# Patient Record
Sex: Male | Born: 1970
Health system: Southern US, Community
[De-identification: ages and names within clinical notes are randomized; demographics above are authoritative.]

## PROBLEM LIST (undated history)

## (undated) DIAGNOSIS — L409 Psoriasis, unspecified: Secondary | ICD-10-CM

## (undated) DIAGNOSIS — R7401 Elevation of levels of liver transaminase levels: Secondary | ICD-10-CM

## (undated) DIAGNOSIS — R17 Unspecified jaundice: Secondary | ICD-10-CM

## (undated) DIAGNOSIS — K7010 Alcoholic hepatitis without ascites: Secondary | ICD-10-CM

## (undated) DIAGNOSIS — K7460 Unspecified cirrhosis of liver: Secondary | ICD-10-CM

## (undated) DIAGNOSIS — I10 Essential (primary) hypertension: Secondary | ICD-10-CM

## (undated) DIAGNOSIS — M1990 Unspecified osteoarthritis, unspecified site: Secondary | ICD-10-CM

## (undated) DIAGNOSIS — D649 Anemia, unspecified: Secondary | ICD-10-CM

## (undated) DIAGNOSIS — R109 Unspecified abdominal pain: Secondary | ICD-10-CM

## (undated) DIAGNOSIS — F419 Anxiety disorder, unspecified: Secondary | ICD-10-CM

## (undated) HISTORY — DX: Psoriasis, unspecified: L40.9

## (undated) HISTORY — DX: Anxiety disorder, unspecified: F41.9

## (undated) HISTORY — DX: Elevation of levels of liver transaminase levels: R74.01

## (undated) HISTORY — PX: COLONOSCOPY: SHX174

## (undated) HISTORY — PX: NO PAST SURGERIES: SHX2092

## (undated) HISTORY — DX: Unspecified osteoarthritis, unspecified site: M19.90

## (undated) HISTORY — DX: Unspecified cirrhosis of liver: K74.60

---

## 2002-04-24 ENCOUNTER — Ambulatory Visit (HOSPITAL_COMMUNITY): Admission: RE | Admit: 2002-04-24 | Discharge: 2002-04-24 | Payer: Self-pay | Admitting: Family Medicine

## 2002-04-24 ENCOUNTER — Encounter: Payer: Self-pay | Admitting: Family Medicine

## 2005-05-22 ENCOUNTER — Emergency Department (HOSPITAL_COMMUNITY): Admission: EM | Admit: 2005-05-22 | Discharge: 2005-05-22 | Payer: Self-pay | Admitting: Emergency Medicine

## 2007-08-12 ENCOUNTER — Ambulatory Visit (HOSPITAL_COMMUNITY): Admission: RE | Admit: 2007-08-12 | Discharge: 2007-08-12 | Payer: Self-pay | Admitting: Family Medicine

## 2012-05-02 ENCOUNTER — Ambulatory Visit (INDEPENDENT_AMBULATORY_CARE_PROVIDER_SITE_OTHER): Payer: BC Managed Care – PPO | Admitting: Family Medicine

## 2012-05-02 ENCOUNTER — Encounter: Payer: Self-pay | Admitting: Family Medicine

## 2012-05-02 VITALS — BP 140/94 | HR 88 | Resp 16 | Ht 69.0 in | Wt 213.0 lb

## 2012-05-02 DIAGNOSIS — L409 Psoriasis, unspecified: Secondary | ICD-10-CM

## 2012-05-02 DIAGNOSIS — L408 Other psoriasis: Secondary | ICD-10-CM

## 2012-05-02 DIAGNOSIS — E669 Obesity, unspecified: Secondary | ICD-10-CM

## 2012-05-02 DIAGNOSIS — F17200 Nicotine dependence, unspecified, uncomplicated: Secondary | ICD-10-CM

## 2012-05-02 DIAGNOSIS — Z720 Tobacco use: Secondary | ICD-10-CM

## 2012-05-02 MED ORDER — CLOBETASOL PROPIONATE 0.05 % EX CREA: TOPICAL_CREAM | Freq: Two times a day (BID) | CUTANEOUS | Status: DC

## 2012-05-02 NOTE — Patient Instructions (Signed)
Referral to Dermatology  Start cream twice a day Schedule a physical for morning appointment- come fasting- 3 months

## 2012-05-04 DIAGNOSIS — Z720 Tobacco use: Secondary | ICD-10-CM | POA: Insufficient documentation

## 2012-05-04 DIAGNOSIS — L409 Psoriasis, unspecified: Secondary | ICD-10-CM | POA: Insufficient documentation

## 2012-05-04 DIAGNOSIS — E669 Obesity, unspecified: Secondary | ICD-10-CM | POA: Insufficient documentation

## 2012-05-04 DIAGNOSIS — L404 Guttate psoriasis: Secondary | ICD-10-CM | POA: Insufficient documentation

## 2012-05-04 DIAGNOSIS — L4050 Arthropathic psoriasis, unspecified: Secondary | ICD-10-CM | POA: Insufficient documentation

## 2012-05-04 NOTE — Assessment & Plan Note (Signed)
Discussed need for cessation, he does not want to try meds due to SE, he has tried patches in past, he is thinking of electronic cig

## 2012-05-04 NOTE — Progress Notes (Signed)
  Subjective:    Patient ID: Christopher Carrillo, male    DOB: 1971-03-02, 42 y.o.   MRN: [id number]  HPI Pt here to establish care, previous PCP [facility] No current medications He owns [employer]  Rash- for the past year he has had numerous red and silver patches come up on his body, initially started as a few spots on arm and knees, he then noticed swelling of his hands and feet and soon after he had the rash all over his thighs and buttucks, +itching, and painful at times He was given a fungal pill in the past that did not help, he has used some topical steroids from his mother and another medicine who also has   Not helped very much   Review of Systems  - per above   GEN- denies fatigue, fever, weight loss,weakness, recent illness HEENT- denies eye drainage, change in vision, nasal discharge, CVS- denies chest pain, palpitations RESP- denies SOB, cough, wheeze ABD- denies N/V, change in stools, abd pain GU- denies dysuria, hematuria, dribbling, incontinence MSK- denies joint pain, muscle aches, injury Neuro- denies headache, dizziness, syncope, seizure activity      Objective:   Physical Exam  GEN- NAD, alert and oriented x3 HEENT- PERRL, EOMI, non injected sclera, pink conjunctiva, MMM, oropharynx clear Neck- Supple, no LAD CVS- RRR, no murmur RESP-CTAB ABD-NABS,soft,NT,ND EXT- No edema Skin- Mutiple large erythematous rasied scaly plaques on buttocks, thighs, white pearly appearing plaque bilateral knees, small dime size raised plaques on bilat arms, scalp spared, face spared Pulses- Radial, DP- 2+       Assessment & Plan:

## 2012-05-04 NOTE — Assessment & Plan Note (Signed)
This appears like a a severe psoriasis

## 2012-05-04 NOTE — Assessment & Plan Note (Signed)
The appearance looks like a guttate psoroasis on his buttocks and legs, the knees look more classis with the white plaques, I will give him clobetasol today, refer to dermatology for biopsy and definite diagnosis. He will need CPE with FLP

## 2012-07-31 ENCOUNTER — Ambulatory Visit: Payer: BC Managed Care – PPO | Admitting: Family Medicine

## 2012-08-07 ENCOUNTER — Ambulatory Visit (INDEPENDENT_AMBULATORY_CARE_PROVIDER_SITE_OTHER): Payer: BC Managed Care – PPO | Admitting: Family Medicine

## 2012-08-07 ENCOUNTER — Encounter: Payer: Self-pay | Admitting: Family Medicine

## 2012-08-07 VITALS — BP 140/86 | HR 88 | Resp 16 | Wt 212.1 lb

## 2012-08-07 DIAGNOSIS — Z1211 Encounter for screening for malignant neoplasm of colon: Secondary | ICD-10-CM

## 2012-08-07 DIAGNOSIS — Z125 Encounter for screening for malignant neoplasm of prostate: Secondary | ICD-10-CM

## 2012-08-07 DIAGNOSIS — Z720 Tobacco use: Secondary | ICD-10-CM

## 2012-08-07 DIAGNOSIS — R030 Elevated blood-pressure reading, without diagnosis of hypertension: Secondary | ICD-10-CM

## 2012-08-07 DIAGNOSIS — L409 Psoriasis, unspecified: Secondary | ICD-10-CM

## 2012-08-07 DIAGNOSIS — F17200 Nicotine dependence, unspecified, uncomplicated: Secondary | ICD-10-CM

## 2012-08-07 DIAGNOSIS — Z0000 Encounter for general adult medical examination without abnormal findings: Secondary | ICD-10-CM

## 2012-08-07 DIAGNOSIS — L408 Other psoriasis: Secondary | ICD-10-CM

## 2012-08-07 DIAGNOSIS — E669 Obesity, unspecified: Secondary | ICD-10-CM

## 2012-08-07 LAB — CBC
HCT: 47.3 % (ref 39.0–52.0)
Hemoglobin: 16.7 g/dL (ref 13.0–17.0)
MCH: 32.6 pg (ref 26.0–34.0)
MCV: 92.4 fL (ref 78.0–100.0)
RBC: 5.12 MIL/uL (ref 4.22–5.81)

## 2012-08-07 NOTE — Patient Instructions (Addendum)
I recommend eye visit once a year I recommend dental visit every 6 months Goal is to  Exercise 30 minutes 5 days a week We will send a letter with lab results if normal I will call about the psoriasis F/U 3 months [name] for blood pressure recheck

## 2012-08-07 NOTE — Assessment & Plan Note (Signed)
Improves some with topical but difficult to place everywhere will obtain note, discuss with derm next step

## 2012-08-07 NOTE — Assessment & Plan Note (Signed)
Pt not ready to quit, counseled on cessation

## 2012-08-07 NOTE — Assessment & Plan Note (Signed)
Weight unchanged, increase activity, short term goals set

## 2012-08-07 NOTE — Progress Notes (Signed)
  Subjective:    Patient ID: Christopher Carrillo, male    DOB: March 22, 1971, 42 y.o.   MRN: [id number]  HPI Pt here for CPE, fasting labs to be done, immunizations UTD Seen by derm diagnosis psoriasis, prescribed same clobetasol cream but difficult to put everywhere as it is spreading.  Seen by [place] due to increased pressures in eyes He has been checking BP randomly, last nigh 122/90  Review of Systems  GEN- denies fatigue, fever, weight loss,weakness, recent illness HEENT- denies eye drainage, change in vision, nasal discharge, CVS- denies chest pain, palpitations RESP- denies SOB, cough, wheeze ABD- denies N/V, change in stools, abd pain GU- denies dysuria, hematuria, dribbling, incontinence MSK- denies joint pain, muscle aches, injury Neuro- denies headache, dizziness, syncope, seizure activity      Objective:   Physical Exam GEN- NAD, alert and oriented x3 HEENT- PERRL, EOMI, non injected sclera, pink conjunctiva, MMM, oropharynx clear Neck- Supple,  CVS- RRR, no murmur RESP-CTAB ABD-NABS,soft,NT,ND GU- Rectum- normal external appearance, no skin tags, FOBT neg, prostate smooth, no nodules EXT- No edema Pulses- Radial, DP- 2+ Skin- multiple psoriatric lesions bilat buttocks, legs, elbows       Assessment & Plan:

## 2012-08-07 NOTE — Assessment & Plan Note (Signed)
Monitor at home, discussed weight loss,dietary changes, increase activity

## 2012-08-07 NOTE — Assessment & Plan Note (Signed)
CPE Done TDAP UTD Discussed PSA testing pt agreed to testing Fasting labs See instructions

## 2012-08-08 LAB — LIPID PANEL
Cholesterol: 247 mg/dL — ABNORMAL HIGH (ref 0–200)
HDL: 47 mg/dL (ref >39)
LDL Cholesterol: 155 mg/dL — ABNORMAL HIGH (ref 0–99)
Total CHOL/HDL Ratio: 5.3 Ratio
Triglycerides: 226 mg/dL — ABNORMAL HIGH (ref <150)
VLDL: 45 mg/dL — ABNORMAL HIGH (ref 0–40)

## 2012-08-08 LAB — COMPREHENSIVE METABOLIC PANEL
ALT: 24 U/L (ref 0–53)
AST: 15 U/L (ref 0–37)
Albumin: 4.6 g/dL (ref 3.5–5.2)
Alkaline Phosphatase: 89 U/L (ref 39–117)
BUN: 13 mg/dL (ref 6–23)
CO2: 26 mEq/L (ref 19–32)
Calcium: 9.3 mg/dL (ref 8.4–10.5)
Chloride: 100 mEq/L (ref 96–112)
Creat: 0.76 mg/dL (ref 0.50–1.35)
Glucose, Bld: 81 mg/dL (ref 70–99)
Potassium: 5 mEq/L (ref 3.5–5.3)
Sodium: 140 mEq/L (ref 135–145)
Total Bilirubin: 0.5 mg/dL (ref 0.3–1.2)
Total Protein: 7.3 g/dL (ref 6.0–8.3)

## 2012-08-08 LAB — PSA: PSA: 0.86 ng/mL (ref <=4.00)

## 2012-08-08 MED ORDER — SIMVASTATIN 10 MG PO TABS: 10.0000 mg | ORAL_TABLET | Freq: Every evening | ORAL | Status: DC

## 2012-08-08 NOTE — Addendum Note (Signed)
Addended by: Vic Blackbird F on: 08/08/2012 09:14 AM   Modules accepted: Orders

## 2012-08-14 ENCOUNTER — Telehealth: Payer: Self-pay | Admitting: Family Medicine

## 2012-08-14 NOTE — Telephone Encounter (Signed)
I spoke with patient regarding his psoriasis. I also spoken with the dermatologist Dr. Rozann Lesches today recommend that he return to the office to starting oral medications or injections for his psoriasis he would like to start the injections he will call and schedule an appointment

## 2012-11-18 ENCOUNTER — Ambulatory Visit: Payer: Self-pay | Admitting: Family Medicine

## 2012-12-19 ENCOUNTER — Encounter: Payer: Self-pay | Admitting: Family Medicine

## 2012-12-19 ENCOUNTER — Ambulatory Visit (INDEPENDENT_AMBULATORY_CARE_PROVIDER_SITE_OTHER): Payer: No Typology Code available for payment source | Admitting: Family Medicine

## 2012-12-19 VITALS — BP 154/90 | HR 84 | Temp 97.7°F | Resp 20 | Wt 208.0 lb

## 2012-12-19 DIAGNOSIS — I10 Essential (primary) hypertension: Secondary | ICD-10-CM

## 2012-12-19 DIAGNOSIS — F17200 Nicotine dependence, unspecified, uncomplicated: Secondary | ICD-10-CM

## 2012-12-19 DIAGNOSIS — E669 Obesity, unspecified: Secondary | ICD-10-CM

## 2012-12-19 DIAGNOSIS — E785 Hyperlipidemia, unspecified: Secondary | ICD-10-CM

## 2012-12-19 DIAGNOSIS — Z720 Tobacco use: Secondary | ICD-10-CM

## 2012-12-19 LAB — CBC
HCT: 48 % (ref 39.0–52.0)
Hemoglobin: 17 g/dL (ref 13.0–17.0)
MCHC: 35.4 g/dL (ref 30.0–36.0)
WBC: 5.8 10*3/uL (ref 4.0–10.5)

## 2012-12-19 LAB — COMPREHENSIVE METABOLIC PANEL
AST: 27 U/L (ref 0–37)
BUN: 13 mg/dL (ref 6–23)
Calcium: 9.1 mg/dL (ref 8.4–10.5)
Chloride: 102 mEq/L (ref 96–112)
Creat: 0.96 mg/dL (ref 0.50–1.35)
Total Bilirubin: 0.5 mg/dL (ref 0.3–1.2)

## 2012-12-19 LAB — LIPID PANEL
Cholesterol: 257 mg/dL — ABNORMAL HIGH (ref 0–200)
HDL: 35 mg/dL — ABNORMAL LOW (ref >39)
Triglycerides: 689 mg/dL — ABNORMAL HIGH (ref <150)

## 2012-12-19 MED ORDER — HYDROCHLOROTHIAZIDE 12.5 MG PO TABS: 12.5000 mg | ORAL_TABLET | Freq: Every day | ORAL | Status: DC

## 2012-12-19 NOTE — Progress Notes (Signed)
  Subjective:    Patient ID: Christopher Carrillo, male    DOB: 04-Oct-1970, 42 y.o.   MRN: [id number]  HPI  Pt here to f/u chronic medical problems.  Elevated BP- denies Headache SOB. Has not checked BP at pharmacy recently Hyperlipidemia- elevated cholesterol 4 months, pt declined meds and wanted to work on diet, here for recheck today Declines flu shot Psoriasis- following with dermatology, has not started any other treatments, told he may have eczema vs psoriasis via a biopsy Runny nose past few days, no cough, no fever.  Review of Systems  GEN- denies fatigue, fever, weight loss,weakness, recent illness HEENT- denies eye drainage, change in vision, +nasal discharge, CVS- denies chest pain, palpitations RESP- denies SOB, cough, wheeze ABD- denies N/V, change in stools, abd pain GU- denies dysuria, hematuria, dribbling, incontinence MSK- denies joint pain, muscle aches, injury Neuro- denies headache, dizziness, syncope, seizure activity      Objective:   Physical Exam GEN- NAD, alert and oriented x3, initial BP 160/88 HEENT- PERRL, EOMI, non injected sclera, pink conjunctiva, MMM, oropharynx clear, TM clear bilat, no effusion, nares clear rhinorrhea, no maxillary sinus tenderness Neck- Supple, no LAD CVS- RRR, no murmur RESP-CTAB EXT- No edema Pulses- Radial 2+ Skin- multiple psoriatic lesions, legs, arms,        Assessment & Plan:

## 2012-12-19 NOTE — Assessment & Plan Note (Signed)
Check FLP, then decide on meds

## 2012-12-19 NOTE — Assessment & Plan Note (Signed)
Start HCTZ once a day, f/u 2 months Decrease Gatorade and salt consumption Work on weight loss

## 2012-12-19 NOTE — Assessment & Plan Note (Signed)
counseled on cessation he is not ready to quit

## 2012-12-19 NOTE — Patient Instructions (Signed)
Try 2 meals a day Increase activity to 30 minutes 5 ays a week Start blood pressure medication F/U 2 months

## 2012-12-19 NOTE — Assessment & Plan Note (Signed)
Discussed weight, next goal is < 200lbs, increase activity, and work on diet, he often eats 1 large meal a day

## 2012-12-22 MED ORDER — GEMFIBROZIL 600 MG PO TABS: 600.0000 mg | ORAL_TABLET | Freq: Two times a day (BID) | ORAL | Status: DC

## 2012-12-22 NOTE — Addendum Note (Signed)
Addended by: Vic Blackbird F on: 12/22/2012 05:22 PM   Modules accepted: Orders

## 2013-03-17 ENCOUNTER — Telehealth: Payer: Self-pay | Admitting: Family Medicine

## 2013-03-17 NOTE — Telephone Encounter (Signed)
C/O chest pain wants appt here today.  Seen [facility] yesterday and they said was GERD. Given OTC Omeprazole.  Still has today, feels somewhat worse and different.  Told patient really should go to ED not urgent care for Cardiac work up to rule out cardiac problem.  If truly GERD can take several day for PPI to be effective.  Strongly encouraged to go to ED today, NOW.  Wife agrees and will take patient there.

## 2013-03-17 NOTE — Telephone Encounter (Signed)
Agree with above 

## 2013-04-07 ENCOUNTER — Ambulatory Visit (INDEPENDENT_AMBULATORY_CARE_PROVIDER_SITE_OTHER): Payer: No Typology Code available for payment source | Admitting: Family Medicine

## 2013-04-07 ENCOUNTER — Encounter: Payer: Self-pay | Admitting: Family Medicine

## 2013-04-07 VITALS — BP 118/80 | HR 88 | Temp 98.2°F | Resp 18 | Ht 69.0 in | Wt 210.0 lb

## 2013-04-07 DIAGNOSIS — R159 Full incontinence of feces: Secondary | ICD-10-CM

## 2013-04-07 DIAGNOSIS — L409 Psoriasis, unspecified: Secondary | ICD-10-CM

## 2013-04-07 DIAGNOSIS — E785 Hyperlipidemia, unspecified: Secondary | ICD-10-CM

## 2013-04-07 DIAGNOSIS — K219 Gastro-esophageal reflux disease without esophagitis: Secondary | ICD-10-CM

## 2013-04-07 DIAGNOSIS — L408 Other psoriasis: Secondary | ICD-10-CM

## 2013-04-07 DIAGNOSIS — R0789 Other chest pain: Secondary | ICD-10-CM

## 2013-04-07 DIAGNOSIS — I10 Essential (primary) hypertension: Secondary | ICD-10-CM

## 2013-04-07 DIAGNOSIS — R109 Unspecified abdominal pain: Secondary | ICD-10-CM | POA: Insufficient documentation

## 2013-04-07 MED ORDER — PANTOPRAZOLE SODIUM 40 MG PO TBEC: 40.0000 mg | DELAYED_RELEASE_TABLET | Freq: Every day | ORAL | Status: DC

## 2013-04-07 NOTE — Patient Instructions (Signed)
I will call with results of blood work Start the protonix once a day  Eat healthy- Increase water,fresh fruits and veggies Ultrasound may be needed F/U pending results

## 2013-04-08 DIAGNOSIS — K219 Gastro-esophageal reflux disease without esophagitis: Secondary | ICD-10-CM | POA: Insufficient documentation

## 2013-04-08 DIAGNOSIS — R0789 Other chest pain: Secondary | ICD-10-CM | POA: Insufficient documentation

## 2013-04-08 DIAGNOSIS — R159 Full incontinence of feces: Secondary | ICD-10-CM | POA: Insufficient documentation

## 2013-04-08 LAB — COMPREHENSIVE METABOLIC PANEL
ALT: 46 U/L (ref 0–53)
AST: 32 U/L (ref 0–37)
Alkaline Phosphatase: 95 U/L (ref 39–117)
Creat: 0.7 mg/dL (ref 0.50–1.35)
Total Bilirubin: 0.7 mg/dL (ref 0.3–1.2)

## 2013-04-08 LAB — CBC WITH DIFFERENTIAL/PLATELET
Basophils Absolute: 0.1 10*3/uL (ref 0.0–0.1)
Basophils Relative: 1 % (ref 0–1)
Hemoglobin: 16.9 g/dL (ref 13.0–17.0)
Lymphocytes Relative: 29 % (ref 12–46)
Lymphs Abs: 2.6 10*3/uL (ref 0.7–4.0)
MCHC: 35.5 g/dL (ref 30.0–36.0)
Monocytes Absolute: 1.1 10*3/uL — ABNORMAL HIGH (ref 0.1–1.0)
Monocytes Relative: 13 % — ABNORMAL HIGH (ref 3–12)
Neutro Abs: 5.1 10*3/uL (ref 1.7–7.7)
Neutrophils Relative %: 55 % (ref 43–77)
RDW: 14 % (ref 11.5–15.5)
WBC: 9 10*3/uL (ref 4.0–10.5)

## 2013-04-08 LAB — LIPID PANEL
HDL: 49 mg/dL (ref >39)
LDL Cholesterol: 171 mg/dL — ABNORMAL HIGH (ref 0–99)
Triglycerides: 181 mg/dL — ABNORMAL HIGH (ref <150)
VLDL: 36 mg/dL (ref 0–40)

## 2013-04-08 LAB — LIPASE: Lipase: 24 U/L (ref 0–75)

## 2013-04-08 LAB — H. PYLORI ANTIBODY, IGG: H Pylori IgG: 0.44 {ISR}

## 2013-04-08 MED ORDER — FENOFIBRATE 145 MG PO TABS: 145.0000 mg | ORAL_TABLET | Freq: Every day | ORAL | Status: DC

## 2013-04-08 NOTE — Assessment & Plan Note (Signed)
EKG is reassuring. I think that his chest discomfort is due to gas and GI dysfunction

## 2013-04-08 NOTE — Assessment & Plan Note (Signed)
His psoriatic lesions improved at times. He does get some swelling in his joints which makes me concerned that he has  psoriatic arthritis in his hands but he does not want to pursue rheumatology at this time

## 2013-04-08 NOTE — Assessment & Plan Note (Signed)
Will start protonic 40 mg once a day and see how he does with this

## 2013-04-08 NOTE — Assessment & Plan Note (Signed)
Recheck fasting lipid panel in his triglyceride

## 2013-04-08 NOTE — Assessment & Plan Note (Signed)
He has some epigastric pain. I will check H. pylori as well as lipase. His triglycerides have been very elevated in the past He may need right upper quadrant ultrasound

## 2013-04-08 NOTE — Assessment & Plan Note (Signed)
Blood pressure is well-controlled off medication

## 2013-04-08 NOTE — Assessment & Plan Note (Signed)
She's had some mild rectal leakage. He has normal rectal tone and exam I see no blood. He's not had any back or spinal cord injury. We may need gastroenterology involvement To work on dietary changes as well  We'll followup labs first

## 2013-04-08 NOTE — Progress Notes (Signed)
   Subjective:    Patient ID: Christopher Carrillo, male    DOB: [birth year], 42 y.o.   MRN: [id number]  HPI Patient here with multiple concerns. He's had chest pain on and off for the past couple weeks. He was evaluated at urgent care and was told that he had acid reflux. His pain is described as a pressure-like feeling he states he gets this after drinking alcohol. He has a pressure feeling that goes up to his neck and between his shoulder blades. He also has epigastric pain on and off, sometimes associated with meals. Belching does help. He does get pain after some meals but other times it occurs at rest. He's not had any diaphoresis or shortness of breath associated. He tried taking some times but this did not help.  He did not take his pressure medication is been trying to work on his diet. He also did not start the Lopid for his elevated triglycerides which were in the 600s.  Regarding his psoriasis he continues to use his topical cream as needed he does get some joint swelling in his hands and the dermatologist told him that he may have some psoriatic arthritis he also gets some pain in his elbows at times.  He's had some loose stools on and off for the past 6 months. He states his stools are yellow in color. At times he has had leakage of stool. He denies any constipation. He denies any blood in the stools. He is also noted on 2 occasions that he has had yellow like sperm.   Review of Systems  GEN- denies fatigue, fever, weight loss,weakness, recent illness HEENT- denies eye drainage, change in vision, nasal discharge, CVS- denies chest pain, palpitations RESP- denies SOB, cough, wheeze ABD- denies N/V, change in stools, abd pain GU- denies dysuria, hematuria, dribbling, incontinence MSK- denies joint pain, muscle aches, injury Neuro- denies headache, dizziness, syncope, seizure activity      Objective:   Physical Exam GEN- NAD, alert and oriented x3 HEENT- PERRL, EOMI, non injected  sclera, pink conjunctiva, MMM, oropharynx clear CVS- RRR, no murmur RESP-CTAB ABD-NABS,soft,NT,ND EXT- No edema Rectum- normal tone, soft brown stool Pulses- Radial, DP- 2+ Skin- psoriatic lesions, elbows, legs, hands,  MSK- mild swelling of MIP bilat hands  EKG-NSR, no ST changes, ? Left fasicular block -- No comparison     Assessment & Plan:

## 2013-04-30 ENCOUNTER — Encounter: Payer: Self-pay | Admitting: Family Medicine

## 2013-06-17 ENCOUNTER — Ambulatory Visit (INDEPENDENT_AMBULATORY_CARE_PROVIDER_SITE_OTHER): Payer: No Typology Code available for payment source | Admitting: Family Medicine

## 2013-06-17 ENCOUNTER — Encounter: Payer: Self-pay | Admitting: Family Medicine

## 2013-06-17 VITALS — BP 138/76 | HR 80 | Temp 98.1°F | Resp 14 | Ht 68.0 in | Wt 206.0 lb

## 2013-06-17 DIAGNOSIS — I10 Essential (primary) hypertension: Secondary | ICD-10-CM

## 2013-06-17 DIAGNOSIS — E669 Obesity, unspecified: Secondary | ICD-10-CM

## 2013-06-17 DIAGNOSIS — E785 Hyperlipidemia, unspecified: Secondary | ICD-10-CM

## 2013-06-17 DIAGNOSIS — K219 Gastro-esophageal reflux disease without esophagitis: Secondary | ICD-10-CM

## 2013-06-17 MED ORDER — SIMVASTATIN 20 MG PO TABS: 20.0000 mg | ORAL_TABLET | Freq: Every day | ORAL | Status: DC

## 2013-06-17 MED ORDER — PANTOPRAZOLE SODIUM 40 MG PO TBEC: 40.0000 mg | DELAYED_RELEASE_TABLET | Freq: Every day | ORAL | Status: DC

## 2013-06-17 MED ORDER — CLOBETASOL PROPIONATE 0.05 % EX CREA: TOPICAL_CREAM | Freq: Two times a day (BID) | CUTANEOUS | Status: DC

## 2013-06-17 NOTE — Assessment & Plan Note (Signed)
Discussed dietary changes and exercise,short term goals set

## 2013-06-17 NOTE — Progress Notes (Signed)
Patient ID: Christopher Carrillo, male   DOB: [birth year], 43 y.o.   MRN: [id number]     Subjective:    Patient ID: Christopher Carrillo, male    DOB: 1970/11/10, 43 y.o.   MRN: [id number]  Patient presents for F/U from Dec 2014  patient here to follow chronic medical problems. He did take the TriCor for one month because of the cost states it was $134. His triglycerides had improved some. He is doing well off of his blood pressure medications. His abdominal pain and change in his stools has improved with the use of proton X. and he would like this refilled. He has no new concerns today    Review Of Systems:  GEN- denies fatigue, fever, weight loss,weakness, recent illness HEENT- denies eye drainage, change in vision, nasal discharge, CVS- denies chest pain, palpitations RESP- denies SOB, cough, wheeze ABD- denies N/V, change in stools, abd pain GU- denies dysuria, hematuria, dribbling, incontinence MSK- denies joint pain, muscle aches, injury Neuro- denies headache, dizziness, syncope, seizure activity       Objective:    BP 138/76  Pulse 80  Temp(Src) 98.1 F (36.7 C)  Resp 14  Ht 5' 8"  (1.727 m)  Wt 206 lb (93.441 kg)  BMI 31.33 kg/m2 GEN- NAD, alert and oriented x3 HEENT- PERRL, EOMI, non injected sclera, pink conjunctiva, MMM, oropharynx clear CVS- RRR, no murmur RESP-CTAB ABD-NABS,soft,NT,ND EXT- No edema Pulses- Radial 2+        Assessment & Plan:      Problem List Items Addressed This Visit   None      Note: This dictation was prepared with Dragon dictation along with smaller phrase technology. Any transcriptional errors that result from this process are unintentional.

## 2013-06-17 NOTE — Assessment & Plan Note (Signed)
Hypertriglyceridemia noted as well as increased LDL, unable to afford the tricor, as TG now below 300 With his LDL up, will

## 2013-06-17 NOTE — Assessment & Plan Note (Addendum)
Continue protonix- symptoms improved

## 2013-06-17 NOTE — Assessment & Plan Note (Signed)
Blood pressure still looks good off medications we'll continue to monitor

## 2013-06-17 NOTE — Patient Instructions (Signed)
Continue current medications We will call with new medication Come at end of May for labs  F/U 6 months

## 2013-08-25 ENCOUNTER — Encounter: Payer: Self-pay | Admitting: *Deleted

## 2013-09-07 ENCOUNTER — Other Ambulatory Visit: Payer: No Typology Code available for payment source

## 2013-09-07 DIAGNOSIS — E785 Hyperlipidemia, unspecified: Secondary | ICD-10-CM

## 2013-09-07 LAB — LIPID PANEL
CHOL/HDL RATIO: 4.7 ratio
Cholesterol: 242 mg/dL — ABNORMAL HIGH (ref 0–200)
HDL: 51 mg/dL (ref >39)
LDL CALC: 161 mg/dL — AB (ref 0–99)
Triglycerides: 149 mg/dL (ref <150)
VLDL: 30 mg/dL (ref 0–40)

## 2013-09-07 LAB — COMPREHENSIVE METABOLIC PANEL
ALK PHOS: 79 U/L (ref 39–117)
ALT: 30 U/L (ref 0–53)
AST: 22 U/L (ref 0–37)
Albumin: 4.3 g/dL (ref 3.5–5.2)
BILIRUBIN TOTAL: 0.5 mg/dL (ref 0.2–1.2)
BUN: 10 mg/dL (ref 6–23)
CO2: 25 mEq/L (ref 19–32)
CREATININE: 0.77 mg/dL (ref 0.50–1.35)
Calcium: 9.7 mg/dL (ref 8.4–10.5)
Chloride: 103 mEq/L (ref 96–112)
Glucose, Bld: 105 mg/dL — ABNORMAL HIGH (ref 70–99)
Potassium: 4.4 mEq/L (ref 3.5–5.3)
Sodium: 141 mEq/L (ref 135–145)
TOTAL PROTEIN: 7.1 g/dL (ref 6.0–8.3)

## 2013-09-10 ENCOUNTER — Encounter: Payer: Self-pay | Admitting: *Deleted

## 2014-03-29 ENCOUNTER — Telehealth: Payer: Self-pay | Admitting: Family Medicine

## 2014-03-29 NOTE — Telephone Encounter (Signed)
Returned call to patient wife.   Reports that patient had labs obtained on 03/19/2014. Advised that labs were sent to scanning center and will mail copy to patient when labs are in chart.

## 2014-03-29 NOTE — Telephone Encounter (Signed)
[phone] Patients wife Marita [place] calling to see if dr Buelah Manis received the test results from the fertility doctor they have been going to, if so can we mail them a copy of this

## 2014-03-30 ENCOUNTER — Encounter: Payer: Self-pay | Admitting: Family Medicine

## 2014-04-05 ENCOUNTER — Telehealth: Payer: Self-pay | Admitting: Family Medicine

## 2014-04-05 NOTE — Telephone Encounter (Signed)
Patient went on mychart for lab results but would like someone to call him and go over these results with him if possible  [phone]

## 2014-04-05 NOTE — Telephone Encounter (Signed)
Call placed to patient wife.   States that she can see semen analysis on MyChart. Reports that she can't read result d/t blurriness.   Advised that results states decreased motility and increased abnormal morphology. Advised to F/U with urology for more detailed explanation.

## 2014-07-21 ENCOUNTER — Other Ambulatory Visit: Payer: Self-pay | Admitting: Family Medicine

## 2014-07-21 NOTE — Telephone Encounter (Signed)
Medication filled x1 with no refills.   Requires office visit before any further refills can be given.   Letter sent.  

## 2015-04-13 ENCOUNTER — Ambulatory Visit: Payer: No Typology Code available for payment source | Admitting: Family Medicine

## 2016-06-18 ENCOUNTER — Ambulatory Visit (INDEPENDENT_AMBULATORY_CARE_PROVIDER_SITE_OTHER): Payer: 59 | Admitting: Physician Assistant

## 2016-06-18 ENCOUNTER — Encounter: Payer: Self-pay | Admitting: Physician Assistant

## 2016-06-18 VITALS — BP 138/88 | HR 103 | Temp 100.3°F | Resp 16 | Wt 202.8 lb

## 2016-06-18 DIAGNOSIS — J111 Influenza due to unidentified influenza virus with other respiratory manifestations: Secondary | ICD-10-CM | POA: Diagnosis not present

## 2016-06-18 DIAGNOSIS — J988 Other specified respiratory disorders: Secondary | ICD-10-CM

## 2016-06-18 DIAGNOSIS — B9789 Other viral agents as the cause of diseases classified elsewhere: Secondary | ICD-10-CM

## 2016-06-18 MED ORDER — OSELTAMIVIR PHOSPHATE 75 MG PO CAPS: 75.0000 mg | ORAL_CAPSULE | Freq: Two times a day (BID) | ORAL | 0 refills | Status: DC

## 2016-06-18 NOTE — Progress Notes (Signed)
Patient ID: Christopher Carrillo MRN: [id number], DOB: 04-29-1970, 46 y.o. Date of Encounter: 06/18/2016, 8:48 AM    Chief Complaint:  Chief Complaint  Patient presents with  . Cough    x1day  . Generalized Body Aches  . Headache     HPI: 46 y.o. year old male presents with above.   Symptoms started yesterday.Had raspy cough when woke up yesterday morning. Had increasing cough as the day progressed.  In the afternoon started feeling hot and achy all over. Did not get much sleep last night because feeling diffuse body aches. No sore throat. Does feel congestion in his head and nose and does have headache.     Home Meds:   Outpatient Medications Prior to Visit  Medication Sig Dispense Refill  . Ascorbic Acid (VITAMIN C) 1000 MG tablet Take 1,000 mg by mouth daily.    . Multiple Vitamin (MULTIVITAMIN) tablet Take 1 tablet by mouth daily.    . simvastatin (ZOCOR) 20 MG tablet Take 1 tablet (20 mg total) by mouth at bedtime. (Patient not taking: Reported on 06/18/2016) 30 tablet 3  . clobetasol cream (TEMOVATE) 0.05 % APPLY TO AFFECTED AREA TWICE A DAY 45 g 0  . pantoprazole (PROTONIX) 40 MG tablet Take 1 tablet (40 mg total) by mouth daily. 30 tablet 6   No facility-administered medications prior to visit.     Allergies: No Known Allergies    Review of Systems: See HPI for pertinent ROS. All other ROS negative.    Physical Exam: Blood pressure 138/88, pulse (!) 103, temperature 100.3 F (37.9 C), temperature source Oral, resp. rate 16, weight 202 lb 12.8 oz (92 kg), SpO2 93 %., Body mass index is 30.84 kg/m. General:  WNWD WM. Appears in no acute distress. HEENT: Normocephalic, atraumatic, eyes without discharge, sclera non-icteric, nares are without discharge. Bilateral auditory canals clear, TM's are without perforation, pearly grey and translucent with reflective cone of light bilaterally. Oral cavity moist, posterior pharynx without exudate, erythema, peritonsillar abscess.  No tenderness with percussion to frontal or maxillary sinuses bilaterally.  Neck: Supple. No thyromegaly. No lymphadenopathy. Lungs: Clear bilaterally to auscultation without wheezes, rales, or rhonchi. Breathing is unlabored. Heart: Regular rhythm. No murmurs, rubs, or gallops. Msk:  Strength and tone normal for age. Extremities/Skin: Warm and dry.  No rashes. Neuro: Alert and oriented X 3. Moves all extremities spontaneously. Gait is normal. CNII-XII grossly in tact. Psych:  Responds to questions appropriately with a normal affect.     ASSESSMENT AND PLAN:  46 y.o. year old male with  1. Viral respiratory infection  2. Influenza Definitely consistent with a virus of some sort. May be influenza so will go ahead and cover with Tamiflu. To take the Tamiflu as directed. Also can use over-the-counter Tylenol and NSAIDs to control aches and pains and fever. Use over-the-counter decongestants and cough medicines as needed for symptom relief. Follow up if symptoms worsen significantly or do not resolve in one week. Note given for out of work today and tomorrow. States that his wife has not been sick with any symptoms so far. She works as a respiratory therapist--do not want her spreading infection. She is a patient of ours. I have also sent an prescription for preventive dose of Tamiflu for her. This Christopher Carrillo date of birth [birth year]. - oseltamivir (TAMIFLU) 75 MG capsule; Take 1 capsule (75 mg total) by mouth 2 (two) times daily.  Dispense: 10 capsule; Refill: 0   Signed, [address] [place], [place],  [facility] 06/18/2016 8:48 AM

## 2016-06-20 ENCOUNTER — Telehealth: Payer: Self-pay | Admitting: Physician Assistant

## 2016-06-20 ENCOUNTER — Telehealth: Payer: Self-pay

## 2016-06-20 MED ORDER — HYDROCODONE-HOMATROPINE 5-1.5 MG/5ML PO SYRP: 5.0000 mL | ORAL_SOLUTION | Freq: Four times a day (QID) | ORAL | 0 refills | Status: DC | PRN

## 2016-06-20 NOTE — Telephone Encounter (Signed)
See note below pls advise

## 2016-06-20 NOTE — Telephone Encounter (Signed)
Pt was seen on Monday, and his fever has finally broke. But now is cough is worse and he wanted to know if we can call in something for the cough, preferably cough syrup and not pills.

## 2016-06-20 NOTE — Telephone Encounter (Signed)
Patient is asking for work note to cover him 3/15-3/16 and return 3/19 if possible?

## 2016-06-20 NOTE — Telephone Encounter (Signed)
The RX cough syrups have codeine in them so they have to be printed and picked up.  If he can drive here to pick up Rx, can Rx Hycodan---5 ml Q 6 hour prn cough # 120 ml + 0

## 2016-06-20 NOTE — Telephone Encounter (Signed)
Previous message was sent to [facility] regarding this patient request

## 2016-06-21 NOTE — Telephone Encounter (Signed)
Patient is aware that he can pick both the note and RX up after 2pm today

## 2016-06-21 NOTE — Telephone Encounter (Signed)
Approved.  

## 2017-01-07 ENCOUNTER — Ambulatory Visit (INDEPENDENT_AMBULATORY_CARE_PROVIDER_SITE_OTHER): Payer: 59 | Admitting: Family Medicine

## 2017-01-07 ENCOUNTER — Encounter: Payer: Self-pay | Admitting: Family Medicine

## 2017-01-07 VITALS — BP 134/86 | HR 104 | Temp 98.0°F | Resp 16 | Ht 69.0 in | Wt 202.0 lb

## 2017-01-07 DIAGNOSIS — R040 Epistaxis: Secondary | ICD-10-CM | POA: Diagnosis not present

## 2017-01-07 DIAGNOSIS — L409 Psoriasis, unspecified: Secondary | ICD-10-CM | POA: Diagnosis not present

## 2017-01-07 DIAGNOSIS — N529 Male erectile dysfunction, unspecified: Secondary | ICD-10-CM | POA: Diagnosis not present

## 2017-01-07 MED ORDER — SILDENAFIL CITRATE 100 MG PO TABS: 100.0000 mg | ORAL_TABLET | Freq: Every day | ORAL | 5 refills | Status: DC | PRN

## 2017-01-07 MED ORDER — MOMETASONE FUROATE 0.1 % EX CREA: 1.0000 "application " | TOPICAL_CREAM | Freq: Every day | CUTANEOUS | 0 refills | Status: DC

## 2017-01-07 NOTE — Progress Notes (Signed)
   Subjective:    Patient ID: Christopher Carrillo, male    DOB: [birth year], 46 y.o.   MRN: [id number]  HPI Patient states that earlier this week, he had an episode of epistaxis became to both nostrils after he sneezed hard. This is gradually been improving throughout the week and now he just has occasional blood-tinged rhinorrhea. He wears CPAP without a humidifier he believes this caused it. He also reports psoriasis. He has a coin-sized patch on his medial left thigh and would like a topical steroid that he could use sparingly to help treat this he also reports erectile dysfunction however he suffered severe headaches from Cialis and he would like to try different one Past Medical History:  Diagnosis Date  . Psoriasis    No past surgical history on file. Current Outpatient Prescriptions on File Prior to Visit  Medication Sig Dispense Refill  . Ascorbic Acid (VITAMIN C) 1000 MG tablet Take 1,000 mg by mouth daily.    . Multiple Vitamin (MULTIVITAMIN) tablet Take 1 tablet by mouth daily.     No current facility-administered medications on file prior to visit.    No Known Allergies Social History   Social History  . Marital status: Single    Spouse name: N/A  . Number of children: N/A  . Years of education: N/A   Occupational History  . Not on file.   Social History Main Topics  . Smoking status: Former Smoker    Packs/day: 0.50    Types: Cigarettes    Quit date: 03/20/2016  . Smokeless tobacco: Never Used  . Alcohol use Yes  . Drug use: No  . Sexual activity: Not on file   Other Topics Concern  . Not on file   Social History Narrative  . No narrative on file      Review of Systems  All other systems reviewed and are negative.      Objective:   Physical Exam  Constitutional: He appears well-developed and well-nourished.  HENT:  Nose: Mucosal edema and rhinorrhea present. No nose lacerations, nasal deformity or nasal septal hematoma. No epistaxis.  Cardiovascular:  Normal rate, regular rhythm and normal heart sounds.   Pulmonary/Chest: Effort normal and breath sounds normal.  Vitals reviewed.         Assessment & Plan:  Epistaxis  Erectile dysfunction, unspecified erectile dysfunction type  Psoriasis  Both nasal passages were examined with an otoscope. There was no obvious source of bleeding identified that would be amenable to cauterization. There is no active epistaxis and therefore packing is not required. I recommended using a humidifier with his CPAP to avoid mucosal irritation. Also recommended using Afrin twice a day for the next 3-4 days to prevent recurrent epistaxis and allow sufficient time for the irritation to clot and heel. I will treat the patient psoriasis with Elocon cream applied once daily for up to one week. I cautioned the patient about recurrent repeated use of steroids. I will treat his erectile dysfunction with Viagra 50-100 mg daily as needed.

## 2017-01-28 ENCOUNTER — Ambulatory Visit (INDEPENDENT_AMBULATORY_CARE_PROVIDER_SITE_OTHER): Payer: 59 | Admitting: Physician Assistant

## 2017-01-28 ENCOUNTER — Encounter: Payer: Self-pay | Admitting: Physician Assistant

## 2017-01-28 VITALS — BP 142/88 | HR 85 | Temp 97.4°F | Resp 16 | Ht 69.0 in | Wt 204.0 lb

## 2017-01-28 DIAGNOSIS — J988 Other specified respiratory disorders: Secondary | ICD-10-CM

## 2017-01-28 DIAGNOSIS — B9689 Other specified bacterial agents as the cause of diseases classified elsewhere: Principal | ICD-10-CM

## 2017-01-28 MED ORDER — AMOXICILLIN-POT CLAVULANATE 875-125 MG PO TABS: 1.0000 | ORAL_TABLET | Freq: Two times a day (BID) | ORAL | 0 refills | Status: AC

## 2017-01-28 NOTE — Progress Notes (Signed)
Patient ID: Christopher Carrillo MRN: [id number], DOB: Dec 22, 1970, 46 y.o. Date of Encounter: 01/28/2017, 12:13 PM    Chief Complaint:  Chief Complaint  Patient presents with  . Epistaxis     HPI: 46 y.o. year old male presents with above.   Today I reviewed his office note with Dr. Dennard Schaumann from 01/07/17 because one of the issues that he addressed at that visit as was epistaxis. That note reported that  earlier that week he had an episode of epistaxis affecting both nostrils after he sneezed hard. That had gradually improved throughout the week and at that time was just having occasional blood tinged rhinorrhea. Noted that he was wearing CPAP without a humidifier and felt that was contributing factor. Both nasal passages were examined. There was no obvious source of bleeding identified that would be amenable to cauterization. There was no active epistaxis so packing was not required. He recommended humidifier with CPAP to avoid mucosal irritation. Recommended using Afrin twice a day for 3 or 4 days to prevent recurrent epistaxis and allow sufficient time for irritation to clot and heal.  Today patient states that he has been having yellow-green mucus coming out of his nose and then after he gets this out then he sees a little bit of blood. States that the yellow-green has gotten thicker and thicker. It has been going on for 1-1/2 weeks. Says that today he was really concerned about this yellow-green mucus not really the blood at this point. He has been using the humidifier with the CPAP. Has had very minimal blood in the drainage. Has had no chest congestion. No cough-- if any, very minimal- just secondary to drainage down his throat. No sore throat. No fevers or chills.     Home Meds:   Outpatient Medications Prior to Visit  Medication Sig Dispense Refill  . Ascorbic Acid (VITAMIN C) 1000 MG tablet Take 1,000 mg by mouth daily.    . mometasone (ELOCON) 0.1 % cream Apply 1 application  topically daily. 45 g 0  . Multiple Vitamin (MULTIVITAMIN) tablet Take 1 tablet by mouth daily.    . sildenafil (VIAGRA) 100 MG tablet Take 1 tablet (100 mg total) by mouth daily as needed for erectile dysfunction. 10 tablet 5   No facility-administered medications prior to visit.     Allergies: No Known Allergies    Review of Systems: See HPI for pertinent ROS. All other ROS negative.    Physical Exam: Blood pressure (!) 142/88, pulse 85, temperature (!) 97.4 F (36.3 C), temperature source Oral, resp. rate 16, height 5' 9"  (1.753 m), weight 92.5 kg (204 lb), SpO2 98 %., Body mass index is 30.13 kg/m. General: WNWD WM.  Appears in no acute distress. HEENT: Normocephalic, atraumatic, eyes without discharge, sclera non-icteric, nares are without discharge. Bilateral auditory canals clear, TM's are without perforation, pearly grey and translucent with reflective cone of light bilaterally. Oral cavity moist, posterior pharynx without exudate, erythema, peritonsillar abscess. No tenderness with percussion to frontal or maxillary sinuses bilaterally.  Neck: Supple. No thyromegaly. No lymphadenopathy. Lungs: Clear bilaterally to auscultation without wheezes, rales, or rhonchi. Breathing is unlabored. Heart: Regular rhythm. No murmurs, rubs, or gallops. Msk:  Strength and tone normal for age. Extremities/Skin: Warm and dry. Neuro: Alert and oriented X 3. Moves all extremities spontaneously. Gait is normal. CNII-XII grossly in tact. Psych:  Responds to questions appropriately with a normal affect.     ASSESSMENT AND PLAN:  46 y.o. year old male with  1. Bacterial respiratory infection He is to take the Augmentin as directed. Follow-up if symptoms do not resolve in completion of this antibiotic. - amoxicillin-clavulanate (AUGMENTIN) 875-125 MG tablet; Take 1 tablet by mouth 2 (two) times daily.  Dispense: 14 tablet; Refill: 0   Signed, [address] [place], [place], [facility] 01/28/2017 12:13 PM

## 2017-02-11 DIAGNOSIS — J320 Chronic maxillary sinusitis: Secondary | ICD-10-CM | POA: Diagnosis not present

## 2017-02-11 DIAGNOSIS — J322 Chronic ethmoidal sinusitis: Secondary | ICD-10-CM | POA: Diagnosis not present

## 2017-02-11 DIAGNOSIS — R040 Epistaxis: Secondary | ICD-10-CM | POA: Diagnosis not present

## 2017-02-18 DIAGNOSIS — J322 Chronic ethmoidal sinusitis: Secondary | ICD-10-CM | POA: Diagnosis not present

## 2017-02-18 DIAGNOSIS — J320 Chronic maxillary sinusitis: Secondary | ICD-10-CM | POA: Diagnosis not present

## 2017-02-18 DIAGNOSIS — J370 Chronic laryngitis: Secondary | ICD-10-CM | POA: Diagnosis not present

## 2017-03-26 ENCOUNTER — Encounter: Payer: Self-pay | Admitting: Family Medicine

## 2017-03-26 ENCOUNTER — Ambulatory Visit: Payer: 59 | Admitting: Family Medicine

## 2017-03-26 VITALS — BP 150/100 | HR 95 | Temp 97.5°F | Resp 16 | Ht 69.0 in | Wt 208.0 lb

## 2017-03-26 DIAGNOSIS — J324 Chronic pansinusitis: Secondary | ICD-10-CM | POA: Diagnosis not present

## 2017-03-26 MED ORDER — FLUTICASONE PROPIONATE 50 MCG/ACT NA SUSP: 2.0000 | Freq: Every day | NASAL | 6 refills | Status: DC

## 2017-03-26 MED ORDER — PREDNISONE 20 MG PO TABS: ORAL_TABLET | ORAL | 0 refills | Status: DC

## 2017-03-26 NOTE — Progress Notes (Signed)
Subjective:    Patient ID: Christopher Carrillo, male    DOB: [birth year], 46 y.o.   MRN: [id number]  HPI I originally saw the patient in the beginning of October for epistaxis.  Patient subsequently saw my partner in late October for sinusitis and was treated with an antibiotic.  Symptoms did not improve, he continued to have constant rhinorrhea and head congestion.  Subsequently saw an ear nose and throat physician who saw him on 2 separate occasions and according to the patient's report has been treated with 2 separate antibiotics.  He presents today stating that he has had nonstop rhinorrhea for the last 2 months.  He has audible nasal congestion today.  He reports yellow and clear rhinorrhea.  He denies any pain in his maxillary sinuses.  He denies any pain in his frontal sinuses.  He denies any headache.  He denies any fevers or chills.  But he has constant rhinorrhea and nasal congestion.  He has to breathe through his mouth at night to be able to sleep.  He is tried Afrin off and on over the last month.  He would take it 1 or 2 days a month.  He is quit for the last 2 weeks.  Symptoms have not improved. Past Medical History:  Diagnosis Date  . Psoriasis    No past surgical history on file. Current Outpatient Medications on File Prior to Visit  Medication Sig Dispense Refill  . Ascorbic Acid (VITAMIN C) 1000 MG tablet Take 1,000 mg by mouth daily.    . mometasone (ELOCON) 0.1 % cream Apply 1 application topically daily. 45 g 0  . Multiple Vitamin (MULTIVITAMIN) tablet Take 1 tablet by mouth daily.    . sildenafil (VIAGRA) 100 MG tablet Take 1 tablet (100 mg total) by mouth daily as needed for erectile dysfunction. 10 tablet 5   No current facility-administered medications on file prior to visit.    No Known Allergies Social History   Socioeconomic History  . Marital status: Single    Spouse name: Not on file  . Number of children: Not on file  . Years of education: Not on file  .  Highest education level: Not on file  Social Needs  . Financial resource strain: Not on file  . Food insecurity - worry: Not on file  . Food insecurity - inability: Not on file  . Transportation needs - medical: Not on file  . Transportation needs - non-medical: Not on file  Occupational History  . Not on file  Tobacco Use  . Smoking status: Former Smoker    Packs/day: 0.50    Types: Cigarettes    Last attempt to quit: 03/20/2016    Years since quitting: 1.0  . Smokeless tobacco: Never Used  Substance and Sexual Activity  . Alcohol use: Yes  . Drug use: No  . Sexual activity: Not on file  Other Topics Concern  . Not on file  Social History Narrative  . Not on file      Review of Systems  All other systems reviewed and are negative.      Objective:   Physical Exam  Constitutional: He appears well-developed and well-nourished.  HENT:  Right Ear: Tympanic membrane and ear canal normal.  Left Ear: Tympanic membrane and ear canal normal.  Nose: Mucosal edema and rhinorrhea present. No nose lacerations, nasal deformity or nasal septal hematoma. No epistaxis. Right sinus exhibits no maxillary sinus tenderness and no frontal sinus tenderness. Left sinus exhibits no  maxillary sinus tenderness and no frontal sinus tenderness.  Cardiovascular: Normal rate, regular rhythm and normal heart sounds.  Pulmonary/Chest: Effort normal and breath sounds normal.  Vitals reviewed.         Assessment & Plan:  Chronic pansinusitis  I believe this is chronic sinusitis.  However he has been treated with 3 separate antibiotics.  I wonder if this could be allergic rhinosinusitis rather than due to infection.  I will try the patient on a prednisone taper pack to suppress allergies and follow that up with Flonase 2 sprays each nostril daily.  If symptoms improve, and he remains asymptomatic for 1 month, I would recommend a trial of discontinuation of Flonase.  If symptoms persist, I would  recommend a CT scan of the sinuses to evaluate for fluid collection that would be amenable to drainage by an ENT surgeon.  Also, consider allergy testing.  He states that he is cleaning his CPAP supplies regularly and therefore he does not believe that there is mold in his CPAP.  He also denies any mold in his home.  He denies any new allergic triggers such as a pet.

## 2017-04-12 DIAGNOSIS — J069 Acute upper respiratory infection, unspecified: Secondary | ICD-10-CM | POA: Diagnosis not present

## 2017-04-12 DIAGNOSIS — J029 Acute pharyngitis, unspecified: Secondary | ICD-10-CM | POA: Diagnosis not present

## 2017-06-03 ENCOUNTER — Encounter: Payer: Self-pay | Admitting: Family Medicine

## 2017-06-03 ENCOUNTER — Ambulatory Visit: Payer: 59 | Admitting: Family Medicine

## 2017-06-03 VITALS — BP 160/94 | HR 110 | Temp 98.1°F | Resp 16 | Ht 69.0 in | Wt 203.0 lb

## 2017-06-03 DIAGNOSIS — R5382 Chronic fatigue, unspecified: Secondary | ICD-10-CM

## 2017-06-03 DIAGNOSIS — R945 Abnormal results of liver function studies: Secondary | ICD-10-CM | POA: Diagnosis not present

## 2017-06-03 NOTE — Progress Notes (Signed)
Subjective:    Patient ID: Christopher Carrillo, male    DOB: [birth year], 47 y.o.   MRN: [id number]  HPI  Patient states that his wife thinks he is depressed.  When I asked why, the patient states because he has no energy.  He never wants to do anything.  He has no desire to get off the couch and even leave the house other than to go to work.  When asked the patient if he feels depressed, he adamantly says no.  He states that his biggest issue is lack of energy and fatigue.  He has poor libido.  He states that he is extremely attracted to his wife but he has no sexual desire and he also reports erectile dysfunction.  He is concerned that he may have testosterone deficiency.  He denies any suicidal ideation.  He denies any anxiety.  He denies any anhedonia.  He still gets pleasure out of life and still has optimism for the future.  He denies any excessive feelings of guilt.  He reports a normal appetite.  He denies any trouble sleeping.  He does have a history of sleep apnea but he is compliant with his CPAP.  He basically states that he just has no energy. Past Medical History:  Diagnosis Date  . Psoriasis    No past surgical history on file. Current Outpatient Medications on File Prior to Visit  Medication Sig Dispense Refill  . Ascorbic Acid (VITAMIN C) 1000 MG tablet Take 1,000 mg by mouth daily.    . mometasone (ELOCON) 0.1 % cream Apply 1 application topically daily. 45 g 0  . Multiple Vitamin (MULTIVITAMIN) tablet Take 1 tablet by mouth daily.     No current facility-administered medications on file prior to visit.    No Known Allergies Social History   Socioeconomic History  . Marital status: Single    Spouse name: Not on file  . Number of children: Not on file  . Years of education: Not on file  . Highest education level: Not on file  Social Needs  . Financial resource strain: Not on file  . Food insecurity - worry: Not on file  . Food insecurity - inability: Not on file  .  Transportation needs - medical: Not on file  . Transportation needs - non-medical: Not on file  Occupational History  . Not on file  Tobacco Use  . Smoking status: Former Smoker    Packs/day: 0.50    Types: Cigarettes    Last attempt to quit: 03/20/2016    Years since quitting: 1.2  . Smokeless tobacco: Never Used  Substance and Sexual Activity  . Alcohol use: Yes  . Drug use: No  . Sexual activity: Not on file  Other Topics Concern  . Not on file  Social History Narrative  . Not on file     Review of Systems  All other systems reviewed and are negative.      Objective:   Physical Exam  Constitutional: He is oriented to person, place, and time. He appears well-developed and well-nourished.  Neck: Neck supple. No JVD present.  Cardiovascular: Normal rate, regular rhythm and normal heart sounds. Exam reveals no gallop and no friction rub.  No murmur heard. Pulmonary/Chest: Effort normal and breath sounds normal. No respiratory distress. He has no wheezes. He has no rales.  Abdominal: Soft. Bowel sounds are normal. He exhibits no distension and no mass. There is no tenderness. There is no rebound and no guarding.  Lymphadenopathy:    He has no cervical adenopathy.  Neurological: He is alert and oriented to person, place, and time. He has normal reflexes. He displays normal reflexes. No cranial nerve deficit. He exhibits normal muscle tone. Coordination normal.  Vitals reviewed.         Assessment & Plan:  Chronic fatigue - Plan: CBC with Differential/Platelet, COMPLETE METABOLIC PANEL WITH GFR, TSH, Sedimentation rate, Vitamin B12, Iron, Testosterone Total,Free,Bio, Males  Begin workup for chronic fatigue with a CBC to evaluate for anemia another bone marrow abnormalities, CMP to evaluate for any electrolyte disturbances, a TSH to evaluate for hypothyroidism, sedimentation rate to evaluate for autoimmune diseases.  I will check a vitamin B12 and iron level to evaluate for  any vitamin deficiencies.  I will check a testosterone level to check for hypogonadism.  If lab work is normal, we may need to consider an empiric trial of an antidepressant.  His blood pressure today is extremely high however he states that he is extremely nervous discussing this with me.  I have recommended checking blood pressure more frequently at home over the next week and confirming these values or refuting them based on his home measurements

## 2017-06-04 ENCOUNTER — Other Ambulatory Visit: Payer: Self-pay | Admitting: Family Medicine

## 2017-06-04 ENCOUNTER — Encounter: Payer: Self-pay | Admitting: Family Medicine

## 2017-06-04 DIAGNOSIS — R7989 Other specified abnormal findings of blood chemistry: Secondary | ICD-10-CM

## 2017-06-04 DIAGNOSIS — R945 Abnormal results of liver function studies: Principal | ICD-10-CM

## 2017-06-05 LAB — COMPLETE METABOLIC PANEL WITH GFR
AG RATIO: 1.6 (calc) (ref 1.0–2.5)
ALKALINE PHOSPHATASE (APISO): 75 U/L (ref 40–115)
ALT: 56 U/L — ABNORMAL HIGH (ref 9–46)
AST: 49 U/L — AB (ref 10–40)
Albumin: 4.8 g/dL (ref 3.6–5.1)
BILIRUBIN TOTAL: 0.5 mg/dL (ref 0.2–1.2)
BUN: 10 mg/dL (ref 7–25)
CHLORIDE: 102 mmol/L (ref 98–110)
CO2: 28 mmol/L (ref 20–32)
Calcium: 9.8 mg/dL (ref 8.6–10.3)
Creat: 0.79 mg/dL (ref 0.60–1.35)
GFR, Est African American: 126 mL/min/{1.73_m2} (ref >=60)
GFR, Est Non African American: 108 mL/min/{1.73_m2} (ref >=60)
Globulin: 3 g/dL (calc) (ref 1.9–3.7)
Glucose, Bld: 99 mg/dL (ref 65–99)
POTASSIUM: 4.5 mmol/L (ref 3.5–5.3)
SODIUM: 143 mmol/L (ref 135–146)
Total Protein: 7.8 g/dL (ref 6.1–8.1)

## 2017-06-05 LAB — CBC WITH DIFFERENTIAL/PLATELET
BASOS ABS: 72 {cells}/uL (ref 0–200)
Basophils Relative: 1.2 %
Eosinophils Absolute: 138 cells/uL (ref 15–500)
Eosinophils Relative: 2.3 %
HCT: 50.9 % — ABNORMAL HIGH (ref 38.5–50.0)
Hemoglobin: 18.1 g/dL — ABNORMAL HIGH (ref 13.2–17.1)
Lymphs Abs: 1962 cells/uL (ref 850–3900)
MCH: 33.5 pg — AB (ref 27.0–33.0)
MCHC: 35.6 g/dL (ref 32.0–36.0)
MCV: 94.1 fL (ref 80.0–100.0)
MPV: 11.8 fL (ref 7.5–12.5)
Monocytes Relative: 12.4 %
NEUTROS PCT: 51.4 %
Neutro Abs: 3084 cells/uL (ref 1500–7800)
Platelets: 281 10*3/uL (ref 140–400)
RBC: 5.41 10*6/uL (ref 4.20–5.80)
RDW: 13.3 % (ref 11.0–15.0)
TOTAL LYMPHOCYTE: 32.7 %
WBC: 6 10*3/uL (ref 3.8–10.8)
WBCMIX: 744 {cells}/uL (ref 200–950)

## 2017-06-05 LAB — TESTOSTERONE TOTAL,FREE,BIO, MALES
Albumin: 4.8 g/dL (ref 3.6–5.1)
Sex Hormone Binding: 86 nmol/L — ABNORMAL HIGH (ref 10–50)
TESTOSTERONE BIOAVAILABLE: 82.2 ng/dL — AB (ref >=110.0)
Testosterone, Free: 37.6 pg/mL — ABNORMAL LOW (ref 46.0–224.0)
Testosterone: 654 ng/dL (ref 250–827)

## 2017-06-05 LAB — TEST AUTHORIZATION

## 2017-06-05 LAB — HEPATITIS PANEL, ACUTE
HEP A IGM: NONREACTIVE
HEP B C IGM: NONREACTIVE
HEP B S AG: NONREACTIVE
HEP C AB: NONREACTIVE
SIGNAL TO CUT-OFF: 0.01 (ref <1.00)

## 2017-06-05 LAB — SEDIMENTATION RATE: SED RATE: 2 mm/h (ref 0–15)

## 2017-06-05 LAB — TSH: TSH: 1.37 mIU/L (ref 0.40–4.50)

## 2017-06-05 LAB — VITAMIN B12: VITAMIN B 12: 290 pg/mL (ref 200–1100)

## 2017-06-05 LAB — IRON: Iron: 176 ug/dL (ref 50–180)

## 2017-06-06 ENCOUNTER — Encounter: Payer: Self-pay | Admitting: Family Medicine

## 2017-06-13 ENCOUNTER — Ambulatory Visit: Payer: 59 | Admitting: Family Medicine

## 2017-06-13 ENCOUNTER — Encounter: Payer: Self-pay | Admitting: Family Medicine

## 2017-06-13 VITALS — BP 140/84 | HR 98 | Temp 97.9°F | Resp 14 | Ht 69.0 in | Wt 206.0 lb

## 2017-06-13 DIAGNOSIS — R030 Elevated blood-pressure reading, without diagnosis of hypertension: Secondary | ICD-10-CM | POA: Diagnosis not present

## 2017-06-13 DIAGNOSIS — D751 Secondary polycythemia: Secondary | ICD-10-CM | POA: Diagnosis not present

## 2017-06-13 DIAGNOSIS — R7989 Other specified abnormal findings of blood chemistry: Secondary | ICD-10-CM | POA: Diagnosis not present

## 2017-06-13 DIAGNOSIS — G4733 Obstructive sleep apnea (adult) (pediatric): Secondary | ICD-10-CM | POA: Diagnosis not present

## 2017-06-13 DIAGNOSIS — F17200 Nicotine dependence, unspecified, uncomplicated: Secondary | ICD-10-CM | POA: Diagnosis not present

## 2017-06-13 NOTE — Progress Notes (Signed)
Subjective:    Patient ID: Christopher Carrillo, male    DOB: Apr 26, 1970, 47 y.o.   MRN: [id number]  HPI 06/03/17 Patient states that his wife thinks he is depressed.  When I asked why, the patient states because he has no energy.  He never wants to do anything.  He has no desire to get off the couch and even leave the house other than to go to work.  When asked the patient if he feels depressed, he adamantly says no.  He states that his biggest issue is lack of energy and fatigue.  He has poor libido.  He states that he is extremely attracted to his wife but he has no sexual desire and he also reports erectile dysfunction.  He is concerned that he may have testosterone deficiency.  He denies any suicidal ideation.  He denies any anxiety.  He denies any anhedonia.  He still gets pleasure out of life and still has optimism for the future.  He denies any excessive feelings of guilt.  He reports a normal appetite.  He denies any trouble sleeping.  He does have a history of sleep apnea but he is compliant with his CPAP.  He basically states that he just has no energy.  At that time, my plan was:  Begin workup for chronic fatigue with a CBC to evaluate for anemia another bone marrow abnormalities, CMP to evaluate for any electrolyte disturbances, a TSH to evaluate for hypothyroidism, sedimentation rate to evaluate for autoimmune diseases.  I will check a vitamin B12 and iron level to evaluate for any vitamin deficiencies.  I will check a testosterone level to check for hypogonadism.  If lab work is normal, we may need to consider an empiric trial of an antidepressant.  His blood pressure today is extremely high however he states that he is extremely nervous discussing this with me.  I have recommended checking blood pressure more frequently at home over the next week and confirming these values or refuting them based on his home measurements  06/13/17 Office Visit on 06/03/2017  Component Date Value Ref Range  Status  . WBC 06/03/2017 6.0  3.8 - 10.8 Thousand/uL Final  . RBC 06/03/2017 5.41  4.20 - 5.80 Million/uL Final  . Hemoglobin 06/03/2017 18.1* 13.2 - 17.1 g/dL Final  . HCT 06/03/2017 50.9* 38.5 - 50.0 % Final  . MCV 06/03/2017 94.1  80.0 - 100.0 fL Final  . MCH 06/03/2017 33.5* 27.0 - 33.0 pg Final  . MCHC 06/03/2017 35.6  32.0 - 36.0 g/dL Final  . RDW 06/03/2017 13.3  11.0 - 15.0 % Final  . Platelets 06/03/2017 281  140 - 400 Thousand/uL Final  . MPV 06/03/2017 11.8  7.5 - 12.5 fL Final  . Neutro Abs 06/03/2017 3,084  1,500 - 7,800 cells/uL Final  . Lymphs Abs 06/03/2017 1,962  850 - 3,900 cells/uL Final  . WBC mixed population 06/03/2017 744  200 - 950 cells/uL Final  . Eosinophils Absolute 06/03/2017 138  15 - 500 cells/uL Final  . Basophils Absolute 06/03/2017 72  0 - 200 cells/uL Final  . Neutrophils Relative % 06/03/2017 51.4  % Final  . Total Lymphocyte 06/03/2017 32.7  % Final  . Monocytes Relative 06/03/2017 12.4  % Final  . Eosinophils Relative 06/03/2017 2.3  % Final  . Basophils Relative 06/03/2017 1.2  % Final  . Glucose, Bld 06/03/2017 99  65 - 99 mg/dL Final   Comment: .  Fasting reference interval .   . BUN 06/03/2017 10  7 - 25 mg/dL Final  . Creat 06/03/2017 0.79  0.60 - 1.35 mg/dL Final  . GFR, Est Non African American 06/03/2017 108  > OR = 60 mL/min/1.70m Final  . GFR, Est African American 06/03/2017 126  > OR = 60 mL/min/1.761mFinal  . BUN/Creatinine Ratio 0257/01/7793OT APPLICABLE  6 - 22 (calc) Final  . Sodium 06/03/2017 143  135 - 146 mmol/L Final  . Potassium 06/03/2017 4.5  3.5 - 5.3 mmol/L Final  . Chloride 06/03/2017 102  98 - 110 mmol/L Final  . CO2 06/03/2017 28  20 - 32 mmol/L Final  . Calcium 06/03/2017 9.8  8.6 - 10.3 mg/dL Final  . Total Protein 06/03/2017 7.8  6.1 - 8.1 g/dL Final  . Albumin 06/03/2017 4.8  3.6 - 5.1 g/dL Final  . Globulin 06/03/2017 3.0  1.9 - 3.7 g/dL (calc) Final  . AG Ratio 06/03/2017 1.6  1.0 - 2.5 (calc)  Final  . Total Bilirubin 06/03/2017 0.5  0.2 - 1.2 mg/dL Final  . Alkaline phosphatase (APISO) 06/03/2017 75  40 - 115 U/L Final  . AST 06/03/2017 49* 10 - 40 U/L Final  . ALT 06/03/2017 56* 9 - 46 U/L Final  . TSH 06/03/2017 1.37  0.40 - 4.50 mIU/L Final  . Sed Rate 06/03/2017 2  0 - 15 mm/h Final  . Vitamin B-12 06/03/2017 290  200 - 1,100 pg/mL Final   Comment: . Please Note: Although the reference range for vitamin B12 is [id number] pg/mL, it has been reported that between 5 and 10% of patients with values between 200 and 400 pg/mL may experience neuropsychiatric and hematologic abnormalities due to occult B12 deficiency; less than 1% of patients with values above 400 pg/mL will have symptoms. .   . Iron 06/03/2017 176  50 - 180 mcg/dL Final  . Testosterone 06/03/2017 654  250 - 827 ng/dL Final  . Albumin 06/03/2017 4.8  3.6 - 5.1 g/dL Final  . Sex Hormone Binding 06/03/2017 86* 10 - 50 nmol/L Final  . Testosterone, Free 06/03/2017 37.6* 46.0 - 224.0 pg/mL Final  . Testosterone, Bioavailable 06/03/2017 82.2* 110.0 - 575 ng/dL Final  . Hep A IgM 06/03/2017 NON-REACTIVE  NON-REACTI Final  . Hepatitis B Surface Ag 06/03/2017 NON-REACTIVE  NON-REACTI Final  . Hep B C IgM 06/03/2017 NON-REACTIVE  NON-REACTI Final  . Hepatitis C Ab 06/03/2017 NON-REACTIVE  NON-REACTI Final  . SIGNAL TO CUT-OFF 06/03/2017 0.01  <1.00 Final   Comment: . HCV antibody was non-reactive. There is no laboratory  evidence of HCV infection. . In most cases, no further action is required. However, if recent HCV exposure is suspected, a test for HCV RNA (test code [id number] suggested. . For additional information please refer to http://education.[facility].com/faq/FAQ22v1 (This link is being provided for informational/ educational purposes only.) .   . Marland KitchenEST NAME: 06/03/2017 HEPATITIS PANEL, ACUTE W/REFL   Final  . TEST CODE: 06/03/2017 [id number]   Final  . CLIENT CONTACT: 06/03/2017 KILearta Codding  Final  . REPORT ALWAYS MESSAGE SIGNATURE 06/03/2017    Final   Comment: . The laboratory testing on this patient was verbally requested or confirmed by the ordering physician or his or her authorized representative after contact with an employee of [employer] regulations require that we maintain on file written authorization for all laboratory testing.  Accordingly we are asking that the ordering physician or his or her authorized  representative sign a copy of this report and promptly return it to the client service representative. . . Signature:____________________________________________________ . Please fax this signed page to [phone] or return it via your [employer] courier.    Patient is here today for follow-up. I reviewed the labs with the patient today in detail for more than 25 minutes. Patient admits that he has not been wearing his CPAP. He is also been smoking about one pack of cigarettes every 2 days. He also drinks half a gallon of vodka per week he questions if this may be causing his elevated blood pressure affecting his testosterone level causing his elevated liver function test and causing his elevated hgb.   Past Medical History:  Diagnosis Date  . Psoriasis    No past surgical history on file. Current Outpatient Medications on File Prior to Visit  Medication Sig Dispense Refill  . Ascorbic Acid (VITAMIN C) 1000 MG tablet Take 1,000 mg by mouth daily.    . mometasone (ELOCON) 0.1 % cream Apply 1 application topically daily. 45 g 0  . Multiple Vitamin (MULTIVITAMIN) tablet Take 1 tablet by mouth daily.     No current facility-administered medications on file prior to visit.    No Known Allergies Social History   Socioeconomic History  . Marital status: Married    Spouse name: Not on file  . Number of children: Not on file  . Years of education: Not on file  . Highest education level: Not on file  Social Needs  . Financial  resource strain: Not on file  . Food insecurity - worry: Not on file  . Food insecurity - inability: Not on file  . Transportation needs - medical: Not on file  . Transportation needs - non-medical: Not on file  Occupational History  . Not on file  Tobacco Use  . Smoking status: Former Smoker    Packs/day: 0.50    Types: Cigarettes    Last attempt to quit: 03/20/2016    Years since quitting: 1.2  . Smokeless tobacco: Never Used  Substance and Sexual Activity  . Alcohol use: Yes  . Drug use: No  . Sexual activity: Not on file  Other Topics Concern  . Not on file  Social History Narrative  . Not on file     Review of Systems  All other systems reviewed and are negative.      Objective:   Physical Exam  Constitutional: He is oriented to person, place, and time. He appears well-developed and well-nourished.  Neck: Neck supple. No JVD present.  Cardiovascular: Normal rate, regular rhythm and normal heart sounds. Exam reveals no gallop and no friction rub.  No murmur heard. Pulmonary/Chest: Effort normal and breath sounds normal. No respiratory distress. He has no wheezes. He has no rales.  Abdominal: Soft. Bowel sounds are normal. He exhibits no distension and no mass. There is no tenderness. There is no rebound and no guarding.  Lymphadenopathy:    He has no cervical adenopathy.  Neurological: He is alert and oriented to person, place, and time. He has normal reflexes. No cranial nerve deficit. He exhibits normal muscle tone. Coordination normal.  Vitals reviewed.         Assessment & Plan:  Polycythemia  Elevated blood pressure reading  Low testosterone  OSA (obstructive sleep apnea)  Smoking I believe the patient's lifestyle is causing his lab abnormalities. I believe the elevated hemoglobin is likely due to hypoxia due to untreated obstructive sleep apnea along  with the smoking. I recommended smoking cessation and compliance with CPAP and then recheck a  hemoglobin in 2 months prior to checking for Barnabas Lister 2 mutation. I believe his elevated liver function tests are likely due to his alcohol consumption. I believe alcohol consumption could be suppressing his testosterone level contributing to his fatigue as well. I also believe that all these factors could be contributing to his borderline hypertension. Patient elects to quit smoking, quit drinking, wear his CPAP, and recheck lab work in 2 months.

## 2017-06-19 ENCOUNTER — Ambulatory Visit
Admission: RE | Admit: 2017-06-19 | Discharge: 2017-06-19 | Disposition: A | Discharge status: Home / Self Care | Payer: 59 | Source: Ambulatory Visit | Attending: Family Medicine | Admitting: Family Medicine

## 2017-06-19 DIAGNOSIS — R7989 Other specified abnormal findings of blood chemistry: Secondary | ICD-10-CM

## 2017-06-19 DIAGNOSIS — R945 Abnormal results of liver function studies: Principal | ICD-10-CM

## 2017-09-26 DIAGNOSIS — M5442 Lumbago with sciatica, left side: Secondary | ICD-10-CM | POA: Diagnosis not present

## 2017-09-26 DIAGNOSIS — M9905 Segmental and somatic dysfunction of pelvic region: Secondary | ICD-10-CM | POA: Diagnosis not present

## 2017-09-26 DIAGNOSIS — M9903 Segmental and somatic dysfunction of lumbar region: Secondary | ICD-10-CM | POA: Diagnosis not present

## 2017-09-30 DIAGNOSIS — M9905 Segmental and somatic dysfunction of pelvic region: Secondary | ICD-10-CM | POA: Diagnosis not present

## 2017-09-30 DIAGNOSIS — M9903 Segmental and somatic dysfunction of lumbar region: Secondary | ICD-10-CM | POA: Diagnosis not present

## 2017-09-30 DIAGNOSIS — M5442 Lumbago with sciatica, left side: Secondary | ICD-10-CM | POA: Diagnosis not present

## 2017-10-03 DIAGNOSIS — M5442 Lumbago with sciatica, left side: Secondary | ICD-10-CM | POA: Diagnosis not present

## 2017-10-03 DIAGNOSIS — M9903 Segmental and somatic dysfunction of lumbar region: Secondary | ICD-10-CM | POA: Diagnosis not present

## 2017-10-03 DIAGNOSIS — M9905 Segmental and somatic dysfunction of pelvic region: Secondary | ICD-10-CM | POA: Diagnosis not present

## 2017-10-09 DIAGNOSIS — M9903 Segmental and somatic dysfunction of lumbar region: Secondary | ICD-10-CM | POA: Diagnosis not present

## 2017-10-09 DIAGNOSIS — M5442 Lumbago with sciatica, left side: Secondary | ICD-10-CM | POA: Diagnosis not present

## 2017-10-09 DIAGNOSIS — M9905 Segmental and somatic dysfunction of pelvic region: Secondary | ICD-10-CM | POA: Diagnosis not present

## 2017-10-30 DIAGNOSIS — M9903 Segmental and somatic dysfunction of lumbar region: Secondary | ICD-10-CM | POA: Diagnosis not present

## 2017-10-30 DIAGNOSIS — M5442 Lumbago with sciatica, left side: Secondary | ICD-10-CM | POA: Diagnosis not present

## 2017-10-30 DIAGNOSIS — M9905 Segmental and somatic dysfunction of pelvic region: Secondary | ICD-10-CM | POA: Diagnosis not present

## 2018-03-18 ENCOUNTER — Encounter: Payer: Self-pay | Admitting: Family Medicine

## 2018-03-18 ENCOUNTER — Ambulatory Visit: Payer: 59 | Admitting: Family Medicine

## 2018-03-18 VITALS — BP 140/90 | HR 90 | Temp 98.0°F | Resp 16 | Ht 69.0 in | Wt 203.0 lb

## 2018-03-18 DIAGNOSIS — R945 Abnormal results of liver function studies: Secondary | ICD-10-CM | POA: Diagnosis not present

## 2018-03-18 DIAGNOSIS — R7989 Other specified abnormal findings of blood chemistry: Secondary | ICD-10-CM

## 2018-03-18 DIAGNOSIS — F17200 Nicotine dependence, unspecified, uncomplicated: Secondary | ICD-10-CM | POA: Diagnosis not present

## 2018-03-18 DIAGNOSIS — D751 Secondary polycythemia: Secondary | ICD-10-CM | POA: Diagnosis not present

## 2018-03-18 NOTE — Progress Notes (Signed)
Subjective:    Patient ID: Christopher Carrillo, male    DOB: Apr 23, 1970, 47 y.o.   MRN: [id number]  HPI 06/03/17 Patient states that his wife thinks he is depressed.  When I asked why, the patient states because he has no energy.  He never wants to do anything.  He has no desire to get off the couch and even leave the house other than to go to work.  When asked the patient if he feels depressed, he adamantly says no.  He states that his biggest issue is lack of energy and fatigue.  He has poor libido.  He states that he is extremely attracted to his wife but he has no sexual desire and he also reports erectile dysfunction.  He is concerned that he may have testosterone deficiency.  He denies any suicidal ideation.  He denies any anxiety.  He denies any anhedonia.  He still gets pleasure out of life and still has optimism for the future.  He denies any excessive feelings of guilt.  He reports a normal appetite.  He denies any trouble sleeping.  He does have a history of sleep apnea but he is compliant with his CPAP.  He basically states that he just has no energy.  At that time, my plan was:  Begin workup for chronic fatigue with a CBC to evaluate for anemia another bone marrow abnormalities, CMP to evaluate for any electrolyte disturbances, a TSH to evaluate for hypothyroidism, sedimentation rate to evaluate for autoimmune diseases.  I will check a vitamin B12 and iron level to evaluate for any vitamin deficiencies.  I will check a testosterone level to check for hypogonadism.  If lab work is normal, we may need to consider an empiric trial of an antidepressant.  His blood pressure today is extremely high however he states that he is extremely nervous discussing this with me.  I have recommended checking blood pressure more frequently at home over the next week and confirming these values or refuting them based on his home measurements  06/13/17 No visits with results within 1 Month(s) from this visit.   Latest known visit with results is:  Office Visit on 06/03/2017  Component Date Value Ref Range Status  . WBC 06/03/2017 6.0  3.8 - 10.8 Thousand/uL Final  . RBC 06/03/2017 5.41  4.20 - 5.80 Million/uL Final  . Hemoglobin 06/03/2017 18.1* 13.2 - 17.1 g/dL Final  . HCT 06/03/2017 50.9* 38.5 - 50.0 % Final  . MCV 06/03/2017 94.1  80.0 - 100.0 fL Final  . MCH 06/03/2017 33.5* 27.0 - 33.0 pg Final  . MCHC 06/03/2017 35.6  32.0 - 36.0 g/dL Final  . RDW 06/03/2017 13.3  11.0 - 15.0 % Final  . Platelets 06/03/2017 281  140 - 400 Thousand/uL Final  . MPV 06/03/2017 11.8  7.5 - 12.5 fL Final  . Neutro Abs 06/03/2017 3,084  1,500 - 7,800 cells/uL Final  . Lymphs Abs 06/03/2017 1,962  850 - 3,900 cells/uL Final  . WBC mixed population 06/03/2017 744  200 - 950 cells/uL Final  . Eosinophils Absolute 06/03/2017 138  15 - 500 cells/uL Final  . Basophils Absolute 06/03/2017 72  0 - 200 cells/uL Final  . Neutrophils Relative % 06/03/2017 51.4  % Final  . Total Lymphocyte 06/03/2017 32.7  % Final  . Monocytes Relative 06/03/2017 12.4  % Final  . Eosinophils Relative 06/03/2017 2.3  % Final  . Basophils Relative 06/03/2017 1.2  % Final  . Glucose, Bld  06/03/2017 99  65 - 99 mg/dL Final   Comment: .            Fasting reference interval .   . BUN 06/03/2017 10  7 - 25 mg/dL Final  . Creat 06/03/2017 0.79  0.60 - 1.35 mg/dL Final  . GFR, Est Non African American 06/03/2017 108  > OR = 60 mL/min/1.85m Final  . GFR, Est African American 06/03/2017 126  > OR = 60 mL/min/1.733mFinal  . BUN/Creatinine Ratio 0259/16/3846OT APPLICABLE  6 - 22 (calc) Final  . Sodium 06/03/2017 143  135 - 146 mmol/L Final  . Potassium 06/03/2017 4.5  3.5 - 5.3 mmol/L Final  . Chloride 06/03/2017 102  98 - 110 mmol/L Final  . CO2 06/03/2017 28  20 - 32 mmol/L Final  . Calcium 06/03/2017 9.8  8.6 - 10.3 mg/dL Final  . Total Protein 06/03/2017 7.8  6.1 - 8.1 g/dL Final  . Albumin 06/03/2017 4.8  3.6 - 5.1 g/dL Final  .  Globulin 06/03/2017 3.0  1.9 - 3.7 g/dL (calc) Final  . AG Ratio 06/03/2017 1.6  1.0 - 2.5 (calc) Final  . Total Bilirubin 06/03/2017 0.5  0.2 - 1.2 mg/dL Final  . Alkaline phosphatase (APISO) 06/03/2017 75  40 - 115 U/L Final  . AST 06/03/2017 49* 10 - 40 U/L Final  . ALT 06/03/2017 56* 9 - 46 U/L Final  . TSH 06/03/2017 1.37  0.40 - 4.50 mIU/L Final  . Sed Rate 06/03/2017 2  0 - 15 mm/h Final  . Vitamin B-12 06/03/2017 290  200 - 1,100 pg/mL Final   Comment: . Please Note: Although the reference range for vitamin B12 is [phone] pg/mL, it has been reported that between 5 and 10% of patients with values between 200 and 400 pg/mL may experience neuropsychiatric and hematologic abnormalities due to occult B12 deficiency; less than 1% of patients with values above 400 pg/mL will have symptoms. .   . Iron 06/03/2017 176  50 - 180 mcg/dL Final  . Testosterone 06/03/2017 654  250 - 827 ng/dL Final  . Albumin 06/03/2017 4.8  3.6 - 5.1 g/dL Final  . Sex Hormone Binding 06/03/2017 86* 10 - 50 nmol/L Final  . Testosterone, Free 06/03/2017 37.6* 46.0 - 224.0 pg/mL Final  . Testosterone, Bioavailable 06/03/2017 82.2* 110.0 - 575 ng/dL Final  . Hep A IgM 06/03/2017 NON-REACTIVE  NON-REACTI Final  . Hepatitis B Surface Ag 06/03/2017 NON-REACTIVE  NON-REACTI Final  . Hep B C IgM 06/03/2017 NON-REACTIVE  NON-REACTI Final  . Hepatitis C Ab 06/03/2017 NON-REACTIVE  NON-REACTI Final  . SIGNAL TO CUT-OFF 06/03/2017 0.01  <1.00 Final   Comment: . HCV antibody was non-reactive. There is no laboratory  evidence of HCV infection. . In most cases, no further action is required. However, if recent HCV exposure is suspected, a test for HCV RNA (test code [phone] suggested. . For additional information please refer to http://education.[facility].com/faq/FAQ22v1 (This link is being provided for informational/ educational purposes only.) .   . Marland KitchenEST NAME: 06/03/2017 HEPATITIS PANEL, ACUTE W/REFL    Final  . TEST CODE: 06/03/2017 [id number]   Final  . CLIENT CONTACT: 06/03/2017 KILearta Codding Final  . REPORT ALWAYS MESSAGE SIGNATURE 06/03/2017    Final   Comment: . The laboratory testing on this patient was verbally requested or confirmed by the ordering physician or his or her authorized representative after contact with an employee of [employer] regulations require that we  maintain on file written authorization for all laboratory testing.  Accordingly we are asking that the ordering physician or his or her authorized representative sign a copy of this report and promptly return it to the client service representative. . . Signature:____________________________________________________ . Please fax this signed page to [phone] or return it via your [employer] courier.    Patient is here today for follow-up. I reviewed the labs with the patient today in detail for more than 25 minutes. Patient admits that he has not been wearing his CPAP. He is also been smoking about one pack of cigarettes every 2 days. He also drinks half a gallon of vodka per week he questions if this may be causing his elevated blood pressure affecting his testosterone level causing his elevated liver function test and causing his elevated hgb.  At that time, my plan was: I believe the patient's lifestyle is causing his lab abnormalities. I believe the elevated hemoglobin is likely due to hypoxia due to untreated obstructive sleep apnea along with the smoking. I recommended smoking cessation and compliance with CPAP and then recheck a hemoglobin in 2 months prior to checking for Barnabas Lister 2 mutation. I believe his elevated liver function tests are likely due to his alcohol consumption. I believe alcohol consumption could be suppressing his testosterone level contributing to his fatigue as well. I also believe that all these factors could be contributing to his borderline hypertension. Patient elects  to quit smoking, quit drinking, wear his CPAP, and recheck lab work in 2 months.  03/18/18 Patient is here today to follow-up with lab abnormalities.  Since I last saw the patient, he is wearing his CPAP machine.  He is also greatly diminished the amount he is smoking.  He states that he is now smoking approximately 1 cigarette a day and less than 10 cigarettes a month.  He also has drastically reduced the amount of alcohol that he is drinking.  He states that in the past he was eating healthy however he was drinking excessively.  Therefore he is here today to follow-up on his polycythemia hoping that his hemoglobin has improved and also to recheck his liver function test. Past Medical History:  Diagnosis Date  . Psoriasis    No past surgical history on file. No current outpatient medications on file prior to visit.   No current facility-administered medications on file prior to visit.    No Known Allergies Social History   Socioeconomic History  . Marital status: Married    Spouse name: Not on file  . Number of children: Not on file  . Years of education: Not on file  . Highest education level: Not on file  Occupational History  . Not on file  Social Needs  . Financial resource strain: Not on file  . Food insecurity:    Worry: Not on file    Inability: Not on file  . Transportation needs:    Medical: Not on file    Non-medical: Not on file  Tobacco Use  . Smoking status: Former Smoker    Packs/day: 0.50    Types: Cigarettes    Last attempt to quit: 03/20/2016    Years since quitting: 1.9  . Smokeless tobacco: Never Used  Substance and Sexual Activity  . Alcohol use: Yes  . Drug use: No  . Sexual activity: Not on file  Lifestyle  . Physical activity:    Days per week: Not on file    Minutes per session: Not on  file  . Stress: Not on file  Relationships  . Social connections:    Talks on phone: Not on file    Gets together: Not on file    Attends religious service:  Not on file    Active member of club or organization: Not on file    Attends meetings of clubs or organizations: Not on file    Relationship status: Not on file  . Intimate partner violence:    Fear of current or ex partner: Not on file    Emotionally abused: Not on file    Physically abused: Not on file    Forced sexual activity: Not on file  Other Topics Concern  . Not on file  Social History Narrative  . Not on file     Review of Systems  All other systems reviewed and are negative.      Objective:   Physical Exam  Constitutional: He is oriented to person, place, and time. He appears well-developed and well-nourished.  Neck: Neck supple. No JVD present.  Cardiovascular: Normal rate, regular rhythm and normal heart sounds. Exam reveals no gallop and no friction rub.  No murmur heard. Pulmonary/Chest: Effort normal and breath sounds normal. No respiratory distress. He has no wheezes. He has no rales.  Abdominal: Soft. Bowel sounds are normal. He exhibits no distension and no mass. There is no tenderness. There is no rebound and no guarding.  Lymphadenopathy:    He has no cervical adenopathy.  Neurological: He is alert and oriented to person, place, and time. He has normal reflexes. No cranial nerve deficit. He exhibits normal muscle tone. Coordination normal.  Vitals reviewed.         Assessment & Plan:  Polycythemia - Plan: CBC with Differential/Platelet, COMPLETE METABOLIC PANEL WITH GFR  Elevated LFTs  Smoking Patient is trying to make therapeutic lifestyle changes.  I have recommended complete and total abstinence from alcohol and smoking cessation.  I will recheck a CBC and a CMP today as we discussed.  His blood pressure today is borderline.  Await the results of his lab test.

## 2018-03-19 LAB — CBC WITH DIFFERENTIAL/PLATELET
BASOS PCT: 1 %
Basophils Absolute: 78 cells/uL (ref 0–200)
Eosinophils Absolute: 117 cells/uL (ref 15–500)
Eosinophils Relative: 1.5 %
HEMATOCRIT: 46.5 % (ref 38.5–50.0)
Hemoglobin: 16.4 g/dL (ref 13.2–17.1)
LYMPHS ABS: 1732 {cells}/uL (ref 850–3900)
MCH: 33.4 pg — ABNORMAL HIGH (ref 27.0–33.0)
MCHC: 35.3 g/dL (ref 32.0–36.0)
MCV: 94.7 fL (ref 80.0–100.0)
MPV: 10.8 fL (ref 7.5–12.5)
Monocytes Relative: 12.4 %
NEUTROS PCT: 62.9 %
Neutro Abs: 4906 cells/uL (ref 1500–7800)
Platelets: 284 10*3/uL (ref 140–400)
RBC: 4.91 10*6/uL (ref 4.20–5.80)
RDW: 13.6 % (ref 11.0–15.0)
Total Lymphocyte: 22.2 %
WBC: 7.8 10*3/uL (ref 3.8–10.8)
WBCMIX: 967 {cells}/uL — AB (ref 200–950)

## 2018-03-19 LAB — COMPLETE METABOLIC PANEL WITH GFR
AG Ratio: 1.6 (calc) (ref 1.0–2.5)
ALBUMIN MSPROF: 4.6 g/dL (ref 3.6–5.1)
ALT: 36 U/L (ref 9–46)
AST: 37 U/L (ref 10–40)
Alkaline phosphatase (APISO): 67 U/L (ref 40–115)
BUN: 8 mg/dL (ref 7–25)
CALCIUM: 9.4 mg/dL (ref 8.6–10.3)
CO2: 27 mmol/L (ref 20–32)
Chloride: 99 mmol/L (ref 98–110)
Creat: 0.76 mg/dL (ref 0.60–1.35)
GFR, Est African American: 127 mL/min/{1.73_m2} (ref >=60)
GFR, Est Non African American: 109 mL/min/{1.73_m2} (ref >=60)
GLUCOSE: 86 mg/dL (ref 65–99)
Globulin: 2.8 g/dL (calc) (ref 1.9–3.7)
Potassium: 4 mmol/L (ref 3.5–5.3)
Sodium: 137 mmol/L (ref 135–146)
TOTAL PROTEIN: 7.4 g/dL (ref 6.1–8.1)
Total Bilirubin: 0.8 mg/dL (ref 0.2–1.2)

## 2018-05-15 ENCOUNTER — Other Ambulatory Visit: Payer: Self-pay | Admitting: Family Medicine

## 2018-07-31 ENCOUNTER — Ambulatory Visit (INDEPENDENT_AMBULATORY_CARE_PROVIDER_SITE_OTHER): Payer: 59 | Admitting: Family Medicine

## 2018-07-31 ENCOUNTER — Encounter: Payer: Self-pay | Admitting: Family Medicine

## 2018-07-31 ENCOUNTER — Telehealth: Payer: 59 | Admitting: Nurse Practitioner

## 2018-07-31 ENCOUNTER — Other Ambulatory Visit: Payer: Self-pay

## 2018-07-31 DIAGNOSIS — R251 Tremor, unspecified: Secondary | ICD-10-CM

## 2018-07-31 DIAGNOSIS — J309 Allergic rhinitis, unspecified: Secondary | ICD-10-CM

## 2018-07-31 DIAGNOSIS — E162 Hypoglycemia, unspecified: Secondary | ICD-10-CM

## 2018-07-31 MED ORDER — FLUTICASONE PROPIONATE 50 MCG/ACT NA SUSP: 2.0000 | Freq: Every day | NASAL | 0 refills | Status: DC

## 2018-07-31 NOTE — Progress Notes (Signed)
E visit for Allergic Rhinitis We are sorry that you are not feeling well.  Here is how we plan to help!  Based on what you have shared with me it looks like you have Allergic Rhinitis.  Rhinitis is when a reaction occurs that causes nasal congestion, runny nose, sneezing, and itching.  Most types of rhinitis are caused by an inflammation and are associated with symptoms in the eyes ears or throat. There are several types of rhinitis.  The most common are acute rhinitis, which is usually caused by a viral illness, allergic or seasonal rhinitis, and nonallergic or year-round rhinitis.  Nasal allergies occur certain times of the year.  Allergic rhinitis is caused when allergens in the air trigger the release of histamine in the body.  Histamine causes itching, swelling, and fluid to build up in the fragile linings of the nasal passages, sinuses and eyelids.  An itchy nose and clear discharge are common.  I recommend the following over the counter treatments: Xyzal 5 mg take 1 tablet daily  I also would recommend a nasal spray: Flonase 2 sprays into each nostril once daily   HOME CARE:   You can use an over-the-counter saline nasal spray as needed  Avoid areas where there is heavy dust, mites, or molds  Stay indoors on windy days during the pollen season  Keep windows closed in home, at least in bedroom; use air conditioner.  Use high-efficiency house air filter  Keep windows closed in car, turn AC on re-circulate  Avoid playing out with dog during pollen season  GET HELP RIGHT AWAY IF:   If your symptoms do not improve within 10 days  You become short of breath  You develop yellow or green discharge from your nose for over 3 days  You have coughing fits  MAKE SURE YOU:   Understand these instructions  Will watch your condition  Will get help right away if you are not doing well or get worse  Thank you for choosing an e-visit. Your e-visit answers were reviewed by a  board certified advanced clinical practitioner to complete your personal care plan. Depending upon the condition, your plan could have included both over the counter or prescription medications. Please review your pharmacy choice. Be sure that the pharmacy you have chosen is open so that you can pick up your prescription now.  If there is a problem you may message your provider in [place] to have the prescription routed to another pharmacy. Your safety is important to [place]. If you have drug allergies check your prescription carefully.  For the next 24 hours, you can use MyChart to ask questions about today's visit, request a non-urgent call back, or ask for a work or school excuse from your e-visit provider. You will get an email in the next two days asking about your experience. I hope that your e-visit has been valuable and will speed your recovery.

## 2018-07-31 NOTE — Progress Notes (Signed)
Subjective:     Patient ID: Christopher Carrillo, male   DOB: [birth year], 48 y.o.   MRN: [id number]  HPI  Patient is being seen today as a telephone visit.  Phone call began at 1210.  Phone call ended at 1225.  Patient is currently at home.  I am currently my office.  He consents to be seen today over the telephone.  Patient states for the last 2 weeks or so, he is felt very "shaky "in the afternoons.  It usually starts around 2 PM in the afternoon every day.  Both hands will literally shake.  By the time he gets home at 5:00, it is difficult for him to hold a glass of water.  He will feel extremely weak and jittery.  Patient does not eat breakfast.  He does not eat lunch.  He will eat a large dinner.  Shortly after he eats dinner, the symptoms will improve and the symptoms will not return again until the following afternoon around 2 PM.  He bought a blood sugar meter thinking that he may be a diabetic.  He is checked his blood sugars.  Typically they are 88-90 when he is symptomatic.  The highest he is seen it is 151.  He also feels very is he.  He will feel very hot and flushed.  His heart will race.  Symptoms are consistent with hypoglycemia versus thyroid storm.  Over the last 2 days, he is tried eating a tomato sandwich at work however he has to force himself to swallow.  He states he has very little appetite.  Symptoms have not improved.  He denies feeling anxious or panicked.  He denies any chest pain shortness of breath or dyspnea on exertion.  He told his office at work about feeling hot and they took his temperature and it was 99 yesterday.  They wanted him seen by Dr. and have a note before he could return to work.  He denies any fever.  He is 97.9 today at home.  He denies any cough or shortness of breath or symptoms of contagious illness. Past Medical History:  Diagnosis Date  . Psoriasis    No past surgical history on file. Current Outpatient Medications on File Prior to Visit  Medication Sig  Dispense Refill  . fluticasone (FLONASE) 50 MCG/ACT nasal spray Place 2 sprays into both nostrils daily for 10 days. 16 g 0  . mometasone (ELOCON) 0.1 % cream APPLY TO AFFECTED AREA EVERY DAY 45 g 0   No current facility-administered medications on file prior to visit.    No Known Allergies Social History   Socioeconomic History  . Marital status: Married    Spouse name: Not on file  . Number of children: Not on file  . Years of education: Not on file  . Highest education level: Not on file  Occupational History  . Not on file  Social Needs  . Financial resource strain: Not on file  . Food insecurity:    Worry: Not on file    Inability: Not on file  . Transportation needs:    Medical: Not on file    Non-medical: Not on file  Tobacco Use  . Smoking status: Former Smoker    Packs/day: 0.50    Types: Cigarettes    Last attempt to quit: 03/20/2016    Years since quitting: 2.3  . Smokeless tobacco: Never Used  Substance and Sexual Activity  . Alcohol use: Yes  . Drug use: No  .  Sexual activity: Not on file  Lifestyle  . Physical activity:    Days per week: Not on file    Minutes per session: Not on file  . Stress: Not on file  Relationships  . Social connections:    Talks on phone: Not on file    Gets together: Not on file    Attends religious service: Not on file    Active member of club or organization: Not on file    Attends meetings of clubs or organizations: Not on file    Relationship status: Not on file  . Intimate partner violence:    Fear of current or ex partner: Not on file    Emotionally abused: Not on file    Physically abused: Not on file    Forced sexual activity: Not on file  Other Topics Concern  . Not on file  Social History Narrative  . Not on file    Review of Systems  All other systems reviewed and are negative.      Objective:   Physical Exam    No physical exam was performed today as the patient was seen as a telephone visit.   However the patient is breathing comfortably while talking on the telephone in full and complete sentences.  He denies any fever he is 97.9.  His heart rate at the present moment is in the 80s.  He is unable to check his blood pressure. Assessment:     Tremor - Plan: CBC with Differential/Platelet, COMPLETE METABOLIC PANEL WITH GFR, TSH, Hemoglobin A1c  Hypoglycemia - Plan: CBC with Differential/Platelet, COMPLETE METABOLIC PANEL WITH GFR, TSH, Hemoglobin A1c   Plan:     I am uncertain exactly what is causing his symptoms.  First, I do believe it safe for the patient to return to work.  His symptoms do not sound like a contagious illness such as COVID-19.  Differential diagnosis includes hypoglycemic episodes with resulting glucagon release causing tachycardia, dizziness, feeling hot and flushed, and shaking tremor.  I believe this is the most likely explanation based on his meal pattern and the pattern of the recurrent tremors.  Also hyperthyroidism is on the differential diagnosis as well as panic attacks.  Patient denies panic attacks.  I recommended that he come by for blood work including CBC, CMP, TSH, and hemoglobin A1c.  I also recommended that he eat a well-balanced meal 3 times a day including a meal rich in protein to avoid postprandial hypoglycemia and see if his symptoms improve.  If the lab work is normal and his symptoms do not improve after eating a well-balanced meal 3 times a day for the next few days, I would recommend evaluating the patient again in clinic face-to-face that we can perform a thorough exam and considering possible anxiety given the diurnal pattern which seems to consistent to be a chance

## 2018-08-05 ENCOUNTER — Other Ambulatory Visit: Payer: Self-pay

## 2018-08-05 ENCOUNTER — Other Ambulatory Visit: Payer: 59

## 2018-08-05 DIAGNOSIS — R945 Abnormal results of liver function studies: Secondary | ICD-10-CM

## 2018-08-05 DIAGNOSIS — R7989 Other specified abnormal findings of blood chemistry: Secondary | ICD-10-CM

## 2018-08-05 DIAGNOSIS — D751 Secondary polycythemia: Secondary | ICD-10-CM | POA: Diagnosis not present

## 2018-08-05 DIAGNOSIS — E162 Hypoglycemia, unspecified: Secondary | ICD-10-CM

## 2018-08-05 DIAGNOSIS — R251 Tremor, unspecified: Secondary | ICD-10-CM | POA: Diagnosis not present

## 2018-08-05 DIAGNOSIS — R030 Elevated blood-pressure reading, without diagnosis of hypertension: Secondary | ICD-10-CM | POA: Diagnosis not present

## 2018-08-06 LAB — COMPREHENSIVE METABOLIC PANEL
AG Ratio: 1.9 (calc) (ref 1.0–2.5)
ALT: 57 U/L — ABNORMAL HIGH (ref 9–46)
AST: 58 U/L — ABNORMAL HIGH (ref 10–40)
Albumin: 4.6 g/dL (ref 3.6–5.1)
Alkaline phosphatase (APISO): 73 U/L (ref 36–130)
BUN: 9 mg/dL (ref 7–25)
CO2: 28 mmol/L (ref 20–32)
Calcium: 9.6 mg/dL (ref 8.6–10.3)
Chloride: 104 mmol/L (ref 98–110)
Creat: 0.79 mg/dL (ref 0.60–1.35)
Globulin: 2.4 g/dL (calc) (ref 1.9–3.7)
Glucose, Bld: 101 mg/dL — ABNORMAL HIGH (ref 65–99)
Potassium: 4.5 mmol/L (ref 3.5–5.3)
Sodium: 144 mmol/L (ref 135–146)
Total Bilirubin: 0.5 mg/dL (ref 0.2–1.2)
Total Protein: 7 g/dL (ref 6.1–8.1)

## 2018-08-06 LAB — CBC WITH DIFFERENTIAL/PLATELET
Absolute Monocytes: 688 cells/uL (ref 200–950)
Basophils Absolute: 52 cells/uL (ref 0–200)
Basophils Relative: 1.2 %
Eosinophils Absolute: 120 cells/uL (ref 15–500)
Eosinophils Relative: 2.8 %
HCT: 46.7 % (ref 38.5–50.0)
Hemoglobin: 16.6 g/dL (ref 13.2–17.1)
Lymphs Abs: 1587 cells/uL (ref 850–3900)
MCH: 35.1 pg — ABNORMAL HIGH (ref 27.0–33.0)
MCHC: 35.5 g/dL (ref 32.0–36.0)
MCV: 98.7 fL (ref 80.0–100.0)
MPV: 10.3 fL (ref 7.5–12.5)
Monocytes Relative: 16 %
Neutro Abs: 1853 cells/uL (ref 1500–7800)
Neutrophils Relative %: 43.1 %
Platelets: 190 10*3/uL (ref 140–400)
RBC: 4.73 10*6/uL (ref 4.20–5.80)
RDW: 13.8 % (ref 11.0–15.0)
Total Lymphocyte: 36.9 %
WBC: 4.3 10*3/uL (ref 3.8–10.8)

## 2018-08-06 LAB — TSH: TSH: 1.45 mIU/L (ref 0.40–4.50)

## 2018-08-06 LAB — HEMOGLOBIN A1C
Hgb A1c MFr Bld: 5.6 % of total Hgb (ref <5.7)
Mean Plasma Glucose: 114 (calc)
eAG (mmol/L): 6.3 (calc)

## 2019-01-07 ENCOUNTER — Other Ambulatory Visit: Payer: Self-pay

## 2019-01-07 ENCOUNTER — Ambulatory Visit (INDEPENDENT_AMBULATORY_CARE_PROVIDER_SITE_OTHER): Payer: 59 | Admitting: Family Medicine

## 2019-01-07 ENCOUNTER — Encounter: Payer: Self-pay | Admitting: Family Medicine

## 2019-01-07 VITALS — BP 134/80 | HR 102 | Temp 98.5°F | Resp 14 | Ht 69.0 in | Wt 191.0 lb

## 2019-01-07 DIAGNOSIS — R634 Abnormal weight loss: Secondary | ICD-10-CM

## 2019-01-07 DIAGNOSIS — Z125 Encounter for screening for malignant neoplasm of prostate: Secondary | ICD-10-CM

## 2019-01-07 DIAGNOSIS — F419 Anxiety disorder, unspecified: Secondary | ICD-10-CM

## 2019-01-07 DIAGNOSIS — R69 Illness, unspecified: Secondary | ICD-10-CM | POA: Diagnosis not present

## 2019-01-07 DIAGNOSIS — R251 Tremor, unspecified: Secondary | ICD-10-CM

## 2019-01-07 DIAGNOSIS — R140 Abdominal distension (gaseous): Secondary | ICD-10-CM | POA: Diagnosis not present

## 2019-01-07 MED ORDER — MOMETASONE FUROATE 0.1 % EX CREA
TOPICAL_CREAM | CUTANEOUS | 2 refills | Status: DC
Start: 2019-01-07 — End: 2019-02-18

## 2019-01-07 MED ORDER — LORAZEPAM 0.5 MG PO TABS: 0.5000 mg | ORAL_TABLET | Freq: Two times a day (BID) | ORAL | 1 refills | Status: DC | PRN

## 2019-01-07 NOTE — Progress Notes (Signed)
Subjective:    Patient ID: Christopher Carrillo, male    DOB: 04-Oct-1970, 48 y.o.   MRN: [id number]  Patient presents for Anxiety Attacks (x2 months- episodes of bloating, insomnia, hands shaking- states that wife gave wellbutrin on Saturday and Sx worsened)  Patient here for ongoing episodes which he is not sure if it is anxiety related or something else.  He was seen back in April after he began having episodes where he would break out in a sweat on his face mostly in the nasal bridge area again he would begin having a tremor in his hands and felt abdominal bloating and then general fatigue.  Those nights he also would not be able to sleep.  He denies any chest pain or palpitations.  Initially thought it may have been his blood sugar dropping is something else metabolic going on his labs were done and were unremarkable at that time.  He has random episodes does not feel like he is overly stressed he is actually cut back some of his hours from work as that was becoming a little problematic but most episodes he does not even feel anxious or anything before it occurs.  His last 1 was a week ago when he was actually on vacation at a brewery and he started shaking so bad he could not even hold his drink.  He does admit on the night that he does have the episodes he does not sleep well is often fatigued into the next day.  He has had at times where he gets abdominal discomfort mostly epigastric region he starts to dry heaves.  He then explained what episode or afterwards he turn on the TV was watching a sad new story and just began crying out of nowhere.  He also states that anytime he has an episode his anxiety start to skyrocket if he is concerned there is something wrong with him.  Also in the midst of all this since last December he has lost 12 pounds unintentionally.  I do not see a weight from April still unsure how much has changed in the past few months.  He states he is actually been eating better and  eating at least 2 meals a day before he would only eat 1 meal a day and he does have a fairly good appetite.  Family history is notable for sister who had throat cancer at age 52  Smokes 6-8 cig/day   Review Of Systems: per above   GEN- +fatigue, fever, weight loss,weakness, recent illness HEENT- denies eye drainage, change in vision, nasal discharge, CVS- denies chest pain, palpitations RESP- denies SOB, cough, wheeze ABD- denies N/V, change in stools, abd pain GU- denies dysuria, hematuria, dribbling, incontinence MSK- denies joint pain, muscle aches, injury Neuro- denies headache, dizziness, syncope, seizure activity       Objective:    BP 134/80   Pulse (!) 102   Temp 98.5 F (36.9 C) (Oral)   Resp 14   Ht 5' 9"  (1.753 m)   Wt 191 lb (86.6 kg)   SpO2 98%   BMI 28.21 kg/m  GEN- NAD, alert and oriented x3 HEENT- PERRL, EOMI, non injected sclera, pink conjunctiva, MMM, oropharynx clear Neck- Supple, no thyromegaly CVS- RRR, no murmur RESP-CTAB ABD-NABS,soft,NT,ND Psych- normal affect and mood  Neuro-CNII-XII in tact no focal deficits  EXT- No edema Pulses- Radial, DP- 2+   GAD 7 score 14  / PHQ 9 score 8      Assessment & Plan:  Problem List Items Addressed This Visit    None    Visit Diagnoses    Abdominal bloating    -  Primary   Relevant Orders   CBC with Differential/Platelet   Comprehensive metabolic panel   Lipase   H. pylori breath test   Tremor       Unintentional weight loss       Relevant Orders   PSA   Prostate cancer screening       Relevant Orders   PSA   Anxiety       His symptoms are difficult to put together, does not have the classic symptoms with anxiety/panic disorder one would expect, but yet thus far nothing particularly metabolic has been found he did have mildly elevated liver function tests but he is also cut back on some his alcohol. Regarding his weight loss that is unexplained as he has not been trying to lose weight  and unless he is having an episode he feels well during his normal activities his appetite is good. So to help tease things out and going to work-up the abdominal episodes that he is having along with the weight loss.  I did an H. pylori breath test along with repeating his metabolic panel which will also allow me to look at his liver function test.  We will also check a lipase level.  For his unintentional weight loss I will add in a PSA.  He is a smoker so if that comes back negative I will get a chest x-ray and he agreed to this.  For the anxiety and then give him Lorazepam 0.5 mg he can take this up to twice a day we will see how much he actually requires medication.  We did discuss going on a long-term SSRI but I wanted to see how often his episodes are really occurring and how much he actually requires the medication.  We will follow-up in 1 month's time to reassess.   Relevant Medications   LORazepam (ATIVAN) 0.5 MG tablet      Note: This dictation was prepared with Dragon dictation along with smaller phrase technology. Any transcriptional errors that result from this process are unintentional.

## 2019-01-07 NOTE — Patient Instructions (Addendum)
Try the ativan as needed We will call with lab results  F/U  1 month

## 2019-01-08 LAB — LIPASE: Lipase: 58 U/L (ref 7–60)

## 2019-01-08 LAB — CBC WITH DIFFERENTIAL/PLATELET
Absolute Monocytes: 754 cells/uL (ref 200–950)
Basophils Absolute: 88 cells/uL (ref 0–200)
Basophils Relative: 1.7 %
Eosinophils Absolute: 166 cells/uL (ref 15–500)
Eosinophils Relative: 3.2 %
HCT: 51.8 % — ABNORMAL HIGH (ref 38.5–50.0)
Hemoglobin: 18 g/dL — ABNORMAL HIGH (ref 13.2–17.1)
Lymphs Abs: 1638 cells/uL (ref 850–3900)
MCH: 34.8 pg — ABNORMAL HIGH (ref 27.0–33.0)
MCHC: 34.7 g/dL (ref 32.0–36.0)
MCV: 100.2 fL — ABNORMAL HIGH (ref 80.0–100.0)
MPV: 9.9 fL (ref 7.5–12.5)
Monocytes Relative: 14.5 %
Neutro Abs: 2553 cells/uL (ref 1500–7800)
Neutrophils Relative %: 49.1 %
Platelets: 220 10*3/uL (ref 140–400)
RBC: 5.17 10*6/uL (ref 4.20–5.80)
RDW: 13.7 % (ref 11.0–15.0)
Total Lymphocyte: 31.5 %
WBC: 5.2 10*3/uL (ref 3.8–10.8)

## 2019-01-08 LAB — COMPREHENSIVE METABOLIC PANEL
AG Ratio: 1.7 (calc) (ref 1.0–2.5)
ALT: 123 U/L — ABNORMAL HIGH (ref 9–46)
AST: 113 U/L — ABNORMAL HIGH (ref 10–40)
Albumin: 4.7 g/dL (ref 3.6–5.1)
Alkaline phosphatase (APISO): 87 U/L (ref 36–130)
BUN: 13 mg/dL (ref 7–25)
CO2: 24 mmol/L (ref 20–32)
Calcium: 8.9 mg/dL (ref 8.6–10.3)
Chloride: 103 mmol/L (ref 98–110)
Creat: 0.71 mg/dL (ref 0.60–1.35)
Globulin: 2.8 g/dL (calc) (ref 1.9–3.7)
Glucose, Bld: 81 mg/dL (ref 65–99)
Potassium: 4.3 mmol/L (ref 3.5–5.3)
Sodium: 142 mmol/L (ref 135–146)
Total Bilirubin: 0.6 mg/dL (ref 0.2–1.2)
Total Protein: 7.5 g/dL (ref 6.1–8.1)

## 2019-01-08 LAB — PSA: PSA: 0.9 ng/mL (ref <=4.0)

## 2019-01-08 LAB — H. PYLORI BREATH TEST: H. pylori Breath Test: NOT DETECTED

## 2019-01-09 ENCOUNTER — Other Ambulatory Visit: Payer: Self-pay | Admitting: *Deleted

## 2019-01-09 DIAGNOSIS — R7989 Other specified abnormal findings of blood chemistry: Secondary | ICD-10-CM

## 2019-01-09 DIAGNOSIS — R945 Abnormal results of liver function studies: Secondary | ICD-10-CM

## 2019-01-09 DIAGNOSIS — R634 Abnormal weight loss: Secondary | ICD-10-CM

## 2019-01-09 DIAGNOSIS — R109 Unspecified abdominal pain: Secondary | ICD-10-CM

## 2019-01-21 ENCOUNTER — Other Ambulatory Visit: Payer: Self-pay | Admitting: Family Medicine

## 2019-01-21 DIAGNOSIS — R7989 Other specified abnormal findings of blood chemistry: Secondary | ICD-10-CM

## 2019-01-21 DIAGNOSIS — R634 Abnormal weight loss: Secondary | ICD-10-CM

## 2019-01-21 DIAGNOSIS — R109 Unspecified abdominal pain: Secondary | ICD-10-CM

## 2019-01-21 DIAGNOSIS — R945 Abnormal results of liver function studies: Secondary | ICD-10-CM

## 2019-01-26 ENCOUNTER — Other Ambulatory Visit: Payer: 59

## 2019-01-26 ENCOUNTER — Other Ambulatory Visit: Payer: Self-pay

## 2019-01-26 DIAGNOSIS — R109 Unspecified abdominal pain: Secondary | ICD-10-CM | POA: Diagnosis not present

## 2019-01-26 DIAGNOSIS — R945 Abnormal results of liver function studies: Secondary | ICD-10-CM | POA: Diagnosis not present

## 2019-01-26 DIAGNOSIS — R7989 Other specified abnormal findings of blood chemistry: Secondary | ICD-10-CM

## 2019-01-26 DIAGNOSIS — R634 Abnormal weight loss: Secondary | ICD-10-CM

## 2019-01-27 LAB — HEPATITIS PANEL, ACUTE
Hep A IgM: NONREACTIVE
Hep B C IgM: NONREACTIVE
Hepatitis B Surface Ag: NONREACTIVE
Hepatitis C Ab: NONREACTIVE
SIGNAL TO CUT-OFF: 0.01 (ref <1.00)

## 2019-01-27 LAB — COMPLETE METABOLIC PANEL WITH GFR
AG Ratio: 1.7 (calc) (ref 1.0–2.5)
ALT: 55 U/L — ABNORMAL HIGH (ref 9–46)
AST: 63 U/L — ABNORMAL HIGH (ref 10–40)
Albumin: 4.2 g/dL (ref 3.6–5.1)
Alkaline phosphatase (APISO): 88 U/L (ref 36–130)
BUN: 10 mg/dL (ref 7–25)
CO2: 21 mmol/L (ref 20–32)
Calcium: 9 mg/dL (ref 8.6–10.3)
Chloride: 103 mmol/L (ref 98–110)
Creat: 0.75 mg/dL (ref 0.60–1.35)
GFR, Est African American: 128 mL/min/{1.73_m2} (ref >=60)
GFR, Est Non African American: 110 mL/min/{1.73_m2} (ref >=60)
Globulin: 2.5 g/dL (calc) (ref 1.9–3.7)
Glucose, Bld: 131 mg/dL — ABNORMAL HIGH (ref 65–99)
Potassium: 4.1 mmol/L (ref 3.5–5.3)
Sodium: 139 mmol/L (ref 135–146)
Total Bilirubin: 0.5 mg/dL (ref 0.2–1.2)
Total Protein: 6.7 g/dL (ref 6.1–8.1)

## 2019-01-28 ENCOUNTER — Other Ambulatory Visit: Payer: Self-pay

## 2019-02-02 ENCOUNTER — Ambulatory Visit
Admission: RE | Admit: 2019-02-02 | Discharge: 2019-02-02 | Disposition: A | Discharge status: Home / Self Care | Payer: 59 | Source: Ambulatory Visit | Attending: Family Medicine | Admitting: Family Medicine

## 2019-02-02 DIAGNOSIS — R634 Abnormal weight loss: Secondary | ICD-10-CM

## 2019-02-02 DIAGNOSIS — R945 Abnormal results of liver function studies: Secondary | ICD-10-CM

## 2019-02-02 DIAGNOSIS — R109 Unspecified abdominal pain: Secondary | ICD-10-CM

## 2019-02-02 DIAGNOSIS — K760 Fatty (change of) liver, not elsewhere classified: Secondary | ICD-10-CM | POA: Diagnosis not present

## 2019-02-02 DIAGNOSIS — R7989 Other specified abnormal findings of blood chemistry: Secondary | ICD-10-CM

## 2019-02-18 ENCOUNTER — Encounter: Payer: Self-pay | Admitting: Family Medicine

## 2019-02-18 ENCOUNTER — Ambulatory Visit: Payer: 59 | Admitting: Family Medicine

## 2019-02-18 ENCOUNTER — Other Ambulatory Visit: Payer: Self-pay

## 2019-02-18 DIAGNOSIS — F411 Generalized anxiety disorder: Secondary | ICD-10-CM | POA: Insufficient documentation

## 2019-02-18 DIAGNOSIS — R69 Illness, unspecified: Secondary | ICD-10-CM | POA: Diagnosis not present

## 2019-02-18 DIAGNOSIS — K760 Fatty (change of) liver, not elsewhere classified: Secondary | ICD-10-CM | POA: Insufficient documentation

## 2019-02-18 MED ORDER — LORAZEPAM 0.5 MG PO TABS: 0.5000 mg | ORAL_TABLET | Freq: Two times a day (BID) | ORAL | 3 refills | Status: DC | PRN

## 2019-02-18 NOTE — Patient Instructions (Signed)
F/U  3 months 

## 2019-02-18 NOTE — Progress Notes (Signed)
   Subjective:    Patient ID: Christopher Carrillo, male    DOB: Aug 12, 1970, 48 y.o.   MRN: [id number]  Patient presents for Follow-up, Medication Management, and Medication Refill  Patient here to follow-up medications and his labs.  He had elevated liver function test along with weight loss bloating concern for possible GI etiologyversus underlying stress. He had abdominal ultrasound done which showed hepatic steatosis.  He also cut back on alcohol and his liver function tests did improve from AST 113 down to 63 and ALT went from 123 down to 55.  He is also watching his diet in general.  He has actually gained weight since our last visit states that his appetite has improved he has had less of the bloating nausea episodes after he gets the anxiety.  He has been using lorazepam typically twice a day during the workweek and on rare occasion on the weekend.  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  He sleeps better does not feel is on edge.  He would like to continue with the medication.  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      Review Of Systems:  GEN- denies fatigue, fever, weight loss,weakness, recent illness HEENT- denies eye drainage, change in vision, nasal discharge, CVS- denies chest pain, palpitations RESP- denies SOB, cough, wheeze ABD- denies N/V, change in stools, abd pain GU- denies dysuria, hematuria, dribbling, incontinence MSK- denies joint pain, muscle aches, injury Neuro- denies headache, dizziness, syncope, seizure activity       Objective:    BP (!) 142/88   Pulse 97   Temp 97.6 F (36.4 C) (Other (Comment))   Resp 18   Ht 5' 9"  (1.753 m)   Wt 200 lb (90.7 kg)   SpO2 99%   BMI 29.53 kg/m  GEN- NAD, alert and oriented x3 HEENT- PERRL, EOMI, non injected sclera, pink conjunctiva, MMM, oropharynx clear Neck- Supple, no thyromegaly CVS- RRR, no  murmur RESP-CTAB ABD-NABS,soft,NT,ND Psych normal affect and mood very pleasant EXT- No edema Pulses- Radial 2+        Assessment & Plan:    He will need lipid panel at his next visit. Problem List Items Addressed This Visit      Unprioritized   GAD (generalized anxiety disorder)    He will continue with the lorazepam twice a day as needed.  We will reassess at her next visit how he is doing.  If he finds himself adding a half a tablet or taking consistently every single day then we did discuss starting an SSRI such as Zoloft or Prozac.      Relevant Medications   LORazepam (ATIVAN) 0.5 MG tablet   Hepatic steatosis    Discussed diet avoiding fried foods saturated fats also baked goods and alcohol and how this affects his liver.  We will recheck his liver function test today.      Relevant Orders   Comprehensive metabolic panel      Note: This dictation was prepared with Dragon dictation along with smaller phrase technology. Any transcriptional errors that result from this process are unintentional.

## 2019-02-18 NOTE — Assessment & Plan Note (Signed)
Discussed diet avoiding fried foods saturated fats also baked goods and alcohol and how this affects his liver.  We will recheck his liver function test today.

## 2019-02-18 NOTE — Assessment & Plan Note (Signed)
He will continue with the lorazepam twice a day as needed.  We will reassess at her next visit how he is doing.  If he finds himself adding a half a tablet or taking consistently every single day then we did discuss starting an SSRI such as Zoloft or Prozac.

## 2019-02-20 LAB — COMPREHENSIVE METABOLIC PANEL
AG Ratio: 1.7 (calc) (ref 1.0–2.5)
ALT: 44 U/L (ref 9–46)
AST: 52 U/L — ABNORMAL HIGH (ref 10–40)
Albumin: 4.2 g/dL (ref 3.6–5.1)
Alkaline phosphatase (APISO): 87 U/L (ref 36–130)
BUN: 8 mg/dL (ref 7–25)
CO2: 24 mmol/L (ref 20–32)
Calcium: 8.7 mg/dL (ref 8.6–10.3)
Chloride: 102 mmol/L (ref 98–110)
Creat: 0.77 mg/dL (ref 0.60–1.35)
Globulin: 2.5 g/dL (calc) (ref 1.9–3.7)
Glucose, Bld: 132 mg/dL — ABNORMAL HIGH (ref 65–99)
Potassium: 3.7 mmol/L (ref 3.5–5.3)
Sodium: 139 mmol/L (ref 135–146)
Total Bilirubin: 0.4 mg/dL (ref 0.2–1.2)
Total Protein: 6.7 g/dL (ref 6.1–8.1)

## 2019-05-25 ENCOUNTER — Ambulatory Visit: Payer: 59 | Admitting: Family Medicine

## 2019-05-25 ENCOUNTER — Other Ambulatory Visit: Payer: Self-pay

## 2019-05-25 ENCOUNTER — Encounter: Payer: Self-pay | Admitting: Family Medicine

## 2019-05-25 VITALS — BP 138/86 | HR 90 | Temp 98.5°F | Resp 14 | Ht 69.0 in | Wt 192.0 lb

## 2019-05-25 DIAGNOSIS — E785 Hyperlipidemia, unspecified: Secondary | ICD-10-CM | POA: Diagnosis not present

## 2019-05-25 DIAGNOSIS — F411 Generalized anxiety disorder: Secondary | ICD-10-CM | POA: Diagnosis not present

## 2019-05-25 DIAGNOSIS — R69 Illness, unspecified: Secondary | ICD-10-CM | POA: Diagnosis not present

## 2019-05-25 DIAGNOSIS — K760 Fatty (change of) liver, not elsewhere classified: Secondary | ICD-10-CM

## 2019-05-25 DIAGNOSIS — F321 Major depressive disorder, single episode, moderate: Secondary | ICD-10-CM

## 2019-05-25 DIAGNOSIS — F329 Major depressive disorder, single episode, unspecified: Secondary | ICD-10-CM | POA: Insufficient documentation

## 2019-05-25 MED ORDER — SERTRALINE HCL 25 MG PO TABS: ORAL_TABLET | ORAL | 1 refills | Status: DC

## 2019-05-25 NOTE — Assessment & Plan Note (Addendum)
Fatty liver along with hyperlipidemia.  We will recheck his lipid panel today along with repeat liver function test.  Also discussed recommend that he not drink alcohol with his medications in the setting of his liver disease.  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

## 2019-05-25 NOTE — Progress Notes (Signed)
Subjective:    Patient ID: Christopher Carrillo, male    DOB: 06-Jul-1970, 49 y.o.   MRN: [id number]  Patient presents for Follow-up (is fasting) and Anxiety  Patient here to follow-up medications.  He is also due for repeat lipid panel and liver function test in the setting of his hepatic steatosis.  He has been working on dietary changes states that he had gained weight but is now lost it because of decreased appetite in the setting of his stress.  I actually received a note from his wife via [facility].  He was aware that she has sent his states that they had discussed his depression and anxiety before the appointment.  We repeat this in the office setting.  She took his lorazepam away because he was just falling asleep when he would take it.  Also did not see an improvement in his anxiety or his mood.  He agrees that he would take the medication and would sit down and just fall asleep out of nowhere.  He is also fallen out of bed and hit his head after taking the medication because of drowsiness.  He feels very anxious at times he continues to have a panic attack for first he gets the dry heaves and he starts shaking.  States that he had another one yesterday.  But he is also been very depressed.  He admits that his mood has been low he does not feel like doing anything or interacting with people.  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  He had 4 different trips planned for him and his wife that had to be canceled over the past year secondary to the pandemic.   He does not enjoy wearing his mask or having to work behind plexiglass at work he feels like he is in jail.  Everything just seems overwhelming right now.  He is willing to try different medication to help with his symptoms he is also willing to go to therapy.   Review Of Systems:  GEN- denies fatigue, fever, weight loss,weakness, recent illness HEENT- denies eye drainage, change in  vision, nasal discharge, CVS- denies chest pain, palpitations RESP- denies SOB, cough, wheeze ABD- denies N/V, change in stools, abd pain GU- denies dysuria, hematuria, dribbling, incontinence MSK- denies joint pain, muscle aches, injury Neuro- denies headache, dizziness, syncope, seizure activity       Objective:    BP 138/86   Pulse 90   Temp 98.5 F (36.9 C) (Temporal)   Resp 14   Ht 5' 9"  (1.753 m)   Wt 192 lb (87.1 kg)   SpO2 96%   BMI 28.35 kg/m  GEN- NAD, alert and oriented x3 HEENT- PERRL, EOMI, non injected sclera, pink conjunctiva, CVS- RRR, no murmur RESP-CTAB ABD-NABS,soft,NT,ND Psych depressed affect not anxious appearing fair eye contact no suicidal ideations no apparent hallucinations EXT- No edema Pulses- Radial 2+   CAGE score 7 GAD 7 score 8 PHQ 9 score  5      Assessment & Plan:      Problem List Items Addressed This Visit      Unprioritized   GAD (generalized anxiety disorder)    Generalized anxiety along with major depression symptoms.  We will start him on Zoloft 25 mg will increase to 50 mg in 2 weeks.  He will refer to psychotherapy.  We discussed side effects of the medication.  I agree with discontinuing the lorazepam  due to over somnolence.      Relevant Medications   sertraline (ZOLOFT) 25 MG tablet   Christopher Relevant Orders   Ambulatory referral to Psychology   Hepatic steatosis - Primary   Relevant Orders   Comprehensive metabolic panel   Lipid panel   Hyperlipidemia    Fatty liver along with hyperlipidemia.  We will recheck his lipid panel today along with repeat liver function test.  Also discussed recommend that he not drink alcohol with his medications in the setting of his liver disease.  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 Any transcriptional errors that result from this process are unintentional.

## 2019-05-25 NOTE — Assessment & Plan Note (Signed)
Generalized anxiety along with major depression symptoms.  We will start him on Zoloft 25 mg will increase to 50 mg in 2 weeks.  He will refer to psychotherapy.  We discussed side effects of the medication.  I agree with discontinuing the lorazepam due to over somnolence.

## 2019-05-25 NOTE — Patient Instructions (Signed)
Start zoloft 50m at bedtime, after 2 weeks increase to 2 tablets ( 554m Referral to therapist  We will call with lab results  F/U 3 weeks

## 2019-05-26 LAB — COMPREHENSIVE METABOLIC PANEL
AG Ratio: 1.5 (calc) (ref 1.0–2.5)
ALT: 71 U/L — ABNORMAL HIGH (ref 9–46)
AST: 113 U/L — ABNORMAL HIGH (ref 10–40)
Albumin: 4.4 g/dL (ref 3.6–5.1)
Alkaline phosphatase (APISO): 125 U/L (ref 36–130)
BUN/Creatinine Ratio: 9 (calc) (ref 6–22)
BUN: 6 mg/dL — ABNORMAL LOW (ref 7–25)
CO2: 26 mmol/L (ref 20–32)
Calcium: 8.9 mg/dL (ref 8.6–10.3)
Chloride: 99 mmol/L (ref 98–110)
Creat: 0.64 mg/dL (ref 0.60–1.35)
Globulin: 2.9 g/dL (calc) (ref 1.9–3.7)
Glucose, Bld: 113 mg/dL — ABNORMAL HIGH (ref 65–99)
Potassium: 3.5 mmol/L (ref 3.5–5.3)
Sodium: 141 mmol/L (ref 135–146)
Total Bilirubin: 1.1 mg/dL (ref 0.2–1.2)
Total Protein: 7.3 g/dL (ref 6.1–8.1)

## 2019-05-26 LAB — CBC WITH DIFFERENTIAL/PLATELET
Absolute Monocytes: 783 cells/uL (ref 200–950)
Basophils Absolute: 95 cells/uL (ref 0–200)
Basophils Relative: 2.1 %
Eosinophils Absolute: 86 cells/uL (ref 15–500)
Eosinophils Relative: 1.9 %
HCT: 47.7 % (ref 38.5–50.0)
Hemoglobin: 17.2 g/dL — ABNORMAL HIGH (ref 13.2–17.1)
Lymphs Abs: 1679 cells/uL (ref 850–3900)
MCH: 37.1 pg — ABNORMAL HIGH (ref 27.0–33.0)
MCHC: 36.1 g/dL — ABNORMAL HIGH (ref 32.0–36.0)
MCV: 102.8 fL — ABNORMAL HIGH (ref 80.0–100.0)
MPV: 11.5 fL (ref 7.5–12.5)
Monocytes Relative: 17.4 %
Neutro Abs: 1859 cells/uL (ref 1500–7800)
Neutrophils Relative %: 41.3 %
Platelets: 123 10*3/uL — ABNORMAL LOW (ref 140–400)
RBC: 4.64 10*6/uL (ref 4.20–5.80)
RDW: 14.9 % (ref 11.0–15.0)
Total Lymphocyte: 37.3 %
WBC: 4.5 10*3/uL (ref 3.8–10.8)

## 2019-05-26 LAB — LIPID PANEL
Cholesterol: 303 mg/dL — ABNORMAL HIGH (ref <200)
HDL: 59 mg/dL (ref >=40)
LDL Cholesterol (Calc): 219 mg/dL (calc) — ABNORMAL HIGH
Non-HDL Cholesterol (Calc): 244 mg/dL (calc) — ABNORMAL HIGH (ref <130)
Total CHOL/HDL Ratio: 5.1 (calc) — ABNORMAL HIGH (ref <5.0)
Triglycerides: 111 mg/dL (ref <150)

## 2019-05-29 ENCOUNTER — Other Ambulatory Visit: Payer: Self-pay | Admitting: *Deleted

## 2019-05-29 MED ORDER — ATORVASTATIN CALCIUM 20 MG PO TABS: 20.0000 mg | ORAL_TABLET | Freq: Every day | ORAL | 3 refills | Status: DC

## 2019-06-17 ENCOUNTER — Other Ambulatory Visit: Payer: Self-pay

## 2019-06-17 ENCOUNTER — Ambulatory Visit: Payer: 59 | Admitting: Family Medicine

## 2019-06-17 ENCOUNTER — Encounter: Payer: Self-pay | Admitting: Family Medicine

## 2019-06-17 VITALS — BP 134/78 | HR 94 | Temp 98.3°F | Resp 12 | Ht 69.0 in | Wt 186.0 lb

## 2019-06-17 DIAGNOSIS — K760 Fatty (change of) liver, not elsewhere classified: Secondary | ICD-10-CM | POA: Diagnosis not present

## 2019-06-17 DIAGNOSIS — E785 Hyperlipidemia, unspecified: Secondary | ICD-10-CM

## 2019-06-17 DIAGNOSIS — F321 Major depressive disorder, single episode, moderate: Secondary | ICD-10-CM

## 2019-06-17 DIAGNOSIS — R69 Illness, unspecified: Secondary | ICD-10-CM | POA: Diagnosis not present

## 2019-06-17 DIAGNOSIS — F411 Generalized anxiety disorder: Secondary | ICD-10-CM

## 2019-06-17 NOTE — Progress Notes (Signed)
Subjective:    Patient ID: Christopher Carrillo, male    DOB: [birth year], 49 y.o.   MRN: [id number]  Patient presents for Follow-up (anxiety)  Patient here for interim follow-up on medications.  He was seen about 4 weeks ago secondary to anxiety and major depression.  He was started on Zoloft 25 mg was advised that taper up to 50 mg after 2 weeks.  He was also referred for psychotherapy.  His lorazepam was stopped per previous note as it was causing increased somnolence.  Since then he has stayed on 25 mg of Zoloft has been on this for over the past 3 weeks.  States he did not take it the first week.  He has noticed some improvement in his anxiety but is still having few different anxiety attacks he had one last Wednesday where he was sent home from work.  The only side effect he had noted that his appetite has decreased states that he just feels full faster.  He however also states that he has not had alcohol on board he was drinking 3-4 shots of beers he has cut that down significantly and has had minimal alcohol the past week or 2 so that may also be contributing to his weight change. He is fatigued when he comes home from work and states that he does not notice any fatigue during the daytime at work and he was not sure if this was a side effect of the medication.  Hyperlipidemia his cholesterol returned significantly elevated he had known fatty liver found on ultrasound.  He was started on Lipitor 20 mg at bedtime.  His LDL was 219 total cholesterol 303 and his liver function tested also went back up AST was 113 ALT 71.  We discussed avoiding alcohol at the last visit.    Review Of Systems:  GEN- denies fatigue, fever, weight loss,weakness, recent illness HEENT- denies eye drainage, change in vision, nasal discharge, CVS- denies chest pain, palpitations RESP- denies SOB, cough, wheeze ABD- denies N/V, change in stools, abd pain GU- denies dysuria, hematuria, dribbling, incontinence MSK- denies  joint pain, muscle aches, injury Neuro- denies headache, dizziness, syncope, seizure activity       Objective:    BP 134/78   Pulse 94   Temp 98.3 F (36.8 C) (Temporal)   Resp 12   Ht 5' 9"  (1.753 m)   Wt 186 lb (84.4 kg)   SpO2 98%   BMI 27.47 kg/m  GEN- NAD, alert and oriented x3 HEENT nonicteric extraocular movements intact CVS- RRR, no murmur RESP-CTAB ABD-NABS,soft,NT,ND, no hepatosplenomegaly EXT- No edema Psych normal affect PHQ-9 score 4 previously 5 GAD-7 score 6 previously 8        Assessment & Plan:      Problem List Items Addressed This Visit      Unprioritized   GAD (generalized anxiety disorder)    He has had some improvement in his anxiety episodes with the Zoloft.  Is difficult to tell with regards to the appetite per above and he does not seem to be bothered by the fatigue in the late evening.  I gave him options of trying a different medication versus increasing the Zoloft to the 50 mg and seeing how he adjusts.  We will try 50 mg of Zoloft for 2 weeks if his appetite issues and fatigue worsens that he will need to stop the medication now we will try Prozac.      Hepatic steatosis - Primary    His  weight is down 6 pounds unintentionally however he has cut out alcohol significantly this may be the cause instead of the actual Zoloft.  He is on a statin drug as well.  We will recheck his fasting labs in 1 month since he does recently cut down the alcohol in the past week and will hold off and check his liver function tests in a few weeks.      Hyperlipidemia   MDD (major depressive disorder)      Note: This dictation was prepared with Dragon dictation along with smaller phrase technology. Any transcriptional errors that result from this process are unintentional.

## 2019-06-17 NOTE — Patient Instructions (Signed)
Increase zoloft to  18m at bedtime Call or send mychart message in 2 weeks to see how medication is working F/U 1 month- repeat bloodwork will be done

## 2019-06-17 NOTE — Assessment & Plan Note (Signed)
He has had some improvement in his anxiety episodes with the Zoloft.  Is difficult to tell with regards to the appetite per above and he does not seem to be bothered by the fatigue in the late evening.  I gave him options of trying a different medication versus increasing the Zoloft to the 50 mg and seeing how he adjusts.  We will try 50 mg of Zoloft for 2 weeks if his appetite issues and fatigue worsens that he will need to stop the medication now we will try Prozac.

## 2019-06-17 NOTE — Assessment & Plan Note (Signed)
His weight is down 6 pounds unintentionally however he has cut out alcohol significantly this may be the cause instead of the actual Zoloft.  He is on a statin drug as well.  We will recheck his fasting labs in 1 month since he does recently cut down the alcohol in the past week and will hold off and check his liver function tests in a few weeks.

## 2019-06-26 ENCOUNTER — Ambulatory Visit: Payer: 59 | Attending: Internal Medicine

## 2019-06-26 DIAGNOSIS — Z23 Encounter for immunization: Secondary | ICD-10-CM

## 2019-06-26 NOTE — Progress Notes (Signed)
   [facility]  Name:  Christopher Carrillo    MRN: [id number] DOB: 1970-05-19  06/26/2019  [name] was observed post Covid-19 immunization for 15 minutes without incident. He was provided with Vaccine Information Sheet and instruction to access the V-Safe system.   [name] was instructed to call 911 with any severe reactions post vaccine: Marland Kitchen Difficulty breathing  . Swelling of face and throat  . A fast heartbeat  . A bad rash all over body  . Dizziness and weakness   Immunizations Administered    Name Date Dose VIS Date Route   Moderna COVID-19 Vaccine 06/26/2019 11:31 AM 0.5 mL 03/10/2019 Intramuscular   Manufacturer: Moderna   Lot: [id number]   Ward: [id number]

## 2019-07-08 ENCOUNTER — Encounter (HOSPITAL_COMMUNITY): Payer: Self-pay | Admitting: *Deleted

## 2019-07-08 ENCOUNTER — Ambulatory Visit
Admission: EM | Admit: 2019-07-08 | Discharge: 2019-07-08 | Disposition: A | Discharge status: Home / Self Care | Payer: 59 | Source: Home / Self Care

## 2019-07-08 ENCOUNTER — Other Ambulatory Visit: Payer: Self-pay

## 2019-07-08 ENCOUNTER — Emergency Department (HOSPITAL_COMMUNITY): Payer: 59

## 2019-07-08 ENCOUNTER — Emergency Department (HOSPITAL_COMMUNITY)
Admission: EM | Admit: 2019-07-08 | Discharge: 2019-07-08 | Disposition: A | Discharge status: Home / Self Care | Payer: 59 | Attending: Emergency Medicine | Admitting: Emergency Medicine

## 2019-07-08 DIAGNOSIS — R69 Illness, unspecified: Secondary | ICD-10-CM | POA: Diagnosis not present

## 2019-07-08 DIAGNOSIS — E876 Hypokalemia: Secondary | ICD-10-CM | POA: Diagnosis not present

## 2019-07-08 DIAGNOSIS — I10 Essential (primary) hypertension: Secondary | ICD-10-CM | POA: Diagnosis not present

## 2019-07-08 DIAGNOSIS — R112 Nausea with vomiting, unspecified: Secondary | ICD-10-CM | POA: Diagnosis not present

## 2019-07-08 DIAGNOSIS — R945 Abnormal results of liver function studies: Secondary | ICD-10-CM | POA: Diagnosis not present

## 2019-07-08 DIAGNOSIS — R1031 Right lower quadrant pain: Secondary | ICD-10-CM | POA: Diagnosis not present

## 2019-07-08 DIAGNOSIS — Z79899 Other long term (current) drug therapy: Secondary | ICD-10-CM | POA: Insufficient documentation

## 2019-07-08 DIAGNOSIS — F17210 Nicotine dependence, cigarettes, uncomplicated: Secondary | ICD-10-CM | POA: Insufficient documentation

## 2019-07-08 DIAGNOSIS — R748 Abnormal levels of other serum enzymes: Secondary | ICD-10-CM | POA: Insufficient documentation

## 2019-07-08 DIAGNOSIS — R1110 Vomiting, unspecified: Secondary | ICD-10-CM | POA: Diagnosis not present

## 2019-07-08 LAB — ETHANOL: Alcohol, Ethyl (B): <10 mg/dL (ref <10)

## 2019-07-08 LAB — URINALYSIS, ROUTINE W REFLEX MICROSCOPIC
Bacteria, UA: NONE SEEN
Glucose, UA: NEGATIVE mg/dL
Ketones, ur: 20 mg/dL — AB
Leukocytes,Ua: NEGATIVE
Nitrite: NEGATIVE
Protein, ur: 100 mg/dL — AB
Specific Gravity, Urine: 1.017 (ref 1.005–1.030)
pH: 6 (ref 5.0–8.0)

## 2019-07-08 LAB — COMPREHENSIVE METABOLIC PANEL
ALT: 87 U/L — ABNORMAL HIGH (ref 0–44)
AST: 241 U/L — ABNORMAL HIGH (ref 15–41)
Albumin: 3.5 g/dL (ref 3.5–5.0)
Alkaline Phosphatase: 330 U/L — ABNORMAL HIGH (ref 38–126)
Anion gap: 18 — ABNORMAL HIGH (ref 5–15)
BUN: 6 mg/dL (ref 6–20)
CO2: 27 mmol/L (ref 22–32)
Calcium: 8.7 mg/dL — ABNORMAL LOW (ref 8.9–10.3)
Chloride: 86 mmol/L — ABNORMAL LOW (ref 98–111)
Creatinine, Ser: 0.59 mg/dL — ABNORMAL LOW (ref 0.61–1.24)
GFR calc Af Amer: >60 mL/min (ref >60)
GFR calc non Af Amer: >60 mL/min (ref >60)
Glucose, Bld: 118 mg/dL — ABNORMAL HIGH (ref 70–99)
Potassium: 2.9 mmol/L — ABNORMAL LOW (ref 3.5–5.1)
Sodium: 131 mmol/L — ABNORMAL LOW (ref 135–145)
Total Bilirubin: 3.6 mg/dL — ABNORMAL HIGH (ref 0.3–1.2)
Total Protein: 7.2 g/dL (ref 6.5–8.1)

## 2019-07-08 LAB — RAPID URINE DRUG SCREEN, HOSP PERFORMED
Amphetamines: NOT DETECTED
Barbiturates: NOT DETECTED
Benzodiazepines: NOT DETECTED
Cocaine: NOT DETECTED
Opiates: NOT DETECTED
Tetrahydrocannabinol: NOT DETECTED

## 2019-07-08 LAB — CBC
HCT: 43.6 % (ref 39.0–52.0)
Hemoglobin: 16 g/dL (ref 13.0–17.0)
MCH: 37 pg — ABNORMAL HIGH (ref 26.0–34.0)
MCHC: 36.7 g/dL — ABNORMAL HIGH (ref 30.0–36.0)
MCV: 100.7 fL — ABNORMAL HIGH (ref 80.0–100.0)
Platelets: 154 10*3/uL (ref 150–400)
RBC: 4.33 MIL/uL (ref 4.22–5.81)
RDW: 14.8 % (ref 11.5–15.5)
WBC: 7.1 10*3/uL (ref 4.0–10.5)
nRBC: 0 % (ref 0.0–0.2)

## 2019-07-08 LAB — LIPASE, BLOOD: Lipase: 85 U/L — ABNORMAL HIGH (ref 11–51)

## 2019-07-08 LAB — POC OCCULT BLOOD, ED: Fecal Occult Bld: POSITIVE — AB

## 2019-07-08 MED ORDER — SODIUM CHLORIDE 0.9% FLUSH
3.0000 mL | Freq: Once | INTRAVENOUS | Status: AC
  Administered 2019-07-08: 3 mL via INTRAVENOUS

## 2019-07-08 MED ORDER — IOHEXOL 300 MG/ML  SOLN
100.0000 mL | Freq: Once | INTRAMUSCULAR | Status: AC | PRN
  Administered 2019-07-08: 100 mL via INTRAVENOUS

## 2019-07-08 MED ORDER — OXYCODONE-ACETAMINOPHEN 5-325 MG PO TABS: 2.0000 | ORAL_TABLET | ORAL | 0 refills | Status: DC | PRN

## 2019-07-08 MED ORDER — POTASSIUM CHLORIDE CRYS ER 20 MEQ PO TBCR
40.0000 meq | EXTENDED_RELEASE_TABLET | Freq: Once | ORAL | Status: AC
  Administered 2019-07-08: 40 meq via ORAL
  Filled 2019-07-08: qty 2

## 2019-07-08 MED ORDER — MORPHINE SULFATE (PF) 4 MG/ML IV SOLN
4.0000 mg | Freq: Once | INTRAVENOUS | Status: AC
  Administered 2019-07-08: 4 mg via INTRAVENOUS
  Filled 2019-07-08: qty 1

## 2019-07-08 MED ORDER — POTASSIUM CHLORIDE 10 MEQ/100ML IV SOLN
10.0000 meq | Freq: Once | INTRAVENOUS | Status: AC
  Administered 2019-07-08: 10 meq via INTRAVENOUS
  Filled 2019-07-08: qty 100

## 2019-07-08 MED ORDER — OXYCODONE-ACETAMINOPHEN 5-325 MG PO TABS: 1.0000 | ORAL_TABLET | ORAL | 0 refills | Status: DC | PRN

## 2019-07-08 MED ORDER — POTASSIUM CHLORIDE CRYS ER 20 MEQ PO TBCR: 20.0000 meq | EXTENDED_RELEASE_TABLET | Freq: Two times a day (BID) | ORAL | 0 refills | Status: DC

## 2019-07-08 MED ORDER — ONDANSETRON HCL 4 MG PO TABS: 4.0000 mg | ORAL_TABLET | Freq: Four times a day (QID) | ORAL | 0 refills | Status: DC

## 2019-07-08 NOTE — ED Provider Notes (Signed)
[facility] EMERGENCY DEPARTMENT Provider Note   CSN: [id number] Arrival date & time: 07/08/19  1028     History Chief Complaint  Patient presents with  . Abdominal Pain    Christopher Carrillo is a 49 y.o. male.  HPI      Christopher Carrillo is a 49 y.o. male who drinks alcohol daily, he presents to the Emergency Department complaining of right-sided abdominal pain for several days.  He complains of intermittent episodes of diaphoresis, nausea, and vomiting persisting for 6 months.  He describes having "waves" of feeling sweaty and then becoming nauseous and vomiting.  He was seen by his PCP for this at onset, but a diagnosis has not been established.  He was seen at a local urgent care earlier today and advised to come to the emergency department for further evaluation.  He does admit to daily alcohol use stating that he drinks 3-4 bottles of beer daily.  He denies drug use.  He also notes having dark black to tarry stools for several days and denies taking Pepto-Bismol.  He denies fever, chills, chest pain, shortness of breath, and diarrhea.  No aggravating or alleviating factors.  No abdominal surgeries. He admits to nearly 30 pound weight loss in last 3-4 months.      Past Medical History:  Diagnosis Date  . Psoriasis     Patient Active Problem List   Diagnosis Date Noted  . MDD (major depressive disorder) 05/25/2019  . Hepatic steatosis 02/18/2019  . GAD (generalized anxiety disorder) 02/18/2019  . Atypical chest pain 04/08/2013  . GERD (gastroesophageal reflux disease) 04/08/2013  . Rectal leakage 04/08/2013  . Hyperlipidemia 12/19/2012  . Essential hypertension, benign 12/19/2012  . Routine general medical examination at a health care facility 08/07/2012  . Psoriasis 05/04/2012  . Obese 05/04/2012  . Tobacco use 05/04/2012    History reviewed. No pertinent surgical history.     Family History  Problem Relation Age of Onset  . Asthma Father   . Cancer Sister      Pharyngeal    Social History   Tobacco Use  . Smoking status: Current Every Day Smoker    Packs/day: 0.33    Types: Cigarettes    Last attempt to quit: 03/20/2016    Years since quitting: 3.3  . Smokeless tobacco: Never Used  Substance Use Topics  . Alcohol use: Yes  . Drug use: No    Home Medications Prior to Admission medications   Medication Sig Start Date End Date Taking? Authorizing Provider  atorvastatin (LIPITOR) 20 MG tablet Take 1 tablet (20 mg total) by mouth daily. 05/29/19   Chester, Modena Nunnery, MD  NON FORMULARY CBD oil    [provider]  sertraline (ZOLOFT) 25 MG tablet 1-2 tablets at bedtime Patient taking differently: 25 mg. 1-2 tablets at bedtime 05/25/19   Alycia Rossetti, MD    Allergies    Patient has no known allergies.  Review of Systems   Review of Systems  Constitutional: Positive for diaphoresis, fatigue and unexpected weight change. Negative for appetite change, chills and fever.  Respiratory: Negative for shortness of breath.   Cardiovascular: Negative for chest pain.  Gastrointestinal: Positive for abdominal pain, nausea and vomiting. Negative for blood in stool and diarrhea.  Genitourinary: Negative for decreased urine volume, difficulty urinating, dysuria and flank pain.  Musculoskeletal: Negative for back pain.  Skin: Negative for color change and rash.  Neurological: Negative for dizziness, seizures, syncope, weakness and numbness.  Hematological: Negative  for adenopathy.    Physical Exam Updated Vital Signs BP (!) 144/95 (BP Location: Right Arm)   Pulse 95   Temp 99.4 F (37.4 C) (Oral)   Resp 18   Ht 5' 9"  (1.753 m)   Wt 81.6 kg   SpO2 99%   BMI 26.58 kg/m   Physical Exam Vitals and nursing note reviewed.  Constitutional:      Appearance: He is well-developed. He is not toxic-appearing.  HENT:     Mouth/Throat:     Mouth: Mucous membranes are moist.  Eyes:     General: No scleral icterus.    Extraocular  Movements: Extraocular movements intact.  Cardiovascular:     Rate and Rhythm: Normal rate and regular rhythm.  Pulmonary:     Effort: Pulmonary effort is normal.     Breath sounds: Normal breath sounds.  Chest:     Chest wall: No tenderness.  Abdominal:     Palpations: Abdomen is soft.     Tenderness: There is abdominal tenderness in the right upper quadrant and right lower quadrant. There is no right CVA tenderness or left CVA tenderness. Negative signs include McBurney's sign.     Comments: Pt with ttp of the right upper and lower abdomen.  No palpable masses, no guarding or rebound tenderness.    Genitourinary:    Rectum: No tenderness, anal fissure or external hemorrhoid. Normal anal tone.     Comments: DRE performed.  No significant stool in the rectal vault for testing.  No palpable rectal masses.   Musculoskeletal:        General: Normal range of motion.     Right lower leg: No edema.     Left lower leg: No edema.  Skin:    Capillary Refill: Capillary refill takes less than 2 seconds.     Findings: No rash.  Neurological:     General: No focal deficit present.     Mental Status: He is alert.     Sensory: No sensory deficit.     ED Results / Procedures / Treatments   Labs (all labs ordered are listed, but only abnormal results are displayed) Labs Reviewed  LIPASE, BLOOD - Abnormal; Notable for the following components:      Result Value   Lipase 85 (*)    All other components within normal limits  COMPREHENSIVE METABOLIC PANEL - Abnormal; Notable for the following components:   Sodium 131 (*)    Potassium 2.9 (*)    Chloride 86 (*)    Glucose, Bld 118 (*)    Creatinine, Ser 0.59 (*)    Calcium 8.7 (*)    AST 241 (*)    ALT 87 (*)    Alkaline Phosphatase 330 (*)    Total Bilirubin 3.6 (*)    Anion gap 18 (*)    All other components within normal limits  CBC - Abnormal; Notable for the following components:   MCV 100.7 (*)    MCH 37.0 (*)    MCHC 36.7 (*)     All other components within normal limits  URINALYSIS, ROUTINE W REFLEX MICROSCOPIC  ETHANOL  RAPID URINE DRUG SCREEN, HOSP PERFORMED  HEPATITIS PANEL, ACUTE  POC OCCULT BLOOD, ED    EKG None  Radiology CT ABDOMEN PELVIS W CONTRAST  Result Date: 07/08/2019 CLINICAL DATA:  Right-sided abdominal pain, nausea and vomiting, elevated liver enzymes EXAM: CT ABDOMEN AND PELVIS WITH CONTRAST TECHNIQUE: Multidetector CT imaging of the abdomen and pelvis was performed using  the standard protocol following bolus administration of intravenous contrast. CONTRAST:  139m OMNIPAQUE IOHEXOL 300 MG/ML  SOLN COMPARISON:  02/02/2019 FINDINGS: Lower chest: No acute pleural or parenchymal lung disease. Hepatobiliary: Diffuse hepatic steatosis without focal abnormality. The gallbladder is unremarkable. Pancreas: Unremarkable. No pancreatic ductal dilatation or surrounding inflammatory changes. Spleen: Normal in size without focal abnormality. Adrenals/Urinary Tract: Adrenal glands are unremarkable. Kidneys are normal, without renal calculi, focal lesion, or hydronephrosis. Bladder is unremarkable. Stomach/Bowel: A normal retrocecal appendix is identified. There is masslike thickening within the cecum at the ileocecal valve, measuring 4.4 x 3.1 x 4.8 cm. Neoplasm cannot be excluded, and colonoscopy is recommended for further evaluation. There is no bowel obstruction or ileus. Minimal distal colonic diverticulosis without diverticulitis. Vascular/Lymphatic: Aortic atherosclerosis. No enlarged abdominal or pelvic lymph nodes. Reproductive: Prostate is unremarkable. Other: There several subcentimeter lymph nodes in the right lower quadrant mesentery, nonspecific. I do not see any pathologic adenopathy within the abdomen or pelvis. No free intra-abdominal gas. Musculoskeletal: No acute or destructive bony lesions. Reconstructed images demonstrate no additional findings. IMPRESSION: 1. Cecal mass at the level of the ileocecal  valve, concerning for neoplasm. Colonoscopy is recommended for further evaluation. 2. Diffuse hepatic steatosis without focal abnormality. Electronically Signed   By: MRanda NgoM.D.   On: 07/08/2019 20:18  CT ABDOMEN PELVIS W CONTRAST  Result Date: 07/08/2019 CLINICAL DATA:  Right-sided abdominal pain, nausea and vomiting, elevated liver enzymes EXAM: CT ABDOMEN AND PELVIS WITH CONTRAST TECHNIQUE: Multidetector CT imaging of the abdomen and pelvis was performed using the standard protocol following bolus administration of intravenous contrast. CONTRAST:  1083mOMNIPAQUE IOHEXOL 300 MG/ML  SOLN COMPARISON:  02/02/2019 FINDINGS: Lower chest: No acute pleural or parenchymal lung disease. Hepatobiliary: Diffuse hepatic steatosis without focal abnormality. The gallbladder is unremarkable. Pancreas: Unremarkable. No pancreatic ductal dilatation or surrounding inflammatory changes. Spleen: Normal in size without focal abnormality. Adrenals/Urinary Tract: Adrenal glands are unremarkable. Kidneys are normal, without renal calculi, focal lesion, or hydronephrosis. Bladder is unremarkable. Stomach/Bowel: A normal retrocecal appendix is identified. There is masslike thickening within the cecum at the ileocecal valve, measuring 4.4 x 3.1 x 4.8 cm. Neoplasm cannot be excluded, and colonoscopy is recommended for further evaluation. There is no bowel obstruction or ileus. Minimal distal colonic diverticulosis without diverticulitis. Vascular/Lymphatic: Aortic atherosclerosis. No enlarged abdominal or pelvic lymph nodes. Reproductive: Prostate is unremarkable. Other: There several subcentimeter lymph nodes in the right lower quadrant mesentery, nonspecific. I do not see any pathologic adenopathy within the abdomen or pelvis. No free intra-abdominal gas. Musculoskeletal: No acute or destructive bony lesions. Reconstructed images demonstrate no additional findings. IMPRESSION: 1. Cecal mass at the level of the ileocecal valve,  concerning for neoplasm. Colonoscopy is recommended for further evaluation. 2. Diffuse hepatic steatosis without focal abnormality. Electronically Signed   By: MiRanda Ngo.D.   On: 07/08/2019 20:18     Procedures Procedures (including critical care time)  Medications Ordered in ED Medications  potassium chloride 10 mEq in 100 mL IVPB (has no administration in time range)  potassium chloride SA (KLOR-CON) CR tablet 40 mEq (has no administration in time range)  sodium chloride flush (NS) 0.9 % injection 3 mL (3 mLs Intravenous Given 07/08/19 1613)    ED Course  I have reviewed the triage vital signs and the nursing notes.  Pertinent labs & imaging results that were available during my care of the patient were reviewed by me and considered in my medical decision making (see chart for details).  MDM Rules/Calculators/A&P                      Patient with right upper and lower abdominal pain waxing and waning for several days also endorses history of intermittent episodes of nausea with vomiting and diaphoresis for 6 months.  On review of medical records, patient was seen by PCP in October 2020 and had abdominal ultrasound that showed fatty liver without gallbladder disease. Today, patient has elevated liver enzymes and total bilirubin is elevated at 3.6, lipase also mildly elevated.  Patient does admit to daily alcohol use.  He is also noted to be hypokalemic with potassium of 2.9.  Plan today will include will replenish potassium, acute hepatitis panel ordered and will obtain CT of the abdomen pelvis.   CT findings show cecal mass concerning for neoplasm.  Elevated liver enzymes possibly related to chronic alcohol use, hepatitis panel pending.  Consulted GI, Dr. Gala Romney and discussed findings.  He agrees to see pt in office and schedule for colonoscopy.    Final Clinical Impression(s) / ED Diagnoses Final diagnoses:  Right lower quadrant abdominal pain  Hypokalemia  Elevated liver  enzymes    Rx / DC Orders ED Discharge Orders    None       Kem Parkinson, PA-C 07/10/19 1333    Milton Ferguson, MD 07/10/19 1540

## 2019-07-08 NOTE — ED Triage Notes (Signed)
Pt presents with c/o right lower quadrant pain and report of dark tarry stools for past couple of months, pt also reports dry heaves since September

## 2019-07-08 NOTE — ED Triage Notes (Signed)
Pt states he is having right lower abdominal pain with dry heaves and black tarry stools; pt states he has episodes of dry heaves, breaking out in a sweat and "shaking" about 4 times a week

## 2019-07-08 NOTE — ED Notes (Signed)
Due to symptoms pt advised to go to ED for higher level of care

## 2019-07-08 NOTE — Discharge Instructions (Signed)
As discussed, your CT this evening shows a mass in your colon.  You will need a colonoscopy for further evaluation.  Someone from Dr. Guerry Minors office will contact you tomorrow to schedule follow-up.  Also, take the potassium as directed.

## 2019-07-09 ENCOUNTER — Telehealth: Payer: Self-pay

## 2019-07-09 LAB — HEPATITIS PANEL, ACUTE
HCV Ab: NONREACTIVE
Hep A IgM: NONREACTIVE
Hep B C IgM: NONREACTIVE
Hepatitis B Surface Ag: NONREACTIVE

## 2019-07-09 MED FILL — Oxycodone w/ Acetaminophen Tab 5-325 MG: ORAL | Qty: 6 | Status: AC

## 2019-07-09 NOTE — Telephone Encounter (Signed)
Pt was referred by [facility]. Can we accept him as a new patient.

## 2019-07-13 NOTE — Telephone Encounter (Signed)
Ok to schedule new pt ov.

## 2019-07-13 NOTE — Telephone Encounter (Signed)
OV made °

## 2019-07-14 ENCOUNTER — Other Ambulatory Visit: Payer: Self-pay

## 2019-07-14 ENCOUNTER — Encounter: Payer: Self-pay | Admitting: Nurse Practitioner

## 2019-07-14 ENCOUNTER — Ambulatory Visit: Payer: 59 | Admitting: Nurse Practitioner

## 2019-07-14 ENCOUNTER — Encounter: Payer: Self-pay | Admitting: *Deleted

## 2019-07-14 ENCOUNTER — Telehealth: Payer: Self-pay | Admitting: *Deleted

## 2019-07-14 DIAGNOSIS — K6389 Other specified diseases of intestine: Secondary | ICD-10-CM | POA: Diagnosis not present

## 2019-07-14 DIAGNOSIS — R7989 Other specified abnormal findings of blood chemistry: Secondary | ICD-10-CM | POA: Diagnosis not present

## 2019-07-14 NOTE — Progress Notes (Signed)
CC'ED TO PCP 

## 2019-07-14 NOTE — Assessment & Plan Note (Addendum)
The patient notes elevated LFTs for the past 6 months.  On exam today he does have pretty significant hepatomegaly consistent with CT imaging of overt hepatic steatosis.  In emergency room they were significantly elevated as per HPI.  There are multiple possible differentials.  He does drink moderately (3-4 beers a day) and earlier in his 20s and 30s he drank more heavily.  However, I do not think moderate alcohol intake would likely result in LFTs as elevated as they were.  He does have a colon mass on CT which we are promptly evaluating with a colonoscopy and we must consider the possibility of liver metastasis in the differentials, despite no obvious mass on CT imaging.  He also has psoriasis and there is a chance of possible couple autoimmune diseases, especially given the degree of elevated alkaline phosphatase.  His acute hepatitis panel was negative.  At this point I will further evaluate including BMP, HFP, INR, ferritin, ceruloplasmin, ANA, SMA, AMA.  I recommended he stop drinking alcohol for now while we are evaluating his liver and he agrees.  We will follow-up in 4 months.  Further recommendations to follow.

## 2019-07-14 NOTE — H&P (View-Only) (Signed)
Primary Care Physician:  Alycia Rossetti, MD Primary Gastroenterologist:  Dr. Gala Romney  Chief Complaint  Patient presents with  . Abdominal Pain    rlq    HPI:   Christopher Carrillo is a 49 y.o. male who presents on referral from the emergency department for abdominal pain.  The patient initially presented to urgent care but was advised to proceed to the ED for higher level of care.  The patient presented to the emergency department on 07/08/2019.  At that time he notes he drinks alcohol daily and was complaining of right-sided abdominal pain for several days with intermittent diaphoresis, nausea, vomiting for the previous 6 months.  Has "waves of feeling sweaty" and becomes nauseous and vomits.  Seen by primary care but diagnosis not established.  He drinks 3-4 bottles of beer daily.  No drug use.  Noted dark black to tarry stools for several days and denies Pepto-Bismol or iron intake.  Recent 30 pound weight loss in the last 3 to 4 months.  No other overt GI complaints.  On exam vitals are stable, abdominal tenderness in the right upper quadrant and right lower quadrant.  DRE performed without significant stool in the rectal vault for testing, no palpable rectal masses.  Labs completed in the emergency department included urinalysis which noted small amount of bilirubin but no sign of infection.  Lipase mildly elevated at 85.  CMP with hyponatremia at 131, hypokalemia at 2.9, transaminitis with AST/ALT at 241/87, alkaline phosphatase 330, total bilirubin at 3.6.  CBC without leukocytosis and normal hemoglobin at 16.0.  Platelets borderline normal at 154.  Ethanol negative.  UDS negative.  Occult blood fecal exam was positive.  Acute hepatitis panel negative.  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  Diffuse hepatic steatosis without focal abnormality.  GI was consulted and agreed to see  patient in the office to schedule colonoscopy.  Today he states he's doing ok overall. Has had unintentional weight loss of about 28 lbs in 7 months. Went days where he couldn't eat, but appetite is coming back. Eating was associated with nausea and vomiting. Abdominal pain is improved on pain medication. Normally a dull pain, onstant, worse/sharop with pressing. Has been having bloating that occurred during the day. Having episodes of sweating, N/V. Has been better since KCl replacement. Currently drinks about 3-4 beers a day, depending on activly. When he was younger (20s-30's) he drink heavier, more than 6 drinks at a time (mostly weekends). His wife has had him using a "liver cleanse" which is a liquid medication dropper into water. No history of IV drug use. Liver enzymes have been elevated over the past 6 months. Denies hematochezia, fever, chills. Over the past few months stools have been "black as tar." Denies GERD symptoms, denies NSAIDs/ASA powders. Denies URI or flu-like symptoms. Denies loss of sense of taste or smell. Had the first dose of COVID-19 vaccine, has appointment 07/29/19. Denies chest pain, dyspnea, dizziness, lightheadedness, syncope, near syncope. Denies any other upper or lower GI symptoms.  Past Medical History:  Diagnosis Date  . Psoriasis     No past surgical history on file.  Current Outpatient Medications  Medication Sig Dispense Refill  . oxyCODONE-acetaminophen (PERCOCET/ROXICET) 5-325 MG tablet Take 1 tablet by mouth every 4 (four) hours as needed. 8 tablet 0  . potassium chloride SA (KLOR-CON) 20 MEQ tablet Take 1 tablet (  20 mEq total) by mouth 2 (two) times daily. 10 tablet 0  . atorvastatin (LIPITOR) 20 MG tablet Take 1 tablet (20 mg total) by mouth daily. (Patient not taking: Reported on 07/08/2019) 90 tablet 3  . ondansetron (ZOFRAN) 4 MG tablet Take 1 tablet (4 mg total) by mouth every 6 (six) hours. (Patient not taking: Reported on 07/14/2019) 12 tablet 0  .  oxyCODONE-acetaminophen (PERCOCET/ROXICET) 5-325 MG tablet Take 2 tablets by mouth every 4 (four) hours as needed for severe pain. (Patient not taking: Reported on 07/14/2019) 6 tablet 0  . sertraline (ZOLOFT) 25 MG tablet 1-2 tablets at bedtime (Patient not taking: Reported on 07/14/2019) 60 tablet 1   No current facility-administered medications for this visit.    Allergies as of 07/14/2019  . (No Known Allergies)    Family History  Problem Relation Age of Onset  . Asthma Father   . Cancer Sister        Pharyngeal    Social History   Socioeconomic History  . Marital status: Married    Spouse name: Not on file  . Number of children: Not on file  . Years of education: Not on file  . Highest education level: Not on file  Occupational History  . Not on file  Tobacco Use  . Smoking status: Current Every Day Smoker    Packs/day: 0.33    Types: Cigarettes    Last attempt to quit: 03/20/2016    Years since quitting: 3.3  . Smokeless tobacco: Never Used  Substance and Sexual Activity  . Alcohol use: Yes    Comment: 3-4 beers/night  . Drug use: No  . Sexual activity: Yes  Other Topics Concern  . Not on file  Social History Narrative  . Not on file   Social Determinants of Health   Financial Resource Strain:   . Difficulty of Paying Living Expenses:   Food Insecurity:   . Worried About Charity fundraiser in the Last Year:   . Arboriculturist in the Last Year:   Transportation Needs:   . Film/video editor (Medical):   Marland Kitchen Lack of Transportation (Non-Medical):   Physical Activity:   . Days of Exercise per Week:   . Minutes of Exercise per Session:   Stress:   . Feeling of Stress :   Social Connections:   . Frequency of Communication with Friends and Family:   . Frequency of Social Gatherings with Friends and Family:   . Attends Religious Services:   . Active Member of Clubs or Organizations:   . Attends Archivist Meetings:   Marland Kitchen Marital Status:     Intimate Partner Violence:   . Fear of Current or Ex-Partner:   . Emotionally Abused:   Marland Kitchen Physically Abused:   . Sexually Abused:     Subjective: Review of Systems  Constitutional: Positive for diaphoresis (improved), malaise/fatigue (improved) and weight loss. Negative for chills and fever.  HENT: Negative for congestion and sore throat.   Respiratory: Negative for cough and shortness of breath.   Cardiovascular: Negative for chest pain and palpitations.  Gastrointestinal: Positive for abdominal pain (improved), melena, nausea (miproved) and vomiting (improved). Negative for blood in stool and diarrhea.  Musculoskeletal: Negative for joint pain and myalgias.  Skin: Negative for rash.  Neurological: Negative for dizziness and weakness.  Endo/Heme/Allergies: Does not bruise/bleed easily.  Psychiatric/Behavioral: Negative for depression. The patient is not nervous/anxious.   All other systems reviewed and are negative.  Objective: BP (!) 129/91   Pulse 100   Temp (!) 97.1 F (36.2 C) (Temporal)   Ht 5' 9"  (1.753 m)   Wt 183 lb (83 kg)   BMI 27.02 kg/m  Physical Exam Vitals and nursing note reviewed.  Constitutional:      General: He is not in acute distress.    Appearance: Normal appearance. He is well-developed and normal weight. He is not ill-appearing, toxic-appearing or diaphoretic.  HENT:     Head: Normocephalic and atraumatic.     Nose: No congestion or rhinorrhea.  Eyes:     General: No scleral icterus. Cardiovascular:     Rate and Rhythm: Normal rate and regular rhythm.     Heart sounds: Normal heart sounds.  Pulmonary:     Effort: Pulmonary effort is normal. No respiratory distress.     Breath sounds: Normal breath sounds.  Abdominal:     General: Abdomen is protuberant. Bowel sounds are normal. There is no distension.     Palpations: Abdomen is soft. There is hepatomegaly. There is no splenomegaly or mass.     Tenderness: There is abdominal tenderness  in the right lower quadrant. There is no guarding or rebound.     Hernia: No hernia is present.  Musculoskeletal:     Cervical back: Neck supple.  Skin:    General: Skin is warm and dry.     Coloration: Skin is not jaundiced.     Findings: No bruising or rash.  Neurological:     General: No focal deficit present.     Mental Status: He is alert and oriented to person, place, and time. Mental status is at baseline.  Psychiatric:        Mood and Affect: Mood normal.        Behavior: Behavior normal.        Thought Content: Thought content normal.       07/14/2019 10:07 AM   Disclaimer: This note was dictated with voice recognition software. Similar sounding words can inadvertently be transcribed and may not be corrected upon review.

## 2019-07-14 NOTE — Assessment & Plan Note (Signed)
CT imaging in the emergency department found a cecal mass at the level of the ileocecal valve concerning for neoplasm and recommended colonoscopy.  His abdominal pain in the right lower quadrant is significantly improved.  He does have tenderness on palpation today.  No overt mass appreciated.  We discussed the possibilities that there could be cancer or CT artifact and a colonoscopy is necessary to further evaluate.  I also recommended that he have this done as soon as possible.  We are holding slots to try to get him scoped within the next week.  Further recommendations to follow.  Proceed with TCS on propofol/MAC with Dr. Gala Romney in near future: the risks, benefits, and alternatives have been discussed with the patient in detail. The patient states understanding and desires to proceed.  The patient is on pain medications right now for his abdominal pain.  Heavy alcohol intake in his 20s and 30s, regular alcohol intake currently 3-4 beers a day.  Denies recreational drug use.The patient is not on any other anticoagulants, anxiolytics, chronic pain medications, antidepressants, antidiabetics, or iron supplements.  We will plan for the procedure on propofol/MAC to promote adequate sedation.

## 2019-07-14 NOTE — Telephone Encounter (Signed)
Called patient and made aware of pre-op/covid testing appt details/location. Also aware this is on his [facility]

## 2019-07-14 NOTE — Progress Notes (Signed)
Primary Care Physician:  Alycia Rossetti, MD Primary Gastroenterologist:  Dr. Gala Romney  Chief Complaint  Patient presents with  . Abdominal Pain    rlq    HPI:   Christopher Carrillo is a 49 y.o. male who presents on referral from the emergency department for abdominal pain.  The patient initially presented to urgent care but was advised to proceed to the ED for higher level of care.  The patient presented to the emergency department on 07/08/2019.  At that time he notes he drinks alcohol daily and was complaining of right-sided abdominal pain for several days with intermittent diaphoresis, nausea, vomiting for the previous 6 months.  Has "waves of feeling sweaty" and becomes nauseous and vomits.  Seen by primary care but diagnosis not established.  He drinks 3-4 bottles of beer daily.  No drug use.  Noted dark black to tarry stools for several days and denies Pepto-Bismol or iron intake.  Recent 30 pound weight loss in the last 3 to 4 months.  No other overt GI complaints.  On exam vitals are stable, abdominal tenderness in the right upper quadrant and right lower quadrant.  DRE performed without significant stool in the rectal vault for testing, no palpable rectal masses.  Labs completed in the emergency department included urinalysis which noted small amount of bilirubin but no sign of infection.  Lipase mildly elevated at 85.  CMP with hyponatremia at 131, hypokalemia at 2.9, transaminitis with AST/ALT at 241/87, alkaline phosphatase 330, total bilirubin at 3.6.  CBC without leukocytosis and normal hemoglobin at 16.0.  Platelets borderline normal at 154.  Ethanol negative.  UDS negative.  Occult blood fecal exam was positive.  Acute hepatitis panel negative.  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  Diffuse hepatic steatosis without focal abnormality.  GI was consulted and agreed to see  patient in the office to schedule colonoscopy.  Today he states he's doing ok overall. Has had unintentional weight loss of about 28 lbs in 7 months. Went days where he couldn't eat, but appetite is coming back. Eating was associated with nausea and vomiting. Abdominal pain is improved on pain medication. Normally a dull pain, onstant, worse/sharop with pressing. Has been having bloating that occurred during the day. Having episodes of sweating, N/V. Has been better since KCl replacement. Currently drinks about 3-4 beers a day, depending on activly. When he was younger (20s-30's) he drink heavier, more than 6 drinks at a time (mostly weekends). His wife has had him using a "liver cleanse" which is a liquid medication dropper into water. No history of IV drug use. Liver enzymes have been elevated over the past 6 months. Denies hematochezia, fever, chills. Over the past few months stools have been "black as tar." Denies GERD symptoms, denies NSAIDs/ASA powders. Denies URI or flu-like symptoms. Denies loss of sense of taste or smell. Had the first dose of COVID-19 vaccine, has appointment 07/29/19. Denies chest pain, dyspnea, dizziness, lightheadedness, syncope, near syncope. Denies any other upper or lower GI symptoms.  Past Medical History:  Diagnosis Date  . Psoriasis     No past surgical history on file.  Current Outpatient Medications  Medication Sig Dispense Refill  . oxyCODONE-acetaminophen (PERCOCET/ROXICET) 5-325 MG tablet Take 1 tablet by mouth every 4 (four) hours as needed. 8 tablet 0  . potassium chloride SA (KLOR-CON) 20 MEQ tablet Take 1 tablet (  20 mEq total) by mouth 2 (two) times daily. 10 tablet 0  . atorvastatin (LIPITOR) 20 MG tablet Take 1 tablet (20 mg total) by mouth daily. (Patient not taking: Reported on 07/08/2019) 90 tablet 3  . ondansetron (ZOFRAN) 4 MG tablet Take 1 tablet (4 mg total) by mouth every 6 (six) hours. (Patient not taking: Reported on 07/14/2019) 12 tablet 0  .  oxyCODONE-acetaminophen (PERCOCET/ROXICET) 5-325 MG tablet Take 2 tablets by mouth every 4 (four) hours as needed for severe pain. (Patient not taking: Reported on 07/14/2019) 6 tablet 0  . sertraline (ZOLOFT) 25 MG tablet 1-2 tablets at bedtime (Patient not taking: Reported on 07/14/2019) 60 tablet 1   No current facility-administered medications for this visit.    Allergies as of 07/14/2019  . (No Known Allergies)    Family History  Problem Relation Age of Onset  . Asthma Father   . Cancer Sister        Pharyngeal    Social History   Socioeconomic History  . Marital status: Married    Spouse name: Not on file  . Number of children: Not on file  . Years of education: Not on file  . Highest education level: Not on file  Occupational History  . Not on file  Tobacco Use  . Smoking status: Current Every Day Smoker    Packs/day: 0.33    Types: Cigarettes    Last attempt to quit: 03/20/2016    Years since quitting: 3.3  . Smokeless tobacco: Never Used  Substance and Sexual Activity  . Alcohol use: Yes    Comment: 3-4 beers/night  . Drug use: No  . Sexual activity: Yes  Other Topics Concern  . Not on file  Social History Narrative  . Not on file   Social Determinants of Health   Financial Resource Strain:   . Difficulty of Paying Living Expenses:   Food Insecurity:   . Worried About Charity fundraiser in the Last Year:   . Arboriculturist in the Last Year:   Transportation Needs:   . Film/video editor (Medical):   Marland Kitchen Lack of Transportation (Non-Medical):   Physical Activity:   . Days of Exercise per Week:   . Minutes of Exercise per Session:   Stress:   . Feeling of Stress :   Social Connections:   . Frequency of Communication with Friends and Family:   . Frequency of Social Gatherings with Friends and Family:   . Attends Religious Services:   . Active Member of Clubs or Organizations:   . Attends Archivist Meetings:   Marland Kitchen Marital Status:     Intimate Partner Violence:   . Fear of Current or Ex-Partner:   . Emotionally Abused:   Marland Kitchen Physically Abused:   . Sexually Abused:     Subjective: Review of Systems  Constitutional: Positive for diaphoresis (improved), malaise/fatigue (improved) and weight loss. Negative for chills and fever.  HENT: Negative for congestion and sore throat.   Respiratory: Negative for cough and shortness of breath.   Cardiovascular: Negative for chest pain and palpitations.  Gastrointestinal: Positive for abdominal pain (improved), melena, nausea (miproved) and vomiting (improved). Negative for blood in stool and diarrhea.  Musculoskeletal: Negative for joint pain and myalgias.  Skin: Negative for rash.  Neurological: Negative for dizziness and weakness.  Endo/Heme/Allergies: Does not bruise/bleed easily.  Psychiatric/Behavioral: Negative for depression. The patient is not nervous/anxious.   All other systems reviewed and are negative.  Objective: BP (!) 129/91   Pulse 100   Temp (!) 97.1 F (36.2 C) (Temporal)   Ht 5' 9"  (1.753 m)   Wt 183 lb (83 kg)   BMI 27.02 kg/m  Physical Exam Vitals and nursing note reviewed.  Constitutional:      General: He is not in acute distress.    Appearance: Normal appearance. He is well-developed and normal weight. He is not ill-appearing, toxic-appearing or diaphoretic.  HENT:     Head: Normocephalic and atraumatic.     Nose: No congestion or rhinorrhea.  Eyes:     General: No scleral icterus. Cardiovascular:     Rate and Rhythm: Normal rate and regular rhythm.     Heart sounds: Normal heart sounds.  Pulmonary:     Effort: Pulmonary effort is normal. No respiratory distress.     Breath sounds: Normal breath sounds.  Abdominal:     General: Abdomen is protuberant. Bowel sounds are normal. There is no distension.     Palpations: Abdomen is soft. There is hepatomegaly. There is no splenomegaly or mass.     Tenderness: There is abdominal tenderness  in the right lower quadrant. There is no guarding or rebound.     Hernia: No hernia is present.  Musculoskeletal:     Cervical back: Neck supple.  Skin:    General: Skin is warm and dry.     Coloration: Skin is not jaundiced.     Findings: No bruising or rash.  Neurological:     General: No focal deficit present.     Mental Status: He is alert and oriented to person, place, and time. Mental status is at baseline.  Psychiatric:        Mood and Affect: Mood normal.        Behavior: Behavior normal.        Thought Content: Thought content normal.       07/14/2019 10:07 AM   Disclaimer: This note was dictated with voice recognition software. Similar sounding words can inadvertently be transcribed and may not be corrected upon review.

## 2019-07-14 NOTE — Patient Instructions (Addendum)
Your health issues we discussed today were:   Abnormal CT of your colon: 1. As we discussed, there is a possibility of a colon mass versus "artifact" on the CT 2. We will complete a colonoscopy to further evaluate the inside of your colon for any abnormalities 3. Further recommendations will follow 4. Call us if you have any worsening or severe symptoms  Elevated liver enzymes: 1. Have your labs completed as soon as you can to help evaluate for possible causes of your liver enzymes being elevated 2. Further recommendations will follow your results 3. As we discussed, stop drinking alcohol for now until we can complete the evaluation of your liver 4. Stop taking "liver cleanse" and other supplements, herbs, etc. until we can evaluate your liver  Overall I recommend:  1. Continue your other current medications 2. Return for follow-up in 4 weeks 3. Call us if you have any questions or concerns   ---------------------------------------------------------------  I am glad you have gotten your COVID-19 vaccine.  Keep your appointment for your second dose.  Even when you are fully vaccinated you should wear a mask, socially distance from people you do not live with, and wash your hands frequently  ---------------------------------------------------------------   At [facility] we value your feedback. You may receive a survey about your visit today. Please share your experience as we strive to create trusting relationships with our patients to provide genuine, compassionate, quality care.  We appreciate your understanding and patience as we review any laboratory studies, imaging, and other diagnostic tests that are ordered as we care for you. Our office policy is 5 business days for review of these results, and any emergent or urgent results are addressed in a timely manner for your best interest. If you do not hear from our office in 1 week, please contact us.   We also  encourage the use of MyChart, which contains your medical information for your review as well. If you are not enrolled in this feature, an access code is on this after visit summary for your convenience. Thank you for allowing [place] to be involved in your care.  It was great to see you today!  I hope you have a great summer!!

## 2019-07-17 ENCOUNTER — Other Ambulatory Visit: Payer: Self-pay

## 2019-07-17 ENCOUNTER — Other Ambulatory Visit (HOSPITAL_COMMUNITY)
Admission: RE | Admit: 2019-07-17 | Discharge: 2019-07-17 | Disposition: A | Discharge status: Home / Self Care | Payer: 59 | Source: Ambulatory Visit | Attending: Internal Medicine | Admitting: Internal Medicine

## 2019-07-17 ENCOUNTER — Encounter (HOSPITAL_COMMUNITY)
Admission: RE | Admit: 2019-07-17 | Discharge: 2019-07-17 | Disposition: A | Discharge status: Home / Self Care | Payer: 59 | Source: Ambulatory Visit | Attending: Internal Medicine | Admitting: Internal Medicine

## 2019-07-17 DIAGNOSIS — Z01812 Encounter for preprocedural laboratory examination: Secondary | ICD-10-CM | POA: Diagnosis not present

## 2019-07-17 DIAGNOSIS — Z20822 Contact with and (suspected) exposure to covid-19: Secondary | ICD-10-CM | POA: Diagnosis not present

## 2019-07-18 LAB — SARS CORONAVIRUS 2 (TAT 6-24 HRS): SARS Coronavirus 2: NEGATIVE

## 2019-07-19 LAB — BASIC METABOLIC PANEL
BUN/Creatinine Ratio: 11 (calc) (ref 6–22)
BUN: 5 mg/dL — ABNORMAL LOW (ref 7–25)
CO2: 29 mmol/L (ref 20–32)
Calcium: 8.1 mg/dL — ABNORMAL LOW (ref 8.6–10.3)
Chloride: 94 mmol/L — ABNORMAL LOW (ref 98–110)
Creat: 0.46 mg/dL — ABNORMAL LOW (ref 0.60–1.35)
Glucose, Bld: 99 mg/dL (ref 65–139)
Potassium: 3.7 mmol/L (ref 3.5–5.3)
Sodium: 135 mmol/L (ref 135–146)

## 2019-07-19 LAB — HEPATIC FUNCTION PANEL
AG Ratio: 1.1 (calc) (ref 1.0–2.5)
ALT: 70 U/L — ABNORMAL HIGH (ref 9–46)
AST: 200 U/L — ABNORMAL HIGH (ref 10–40)
Albumin: 3.2 g/dL — ABNORMAL LOW (ref 3.6–5.1)
Alkaline phosphatase (APISO): 454 U/L — ABNORMAL HIGH (ref 36–130)
Bilirubin, Direct: 1.8 mg/dL — ABNORMAL HIGH (ref 0.0–0.2)
Globulin: 2.9 g/dL (calc) (ref 1.9–3.7)
Indirect Bilirubin: 1.3 mg/dL (calc) — ABNORMAL HIGH (ref 0.2–1.2)
Total Bilirubin: 3.1 mg/dL — ABNORMAL HIGH (ref 0.2–1.2)
Total Protein: 6.1 g/dL (ref 6.1–8.1)

## 2019-07-19 LAB — PROTIME-INR
INR: 1.2 — ABNORMAL HIGH
Prothrombin Time: 12 s — ABNORMAL HIGH (ref 9.0–11.5)

## 2019-07-19 LAB — CERULOPLASMIN: Ceruloplasmin: 36 mg/dL (ref 18–36)

## 2019-07-19 LAB — MITOCHONDRIAL ANTIBODIES: Mitochondrial M2 Ab, IgG: <=20 U

## 2019-07-19 LAB — ANA: Anti Nuclear Antibody (ANA): NEGATIVE

## 2019-07-19 LAB — FERRITIN: Ferritin: 2953 ng/mL — ABNORMAL HIGH (ref 38–380)

## 2019-07-19 LAB — ANTI-SMOOTH MUSCLE ANTIBODY, IGG: Actin (Smooth Muscle) Antibody (IGG): <20 U (ref <20)

## 2019-07-20 ENCOUNTER — Other Ambulatory Visit: Payer: Self-pay

## 2019-07-20 ENCOUNTER — Ambulatory Visit (HOSPITAL_BASED_OUTPATIENT_CLINIC_OR_DEPARTMENT_OTHER)
Admission: RE | Admit: 2019-07-20 | Discharge: 2019-07-20 | Disposition: A | Discharge status: Home / Self Care | Payer: 59 | Source: Home / Self Care | Attending: Internal Medicine | Admitting: Internal Medicine

## 2019-07-20 ENCOUNTER — Encounter (HOSPITAL_COMMUNITY): Payer: Self-pay | Admitting: Internal Medicine

## 2019-07-20 ENCOUNTER — Ambulatory Visit (HOSPITAL_COMMUNITY): Payer: 59 | Admitting: Anesthesiology

## 2019-07-20 ENCOUNTER — Encounter (HOSPITAL_COMMUNITY)
Admission: RE | Disposition: A | Discharge status: Home / Self Care | Payer: Self-pay | Source: Home / Self Care | Attending: Internal Medicine

## 2019-07-20 ENCOUNTER — Ambulatory Visit: Payer: 59 | Admitting: Family Medicine

## 2019-07-20 DIAGNOSIS — K760 Fatty (change of) liver, not elsewhere classified: Secondary | ICD-10-CM | POA: Insufficient documentation

## 2019-07-20 DIAGNOSIS — K712 Toxic liver disease with acute hepatitis: Secondary | ICD-10-CM | POA: Diagnosis not present

## 2019-07-20 DIAGNOSIS — K5730 Diverticulosis of large intestine without perforation or abscess without bleeding: Secondary | ICD-10-CM | POA: Insufficient documentation

## 2019-07-20 DIAGNOSIS — K759 Inflammatory liver disease, unspecified: Secondary | ICD-10-CM | POA: Diagnosis not present

## 2019-07-20 DIAGNOSIS — K6389 Other specified diseases of intestine: Secondary | ICD-10-CM | POA: Diagnosis not present

## 2019-07-20 DIAGNOSIS — D490 Neoplasm of unspecified behavior of digestive system: Secondary | ICD-10-CM | POA: Diagnosis not present

## 2019-07-20 DIAGNOSIS — D120 Benign neoplasm of cecum: Secondary | ICD-10-CM | POA: Diagnosis not present

## 2019-07-20 DIAGNOSIS — K635 Polyp of colon: Secondary | ICD-10-CM | POA: Diagnosis not present

## 2019-07-20 DIAGNOSIS — F17210 Nicotine dependence, cigarettes, uncomplicated: Secondary | ICD-10-CM | POA: Insufficient documentation

## 2019-07-20 DIAGNOSIS — D124 Benign neoplasm of descending colon: Secondary | ICD-10-CM | POA: Insufficient documentation

## 2019-07-20 DIAGNOSIS — R933 Abnormal findings on diagnostic imaging of other parts of digestive tract: Secondary | ICD-10-CM | POA: Insufficient documentation

## 2019-07-20 DIAGNOSIS — G4730 Sleep apnea, unspecified: Secondary | ICD-10-CM | POA: Insufficient documentation

## 2019-07-20 DIAGNOSIS — I10 Essential (primary) hypertension: Secondary | ICD-10-CM | POA: Insufficient documentation

## 2019-07-20 HISTORY — PX: COLONOSCOPY WITH PROPOFOL: SHX5780

## 2019-07-20 HISTORY — PX: POLYPECTOMY: SHX5525

## 2019-07-20 HISTORY — PX: BIOPSY: SHX5522

## 2019-07-20 LAB — IRON AND TIBC
Iron: 50 ug/dL (ref 45–182)
Saturation Ratios: 38 % (ref 17.9–39.5)
TIBC: 131 ug/dL — ABNORMAL LOW (ref 250–450)
UIBC: 81 ug/dL

## 2019-07-20 SURGERY — COLONOSCOPY WITH PROPOFOL
Anesthesia: General

## 2019-07-20 MED ORDER — PROPOFOL 10 MG/ML IV BOLUS
INTRAVENOUS | Status: DC | PRN
  Administered 2019-07-20: 20 mg via INTRAVENOUS

## 2019-07-20 MED ORDER — KETAMINE HCL 50 MG/5ML IJ SOSY
PREFILLED_SYRINGE | INTRAMUSCULAR | Status: AC
  Filled 2019-07-20: qty 5

## 2019-07-20 MED ORDER — CHLORHEXIDINE GLUCONATE CLOTH 2 % EX PADS: 6.0000 | MEDICATED_PAD | Freq: Once | CUTANEOUS | Status: DC

## 2019-07-20 MED ORDER — PROPOFOL 500 MG/50ML IV EMUL
INTRAVENOUS | Status: DC | PRN
  Administered 2019-07-20: 150 ug/kg/min via INTRAVENOUS

## 2019-07-20 MED ORDER — KETAMINE HCL 10 MG/ML IJ SOLN
INTRAMUSCULAR | Status: DC | PRN
  Administered 2019-07-20: 10 mg via INTRAVENOUS
  Administered 2019-07-20: 20 mg via INTRAVENOUS

## 2019-07-20 MED ORDER — LACTATED RINGERS IV SOLN: INTRAVENOUS | Status: DC | PRN

## 2019-07-20 MED ORDER — LACTATED RINGERS IV SOLN: Freq: Once | INTRAVENOUS | Status: AC

## 2019-07-20 MED ORDER — GLYCOPYRROLATE 0.2 MG/ML IJ SOLN
INTRAMUSCULAR | Status: DC | PRN
  Administered 2019-07-20: .1 mg via INTRAVENOUS

## 2019-07-20 MED ORDER — LIDOCAINE HCL (CARDIAC) PF 100 MG/5ML IV SOSY
PREFILLED_SYRINGE | INTRAVENOUS | Status: DC | PRN
  Administered 2019-07-20: 50 mg via INTRATRACHEAL

## 2019-07-20 NOTE — Discharge Instructions (Signed)
Colon Polyps  Polyps are tissue growths inside the body. Polyps can grow in many places, including the large intestine (colon). A polyp may be a round bump or a mushroom-shaped growth. You could have one polyp or several. Most colon polyps are noncancerous (benign). However, some colon polyps can become cancerous over time. Finding and removing the polyps early can help prevent this. What are the causes? The exact cause of colon polyps is not known. What increases the risk? You are more likely to develop this condition if you:  Have a family history of colon cancer or colon polyps.  Are older than 55 or older than 45 if you are African American.  Have inflammatory bowel disease, such as ulcerative colitis or Crohn's disease.  Have certain hereditary conditions, such as: ? Familial adenomatous polyposis. ? Lynch syndrome. ? Turcot syndrome. ? Peutz-Jeghers syndrome.  Are overweight.  Smoke cigarettes.  Do not get enough exercise.  Drink too much alcohol.  Eat a diet that is high in fat and red meat and low in fiber.  Had childhood cancer that was treated with abdominal radiation. What are the signs or symptoms? Most polyps do not cause symptoms. If you have symptoms, they may include:  Blood coming from your rectum when having a bowel movement.  Blood in your stool. The stool may look dark red or black.  Abdominal pain.  A change in bowel habits, such as constipation or diarrhea. How is this diagnosed? This condition is diagnosed with a colonoscopy. This is a procedure in which a lighted, flexible scope is inserted into the anus and then passed into the colon to examine the area. Polyps are sometimes found when a colonoscopy is done as part of routine cancer screening tests. How is this treated? Treatment for this condition involves removing any polyps that are found. Most polyps can be removed during a colonoscopy. Those polyps will then be tested for cancer. Additional  treatment may be needed depending on the results of testing. Follow these instructions at home: Lifestyle  Maintain a healthy weight, or lose weight if recommended by your health care provider.  Exercise every day or as told by your health care provider.  Do not use any products that contain nicotine or tobacco, such as cigarettes and e-cigarettes. If you need help quitting, ask your health care provider.  If you drink alcohol, limit how much you have: ? 0-1 drink a day for women. ? 0-2 drinks a day for men.  Be aware of how much alcohol is in your drink. In the [place] one drink equals one 12 oz bottle of beer (355 mL), one 5 oz glass of wine (148 mL), or one 1 oz shot of hard liquor (44 mL). Eating and drinking   Eat foods that are high in fiber, such as fruits, vegetables, and whole grains.  Eat foods that are high in calcium and vitamin D, such as milk, cheese, yogurt, eggs, liver, fish, and broccoli.  Limit foods that are high in fat, such as fried foods and desserts.  Limit the amount of red meat and processed meat you eat, such as hot dogs, sausage, bacon, and lunch meats. General instructions  Keep all follow-up visits as told by your health care provider. This is important. ? This includes having regularly scheduled colonoscopies. ? Talk to your health care provider about when you need a colonoscopy. Contact a health care provider if:  You have new or worsening bleeding during a bowel movement.  You  have new or increased blood in your stool.  You have a change in bowel habits.  You lose weight for no known reason. Summary  Polyps are tissue growths inside the body. Polyps can grow in many places, including the colon.  Most colon polyps are noncancerous (benign), but some can become cancerous over time.  This condition is diagnosed with a colonoscopy.  Treatment for this condition involves removing any polyps that are found. Most polyps can be removed during a  colonoscopy. This information is not intended to replace advice given to you by your health care provider. Make sure you discuss any questions you have with your health care provider. Document Revised: 07/11/2017 Document Reviewed: 07/11/2017 Elsevier Patient Education  [place] These instructions provide you with information about caring for yourself after your procedure. Your health care provider may also give you more specific instructions. Your treatment has been planned according to current medical practices, but problems sometimes occur. Call your health care provider if you have any problems or questions after your procedure. What can I expect after the procedure? After your procedure, you may:  Feel sleepy for several hours.  Feel clumsy and have poor balance for several hours.  Feel forgetful about what happened after the procedure.  Have poor judgment for several hours.  Feel nauseous or vomit.  Have a sore throat if you had a breathing tube during the procedure. Follow these instructions at home: For at least 24 hours after the procedure:      Have a responsible adult stay with you. It is important to have someone help care for you until you are awake and alert.  Rest as needed.  Do not: ? Participate in activities in which you could fall or become injured. ? Drive. ? Use heavy machinery. ? Drink alcohol. ? Take sleeping pills or medicines that cause drowsiness. ? Make important decisions or sign legal documents. ? Take care of children on your own. Eating and drinking  Follow the diet that is recommended by your health care provider.  If you vomit, drink water, juice, or soup when you can drink without vomiting.  Make sure you have little or no nausea before eating solid foods. General instructions  Take over-the-counter and prescription medicines only as told by your health care provider.  If you have  sleep apnea, surgery and certain medicines can increase your risk for breathing problems. Follow instructions from your health care provider about wearing your sleep device: ? Anytime you are sleeping, including during daytime naps. ? While taking prescription pain medicines, sleeping medicines, or medicines that make you drowsy.  If you smoke, do not smoke without supervision.  Keep all follow-up visits as told by your health care provider. This is important. Contact a health care provider if:  You keep feeling nauseous or you keep vomiting.  You feel light-headed.  You develop a rash.  You have a fever. Get help right away if:  You have trouble breathing. Summary  For several hours after your procedure, you may feel sleepy and have poor judgment.  Have a responsible adult stay with you for at least 24 hours or until you are awake and alert. This information is not intended to replace advice given to you by your health care provider. Make sure you discuss any questions you have with your health care provider. Document Revised: 06/24/2017 Document Reviewed: 07/17/2015 Elsevier Patient Education  [place].  Colonoscopy Discharge Instructions  Read the instructions outlined below and refer to this sheet in the next few weeks. These discharge instructions provide you with general information on caring for yourself after you leave the hospital. Your doctor may also give you specific instructions. While your treatment has been planned according to the most current medical practices available, unavoidable complications occasionally occur. If you have any problems or questions after discharge, call Dr. Gala Romney at [phone]. ACTIVITY  You may resume your regular activity, but move at a slower pace for the next 24 hours.   Take frequent rest periods for the next 24 hours.   Walking will help get rid of the air and reduce the bloated feeling in your belly (abdomen).   No  driving for 24 hours (because of the medicine (anesthesia) used during the test).    Do not sign any important legal documents or operate any machinery for 24 hours (because of the anesthesia used during the test).  NUTRITION  Drink plenty of fluids.   You may resume your normal diet as instructed by your doctor.   Begin with a light meal and progress to your normal diet. Heavy or fried foods are harder to digest and may make you feel sick to your stomach (nauseated).   Avoid alcoholic beverages for 24 hours or as instructed.  MEDICATIONS  You may resume your normal medications unless your doctor tells you otherwise.  WHAT YOU CAN EXPECT TODAY  Some feelings of bloating in the abdomen.   Passage of more gas than usual.   Spotting of blood in your stool or on the toilet paper.  IF YOU HAD POLYPS REMOVED DURING THE COLONOSCOPY:  No aspirin products for 7 days or as instructed.   No alcohol for 7 days or as instructed.   Eat a soft diet for the next 24 hours.  FINDING OUT THE RESULTS OF YOUR TEST Not all test results are available during your visit. If your test results are not back during the visit, make an appointment with your caregiver to find out the results. Do not assume everything is normal if you have not heard from your caregiver or the medical facility. It is important for you to follow up on all of your test results.  SEEK IMMEDIATE MEDICAL ATTENTION IF:  You have more than a spotting of blood in your stool.   Your belly is swollen (abdominal distention).   You are nauseated or vomiting.   You have a temperature over 101.   You have abdominal pain or discomfort that is severe or gets worse throughout the day.   Colon diverticulosis and polyp information provided  You have an abnormal ileocecal valve-this corresponds to the area of abnormality on the CAT scan.  It was biopsied.  Cause not clearly known at this time.  Biopsy will help [place] figure it out.  You  also had a small polyp which was removed and diverticulosis  Alcohol consumption has harmed your liver.  I am glad you have stopped drinking.  Your iron level appears to be very high in your bloodstream based on recent labs.  I am doing additional iron studies today.  We will be back in touch with you in the near future.  At patient request I called Jacey Pelc at [phone] rolled to voicemail.

## 2019-07-20 NOTE — Op Note (Signed)
[facility] Patient Name: Christopher Carrillo Procedure Date: 07/20/2019 10:01 AM MRN: [id number] Date of Birth: 1970-12-23 Attending MD: Norvel Richards , MD CSN: [id number] Age: 49 Admit Type: Outpatient Procedure:                Colonoscopy Indications:              Abnormal CT of the GI tract Providers:                Norvel Richards, MD, Janeece Riggers, RN, Nelma Rothman, Technician, Aram Candela Referring MD:              Medicines:                Propofol per Anesthesia Complications:            No immediate complications. Estimated Blood Loss:     Estimated blood loss: Minimal. Procedure:                Pre-Anesthesia Assessment:                           - Prior to the procedure, a History and Physical                            was performed, and patient medications and                            allergies were reviewed. The patient's tolerance of                            previous anesthesia was also reviewed. The risks                            and benefits of the procedure and the sedation                            options and risks were discussed with the patient.                            All questions were answered, and informed consent                            was obtained. Prior Anticoagulants: The patient has                            taken no previous anticoagulant or antiplatelet                            agents. ASA Grade Assessment: II - A patient with                            mild systemic disease. After reviewing the risks  and benefits, the patient was deemed in                            satisfactory condition to undergo the procedure.                           After obtaining informed consent, the colonoscope                            was passed under direct vision. Throughout the                            procedure, the patient's blood pressure, pulse, and   oxygen saturations were monitored continuously. The                            CF-[id number] ([id number]) scope was introduced through                            the anus and advanced to the 5 cm into the ileum.                            The colonoscopy was performed without difficulty.                            The patient tolerated the procedure well. The                            quality of the bowel preparation was adequate. Scope In: 10:29:04 AM Scope Out: 10:48:49 AM Scope Withdrawal Time: 0 hours 17 minutes 6 seconds  Total Procedure Duration: 0 hours 19 minutes 45 seconds  Findings:      The perianal and digital rectal examinations were normal.      Scattered small-mouthed diverticula were found in the entire colon.      A 5 mm polyp was found in the descending colon. The polyp was sessile.       The polyp was removed with a cold snare. Resection and retrieval were       complete.      Polypoid mass arising out of the distal side of the ileocecal valve.       Please see multiple photographs. It was soft to biopsy forcep palpation.       With peristalsis, the lesion attached to the ileocecal valve ball-valved       into the distal ileum and became nearly completely obscured as       peristalsis ensued.      With intubating the distal 5 cm of terminal ileum I was able to bring it       back out. The distal ileum appeared grossly normal. Surface of the       involved ileocecal valve appeared somewhat knobby and cobblestoned.       Multiple biopsies taken with a jumbo biopsy forceps. This was done       without apparent complication. Minimal bleeding. Impression:               - Diverticulosis in the entire examined colon.                           -  One 5 mm polyp in the descending colon, removed                            with a cold snare. Resected and retrieved. Abnormal                            ileocecal valve with apparent mass as described.                            Status  post biopsy..                           Patient also noted to be mildly jaundiced. Recent                            hepatic profile consistent with alcoholic                            hepatitis. Patient states he stopped drinking 2                            weeks ago. Of note, his ferritin is nearly 3000.                            Further iron studies not done. Moderate Sedation:      Moderate (conscious) sedation was personally administered by an       anesthesia professional. The following parameters were monitored: oxygen       saturation, heart rate, blood pressure, respiratory rate, EKG, adequacy       of pulmonary ventilation, and response to care. Recommendation:           - Patient has a contact number available for                            emergencies. The signs and symptoms of potential                            delayed complications were discussed with the                            patient. Return to normal activities tomorrow.                            Written discharge instructions were provided to the                            patient.                           - Advance diet as tolerated. Follow-up on                            pathology. Obtain a fasting IBC and iron today.  Further recommendations to follow in the near                            future. Procedure Code(s):        --- Professional ---                           [id number], Colonoscopy, flexible; with removal of                            tumor(s), polyp(s), or other lesion(s) by snare                            technique Diagnosis Code(s):        --- Professional ---                           K63.5, Polyp of colon                           K57.30, Diverticulosis of large intestine without                            perforation or abscess without bleeding                           R93.3, Abnormal findings on diagnostic imaging of                            other parts of  digestive tract CPT copyright 2019 [employer]. All rights reserved. The codes documented in this report are preliminary and upon coder review may  be revised to meet current compliance requirements. Cristopher Estimable. Cailynn Bodnar, MD Norvel Richards, MD 07/20/2019 11:04:48 AM This report has been signed electronically. Number of Addenda: 0

## 2019-07-20 NOTE — Anesthesia Postprocedure Evaluation (Signed)
Anesthesia Post Note  Patient: Christopher Carrillo  Procedure(s) Performed: COLONOSCOPY WITH PROPOFOL (N/A ) BIOPSY POLYPECTOMY  Patient location during evaluation: PACU Anesthesia Type: General Level of consciousness: awake and alert Pain management: pain level controlled Vital Signs Assessment: post-procedure vital signs reviewed and stable Respiratory status: spontaneous breathing Cardiovascular status: stable Postop Assessment: no apparent nausea or vomiting Anesthetic complications: no     Last Vitals:  Vitals:   07/20/19 0925 07/20/19 0930  BP: 120/82 113/76  Pulse:    Resp: 20 11  Temp:    SpO2: 99% 99%    Last Pain:  Vitals:   07/20/19 0912  TempSrc: Oral  PainSc: 0-No pain                 Kili Gracy Hristova

## 2019-07-20 NOTE — Anesthesia Preprocedure Evaluation (Signed)
Anesthesia Evaluation  Patient identified by MRN, date of birth, ID band Patient awake    Reviewed: NPO status , Patient's Chart, lab work & pertinent test results  History of Anesthesia Complications Negative for: history of anesthetic complications  Airway Mallampati: III  TM Distance: >3 FB Neck ROM: Full    Dental  (+) Teeth Intact, Dental Advisory Given   Pulmonary sleep apnea and Continuous Positive Airway Pressure Ventilation , Current Smoker and Patient abstained from smoking.,    Pulmonary exam normal breath sounds clear to auscultation       Cardiovascular Exercise Tolerance: Good hypertension, Pt. on medications  Rhythm:Regular Rate:Normal     Neuro/Psych negative neurological ROS     GI/Hepatic GERD  Medicated and Controlled,(+)     substance abuse  alcohol use,   Endo/Other  negative endocrine ROS  Renal/GU negative Renal ROS     Musculoskeletal   Abdominal   Peds  Hematology negative hematology ROS (+)   Anesthesia Other Findings   Reproductive/Obstetrics                             Anesthesia Physical Anesthesia Plan  ASA: II  Anesthesia Plan: General   Post-op Pain Management:    Induction: Intravenous  PONV Risk Score and Plan: TIVA and Treatment may vary due to age or medical condition  Airway Management Planned: Nasal Cannula and Natural Airway  Additional Equipment:   Intra-op Plan:   Post-operative Plan:   Informed Consent: I have reviewed the patients History and Physical, chart, labs and discussed the procedure including the risks, benefits and alternatives for the proposed anesthesia with the patient or authorized representative who has indicated his/her understanding and acceptance.     Dental advisory given  Plan Discussed with:   Anesthesia Plan Comments:         Anesthesia Quick Evaluation

## 2019-07-20 NOTE — Transfer of Care (Signed)
Immediate Anesthesia Transfer of Care Note  Patient: Christopher Carrillo  Procedure(s) Performed: COLONOSCOPY WITH PROPOFOL (N/A ) BIOPSY POLYPECTOMY  Patient Location: PACU  Anesthesia Type:General  Level of Consciousness: awake  Airway & Oxygen Therapy: Patient Spontanous Breathing  Post-op Assessment: Report given to RN and Post -op Vital signs reviewed and stable  Post vital signs: Reviewed and stable  Last Vitals:  Vitals Value Taken Time  BP    Temp    Pulse 93 07/20/19 1054  Resp 15 07/20/19 1054  SpO2 99 % 07/20/19 1054  Vitals shown include unvalidated device data.  Last Pain:  Vitals:   07/20/19 0912  TempSrc: Oral  PainSc: 0-No pain      Patients Stated Pain Goal: 7 (05/09/41 8887)  Complications: No apparent anesthesia complications

## 2019-07-20 NOTE — Interval H&P Note (Signed)
History and Physical Interval Note:  07/20/2019 10:13 AM  Christopher Carrillo  has presented today for surgery, with the diagnosis of colon mass.  The various methods of treatment have been discussed with the patient and family. After consideration of risks, benefits and other options for treatment, the patient has consented to  Procedure(s) with comments: COLONOSCOPY WITH PROPOFOL (N/A) - 10:15am as a surgical intervention.  The patient's history has been reviewed, patient examined, no change in status, stable for surgery.  I have reviewed the patient's chart and labs.  Questions were answered to the patient's satisfaction.     Toula Miyasaki  No change.  Patient does have a sallow appearance.  Says no alcohol in 2 weeks. Colonoscopy today per plan.  Also ,further recommendations to follow.

## 2019-07-20 NOTE — Progress Notes (Signed)
Christopher Carrillo cannot drive, operate machinery or sign legal documents until after April 13th, 2021 at 11:30am.  He can return to work after 11:30 on April 13th, 2021.

## 2019-07-21 ENCOUNTER — Telehealth: Payer: Self-pay | Admitting: Family Medicine

## 2019-07-21 ENCOUNTER — Inpatient Hospital Stay (HOSPITAL_COMMUNITY)
Admission: EM | Admit: 2019-07-21 | Discharge: 2019-07-26 | DRG: 442 | Disposition: A | Discharge status: Home / Self Care | Payer: 59 | Source: Ambulatory Visit | Attending: Family Medicine | Admitting: Family Medicine

## 2019-07-21 ENCOUNTER — Encounter: Payer: Self-pay | Admitting: Family Medicine

## 2019-07-21 ENCOUNTER — Emergency Department (HOSPITAL_COMMUNITY): Payer: 59

## 2019-07-21 ENCOUNTER — Encounter (HOSPITAL_COMMUNITY): Payer: Self-pay

## 2019-07-21 ENCOUNTER — Ambulatory Visit: Payer: 59 | Admitting: Family Medicine

## 2019-07-21 ENCOUNTER — Other Ambulatory Visit: Payer: Self-pay

## 2019-07-21 VITALS — BP 124/62 | HR 88 | Temp 98.1°F | Resp 16 | Ht 69.0 in | Wt 179.0 lb

## 2019-07-21 DIAGNOSIS — Z825 Family history of asthma and other chronic lower respiratory diseases: Secondary | ICD-10-CM

## 2019-07-21 DIAGNOSIS — R634 Abnormal weight loss: Secondary | ICD-10-CM | POA: Diagnosis present

## 2019-07-21 DIAGNOSIS — T50905A Adverse effect of unspecified drugs, medicaments and biological substances, initial encounter: Secondary | ICD-10-CM | POA: Diagnosis not present

## 2019-07-21 DIAGNOSIS — Z6826 Body mass index (BMI) 26.0-26.9, adult: Secondary | ICD-10-CM

## 2019-07-21 DIAGNOSIS — K529 Noninfective gastroenteritis and colitis, unspecified: Secondary | ICD-10-CM | POA: Diagnosis present

## 2019-07-21 DIAGNOSIS — D539 Nutritional anemia, unspecified: Secondary | ICD-10-CM | POA: Diagnosis not present

## 2019-07-21 DIAGNOSIS — R69 Illness, unspecified: Secondary | ICD-10-CM | POA: Diagnosis not present

## 2019-07-21 DIAGNOSIS — R188 Other ascites: Secondary | ICD-10-CM | POA: Diagnosis not present

## 2019-07-21 DIAGNOSIS — I10 Essential (primary) hypertension: Secondary | ICD-10-CM | POA: Diagnosis not present

## 2019-07-21 DIAGNOSIS — Z808 Family history of malignant neoplasm of other organs or systems: Secondary | ICD-10-CM

## 2019-07-21 DIAGNOSIS — E871 Hypo-osmolality and hyponatremia: Secondary | ICD-10-CM | POA: Diagnosis present

## 2019-07-21 DIAGNOSIS — B179 Acute viral hepatitis, unspecified: Secondary | ICD-10-CM | POA: Diagnosis not present

## 2019-07-21 DIAGNOSIS — R748 Abnormal levels of other serum enzymes: Secondary | ICD-10-CM | POA: Diagnosis present

## 2019-07-21 DIAGNOSIS — D638 Anemia in other chronic diseases classified elsewhere: Secondary | ICD-10-CM | POA: Diagnosis present

## 2019-07-21 DIAGNOSIS — K712 Toxic liver disease with acute hepatitis: Principal | ICD-10-CM | POA: Diagnosis present

## 2019-07-21 DIAGNOSIS — K760 Fatty (change of) liver, not elsewhere classified: Secondary | ICD-10-CM | POA: Diagnosis not present

## 2019-07-21 DIAGNOSIS — K759 Inflammatory liver disease, unspecified: Secondary | ICD-10-CM | POA: Diagnosis present

## 2019-07-21 DIAGNOSIS — F1021 Alcohol dependence, in remission: Secondary | ICD-10-CM | POA: Diagnosis present

## 2019-07-21 DIAGNOSIS — K5730 Diverticulosis of large intestine without perforation or abscess without bleeding: Secondary | ICD-10-CM | POA: Diagnosis present

## 2019-07-21 DIAGNOSIS — R339 Retention of urine, unspecified: Secondary | ICD-10-CM | POA: Diagnosis not present

## 2019-07-21 DIAGNOSIS — N139 Obstructive and reflux uropathy, unspecified: Secondary | ICD-10-CM | POA: Diagnosis present

## 2019-07-21 DIAGNOSIS — E876 Hypokalemia: Secondary | ICD-10-CM | POA: Diagnosis present

## 2019-07-21 DIAGNOSIS — K716 Toxic liver disease with hepatitis, not elsewhere classified: Secondary | ICD-10-CM | POA: Diagnosis present

## 2019-07-21 DIAGNOSIS — G4730 Sleep apnea, unspecified: Secondary | ICD-10-CM | POA: Diagnosis present

## 2019-07-21 DIAGNOSIS — R17 Unspecified jaundice: Secondary | ICD-10-CM | POA: Diagnosis not present

## 2019-07-21 DIAGNOSIS — F17210 Nicotine dependence, cigarettes, uncomplicated: Secondary | ICD-10-CM | POA: Diagnosis present

## 2019-07-21 DIAGNOSIS — R1011 Right upper quadrant pain: Secondary | ICD-10-CM

## 2019-07-21 DIAGNOSIS — N401 Enlarged prostate with lower urinary tract symptoms: Secondary | ICD-10-CM

## 2019-07-21 DIAGNOSIS — T41295A Adverse effect of other general anesthetics, initial encounter: Secondary | ICD-10-CM | POA: Diagnosis present

## 2019-07-21 DIAGNOSIS — D120 Benign neoplasm of cecum: Secondary | ICD-10-CM | POA: Diagnosis present

## 2019-07-21 DIAGNOSIS — K6389 Other specified diseases of intestine: Secondary | ICD-10-CM | POA: Diagnosis not present

## 2019-07-21 DIAGNOSIS — Z79899 Other long term (current) drug therapy: Secondary | ICD-10-CM

## 2019-07-21 DIAGNOSIS — R6510 Systemic inflammatory response syndrome (SIRS) of non-infectious origin without acute organ dysfunction: Secondary | ICD-10-CM | POA: Diagnosis present

## 2019-07-21 DIAGNOSIS — K7010 Alcoholic hepatitis without ascites: Secondary | ICD-10-CM | POA: Diagnosis not present

## 2019-07-21 DIAGNOSIS — N138 Other obstructive and reflux uropathy: Secondary | ICD-10-CM

## 2019-07-21 DIAGNOSIS — K219 Gastro-esophageal reflux disease without esophagitis: Secondary | ICD-10-CM | POA: Diagnosis present

## 2019-07-21 DIAGNOSIS — H04129 Dry eye syndrome of unspecified lacrimal gland: Secondary | ICD-10-CM | POA: Diagnosis present

## 2019-07-21 LAB — PROTIME-INR
INR: 1.1 (ref 0.8–1.2)
Prothrombin Time: 14.5 seconds (ref 11.4–15.2)

## 2019-07-21 LAB — COMPREHENSIVE METABOLIC PANEL
ALT: 43 U/L (ref 0–44)
AST: 109 U/L — ABNORMAL HIGH (ref 15–41)
Albumin: 2.4 g/dL — ABNORMAL LOW (ref 3.5–5.0)
Alkaline Phosphatase: 322 U/L — ABNORMAL HIGH (ref 38–126)
Anion gap: 13 (ref 5–15)
BUN: 5 mg/dL — ABNORMAL LOW (ref 6–20)
CO2: 23 mmol/L (ref 22–32)
Calcium: 8.3 mg/dL — ABNORMAL LOW (ref 8.9–10.3)
Chloride: 93 mmol/L — ABNORMAL LOW (ref 98–111)
Creatinine, Ser: 0.37 mg/dL — ABNORMAL LOW (ref 0.61–1.24)
GFR calc Af Amer: >60 mL/min (ref >60)
GFR calc non Af Amer: >60 mL/min (ref >60)
Glucose, Bld: 148 mg/dL — ABNORMAL HIGH (ref 70–99)
Potassium: 2.9 mmol/L — ABNORMAL LOW (ref 3.5–5.1)
Sodium: 129 mmol/L — ABNORMAL LOW (ref 135–145)
Total Bilirubin: 9.2 mg/dL — ABNORMAL HIGH (ref 0.3–1.2)
Total Protein: 5.7 g/dL — ABNORMAL LOW (ref 6.5–8.1)

## 2019-07-21 LAB — CBC WITH DIFFERENTIAL/PLATELET
Abs Immature Granulocytes: 0.13 10*3/uL — ABNORMAL HIGH (ref 0.00–0.07)
Basophils Absolute: 0.1 10*3/uL (ref 0.0–0.1)
Basophils Relative: 1 %
Eosinophils Absolute: 0 10*3/uL (ref 0.0–0.5)
Eosinophils Relative: 0 %
HCT: 35.1 % — ABNORMAL LOW (ref 39.0–52.0)
Hemoglobin: 12.4 g/dL — ABNORMAL LOW (ref 13.0–17.0)
Immature Granulocytes: 2 %
Lymphocytes Relative: 14 %
Lymphs Abs: 1.2 10*3/uL (ref 0.7–4.0)
MCH: 37.2 pg — ABNORMAL HIGH (ref 26.0–34.0)
MCHC: 35.3 g/dL (ref 30.0–36.0)
MCV: 105.4 fL — ABNORMAL HIGH (ref 80.0–100.0)
Monocytes Absolute: 2 10*3/uL — ABNORMAL HIGH (ref 0.1–1.0)
Monocytes Relative: 23 %
Neutro Abs: 5.4 10*3/uL (ref 1.7–7.7)
Neutrophils Relative %: 60 %
Platelets: 329 10*3/uL (ref 150–400)
RBC: 3.33 MIL/uL — ABNORMAL LOW (ref 4.22–5.81)
RDW: 16.9 % — ABNORMAL HIGH (ref 11.5–15.5)
WBC: 8.8 10*3/uL (ref 4.0–10.5)
nRBC: 0 % (ref 0.0–0.2)

## 2019-07-21 LAB — URINALYSIS, ROUTINE W REFLEX MICROSCOPIC
Glucose, UA: NEGATIVE mg/dL
Hgb urine dipstick: NEGATIVE
Ketones, ur: 15 mg/dL — AB
Nitrite: POSITIVE — AB
Protein, ur: 30 mg/dL — AB
Specific Gravity, Urine: 1.025 (ref 1.005–1.030)
pH: 6.5 (ref 5.0–8.0)

## 2019-07-21 LAB — URINALYSIS, MICROSCOPIC (REFLEX): RBC / HPF: NONE SEEN RBC/hpf (ref 0–5)

## 2019-07-21 LAB — LACTIC ACID, PLASMA: Lactic Acid, Venous: 2.6 mmol/L (ref 0.5–1.9)

## 2019-07-21 LAB — APTT: aPTT: 41 seconds — ABNORMAL HIGH (ref 24–36)

## 2019-07-21 LAB — MAGNESIUM: Magnesium: 1.7 mg/dL (ref 1.7–2.4)

## 2019-07-21 LAB — LIPASE, BLOOD: Lipase: 61 U/L — ABNORMAL HIGH (ref 11–51)

## 2019-07-21 MED ORDER — ACETAMINOPHEN 650 MG RE SUPP: 650.0000 mg | Freq: Four times a day (QID) | RECTAL | Status: DC | PRN

## 2019-07-21 MED ORDER — ACETAMINOPHEN 325 MG PO TABS: 650.0000 mg | ORAL_TABLET | Freq: Four times a day (QID) | ORAL | Status: DC | PRN

## 2019-07-21 MED ORDER — POLYETHYLENE GLYCOL 3350 17 G PO PACK: 17.0000 g | PACK | Freq: Every day | ORAL | Status: DC | PRN

## 2019-07-21 MED ORDER — POTASSIUM CHLORIDE IN NACL 40-0.9 MEQ/L-% IV SOLN
INTRAVENOUS | Status: DC
  Filled 2019-07-21 (×2): qty 1000

## 2019-07-21 MED ORDER — ADULT MULTIVITAMIN W/MINERALS CH
1.0000 | ORAL_TABLET | Freq: Every day | ORAL | Status: DC
  Administered 2019-07-22 – 2019-07-26 (×5): 1 via ORAL
  Filled 2019-07-21 (×5): qty 1

## 2019-07-21 MED ORDER — ONDANSETRON HCL 4 MG/2ML IJ SOLN
4.0000 mg | Freq: Once | INTRAMUSCULAR | Status: AC
  Administered 2019-07-21: 4 mg via INTRAVENOUS
  Filled 2019-07-21: qty 2

## 2019-07-21 MED ORDER — POTASSIUM CHLORIDE CRYS ER 20 MEQ PO TBCR
40.0000 meq | EXTENDED_RELEASE_TABLET | Freq: Once | ORAL | Status: AC
  Administered 2019-07-21: 40 meq via ORAL
  Filled 2019-07-21: qty 2

## 2019-07-21 MED ORDER — THIAMINE HCL 100 MG PO TABS
100.0000 mg | ORAL_TABLET | Freq: Every day | ORAL | Status: DC
  Administered 2019-07-22 – 2019-07-26 (×5): 100 mg via ORAL
  Filled 2019-07-21 (×5): qty 1

## 2019-07-21 MED ORDER — ONDANSETRON HCL 4 MG PO TABS: 4.0000 mg | ORAL_TABLET | Freq: Four times a day (QID) | ORAL | Status: DC | PRN

## 2019-07-21 MED ORDER — IOHEXOL 300 MG/ML  SOLN
100.0000 mL | Freq: Once | INTRAMUSCULAR | Status: AC | PRN
  Administered 2019-07-21: 100 mL via INTRAVENOUS

## 2019-07-21 MED ORDER — TRAMADOL HCL 50 MG PO TABS
50.0000 mg | ORAL_TABLET | Freq: Two times a day (BID) | ORAL | Status: AC | PRN
  Administered 2019-07-21: 50 mg via ORAL
  Filled 2019-07-21: qty 1

## 2019-07-21 MED ORDER — PIPERACILLIN-TAZOBACTAM 3.375 G IVPB 30 MIN
3.3750 g | Freq: Once | INTRAVENOUS | Status: AC
  Administered 2019-07-21: 3.375 g via INTRAVENOUS
  Filled 2019-07-21: qty 50

## 2019-07-21 MED ORDER — FOLIC ACID 1 MG PO TABS
1.0000 mg | ORAL_TABLET | Freq: Every day | ORAL | Status: DC
  Administered 2019-07-22 – 2019-07-26 (×5): 1 mg via ORAL
  Filled 2019-07-21 (×5): qty 1

## 2019-07-21 MED ORDER — POTASSIUM CHLORIDE IN NACL 40-0.9 MEQ/L-% IV SOLN
INTRAVENOUS | Status: DC
  Administered 2019-07-21: 100 mL/h via INTRAVENOUS

## 2019-07-21 MED ORDER — MORPHINE SULFATE (PF) 2 MG/ML IV SOLN
2.0000 mg | Freq: Once | INTRAVENOUS | Status: AC
  Administered 2019-07-21: 2 mg via INTRAVENOUS
  Filled 2019-07-21: qty 1

## 2019-07-21 MED ORDER — POTASSIUM CHLORIDE CRYS ER 20 MEQ PO TBCR
20.0000 meq | EXTENDED_RELEASE_TABLET | Freq: Once | ORAL | Status: AC
  Administered 2019-07-21: 20 meq via ORAL
  Filled 2019-07-21: qty 1

## 2019-07-21 MED ORDER — SODIUM CHLORIDE 0.9 % IV BOLUS
500.0000 mL | Freq: Once | INTRAVENOUS | Status: AC
  Administered 2019-07-22: 500 mL via INTRAVENOUS

## 2019-07-21 MED ORDER — SODIUM CHLORIDE 0.9 % IV BOLUS
1000.0000 mL | Freq: Once | INTRAVENOUS | Status: AC
  Administered 2019-07-21: 1000 mL via INTRAVENOUS

## 2019-07-21 MED ORDER — ONDANSETRON HCL 4 MG/2ML IJ SOLN: 4.0000 mg | Freq: Four times a day (QID) | INTRAMUSCULAR | Status: DC | PRN

## 2019-07-21 NOTE — ED Notes (Signed)
Bladder scan showed 38m urine.

## 2019-07-21 NOTE — Patient Instructions (Signed)
GO to ER this evening You need repeat scans done, labs, may need fluid removed from belly

## 2019-07-21 NOTE — ED Triage Notes (Signed)
Pt presents to ED, sent by Dr Buelah Manis for elevated liver enzymes and jaundice. Pt c/o abdomen being swollen and eyes yellow. Pt states urine has been dark regardless of how much water he drinks.

## 2019-07-21 NOTE — H&P (Addendum)
History and Physical    Christopher Carrillo DUK:[id number] DOB: [birth year] DOA: 07/21/2019  PCP: Alycia Rossetti, MD   Patient coming from: home  I have personally briefly reviewed patient's old medical records in [place]  Chief Complaint: Jaundice, abdominal distention  HPI: Christopher Carrillo is a 49 y.o. male with medical history significant for colon mass, depression, hypertension, psoriasis, alcohol use. Presented to the ED with complaints of yellowing of skin that was noticed a few days ago, this morning it became quite obvious and so patient presented to the ED.  He also reports abdominal swelling.  Reports some mild abdominal pain no right side of his abdomen present when he sits up and takes a deep breath, otherwise he is without pain.  Also reports dark urine despite drinking lots of water. He had colon prep and hence multiple loose stools and then colonoscopy yesterday for CT findings of colon mass 3/31.  Otherwise he denies vomiting or loose stools.   ED Course: Temp  100.8, heart rate 113, WBC 8.8.  Sodium 129.  Potassium 2.9.  ALP 322, total bilirubin 9.2.  UA positive nitrite trace leukocytes few bacteria.  Right upper quadrant ultrasound-fatty liver with hepatomegaly. EDP, talked to GI, Dr. Oneida Alar, recommended CT abdomen and pelvis which showed severe fatty liver infiltration.  Edema in the right colon, possibly infectious or ischemic colitis. Blood and urine cultures obtained, patient was started on IV Zosyn.  Review of Systems: As per HPI all other systems reviewed and negative.  Past Medical History:  Diagnosis Date  . Psoriasis   . Transaminitis     Past Surgical History:  Procedure Laterality Date  . NO PAST SURGERIES       reports that he has been smoking cigarettes. He has been smoking about 0.33 packs per day. He has never used smokeless tobacco. He reports previous alcohol use. He reports that he does not use drugs.  No Known Allergies  Family  History  Problem Relation Age of Onset  . Asthma Father   . Cancer Sister 18       Pharyngeal  . Colon cancer Neg Hx   . Liver disease Neg Hx     Prior to Admission medications   Not on File    Physical Exam: Vitals:   07/21/19 1421 07/21/19 1422 07/21/19 1821  BP: 123/83  123/71  Pulse: (!) 113  (!) 101  Resp: 18  20  Temp: 99.1 F (37.3 C)  (!) 100.8 F (38.2 C)  TempSrc: Oral    SpO2: 97%  97%  Weight:  81.2 kg   Height:  5' 9"  (1.753 m)     Constitutional: NAD, calm, comfortable Vitals:   07/21/19 1421 07/21/19 1422 07/21/19 1821  BP: 123/83  123/71  Pulse: (!) 113  (!) 101  Resp: 18  20  Temp: 99.1 F (37.3 C)  (!) 100.8 F (38.2 C)  TempSrc: Oral    SpO2: 97%  97%  Weight:  81.2 kg   Height:  5' 9"  (1.753 m)    Eyes: PERRL, mildly icteric conjunctiva ENMT: Mucous membranes are moist.   Neck: normal, supple, no masses, no thyromegaly Respiratory: Normal respiratory effort. No accessory muscle use.  Cardiovascular: Regular rate and rhythm, . No extremity edema. 2+ pedal pulses. Abdomen: no tenderness, soft, full, no masses palpated. No hepatosplenomegaly.   Musculoskeletal: no clubbing / cyanosis. No joint deformity upper and lower extremities. Good ROM, no contractures. Normal muscle tone.  Skin: Mildly icteric,  no rashes, lesions, ulcers. No induration Neurologic: No apparent cranial nerve abnormality, moving all extremities spontaneously. Psychiatric: Normal judgment and insight. Alert and oriented x 3. Normal mood.   Labs on Admission: I have personally reviewed following labs and imaging studies  CBC: Recent Labs  Lab 07/21/19 1457  WBC 8.8  NEUTROABS 5.4  HGB 12.4*  HCT 35.1*  MCV 105.4*  PLT 474   Basic Metabolic Panel: Recent Labs  Lab 07/21/19 1457  NA 129*  K 2.9*  CL 93*  CO2 23  GLUCOSE 148*  BUN 5*  CREATININE 0.37*  CALCIUM 8.3*   Liver Function Tests: Recent Labs  Lab 07/21/19 1457  AST 109*  ALT 43  ALKPHOS 322*    BILITOT 9.2*  PROT 5.7*  ALBUMIN 2.4*   Recent Labs  Lab 07/21/19 1457  LIPASE 61*   Coagulation Profile: Recent Labs  Lab 07/21/19 1457  INR 1.1   Anemia Panel: Recent Labs    07/20/19 1100  TIBC 131*  IRON 50   Urine analysis:    Component Value Date/Time   COLORURINE AMBER (A) 07/21/2019 1525   APPEARANCEUR HAZY (A) 07/21/2019 1525   LABSPEC 1.025 07/21/2019 1525   PHURINE 6.5 07/21/2019 1525   GLUCOSEU NEGATIVE 07/21/2019 1525   HGBUR NEGATIVE 07/21/2019 1525   BILIRUBINUR LARGE (A) 07/21/2019 1525   KETONESUR 15 (A) 07/21/2019 1525   PROTEINUR 30 (A) 07/21/2019 1525   NITRITE POSITIVE (A) 07/21/2019 1525   LEUKOCYTESUR TRACE (A) 07/21/2019 1525    Radiological Exams on Admission: US Abdomen Complete  Result Date: 07/21/2019 CLINICAL DATA:  Abdominal distension in jaundice EXAM: ABDOMEN ULTRASOUND COMPLETE COMPARISON:  07/08/2019 FINDINGS: Gallbladder: No gallstones or wall thickening visualized. No sonographic Murphy sign noted by sonographer. Common bile duct: Diameter: 2.2 mm. Liver: Hepatomegaly is noted. Increased echogenicity with heterogeneity is seen consistent with fatty infiltration noted on prior CT examination. Portal vein is patent on color Doppler imaging with normal direction of blood flow towards the liver. IVC: No abnormality visualized. Pancreas: Visualized portion unremarkable. Spleen: Size and appearance within normal limits. Right Kidney: Length: 12.4 cm. Echogenicity within normal limits. No mass or hydronephrosis visualized. Left Kidney: Length: 11.1 cm. 1 cm small cyst is noted in the lower pole of the left kidney. No mass lesion or hydronephrosis is noted. Abdominal aorta: No aneurysm visualized. Other findings: None. IMPRESSION: Fatty liver with hepatomegaly.  No mass lesion is seen. Small left renal cyst Electronically Signed   By: Inez Catalina M.D.   On: 07/21/2019 17:58   CT ABDOMEN PELVIS W CONTRAST  Result Date: 07/21/2019 CLINICAL  DATA:  Elevated LFT and jaundice. Rule out common duct stone EXAM: CT ABDOMEN AND PELVIS WITH CONTRAST TECHNIQUE: Multidetector CT imaging of the abdomen and pelvis was performed using the standard protocol following bolus administration of intravenous contrast. CONTRAST:  13m OMNIPAQUE IOHEXOL 300 MG/ML  SOLN COMPARISON:  Ultrasound abdomen 07/21/2019. CT abdomen pelvis 07/08/2019 FINDINGS: Lower chest: Lung bases clear bilaterally. Hepatobiliary: Marked fatty infiltration liver which is diffusely low density and enlarged. There are scattered patchy areas of fatty sparing in the liver. Gallbladder and bile ducts normal. No biliary dilatation. Pancreas: Negative for mass or edema. Spleen: Negative Adrenals/Urinary Tract: Adrenal glands are unremarkable. Kidneys are normal, without renal calculi, focal lesion, or hydronephrosis. Bladder is unremarkable. Stomach/Bowel: Negative for bowel obstruction. Diverticulosis in the cecum. There is mucosal edema throughout most of the right colon. This appears slightly improved from the prior study and may be  due to colitis. Although there are diverticula in the cecum, the edema extends throughout the right colon therefore infectious colitis seems more likely than diverticulitis. Bowel ischemia also a consideration. Normal appendix. Vascular/Lymphatic: Mild atherosclerotic disease. No aneurysm. Negative for lymphadenopathy. Reproductive: Prostate not enlarged. Other: Small amount of free fluid in the pelvis. Musculoskeletal: No acute skeletal abnormality. IMPRESSION: 1. Severe fatty infiltration liver.  No biliary dilatation. 2. Edema in the right colon. Possible infectious or ischemic colitis. Review of the prior CT reveals edema in the cecum with possible intussusception of the ileum into the colon. This appears to have resolved in the interval. Electronically Signed   By: Franchot Gallo M.D.   On: 07/21/2019 20:24    EKG: None.   Assessment/Plan Active Problems:    Elevated liver enzymes  Elevated liver enzymes- obstructive pattern, with ALP 322, total bilirubin 9.2, now febrile.  Right upper quadrant ultrasound and abdominal CT with contrast negative for biliary duct obstruction. -Continue IV antibiotics Zosyn -GI consulted in ED recommended abdominal CT, Dr. Laural Golden available tomorrow. -CMP a.m. -Clear liquid diet, n.p.o. midnight  SIRs/Sepsis-likely abdominal source, CT showing edema in the right colon, possible infectious or ischemic colitis.  Patient just had colonoscopy yesterday and bowel prep, ?  If this is related to his CT findings.  Abdominal distention, and then had diarrhea for colon prep but otherwise no GI symptoms. -Continue empiric Zosyn coverage -Follow-up blood and urine cultures. - Obtain Lactic acid >> 2.6 , Give 1.5 L bolus, trend lactic acid. - IVF N/s + 40 KCL 100cc/hr x 20 hrs  Hypokalemia-2.9, likely related to recent colon prep.  Reports good p.o. intake, not on meds. -Replete potassium -Check magnesium -BMP a.m.  Colon mass-per CT 3/31, cecal mass at the level of ileocecal valve concerning for neoplasm.  Subsequent colonoscopy yesterday, by dr. Gala Romney, - impression- Diverticulosis in the entire colon. One 5 mm polyp in the descending colon, removed with a cold snare. Resected and retrieved. Abnormal ileocecal valve with apparent mass. -Pathology results pending.  Psoriasis-not on medication.  Hypertension-stable.  Depression-stable.  Not on medication.  Alcohol use-reports he has not drunk any alcohol in at least 2 weeks. -Thiamine, folate multivitamins.   DVT prophylaxis: SCDs. Code Status: Full code Family Communication: Spouse at bedside Disposition Plan: ~ 2 days Consults called: Gastroenterology Admission status: Inpatient, telemetry I certify that at the point of admission it is my clinical judgment that the patient will require inpatient hospital care spanning beyond 2 midnights from the point of admission  due to high intensity of service, high risk for further deterioration and high frequency of surveillance required. The following factors support the patient status of inpatient: Elevated liver enzymes and possible sepsis, needs IV antibiotics and possibly MRCP/ERCP.   Bethena Roys MD [facility]  07/21/2019, 9:31 PM

## 2019-07-21 NOTE — Progress Notes (Signed)
Subjective:    Patient ID: Christopher Carrillo, male    DOB: 1970/08/12, 49 y.o.   MRN: [id number]  Patient presents for Hospital F/U (GI Issues)   Pt here  Is also worried about abnormal labs when he was routinely at emergency room he is noted that his abdomen is more distended and his hives are yellow.  This is worsening.  He was in the emergency room a few weeks ago secondary to right upper quadrant pain as well as not urinating very much.  He been drinking lots of fluids but still only having 1-2 urine today.  He was also having loose bowel movements as well as some nausea and vomiting intermittently.  He does have known underlying hepatic steatosis with elevated liver function test.  He was also previously drinking alcohol which he did he stopped after a last visit which was on March 10.  In the emergency room he was evaluated below are the pertinen  CT abdomen pelvis IMPRESSION: 1. 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 Colonoscopy is recommended for further evaluation. 2. Diffuse hepatic steatosis without focal abnormality.  UA showed bilirubin along with some blood  Potassium was  2.9  Na 131  Cr 0.59  AST 241/ ALT 87  - viral hepatitis panel negative   CBC 7.04/24/41/164  Neurology was consulted for the cecal mass and he was recommended to have colonoscopy performed.  He was given potassium via IV in the ER.  He was evaluated by gastroenterology on 4 6 and had colonoscopy performed yesterday.  He had a polyp removed as well as biopsy of the cecal mass but states that no one told him anything about his liver and what he was retaining fluid around his abdomen  Today he states that he has only urinated 3 times in the past couple of days despite drinking fluids.  His abdomen is becoming more distended and his eyes are yellow.  His stools are orange-like color but he is not sure that is due to the colonoscopy prep is hard to say were black and he was  Hemoccult positive He feels weak and not himself .  He still has nausea but is not had any recent emesis   Review Of Systems:  GEN- denies fatigue, fever, weight loss,weakness, recent illness HEENT- denies eye drainage, change in vision, nasal discharge, CVS- denies chest pain, palpitations RESP- denies SOB, cough, wheeze ABD- denies N/V, change in stools, abd pain GU- denies dysuria, hematuria, dribbling, incontinence MSK- denies joint pain, muscle aches, injury Neuro- denies headache, dizziness, syncope, seizure activity       Objective:    BP 124/62   Pulse 88   Temp 98.1 F (36.7 C) (Temporal)   Resp 16   Ht 5' 9"  (1.753 m)   Wt 179 lb (81.2 kg)   SpO2 98%   BMI 26.43 kg/m  GEN- NAD, alert and oriented x3 HEENT- PERRL, EOMI, sclera icteric , pink conjunctiva, MMM, oropharynx clear Neck- Supple, no thyromegaly CVS- RRR, no murmur RESP-CTAB ABD-NABS,soft,distended fluid wave palpated  Neuro-CNII-XII in tact no focal deficits , no asterisix EXT- No edema Pulses- Radial 2+        Assessment & Plan:      Problem List Items Addressed This Visit    None    Visit Diagnoses    Acute hepatitis    -  Primary   Acute hepaitis with scleral icterus and what appears to be progressive ascities and  urinary retention. Recommend he go back to ER to be evaluated, needs repeat  Labs and imaging  He may need TAP on abdominal fluid Pt voiced understanding States his wife gets off at 4pm , he is going to wait for her to take him Pathology of course pending from colonoscopy and cecal mass    Other ascites       Scleral icterus       Urinary retention    - needs bladder evaluated- likely cath      Note: This dictation was prepared with Dragon dictation along with smaller phrase technology. Any transcriptional errors that result from this process are unintentional.

## 2019-07-21 NOTE — ED Provider Notes (Signed)
Care assumed from Bernice, please see her note for full details, but in brief Christopher Carrillo is a 49 y.o. male with a history of GERD, elevated liver enzymes, heavy alcohol use until recently, who was recently diagnosed with a cecal mass, presents today due to jaundice, abdominal distention and increased thirst.  He has some mild right upper quadrant discomfort, has reported despite frequent fluid intake he has had decreased urinary output.  Reports fullness and bloating in his abdomen.  He was recently seen in the ED on March 31 and had an acute hepatitis panel completed which was negative.  Sent from PCP today.  Evaluation today significant for significant rise in bilirubin, today it is 9.2 alk phos is also elevated but patient has minimal elevation in AST, and ALT is normal.  Normal renal function.  Fortunately he has normal INR and platelets, so I have lower suspicion for fulminant liver failure.  Patient will require admission, but right upper quadrant ultrasound is still pending.  Plan: f/u RUQ [place], plan for admission for Jaundice with bili 9.2  BP 123/83 (BP Location: Right Arm)   Pulse (!) 113   Temp 99.1 F (37.3 C) (Oral)   Resp 18   Ht _0  (1.753 m)   Wt 81.2 kg   SpO2 97%   BMI 26.43 kg/m   ED Course/Procedures   Labs Reviewed  CBC WITH DIFFERENTIAL/PLATELET - Abnormal; Notable for the following components:      Result Value   RBC 3.33 (*)    Hemoglobin 12.4 (*)    HCT 35.1 (*)    MCV 105.4 (*)    MCH 37.2 (*)    RDW 16.9 (*)    Monocytes Absolute 2.0 (*)    Abs Immature Granulocytes 0.13 (*)    All other components within normal limits  COMPREHENSIVE METABOLIC PANEL - Abnormal; Notable for the following components:   Sodium 129 (*)    Potassium 2.9 (*)    Chloride 93 (*)    Glucose, Bld 148 (*)    BUN 5 (*)    Creatinine, Ser 0.37 (*)    Calcium 8.3 (*)    Total Protein 5.7 (*)    Albumin 2.4 (*)    AST 109 (*)    Alkaline Phosphatase 322 (*)    Total  Bilirubin 9.2 (*)    All other components within normal limits  LIPASE, BLOOD - Abnormal; Notable for the following components:   Lipase 61 (*)    All other components within normal limits  URINALYSIS, ROUTINE W REFLEX MICROSCOPIC - Abnormal; Notable for the following components:   Color, Urine AMBER (*)    APPearance HAZY (*)    Bilirubin Urine LARGE (*)    Ketones, ur 15 (*)    Protein, ur 30 (*)    Nitrite POSITIVE (*)    Leukocytes,Ua TRACE (*)    All other components within normal limits  APTT - Abnormal; Notable for the following components:   aPTT 41 (*)    All other components within normal limits  URINALYSIS, MICROSCOPIC (REFLEX) - Abnormal; Notable for the following components:   Bacteria, UA FEW (*)    All other components within normal limits  URINE CULTURE  PROTIME-INR    US Abdomen Complete  Result Date: 07/21/2019 CLINICAL DATA:  Abdominal distension in jaundice EXAM: ABDOMEN ULTRASOUND COMPLETE COMPARISON:  07/08/2019 FINDINGS: Gallbladder: No gallstones or wall thickening visualized. No sonographic Murphy sign noted by sonographer. Common bile duct: Diameter:  2.2 mm. Liver: Hepatomegaly is noted. Increased echogenicity with heterogeneity is seen consistent with fatty infiltration noted on prior CT examination. Portal vein is patent on color Doppler imaging with normal direction of blood flow towards the liver. IVC: No abnormality visualized. Pancreas: Visualized portion unremarkable. Spleen: Size and appearance within normal limits. Right Kidney: Length: 12.4 cm. Echogenicity within normal limits. No mass or hydronephrosis visualized. Left Kidney: Length: 11.1 cm. 1 cm small cyst is noted in the lower pole of the left kidney. No mass lesion or hydronephrosis is noted. Abdominal aorta: No aneurysm visualized. Other findings: None. IMPRESSION: Fatty liver with hepatomegaly.  No mass lesion is seen. Small left renal cyst Electronically Signed   By: Inez Catalina M.D.   On:  07/21/2019 17:58    Procedures  MDM   Patient with acute jaundice.  Total bili of 9.2.  Right upper quadrant ultrasound pending shows fatty liver with hepatomegaly but no mass lesion is seen, common bile duct is 2.2 and there are no gallstones or wall thickening noted.  Will consult for admission for jaundice.  Case discussed with Dr. Denton Brick with [facility] hospitalist, who requests GI consultation prior to admission.  Patient has also developed a fever during his ED stay.  Urinalysis with some signs of infection, culture pending, but also with acute jaundice concern for potential biliary obstruction or cholangitis.  Will order dose of IV Zosyn and discussed with GI.  Case discussed with Dr. Oneida Alar with GI who recommends getting CT scan to better see if there is a stone within the common bile duct.  Feels that patient can remain here in the emergency department as Dr. Laural Golden will be on-call tomorrow and can intervene on CBD stone if necessary.  Discussed these recommendations with Dr. Feliciana Rossetti with [facility] hospitalist who will see and admit the patient.       Jacqlyn Larsen, PA-C 07/22/19 [id number]    Milton Ferguson, MD 07/22/19 1031

## 2019-07-21 NOTE — ED Provider Notes (Signed)
[facility] EMERGENCY DEPARTMENT Provider Note   CSN: [id number] Arrival date & time: 07/21/19  1409     History Chief Complaint  Patient presents with  . Abnormal Lab    Christopher Carrillo is a 49 y.o. male with a history as outlined below including GERD, history of elevated liver enzymes, heavy EtOH use (states 4-5 shots daily) until recently, stating he stopped drinking about 2 weeks ago and who is diagnosed with a cecal mass per CT imaging here on March 31 for which he underwent colonoscopy and biopsy just yesterday (result pending).  He was seen by his PCP today who sent him here due to obvious jaundice, abdominal distention and increased thirst although reports has had little urine output despite drinking lots of fluids.  He does endorse some mild right upper quadrant abdominal pain with palpation and has difficulty taking a deep breath secondary to this pain.  He mostly describes feeling very full in his abdomen like he "ate too much".  He denies use of Tylenol.  He had an acute hepatitis panel completed at his visit here on March 31 which was negative for acute infection.  He endorses chronic dark urine production, has not noticed any changes in his bowels, also denies bloody stools, no unexplained bleeding or bruising.  Her reports a 30+ pound weight loss in recent months.  HPI     Past Medical History:  Diagnosis Date  . Psoriasis   . Transaminitis     Patient Active Problem List   Diagnosis Date Noted  . Elevated LFTs 07/14/2019  . Colonic mass 07/14/2019  . MDD (major depressive disorder) 05/25/2019  . Hepatic steatosis 02/18/2019  . GAD (generalized anxiety disorder) 02/18/2019  . Atypical chest pain 04/08/2013  . GERD (gastroesophageal reflux disease) 04/08/2013  . Rectal leakage 04/08/2013  . Hyperlipidemia 12/19/2012  . Essential hypertension, benign 12/19/2012  . Routine general medical examination at a health care facility 08/07/2012  . Psoriasis 05/04/2012  .  Obese 05/04/2012  . Tobacco use 05/04/2012    Past Surgical History:  Procedure Laterality Date  . NO PAST SURGERIES         Family History  Problem Relation Age of Onset  . Asthma Father   . Cancer Sister 18       Pharyngeal  . Colon cancer Neg Hx   . Liver disease Neg Hx     Social History   Tobacco Use  . Smoking status: Current Every Day Smoker    Packs/day: 0.33    Types: Cigarettes    Last attempt to quit: 03/20/2016    Years since quitting: 3.3  . Smokeless tobacco: Never Used  Substance Use Topics  . Alcohol use: Not Currently    Comment: 3-4 beers/night  . Drug use: No    Home Medications Prior to Admission medications   Not on File    Allergies    Patient has no known allergies.  Review of Systems   Review of Systems  Constitutional: Positive for appetite change, fatigue and unexpected weight change. Negative for fever.  HENT: Negative for congestion and sore throat.   Eyes: Negative.   Respiratory: Negative for chest tightness and shortness of breath.   Cardiovascular: Negative for chest pain.  Gastrointestinal: Positive for abdominal distention. Negative for abdominal pain, blood in stool, constipation, diarrhea, nausea and vomiting.  Endocrine: Positive for polydipsia. Negative for polyuria.  Genitourinary: Positive for decreased urine volume. Negative for dysuria.  Musculoskeletal: Negative for arthralgias, joint swelling  and neck pain.  Skin: Positive for color change. Negative for rash and wound.  Neurological: Negative for dizziness, weakness, light-headedness, numbness and headaches.  Hematological: Does not bruise/bleed easily.  Psychiatric/Behavioral: Negative.     Physical Exam Updated Vital Signs BP 123/83 (BP Location: Right Arm)   Pulse (!) 113   Temp 99.1 F (37.3 C) (Oral)   Resp 18   Ht 5' 9"  (1.753 m)   Wt 81.2 kg   SpO2 97%   BMI 26.43 kg/m   Physical Exam Vitals and nursing note reviewed.  Constitutional:       General: He is not in acute distress.    Appearance: Normal appearance. He is well-developed.  HENT:     Head: Normocephalic and atraumatic.     Mouth/Throat:     Mouth: Mucous membranes are moist.  Eyes:     General: Scleral icterus present.     Conjunctiva/sclera: Conjunctivae normal.  Cardiovascular:     Rate and Rhythm: Regular rhythm. Tachycardia present.     Heart sounds: Normal heart sounds.  Pulmonary:     Effort: Pulmonary effort is normal.     Breath sounds: Normal breath sounds. No wheezing.  Abdominal:     General: Bowel sounds are normal. There is distension.     Palpations: Abdomen is soft. There is hepatomegaly and mass.     Tenderness: There is abdominal tenderness in the right upper quadrant and epigastric area. There is no guarding or rebound.     Comments: Palpable liver edge 4 cm below ribs, extending past epigastric midline. Tender.  Generalized abd distention without obvious fluid wave.   Musculoskeletal:        General: Normal range of motion.     Cervical back: Normal range of motion.  Skin:    General: Skin is warm and dry.     Coloration: Skin is jaundiced.     Findings: No bruising.  Neurological:     Mental Status: He is alert.     ED Results / Procedures / Treatments   Labs (all labs ordered are listed, but only abnormal results are displayed) Labs Reviewed  CBC WITH DIFFERENTIAL/PLATELET - Abnormal; Notable for the following components:      Result Value   RBC 3.33 (*)    Hemoglobin 12.4 (*)    HCT 35.1 (*)    MCV 105.4 (*)    MCH 37.2 (*)    RDW 16.9 (*)    Monocytes Absolute 2.0 (*)    Abs Immature Granulocytes 0.13 (*)    All other components within normal limits  COMPREHENSIVE METABOLIC PANEL - Abnormal; Notable for the following components:   Sodium 129 (*)    Potassium 2.9 (*)    Chloride 93 (*)    Glucose, Bld 148 (*)    BUN 5 (*)    Creatinine, Ser 0.37 (*)    Calcium 8.3 (*)    Total Protein 5.7 (*)    Albumin 2.4 (*)     AST 109 (*)    Alkaline Phosphatase 322 (*)    Total Bilirubin 9.2 (*)    All other components within normal limits  LIPASE, BLOOD - Abnormal; Notable for the following components:   Lipase 61 (*)    All other components within normal limits  URINALYSIS, ROUTINE W REFLEX MICROSCOPIC - Abnormal; Notable for the following components:   Color, Urine AMBER (*)    APPearance HAZY (*)    Bilirubin Urine LARGE (*)  Ketones, ur 15 (*)    Protein, ur 30 (*)    Nitrite POSITIVE (*)    Leukocytes,Ua TRACE (*)    All other components within normal limits  APTT - Abnormal; Notable for the following components:   aPTT 41 (*)    All other components within normal limits  URINALYSIS, MICROSCOPIC (REFLEX) - Abnormal; Notable for the following components:   Bacteria, UA FEW (*)    All other components within normal limits  URINE CULTURE  PROTIME-INR    EKG None  Radiology No results found.  Procedures Procedures (including critical care time)  Medications Ordered in ED Medications - No data to display  ED Course  I have reviewed the triage vital signs and the nursing notes.  Pertinent labs & imaging results that were available during my care of the patient were reviewed by me and considered in my medical decision making (see chart for details).    MDM Rules/Calculators/A&P                      Pt with exam and history suggesting acute hepatitis with increased abdominal distention, hepatomegaly and profoundly worsened bilirubin and minimally elevated  LFT's.  New cecal mass pending biopsy results.  Pt fairly comfortable here, at recheck, had complaint of new headache.  Given zofran, morphine per IV to avoid nsaids and apap. Pending abd [place].  Discussed with Benedetto Goad, PA-C who assumes care. Will plan admission once [place] completed. Final Clinical Impression(s) / ED Diagnoses Final diagnoses:  None    Rx / DC Orders ED Discharge Orders    None       Landis Martins 07/21/19  1656    Milton Ferguson, MD 07/22/19 1031

## 2019-07-22 DIAGNOSIS — R1011 Right upper quadrant pain: Secondary | ICD-10-CM

## 2019-07-22 DIAGNOSIS — R17 Unspecified jaundice: Secondary | ICD-10-CM

## 2019-07-22 DIAGNOSIS — R748 Abnormal levels of other serum enzymes: Secondary | ICD-10-CM

## 2019-07-22 DIAGNOSIS — K7010 Alcoholic hepatitis without ascites: Secondary | ICD-10-CM

## 2019-07-22 LAB — COMPREHENSIVE METABOLIC PANEL
ALT: 36 U/L (ref 0–44)
AST: 90 U/L — ABNORMAL HIGH (ref 15–41)
Albumin: 2.1 g/dL — ABNORMAL LOW (ref 3.5–5.0)
Alkaline Phosphatase: 256 U/L — ABNORMAL HIGH (ref 38–126)
Anion gap: 8 (ref 5–15)
BUN: <5 mg/dL — ABNORMAL LOW (ref 6–20)
CO2: 27 mmol/L (ref 22–32)
Calcium: 7.8 mg/dL — ABNORMAL LOW (ref 8.9–10.3)
Chloride: 98 mmol/L (ref 98–111)
Creatinine, Ser: 0.43 mg/dL — ABNORMAL LOW (ref 0.61–1.24)
GFR calc Af Amer: >60 mL/min (ref >60)
GFR calc non Af Amer: >60 mL/min (ref >60)
Glucose, Bld: 101 mg/dL — ABNORMAL HIGH (ref 70–99)
Potassium: 3.7 mmol/L (ref 3.5–5.1)
Sodium: 133 mmol/L — ABNORMAL LOW (ref 135–145)
Total Bilirubin: 10.3 mg/dL — ABNORMAL HIGH (ref 0.3–1.2)
Total Protein: 5.1 g/dL — ABNORMAL LOW (ref 6.5–8.1)

## 2019-07-22 LAB — BASIC METABOLIC PANEL
Anion gap: 10 (ref 5–15)
BUN: <5 mg/dL — ABNORMAL LOW (ref 6–20)
CO2: 24 mmol/L (ref 22–32)
Calcium: 7.7 mg/dL — ABNORMAL LOW (ref 8.9–10.3)
Chloride: 96 mmol/L — ABNORMAL LOW (ref 98–111)
Creatinine, Ser: 0.44 mg/dL — ABNORMAL LOW (ref 0.61–1.24)
GFR calc Af Amer: >60 mL/min (ref >60)
GFR calc non Af Amer: >60 mL/min (ref >60)
Glucose, Bld: 171 mg/dL — ABNORMAL HIGH (ref 70–99)
Potassium: 3.6 mmol/L (ref 3.5–5.1)
Sodium: 130 mmol/L — ABNORMAL LOW (ref 135–145)

## 2019-07-22 LAB — CBC
HCT: 30.3 % — ABNORMAL LOW (ref 39.0–52.0)
Hemoglobin: 10.9 g/dL — ABNORMAL LOW (ref 13.0–17.0)
MCH: 37.7 pg — ABNORMAL HIGH (ref 26.0–34.0)
MCHC: 36 g/dL (ref 30.0–36.0)
MCV: 104.8 fL — ABNORMAL HIGH (ref 80.0–100.0)
Platelets: 314 10*3/uL (ref 150–400)
RBC: 2.89 MIL/uL — ABNORMAL LOW (ref 4.22–5.81)
RDW: 16.9 % — ABNORMAL HIGH (ref 11.5–15.5)
WBC: 9.1 10*3/uL (ref 4.0–10.5)
nRBC: 0 % (ref 0.0–0.2)

## 2019-07-22 LAB — HEPATIC FUNCTION PANEL
ALT: 38 U/L (ref 0–44)
AST: 101 U/L — ABNORMAL HIGH (ref 15–41)
Albumin: 2.2 g/dL — ABNORMAL LOW (ref 3.5–5.0)
Alkaline Phosphatase: 267 U/L — ABNORMAL HIGH (ref 38–126)
Bilirubin, Direct: 7.3 mg/dL — ABNORMAL HIGH (ref 0.0–0.2)
Indirect Bilirubin: 4.1 mg/dL — ABNORMAL HIGH (ref 0.3–0.9)
Total Bilirubin: 11.4 mg/dL — ABNORMAL HIGH (ref 0.3–1.2)
Total Protein: 5.4 g/dL — ABNORMAL LOW (ref 6.5–8.1)

## 2019-07-22 LAB — BILIRUBIN, DIRECT: Bilirubin, Direct: 5.9 mg/dL — ABNORMAL HIGH (ref 0.0–0.2)

## 2019-07-22 LAB — COMPREHENSIVE METABOLIC PANEL WITH GFR
ALT: 36 U/L (ref 0–44)
AST: 89 U/L — ABNORMAL HIGH (ref 15–41)
Albumin: 2.1 g/dL — ABNORMAL LOW (ref 3.5–5.0)
Alkaline Phosphatase: 259 U/L — ABNORMAL HIGH (ref 38–126)
Anion gap: 8 (ref 5–15)
BUN: 5 mg/dL — ABNORMAL LOW (ref 6–20)
CO2: 26 mmol/L (ref 22–32)
Calcium: 7.5 mg/dL — ABNORMAL LOW (ref 8.9–10.3)
Chloride: 97 mmol/L — ABNORMAL LOW (ref 98–111)
Creatinine, Ser: 0.42 mg/dL — ABNORMAL LOW (ref 0.61–1.24)
GFR calc Af Amer: >60 mL/min
GFR calc non Af Amer: >60 mL/min
Glucose, Bld: 110 mg/dL — ABNORMAL HIGH (ref 70–99)
Potassium: 3.1 mmol/L — ABNORMAL LOW (ref 3.5–5.1)
Sodium: 131 mmol/L — ABNORMAL LOW (ref 135–145)
Total Bilirubin: 9.4 mg/dL — ABNORMAL HIGH (ref 0.3–1.2)
Total Protein: 5.1 g/dL — ABNORMAL LOW (ref 6.5–8.1)

## 2019-07-22 LAB — MAGNESIUM: Magnesium: 1.6 mg/dL — ABNORMAL LOW (ref 1.7–2.4)

## 2019-07-22 LAB — LACTIC ACID, PLASMA: Lactic Acid, Venous: 1.4 mmol/L (ref 0.5–1.9)

## 2019-07-22 LAB — SURGICAL PATHOLOGY

## 2019-07-22 LAB — HIV ANTIBODY (ROUTINE TESTING W REFLEX): HIV Screen 4th Generation wRfx: NONREACTIVE

## 2019-07-22 MED ORDER — HYOSCYAMINE SULFATE 0.125 MG SL SUBL
0.1250 mg | SUBLINGUAL_TABLET | Freq: Three times a day (TID) | SUBLINGUAL | Status: DC
  Administered 2019-07-22 – 2019-07-26 (×12): 0.125 mg via SUBLINGUAL
  Filled 2019-07-22 (×12): qty 1

## 2019-07-22 MED ORDER — NAPHAZOLINE-GLYCERIN 0.012-0.2 % OP SOLN
2.0000 [drp] | Freq: Four times a day (QID) | OPHTHALMIC | Status: DC | PRN
  Filled 2019-07-22: qty 15

## 2019-07-22 MED ORDER — MAGNESIUM SULFATE 4 GM/100ML IV SOLN
4.0000 g | Freq: Once | INTRAVENOUS | Status: AC
  Administered 2019-07-22: 4 g via INTRAVENOUS
  Filled 2019-07-22: qty 100

## 2019-07-22 MED ORDER — PIPERACILLIN-TAZOBACTAM 3.375 G IVPB
3.3750 g | Freq: Three times a day (TID) | INTRAVENOUS | Status: DC
  Administered 2019-07-22 – 2019-07-23 (×4): 3.375 g via INTRAVENOUS
  Filled 2019-07-22 (×4): qty 50

## 2019-07-22 MED ORDER — MORPHINE SULFATE (PF) 2 MG/ML IV SOLN
1.0000 mg | INTRAVENOUS | Status: DC | PRN
  Administered 2019-07-22 – 2019-07-23 (×5): 1 mg via INTRAVENOUS
  Filled 2019-07-22 (×5): qty 1

## 2019-07-22 MED ORDER — TRAMADOL HCL 50 MG PO TABS
50.0000 mg | ORAL_TABLET | Freq: Two times a day (BID) | ORAL | Status: AC | PRN
  Administered 2019-07-22: 50 mg via ORAL
  Filled 2019-07-22: qty 1

## 2019-07-22 MED ORDER — POTASSIUM CHLORIDE IN NACL 40-0.9 MEQ/L-% IV SOLN
INTRAVENOUS | Status: DC
  Administered 2019-07-22: 75 mL/h via INTRAVENOUS
  Administered 2019-07-23: 40 mL/h via INTRAVENOUS

## 2019-07-22 MED ORDER — ENSURE ENLIVE PO LIQD
237.0000 mL | Freq: Two times a day (BID) | ORAL | Status: DC
  Administered 2019-07-25: 09:00:00 237 mL via ORAL

## 2019-07-22 NOTE — Progress Notes (Signed)
CRITICAL VALUE ALERT  Critical Value:  Lactic Acid 2.6  Date & Time Notied:  07/21/19; 2300  Provider Notified: MD aware  Orders Received/Actions taken: New orders for Sodium Chloride 0.9% bolus 1,000 ml and Sodium Chloride 0.9% bolus 500 ml.

## 2019-07-22 NOTE — Telephone Encounter (Signed)
error 

## 2019-07-22 NOTE — Progress Notes (Signed)
Initial Nutrition Assessment  DOCUMENTATION CODES:   Not applicable  INTERVENTION:  Ensure Enlive po BID, each supplement provides 350 kcal and 20 grams of protein (strawberry)  Continue MVI with minerals daily  Recommend monitoring magnesium, potassium, and phosphorus daily for at least 3 days, MD to replete as needed, as pt is at risk for refeeding syndrome given hypokalemia, hypomagnesemia on admission.   NUTRITION DIAGNOSIS:   Moderate Malnutrition related to chronic illness(colon mass pending biopsy results from 3/31 colonoscopy) as evidenced by percent weight loss, mild fat depletion, mild muscle depletion, moderate muscle depletion.    GOAL:   Patient will meet greater than or equal to 90% of their needs    MONITOR:   Labs, I & O's, Supplement acceptance, PO intake, Weight trends  REASON FOR ASSESSMENT:   Malnutrition Screening Tool    ASSESSMENT:   49 year old male with past medical history significant of colon mass, depression, HTN, psoriasis and alcohol use presented with complaints of yellowing of skin, abdominal swelling with mild abdominal pain, and dark urine, admitted on 4/13 for elevated liver enzymes and jaundice.  Patient is s/p colonoscopy on 07/08/19 for ileocecal valve mass biopsy and resection of 5 mm polyp in descending colon.  Very pleasant patient awake and alert this afternoon. He endorses good appetite and intake at home over the past few weeks recalls 2 meals/day (bananas and grain bars for breakfast, and chicken, green beans, lima beans, kidney beans, garlic butter pasta, and salads for dinner) Patient reports that he does not have time for lunch, endorses drinking high protein Ensure supplement 1-2/day, usual intake of 1/2 gallon of H20/day and drinks Gaterade zero.   Current wt 178.64 lbs Per history, on 3/31 pt wt 179.52 lbs, on 06/17/19 pt wt 185.68 lbs, on 05/25/19 pt wt 191.62 lbs, on 02/18/19 pt wt 199.54 lbs. This indicates ~21 lb (10.5%)  wt loss over the past 5 months and ~13 lb (6.8%) wt loss in 2 months which is significant for time frame. Patient recalls UBW 200 lbs and endorses unintentional weight loss secondary to  ~ 4 month period of poor appetite/intake due to sudden onset of cold sweats, feeling nauseas, occasional episodes of vomiting and shaking. After multiple visits to PCP, he was treated for panic attacks. Patient symptoms persisted, recalls presenting to ED where he found to have low potassium and reports feeling better after replacement.   Patient stated that he has not consumed any alcohol in the past 3 weeks. He reports increased alcohol intake while living in an apartment secondary to being bored without having the regular chores of taking care of a home. He recalls having a beer and 4-5 shots nightly during that time.   Per notes: -US abdomen survey for ascites -GI consulted for concern biliary obstruction -IV antibiotics and supportive therapy for colitis -hypokalemia, hypomagnesemia being repleted  Medications reviewed and include: folic acid, MVI, thiamine IVF: NaCl with KCl 40 mEq/ L IVPB: Magnesium sulfate, Zosyn Labs: Na 133 (L), Mg 1.6 (L)  NUTRITION - FOCUSED PHYSICAL EXAM:    Most Recent Value  Orbital Region  Moderate depletion  Upper Arm Region  No depletion  Thoracic and Lumbar Region  No depletion  Buccal Region  Mild depletion  Temple Region  Mild depletion  Clavicle Bone Region  No depletion  Clavicle and Acromion Bone Region  No depletion  Scapular Bone Region  Unable to assess  Dorsal Hand  No depletion  Patellar Region  Moderate depletion  Anterior Thigh Region  No depletion  Posterior Calf Region  Moderate depletion  Edema (RD Assessment)  None  Hair  Reviewed  Eyes  Reviewed  Mouth  Reviewed  Skin  Reviewed [jaundice]  Nails  Reviewed       Diet Order:   Diet Order            Diet Heart Room service appropriate? Yes; Fluid consistency: Thin  Diet effective now               EDUCATION NEEDS:   Education needs have been addressed  Skin:  Skin Assessment: Reviewed RN Assessment  Last BM:  4/13  Height:   Ht Readings from Last 1 Encounters:  07/21/19 5' 9"  (1.753 m)    Weight:   Wt Readings from Last 1 Encounters:  07/21/19 81.2 kg    Ideal Body Weight:  72.7 kg  BMI:  Body mass index is 26.43 kg/m.  Estimated Nutritional Needs:   Kcal:  2200-2400  Protein:  110-120  Fluid:  >/= 2.2 L/day   Lajuan Lines, RD, LDN Clinical Nutrition After Hours/Weekend Pager # in [place]
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
## 2019-07-22 NOTE — Progress Notes (Signed)
PROGRESS NOTE [facility]   Christopher Carrillo  ZMO:[id number]  DOB: 1970/10/11  DOA: 07/21/2019 PCP: Alycia Rossetti, MD   Brief Admission Hx: 49 year old gentleman with history of a colon mass, hypertension, depression, psoriasis, alcohol use presented with jaundice and abdominal pain and distention.  He has status post colonoscopy 07/08/2019 Dr. Gala Romney where the mass at the ileocecal valve was biopsied.  He also had a 5 mm polyp in the descending colon that was resected.  MDM/Assessment & Plan:   1. Abdominal distention-patient is concerned about ascites, will ask for ultrasound abdomen survey ascites. 2. Bilirubinemia-concern that patient having biliary obstruction.  GI consult requested.  He may require MRCP versus ERCP.  Further recommendations to come. 3. Colitis-patient is being empirically treated with IV antibiotics and supportive therapy.  Lactate has resolved. 4. Hypokalemia-this is being repleted.  Check magnesium. 5. Hypomagnesemia-IV replacement ordered.  Follow levels. 6. Essential hypertension-blood pressure stable we will continue to follow. 7. Chronic alcoholism-patient reports no alcohol in the last 2 weeks.  He has been started on vitamin, folic acid and thiamine. 8. Anemia chronic disease-hemoglobin down from hemodilution with IV fluids, continue to follow.  DVT prophylaxis: SCDs Code Status: Full Family Communication: Updated at bedside during rounds Disposition Plan: GI consultation, continue supportive therapy continue IV antibiotics IV fluids and follow-up labs   Consultants:  GI  Procedures:  N/A  Antimicrobials:  Zosyn  Subjective: Patient reports he continues to have abdominal pain and requesting more pain medications  Objective: Vitals:   07/21/19 2222 07/22/19 0222 07/22/19 0553 07/22/19 0553  BP: 122/74 110/72 104/74   Pulse: (!) 106 98 85   Resp: 20 20 18    Temp: 100.2 F (37.9 C) 99.8 F (37.7 C)  99.2 F (37.3 C)  TempSrc:  Oral Oral  Oral  SpO2: 98% 99% 96%   Weight:      Height:        Intake/Output Summary (Last 24 hours) at 07/22/2019 1106 Last data filed at 07/22/2019 0400 Gross per 24 hour  Intake 604.8 ml  Output -  Net 604.8 ml   Filed Weights   07/21/19 1422  Weight: 81.2 kg     REVIEW OF SYSTEMS  As per history otherwise all reviewed and reported negative  Exam:  General exam: Awake, alert, no apparent distress cooperative. Respiratory system: Clear. No increased work of breathing. Cardiovascular system: S1 & S2 heard. No JVD, murmurs, gallops, clicks or pedal edema. Gastrointestinal system: Abdomen is mildly distended, soft and tender in the right lower quadrant and right upper quadrant, hepatomegaly normal bowel sounds heard. Central nervous system: Alert and oriented. No focal neurological deficits. Extremities: 1+ pretibial edema bilateral lower extremities.  Data Reviewed: Basic Metabolic Panel: Recent Labs  Lab 07/21/19 1457 07/22/19 0240 07/22/19 0853  NA 129* 131* 133*  K 2.9* 3.1* 3.7  CL 93* 97* 98  CO2 23 26 27   GLUCOSE 148* 110* 101*  BUN 5* 5* <5*  CREATININE 0.37* 0.42* 0.43*  CALCIUM 8.3* 7.5* 7.8*  MG 1.7  --  1.6*   Liver Function Tests: Recent Labs  Lab 07/21/19 1457 07/22/19 0240 07/22/19 0853  AST 109* 89* 90*  ALT 43 36 36  ALKPHOS 322* 259* 256*  BILITOT 9.2* 9.4* 10.3*  PROT 5.7* 5.1* 5.1*  ALBUMIN 2.4* 2.1* 2.1*   Recent Labs  Lab 07/21/19 1457  LIPASE 61*   No results for input(s): AMMONIA in the last 168 hours. CBC: Recent Labs  Lab 07/21/19  1457 07/22/19 0240  WBC 8.8 9.1  NEUTROABS 5.4  --   HGB 12.4* 10.9*  HCT 35.1* 30.3*  MCV 105.4* 104.8*  PLT 329 314   Cardiac Enzymes: No results for input(s): CKTOTAL, CKMB, CKMBINDEX, TROPONINI in the last 168 hours. CBG (last 3)  No results for input(s): GLUCAP in the last 72 hours. Recent Results (from the past 240 hour(s))  SARS CORONAVIRUS 2 (TAT 6-24 HRS) Nasopharyngeal  Nasopharyngeal Swab     Status: None   Collection Time: 07/17/19  7:41 AM   Specimen: Nasopharyngeal Swab  Result Value Ref Range Status   SARS Coronavirus 2 NEGATIVE NEGATIVE Final    Comment: (NOTE) SARS-CoV-2 target nucleic acids are NOT DETECTED. The SARS-CoV-2 RNA is generally detectable in upper and lower respiratory specimens during the acute phase of infection. Negative results do not preclude SARS-CoV-2 infection, do not rule out co-infections with other pathogens, and should not be used as the sole basis for treatment or other patient management decisions. Negative results must be combined with clinical observations, patient history, and epidemiological information. The expected result is Negative. Fact Sheet for Patients: SugarRoll.be Fact Sheet for Healthcare Providers: https://www.woods-mathews.com/ This test is not yet approved or cleared by the [place] FDA and  has been authorized for detection and/or diagnosis of SARS-CoV-2 by FDA under an Emergency Use Authorization (EUA). This EUA will remain  in effect (meaning this test can be used) for the duration of the COVID-19 declaration under Section 56 4(b)(1) of the Act, 21 U.S.C. section 360bbb-3(b)(1), unless the authorization is terminated or revoked sooner. Performed at [facility], [place] [address] [place], [place] [address]   Blood culture (routine x 2)     Status: None (Preliminary result)   Collection Time: 07/21/19  7:00 PM   Specimen: Right Antecubital; Blood  Result Value Ref Range Status   Specimen Description RIGHT ANTECUBITAL  Final   Special Requests   Final    BOTTLES DRAWN AEROBIC AND ANAEROBIC Blood Culture adequate volume   Culture   Final    NO GROWTH < 24 HOURS Performed at [facility], [address], [place], [place] [address]    Report Status PENDING  Incomplete  Blood culture (routine x 2)     Status: None (Preliminary result)    Collection Time: 07/21/19  7:00 PM   Specimen: Left Antecubital; Blood  Result Value Ref Range Status   Specimen Description LEFT ANTECUBITAL  Final   Special Requests   Final    BOTTLES DRAWN AEROBIC AND ANAEROBIC Blood Culture adequate volume   Culture   Final    NO GROWTH < 24 HOURS Performed at [facility], [address] [place],  [address]    Report Status PENDING  Incomplete     Studies: US Abdomen Complete  Result Date: 07/21/2019 CLINICAL DATA:  Abdominal distension in jaundice EXAM: ABDOMEN ULTRASOUND COMPLETE COMPARISON:  07/08/2019 FINDINGS: Gallbladder: No gallstones or wall thickening visualized. No sonographic Murphy sign noted by sonographer. Common bile duct: Diameter: 2.2 mm. Liver: Hepatomegaly is noted. Increased echogenicity with heterogeneity is seen consistent with fatty infiltration noted on prior CT examination. Portal vein is patent on color Doppler imaging with normal direction of blood flow towards the liver. IVC: No abnormality visualized. Pancreas: Visualized portion unremarkable. Spleen: Size and appearance within normal limits. Right Kidney: Length: 12.4 cm. Echogenicity within normal limits. No mass or hydronephrosis visualized. Left Kidney: Length: 11.1 cm. 1 cm small cyst is noted in the  lower pole of the left kidney. No mass lesion or hydronephrosis is noted. Abdominal aorta: No aneurysm visualized. Other findings: None. IMPRESSION: Fatty liver with hepatomegaly.  No mass lesion is seen. Small left renal cyst Electronically Signed   By: Inez Catalina M.D.   On: 07/21/2019 17:58   CT ABDOMEN PELVIS W CONTRAST  Result Date: 07/21/2019 CLINICAL DATA:  Elevated LFT and jaundice. Rule out common duct stone EXAM: CT ABDOMEN AND PELVIS WITH CONTRAST TECHNIQUE: Multidetector CT imaging of the abdomen and pelvis was performed using the standard protocol following bolus administration of intravenous contrast. CONTRAST:  181m OMNIPAQUE IOHEXOL 300 MG/ML  SOLN  COMPARISON:  Ultrasound abdomen 07/21/2019. CT abdomen pelvis 07/08/2019 FINDINGS: Lower chest: Lung bases clear bilaterally. Hepatobiliary: Marked fatty infiltration liver which is diffusely low density and enlarged. There are scattered patchy areas of fatty sparing in the liver. Gallbladder and bile ducts normal. No biliary dilatation. Pancreas: Negative for mass or edema. Spleen: Negative Adrenals/Urinary Tract: Adrenal glands are unremarkable. Kidneys are normal, without renal calculi, focal lesion, or hydronephrosis. Bladder is unremarkable. Stomach/Bowel: Negative for bowel obstruction. Diverticulosis in the cecum. There is mucosal edema throughout most of the right colon. This appears slightly improved from the prior study and may be due to colitis. Although there are diverticula in the cecum, the edema extends throughout the right colon therefore infectious colitis seems more likely than diverticulitis. Bowel ischemia also a consideration. Normal appendix. Vascular/Lymphatic: Mild atherosclerotic disease. No aneurysm. Negative for lymphadenopathy. Reproductive: Prostate not enlarged. Other: Small amount of free fluid in the pelvis. Musculoskeletal: No acute skeletal abnormality. IMPRESSION: 1. Severe fatty infiltration liver.  No biliary dilatation. 2. Edema in the right colon. Possible infectious or ischemic colitis. Review of the prior CT reveals edema in the cecum with possible intussusception of the ileum into the colon. This appears to have resolved in the interval. Electronically Signed   By: CFranchot GalloM.D.   On: 07/21/2019 20:24     Scheduled Meds: . folic acid  1 mg Oral Daily  . multivitamin with minerals  1 tablet Oral Daily  . thiamine  100 mg Oral Daily   Continuous Infusions: . 0.9 % NaCl with KCl 40 mEq / L 75 mL/hr (07/22/19 0925)  . piperacillin-tazobactam (ZOSYN)  IV 3.375 g (07/22/19 0308)    Active Problems:   Elevated liver enzymes   Time spent:   CIrwin Brakeman MD [facility] 07/22/2019, 11:06 AM    LOS: 1 day  How to contact the T[facility]ttending or Consulting provider [place] covering provider during after hours [place] for this patient?  1. Check the care team in [facility] look for a) attending/consulting TRH provider listed and b) the [facility] Surgery[facility] listed 2. Log into [email or web address] and use [place]'s universal password to access. If you do not have the password, please contact the hospital operator. 3. Locate the TR[facility] Hinsdal[facility]ider you are looking for under [facility] and page to a number that you can be directly reached. 4. If you still have difficulty reaching the provider, please page the [facility](Director on Call) for the Hospitalists listed on amion for assistance.

## 2019-07-23 DIAGNOSIS — R17 Unspecified jaundice: Secondary | ICD-10-CM | POA: Diagnosis present

## 2019-07-23 DIAGNOSIS — R748 Abnormal levels of other serum enzymes: Secondary | ICD-10-CM

## 2019-07-23 DIAGNOSIS — R1011 Right upper quadrant pain: Secondary | ICD-10-CM

## 2019-07-23 DIAGNOSIS — K7010 Alcoholic hepatitis without ascites: Secondary | ICD-10-CM

## 2019-07-23 LAB — CBC WITH DIFFERENTIAL/PLATELET
Abs Immature Granulocytes: 0.07 10*3/uL (ref 0.00–0.07)
Basophils Absolute: 0.1 10*3/uL (ref 0.0–0.1)
Basophils Relative: 1 %
Eosinophils Absolute: 0.1 10*3/uL (ref 0.0–0.5)
Eosinophils Relative: 1 %
HCT: 32.6 % — ABNORMAL LOW (ref 39.0–52.0)
Hemoglobin: 11.5 g/dL — ABNORMAL LOW (ref 13.0–17.0)
Immature Granulocytes: 1 %
Lymphocytes Relative: 21 %
Lymphs Abs: 1.7 10*3/uL (ref 0.7–4.0)
MCH: 37.6 pg — ABNORMAL HIGH (ref 26.0–34.0)
MCHC: 35.3 g/dL (ref 30.0–36.0)
MCV: 106.5 fL — ABNORMAL HIGH (ref 80.0–100.0)
Monocytes Absolute: 1.5 10*3/uL — ABNORMAL HIGH (ref 0.1–1.0)
Monocytes Relative: 19 %
Neutro Abs: 4.7 10*3/uL (ref 1.7–7.7)
Neutrophils Relative %: 57 %
Platelets: 351 10*3/uL (ref 150–400)
RBC: 3.06 MIL/uL — ABNORMAL LOW (ref 4.22–5.81)
RDW: 17.2 % — ABNORMAL HIGH (ref 11.5–15.5)
WBC: 8.1 10*3/uL (ref 4.0–10.5)
nRBC: 0 % (ref 0.0–0.2)

## 2019-07-23 LAB — COMPREHENSIVE METABOLIC PANEL
ALT: 34 U/L (ref 0–44)
AST: 90 U/L — ABNORMAL HIGH (ref 15–41)
Albumin: 2 g/dL — ABNORMAL LOW (ref 3.5–5.0)
Alkaline Phosphatase: 224 U/L — ABNORMAL HIGH (ref 38–126)
Anion gap: 8 (ref 5–15)
BUN: <5 mg/dL — ABNORMAL LOW (ref 6–20)
CO2: 25 mmol/L (ref 22–32)
Calcium: 7.7 mg/dL — ABNORMAL LOW (ref 8.9–10.3)
Chloride: 98 mmol/L (ref 98–111)
Creatinine, Ser: 0.37 mg/dL — ABNORMAL LOW (ref 0.61–1.24)
GFR calc Af Amer: >60 mL/min (ref >60)
GFR calc non Af Amer: >60 mL/min (ref >60)
Glucose, Bld: 92 mg/dL (ref 70–99)
Potassium: 3.4 mmol/L — ABNORMAL LOW (ref 3.5–5.1)
Sodium: 131 mmol/L — ABNORMAL LOW (ref 135–145)
Total Bilirubin: 10.3 mg/dL — ABNORMAL HIGH (ref 0.3–1.2)
Total Protein: 5 g/dL — ABNORMAL LOW (ref 6.5–8.1)

## 2019-07-23 LAB — URINE CULTURE: Culture: NO GROWTH

## 2019-07-23 LAB — PHOSPHORUS: Phosphorus: 2.4 mg/dL — ABNORMAL LOW (ref 2.5–4.6)

## 2019-07-23 LAB — PROTIME-INR
INR: 1.2 (ref 0.8–1.2)
Prothrombin Time: 14.9 seconds (ref 11.4–15.2)

## 2019-07-23 LAB — VITAMIN B12: Vitamin B-12: 477 pg/mL (ref 180–914)

## 2019-07-23 LAB — MAGNESIUM: Magnesium: 2 mg/dL (ref 1.7–2.4)

## 2019-07-23 MED ORDER — TRAMADOL HCL 50 MG PO TABS
50.0000 mg | ORAL_TABLET | Freq: Two times a day (BID) | ORAL | Status: AC | PRN
  Administered 2019-07-24: 50 mg via ORAL
  Filled 2019-07-23: qty 1

## 2019-07-23 MED ORDER — POTASSIUM CHLORIDE CRYS ER 20 MEQ PO TBCR
40.0000 meq | EXTENDED_RELEASE_TABLET | Freq: Once | ORAL | Status: AC
  Administered 2019-07-23: 40 meq via ORAL
  Filled 2019-07-23: qty 2

## 2019-07-23 MED ORDER — MORPHINE SULFATE (PF) 2 MG/ML IV SOLN
2.0000 mg | INTRAVENOUS | Status: DC | PRN
  Administered 2019-07-23 – 2019-07-26 (×10): 2 mg via INTRAVENOUS
  Filled 2019-07-23 (×10): qty 1

## 2019-07-23 MED ORDER — TAMSULOSIN HCL 0.4 MG PO CAPS
0.4000 mg | ORAL_CAPSULE | Freq: Every day | ORAL | Status: DC
  Administered 2019-07-23 – 2019-07-25 (×3): 0.4 mg via ORAL
  Filled 2019-07-23 (×3): qty 1

## 2019-07-23 NOTE — Progress Notes (Addendum)
PROGRESS NOTE [facility]   Oaklyn Mans  EPP:[id number]  DOB: 1971-02-24  DOA: 07/21/2019 PCP: Alycia Rossetti, MD   Brief Admission Hx: 49 year old gentleman with history of a colon mass, hypertension, depression, psoriasis, alcohol use presented with jaundice and abdominal pain and distention.  He has status post colonoscopy 07/08/2019 Dr. Gala Romney where the mass at the ileocecal valve was biopsied.  He also had a 5 mm polyp in the descending colon that was resected.  MDM/Assessment & Plan:   1. Abdominal pain - patient continues to require IV morphine for symptoms.  2. Alcohol induced hepatitis - bilirubin 10.3 today.  Continue supportive measures.  3. Colitis-DC zosyn.  Lactate has resolved. 4. Hypokalemia-this is being repleted.  Magnesium repleted. 5. Hypomagnesemia-repleted. 6. Essential hypertension-blood pressure stable we will continue to follow. 7. Chronic alcoholism-patient reports no alcohol in the last 2 weeks.  He has been started on vitamin, folic acid and thiamine. 8. Anemia chronic disease-hemoglobin stable 9. Urinary outlet obstruction - add flomax daily.   DVT prophylaxis: SCDs Code Status: Full Family Communication: wife updated at bedside during rounds Disposition Plan: GI following, still requiring IV pain meds for pain control, continue supportive therapy   Consultants:  GI  Procedures:  N/A   Subjective: Patient requesting IV pain meds for abdominal pain  Objective: Vitals:   07/22/19 1444 07/22/19 2005 07/22/19 2226 07/23/19 0550  BP: 126/87  113/76 106/70  Pulse: 93  95 88  Resp: 17  18 18   Temp: 98.7 F (37.1 C)  99.6 F (37.6 C) 98.1 F (36.7 C)  TempSrc: Oral  Oral Oral  SpO2: 99% 96% 97% 96%  Weight:      Height:        Intake/Output Summary (Last 24 hours) at 07/23/2019 1306 Last data filed at 07/23/2019 0600 Gross per 24 hour  Intake 1335.65 ml  Output 810 ml  Net 525.65 ml   Filed Weights   07/21/19 1422   Weight: 81.2 kg     REVIEW OF SYSTEMS  As per history otherwise all reviewed and reported negative  Exam:  General exam: Awake, alert, no apparent distress cooperative.  Jaundiced.  Respiratory system: Clear. No increased work of breathing. Cardiovascular system: S1 & S2 heard. No JVD, murmurs, gallops, clicks or pedal edema. Gastrointestinal system: Abdomen is mildly distended, soft and tender in the right lower quadrant and right upper quadrant, hepatomegaly normal bowel sounds heard. Central nervous system: Alert and oriented. No focal neurological deficits. Extremities: 1+ pretibial edema bilateral lower extremities.  Data Reviewed: Basic Metabolic Panel: Recent Labs  Lab 07/21/19 1457 07/22/19 0240 07/22/19 0853 07/22/19 1621 07/23/19 0526  NA 129* 131* 133* 130* 131*  K 2.9* 3.1* 3.7 3.6 3.4*  CL 93* 97* 98 96* 98  CO2 23 26 27 24 25   GLUCOSE 148* 110* 101* 171* 92  BUN 5* 5* <5* <5* <5*  CREATININE 0.37* 0.42* 0.43* 0.44* 0.37*  CALCIUM 8.3* 7.5* 7.8* 7.7* 7.7*  MG 1.7  --  1.6*  --  2.0  PHOS  --   --   --   --  2.4*   Liver Function Tests: Recent Labs  Lab 07/21/19 1457 07/22/19 0240 07/22/19 0853 07/22/19 1621 07/23/19 0526  AST 109* 89* 90* 101* 90*  ALT 43 36 36 38 34  ALKPHOS 322* 259* 256* 267* 224*  BILITOT 9.2* 9.4* 10.3* 11.4* 10.3*  PROT 5.7* 5.1* 5.1* 5.4* 5.0*  ALBUMIN 2.4* 2.1* 2.1* 2.2* 2.0*  Recent Labs  Lab 07/21/19 1457  LIPASE 61*   No results for input(s): AMMONIA in the last 168 hours. CBC: Recent Labs  Lab 07/21/19 1457 07/22/19 0240 07/23/19 0526  WBC 8.8 9.1 8.1  NEUTROABS 5.4  --  4.7  HGB 12.4* 10.9* 11.5*  HCT 35.1* 30.3* 32.6*  MCV 105.4* 104.8* 106.5*  PLT 329 314 351   Cardiac Enzymes: No results for input(s): CKTOTAL, CKMB, CKMBINDEX, TROPONINI in the last 168 hours. CBG (last 3)  No results for input(s): GLUCAP in the last 72 hours. Recent Results (from the past 240 hour(s))  SARS CORONAVIRUS 2 (TAT  6-24 HRS) Nasopharyngeal Nasopharyngeal Swab     Status: None   Collection Time: 07/17/19  7:41 AM   Specimen: Nasopharyngeal Swab  Result Value Ref Range Status   SARS Coronavirus 2 NEGATIVE NEGATIVE Final    Comment: (NOTE) SARS-CoV-2 target nucleic acids are NOT DETECTED. The SARS-CoV-2 RNA is generally detectable in upper and lower respiratory specimens during the acute phase of infection. Negative results do not preclude SARS-CoV-2 infection, do not rule out co-infections with other pathogens, and should not be used as the sole basis for treatment or other patient management decisions. Negative results must be combined with clinical observations, patient history, and epidemiological information. The expected result is Negative. Fact Sheet for Patients: SugarRoll.be Fact Sheet for Healthcare Providers: https://www.woods-mathews.com/ This test is not yet approved or cleared by the [place] FDA and  has been authorized for detection and/or diagnosis of SARS-CoV-2 by FDA under an Emergency Use Authorization (EUA). This EUA will remain  in effect (meaning this test can be used) for the duration of the COVID-19 declaration under Section 56 4(b)(1) of the Act, 21 U.S.C. section 360bbb-3(b)(1), unless the authorization is terminated or revoked sooner. Performed at [facility], [place] [address] [place], [place] [address]   Urine culture     Status: None   Collection Time: 07/21/19  3:25 PM   Specimen: Urine, Random  Result Value Ref Range Status   Specimen Description   Final    URINE, RANDOM Performed at [facility], [address], [place], [place] [address]    Special Requests   Final    NONE Performed at [facility], [address] [place], [place] [address]    Culture   Final    NO GROWTH Performed at [facility], [place] [address], [place], [place] [address]    Report Status 07/23/2019 FINAL  Final  Blood culture  (routine x 2)     Status: None (Preliminary result)   Collection Time: 07/21/19  7:00 PM   Specimen: Right Antecubital; Blood  Result Value Ref Range Status   Specimen Description RIGHT ANTECUBITAL  Final   Special Requests   Final    BOTTLES DRAWN AEROBIC AND ANAEROBIC Blood Culture adequate volume   Culture   Final    NO GROWTH < 24 HOURS Performed at [facility], [address], [place], [place] [address]    Report Status PENDING  Incomplete  Blood culture (routine x 2)     Status: None (Preliminary result)   Collection Time: 07/21/19  7:00 PM   Specimen: Left Antecubital; Blood  Result Value Ref Range Status   Specimen Description LEFT ANTECUBITAL  Final   Special Requests   Final    BOTTLES DRAWN AEROBIC AND ANAEROBIC Blood Culture adequate volume   Culture   Final    NO GROWTH < 24 HOURS Performed  at [facility], [address], [place], [place] [address]    Report Status PENDING  Incomplete     Studies: US Abdomen Complete  Result Date: 07/21/2019 CLINICAL DATA:  Abdominal distension in jaundice EXAM: ABDOMEN ULTRASOUND COMPLETE COMPARISON:  07/08/2019 FINDINGS: Gallbladder: No gallstones or wall thickening visualized. No sonographic Murphy sign noted by sonographer. Common bile duct: Diameter: 2.2 mm. Liver: Hepatomegaly is noted. Increased echogenicity with heterogeneity is seen consistent with fatty infiltration noted on prior CT examination. Portal vein is patent on color Doppler imaging with normal direction of blood flow towards the liver. IVC: No abnormality visualized. Pancreas: Visualized portion unremarkable. Spleen: Size and appearance within normal limits. Right Kidney: Length: 12.4 cm. Echogenicity within normal limits. No mass or hydronephrosis visualized. Left Kidney: Length: 11.1 cm. 1 cm small cyst is noted in the lower pole of the left kidney. No mass lesion or hydronephrosis is noted. Abdominal aorta: No aneurysm visualized. Other findings: None. IMPRESSION:  Fatty liver with hepatomegaly.  No mass lesion is seen. Small left renal cyst Electronically Signed   By: Inez Catalina M.D.   On: 07/21/2019 17:58   CT ABDOMEN PELVIS W CONTRAST  Result Date: 07/21/2019 CLINICAL DATA:  Elevated LFT and jaundice. Rule out common duct stone EXAM: CT ABDOMEN AND PELVIS WITH CONTRAST TECHNIQUE: Multidetector CT imaging of the abdomen and pelvis was performed using the standard protocol following bolus administration of intravenous contrast. CONTRAST:  134m OMNIPAQUE IOHEXOL 300 MG/ML  SOLN COMPARISON:  Ultrasound abdomen 07/21/2019. CT abdomen pelvis 07/08/2019 FINDINGS: Lower chest: Lung bases clear bilaterally. Hepatobiliary: Marked fatty infiltration liver which is diffusely low density and enlarged. There are scattered patchy areas of fatty sparing in the liver. Gallbladder and bile ducts normal. No biliary dilatation. Pancreas: Negative for mass or edema. Spleen: Negative Adrenals/Urinary Tract: Adrenal glands are unremarkable. Kidneys are normal, without renal calculi, focal lesion, or hydronephrosis. Bladder is unremarkable. Stomach/Bowel: Negative for bowel obstruction. Diverticulosis in the cecum. There is mucosal edema throughout most of the right colon. This appears slightly improved from the prior study and may be due to colitis. Although there are diverticula in the cecum, the edema extends throughout the right colon therefore infectious colitis seems more likely than diverticulitis. Bowel ischemia also a consideration. Normal appendix. Vascular/Lymphatic: Mild atherosclerotic disease. No aneurysm. Negative for lymphadenopathy. Reproductive: Prostate not enlarged. Other: Small amount of free fluid in the pelvis. Musculoskeletal: No acute skeletal abnormality. IMPRESSION: 1. Severe fatty infiltration liver.  No biliary dilatation. 2. Edema in the right colon. Possible infectious or ischemic colitis. Review of the prior CT reveals edema in the cecum with possible  intussusception of the ileum into the colon. This appears to have resolved in the interval. Electronically Signed   By: CFranchot GalloM.D.   On: 07/21/2019 20:24   Scheduled Meds: . feeding supplement (ENSURE ENLIVE)  237 mL Oral BID BM  . folic acid  1 mg Oral Daily  . hyoscyamine  0.125 mg Sublingual TID AC  . multivitamin with minerals  1 tablet Oral Daily  . thiamine  100 mg Oral Daily   Continuous Infusions: . 0.9 % NaCl with KCl 40 mEq / L 10 mL/hr (07/23/19 1001)    Active Problems:   Elevated liver enzymes   RUQ pain   Jaundice   Alcoholic hepatitis without ascites   Time spent:   CIrwin Brakeman MD [facility] 07/23/2019, 1:06 PM    LOS: 2 days  How to contact the [facility] HospitalAttending or  Consulting provider [place] or covering provider during after hours [place], for this patient?  1. Check the care team in [facility] and look for a) attending/consulting TRH provider listed and b) the [place] team listed 2. Log into [email or web address] and use [place]'s universal password to access. If you do not have the password, please contact the hospital operator. 3. Locate the [facility] provider you are looking for under [facility] and page to a number that you can be directly reached. 4. If you still have difficulty reaching the provider, please page the [facility] (Director on Call) for the Hospitalists listed on amion for assistance.

## 2019-07-23 NOTE — Progress Notes (Signed)
Subjective:  Feeling ok. Tolerating diet. Continues to have right sided abdominal pain, both ruq and rlq. No N/v. Last BM two days ago. No melena in 3 weeks.  Complains of ongoing abdominal distention despite 40 pound weight loss over the past year. Feels like he is not passing enough urine. Does have issues starting stream.   Objective: Vital signs in last 24 hours: Temp:  [98.1 F (36.7 C)-99.6 F (37.6 C)] 98.1 F (36.7 C) (04/15 0550) Pulse Rate:  [88-95] 88 (04/15 0550) Resp:  [17-18] 18 (04/15 0550) BP: (106-126)/(70-87) 106/70 (04/15 0550) SpO2:  [96 %-99 %] 96 % (04/15 0550) Last BM Date: 07/21/19 General:   Alert,  Well-developed, well-nourished, pleasant and cooperative in NAD Head:  Normocephalic and atraumatic. Eyes:  Sclera clear, no icterus.  Abdomen:  Soft,  nondistended.  Mild right sided abdominal tenderness. Liver easily palpable in ruq. Normal bowel sounds, without guarding, and without rebound.   Extremities:  Without clubbing, deformity or edema. Neurologic:  Alert and  oriented x4;  grossly normal neurologically. Skin:  Intact without significant lesions or rashes. Psych:  Alert and cooperative. Normal mood and affect.  Intake/Output from previous day: 04/14 0701 - 04/15 0700 In: 1815.7 [P.O.:720; I.V.:953.7; IV Piggyback:142] Out: 810 [Urine:810] Intake/Output this shift: No intake/output data recorded.  Lab Results: CBC Recent Labs    07/21/19 1457 07/22/19 0240 07/23/19 0526  WBC 8.8 9.1 8.1  HGB 12.4* 10.9* 11.5*  HCT 35.1* 30.3* 32.6*  MCV 105.4* 104.8* 106.5*  PLT 329 314 351   BMET Recent Labs    07/22/19 0853 07/22/19 1621 07/23/19 0526  NA 133* 130* 131*  K 3.7 3.6 3.4*  CL 98 96* 98  CO2 27 24 25   GLUCOSE 101* 171* 92  BUN <5* <5* <5*  CREATININE 0.43* 0.44* 0.37*  CALCIUM 7.8* 7.7* 7.7*   LFTs Recent Labs    07/22/19 0853 07/22/19 1621 07/23/19 0526  BILITOT 10.3* 11.4* 10.3*  BILIDIR 5.9* 7.3*  --   IBILI  --  4.1*   --   ALKPHOS 256* 267* 224*  AST 90* 101* 90*  ALT 36 38 34  PROT 5.1* 5.4* 5.0*  ALBUMIN 2.1* 2.2* 2.0*   Recent Labs    07/21/19 1457  LIPASE 61*   PT/INR Recent Labs    07/21/19 1457 07/23/19 0526  LABPROT 14.5 14.9  INR 1.1 1.2      Lab Results  Component Value Date   IRON 50 07/20/2019   TIBC 131 (L) 07/20/2019   FERRITIN 2,953 (H) 07/14/2019   No results found for: FOLATE   Imaging Studies: US Abdomen Complete  Result Date: 07/21/2019 CLINICAL DATA:  Abdominal distension in jaundice EXAM: ABDOMEN ULTRASOUND COMPLETE COMPARISON:  07/08/2019 FINDINGS: Gallbladder: No gallstones or wall thickening visualized. No sonographic Murphy sign noted by sonographer. Common bile duct: Diameter: 2.2 mm. Liver: Hepatomegaly is noted. Increased echogenicity with heterogeneity is seen consistent with fatty infiltration noted on prior CT examination. Portal vein is patent on color Doppler imaging with normal direction of blood flow towards the liver. IVC: No abnormality visualized. Pancreas: Visualized portion unremarkable. Spleen: Size and appearance within normal limits. Right Kidney: Length: 12.4 cm. Echogenicity within normal limits. No mass or hydronephrosis visualized. Left Kidney: Length: 11.1 cm. 1 cm small cyst is noted in the lower pole of the left kidney. No mass lesion or hydronephrosis is noted. Abdominal aorta: No aneurysm visualized. Other findings: None. IMPRESSION: Fatty liver with hepatomegaly.  No mass lesion is  seen. Small left renal cyst Electronically Signed   By: Inez Catalina M.D.   On: 07/21/2019 17:58   CT ABDOMEN PELVIS W CONTRAST  Result Date: 07/21/2019 CLINICAL DATA:  Elevated LFT and jaundice. Rule out common duct stone EXAM: CT ABDOMEN AND PELVIS WITH CONTRAST TECHNIQUE: Multidetector CT imaging of the abdomen and pelvis was performed using the standard protocol following bolus administration of intravenous contrast. CONTRAST:  154m OMNIPAQUE IOHEXOL 300 MG/ML   SOLN COMPARISON:  Ultrasound abdomen 07/21/2019. CT abdomen pelvis 07/08/2019 FINDINGS: Lower chest: Lung bases clear bilaterally. Hepatobiliary: Marked fatty infiltration liver which is diffusely low density and enlarged. There are scattered patchy areas of fatty sparing in the liver. Gallbladder and bile ducts normal. No biliary dilatation. Pancreas: Negative for mass or edema. Spleen: Negative Adrenals/Urinary Tract: Adrenal glands are unremarkable. Kidneys are normal, without renal calculi, focal lesion, or hydronephrosis. Bladder is unremarkable. Stomach/Bowel: Negative for bowel obstruction. Diverticulosis in the cecum. There is mucosal edema throughout most of the right colon. This appears slightly improved from the prior study and may be due to colitis. Although there are diverticula in the cecum, the edema extends throughout the right colon therefore infectious colitis seems more likely than diverticulitis. Bowel ischemia also a consideration. Normal appendix. Vascular/Lymphatic: Mild atherosclerotic disease. No aneurysm. Negative for lymphadenopathy. Reproductive: Prostate not enlarged. Other: Small amount of free fluid in the pelvis. Musculoskeletal: No acute skeletal abnormality. IMPRESSION: 1. Severe fatty infiltration liver.  No biliary dilatation. 2. Edema in the right colon. Possible infectious or ischemic colitis. Review of the prior CT reveals edema in the cecum with possible intussusception of the ileum into the colon. This appears to have resolved in the interval. Electronically Signed   By: CFranchot GalloM.D.   On: 07/21/2019 20:24   CT ABDOMEN PELVIS W CONTRAST  Result Date: 07/08/2019 CLINICAL DATA:  Right-sided abdominal pain, nausea and vomiting, elevated liver enzymes EXAM: CT ABDOMEN AND PELVIS WITH CONTRAST TECHNIQUE: Multidetector CT imaging of the abdomen and pelvis was performed using the standard protocol following bolus administration of intravenous contrast. CONTRAST:  1079m OMNIPAQUE IOHEXOL 300 MG/ML  SOLN COMPARISON:  02/02/2019 FINDINGS: Lower chest: No acute pleural or parenchymal lung disease. Hepatobiliary: Diffuse hepatic steatosis without focal abnormality. The gallbladder is unremarkable. Pancreas: Unremarkable. No pancreatic ductal dilatation or surrounding inflammatory changes. Spleen: Normal in size without focal abnormality. Adrenals/Urinary Tract: Adrenal glands are unremarkable. Kidneys are normal, without renal calculi, focal lesion, or hydronephrosis. Bladder is unremarkable. Stomach/Bowel: A normal retrocecal appendix is identified. There is masslike thickening within the cecum at the ileocecal valve, measuring 4.4 x 3.1 x 4.8 cm. Neoplasm cannot be excluded, and colonoscopy is recommended for further evaluation. There is no bowel obstruction or ileus. Minimal distal colonic diverticulosis without diverticulitis. Vascular/Lymphatic: Aortic atherosclerosis. No enlarged abdominal or pelvic lymph nodes. Reproductive: Prostate is unremarkable. Other: There several subcentimeter lymph nodes in the right lower quadrant mesentery, nonspecific. I do not see any pathologic adenopathy within the abdomen or pelvis. No free intra-abdominal gas. Musculoskeletal: No acute or destructive bony lesions. Reconstructed images demonstrate no additional findings. IMPRESSION: 1. 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 Colonoscopy is recommended for further evaluation. 2. Diffuse hepatic steatosis without focal abnormality. Electronically Signed   By: MiRanda Ngo.D.   On: 07/08/2019 20:18  [2 weeks]   Assessment: [age]ear old male presenting to the ED with jaundice in the setting of etoh abuse. Patient recently found to have a colon mass  on CT in 06/2019 and completed colonoscopy two days prior to this admission, biopsy was benign.   Jaundice/cholestasis likely due to alcoholic hepatitis rather than obstructive process. He had fever in the ED two  days ago and was placed on Zosyn. His Discriminant function score remains below the thresh hold for prednisolone use (DF 24). Mild fever 4/13.   Right sided abdominal pain: likely multifactorial due to massive hepatomegaly and rlq pain due to intermittent ileocecal intussusception well documented on prior CT in 06/2019. Colonoscopy with benign biopsy (cecal biopsy with benign mucosa with lamina propria edema and desc colon was tubular adenoma). "mass" ball-valved in/out of ICV. Cannot exclude possibility of require right hemicolectomy if persisting symptoms.   Urinary issues with reported decreased urination/hesitancy. Renal function is normal. Postvoid bladder scan with 160cc.   Macrocytic Anemia: no iron deficiency. Check b12/folate.  Plan: 1. Check B12/folate. 2. Strict I/Os.  3. Continue Levsin.  4. Consider d/c Zosyn if being given for cholestasis. Doubt cholangitis.  5. Will continue to follow.   Laureen Ochs. Bernarda Caffey [facility] [phone] 4/15/202111:14 AM     LOS: 2 days

## 2019-07-24 DIAGNOSIS — K7010 Alcoholic hepatitis without ascites: Secondary | ICD-10-CM | POA: Diagnosis not present

## 2019-07-24 DIAGNOSIS — K6389 Other specified diseases of intestine: Secondary | ICD-10-CM

## 2019-07-24 DIAGNOSIS — R748 Abnormal levels of other serum enzymes: Secondary | ICD-10-CM | POA: Diagnosis not present

## 2019-07-24 DIAGNOSIS — R1011 Right upper quadrant pain: Secondary | ICD-10-CM | POA: Diagnosis not present

## 2019-07-24 DIAGNOSIS — R17 Unspecified jaundice: Secondary | ICD-10-CM | POA: Diagnosis not present

## 2019-07-24 LAB — FOLATE RBC
Folate, Hemolysate: 224 ng/mL
Folate, RBC: 640 ng/mL (ref >498)
Hematocrit: 35 % — ABNORMAL LOW (ref 37.5–51.0)

## 2019-07-24 LAB — COMPREHENSIVE METABOLIC PANEL
ALT: 38 U/L (ref 0–44)
AST: 107 U/L — ABNORMAL HIGH (ref 15–41)
Albumin: 2.3 g/dL — ABNORMAL LOW (ref 3.5–5.0)
Alkaline Phosphatase: 261 U/L — ABNORMAL HIGH (ref 38–126)
Anion gap: 10 (ref 5–15)
BUN: 5 mg/dL — ABNORMAL LOW (ref 6–20)
CO2: 26 mmol/L (ref 22–32)
Calcium: 8.2 mg/dL — ABNORMAL LOW (ref 8.9–10.3)
Chloride: 96 mmol/L — ABNORMAL LOW (ref 98–111)
Creatinine, Ser: 0.39 mg/dL — ABNORMAL LOW (ref 0.61–1.24)
GFR calc Af Amer: >60 mL/min (ref >60)
GFR calc non Af Amer: >60 mL/min (ref >60)
Glucose, Bld: 95 mg/dL (ref 70–99)
Potassium: 3.7 mmol/L (ref 3.5–5.1)
Sodium: 132 mmol/L — ABNORMAL LOW (ref 135–145)
Total Bilirubin: 12.5 mg/dL — ABNORMAL HIGH (ref 0.3–1.2)
Total Protein: 6 g/dL — ABNORMAL LOW (ref 6.5–8.1)

## 2019-07-24 LAB — HEMATOLOGY COMMENTS:

## 2019-07-24 LAB — MAGNESIUM: Magnesium: 1.9 mg/dL (ref 1.7–2.4)

## 2019-07-24 NOTE — Progress Notes (Signed)
Subjective: Doing ok overall. Was hoping his labs would trend down quicker. Still with some urinary issues, though improved. Still with right-sided abdominal pain.  Objective: Vital signs in last 24 hours: Temp:  [97.5 F (36.4 C)-99.3 F (37.4 C)] 97.5 F (36.4 C) (04/16 0547) Pulse Rate:  [88-108] 88 (04/16 0547) Resp:  [16-20] 16 (04/16 0547) BP: (106-111)/(65-74) 106/65 (04/16 0547) SpO2:  [95 %-99 %] 95 % (04/16 0547) Last BM Date: 07/21/19 General:   Alert and oriented, pleasant Head:  Normocephalic and atraumatic. Eyes:  Scleral icterus.  Heart:  S1, S2 present, no murmurs noted.  Lungs: Clear to auscultation bilaterally, without wheezing, rales, or rhonchi.  Abdomen:  Bowel sounds present, soft, non-distended. Right-sided TTP. No splenomegaly or hernias noted. Hepatomegaly noted 4-5 fingerbreadths below the right costal margin. No rebound or guarding. No masses appreciated  Msk:  Symmetrical without gross deformities. Pulses:  Normal bilateral DP pulses noted. Extremities:  Without clubbing or edema. Neurologic:  Alert and  oriented x4;  grossly normal neurologically. Skin:  Warm and dry, intact without significant lesions.  Psych:  Alert and cooperative. Normal mood and affect.  Intake/Output from previous day: 04/15 0701 - 04/16 0700 In: 150 [P.O.:150] Out: -  Intake/Output this shift: No intake/output data recorded.  Lab Results: Recent Labs    07/21/19 1457 07/22/19 0240 07/23/19 0526  WBC 8.8 9.1 8.1  HGB 12.4* 10.9* 11.5*  HCT 35.1* 30.3* 32.6*  PLT 329 314 351   BMET Recent Labs    07/22/19 1621 07/23/19 0526 07/24/19 0537  NA 130* 131* 132*  K 3.6 3.4* 3.7  CL 96* 98 96*  CO2 24 25 26   GLUCOSE 171* 92 95  BUN <5* <5* 5*  CREATININE 0.44* 0.37* 0.39*  CALCIUM 7.7* 7.7* 8.2*   LFT Recent Labs    07/22/19 0853 07/22/19 0853 07/22/19 1621 07/23/19 0526 07/24/19 0537  PROT 5.1*   < > 5.4* 5.0* 6.0*  ALBUMIN 2.1*   < > 2.2* 2.0*  2.3*  AST 90*   < > 101* 90* 107*  ALT 36   < > 38 34 38  ALKPHOS 256*   < > 267* 224* 261*  BILITOT 10.3*   < > 11.4* 10.3* 12.5*  BILIDIR 5.9*  --  7.3*  --   --   IBILI  --   --  4.1*  --   --    < > = values in this interval not displayed.   PT/INR Recent Labs    07/21/19 1457 07/23/19 0526  LABPROT 14.5 14.9  INR 1.1 1.2   Hepatitis Panel No results for input(s): HEPBSAG, HCVAB, HEPAIGM, HEPBIGM in the last 72 hours.   Studies/Results: No results found.  Assessment: 49 year old male presenting to the ED with jaundice in the setting of etoh abuse. Patient recently found to have a colon mass on CT in 06/2019 and completed colonoscopy two days prior to this admission, biopsy was benign. Further recommendations pending.  Jaundice/cholestasis- likely due to alcoholic hepatitis rather than obstructive process. He had fever in the ED two days ago and was placed on Zosyn. His Discriminant function score remains below the thresh hold for prednisolone use (DF 24). Mild fever 4/13 but none since. Also question of drug-induced injury from recent propofol colonoscopy. LFTs somewhat worsened today (Alk Phos 224 -> 261, Bili 10.3 -> 12.5).  Right sided abdominal pain- likely multifactorial due to massive hepatomegaly and rlq pain due to intermittent ileocecal intussusception well documented  on prior CT in 06/2019. Colonoscopy with benign biopsy (cecal biopsy with benign mucosa with lamina propria edema and desc colon was tubular adenoma). "mass" ball-valved in/out of ICV. Cannot exclude possibility of require right hemicolectomy if persisting symptoms. Discussed path results and possibility of surgery down the road, official  recommendations to follow.  Urinary issues with reported decreased urination/hesitancy- Renal function is normal. Postvoid bladder scan with 160cc. Just urinated and still with some issues, but improved.  Macrocytic Anemia- no iron deficiency. Vit B12 normal, Folate  pending.  Plan: 1. HFP and INR tomorrow 2. Follow for Folate results 3. Consider urology consult if persistent urinary issues 4. Continue Levsin 5. Supportive measures   Thank you for allowing [place] to participate in the care of Imagene Gurney, DNP, AGNP-C Adult & Gerontological Nurse Practitioner [facility]     LOS: 3 days    07/24/2019, 8:09 AM

## 2019-07-24 NOTE — Progress Notes (Signed)
PROGRESS NOTE [facility]   Christopher Carrillo  GEZ:[id number]  DOB: 07/08/1970  DOA: 07/21/2019 PCP: Alycia Rossetti, MD   Brief Admission Hx: 49 year old gentleman with history of a colon mass, hypertension, depression, psoriasis, alcohol use presented with jaundice and abdominal pain and distention.  He has status post colonoscopy 07/08/2019 Dr. Gala Romney where the mass at the ileocecal valve was biopsied.  He also had a 5 mm polyp in the descending colon that was resected.  MDM/Assessment & Plan:   1. Abdominal pain - patient continues to require IV morphine for symptoms but overall some improvement.  2. Drug induced hepatitis - bilirubin bumped to >12 today.  Continue supportive measures.  3. Colitis-DC zosyn.  Lactate has resolved. 4. Hypokalemia-this is being repleted.  Magnesium repleted. 5. Hypomagnesemia-repleted. 6. Essential hypertension-blood pressure stable we will continue to follow. 7. Chronic alcoholism-patient reports no alcohol in the last 2 weeks.  He has been started on vitamin, folic acid and thiamine. 8. Anemia chronic disease-hemoglobin stable 9. Urinary outlet obstruction - added flomax daily.   DVT prophylaxis: SCDs Code Status: Full Family Communication: wife updated at bedside Disposition Plan: LFTs remain high, still requiring IV pain meds for pain control, continue supportive therapy   Consultants:  GI  Procedures:  N/A  Subjective: Patient reports not feeling well  Objective: Vitals:   07/23/19 2037 07/24/19 0547 07/24/19 0854 07/24/19 1600  BP: 107/70 106/65  107/67  Pulse: (!) 108 88  99  Resp: 16 16    Temp: 99.3 F (37.4 C) (!) 97.5 F (36.4 C)  98.7 F (37.1 C)  TempSrc: Oral Oral  Oral  SpO2: 98% 95% 96% 99%  Weight:      Height:        Intake/Output Summary (Last 24 hours) at 07/24/2019 1710 Last data filed at 07/24/2019 0900 Gross per 24 hour  Intake 390 ml  Output --  Net 390 ml   Filed Weights   07/21/19 1422   Weight: 81.2 kg   REVIEW OF SYSTEMS  As per history otherwise all reviewed and reported negative  Exam:  General exam: Awake, alert, no apparent distress cooperative.  Jaundiced.  Respiratory system: Clear. No increased work of breathing. Cardiovascular system: S1 & S2 heard. No JVD, murmurs, gallops, clicks or pedal edema. Gastrointestinal system: Abdomen is mildly distended, soft and tender in the right lower quadrant and right upper quadrant, hepatomegaly normal bowel sounds heard. Central nervous system: Alert and oriented. No focal neurological deficits. Extremities: 1+ pretibial edema bilateral lower extremities.  Data Reviewed: Basic Metabolic Panel: Recent Labs  Lab 07/21/19 1457 07/21/19 1457 07/22/19 0240 07/22/19 0853 07/22/19 1621 07/23/19 0526 07/24/19 0537  NA 129*   < > 131* 133* 130* 131* 132*  K 2.9*   < > 3.1* 3.7 3.6 3.4* 3.7  CL 93*   < > 97* 98 96* 98 96*  CO2 23   < > 26 27 24 25 26   GLUCOSE 148*   < > 110* 101* 171* 92 95  BUN 5*   < > 5* <5* <5* <5* 5*  CREATININE 0.37*   < > 0.42* 0.43* 0.44* 0.37* 0.39*  CALCIUM 8.3*   < > 7.5* 7.8* 7.7* 7.7* 8.2*  MG 1.7  --   --  1.6*  --  2.0 1.9  PHOS  --   --   --   --   --  2.4*  --    < > = values in this interval  not displayed.   Liver Function Tests: Recent Labs  Lab 07/22/19 0240 07/22/19 0853 07/22/19 1621 07/23/19 0526 07/24/19 0537  AST 89* 90* 101* 90* 107*  ALT 36 36 38 34 38  ALKPHOS 259* 256* 267* 224* 261*  BILITOT 9.4* 10.3* 11.4* 10.3* 12.5*  PROT 5.1* 5.1* 5.4* 5.0* 6.0*  ALBUMIN 2.1* 2.1* 2.2* 2.0* 2.3*   Recent Labs  Lab 07/21/19 1457  LIPASE 61*   No results for input(s): AMMONIA in the last 168 hours. CBC: Recent Labs  Lab 07/21/19 1457 07/22/19 0240 07/23/19 0526 07/23/19 1243  WBC 8.8 9.1 8.1  --   NEUTROABS 5.4  --  4.7  --   HGB 12.4* 10.9* 11.5*  --   HCT 35.1* 30.3* 32.6* 35.0*  MCV 105.4* 104.8* 106.5*  --   PLT 329 314 351  --    Cardiac Enzymes: No  results for input(s): CKTOTAL, CKMB, CKMBINDEX, TROPONINI in the last 168 hours. CBG (last 3)  No results for input(s): GLUCAP in the last 72 hours. Recent Results (from the past 240 hour(s))  SARS CORONAVIRUS 2 (TAT 6-24 HRS) Nasopharyngeal Nasopharyngeal Swab     Status: None   Collection Time: 07/17/19  7:41 AM   Specimen: Nasopharyngeal Swab  Result Value Ref Range Status   SARS Coronavirus 2 NEGATIVE NEGATIVE Final    Comment: (NOTE) SARS-CoV-2 target nucleic acids are NOT DETECTED. The SARS-CoV-2 RNA is generally detectable in upper and lower respiratory specimens during the acute phase of infection. Negative results do not preclude SARS-CoV-2 infection, do not rule out co-infections with other pathogens, and should not be used as the sole basis for treatment or other patient management decisions. Negative results must be combined with clinical observations, patient history, and epidemiological information. The expected result is Negative. Fact Sheet for Patients: SugarRoll.be Fact Sheet for Healthcare Providers: https://www.woods-mathews.com/ This test is not yet approved or cleared by the [place] FDA and  has been authorized for detection and/or diagnosis of SARS-CoV-2 by FDA under an Emergency Use Authorization (EUA). This EUA will remain  in effect (meaning this test can be used) for the duration of the COVID-19 declaration under Section 56 4(b)(1) of the Act, 21 U.S.C. section 360bbb-3(b)(1), unless the authorization is terminated or revoked sooner. Performed at [facility], [place] [address] [place], [place] [address]   Urine culture     Status: None   Collection Time: 07/21/19  3:25 PM   Specimen: Urine, Random  Result Value Ref Range Status   Specimen Description   Final    URINE, RANDOM Performed at [facility], [address] [place], [place] [address]    Special Requests   Final    NONE Performed at [facility], [address] [place], [place] [address]    Culture   Final    NO GROWTH Performed at [facility], [place] [address] [place], [place] [address]    Report Status 07/23/2019 FINAL  Final  Blood culture (routine x 2)     Status: None (Preliminary result)   Collection Time: 07/21/19  7:00 PM   Specimen: Right Antecubital; Blood  Result Value Ref Range Status   Specimen Description RIGHT ANTECUBITAL  Final   Special Requests   Final    BOTTLES DRAWN AEROBIC AND ANAEROBIC Blood Culture adequate volume   Culture   Final    NO GROWTH 3 DAYS Performed at [facility], [address] [place], [place] [address]  Report Status PENDING  Incomplete  Blood culture (routine x 2)     Status: None (Preliminary result)   Collection Time: 07/21/19  7:00 PM   Specimen: Left Antecubital; Blood  Result Value Ref Range Status   Specimen Description LEFT ANTECUBITAL  Final   Special Requests   Final    BOTTLES DRAWN AEROBIC AND ANAEROBIC Blood Culture adequate volume   Culture   Final    NO GROWTH 3 DAYS Performed at [facility], [address] [place],  [address]    Report Status PENDING  Incomplete     Studies: No results found. Scheduled Meds: . feeding supplement (ENSURE ENLIVE)  237 mL Oral BID BM  . folic acid  1 mg Oral Daily  . hyoscyamine  0.125 mg Sublingual TID AC  . multivitamin with minerals  1 tablet Oral Daily  . tamsulosin  0.4 mg Oral QPC supper  . thiamine  100 mg Oral Daily   Continuous Infusions: . 0.9 % NaCl with KCl 40 mEq / L 10 mL/hr (07/23/19 1001)    Active Problems:   Elevated liver enzymes   RUQ pain   Elevated bilirubin   Alcoholic hepatitis without ascites   Time spent:   Irwin Brakeman, MD [facility] 07/24/2019, 5:10 PM    LOS: 3 days  How to contact the [facility] Attending or Consulting provider [place] or covering provider during after hours [place], for this patient?  1. Check the care team in [facility] and look for a)  attending/consulting [facility] provider listed and b) the [facility] team listed 2. Log into [email or web address] and use [facility]'s universal password to access. If you do not have the password, please contact the hospital operator. 3. Locate the [facility] provider you are looking for under [facility] and page to a number that you can be directly reached. 4. If you still have difficulty reaching the provider, please page the [facility] (Director on Call) for the Hospitalists listed on amion for assistance.

## 2019-07-24 NOTE — Evaluation (Signed)
Physical Therapy Evaluation Patient Details Name: Christopher Carrillo MRN: [id number] DOB: 09-Sep-1970 Today's Date: 07/24/2019   History of Present Illness  49 year old gentleman with history of a colon mass, hypertension, depression, psoriasis, alcohol use presented with jaundice and abdominal pain and distention.  He has status post colonoscopy 07/08/2019 Dr. Gala Romney where the mass at the ileocecal valve was biopsied.  He also had a 5 mm polyp in the descending colon that was resected.    Clinical Impression  Patient evaluated by Physical Therapy with no further acute PT needs identified. All education has been completed and the patient has no further questions. Patient independent with all mobility with no concerns for balance or safety. PT did recommend patient ambulated in hallways with or without IV pole when possible to maintain endurance while in acute care setting. Prior to admission, patient active daily and I suspect he will continue when DC from current venue. See below for any follow-up Physical Therapy or equipment needs. PT is signing off. Thank you for this referral.    Follow Up Recommendations No PT follow up    Equipment Recommendations  None recommended by PT    Recommendations for Other Services       Precautions / Restrictions Precautions Precautions: None Restrictions Weight Bearing Restrictions: No      Mobility  Bed Mobility Overal bed mobility: Independent                Transfers Overall transfer level: Independent Equipment used: None                Ambulation/Gait Ambulation/Gait assistance: Independent   Assistive device: None;IV Radiation protection practitioner Pattern/deviations: Step-through pattern;Decreased stride length;Decreased step length - right;Decreased step length - left Gait velocity: slightly decreased   General Gait Details: good safe cadence and endurance for ambulation; able to converse during ambulation without loss of balance  Stairs             Wheelchair Mobility    Modified Rankin (Stroke Patients Only)       Balance   Sitting-balance support: No upper extremity supported;Feet supported Sitting balance-Leahy Scale: Good     Standing balance support: No upper extremity supported;During functional activity;Single extremity supported Standing balance-Leahy Scale: Good Standing balance comment: good without/with IV pole                             Pertinent Vitals/Pain Pain Assessment: 0-10 Pain Score: 6  Pain Location: RUQ and bilateral arms (from IV sticks) Pain Descriptors / Indicators: Aching Pain Intervention(s): Limited activity within patient's tolerance;Monitored during session;Premedicated before session    Home Living Family/patient expects to be discharged to:: Private residence Living Arrangements: Spouse/significant other Available Help at Discharge: Available PRN/intermittently Type of Home: House Home Access: Stairs to enter Entrance Stairs-Rails: None Entrance Stairs-Number of Steps: 1 Home Layout: One level Home Equipment: None      Prior Function Level of Independence: Independent               Hand Dominance   Dominant Hand: Right    Extremity/Trunk Assessment   Upper Extremity Assessment Upper Extremity Assessment: Overall WFL for tasks assessed    Lower Extremity Assessment Lower Extremity Assessment: Overall WFL for tasks assessed    Cervical / Trunk Assessment Cervical / Trunk Assessment: Normal  Communication   Communication: No difficulties  Cognition Arousal/Alertness: Awake/alert Behavior During Therapy: WFL for tasks assessed/performed Overall Cognitive Status: Within Functional Limits for  tasks assessed                                        General Comments      Exercises     Assessment/Plan    PT Assessment Patent does not need any further PT services  PT Problem List         PT Treatment Interventions       PT Goals (Current goals can be found in the Care Plan section)  Acute Rehab PT Goals Patient Stated Goal: Return home PT Goal Formulation: With patient Time For Goal Achievement: 08/07/19 Potential to Achieve Goals: Good    Frequency     Barriers to discharge        Co-evaluation               AM-PAC PT "6 Clicks" Mobility  Outcome Measure Help needed turning from your back to your side while in a flat bed without using bedrails?: None Help needed moving from lying on your back to sitting on the side of a flat bed without using bedrails?: None Help needed moving to and from a bed to a chair (including a wheelchair)?: None Help needed standing up from a chair using your arms (e.g., wheelchair or bedside chair)?: None Help needed to walk in hospital room?: A Little Help needed climbing 3-5 steps with a railing? : A Little 6 Click Score: 22    End of Session   Activity Tolerance: Patient tolerated treatment well Patient left: in chair;with call bell/phone within reach Nurse Communication: Mobility status PT Visit Diagnosis: Unsteadiness on feet (R26.81)    Time: 6270-3500 PT Time Calculation (min) (ACUTE ONLY): 30 min   Charges:   PT Evaluation $PT Eval Low Complexity: 1 Low PT Treatments $Gait Training: 8-22 mins        Floria Raveling. Hartnett-Rands, MS, PT [facility] #[id number] 07/24/2019, 12:58 PM

## 2019-07-25 ENCOUNTER — Encounter: Payer: Self-pay | Admitting: Internal Medicine

## 2019-07-25 DIAGNOSIS — K716 Toxic liver disease with hepatitis, not elsewhere classified: Secondary | ICD-10-CM

## 2019-07-25 DIAGNOSIS — K7010 Alcoholic hepatitis without ascites: Secondary | ICD-10-CM | POA: Diagnosis present

## 2019-07-25 DIAGNOSIS — T50905A Adverse effect of unspecified drugs, medicaments and biological substances, initial encounter: Secondary | ICD-10-CM | POA: Diagnosis not present

## 2019-07-25 LAB — CBC
HCT: 33.9 % — ABNORMAL LOW (ref 39.0–52.0)
Hemoglobin: 11.6 g/dL — ABNORMAL LOW (ref 13.0–17.0)
MCH: 36.5 pg — ABNORMAL HIGH (ref 26.0–34.0)
MCHC: 34.2 g/dL (ref 30.0–36.0)
MCV: 106.6 fL — ABNORMAL HIGH (ref 80.0–100.0)
Platelets: 448 10*3/uL — ABNORMAL HIGH (ref 150–400)
RBC: 3.18 MIL/uL — ABNORMAL LOW (ref 4.22–5.81)
RDW: 16.5 % — ABNORMAL HIGH (ref 11.5–15.5)
WBC: 11.4 10*3/uL — ABNORMAL HIGH (ref 4.0–10.5)
nRBC: 0 % (ref 0.0–0.2)

## 2019-07-25 LAB — PROTIME-INR
INR: 1.2 (ref 0.8–1.2)
Prothrombin Time: 15.3 seconds — ABNORMAL HIGH (ref 11.4–15.2)

## 2019-07-25 LAB — COMPREHENSIVE METABOLIC PANEL
ALT: 35 U/L (ref 0–44)
AST: 106 U/L — ABNORMAL HIGH (ref 15–41)
Albumin: 1.9 g/dL — ABNORMAL LOW (ref 3.5–5.0)
Alkaline Phosphatase: 199 U/L — ABNORMAL HIGH (ref 38–126)
Anion gap: 9 (ref 5–15)
BUN: 5 mg/dL — ABNORMAL LOW (ref 6–20)
CO2: 25 mmol/L (ref 22–32)
Calcium: 7.9 mg/dL — ABNORMAL LOW (ref 8.9–10.3)
Chloride: 97 mmol/L — ABNORMAL LOW (ref 98–111)
Creatinine, Ser: <0.3 mg/dL — ABNORMAL LOW (ref 0.61–1.24)
Glucose, Bld: 89 mg/dL (ref 70–99)
Potassium: 3.5 mmol/L (ref 3.5–5.1)
Sodium: 131 mmol/L — ABNORMAL LOW (ref 135–145)
Total Bilirubin: 12.3 mg/dL — ABNORMAL HIGH (ref 0.3–1.2)
Total Protein: 5 g/dL — ABNORMAL LOW (ref 6.5–8.1)

## 2019-07-25 LAB — MAGNESIUM: Magnesium: 1.8 mg/dL (ref 1.7–2.4)

## 2019-07-25 NOTE — Progress Notes (Signed)
PROGRESS NOTE [facility]   Christopher Carrillo  WIO:[id number]  DOB: 04/06/1971  DOA: 07/21/2019 PCP: Alycia Rossetti, MD   Brief Admission Hx: 49 year old gentleman with history of a colon mass, hypertension, depression, psoriasis, alcohol use presented with jaundice and abdominal pain and distention.  He has status post colonoscopy 07/08/2019 Dr. Gala Romney where the mass at the ileocecal valve was biopsied.  He also had a 5 mm polyp in the descending colon that was resected.  MDM/Assessment & Plan:   1. Abdominal pain - patient continues to require IV morphine for symptoms but overall stable and pain controlled.  2. Drug induced hepatitis - bilirubin remains >12.  GI reaching out to conference with hepatologist.   Continue supportive measures.  3. Colitis-DC zosyn.  Lactate has resolved. 4. Hypokalemia-this is being repleted.  Magnesium repleted. 5. Hypomagnesemia-repleted. 6. Essential hypertension-blood pressure stable we will continue to follow. 7. Chronic alcoholism-patient reports no alcohol in the last 2 weeks prior to admission.   He remains on vitamin, folic acid and thiamine. 8. Anemia chronic disease-hemoglobin stable 9. Urinary outlet obstruction - added flomax daily.  He seems to be urinating better.   DVT prophylaxis: SCDs Code Status: Full Family Communication: wife updated at bedside Disposition Plan: bilirubin remains high, GI reaching out to hepatologists at [facility], still requiring IV pain meds for pain control, continue supportive therapy   Consultants:  GI  Procedures:  N/A  Subjective: Patient reports he feels stable but not baseline well. He is having bowel movements.    Objective: Vitals:   07/24/19 1600 07/24/19 2108 07/25/19 0512 07/25/19 0842  BP: 107/67 122/78 98/61   Pulse: 99 (!) 101 89   Resp:  19 18   Temp: 98.7 F (37.1 C) 99 F (37.2 C) 98.2 F (36.8 C)   TempSrc: Oral Oral Oral   SpO2: 99% 97% 97% 96%   Weight:      Height:       No intake or output data in the 24 hours ending 07/25/19 1553 Filed Weights   07/21/19 1422  Weight: 81.2 kg   REVIEW OF SYSTEMS  As per history otherwise all reviewed and reported negative  Exam:  General exam: Awake, alert, no apparent distress cooperative.  Jaundiced appearance.  No scratching behavior seen.   Respiratory system: Clear. No increased work of breathing. Cardiovascular system: S1 & S2 heard. No JVD, murmurs, gallops, clicks or pedal edema. Gastrointestinal system: Abdomen is mildly distended, soft and tender in the right lower quadrant and right upper quadrant, hepatomegaly normal bowel sounds heard. Central nervous system: Alert and oriented. No focal neurological deficits. Extremities: 1+ pretibial edema bilateral lower extremities.  Data Reviewed: Basic Metabolic Panel: Recent Labs  Lab 07/21/19 1457 07/22/19 0240 07/22/19 0853 07/22/19 1621 07/23/19 0526 07/24/19 0537 07/25/19 0745  NA 129*   < > 133* 130* 131* 132* 131*  K 2.9*   < > 3.7 3.6 3.4* 3.7 3.5  CL 93*   < > 98 96* 98 96* 97*  CO2 23   < > 27 24 25 26 25   GLUCOSE 148*   < > 101* 171* 92 95 89  BUN 5*   < > <5* <5* <5* 5* 5*  CREATININE 0.37*   < > 0.43* 0.44* 0.37* 0.39* <0.30*  CALCIUM 8.3*   < > 7.8* 7.7* 7.7* 8.2* 7.9*  MG 1.7  --  1.6*  --  2.0 1.9 1.8  PHOS  --   --   --   --  2.4*  --   --    < > = values in this interval not displayed.   Liver Function Tests: Recent Labs  Lab 07/22/19 0853 07/22/19 1621 07/23/19 0526 07/24/19 0537 07/25/19 0745  AST 90* 101* 90* 107* 106*  ALT 36 38 34 38 35  ALKPHOS 256* 267* 224* 261* 199*  BILITOT 10.3* 11.4* 10.3* 12.5* 12.3*  PROT 5.1* 5.4* 5.0* 6.0* 5.0*  ALBUMIN 2.1* 2.2* 2.0* 2.3* 1.9*   Recent Labs  Lab 07/21/19 1457  LIPASE 61*   No results for input(s): AMMONIA in the last 168 hours. CBC: Recent Labs  Lab 07/21/19 1457 07/22/19 0240 07/23/19 0526 07/23/19 1243 07/25/19 0747  WBC 8.8  9.1 8.1  --  11.4*  NEUTROABS 5.4  --  4.7  --   --   HGB 12.4* 10.9* 11.5*  --  11.6*  HCT 35.1* 30.3* 32.6* 35.0* 33.9*  MCV 105.4* 104.8* 106.5*  --  106.6*  PLT 329 314 351  --  448*   Cardiac Enzymes: No results for input(s): CKTOTAL, CKMB, CKMBINDEX, TROPONINI in the last 168 hours. CBG (last 3)  No results for input(s): GLUCAP in the last 72 hours. Recent Results (from the past 240 hour(s))  SARS CORONAVIRUS 2 (TAT 6-24 HRS) Nasopharyngeal Nasopharyngeal Swab     Status: None   Collection Time: 07/17/19  7:41 AM   Specimen: Nasopharyngeal Swab  Result Value Ref Range Status   SARS Coronavirus 2 NEGATIVE NEGATIVE Final    Comment: (NOTE) SARS-CoV-2 target nucleic acids are NOT DETECTED. The SARS-CoV-2 RNA is generally detectable in upper and lower respiratory specimens during the acute phase of infection. Negative results do not preclude SARS-CoV-2 infection, do not rule out co-infections with other pathogens, and should not be used as the sole basis for treatment or other patient management decisions. Negative results must be combined with clinical observations, patient history, and epidemiological information. The expected result is Negative. Fact Sheet for Patients: SugarRoll.be Fact Sheet for Healthcare Providers: https://www.woods-mathews.com/ This test is not yet approved or cleared by the [place] FDA and  has been authorized for detection and/or diagnosis of SARS-CoV-2 by FDA under an Emergency Use Authorization (EUA). This EUA will remain  in effect (meaning this test can be used) for the duration of the COVID-19 declaration under Section 56 4(b)(1) of the Act, 21 U.S.C. section 360bbb-3(b)(1), unless the authorization is terminated or revoked sooner. Performed at [facility], [place] [address], [place], [place] [address]   Urine culture     Status: None   Collection Time: 07/21/19  3:25 PM   Specimen: Urine,  Random  Result Value Ref Range Status   Specimen Description   Final    URINE, RANDOM Performed at [facility], [address], [place], [place] [address]    Special Requests   Final    NONE Performed at [facility], [address] [place], [place] [address]    Culture   Final    NO GROWTH Performed at [facility], [place] [address] [place], [place] [address]    Report Status 07/23/2019 FINAL  Final  Blood culture (routine x 2)     Status: None (Preliminary result)   Collection Time: 07/21/19  7:00 PM   Specimen: Right Antecubital; Blood  Result Value Ref Range Status   Specimen Description RIGHT ANTECUBITAL  Final   Special Requests   Final    BOTTLES DRAWN AEROBIC AND ANAEROBIC Blood Culture adequate volume  Culture   Final    NO GROWTH 4 DAYS Performed at [facility], [address] [place], [place] [address]    Report Status PENDING  Incomplete  Blood culture (routine x 2)     Status: None (Preliminary result)   Collection Time: 07/21/19  7:00 PM   Specimen: Left Antecubital; Blood  Result Value Ref Range Status   Specimen Description LEFT ANTECUBITAL  Final   Special Requests   Final    BOTTLES DRAWN AEROBIC AND ANAEROBIC Blood Culture adequate volume   Culture   Final    NO GROWTH 4 DAYS Performed at [facility], [address], [place], [place] [address]    Report Status PENDING  Incomplete     Studies: No results found. Scheduled Meds: . feeding supplement (ENSURE ENLIVE)  237 mL Oral BID BM  . folic acid  1 mg Oral Daily  . hyoscyamine  0.125 mg Sublingual TID AC  . multivitamin with minerals  1 tablet Oral Daily  . tamsulosin  0.4 mg Oral QPC supper  . thiamine  100 mg Oral Daily   Continuous Infusions: . 0.9 % NaCl with KCl 40 mEq / L 10 mL/hr (07/23/19 1001)    Active Problems:   Elevated liver enzymes   RUQ pain   Elevated bilirubin   Alcoholic hepatitis without ascites   Time spent:   Irwin Brakeman, MD [facility] 07/25/2019, 3:53 PM    LOS: 4 days  How to contact the [facility] Attending or Consulting provider [place] or covering provider during after hours [place], for this patient?  1. Check the care team in [facility] and look for a) attending/consulting [facility] provider listed and b) the [facility] team listed 2. Log into [email or web address] and use [place]'s universal password to access. If you do not have the password, please contact the hospital operator. 3. Locate the [facility] provider you are looking for under [facility] and page to a number that you can be directly reached. 4. If you still have difficulty reaching the provider, please page the [facility] (Director on Call) for the Hospitalists listed on amion for assistance.

## 2019-07-25 NOTE — Progress Notes (Signed)
   Assessment/Plan: Admitted with acute on chronic liver failure. CLINICALLY STABLE. T BILI STABLE AT 12.  PLAN: 1. SUPPORTIVE CARE. 2. DISCUSS CASE WITH [facility]. 3. BMP IN AM.    Subjective: Since I last evaluated the patient HIS RUQ IS BETTER BIT NOT RESOLVED. MILD SWELLING IN LEGS. NO ABDOMINAL OR CHEST PAIN.  Objective: Vital signs in last 24 hours: Vitals:   07/25/19 0512 07/25/19 0842  BP: 98/61   Pulse: 89   Resp: 18   Temp: 98.2 F (36.8 C)   SpO2: 97% 96%       General appearance: alert, cooperative and appears older than stated age Resp: clear to auscultation bilaterally Cardio: regular rate and rhythm GI: soft, non-tender; bowel sounds normal; no masses,  no organomegaly Extremities: edema TRACE-1+ BIL LEs  Lab Results:  T BILI 12.1    Studies/Results: No results found.  Medications; I PERSONALLY REVIEWED THE MED LIST.

## 2019-07-26 LAB — CULTURE, BLOOD (ROUTINE X 2)
Culture: NO GROWTH
Culture: NO GROWTH
Special Requests: ADEQUATE
Special Requests: ADEQUATE

## 2019-07-26 LAB — COMPREHENSIVE METABOLIC PANEL
ALT: 35 U/L (ref 0–44)
AST: 113 U/L — ABNORMAL HIGH (ref 15–41)
Albumin: 1.8 g/dL — ABNORMAL LOW (ref 3.5–5.0)
Alkaline Phosphatase: 182 U/L — ABNORMAL HIGH (ref 38–126)
Anion gap: 11 (ref 5–15)
BUN: 7 mg/dL (ref 6–20)
CO2: 23 mmol/L (ref 22–32)
Calcium: 7.7 mg/dL — ABNORMAL LOW (ref 8.9–10.3)
Chloride: 96 mmol/L — ABNORMAL LOW (ref 98–111)
Creatinine, Ser: <0.3 mg/dL — ABNORMAL LOW (ref 0.61–1.24)
Glucose, Bld: 81 mg/dL (ref 70–99)
Potassium: 3.7 mmol/L (ref 3.5–5.1)
Sodium: 130 mmol/L — ABNORMAL LOW (ref 135–145)
Total Bilirubin: 13.4 mg/dL — ABNORMAL HIGH (ref 0.3–1.2)
Total Protein: 5.1 g/dL — ABNORMAL LOW (ref 6.5–8.1)

## 2019-07-26 LAB — PHOSPHORUS: Phosphorus: 3.5 mg/dL (ref 2.5–4.6)

## 2019-07-26 LAB — MAGNESIUM: Magnesium: 1.9 mg/dL (ref 1.7–2.4)

## 2019-07-26 MED ORDER — ADULT MULTIVITAMIN W/MINERALS CH: 1.0000 | ORAL_TABLET | Freq: Every day | ORAL | Status: DC

## 2019-07-26 MED ORDER — OXYCODONE HCL 5 MG PO TABS: 5.0000 mg | ORAL_TABLET | Freq: Four times a day (QID) | ORAL | 0 refills | Status: DC | PRN

## 2019-07-26 MED ORDER — TAMSULOSIN HCL 0.4 MG PO CAPS: 0.4000 mg | ORAL_CAPSULE | Freq: Every day | ORAL | 0 refills | Status: DC

## 2019-07-26 MED ORDER — FOLIC ACID 1 MG PO TABS: 1.0000 mg | ORAL_TABLET | Freq: Every day | ORAL | Status: DC

## 2019-07-26 MED ORDER — THIAMINE HCL 100 MG PO TABS: 100.0000 mg | ORAL_TABLET | Freq: Every day | ORAL | Status: DC

## 2019-07-26 MED ORDER — POLYETHYLENE GLYCOL 3350 17 G PO PACK: 17.0000 g | PACK | Freq: Every day | ORAL | 2 refills | Status: DC | PRN

## 2019-07-26 NOTE — Progress Notes (Signed)
   Assessment/Plan: Admitted with acute onset of worsening jaundice. T BILI PLATEAUED AT 12. LAST ETOH 3 WEEKS AGO.  HE IS CHILD PUGH B(8) AND MELD SCORE 24. ALK PHOS AND AST TRENDING DOWN. ALT /PT/INR: NL.  PLAN: 1. I PERSONALLY DISCUSSED WITH [facility](DR. Lennette Bihari [place]). NO NEW RECOMMENDATIONS. 2. SUPPORTIVE CARE. 3. OK TO D/C HOME, 4. CHECK CMP, DIRECT BILI ORN THUR APR 22. 5. REFER TO [facility]. 6. STOP LEVSIN  Subjective: Since I last evaluated the patient HE HAS MILD RUQ PAIN. TOLERATING POs. REMAINS JAUNDICE. HAD 3 BMs YESTERDAY. FEELS BLOATED.  Objective: Vital signs in last 24 hours: Vitals:   07/25/19 2115 07/26/19 0500  BP: 113/76 106/67  Pulse: 98 96  Resp: 19 16  Temp: 98.6 F (37 C) 98.2 F (36.8 C)  SpO2: 98% 98%   General appearance: alert, cooperative and no distress Resp: clear to auscultation bilaterally Cardio: regular rate and rhythm GI: soft, mildly tender in RUQ; bowel sounds normal; MILD DISTENTION Extremities: edema 1+  Lab Results:  ALK PHOS 199 AST 106 ALT 35 T BILI 12.3   Studies/Results: No results found.  Medications:

## 2019-07-26 NOTE — Discharge Instructions (Signed)
Jaundice, Adult  Jaundice is when the skin, the whites of the eyes, and the lining of the mouth and nose (mucous membranes) turn a yellowish color. It is caused by having too much bilirubin in the blood. Bilirubin is made by the normal breakdown of red blood cells. Having jaundice means that your body's bile system may not be working as it should. The bile system is made up of the liver, the gallbladder, and the bile ducts. They work together to make, store, and move bile. Jaundice may be caused by drinking too much alcohol. It may also be caused by liver disease, infections, cancers, or some medicines. Your doctor may treat you with medicine, fluids, or surgery. Follow these instructions at home:   Take over-the-counter and prescription medicines only as told by your doctor.  You can use skin lotion to help with itching.  Drink plenty of fluids.  Do not drink alcohol.  Keep all follow-up visits as told by your doctor. This is important. Contact a doctor if:  You have a fever.  You have swelling or pain in your belly (abdomen). Get help right away if:  Your symptoms get worse all of a sudden.  Your pain gets worse.  You keep throwing up (vomiting).  You throw up blood.  You become weak or confused.  You get a very bad headache.  You have blood in your poop.  You lose too much body fluid (dehydration). Signs that have you lost too much body fluid include: ? A very dry mouth. ? A fast, weak pulse. ? Fast breathing. ? Blue lips. Summary  Jaundice is when the skin, the whites of the eyes, and the lining of the mouth and nose turn a yellowish color.  Jaundice may be caused by a problem in the liver, the gallbladder, and the bile ducts.  Follow your doctor's instructions for home care. These include drinking plenty of fluid, not drinking alcohol, and taking medicines as told.  Get help right away if your symptoms get worse, you feel weak or confused, you throw up, you  have blood in your poop, you have a very bad headache, or you lose too much body fluid. This information is not intended to replace advice given to you by your health care provider. Make sure you discuss any questions you have with your health care provider. Document Revised: 03/29/2017 Document Reviewed: 03/29/2017 Elsevier Patient Education  2020 [place].    IMPORTANT INFORMATION: PAY CLOSE ATTENTION   PHYSICIAN DISCHARGE INSTRUCTIONS  Follow with Primary care provider  Alycia Rossetti, MD  and other consultants as instructed by your Hospitalist Physician  [place] IF SYMPTOMS COME BACK, WORSEN OR NEW PROBLEM DEVELOPS   Please note: You were cared for by a hospitalist during your hospital stay. Every effort will be made to forward records to your primary care provider.  You can request that your primary care provider send for your hospital records if they have not received them.  Once you are discharged, your primary care physician will handle any further medical issues. Please note that NO REFILLS for any discharge medications will be authorized once you are discharged, as it is imperative that you return to your primary care physician (or establish a relationship with a primary care physician if you do not have one) for your post hospital discharge needs so that they can reassess your need for medications and monitor your lab values.  Please get a complete  blood count and chemistry panel checked by your Primary MD at your next visit, and again as instructed by your Primary MD.  Get Medicines reviewed and adjusted: Please take all your medications with you for your next visit with your Primary MD  Laboratory/radiological data: Please request your Primary MD to go over all hospital tests and procedure/radiological results at the follow up, please ask your primary care provider to get all Hospital records sent to his/her office.  In some cases,  they will be blood work, cultures and biopsy results pending at the time of your discharge. Please request that your primary care provider follow up on these results.  If you are diabetic, please bring your blood sugar readings with you to your follow up appointment with primary care.    Please call and make your follow up appointments as soon as possible.    Also Note the following: If you experience worsening of your admission symptoms, develop shortness of breath, life threatening emergency, suicidal or homicidal thoughts you must seek medical attention immediately by calling 911 or calling your MD immediately  if symptoms less severe.  You must read complete instructions/literature along with all the possible adverse reactions/side effects for all the Medicines you take and that have been prescribed to you. Take any new Medicines after you have completely understood and accpet all the possible adverse reactions/side effects.   Do not drive when taking Pain medications or sleeping medications (Benzodiazepines)  Do not take more than prescribed Pain, Sleep and Anxiety Medications. It is not advisable to combine anxiety,sleep and pain medications without talking with your primary care practitioner  Special Instructions: If you have smoked or chewed Tobacco  in the last 2 yrs please stop smoking, stop any regular Alcohol  and or any Recreational drug use.  Wear Seat belts while driving.  Do not drive if taking any narcotic, mind altering or controlled substances or recreational drugs or alcohol.

## 2019-07-26 NOTE — Discharge Summary (Addendum)
Physician Discharge Summary  Christopher Carrillo FTD:[id number] DOB: [birth year] DOA: 07/21/2019  PCP: Alycia Rossetti, MD GI: [facility]  Admit date: 07/21/2019 Discharge date: 07/26/2019  Admitted From:  Home  Disposition: Home   Recommendations for Outpatient Follow-up:  Follow up with PCP in 1 weeks Follow up with [facility] in 2 weeks Please obtain CMP in one week Establish care with [facility] in 2 weeks.   Discharge Condition: STABLE   CODE STATUS: FULL    Brief Hospitalization Summary: Please see all hospital notes, images, labs for full details of the hospitalization. ADMISSION HPI: Christopher Carrillo is a 49 y.o. male with medical history significant for colon mass, depression, hypertension, psoriasis, alcohol use. Presented to the ED with complaints of yellowing of skin that was noticed a few days ago, this morning it became quite obvious and so patient presented to the ED.  He also reports abdominal swelling.  Reports some mild abdominal pain no right side of his abdomen present when he sits up and takes a deep breath, otherwise he is without pain.  Also reports dark urine despite drinking lots of water. He had colon prep and hence multiple loose stools and then colonoscopy yesterday for CT findings of colon mass 3/31.  Otherwise he denies vomiting or loose stools.    ED Course: Temp  100.8, heart rate 113, WBC 8.8.  Sodium 129.  Potassium 2.9.  ALP 322, total bilirubin 9.2.  UA positive nitrite trace leukocytes few bacteria.  Right upper quadrant ultrasound-fatty liver with hepatomegaly. EDP, talked to GI, Dr. Oneida Alar, recommended CT abdomen and pelvis which showed severe fatty liver infiltration.  Edema in the right colon, possibly infectious or ischemic colitis. Blood and urine cultures obtained, patient was started on IV Zosyn.  Brief Admission Hx: 49 year old gentleman with history of a colon mass, hypertension, depression, psoriasis, alcohol use presented with  jaundice and abdominal pain and distention.  He has status post colonoscopy 07/08/2019 Dr. Gala Romney where the mass at the ileocecal valve was biopsied.  He also had a 5 mm polyp in the descending colon that was resected.   MDM/Assessment & Plan:   Abdominal pain - Pain much better controlled now.  Discussed with GI Dr. Oneida Alar, can DC home toay with outpatient follow up.  Oral pain meds per GI with no tylenol recommended.  Drug induced hepatitis from anesthesia drugs (ketamine/propofol)- bilirubin remains >12 but overall LFTs are stabilizing.  GI spoke with hepatologist in [facility]).   Outpatient GI follow up has been arranged.  Colitis-DC zosyn.  Lactate has resolved. Hypokalemia-this was repleted.  Magnesium repleted. Hypomagnesemia-repleted. Essential hypertension-blood pressure stable we will continue to follow. Chronic alcoholism-patient reports no alcohol in the last 2 weeks prior to admission.   He remains on vitamin, folic acid and thiamine. Anemia chronic disease-hemoglobin stable Urinary outlet obstruction - added flomax daily.  He seems to be urinating better.   Referral made for urology office outpatient follow-up.   DVT prophylaxis: SCDs Code Status: Full Family Communication: wife updated at bedside Disposition Plan: Home with close outpatient follow up.      Consultants: GI  Discharge Diagnoses:  Principal Problem:   Drug-induced hepatic toxicity Active Problems:   Elevated liver enzymes   RUQ pain   Elevated bilirubin   Alcoholic hepatitis without ascites   Discharge Instructions: Discharge Instructions     Ambulatory referral to Urology   Complete by: As directed       Allergies as of 07/26/2019  No Known Allergies      Medication List     TAKE these medications    folic acid 1 MG tablet Commonly known as: FOLVITE Take 1 tablet (1 mg total) by mouth daily. Start taking on: July 27, 2019   multivitamin with minerals Tabs tablet Take  1 tablet by mouth daily. Start taking on: July 27, 2019   oxyCODONE 5 MG immediate release tablet Commonly known as: Oxy IR/ROXICODONE Take 1 tablet (5 mg total) by mouth every 6 (six) hours as needed for up to 7 days for severe pain.   polyethylene glycol 17 g packet Commonly known as: MIRALAX / GLYCOLAX Take 17 g by mouth daily as needed for mild constipation.   tamsulosin 0.4 MG Caps capsule Commonly known as: FLOMAX Take 1 capsule (0.4 mg total) by mouth daily after supper.   thiamine 100 MG tablet Take 1 tablet (100 mg total) by mouth daily. Start taking on: July 27, 2019       Follow-up Information     [place], Modena Nunnery, MD. Schedule an appointment as soon as possible for a visit in 1 week(s).   Specialty: Family Medicine Why: Hospital Follow Up  Contact information: [place] [address] [phone]         [facility]. Schedule an appointment as soon as possible for a visit in 2 week(s).   Contact information: [address] [place] [phone]          No Known Allergies Allergies as of 07/26/2019   No Known Allergies      Medication List     TAKE these medications    folic acid 1 MG tablet Commonly known as: FOLVITE Take 1 tablet (1 mg total) by mouth daily. Start taking on: July 27, 2019   multivitamin with minerals Tabs tablet Take 1 tablet by mouth daily. Start taking on: July 27, 2019   oxyCODONE 5 MG immediate release tablet Commonly known as: Oxy IR/ROXICODONE Take 1 tablet (5 mg total) by mouth every 6 (six) hours as needed for up to 7 days for severe pain.   polyethylene glycol 17 g packet Commonly known as: MIRALAX / GLYCOLAX Take 17 g by mouth daily as needed for mild constipation.   tamsulosin 0.4 MG Caps capsule Commonly known as: FLOMAX Take 1 capsule (0.4 mg total) by mouth daily after supper.   thiamine 100 MG tablet Take 1 tablet (100 mg  total) by mouth daily. Start taking on: July 27, 2019        Procedures/Studies: US Abdomen Complete  Result Date: 07/21/2019 CLINICAL DATA:  Abdominal distension in jaundice EXAM: ABDOMEN ULTRASOUND COMPLETE COMPARISON:  07/08/2019 FINDINGS: Gallbladder: No gallstones or wall thickening visualized. No sonographic Murphy sign noted by sonographer. Common bile duct: Diameter: 2.2 mm. Liver: Hepatomegaly is noted. Increased echogenicity with heterogeneity is seen consistent with fatty infiltration noted on prior CT examination. Portal vein is patent on color Doppler imaging with normal direction of blood flow towards the liver. IVC: No abnormality visualized. Pancreas: Visualized portion unremarkable. Spleen: Size and appearance within normal limits. Right Kidney: Length: 12.4 cm. Echogenicity within normal limits. No mass or hydronephrosis visualized. Left Kidney: Length: 11.1 cm. 1 cm small cyst is noted in the lower pole of the left kidney. No mass lesion or hydronephrosis is noted. Abdominal aorta: No aneurysm visualized. Other findings: None. IMPRESSION: Fatty liver with hepatomegaly.  No mass lesion is seen. Small left renal  cyst Electronically Signed   By: Inez Catalina M.D.   On: 07/21/2019 17:58   CT ABDOMEN PELVIS W CONTRAST  Result Date: 07/21/2019 CLINICAL DATA:  Elevated LFT and jaundice. Rule out common duct stone EXAM: CT ABDOMEN AND PELVIS WITH CONTRAST TECHNIQUE: Multidetector CT imaging of the abdomen and pelvis was performed using the standard protocol following bolus administration of intravenous contrast. CONTRAST:  15m OMNIPAQUE IOHEXOL 300 MG/ML  SOLN COMPARISON:  Ultrasound abdomen 07/21/2019. CT abdomen pelvis 07/08/2019 FINDINGS: Lower chest: Lung bases clear bilaterally. Hepatobiliary: Marked fatty infiltration liver which is diffusely low density and enlarged. There are scattered patchy areas of fatty sparing in the liver. Gallbladder and bile ducts normal. No biliary  dilatation. Pancreas: Negative for mass or edema. Spleen: Negative Adrenals/Urinary Tract: Adrenal glands are unremarkable. Kidneys are normal, without renal calculi, focal lesion, or hydronephrosis. Bladder is unremarkable. Stomach/Bowel: Negative for bowel obstruction. Diverticulosis in the cecum. There is mucosal edema throughout most of the right colon. This appears slightly improved from the prior study and may be due to colitis. Although there are diverticula in the cecum, the edema extends throughout the right colon therefore infectious colitis seems more likely than diverticulitis. Bowel ischemia also a consideration. Normal appendix. Vascular/Lymphatic: Mild atherosclerotic disease. No aneurysm. Negative for lymphadenopathy. Reproductive: Prostate not enlarged. Other: Small amount of free fluid in the pelvis. Musculoskeletal: No acute skeletal abnormality. IMPRESSION: 1. Severe fatty infiltration liver.  No biliary dilatation. 2. Edema in the right colon. Possible infectious or ischemic colitis. Review of the prior CT reveals edema in the cecum with possible intussusception of the ileum into the colon. This appears to have resolved in the interval. Electronically Signed   By: CFranchot GalloM.D.   On: 07/21/2019 20:24   CT ABDOMEN PELVIS W CONTRAST  Result Date: 07/08/2019 CLINICAL DATA:  Right-sided abdominal pain, nausea and vomiting, elevated liver enzymes EXAM: CT ABDOMEN AND PELVIS WITH CONTRAST TECHNIQUE: Multidetector CT imaging of the abdomen and pelvis was performed using the standard protocol following bolus administration of intravenous contrast. CONTRAST:  1025mOMNIPAQUE IOHEXOL 300 MG/ML  SOLN COMPARISON:  02/02/2019 FINDINGS: Lower chest: No acute pleural or parenchymal lung disease. Hepatobiliary: Diffuse hepatic steatosis without focal abnormality. The gallbladder is unremarkable. Pancreas: Unremarkable. No pancreatic ductal dilatation or surrounding inflammatory changes. Spleen: Normal  in size without focal abnormality. Adrenals/Urinary Tract: Adrenal glands are unremarkable. Kidneys are normal, without renal calculi, focal lesion, or hydronephrosis. Bladder is unremarkable. Stomach/Bowel: A normal retrocecal appendix is identified. There is masslike thickening within the cecum at the ileocecal valve, measuring 4.4 x 3.1 x 4.8 cm. Neoplasm cannot be excluded, and colonoscopy is recommended for further evaluation. There is no bowel obstruction or ileus. Minimal distal colonic diverticulosis without diverticulitis. Vascular/Lymphatic: Aortic atherosclerosis. No enlarged abdominal or pelvic lymph nodes. Reproductive: Prostate is unremarkable. Other: There several subcentimeter lymph nodes in the right lower quadrant mesentery, nonspecific. I do not see any pathologic adenopathy within the abdomen or pelvis. No free intra-abdominal gas. Musculoskeletal: No acute or destructive bony lesions. Reconstructed images demonstrate no additional findings. IMPRESSION: 1. 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 Colonoscopy is recommended for further evaluation. 2. Diffuse hepatic steatosis without focal abnormality. Electronically Signed   By: MiRanda Ngo.D.   On: 07/08/2019 20:18     Subjective: Pt having bowel movements, tolerating diet, pain controlled,  No pruritus symptoms.    Discharge Exam: Vitals:   07/25/19 2115 07/26/19 0500  BP: 113/76  106/67  Pulse: 98 96  Resp: 19 16  Temp: 98.6 F (37 C) 98.2 F (36.8 C)  SpO2: 98% 98%   Vitals:   07/25/19 1800 07/25/19 2023 07/25/19 2115 07/26/19 0500  BP: 120/78  113/76 106/67  Pulse: 79  98 96  Resp: 18  19 16   Temp: 98.7 F (37.1 C)  98.6 F (37 C) 98.2 F (36.8 C)  TempSrc: Oral     SpO2: 99% 99% 98% 98%  Weight:      Height:       General exam: Awake, alert, no apparent distress cooperative.  Jaundiced appearance.  No scratching behavior seen.   Respiratory system: Clear. No increased work  of breathing. Cardiovascular system: S1 & S2 heard. No JVD, murmurs, gallops, clicks or pedal edema. Gastrointestinal system: Abdomen is  soft and nontender normal bowel sounds heard. Central nervous system: Alert and oriented. No focal neurological deficits. Extremities: trace pretibial edema bilateral lower extremities.    The results of significant diagnostics from this hospitalization (including imaging, microbiology, ancillary and laboratory) are listed below for reference.     Microbiology: Recent Results (from the past 240 hour(s))  SARS CORONAVIRUS 2 (TAT 6-24 HRS) Nasopharyngeal Nasopharyngeal Swab     Status: None   Collection Time: 07/17/19  7:41 AM   Specimen: Nasopharyngeal Swab  Result Value Ref Range Status   SARS Coronavirus 2 NEGATIVE NEGATIVE Final    Comment: (NOTE) SARS-CoV-2 target nucleic acids are NOT DETECTED. The SARS-CoV-2 RNA is generally detectable in upper and lower respiratory specimens during the acute phase of infection. Negative results do not preclude SARS-CoV-2 infection, do not rule out co-infections with other pathogens, and should not be used as the sole basis for treatment or other patient management decisions. Negative results must be combined with clinical observations, patient history, and epidemiological information. The expected result is Negative. Fact Sheet for Patients: SugarRoll.be Fact Sheet for Healthcare Providers: https://www.woods-mathews.com/ This test is not yet approved or cleared by the [place] FDA and  has been authorized for detection and/or diagnosis of SARS-CoV-2 by FDA under an Emergency Use Authorization (EUA). This EUA will remain  in effect (meaning this test can be used) for the duration of the COVID-19 declaration under Section 56 4(b)(1) of the Act, 21 U.S.C. section 360bbb-3(b)(1), unless the authorization is terminated or revoked sooner. Performed at [facility], [place] [address] [place], [place] [address]   Urine culture     Status: None   Collection Time: 07/21/19  3:25 PM   Specimen: Urine, Random  Result Value Ref Range Status   Specimen Description   Final    URINE, RANDOM Performed at [facility], [address], [place], [place] [address]    Special Requests   Final    NONE Performed at [facility], [address], [place], [place] [address]    Culture   Final    NO GROWTH Performed at [facility], [place] [address], [place], [place] [address]    Report Status 07/23/2019 FINAL  Final  Blood culture (routine x 2)     Status: None (Preliminary result)   Collection Time: 07/21/19  7:00 PM   Specimen: Right Antecubital; Blood  Result Value Ref Range Status   Specimen Description RIGHT ANTECUBITAL  Final   Special Requests   Final    BOTTLES DRAWN AEROBIC AND ANAEROBIC Blood Culture adequate volume   Culture   Final    NO GROWTH 4 DAYS  Performed at [facility], [address] [place], [place] [address]    Report Status PENDING  Incomplete  Blood culture (routine x 2)     Status: None (Preliminary result)   Collection Time: 07/21/19  7:00 PM   Specimen: Left Antecubital; Blood  Result Value Ref Range Status   Specimen Description LEFT ANTECUBITAL  Final   Special Requests   Final    BOTTLES DRAWN AEROBIC AND ANAEROBIC Blood Culture adequate volume   Culture   Final    NO GROWTH 4 DAYS Performed at [facility], [address] [place], [place] [address]    Report Status PENDING  Incomplete     Labs: BNP (last 3 results) No results for input(s): BNP in the last 8760 hours. Basic Metabolic Panel: Recent Labs  Lab 07/22/19 0853 07/22/19 0853 07/22/19 1621 07/23/19 0526 07/24/19 0537 07/25/19 0745 07/26/19 0752  NA 133*   < > 130* 131* 132* 131* 130*  K 3.7   < > 3.6 3.4* 3.7 3.5 3.7  CL 98   < > 96* 98 96* 97* 96*  CO2 27   < > 24 25 26 25 23   GLUCOSE 101*   < > 171* 92 95 89 81  BUN <5*   < > <5* <5*  5* 5* 7  CREATININE 0.43*   < > 0.44* 0.37* 0.39* <0.30* <0.30*  CALCIUM 7.8*   < > 7.7* 7.7* 8.2* 7.9* 7.7*  MG 1.6*  --   --  2.0 1.9 1.8 1.9  PHOS  --   --   --  2.4*  --   --  3.5   < > = values in this interval not displayed.   Liver Function Tests: Recent Labs  Lab 07/22/19 1621 07/23/19 0526 07/24/19 0537 07/25/19 0745 07/26/19 0752  AST 101* 90* 107* 106* 113*  ALT 38 34 38 35 35  ALKPHOS 267* 224* 261* 199* 182*  BILITOT 11.4* 10.3* 12.5* 12.3* 13.4*  PROT 5.4* 5.0* 6.0* 5.0* 5.1*  ALBUMIN 2.2* 2.0* 2.3* 1.9* 1.8*   Recent Labs  Lab 07/21/19 1457  LIPASE 61*   No results for input(s): AMMONIA in the last 168 hours. CBC: Recent Labs  Lab 07/21/19 1457 07/22/19 0240 07/23/19 0526 07/23/19 1243 07/25/19 0747  WBC 8.8 9.1 8.1  --  11.4*  NEUTROABS 5.4  --  4.7  --   --   HGB 12.4* 10.9* 11.5*  --  11.6*  HCT 35.1* 30.3* 32.6* 35.0* 33.9*  MCV 105.4* 104.8* 106.5*  --  106.6*  PLT 329 314 351  --  448*   Cardiac Enzymes: No results for input(s): CKTOTAL, CKMB, CKMBINDEX, TROPONINI in the last 168 hours. BNP: Invalid input(s): POCBNP CBG: No results for input(s): GLUCAP in the last 168 hours. D-Dimer No results for input(s): DDIMER in the last 72 hours. Hgb A1c No results for input(s): HGBA1C in the last 72 hours. Lipid Profile No results for input(s): CHOL, HDL, LDLCALC, TRIG, CHOLHDL, LDLDIRECT in the last 72 hours. Thyroid function studies No results for input(s): TSH, T4TOTAL, T3FREE, THYROIDAB in the last 72 hours.  Invalid input(s): FREET3 Anemia work up No results for input(s): VITAMINB12, FOLATE, FERRITIN, TIBC, IRON, RETICCTPCT in the last 72 hours. Urinalysis    Component Value Date/Time   COLORURINE AMBER (A) 07/21/2019 1525   APPEARANCEUR HAZY (A) 07/21/2019 1525   LABSPEC 1.025 07/21/2019 1525   PHURINE 6.5 07/21/2019 1525   GLUCOSEU NEGATIVE 07/21/2019 1525   HGBUR NEGATIVE  07/21/2019 1525   BILIRUBINUR LARGE (A) 07/21/2019 1525    KETONESUR 15 (A) 07/21/2019 1525   PROTEINUR 30 (A) 07/21/2019 1525   NITRITE POSITIVE (A) 07/21/2019 1525   LEUKOCYTESUR TRACE (A) 07/21/2019 1525   Sepsis Labs Invalid input(s): PROCALCITONIN,  WBC,  LACTICIDVEN Microbiology Recent Results (from the past 240 hour(s))  SARS CORONAVIRUS 2 (TAT 6-24 HRS) Nasopharyngeal Nasopharyngeal Swab     Status: None   Collection Time: 07/17/19  7:41 AM   Specimen: Nasopharyngeal Swab  Result Value Ref Range Status   SARS Coronavirus 2 NEGATIVE NEGATIVE Final    Comment: (NOTE) SARS-CoV-2 target nucleic acids are NOT DETECTED. The SARS-CoV-2 RNA is generally detectable in upper and lower respiratory specimens during the acute phase of infection. Negative results do not preclude SARS-CoV-2 infection, do not rule out co-infections with other pathogens, and should not be used as the sole basis for treatment or other patient management decisions. Negative results must be combined with clinical observations, patient history, and epidemiological information. The expected result is Negative. Fact Sheet for Patients: SugarRoll.be Fact Sheet for Healthcare Providers: https://www.woods-mathews.com/ This test is not yet approved or cleared by the [place] FDA and  has been authorized for detection and/or diagnosis of SARS-CoV-2 by FDA under an Emergency Use Authorization (EUA). This EUA will remain  in effect (meaning this test can be used) for the duration of the COVID-19 declaration under Section 56 4(b)(1) of the Act, 21 U.S.C. section 360bbb-3(b)(1), unless the authorization is terminated or revoked sooner. Performed at [facility], [place] [address], [place], [place] [address]   Urine culture     Status: None   Collection Time: 07/21/19  3:25 PM   Specimen: Urine, Random  Result Value Ref Range Status   Specimen Description   Final    URINE, RANDOM Performed at [facility], [address], [place], [place] [address]    Special Requests   Final    NONE Performed at [facility], [address] [place], [place] [address]    Culture   Final    NO GROWTH Performed at [facility], [place] [address] [place], [place] [address]    Report Status 07/23/2019 FINAL  Final  Blood culture (routine x 2)     Status: None (Preliminary result)   Collection Time: 07/21/19  7:00 PM   Specimen: Right Antecubital; Blood  Result Value Ref Range Status   Specimen Description RIGHT ANTECUBITAL  Final   Special Requests   Final    BOTTLES DRAWN AEROBIC AND ANAEROBIC Blood Culture adequate volume   Culture   Final    NO GROWTH 4 DAYS Performed at [facility], [address], [place], [place] [address]    Report Status PENDING  Incomplete  Blood culture (routine x 2)     Status: None (Preliminary result)   Collection Time: 07/21/19  7:00 PM   Specimen: Left Antecubital; Blood  Result Value Ref Range Status   Specimen Description LEFT ANTECUBITAL  Final   Special Requests   Final    BOTTLES DRAWN AEROBIC AND ANAEROBIC Blood Culture adequate volume   Culture   Final    NO GROWTH 4 DAYS Performed at [facility], [address] [place], [place] [address]    Report Status PENDING  Incomplete   Time coordinating discharge: 32 minutes   SIGNED:  Irwin Brakeman, MD  [facility] 07/26/2019, 1:04 PM How to contact the [facility] Attending or Consulting provider 7A -  7P or covering provider during after hours [place], for this patient?  Check the care team in [facility] and look for a) attending/consulting [facility] provider listed and b) the [facility] team listed Log into [email or web address] and use [place]'s universal password to access. If you do not have the password, please contact the hospital operator. Locate the [facility] provider you are looking for under [facility] and page to a number that you can be directly reached. If you still have difficulty reaching the provider, please page the [facility]  (Director on Call) for the Hospitalists listed on amion for assistance.

## 2019-07-26 NOTE — Progress Notes (Signed)
Nsg Discharge Note  Admit Date:  07/21/2019 Discharge date: 07/26/2019   [name] to be D/C'd Home per MD order.  AVS completed.  Patient able to verbalize understanding.  Discharge Medication: Allergies as of 07/26/2019   No Known Allergies     Medication List    TAKE these medications   folic acid 1 MG tablet Commonly known as: FOLVITE Take 1 tablet (1 mg total) by mouth daily. Start taking on: July 27, 2019   multivitamin with minerals Tabs tablet Take 1 tablet by mouth daily. Start taking on: July 27, 2019   oxyCODONE 5 MG immediate release tablet Commonly known as: Oxy IR/ROXICODONE Take 1 tablet (5 mg total) by mouth every 6 (six) hours as needed for up to 7 days for severe pain.   polyethylene glycol 17 g packet Commonly known as: MIRALAX / GLYCOLAX Take 17 g by mouth daily as needed for mild constipation.   tamsulosin 0.4 MG Caps capsule Commonly known as: FLOMAX Take 1 capsule (0.4 mg total) by mouth daily after supper.   thiamine 100 MG tablet Take 1 tablet (100 mg total) by mouth daily. Start taking on: July 27, 2019       Discharge Assessment: Vitals:   07/25/19 2115 07/26/19 0500  BP: 113/76 106/67  Pulse: 98 96  Resp: 19 16  Temp: 98.6 F (37 C) 98.2 F (36.8 C)  SpO2: 98% 98%   Skin clean, dry and intact without evidence of skin break down, no evidence of skin tears noted. IV catheter discontinued intact. Site without signs and symptoms of complications - no redness or edema noted at insertion site, patient denies c/o pain - only slight tenderness at site.  Dressing with slight pressure applied.  D/c Instructions-Education: Discharge instructions given to patient with verbalized understanding. D/c education completed with patient including follow up instructions, medication list, d/c activities limitations if indicated, with other d/c instructions as indicated by MD - patient able to verbalize understanding, all questions fully  answered. Patient instructed to return to ED, call 911, or call MD for any changes in condition.  Patient escorted via [place], and D/C home via private auto.  Lillie Bollig Loletha Grayer, RN 07/26/2019 1:18 PM

## 2019-07-27 ENCOUNTER — Telehealth: Payer: Self-pay | Admitting: Gastroenterology

## 2019-07-27 ENCOUNTER — Ambulatory Visit: Payer: 59 | Admitting: Family Medicine

## 2019-07-27 DIAGNOSIS — K769 Liver disease, unspecified: Secondary | ICD-10-CM

## 2019-07-27 NOTE — Telephone Encounter (Signed)
PT NEEDS:    1. CHECK CMP, DIRECT BILI ON THUR APR 22.    2. REFER TO [facility] Dx: ETOH LIVER DISEASE, MELD SCORE 24.

## 2019-07-28 ENCOUNTER — Other Ambulatory Visit: Payer: Self-pay

## 2019-07-28 ENCOUNTER — Telehealth: Payer: Self-pay

## 2019-07-28 DIAGNOSIS — R17 Unspecified jaundice: Secondary | ICD-10-CM

## 2019-07-28 DIAGNOSIS — Z79899 Other long term (current) drug therapy: Secondary | ICD-10-CM

## 2019-07-28 NOTE — Telephone Encounter (Signed)
Per RMR- Send letter to patient.  Send copy of letter with path to referring provider and PCP.  I see pt is in the hospital. Will need close interval follow-up in the office - to be arranged

## 2019-07-28 NOTE — Telephone Encounter (Signed)
Referral faxed to [facility] in [place].

## 2019-07-28 NOTE — Telephone Encounter (Signed)
Noted. Lab orders placed and released. Pt is aware that he needs labs placed.

## 2019-07-28 NOTE — Addendum Note (Signed)
Addended by: Cheron Every on: 07/28/2019 08:09 AM   Modules accepted: Orders

## 2019-07-29 ENCOUNTER — Encounter (HOSPITAL_COMMUNITY): Payer: Self-pay

## 2019-07-29 ENCOUNTER — Other Ambulatory Visit: Payer: Self-pay

## 2019-07-29 ENCOUNTER — Encounter: Payer: Self-pay | Admitting: Family Medicine

## 2019-07-29 ENCOUNTER — Ambulatory Visit: Payer: 59 | Admitting: Family Medicine

## 2019-07-29 ENCOUNTER — Ambulatory Visit: Payer: 59

## 2019-07-29 ENCOUNTER — Inpatient Hospital Stay (HOSPITAL_COMMUNITY)
Admission: EM | Admit: 2019-07-29 | Discharge: 2019-08-06 | DRG: 432 | Disposition: A | Discharge status: Home / Self Care | Payer: 59 | Attending: Family Medicine | Admitting: Family Medicine

## 2019-07-29 VITALS — BP 128/64 | HR 82 | Temp 98.2°F | Resp 16 | Ht 69.0 in | Wt 195.0 lb

## 2019-07-29 DIAGNOSIS — K7010 Alcoholic hepatitis without ascites: Secondary | ICD-10-CM | POA: Diagnosis present

## 2019-07-29 DIAGNOSIS — Z86010 Personal history of colonic polyps: Secondary | ICD-10-CM

## 2019-07-29 DIAGNOSIS — K7581 Nonalcoholic steatohepatitis (NASH): Secondary | ICD-10-CM | POA: Diagnosis not present

## 2019-07-29 DIAGNOSIS — E871 Hypo-osmolality and hyponatremia: Secondary | ICD-10-CM | POA: Diagnosis not present

## 2019-07-29 DIAGNOSIS — D638 Anemia in other chronic diseases classified elsewhere: Secondary | ICD-10-CM | POA: Diagnosis not present

## 2019-07-29 DIAGNOSIS — R600 Localized edema: Secondary | ICD-10-CM

## 2019-07-29 DIAGNOSIS — K7400 Hepatic fibrosis, unspecified: Secondary | ICD-10-CM | POA: Diagnosis present

## 2019-07-29 DIAGNOSIS — B179 Acute viral hepatitis, unspecified: Secondary | ICD-10-CM

## 2019-07-29 DIAGNOSIS — R3911 Hesitancy of micturition: Secondary | ICD-10-CM

## 2019-07-29 DIAGNOSIS — R1011 Right upper quadrant pain: Secondary | ICD-10-CM | POA: Diagnosis not present

## 2019-07-29 DIAGNOSIS — Z825 Family history of asthma and other chronic lower respiratory diseases: Secondary | ICD-10-CM

## 2019-07-29 DIAGNOSIS — D696 Thrombocytopenia, unspecified: Secondary | ICD-10-CM | POA: Diagnosis not present

## 2019-07-29 DIAGNOSIS — D689 Coagulation defect, unspecified: Secondary | ICD-10-CM | POA: Diagnosis not present

## 2019-07-29 DIAGNOSIS — E876 Hypokalemia: Secondary | ICD-10-CM | POA: Diagnosis present

## 2019-07-29 DIAGNOSIS — R188 Other ascites: Secondary | ICD-10-CM | POA: Diagnosis not present

## 2019-07-29 DIAGNOSIS — Z79899 Other long term (current) drug therapy: Secondary | ICD-10-CM | POA: Diagnosis not present

## 2019-07-29 DIAGNOSIS — Z8719 Personal history of other diseases of the digestive system: Secondary | ICD-10-CM

## 2019-07-29 DIAGNOSIS — F17210 Nicotine dependence, cigarettes, uncomplicated: Secondary | ICD-10-CM | POA: Diagnosis present

## 2019-07-29 DIAGNOSIS — N320 Bladder-neck obstruction: Secondary | ICD-10-CM

## 2019-07-29 DIAGNOSIS — L409 Psoriasis, unspecified: Secondary | ICD-10-CM | POA: Diagnosis present

## 2019-07-29 DIAGNOSIS — K7011 Alcoholic hepatitis with ascites: Secondary | ICD-10-CM | POA: Diagnosis not present

## 2019-07-29 DIAGNOSIS — R609 Edema, unspecified: Secondary | ICD-10-CM | POA: Diagnosis not present

## 2019-07-29 DIAGNOSIS — R531 Weakness: Secondary | ICD-10-CM | POA: Diagnosis not present

## 2019-07-29 DIAGNOSIS — Z20822 Contact with and (suspected) exposure to covid-19: Secondary | ICD-10-CM | POA: Diagnosis present

## 2019-07-29 DIAGNOSIS — K7200 Acute and subacute hepatic failure without coma: Secondary | ICD-10-CM | POA: Diagnosis not present

## 2019-07-29 DIAGNOSIS — F1020 Alcohol dependence, uncomplicated: Secondary | ICD-10-CM | POA: Diagnosis present

## 2019-07-29 DIAGNOSIS — Z808 Family history of malignant neoplasm of other organs or systems: Secondary | ICD-10-CM

## 2019-07-29 DIAGNOSIS — K7402 Hepatic fibrosis, advanced fibrosis: Secondary | ICD-10-CM | POA: Diagnosis not present

## 2019-07-29 DIAGNOSIS — R195 Other fecal abnormalities: Secondary | ICD-10-CM | POA: Diagnosis not present

## 2019-07-29 DIAGNOSIS — R17 Unspecified jaundice: Secondary | ICD-10-CM | POA: Diagnosis present

## 2019-07-29 DIAGNOSIS — E8809 Other disorders of plasma-protein metabolism, not elsewhere classified: Secondary | ICD-10-CM | POA: Diagnosis present

## 2019-07-29 DIAGNOSIS — R109 Unspecified abdominal pain: Secondary | ICD-10-CM

## 2019-07-29 DIAGNOSIS — N139 Obstructive and reflux uropathy, unspecified: Secondary | ICD-10-CM | POA: Diagnosis present

## 2019-07-29 DIAGNOSIS — K7030 Alcoholic cirrhosis of liver without ascites: Secondary | ICD-10-CM

## 2019-07-29 DIAGNOSIS — R5383 Other fatigue: Secondary | ICD-10-CM | POA: Diagnosis not present

## 2019-07-29 DIAGNOSIS — R69 Illness, unspecified: Secondary | ICD-10-CM | POA: Diagnosis not present

## 2019-07-29 DIAGNOSIS — I10 Essential (primary) hypertension: Secondary | ICD-10-CM | POA: Diagnosis not present

## 2019-07-29 HISTORY — DX: Unspecified jaundice: R17

## 2019-07-29 HISTORY — DX: Anemia, unspecified: D64.9

## 2019-07-29 HISTORY — DX: Unspecified abdominal pain: R10.9

## 2019-07-29 HISTORY — DX: Essential (primary) hypertension: I10

## 2019-07-29 HISTORY — DX: Alcoholic hepatitis without ascites: K70.10

## 2019-07-29 LAB — COMPREHENSIVE METABOLIC PANEL
ALT: 40 U/L (ref 0–44)
AST: 143 U/L — ABNORMAL HIGH (ref 15–41)
Albumin: 1.9 g/dL — ABNORMAL LOW (ref 3.5–5.0)
Alkaline Phosphatase: 228 U/L — ABNORMAL HIGH (ref 38–126)
Anion gap: 11 (ref 5–15)
BUN: 5 mg/dL — ABNORMAL LOW (ref 6–20)
CO2: 26 mmol/L (ref 22–32)
Calcium: 8.2 mg/dL — ABNORMAL LOW (ref 8.9–10.3)
Chloride: 93 mmol/L — ABNORMAL LOW (ref 98–111)
Creatinine, Ser: 0.74 mg/dL (ref 0.61–1.24)
GFR calc Af Amer: >60 mL/min (ref >60)
GFR calc non Af Amer: >60 mL/min (ref >60)
Glucose, Bld: 104 mg/dL — ABNORMAL HIGH (ref 70–99)
Potassium: 3.5 mmol/L (ref 3.5–5.1)
Sodium: 130 mmol/L — ABNORMAL LOW (ref 135–145)
Total Bilirubin: 19 mg/dL (ref 0.3–1.2)
Total Protein: 6 g/dL — ABNORMAL LOW (ref 6.5–8.1)

## 2019-07-29 LAB — CBC
HCT: 35.9 % — ABNORMAL LOW (ref 39.0–52.0)
Hemoglobin: 12.4 g/dL — ABNORMAL LOW (ref 13.0–17.0)
MCH: 36.8 pg — ABNORMAL HIGH (ref 26.0–34.0)
MCHC: 34.5 g/dL (ref 30.0–36.0)
MCV: 106.5 fL — ABNORMAL HIGH (ref 80.0–100.0)
Platelets: 590 10*3/uL — ABNORMAL HIGH (ref 150–400)
RBC: 3.37 MIL/uL — ABNORMAL LOW (ref 4.22–5.81)
RDW: 16.6 % — ABNORMAL HIGH (ref 11.5–15.5)
WBC: 14.9 10*3/uL — ABNORMAL HIGH (ref 4.0–10.5)
nRBC: 0 % (ref 0.0–0.2)

## 2019-07-29 LAB — PROTIME-INR
INR: 1.3 — ABNORMAL HIGH (ref 0.8–1.2)
Prothrombin Time: 16.4 seconds — ABNORMAL HIGH (ref 11.4–15.2)

## 2019-07-29 LAB — LACTATE DEHYDROGENASE: LDH: 180 U/L (ref 98–192)

## 2019-07-29 LAB — LIPASE, BLOOD: Lipase: 72 U/L — ABNORMAL HIGH (ref 11–51)

## 2019-07-29 MED ORDER — FOLIC ACID 1 MG PO TABS
1.0000 mg | ORAL_TABLET | Freq: Every day | ORAL | Status: DC
  Administered 2019-07-29 – 2019-08-06 (×9): 1 mg via ORAL
  Filled 2019-07-29 (×9): qty 1

## 2019-07-29 MED ORDER — ADULT MULTIVITAMIN W/MINERALS CH
1.0000 | ORAL_TABLET | Freq: Every day | ORAL | Status: DC
  Administered 2019-07-29 – 2019-08-06 (×9): 1 via ORAL
  Filled 2019-07-29 (×9): qty 1

## 2019-07-29 MED ORDER — OXYCODONE HCL 5 MG PO TABS
5.0000 mg | ORAL_TABLET | Freq: Four times a day (QID) | ORAL | Status: DC | PRN
  Administered 2019-07-30 – 2019-08-06 (×22): 5 mg via ORAL
  Filled 2019-07-29 (×22): qty 1

## 2019-07-29 MED ORDER — THIAMINE HCL 100 MG PO TABS
100.0000 mg | ORAL_TABLET | Freq: Every day | ORAL | Status: DC
  Administered 2019-07-29 – 2019-08-06 (×9): 100 mg via ORAL
  Filled 2019-07-29 (×9): qty 1

## 2019-07-29 MED ORDER — ENOXAPARIN SODIUM 40 MG/0.4ML ~~LOC~~ SOLN
40.0000 mg | SUBCUTANEOUS | Status: AC
  Administered 2019-07-29 – 2019-08-01 (×4): 40 mg via SUBCUTANEOUS
  Filled 2019-07-29 (×4): qty 0.4

## 2019-07-29 MED ORDER — TAMSULOSIN HCL 0.4 MG PO CAPS
0.4000 mg | ORAL_CAPSULE | Freq: Every day | ORAL | Status: DC
  Administered 2019-07-30 – 2019-08-05 (×7): 0.4 mg via ORAL
  Filled 2019-07-29 (×7): qty 1

## 2019-07-29 MED ORDER — POLYETHYLENE GLYCOL 3350 17 G PO PACK: 17.0000 g | PACK | Freq: Every day | ORAL | Status: DC | PRN

## 2019-07-29 MED ORDER — MORPHINE SULFATE (PF) 2 MG/ML IV SOLN
1.0000 mg | INTRAVENOUS | Status: DC | PRN
  Administered 2019-07-29 – 2019-07-30 (×2): 1 mg via INTRAVENOUS
  Filled 2019-07-29 (×2): qty 1

## 2019-07-29 NOTE — Patient Instructions (Signed)
Go directly to ER

## 2019-07-29 NOTE — ED Triage Notes (Signed)
Pt c.o abd pain for 2 weeks, sent here by doctor due to bilateral leg swelling, hx of liver failure. Pt denies n/v

## 2019-07-29 NOTE — Assessment & Plan Note (Signed)
Patient with elevated bilirubin prominent jaundice which is progressive.  He now has peripheral edema in the setting of bladder outlet obstruction and acute hepatitis.  I called his gastroenterologist at [facility] and were discussed his case with them including his current examination and the worsening symptoms.  There is concern for hepatorenal syndrome in the setting of the outlet obstruction and now with peripheral edema.  It is best to treat him in the hospital.  I discussed this with patient as well as his wife Javari Bufkin who he gave me permission to do so.  They would like to have him seen at [facility] which I think is reasonable in the setting of the need for urology as well he has been referred to [facility] urology.  They are going to take him to [facility].  Gastroenterology can consult with [facility] as needed.

## 2019-07-29 NOTE — Progress Notes (Signed)
Subjective:    Patient ID: Christopher Carrillo, male    DOB: October 01, 1970, 49 y.o.   MRN: [id number]  Patient presents for Hospital F/U  Patient here for hospital follow-up.At her last visit I sent him back to the hospital secondary to scleral icterus and abdominal distention concerning for worsening hepatitis.  He was admitted to the hospital for further evaluation and treatment. Hospital admission and discharge reviewed in detail along with labs/medications  He had repeat CT of abdomen which showed severe fatty liver infiltration and ultrasound showed hepatomegaly.  He had edema in the right colon as well concerning for ischemic colitis. He was treated with IV Zosyn until his symptoms resolved.  Regarding his hepatitis this was thought to be alcohol induced versu propofol induced hepatitis he was evaluated by gastroenterology and outpatient GI follow-up has been scheduled with a liver specialist with [facility] in [place] indicates that his liver function is not improving he may need liver transplant.Marland Kitchen  He is remains off of EtOH.  He is continued on folic acid and thiamine supplements.   Anemia of chronic disease hemoglobin was stable  Regarding  urinary symptoms he had difficulty urinating despite how much she was drinking.  He is found to have urinary outlet obstruction.  He was started on Flomax daily and output improved in the hospital.  Referral was made to urology as an outpatient for further work-up.  Hypomagnesium he was given IV magnesium this returned to normal.  of the colon mass noted he had colonoscopy and biopsy done there was no cancer found     Renal function normal Cr 0.30 BUN 7    Today he states that he has been feeling much more fatigued and weak since he left the hospital on Sunday.  He is now swollen in LE which came up over the past 24 to 36 hours.  He was unable to even bend his legs yesterday because of the tightness and the swelling which was up to his knees.   He did try to elevate his feet but only helped a little bit.  He is taking the Flomax but he still not urinating very much.  He is also more jaundiced down to his abdomen now.  He does not feel like eating just feels sluggish he is stumbling around his home he states feels like his legs are just very weak.  Only taking the medications prescribed to him from the hospital. His weight is up 16 pounds since his visit with me on April 13.  I did review the notes from the hospital but I cannot find any other weights noted Review Of Systems:  GEN- denies fatigue, fever, weight loss,+weakness, recent illness HEENT- denies eye drainage, change in vision, nasal discharge, CVS- denies chest pain, palpitations RESP- denies SOB, cough, wheeze ABD- denies N/V, change in stools, abd pain GU- denies dysuria, hematuria, dribbling, incontinence MSK- + joint pain, muscle aches, injury Neuro- denies headache, dizziness, syncope, seizure activity       Objective:    BP 128/64   Pulse 82   Temp 98.2 F (36.8 C) (Temporal)   Resp 16   Ht 5' 9"  (1.753 m)   Wt 195 lb (88.5 kg)   SpO2 98%   BMI 28.80 kg/m  GEN- NAD, alert and oriented x3, fatigued appearing HEENT- PERRL, EOMI, scleral icetrus, pink conjunctiva, MMM, oropharynx clear Neck- Supple, no thyromegaly CVS- RRR, no murmur RESP-CTAB ABD-NABS,soft,NT, distended Skin- jaundice from face to abdomen Psych depressed affect EXT-2+  pitting  edema Pulses- Radial  2+        Assessment & Plan:      Problem List Items Addressed This Visit      Unprioritized   Elevated bilirubin    Patient with elevated bilirubin prominent jaundice which is progressive.  He now has peripheral edema in the setting of bladder outlet obstruction and acute hepatitis.  I called his gastroenterologist at [place] and were discussed his case with them including his current examination and the worsening symptoms.  There is concern for hepatorenal syndrome in the  setting of the outlet obstruction and now with peripheral edema.  It is best to treat him in the hospital.  I discussed this with patient as well as his wife Christopher Carrillo who he gave me permission to do so.  They would like to have him seen at [facility] which I think is reasonable in the setting of the need for urology as well he has been referred to [facility] urology.  They are going to take him to [facility].  Gastroenterology can consult with [facility] as needed.        Other Visit Diagnoses    Acute hepatitis    -  Primary  Alcohol induced versus peripheral followed drug-induced or possible combination.  He has been off of EtOH for almost a month now.  His bilirubin did not worsen until after his colonoscopy for probable cecal mass which came to be this was just edema he also had polyps removed but they were not cancerous.     Jaundice       Peripheral edema       Bladder outlet obstruction          Note: This dictation was prepared with Dragon dictation along with smaller phrase technology. Any transcriptional errors that result from this process are unintentional.

## 2019-07-29 NOTE — ED Provider Notes (Signed)
[place] EMERGENCY DEPARTMENT Provider Note   CSN: [id number] Arrival date & time: 07/29/19  1414     History Chief Complaint  Patient presents with  . Abdominal Pain  . Leg Swelling    Christopher Carrillo is a 49 y.o. male.  HPI   Patient presents with concern of swelling, jaundice, fatigue. Patient was hospitalized by 1 week ago after he had worsening of this constellation of symptoms, and now presents due to persistency, as well as new bilateral lower extremity swelling. Patient has a history of liver dysfunction, diagnosed recently, as well as abnormal colonoscopy, also performed recently. After discharge he notes that he was never back to normal, and now over the last 2 or 3 days has developed swelling in both lower extremities, symmetrically, primarily in the dorsum of his feet. There is associated fatigue, dyspnea, generalized weakness, but no focal weakness, no confusion, disorientation, vomiting, fever, chills. Patient is a former drinker. He notes that after discharge he was referred to GI, and a transplant center, but has not had a chance to have those appointments. History is obtained by the patient and chart review, including discharge summary, and primary care notes from the past 2 weeks.   Past Medical History:  Diagnosis Date  . Psoriasis   . Transaminitis     Patient Active Problem List   Diagnosis Date Noted  . Drug-induced hepatic toxicity 07/25/2019  . RUQ pain   . Elevated bilirubin   . Alcoholic hepatitis without ascites   . Elevated liver enzymes 07/21/2019  . Elevated LFTs 07/14/2019  . Colonic mass 07/14/2019  . MDD (major depressive disorder) 05/25/2019  . Hepatic steatosis 02/18/2019  . GAD (generalized anxiety disorder) 02/18/2019  . Atypical chest pain 04/08/2013  . GERD (gastroesophageal reflux disease) 04/08/2013  . Rectal leakage 04/08/2013  . Hyperlipidemia 12/19/2012  . Essential hypertension, benign 12/19/2012  .  Routine general medical examination at a health care facility 08/07/2012  . Psoriasis 05/04/2012  . Obese 05/04/2012  . Tobacco use 05/04/2012    Past Surgical History:  Procedure Laterality Date  . BIOPSY  07/20/2019   Procedure: BIOPSY;  Surgeon: Daneil Dolin, MD;  Location: [facility];  Service: Endoscopy;;  . COLONOSCOPY WITH PROPOFOL N/A 07/20/2019   Procedure: COLONOSCOPY WITH PROPOFOL;  Surgeon: Daneil Dolin, MD;  Location: [facility];  Service: Endoscopy;  Laterality: N/A;  10:15am  . NO PAST SURGERIES    . POLYPECTOMY  07/20/2019   Procedure: POLYPECTOMY;  Surgeon: Daneil Dolin, MD;  Location: [facility];  Service: Endoscopy;;       Family History  Problem Relation Age of Onset  . Asthma Father   . Cancer Sister 18       Pharyngeal  . Colon cancer Neg Hx   . Liver disease Neg Hx     Social History   Tobacco Use  . Smoking status: Current Every Day Smoker    Packs/day: 0.33    Types: Cigarettes    Last attempt to quit: 03/20/2016    Years since quitting: 3.3  . Smokeless tobacco: Never Used  Substance Use Topics  . Alcohol use: Not Currently    Comment: 3-4 beers/night  . Drug use: No    Home Medications Prior to Admission medications   Medication Sig Start Date End Date Taking? Authorizing Provider  folic acid (FOLVITE) 1 MG tablet Take 1 tablet (1 mg total) by mouth daily. 07/27/19  Yes Johnson, Clanford L,  MD  Hyprom-Naphaz-Polysorb-Zn Sulf (CLEAR EYES COMPLETE OP) Place 1 drop into both eyes daily as needed (for dry eyes).   Yes [provider]  Multiple Vitamin (MULTIVITAMIN WITH MINERALS) TABS tablet Take 1 tablet by mouth daily. 07/27/19  Yes Johnson, Clanford L, MD  oxyCODONE (OXY IR/ROXICODONE) 5 MG immediate release tablet Take 1 tablet (5 mg total) by mouth every 6 (six) hours as needed for up to 7 days for severe pain. 07/26/19 08/02/19 Yes Johnson, Clanford L, MD  polyethylene glycol (MIRALAX / GLYCOLAX) 17 g packet Take 17 g  by mouth daily as needed for mild constipation. 07/26/19  Yes Johnson, Clanford L, MD  tamsulosin (FLOMAX) 0.4 MG CAPS capsule Take 1 capsule (0.4 mg total) by mouth daily after supper. 07/26/19  Yes Johnson, Clanford L, MD  thiamine 100 MG tablet Take 1 tablet (100 mg total) by mouth daily. 07/27/19  Yes Murlean Iba, MD    Allergies    Patient has no known allergies.  Review of Systems   Review of Systems  Constitutional:       Per HPI, otherwise negative  HENT:       Per HPI, otherwise negative  Respiratory:       Per HPI, otherwise negative  Cardiovascular:       Per HPI, otherwise negative  Gastrointestinal: Positive for abdominal pain and nausea. Negative for vomiting.  Endocrine:       Negative aside from HPI  Genitourinary:       Neg aside from HPI   Musculoskeletal:       Per HPI, otherwise negative  Skin: Positive for color change.  Neurological: Positive for weakness. Negative for syncope.    Physical Exam Updated Vital Signs BP 112/71 (BP Location: Right Arm)   Pulse 95   Temp 99.1 F (37.3 C) (Oral)   Resp 18   Ht 5' 9"  (1.753 m)   Wt 88.5 kg   SpO2 100%   BMI 28.80 kg/m   Physical Exam Vitals and nursing note reviewed.  Constitutional:      Appearance: He is ill-appearing.     Comments: Unwell appearing adult male awake and alert, grossly jaundiced.  HENT:     Head: Normocephalic and atraumatic.  Eyes:     General: Scleral icterus present.     Conjunctiva/sclera: Conjunctivae normal.  Cardiovascular:     Rate and Rhythm: Normal rate and regular rhythm.  Pulmonary:     Effort: Pulmonary effort is normal. No respiratory distress.     Breath sounds: No stridor.  Abdominal:     General: There is no distension.     Tenderness: There is abdominal tenderness.     Comments: Liver margin appreciable almost 4 inches below the costal margin.  Skin:    General: Skin is warm and dry.     Coloration: Skin is jaundiced.  Neurological:     Mental  Status: He is alert and oriented to person, place, and time.      ED Results / Procedures / Treatments   Labs (all labs ordered are listed, but only abnormal results are displayed) Labs Reviewed  LIPASE, BLOOD - Abnormal; Notable for the following components:      Result Value   Lipase 72 (*)    All other components within normal limits  COMPREHENSIVE METABOLIC PANEL - Abnormal; Notable for the following components:   Sodium 130 (*)    Chloride 93 (*)    Glucose, Bld 104 (*)  BUN 5 (*)    Calcium 8.2 (*)    Total Protein 6.0 (*)    Albumin 1.9 (*)    AST 143 (*)    Alkaline Phosphatase 228 (*)    Total Bilirubin 19.0 (*)    All other components within normal limits  CBC - Abnormal; Notable for the following components:   WBC 14.9 (*)    RBC 3.37 (*)    Hemoglobin 12.4 (*)    HCT 35.9 (*)    MCV 106.5 (*)    MCH 36.8 (*)    RDW 16.6 (*)    Platelets 590 (*)    All other components within normal limits  SARS CORONAVIRUS 2 (TAT 6-24 HRS)  URINALYSIS, ROUTINE W REFLEX MICROSCOPIC  PROTIME-INR  LACTATE DEHYDROGENASE     Procedures Procedures (including critical care time)  Medications Ordered in ED Medications - No data to display  ED Course  I have reviewed the triage vital signs and the nursing notes.  Pertinent labs & imaging results that were available during my care of the patient were reviewed by me and considered in my medical decision making (see chart for details). Patient's chart reviewed after initial evaluation notable for conversations today with his primary care physician due to worsening jaundice, and recent GI evaluation including cecal mass identified on colonoscopy. Patient also noted to have a meld score of 26, and has had referral made to local transplant center.   7:31 PM Abs consistent with worsening hepatic function with a bilirubin of 19, worsening enzymes, and with leukocytosis, concern for progression of disease, I discussed his case  with our gastroenterology colleagues. Given these concerns, and the patient's known meld score of 26, patient will require admission. GI will follow as a consulting team for consideration of additional interventions, such as steroids, in the morning.  This adult male with prior alcohol abuse presents with worsening hepatomegaly, fatigue, jaundice, is found to have worsening liver failure. Patient is awake and alert, but given his constellation of abnormalities, after discussion with our gastroenterology colleagues, internal medicine colleagues, he was admitted for further interventions, monitoring, management.  On admission the Covid test is pending.  MDM Number of Diagnoses or Management Options   Amount and/or Complexity of Data Reviewed Clinical lab tests: reviewed Tests in the medicine section of CPT: reviewed Decide to obtain previous medical records or to obtain history from someone other than the patient: yes Review and summarize past medical records: yes Discuss the patient with other providers: yes  Risk of Complications, Morbidity, and/or Mortality Presenting problems: high Diagnostic procedures: high Management options: high   Final Clinical Impression(s) / ED Diagnoses Final diagnoses:  Acute liver failure without hepatic coma      Carmin Muskrat, MD 07/29/19 1933

## 2019-07-29 NOTE — H&P (Signed)
History and Physical    Christopher Carrillo WIO:[id number] DOB: 1971/03/06 DOA: 07/29/2019  PCP: Alycia Rossetti, MD  Patient coming from: Home, wife at bedside  I have personally briefly reviewed patient's old medical records in [place]  Chief Complaint: Worsening jaundice and lower extremity edema  HPI: Christopher Carrillo is a 49 y.o. male with medical history significant for Hx of alcoholic hepatitis, psoriasis, hypertension, hyperlipidemia, anxiety and depression who presents due to worsening jaundice and new lower extremity edema.   Patient was recently admitted from 4/13-4/18 and was treated for colitis and was found to have drug induced hepatitis and urinary obstruction. He was discharged with bilirubin of 12 with outpatient follow up with GI. He was then referral to [facility] in [place] due to MELD score of 24.  He was following [facility] previously for colonic mass and recently underwent biopsy during colonoscopy on 3/31 that shows benign mucosa and thought to be due to intermittent ileocecal intussusception. There was questions as to whether liver injury was caused by propofol from recent colonoscopy.  He was referred to urology outpatient for urinary obstruction.  Patient does not have those outpatient appointments until later.   However since his discharge he has becoming more jaundice and the last two days he has had increase LE edema. Increase confusion in being more forgetful and losing his keys. Per wife his speech sounds more slurred. Continues to be fatigued and has been sleeping all day. Has persistent right sided abdominal pain. Also notes burning sensation with urinating and with bowel movements. Has been dribbling and spaying when urinating. Denies any fever. Has decrease appetite. Saw PCP today and was told to go to ED for further evaluation.  Has previous heavy alcohol use of "a few beers and shots per day" for least 10 years.  Has not had an  alcoholic drink for the past month.  Endorse smoking 1 pack every 3 days.  Denies illicit drug use.  Temperature of 99.1, HR of 101, BP 112/71. WBC of 14.9, hemoglobin of 12.4, platelet of 590. Na of 130. AST of 143, ALT of 40. Total bilirubin of 19. Lipase of 72. Creatinine of 0.74.    Review of Systems:  Constitutional: No Weight Change, No Fever ENT/Mouth: No sore throat, No Rhinorrhea Eyes: No Eye Pain, No Vision Changes Cardiovascular: No Chest Pain, no SOB, +Edema Respiratory: No Cough, No Sputum, No Wheezing, no Dyspnea  Gastrointestinal: No Nausea, No Vomiting, No Diarrhea, No Constipation, + Pain Genitourinary: no Urinary Incontinence, No Urgency, No Flank Pain Musculoskeletal: No Arthralgias, No Myalgias Skin: No Skin Lesions, No Pruritus, Neuro: no Weakness, No Numbness,   Psych: No Anxiety/Panic, No Depression,+ decrease appetite Heme/Lymph: No Bruising, No Bleeding  Past Medical History:  Diagnosis Date  . Psoriasis   . Transaminitis     Past Surgical History:  Procedure Laterality Date  . BIOPSY  07/20/2019   Procedure: BIOPSY;  Surgeon: Daneil Dolin, MD;  [facility];  Service: Endoscopy;;  . COLONOSCOPY WITH PROPOFOL N/A 07/20/2019   Procedure: COLONOSCOPY WITH PROPOFOL;  Surgeon: Daneil Dolin, MD;  [facility];  Service: Endoscopy;  Laterality: N/A;  10:15am  . NO PAST SURGERIES    . POLYPECTOMY  07/20/2019   Procedure: POLYPECTOMY;  Surgeon: Daneil Dolin, MD;  [facility];  Service: Endoscopy;;     reports that he has been smoking cigarettes. He has been smoking about 0.33 packs per day. He has  never used smokeless tobacco. He reports previous alcohol use. He reports that he does not use drugs.  No Known Allergies  Family History  Problem Relation Age of Onset  . Asthma Father   . Cancer Sister 18       Pharyngeal  . Colon cancer Neg Hx   . Liver disease Neg Hx      Prior to Admission medications     Medication Sig Start Date End Date Taking? Authorizing Provider  folic acid (FOLVITE) 1 MG tablet Take 1 tablet (1 mg total) by mouth daily. 07/27/19  Yes Johnson, Clanford L, MD  Hyprom-Naphaz-Polysorb-Zn Sulf (CLEAR EYES COMPLETE OP) Place 1 drop into both eyes daily as needed (for dry eyes).   Yes [provider]  Multiple Vitamin (MULTIVITAMIN WITH MINERALS) TABS tablet Take 1 tablet by mouth daily. 07/27/19  Yes Johnson, Clanford L, MD  oxyCODONE (OXY IR/ROXICODONE) 5 MG immediate release tablet Take 1 tablet (5 mg total) by mouth every 6 (six) hours as needed for up to 7 days for severe pain. 07/26/19 08/02/19 Yes Johnson, Clanford L, MD  polyethylene glycol (MIRALAX / GLYCOLAX) 17 g packet Take 17 g by mouth daily as needed for mild constipation. 07/26/19  Yes Johnson, Clanford L, MD  tamsulosin (FLOMAX) 0.4 MG CAPS capsule Take 1 capsule (0.4 mg total) by mouth daily after supper. 07/26/19  Yes Johnson, Clanford L, MD  thiamine 100 MG tablet Take 1 tablet (100 mg total) by mouth daily. 07/27/19  Yes Murlean Iba, MD    Physical Exam: Vitals:   07/29/19 1417 07/29/19 1749  BP: 101/67 112/71  Pulse: (!) 101 95  Resp: 16 18  Temp: 99.1 F (37.3 C)   TempSrc: Oral   SpO2: 98% 100%  Weight: 88.5 kg   Height: 5' 9"  (1.753 m)     Constitutional: NAD, calm, comfortable, mildly ill appearing male with generalized jaundice and sclera icterus Vitals:   07/29/19 1417 07/29/19 1749  BP: 101/67 112/71  Pulse: (!) 101 95  Resp: 16 18  Temp: 99.1 F (37.3 C)   TempSrc: Oral   SpO2: 98% 100%  Weight: 88.5 kg   Height: 5' 9"  (1.753 m)    Eyes: PERRL, sclera icterus  ENMT: Mucous membranes are moist. Posterior pharynx clear of any exudate or lesions.Normal dentition.  Neck: normal, supple,  Respiratory: clear to auscultation bilaterally, no wheezing, no crackles. Normal respiratory effort. No accessory muscle use.  Cardiovascular: Regular rate and rhythm, no murmurs / rubs /  gallops. +3 pitting extremity edema up to bilateral pre-tibial region. 2+ pedal pulses. No carotid bruits.  Abdomen: Significant abdominal distention, no rebound tenderness, no guarding or rigidity, no masses palpated. Hepatosplenomegaly. No fluid wave. Bowel sounds positive.  Musculoskeletal: no clubbing / cyanosis. No joint deformity upper and lower extremities. Good ROM, no contractures. Normal muscle tone.  Skin:  Telangiectasia of the anterior chest Neurologic: CN 2-12 grossly intact. Sensation intact. Strength 5/5 in all 4. No asterixis. Psychiatric: Normal judgment and insight. Alert and oriented x 3. Normal mood.     Labs on Admission: I have personally reviewed following labs and imaging studies  CBC: Recent Labs  Lab 07/23/19 0526 07/23/19 1243 07/25/19 0747 07/29/19 1409  WBC 8.1  --  11.4* 14.9*  NEUTROABS 4.7  --   --   --   HGB 11.5*  --  11.6* 12.4*  HCT 32.6* 35.0* 33.9* 35.9*  MCV 106.5*  --  106.6* 106.5*  PLT 351  --  448* 237*   Basic Metabolic Panel: Recent Labs  Lab 07/23/19 0526 07/24/19 0537 07/25/19 0745 07/26/19 0752 07/29/19 1409  NA 131* 132* 131* 130* 130*  K 3.4* 3.7 3.5 3.7 3.5  CL 98 96* 97* 96* 93*  CO2 25 26 25 23 26   GLUCOSE 92 95 89 81 104*  BUN <5* 5* 5* 7 5*  CREATININE 0.37* 0.39* <0.30* <0.30* 0.74  CALCIUM 7.7* 8.2* 7.9* 7.7* 8.2*  MG 2.0 1.9 1.8 1.9  --   PHOS 2.4*  --   --  3.5  --    GFR: Estimated Creatinine Clearance: 125.6 mL/min (by C-G formula based on SCr of 0.74 mg/dL). Liver Function Tests: Recent Labs  Lab 07/23/19 0526 07/24/19 0537 07/25/19 0745 07/26/19 0752 07/29/19 1409  AST 90* 107* 106* 113* 143*  ALT 34 38 35 35 40  ALKPHOS 224* 261* 199* 182* 228*  BILITOT 10.3* 12.5* 12.3* 13.4* 19.0*  PROT 5.0* 6.0* 5.0* 5.1* 6.0*  ALBUMIN 2.0* 2.3* 1.9* 1.8* 1.9*   Recent Labs  Lab 07/29/19 1409  LIPASE 72*   No results for input(s): AMMONIA in the last 168 hours. Coagulation Profile: Recent Labs  Lab  07/23/19 0526 07/25/19 0745 07/29/19 1840  INR 1.2 1.2 1.3*   Cardiac Enzymes: No results for input(s): CKTOTAL, CKMB, CKMBINDEX, TROPONINI in the last 168 hours. BNP (last 3 results) No results for input(s): PROBNP in the last 8760 hours. HbA1C: No results for input(s): HGBA1C in the last 72 hours. CBG: No results for input(s): GLUCAP in the last 168 hours. Lipid Profile: No results for input(s): CHOL, HDL, LDLCALC, TRIG, CHOLHDL, LDLDIRECT in the last 72 hours. Thyroid Function Tests: No results for input(s): TSH, T4TOTAL, FREET4, T3FREE, THYROIDAB in the last 72 hours. Anemia Panel: No results for input(s): VITAMINB12, FOLATE, FERRITIN, TIBC, IRON, RETICCTPCT in the last 72 hours. Urine analysis:    Component Value Date/Time   COLORURINE AMBER (A) 07/21/2019 1525   APPEARANCEUR HAZY (A) 07/21/2019 1525   LABSPEC 1.025 07/21/2019 1525   PHURINE 6.5 07/21/2019 1525   GLUCOSEU NEGATIVE 07/21/2019 1525   HGBUR NEGATIVE 07/21/2019 1525   BILIRUBINUR LARGE (A) 07/21/2019 1525   KETONESUR 15 (A) 07/21/2019 1525   PROTEINUR 30 (A) 07/21/2019 1525   NITRITE POSITIVE (A) 07/21/2019 1525   LEUKOCYTESUR TRACE (A) 07/21/2019 1525    Radiological Exams on Admission: No results found.    Assessment/Plan  Severe alcoholic hepatitis/worsening bilirubin GI thinks he has hallmarks of alcoholic hepatitis in the past but has had extensive negative studies. He was referred to transplant center in [place] and awaiting appointment. GI to assess in the morning for possible prednisolone   LE edema Likely due to worsening liver disease and hypoalbuminemia  Appreciate GI recommends for potential steroids  Right sided abdominal pain Overall benign exam  Could be due to hepatomegaly  Has hx of ileoceal intussusception   Continue PRN pain management   Urinary hesitancy Could be due to increase abdominal distention Bladder scan showed around 400cc. Normal creatinine Continue  Flomax Continue with outpatient urology follow up   Thrombocytopenia  Could be reactive to liver disease  Monitor   Anemia of chronic disease Stable   Hypertension Stable   Chronic alcoholism Reports no alcohol for the past month Continue vitamin, folic and thiamine   Status is: Inpatient  DVT prophylaxis:.Lovenox Code Status: Full Family Communication: Plan discussed with patient and wife at bedside   Dispo: The patient is from: Home  Anticipated d/c is to: Home              Anticipated d/c date is: 3 days              Patient currently medically stable but requires GI consultation and close monitoring of continuous elevating bilirubin with likely severe alcohol hepatitis        Christopher Carrillo Jovaughn Wojtaszek DO [facility]   If 7PM-7AM, please contact night-coverage [email or web address]   07/29/2019, 8:37 PM

## 2019-07-30 ENCOUNTER — Ambulatory Visit: Payer: 59 | Admitting: Nurse Practitioner

## 2019-07-30 ENCOUNTER — Encounter (HOSPITAL_COMMUNITY): Payer: Self-pay | Admitting: Family Medicine

## 2019-07-30 DIAGNOSIS — K7010 Alcoholic hepatitis without ascites: Secondary | ICD-10-CM | POA: Diagnosis not present

## 2019-07-30 LAB — COMPREHENSIVE METABOLIC PANEL
ALT: 30 U/L (ref 0–44)
AST: 115 U/L — ABNORMAL HIGH (ref 15–41)
Albumin: 1.5 g/dL — ABNORMAL LOW (ref 3.5–5.0)
Alkaline Phosphatase: 173 U/L — ABNORMAL HIGH (ref 38–126)
Anion gap: 10 (ref 5–15)
BUN: 5 mg/dL — ABNORMAL LOW (ref 6–20)
CO2: 26 mmol/L (ref 22–32)
Calcium: 7.8 mg/dL — ABNORMAL LOW (ref 8.9–10.3)
Chloride: 93 mmol/L — ABNORMAL LOW (ref 98–111)
Creatinine, Ser: 0.76 mg/dL (ref 0.61–1.24)
GFR calc Af Amer: >60 mL/min (ref >60)
GFR calc non Af Amer: >60 mL/min (ref >60)
Glucose, Bld: 99 mg/dL (ref 70–99)
Potassium: 3.4 mmol/L — ABNORMAL LOW (ref 3.5–5.1)
Sodium: 129 mmol/L — ABNORMAL LOW (ref 135–145)
Total Bilirubin: 16 mg/dL — ABNORMAL HIGH (ref 0.3–1.2)
Total Protein: 4.9 g/dL — ABNORMAL LOW (ref 6.5–8.1)

## 2019-07-30 LAB — CBC
HCT: 32 % — ABNORMAL LOW (ref 39.0–52.0)
Hemoglobin: 11.3 g/dL — ABNORMAL LOW (ref 13.0–17.0)
MCH: 36.6 pg — ABNORMAL HIGH (ref 26.0–34.0)
MCHC: 35.3 g/dL (ref 30.0–36.0)
MCV: 103.6 fL — ABNORMAL HIGH (ref 80.0–100.0)
Platelets: 488 10*3/uL — ABNORMAL HIGH (ref 150–400)
RBC: 3.09 MIL/uL — ABNORMAL LOW (ref 4.22–5.81)
RDW: 16.3 % — ABNORMAL HIGH (ref 11.5–15.5)
WBC: 13.9 10*3/uL — ABNORMAL HIGH (ref 4.0–10.5)
nRBC: 0 % (ref 0.0–0.2)

## 2019-07-30 LAB — URINALYSIS, ROUTINE W REFLEX MICROSCOPIC
Glucose, UA: NEGATIVE mg/dL
Hgb urine dipstick: NEGATIVE
Ketones, ur: NEGATIVE mg/dL
Leukocytes,Ua: NEGATIVE
Nitrite: NEGATIVE
Protein, ur: NEGATIVE mg/dL
Specific Gravity, Urine: 1.01 (ref 1.005–1.030)
pH: 6 (ref 5.0–8.0)

## 2019-07-30 LAB — AMMONIA: Ammonia: 39 umol/L — ABNORMAL HIGH (ref 9–35)

## 2019-07-30 LAB — SARS CORONAVIRUS 2 (TAT 6-24 HRS): SARS Coronavirus 2: NEGATIVE

## 2019-07-30 MED ORDER — ALBUMIN HUMAN 25 % IV SOLN
25.0000 g | Freq: Three times a day (TID) | INTRAVENOUS | Status: DC
  Administered 2019-07-31 – 2019-08-06 (×20): 25 g via INTRAVENOUS
  Filled 2019-07-30 (×22): qty 100

## 2019-07-30 MED ORDER — ZINC SULFATE 220 (50 ZN) MG PO CAPS
220.0000 mg | ORAL_CAPSULE | Freq: Every day | ORAL | Status: DC
  Administered 2019-07-31 – 2019-08-06 (×7): 220 mg via ORAL
  Filled 2019-07-30 (×7): qty 1

## 2019-07-30 MED ORDER — LACTULOSE 10 GM/15ML PO SOLN
10.0000 g | Freq: Two times a day (BID) | ORAL | Status: DC
  Administered 2019-07-31 – 2019-08-06 (×11): 10 g via ORAL
  Filled 2019-07-30 (×12): qty 15

## 2019-07-30 MED ORDER — VITAMIN K1 10 MG/ML IJ SOLN
10.0000 mg | Freq: Every day | INTRAMUSCULAR | Status: AC
  Administered 2019-07-31 – 2019-08-02 (×3): 10 mg via SUBCUTANEOUS
  Filled 2019-07-30 (×4): qty 1

## 2019-07-30 MED ORDER — OXYCODONE HCL 5 MG PO TABS
5.0000 mg | ORAL_TABLET | Freq: Once | ORAL | Status: AC
  Administered 2019-07-30: 5 mg via ORAL
  Filled 2019-07-30: qty 1

## 2019-07-30 NOTE — Consult Note (Addendum)
[facility] Consult: 8:22 AM 07/30/2019  LOS: 1 day    Referring Provider: Dr Eliseo Squires  Primary Care Physician:  Alycia Rossetti, MD Primary Gastroenterologist:  Dr Garfield Cornea.      Reason for Consultation:  Alcoholic hepatitis.     HPI: Kamren Heintzelman is a 49 y.o. male.  PMH hypertension.  Hyperlipidemia.  Psoriasis.  Alcoholism.  Anxiety, depression.  Polycythemia.  Thrombocytopenia.  Platelets 123 in mid 05/2019. Abnormal LFTs and fatty liver date back to 06/2017  Patient developed right upper quadrant pain.  07/08/2019 CT abdomen pelvis showed cecal mass at IC valve and hepatic steatosis. Referred to GI and underwent 07/19/1929 colonoscopy for eval of abnormal CT and FOBT positive stool.  Revealed mass at IC valve and descending colon polyp.  The mass biopsy showed benign mucosa, polyp was a tubular adenoma without HGD. Within 24 hours of colonoscopy his urine was dark, right upper quadrant pain persistent, malaise/fatigue. 4/13 -07/26/2019 admission to [facility] with abdominal pain, alcoholic/? Drug induced hepatitis, jaundice.  Meld 24.  Fatty liver,  right colon edema on CT  T bili 12.5.  Alk phos 261.  AST/ALT 107/38.  INR 1.2.  Ferritin 2953.  ANA negative.  Mitochondrial antibody IgG negative.  Hepatitis serologies nonreactive.  Normal platelets.  No APAP level obtained but patient had not been using Tylenol. 07/21/2019 abdominal ultrasound showed fatty liver, hepatomegaly. 07/21/2019 CT abdomen pelvis showed severe fatty liver, normal bile ducts.  Right colon edema.  Previous cecal changes possibly representing intussusception had resolved. Dr. Oneida Alar made [facility].  Patient has not had a chance to arrange follow-up date  Patient's has a history of heavy alcohol use of  "a few beers" and shots of liquor on a daily basis.  He had been drinking up until the time of the colonoscopy.  Smokes a pack of cigarettes every 3 days.  Since discharge ongoing abdominal pain, fatigue and onset lower extremity edema and there was concern for AKI.  Since 12/2018 patient dropped 48 pounds.  He weighed 179 pounds at discharge last weekend and yesterday weight 195 pounds.  Urine is dark.  Patient will feel the urge to urinate but only passes a small amount of urine, no dysuria.  Stools are brown but lately have been more greenish colored because he has been eating a lot of greens.  Family wanted patient [facility] hospital where he could have urology services.  T bili 19.  Alk phos 228.  AST/ALT 143/40.  These have improved today.  Lipase 72. Sodium 129.  BUN, creatinine normal.  Potassium 3.4. 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  Smokes a pack of cigarettes over 3 days..  Family history negative for liver disease, alcoholism, colorectal disease.  Past Medical History:  Diagnosis Date  . Psoriasis   . Transaminitis     Past Surgical History:  Procedure Laterality Date  .  BIOPSY  07/20/2019   Procedure: BIOPSY;  Surgeon: Daneil Dolin, MD;  [facility];  Service: Endoscopy;;  . COLONOSCOPY WITH PROPOFOL N/A 07/20/2019   Procedure: COLONOSCOPY WITH PROPOFOL;  Surgeon: Daneil Dolin, MD;  [facility];  Service: Endoscopy;  Laterality: N/A;  10:15am  . NO PAST SURGERIES    . POLYPECTOMY  07/20/2019   Procedure: POLYPECTOMY;  Surgeon: Daneil Dolin, MD;  [facility];  Service: Endoscopy;;    Prior to Admission medications   Medication Sig Start Date End Date Taking? Authorizing Provider  folic acid (FOLVITE) 1 MG tablet Take 1 tablet (1 mg total) by mouth daily. 07/27/19  Yes Johnson, Clanford L, MD  Hyprom-Naphaz-Polysorb-Zn Sulf  (CLEAR EYES COMPLETE OP) Place 1 drop into both eyes daily as needed (for dry eyes).   Yes [provider]  Multiple Vitamin (MULTIVITAMIN WITH MINERALS) TABS tablet Take 1 tablet by mouth daily. 07/27/19  Yes Johnson, Clanford L, MD  oxyCODONE (OXY IR/ROXICODONE) 5 MG immediate release tablet Take 1 tablet (5 mg total) by mouth every 6 (six) hours as needed for up to 7 days for severe pain. 07/26/19 08/02/19 Yes Johnson, Clanford L, MD  polyethylene glycol (MIRALAX / GLYCOLAX) 17 g packet Take 17 g by mouth daily as needed for mild constipation. 07/26/19  Yes Johnson, Clanford L, MD  tamsulosin (FLOMAX) 0.4 MG CAPS capsule Take 1 capsule (0.4 mg total) by mouth daily after supper. 07/26/19  Yes Johnson, Clanford L, MD  thiamine 100 MG tablet Take 1 tablet (100 mg total) by mouth daily. 07/27/19  Yes Johnson, Clanford L, MD    Scheduled Meds: . enoxaparin (LOVENOX) injection  40 mg Subcutaneous Q24H  . folic acid  1 mg Oral Daily  . multivitamin with minerals  1 tablet Oral Daily  . tamsulosin  0.4 mg Oral QPC supper  . thiamine  100 mg Oral Daily   Infusions:  PRN Meds: morphine injection, oxyCODONE, polyethylene glycol   Allergies as of 07/29/2019  . (No Known Allergies)    Family History  Problem Relation Age of Onset  . Asthma Father   . Cancer Sister 18       Pharyngeal  . Colon cancer Neg Hx   . Liver disease Neg Hx     Social History   Socioeconomic History  . Marital status: Married    Spouse name: Not on file  . Number of children: Not on file  . Years of education: Not on file  . Highest education level: Not on file  Occupational History  . Not on file  Tobacco Use  . Smoking status: Current Every Day Smoker    Packs/day: 0.33    Types: Cigarettes    Last attempt to quit: 03/20/2016    Years since quitting: 3.3  . Smokeless tobacco: Never Used  Substance and Sexual Activity  . Alcohol use: Not Currently    Comment: 3-4 beers/night  . Drug use: No  .  Sexual activity: Yes  Other Topics Concern  . Not on file  Social History Narrative  . Not on file   Social Determinants of Health   Financial Resource Strain:   . Difficulty of Paying Living Expenses:   Food Insecurity:   . Worried About Charity fundraiser in the Last Year:   . Arboriculturist in the Last Year:   Transportation Needs:   . Film/video editor (Medical):   Marland Kitchen Lack  of Transportation (Non-Medical):   Physical Activity:   . Days of Exercise per Week:   . Minutes of Exercise per Session:   Stress:   . Feeling of Stress :   Social Connections:   . Frequency of Communication with Friends and Family:   . Frequency of Social Gatherings with Friends and Family:   . Attends Religious Services:   . Active Member of Clubs or Organizations:   . Attends Archivist Meetings:   Marland Kitchen Marital Status:   Intimate Partner Violence:   . Fear of Current or Ex-Partner:   . Emotionally Abused:   Marland Kitchen Physically Abused:   . Sexually Abused:     REVIEW OF SYSTEMS: Constitutional: Fatigue, malaise, weakness. ENT:  No nose bleeds Pulm: No cough, no shortness of breath CV:  No palpitations, no LE edema.  No chest pain. GU: See HPI. GI: See HPI. Heme: No unusual bleeding or bruising. Transfusions: No previous transfusions. Neuro:  No headaches, no peripheral tingling or numbness.  No syncope, no seizures. Derm: Jaundice.  No itching, no rash or sores.  Endocrine:  No sweats or chills.  No polyuria or dysuria Immunization: Vaccinated for COVID-19 with Materna vaccine on 06/26/2019.   PHYSICAL EXAM: Vital signs in last 24 hours: Vitals:   07/30/19 0402 07/30/19 0811  BP: 112/71 (!) 91/53  Pulse: 88 91  Resp: 16 17  Temp: 97.9 F (36.6 C) 98.6 F (37 C)  SpO2: 98% 98%   Wt Readings from Last 3 Encounters:  07/29/19 88.5 kg  07/29/19 88.5 kg  07/21/19 81.2 kg    General: Jaundiced, pleasant, alert.  Comfortable. Head: No facial asymmetry or swelling.  No signs  of head trauma. Eyes: No scleral icterus, no conjunctival pallor. Ears: Not hard of hearing Nose: No discharge or congestion Mouth: Oropharynx moist, pink, clear. Neck: No JVD, no masses, no thyromegaly. Lungs: No labored breathing or cough.  Lungs clear with good breath sounds bilaterally Heart: RRR. Abdomen: Slightly protuberant but soft.  Minimal tenderness in right upper quadrant.  No guarding or rebound.  Bowel sounds active.  No bruits, hernias..   Rectal: Deferred. Musc/Skeltl: No joint deformity or swelling. Extremities: 1-2+ edema up the top of his feet into the ankles.  Feet are warm. Neurologic: Oriented x3.  No tremors or asterixis.  Moves all 4 with grossly normal strength.  Good historian. Skin: Jaundiced.  No telangiectasia, rashes or suspicious lesions. Nodes: No cervical adenopathy Psych: Cooperative, calm, pleasant.  Intake/Output from previous day: 04/21 0701 - 04/22 0700 In: -  Out: 600 [Urine:600] Intake/Output this shift: Total I/O In: -  Out: 380 [Urine:380]  LAB RESULTS: Recent Labs    07/29/19 1409 07/30/19 0415  WBC 14.9* 13.9*  HGB 12.4* 11.3*  HCT 35.9* 32.0*  PLT 590* 488*   BMET Lab Results  Component Value Date   NA 129 (L) 07/30/2019   NA 130 (L) 07/29/2019   NA 130 (L) 07/26/2019   K 3.4 (L) 07/30/2019   K 3.5 07/29/2019   K 3.7 07/26/2019   CL 93 (L) 07/30/2019   CL 93 (L) 07/29/2019   CL 96 (L) 07/26/2019   CO2 26 07/30/2019   CO2 26 07/29/2019   CO2 23 07/26/2019   GLUCOSE 99 07/30/2019   GLUCOSE 104 (H) 07/29/2019   GLUCOSE 81 07/26/2019   BUN 5 (L) 07/30/2019   BUN 5 (L) 07/29/2019   BUN 7 07/26/2019   CREATININE 0.76 07/30/2019   CREATININE 0.74 07/29/2019  CREATININE <0.30 (L) 07/26/2019   CALCIUM 7.8 (L) 07/30/2019   CALCIUM 8.2 (L) 07/29/2019   CALCIUM 7.7 (L) 07/26/2019   LFT Recent Labs    07/29/19 1409 07/30/19 0415  PROT 6.0* 4.9*  ALBUMIN 1.9* 1.5*  AST 143* 115*  ALT 40 30  ALKPHOS 228* 173*   BILITOT 19.0* 16.0*   PT/INR Lab Results  Component Value Date   INR 1.3 (H) 07/29/2019   INR 1.2 07/25/2019   INR 1.2 07/23/2019   Hepatitis Panel No results for input(s): HEPBSAG, HCVAB, HEPAIGM, HEPBIGM in the last 72 hours. C-Diff No components found for: CDIFF Lipase     Component Value Date/Time   LIPASE 72 (H) 07/29/2019 1409    Drugs of Abuse     Component Value Date/Time   LABOPIA NONE DETECTED 07/08/2019 1805   COCAINSCRNUR NONE DETECTED 07/08/2019 1805   LABBENZ NONE DETECTED 07/08/2019 1805   AMPHETMU NONE DETECTED 07/08/2019 1805   THCU NONE DETECTED 07/08/2019 1805   LABBARB NONE DETECTED 07/08/2019 1805     RADIOLOGY STUDIES: No results found.    IMPRESSION:   *   Acute hepatitis from ETOH and/or Dili.  What had been a background of mildly elevated LFTs and fatty liver became acutely worse within 24 hours of colonoscopy and propofol sedation exposure. Suspect the patient's hepatitis/hepatomegaly is the source of his right upper quadrant pain. MELD 20.  Disc fx score 34.6  *   Abnormal CT abdomen pelvis.  FOBT positive stool. 07/08/19 colonoscopy: bx of IC mass (path benign), 5 mm descending polyp (TA) removed.    *   Anemia chronic dz.    *   Urinary retention, Bladder outlet obstruction?Marland Kitchen  No renal insufficiency. Bladder scan w 800 cc, then spontaneously urinated 600 cc dark stool, another 300 of spontaneous output this morning. No foley in place.     PLAN:     *   CTAP ordered  *    Asked patient's nurse to repeat bladder scan to assess level of urinary retention.  *    Supportive care.  Watch Daily c-Met and PT/INR  *    ? Prednisolone? ? Acetylcysteine?  Addendum at 933 this morning Bladder just scanned and has 123 mL retained urine  Azucena Freed  07/30/2019, 8:22 AM Phone [phone]   Attending physician's note   I have taken an interval history, reviewed the chart and examined the patient. I agree with the Advanced  Practitioner's note, impression and recommendations.   Acute (likely ETOH) hepatitis on chronic liver disease. AST:ALT ratio c/w ETOH. Neg acute hep panel, ANA, ASMA, Nl cerruloplasmin, elevated ferritin but Nl iron studies. [place] 4/13- fatty liver with Nl doppler, no ascites, CT- severe fatty liver.  MELD Na: 25  DF: 21.5 (steroids not indicated)  Hyponatremia, hypoalbuminemia with marked cholestasis (TB 16)  Subclinical encephalopathy  Covid-19 neg  Recent colon 07/20/2019- ileocolonic intussusception, small colonic polyp s/p polypectomy (TA), pancolonic diverticulosis.   Plan: -2g Na regular protein diet. -Strictly no more alcohol. -IV albumin 25g IV Q8 hrs -Lactulose 30 cc po bid. Titrate to 2-3 BMs/day -US guided paracentesis 4/23 (r/o SBP) -Check HBsAb titer and HAV total Ab.  If neg, would recommend vaccination for A and B. -Trend CBC, CMP, PT INR. -Would add diuretics if US shows ascites or LL edema worens. -Thiamine/folic acid/zinc. -Avoid hepatotoxic meds. -Will follow along. -D/W pt and pt's wife.   Carmell Austria, MD Velora Heckler Fabienne Bruns [phone].

## 2019-07-30 NOTE — Progress Notes (Signed)
Progress Note    Christopher Carrillo  BMW:[id number] DOB: November 22, 1970  DOA: 07/29/2019 PCP: Alycia Rossetti, MD    Brief Narrative:     Medical records reviewed and are as summarized below:  Christopher Carrillo is an 49 y.o. male with medical history significant for Hx of alcoholic hepatitis, psoriasis, hypertension, hyperlipidemia, anxiety and depression who presents due to worsening jaundice, fatigue, confusion and new lower extremity edema.   Patient was recently admitted from 4/13-4/18 and was treated for colitis and was found to have drug induced hepatitis and urinary obstruction. He was discharged with bilirubin of 12 with outpatient follow up with GI. He was then referral to [facility] in [place] due to MELD score of 24.  He was following [facility] previously for colonic mass and recently underwent biopsy during colonoscopy on 3/31 that shows benign mucosa and thought to be due to intermittent ileocecal intussusception.   Assessment/Plan:   Active Problems:   Essential hypertension   Abdominal pain   Elevated bilirubin   Alcoholic hepatitis without ascites   Lower extremity edema   Urinary hesitancy   Thrombocytopenia (HCC)   Anemia of chronic disease   Severe alcoholic hepatitis/worsening bilirubin/fatigue/confusion GI thinks he has hallmarks of alcoholic hepatitis in the past but has had extensive negative studies. He was referred to transplant center in [place] and awaiting appointment. GI consult appreciated Ammonia only mildly elevated Blood pressure lower end of normal   LE edema Likely due to worsening liver disease and hypoalbuminemia  Encourage patient to elevate legs  Right sided abdominal pain Overall benign exam  Could be due to hepatomegaly  Has hx of ileoceal intussusception   Continue PRN pain management   Urinary hesitancy Could be due to increase abdominal distention Bladder scan showed around 400cc. Normal  creatinine Continue Flomax Continue with outpatient urology follow up   Patient does not have thrombocytopenia  Hyponatremia -Monitor -May need fluid restriction  Anemia of chronic disease Stable   Hypertension Stable   Chronic alcoholism Reports no alcohol for the past month Continue vitamin, folic and thiamine     Family Communication/Anticipated D/C date and plan/Code Status   DVT prophylaxis: Lovenox ordered. Code Status: Full Code.  Family Communication: Wife at bedside Disposition Plan: Status is: Inpatient  Remains inpatient appropriate because:Inpatient level of care appropriate due to severity of illness   Dispo: The patient is from: Home              Anticipated d/c is to: Home              Anticipated d/c date is: 3 days              Patient currently is not medically stable to d/c.          Medical Consultants:    None.   Anti-Infectives:    None  Subjective:   Patient states lower extremity edema is slightly better since yesterday  Objective:    Vitals:   07/29/19 2238 07/29/19 2340 07/30/19 0402 07/30/19 0811  BP: (!) 100/57 101/60 112/71 (!) 91/53  Pulse: 88 87 88 91  Resp: 18 16 16 17   Temp:  97.8 F (36.6 C) 97.9 F (36.6 C) 98.6 F (37 C)  TempSrc:  Oral Oral Oral  SpO2: 97% 98% 98% 98%  Weight:      Height:        Intake/Output Summary (Last 24 hours) at 07/30/2019 2725 Last data filed at 07/30/2019 [phone]  Gross per 24 hour  Intake --  Output 980 ml  Net -980 ml   Filed Weights   07/29/19 1417  Weight: 88.5 kg    Exam: Patient sitting in bed, jaundiced Regular rate and rhythm Positive pitting lower extremity edema bilateral Pleasant and cooperative No increased work of breathing Mildly distended abdomen  Data Reviewed:   I have personally reviewed following labs and imaging studies:  Labs: Labs show the following:   Basic Metabolic Panel: Recent Labs  Lab 07/24/19 0537 07/24/19 0537  07/25/19 0745 07/25/19 0745 07/26/19 0752 07/26/19 0752 07/29/19 1409 07/30/19 0415  NA 132*  --  131*  --  130*  --  130* 129*  K 3.7   < > 3.5   < > 3.7   < > 3.5 3.4*  CL 96*  --  97*  --  96*  --  93* 93*  CO2 26  --  25  --  23  --  26 26  GLUCOSE 95  --  89  --  81  --  104* 99  BUN 5*  --  5*  --  7  --  5* 5*  CREATININE 0.39*  --  <0.30*  --  <0.30*  --  0.74 0.76  CALCIUM 8.2*  --  7.9*  --  7.7*  --  8.2* 7.8*  MG 1.9  --  1.8  --  1.9  --   --   --   PHOS  --   --   --   --  3.5  --   --   --    < > = values in this interval not displayed.   GFR Estimated Creatinine Clearance: 125.6 mL/min (by C-G formula based on SCr of 0.76 mg/dL). Liver Function Tests: Recent Labs  Lab 07/24/19 0537 07/25/19 0745 07/26/19 0752 07/29/19 1409 07/30/19 0415  AST 107* 106* 113* 143* 115*  ALT 38 35 35 40 30  ALKPHOS 261* 199* 182* 228* 173*  BILITOT 12.5* 12.3* 13.4* 19.0* 16.0*  PROT 6.0* 5.0* 5.1* 6.0* 4.9*  ALBUMIN 2.3* 1.9* 1.8* 1.9* 1.5*   Recent Labs  Lab 07/29/19 1409  LIPASE 72*   No results for input(s): AMMONIA in the last 168 hours. Coagulation profile Recent Labs  Lab 07/25/19 0745 07/29/19 1840  INR 1.2 1.3*    CBC: Recent Labs  Lab 07/23/19 1243 07/25/19 0747 07/29/19 1409 07/30/19 0415  WBC  --  11.4* 14.9* 13.9*  HGB  --  11.6* 12.4* 11.3*  HCT 35.0* 33.9* 35.9* 32.0*  MCV  --  106.6* 106.5* 103.6*  PLT  --  448* 590* 488*   Cardiac Enzymes: No results for input(s): CKTOTAL, CKMB, CKMBINDEX, TROPONINI in the last 168 hours. BNP (last 3 results) No results for input(s): PROBNP in the last 8760 hours. CBG: No results for input(s): GLUCAP in the last 168 hours. D-Dimer: No results for input(s): DDIMER in the last 72 hours. Hgb A1c: No results for input(s): HGBA1C in the last 72 hours. Lipid Profile: No results for input(s): CHOL, HDL, LDLCALC, TRIG, CHOLHDL, LDLDIRECT in the last 72 hours. Thyroid function studies: No results for  input(s): TSH, T4TOTAL, T3FREE, THYROIDAB in the last 72 hours.  Invalid input(s): FREET3 Anemia work up: No results for input(s): VITAMINB12, FOLATE, FERRITIN, TIBC, IRON, RETICCTPCT in the last 72 hours. Sepsis Labs: Recent Labs  Lab 07/25/19 0747 07/29/19 1409 07/30/19 0415  WBC 11.4* 14.9* 13.9*    Microbiology Recent Results (  from the past 240 hour(s))  Urine culture     Status: None   Collection Time: 07/21/19  3:25 PM   Specimen: Urine, Random  Result Value Ref Range Status   Specimen Description   Final    URINE, RANDOM Performed at [facility], [address], [place], [place] [address]    Special Requests   Final    NONE Performed at [facility], [address], [place], [place] [address]    Culture   Final    NO GROWTH Performed at [facility], [place] [address], [place], [place] [address]    Report Status 07/23/2019 FINAL  Final  Blood culture (routine x 2)     Status: None   Collection Time: 07/21/19  7:00 PM   Specimen: Right Antecubital; Blood  Result Value Ref Range Status   Specimen Description RIGHT ANTECUBITAL  Final   Special Requests   Final    BOTTLES DRAWN AEROBIC AND ANAEROBIC Blood Culture adequate volume   Culture   Final    NO GROWTH 5 DAYS Performed at [facility], [address] [place], [place] [address]    Report Status 07/26/2019 FINAL  Final  Blood culture (routine x 2)     Status: None   Collection Time: 07/21/19  7:00 PM   Specimen: Left Antecubital; Blood  Result Value Ref Range Status   Specimen Description LEFT ANTECUBITAL  Final   Special Requests   Final    BOTTLES DRAWN AEROBIC AND ANAEROBIC Blood Culture adequate volume   Culture   Final    NO GROWTH 5 DAYS Performed at [facility], [address] [place], [place] [address]    Report Status 07/26/2019 FINAL  Final  SARS CORONAVIRUS 2 (TAT 6-24 HRS) Nasopharyngeal Nasopharyngeal Swab     Status: None   Collection Time: 07/29/19  7:33 PM   Specimen:  Nasopharyngeal Swab  Result Value Ref Range Status   SARS Coronavirus 2 NEGATIVE NEGATIVE Final    Comment: (NOTE) SARS-CoV-2 target nucleic acids are NOT DETECTED. The SARS-CoV-2 RNA is generally detectable in upper and lower respiratory specimens during the acute phase of infection. Negative results do not preclude SARS-CoV-2 infection, do not rule out co-infections with other pathogens, and should not be used as the sole basis for treatment or other patient management decisions. Negative results must be combined with clinical observations, patient history, and epidemiological information. The expected result is Negative. Fact Sheet for Patients: SugarRoll.be Fact Sheet for Healthcare Providers: https://www.woods-mathews.com/ This test is not yet approved or cleared by the [place] FDA and  has been authorized for detection and/or diagnosis of SARS-CoV-2 by FDA under an Emergency Use Authorization (EUA). This EUA will remain  in effect (meaning this test can be used) for the duration of the COVID-19 declaration under Section 56 4(b)(1) of the Act, 21 U.S.C. section 360bbb-3(b)(1), unless the authorization is terminated or revoked sooner. Performed at [facility], [place] [address] [place], [place] [address]     Procedures and diagnostic studies:  No results found.  Medications:   . enoxaparin (LOVENOX) injection  40 mg Subcutaneous Q24H  . folic acid  1 mg Oral Daily  . multivitamin with minerals  1 tablet Oral Daily  . tamsulosin  0.4 mg Oral QPC supper  . thiamine  100 mg Oral Daily   Continuous Infusions:   LOS: 1 day   Geradine Girt  [facility]   How to contact the [facility]  Attending or Consulting provider [place] or covering provider during after hours [place], for this patient?  1. Check the care team in [facility] and look for a) attending/consulting [facility] provider listed and b) the [facility] team listed 2. Log into  [email or web address] and use [place] universal password to access. If you do not have the password, please contact the hospital operator. 3. Locate the [facility] provider you are looking for under [facility] and page to a number that you can be directly reached. 4. If you still have difficulty reaching the provider, please page the [facility] (Director on Call) for the Hospitalists listed on amion for assistance.  07/30/2019, 8:52 AM

## 2019-07-30 NOTE — Plan of Care (Signed)
  Problem: Health Behavior/Discharge Planning: Goal: Ability to manage health-related needs will improve Outcome: Progressing   Problem: Clinical Measurements: Goal: Ability to maintain clinical measurements within normal limits will improve Outcome: Progressing   Problem: Nutrition: Goal: Adequate nutrition will be maintained Outcome: Progressing   Problem: Coping: Goal: Level of anxiety will decrease Outcome: Progressing   Problem: Elimination: Goal: Will not experience complications related to bowel motility Outcome: Progressing   Problem: Pain Managment: Goal: General experience of comfort will improve Outcome: Progressing

## 2019-07-30 NOTE — Telephone Encounter (Signed)
OV made and letter mailed °

## 2019-07-31 ENCOUNTER — Inpatient Hospital Stay (HOSPITAL_COMMUNITY): Payer: 59

## 2019-07-31 DIAGNOSIS — K7200 Acute and subacute hepatic failure without coma: Secondary | ICD-10-CM

## 2019-07-31 LAB — CBC
HCT: 29.3 % — ABNORMAL LOW (ref 39.0–52.0)
Hemoglobin: 10.2 g/dL — ABNORMAL LOW (ref 13.0–17.0)
MCH: 36.4 pg — ABNORMAL HIGH (ref 26.0–34.0)
MCHC: 34.8 g/dL (ref 30.0–36.0)
MCV: 104.6 fL — ABNORMAL HIGH (ref 80.0–100.0)
Platelets: 474 10*3/uL — ABNORMAL HIGH (ref 150–400)
RBC: 2.8 MIL/uL — ABNORMAL LOW (ref 4.22–5.81)
RDW: 16 % — ABNORMAL HIGH (ref 11.5–15.5)
WBC: 16.9 10*3/uL — ABNORMAL HIGH (ref 4.0–10.5)
nRBC: 0.1 % (ref 0.0–0.2)

## 2019-07-31 LAB — COMPREHENSIVE METABOLIC PANEL
ALT: 35 U/L (ref 0–44)
AST: 105 U/L — ABNORMAL HIGH (ref 15–41)
Albumin: 1.8 g/dL — ABNORMAL LOW (ref 3.5–5.0)
Alkaline Phosphatase: 141 U/L — ABNORMAL HIGH (ref 38–126)
Anion gap: 12 (ref 5–15)
BUN: 7 mg/dL (ref 6–20)
CO2: 22 mmol/L (ref 22–32)
Calcium: 7.6 mg/dL — ABNORMAL LOW (ref 8.9–10.3)
Chloride: 92 mmol/L — ABNORMAL LOW (ref 98–111)
Creatinine, Ser: 0.89 mg/dL (ref 0.61–1.24)
GFR calc Af Amer: >60 mL/min (ref >60)
GFR calc non Af Amer: >60 mL/min (ref >60)
Glucose, Bld: 101 mg/dL — ABNORMAL HIGH (ref 70–99)
Potassium: 3.4 mmol/L — ABNORMAL LOW (ref 3.5–5.1)
Sodium: 126 mmol/L — ABNORMAL LOW (ref 135–145)
Total Bilirubin: 18 mg/dL — ABNORMAL HIGH (ref 0.3–1.2)
Total Protein: 4.9 g/dL — ABNORMAL LOW (ref 6.5–8.1)

## 2019-07-31 LAB — PROTIME-INR
INR: 1.5 — ABNORMAL HIGH (ref 0.8–1.2)
Prothrombin Time: 17.6 seconds — ABNORMAL HIGH (ref 11.4–15.2)

## 2019-07-31 LAB — GAMMA GT: GGT: 236 U/L — ABNORMAL HIGH (ref 7–50)

## 2019-07-31 NOTE — Progress Notes (Addendum)
Daily Rounding Note  07/31/2019, 8:46 AM  LOS: 2 days   SUBJECTIVE:   Chief complaint: Acute hepatitis   RUQ pain continues along with malaise, fatigue. Appetite is good but he cannot eat all that much. No dyspnea.  No nausea vomiting.  Had a couple of bowel movements yesterday.  OBJECTIVE:         Vital signs in last 24 hours:    Temp:  [97.5 F (36.4 C)-98.4 F (36.9 C)] 97.5 F (36.4 C) (04/23 0742) Pulse Rate:  [96-110] 96 (04/23 0742) Resp:  [14-18] 17 (04/23 0742) BP: (101-116)/(59-72) 101/62 (04/23 0742) SpO2:  [93 %-98 %] 97 % (04/23 0742) Last BM Date: 07/30/19 Filed Weights   07/29/19 1417  Weight: 88.5 kg   General: Jaundiced. Heart: RRR. Chest: Clear bilaterally without labored breathing.  No cough. Abdomen: Protuberant, soft.  Tender right upper quadrant and somewhat in the left upper quadrant without guarding or rebound.  Bowel sounds active. Extremities: Edema in the lower legs. Neuro/Psych: Slight tremor/asterixis.  Slow speech, psychomotor retardation but not confused.  Moves all 4 limbs, able to walk unassisted.  Intake/Output from previous day: 04/22 0701 - 04/23 0700 In: 825.6 [P.O.:720; IV Piggyback:105.6] Out: 1948 [Urine:1948]  Intake/Output this shift: No intake/output data recorded.  Lab Results: Recent Labs    07/29/19 1409 07/30/19 0415 07/31/19 0508  WBC 14.9* 13.9* 16.9*  HGB 12.4* 11.3* 10.2*  HCT 35.9* 32.0* 29.3*  PLT 590* 488* 474*   BMET Recent Labs    07/29/19 1409 07/30/19 0415 07/31/19 0508  NA 130* 129* 126*  K 3.5 3.4* 3.4*  CL 93* 93* 92*  CO2 26 26 22   GLUCOSE 104* 99 101*  BUN 5* 5* 7  CREATININE 0.74 0.76 0.89  CALCIUM 8.2* 7.8* 7.6*   LFT Recent Labs    07/29/19 1409 07/30/19 0415 07/31/19 0508  PROT 6.0* 4.9* 4.9*  ALBUMIN 1.9* 1.5* 1.8*  AST 143* 115* 105*  ALT 40 30 35  ALKPHOS 228* 173* 141*  BILITOT 19.0* 16.0* 18.0*    PT/INR Recent Labs    07/29/19 1840 07/31/19 0508  LABPROT 16.4* 17.6*  INR 1.3* 1.5*   Hepatitis Panel No results for input(s): HEPBSAG, HCVAB, HEPAIGM, HEPBIGM in the last 72 hours.  Studies/Results: No results found.   Scheduled Meds: . enoxaparin (LOVENOX) injection  40 mg Subcutaneous Q24H  . folic acid  1 mg Oral Daily  . lactulose  10 g Oral BID  . multivitamin with minerals  1 tablet Oral Daily  . phytonadione  10 mg Subcutaneous Daily  . tamsulosin  0.4 mg Oral QPC supper  . thiamine  100 mg Oral Daily  . zinc sulfate  220 mg Oral Daily   Continuous Infusions: . albumin human 25 g (07/31/19 0508)   PRN Meds:.oxyCODONE, polyethylene glycol   ASSESMENT:   *   Acute hepatitis.  Likely from ETOH, ? DILI?Marland Kitchen  What had been a background of mildly elevated LFTs and fatty liver became acutely worse within 24 hours of colonoscopy and propofol sedation. Suspect the patient's hepatitis/hepatomegaly is the source of his right upper quadrant pain. MELD 20.  Disc fx score 34.6 Acute hepatitis serologies and autoimmune markers unrevealing GGT,  pending IV Albumin 25g q 6 hours WBCs, T bili rising but alk phos, transaminases all improved.  *   Coagulopathy.  INR 1.5.  Day 1/3 IV Vit K.     *   Abnormal  CT abdomen pelvis.  FOBT positive stool. 07/08/19 colonoscopy: bx of IC mass (path benign), 5 mm descending polyp (TA) removed.    *    Hyponatremia.  *    Hypokalemia.  *   Anemia. macrocytosis.   *   HE, subclinical.  Ammonia 39.  Lactulose in place.  ~ 2 BM yesterday.    PLAN   *   Ordered HBs Ab titer and HAV total Ab.  If neg: vaccination for A and B  *    SQ vitamin K x3 days.  CMET, Coags daily.   *   ? Repeat ultrasound to assess for ascites?, none seen on CT or ultrasound 4/13.        Christopher Carrillo  07/31/2019, 8:46 AM Phone [phone]

## 2019-07-31 NOTE — Plan of Care (Signed)
  Problem: Pain Managment: Goal: General experience of comfort will improve Outcome: Progressing   

## 2019-07-31 NOTE — Progress Notes (Addendum)
Progress Note    Christopher Carrillo  GYJ:[id number] DOB: 1971-01-20  DOA: 07/29/2019 PCP: Alycia Rossetti, MD    Brief Narrative:     Medical records reviewed and are as summarized below:  Christopher Carrillo is an 49 y.o. male with medical history significant for Hx of alcoholic hepatitis, psoriasis, hypertension, hyperlipidemia, anxiety and depression who presents due to worsening jaundice, fatigue, confusion and new lower extremity edema.   Patient was recently admitted from 4/13-4/18 and was treated for colitis and was found to have drug induced hepatitis and urinary obstruction. He was discharged with bilirubin of 12 with outpatient follow up with GI. He was then referral to [facility] in [place] due to MELD score of 24.  He was following [facility] previously for colonic mass and recently underwent biopsy during colonoscopy on 3/31 that shows benign mucosa and thought to be due to intermittent ileocecal intussusception.   Assessment/Plan:   Active Problems:   Essential hypertension   Abdominal pain   Elevated bilirubin   Alcoholic hepatitis without ascites   Lower extremity edema   Urinary hesitancy   Anemia of chronic disease   Severe alcoholic hepatitis/worsening bilirubin/fatigue/confusion GI thinks he has hallmarks of alcoholic hepatitis in the past but has had extensive negative studies. He was referred to transplant center in [place] and awaiting appointment. GI consult appreciated:  -2g Na regular protein diet. -Strictly no more alcohol. -IV albumin 25g IV Q8 hrs -Lactulose 30 cc po bid. Titrate to 2-3 BMs/day -US guided paracentesis 4/23 (r/o SBP) -Check HBsAb titer and HAV total Ab.  If neg, would recommend vaccination for A and B. -Trend CBC, CMP, PT INR. -Would add diuretics if US shows ascites or LL edema worens. -Thiamine/folic acid/zinc. -Avoid hepatotoxic meds Ammonia only mildly elevated- lactulose started Blood pressure lower end  of normal  -have ordered a limited U/S for ascites- if found will order paracentesis with labs  LE edema Likely due to worsening liver disease and hypoalbuminemia  Encourage patient to elevate legs  Right sided abdominal pain Overall benign exam  Could be due to hepatomegaly  Has hx of ileoceal intussusception   Continue PRN pain management w/o tylenol  Urinary hesitancy Could be due to increase abdominal distention Bladder scan showed around 400cc. Normal creatinine Continue Flomax Continue with outpatient urology follow up   Patient does not have thrombocytopenia  Hyponatremia -Monitor -May need fluid restriction  Anemia of chronic disease Stable   Hypertension Stable   Chronic alcoholism Reports no alcohol for the past month Continue folic and thiamine     Family Communication/Anticipated D/C date and plan/Code Status   DVT prophylaxis: Lovenox ordered. Code Status: Full Code.  Family Communication: Wife at bedside 4/22 Disposition Plan: Status is: Inpatient  Remains inpatient appropriate because:Inpatient level of care appropriate due to severity of illness   Dispo: The patient is from: Home              Anticipated d/c is to: Home              Anticipated d/c date is: 3 days              Patient currently is not medically stable to d/c.  Needs further GI work up         Medical Consultants:    GI   Subjective:   Says he did not sleep well  Objective:    Vitals:   07/30/19 1940 07/31/19 0104 07/31/19 0408 07/31/19  0742  BP: 110/68 (!) 109/59 108/65 101/62  Pulse: (!) 110 (!) 106 97 96  Resp: 15 14 14 17   Temp: 98.2 F (36.8 C) 98.4 F (36.9 C) 97.9 F (36.6 C) (!) 97.5 F (36.4 C)  TempSrc: Oral Oral Oral Oral  SpO2: 98% 96% 93% 97%  Weight:      Height:        Intake/Output Summary (Last 24 hours) at 07/31/2019 0959 Last data filed at 07/31/2019 0900 Gross per 24 hour  Intake 825.62 ml  Output 1840 ml  Net -1014.38  ml   Filed Weights   07/29/19 1417  Weight: 88.5 kg    Exam: In bed, jaundiced Large abdomen, soft +LE edema rrr No increased work of breathing   Data Reviewed:   I have personally reviewed following labs and imaging studies:  Labs: Labs show the following:   Basic Metabolic Panel: Recent Labs  Lab 07/25/19 0745 07/25/19 0745 07/26/19 0752 07/26/19 0752 07/29/19 1409 07/29/19 1409 07/30/19 0415 07/31/19 0508  NA 131*  --  130*  --  130*  --  129* 126*  K 3.5   < > 3.7   < > 3.5   < > 3.4* 3.4*  CL 97*  --  96*  --  93*  --  93* 92*  CO2 25  --  23  --  26  --  26 22  GLUCOSE 89  --  81  --  104*  --  99 101*  BUN 5*  --  7  --  5*  --  5* 7  CREATININE <0.30*  --  <0.30*  --  0.74  --  0.76 0.89  CALCIUM 7.9*  --  7.7*  --  8.2*  --  7.8* 7.6*  MG 1.8  --  1.9  --   --   --   --   --   PHOS  --   --  3.5  --   --   --   --   --    < > = values in this interval not displayed.   GFR Estimated Creatinine Clearance: 112.9 mL/min (by C-G formula based on SCr of 0.89 mg/dL). Liver Function Tests: Recent Labs  Lab 07/25/19 0745 07/26/19 0752 07/29/19 1409 07/30/19 0415 07/31/19 0508  AST 106* 113* 143* 115* 105*  ALT 35 35 40 30 35  ALKPHOS 199* 182* 228* 173* 141*  BILITOT 12.3* 13.4* 19.0* 16.0* 18.0*  PROT 5.0* 5.1* 6.0* 4.9* 4.9*  ALBUMIN 1.9* 1.8* 1.9* 1.5* 1.8*   Recent Labs  Lab 07/29/19 1409  LIPASE 72*   Recent Labs  Lab 07/30/19 0832  AMMONIA 39*   Coagulation profile Recent Labs  Lab 07/25/19 0745 07/29/19 1840 07/31/19 0508  INR 1.2 1.3* 1.5*    CBC: Recent Labs  Lab 07/25/19 0747 07/29/19 1409 07/30/19 0415 07/31/19 0508  WBC 11.4* 14.9* 13.9* 16.9*  HGB 11.6* 12.4* 11.3* 10.2*  HCT 33.9* 35.9* 32.0* 29.3*  MCV 106.6* 106.5* 103.6* 104.6*  PLT 448* 590* 488* 474*   Cardiac Enzymes: No results for input(s): CKTOTAL, CKMB, CKMBINDEX, TROPONINI in the last 168 hours. BNP (last 3 results) No results for input(s):  PROBNP in the last 8760 hours. CBG: No results for input(s): GLUCAP in the last 168 hours. D-Dimer: No results for input(s): DDIMER in the last 72 hours. Hgb A1c: No results for input(s): HGBA1C in the last 72 hours. Lipid Profile: No results for input(s): CHOL, HDL,  LDLCALC, TRIG, CHOLHDL, LDLDIRECT in the last 72 hours. Thyroid function studies: No results for input(s): TSH, T4TOTAL, T3FREE, THYROIDAB in the last 72 hours.  Invalid input(s): FREET3 Anemia work up: No results for input(s): VITAMINB12, FOLATE, FERRITIN, TIBC, IRON, RETICCTPCT in the last 72 hours. Sepsis Labs: Recent Labs  Lab 07/25/19 0747 07/29/19 1409 07/30/19 0415 07/31/19 0508  WBC 11.4* 14.9* 13.9* 16.9*    Microbiology Recent Results (from the past 240 hour(s))  Urine culture     Status: None   Collection Time: 07/21/19  3:25 PM   Specimen: Urine, Random  Result Value Ref Range Status   Specimen Description   Final    URINE, RANDOM Performed at [facility], [address] [place], [place] [address]    Special Requests   Final    NONE Performed at [facility], [address], [place], [place] [address]    Culture   Final    NO GROWTH Performed at [facility], [place] [address], [place], [place] [address]    Report Status 07/23/2019 FINAL  Final  Blood culture (routine x 2)     Status: None   Collection Time: 07/21/19  7:00 PM   Specimen: Right Antecubital; Blood  Result Value Ref Range Status   Specimen Description RIGHT ANTECUBITAL  Final   Special Requests   Final    BOTTLES DRAWN AEROBIC AND ANAEROBIC Blood Culture adequate volume   Culture   Final    NO GROWTH 5 DAYS Performed at [facility], [address], [place], [place] [address]    Report Status 07/26/2019 FINAL  Final  Blood culture (routine x 2)     Status: None   Collection Time: 07/21/19  7:00 PM   Specimen: Left Antecubital; Blood  Result Value Ref Range Status   Specimen Description LEFT ANTECUBITAL  Final    Special Requests   Final    BOTTLES DRAWN AEROBIC AND ANAEROBIC Blood Culture adequate volume   Culture   Final    NO GROWTH 5 DAYS Performed at [facility], [address] [place], [place] [address]    Report Status 07/26/2019 FINAL  Final  SARS CORONAVIRUS 2 (TAT 6-24 HRS) Nasopharyngeal Nasopharyngeal Swab     Status: None   Collection Time: 07/29/19  7:33 PM   Specimen: Nasopharyngeal Swab  Result Value Ref Range Status   SARS Coronavirus 2 NEGATIVE NEGATIVE Final    Comment: (NOTE) SARS-CoV-2 target nucleic acids are NOT DETECTED. The SARS-CoV-2 RNA is generally detectable in upper and lower respiratory specimens during the acute phase of infection. Negative results do not preclude SARS-CoV-2 infection, do not rule out co-infections with other pathogens, and should not be used as the sole basis for treatment or other patient management decisions. Negative results must be combined with clinical observations, patient history, and epidemiological information. The expected result is Negative. Fact Sheet for Patients: SugarRoll.be Fact Sheet for Healthcare Providers: https://www.woods-mathews.com/ This test is not yet approved or cleared by the [place] FDA and  has been authorized for detection and/or diagnosis of SARS-CoV-2 by FDA under an Emergency Use Authorization (EUA). This EUA will remain  in effect (meaning this test can be used) for the duration of the COVID-19 declaration under Section 56 4(b)(1) of the Act, 21 U.S.C. section 360bbb-3(b)(1), unless the authorization is terminated or revoked sooner. Performed at [facility], [place] [address], [place],  [address]     Procedures and diagnostic studies:  No results found.  Medications:   . enoxaparin (LOVENOX) injection  40 mg Subcutaneous Q24H  . folic acid  1 mg Oral Daily  . lactulose  10 g Oral BID  . multivitamin with minerals  1 tablet Oral Daily  .  phytonadione  10 mg Subcutaneous Daily  . tamsulosin  0.4 mg Oral QPC supper  . thiamine  100 mg Oral Daily  . zinc sulfate  220 mg Oral Daily   Continuous Infusions: . albumin human 25 g (07/31/19 0508)     LOS: 2 days   Geradine Girt  [facility]   How to contact the [facility] Attending or Consulting provider [place] or covering provider during after hours [place], for this patient?  1. Check the care team in [facility] and look for a) attending/consulting [facility] provider listed and b) the [facility] team listed 2. Log into [email or web address] and use [facility] [place]'s universal password to access. If you do not have the password, please contact the hospital operator. 3. Locate the [facility] provider you are looking for under [facility] and page to a number that you can be directly reached. 4. If you still have difficulty reaching the provider, please page the [facility] (Director on Call) for the Hospitalists listed on amion for assistance.  07/31/2019, 9:59 AM

## 2019-08-01 DIAGNOSIS — K7010 Alcoholic hepatitis without ascites: Secondary | ICD-10-CM | POA: Diagnosis not present

## 2019-08-01 LAB — PROTIME-INR
INR: 1.5 — ABNORMAL HIGH (ref 0.8–1.2)
Prothrombin Time: 18 seconds — ABNORMAL HIGH (ref 11.4–15.2)

## 2019-08-01 LAB — COMPREHENSIVE METABOLIC PANEL
ALT: 30 U/L (ref 0–44)
AST: 111 U/L — ABNORMAL HIGH (ref 15–41)
Albumin: 2.5 g/dL — ABNORMAL LOW (ref 3.5–5.0)
Alkaline Phosphatase: 134 U/L — ABNORMAL HIGH (ref 38–126)
Anion gap: 11 (ref 5–15)
BUN: 5 mg/dL — ABNORMAL LOW (ref 6–20)
CO2: 23 mmol/L (ref 22–32)
Calcium: 8.1 mg/dL — ABNORMAL LOW (ref 8.9–10.3)
Chloride: 93 mmol/L — ABNORMAL LOW (ref 98–111)
Creatinine, Ser: 0.7 mg/dL (ref 0.61–1.24)
GFR calc Af Amer: >60 mL/min (ref >60)
GFR calc non Af Amer: >60 mL/min (ref >60)
Glucose, Bld: 91 mg/dL (ref 70–99)
Potassium: 3.2 mmol/L — ABNORMAL LOW (ref 3.5–5.1)
Sodium: 127 mmol/L — ABNORMAL LOW (ref 135–145)
Total Bilirubin: 21.4 mg/dL (ref 0.3–1.2)
Total Protein: 5.3 g/dL — ABNORMAL LOW (ref 6.5–8.1)

## 2019-08-01 LAB — CBC
HCT: 29.8 % — ABNORMAL LOW (ref 39.0–52.0)
Hemoglobin: 10.6 g/dL — ABNORMAL LOW (ref 13.0–17.0)
MCH: 36.7 pg — ABNORMAL HIGH (ref 26.0–34.0)
MCHC: 35.6 g/dL (ref 30.0–36.0)
MCV: 103.1 fL — ABNORMAL HIGH (ref 80.0–100.0)
Platelets: 477 10*3/uL — ABNORMAL HIGH (ref 150–400)
RBC: 2.89 MIL/uL — ABNORMAL LOW (ref 4.22–5.81)
RDW: 15.9 % — ABNORMAL HIGH (ref 11.5–15.5)
WBC: 17.6 10*3/uL — ABNORMAL HIGH (ref 4.0–10.5)
nRBC: 0 % (ref 0.0–0.2)

## 2019-08-01 LAB — HEPATITIS A ANTIBODY, TOTAL: hep A Total Ab: NONREACTIVE

## 2019-08-01 MED ORDER — POTASSIUM CHLORIDE CRYS ER 20 MEQ PO TBCR
40.0000 meq | EXTENDED_RELEASE_TABLET | Freq: Once | ORAL | Status: AC
  Administered 2019-08-01: 40 meq via ORAL
  Filled 2019-08-01: qty 2

## 2019-08-01 NOTE — Plan of Care (Signed)
  Problem: Education: Goal: Knowledge of General Education information will improve Description: Including pain rating scale, medication(s)/side effects and non-pharmacologic comfort measures Outcome: Progressing   Problem: Activity: Goal: Risk for activity intolerance will decrease Outcome: Progressing   Problem: Nutrition: Goal: Adequate nutrition will be maintained Outcome: Progressing   

## 2019-08-01 NOTE — Consult Note (Addendum)
Chief Complaint: Patient was seen in consultation today for image guided random core liver biopsy Chief Complaint  Patient presents with  . Abdominal Pain  . Leg Swelling     Referring Physician(s): Sandi Carne, PA-C  Supervising Physician: Sandi Mariscal  Patient Status: [facility] - In-pt  History of Present Illness: Christopher Carrillo is a 49 y.o. male with history of hypertension, psoriasis, hyperlipidemia, anxiety/depression and alcoholic hepatitis who was admitted to [facility] on 4/21 with fatigue, worsening jaundice, abdominal discomfort and new lower extremity edema.  He was recently admitted from 4/13-4/18 for colitis and was found to have drug-induced hepatitis and urinary obstruction.  He was discharged with bilirubin of 12 with outpatient follow-up with GI.  He is also a smoker.  Latest CT abdomen/pelvis performed on 4/13 revealed severe fatty infiltration of the liver, no biliary dilatation, edema in the right colon possible infectious or ischemic colitis.  Limited abdominal ultrasound yesterday showed small ascites.  Current labs include WBC 17.6, hemoglobin 10.6, platelets 477K, K 3.2, creatinine normal, total bilirubin 21.4, PT 18, INR 1.5.  Request now received from GI service for image guided random core liver biopsy for further evaluation.  Past Medical History:  Diagnosis Date  . Abdominal pain   . Alcoholic hepatitis without ascites   . Anemia   . Elevated bilirubin   . Essential hypertension   . Psoriasis   . Transaminitis     Past Surgical History:  Procedure Laterality Date  . BIOPSY  07/20/2019   Procedure: BIOPSY;  Surgeon: Daneil Dolin, MD;  Location: [facility];  Service: Endoscopy;;  . COLONOSCOPY WITH PROPOFOL N/A 07/20/2019   Procedure: COLONOSCOPY WITH PROPOFOL;  Surgeon: Daneil Dolin, MD;  Location: [facility];  Service: Endoscopy;  Laterality: N/A;  10:15am  . NO PAST SURGERIES    . POLYPECTOMY  07/20/2019   Procedure: POLYPECTOMY;   Surgeon: Daneil Dolin, MD;  Location: [facility];  Service: Endoscopy;;    Allergies: Patient has no known allergies.  Medications: Prior to Admission medications   Medication Sig Start Date End Date Taking? Authorizing Provider  folic acid (FOLVITE) 1 MG tablet Take 1 tablet (1 mg total) by mouth daily. 07/27/19  Yes Johnson, Clanford L, MD  Hyprom-Naphaz-Polysorb-Zn Sulf (CLEAR EYES COMPLETE OP) Place 1 drop into both eyes daily as needed (for dry eyes).   Yes [provider]  Multiple Vitamin (MULTIVITAMIN WITH MINERALS) TABS tablet Take 1 tablet by mouth daily. 07/27/19  Yes Johnson, Clanford L, MD  oxyCODONE (OXY IR/ROXICODONE) 5 MG immediate release tablet Take 1 tablet (5 mg total) by mouth every 6 (six) hours as needed for up to 7 days for severe pain. 07/26/19 08/02/19 Yes Johnson, Clanford L, MD  polyethylene glycol (MIRALAX / GLYCOLAX) 17 g packet Take 17 g by mouth daily as needed for mild constipation. 07/26/19  Yes Johnson, Clanford L, MD  tamsulosin (FLOMAX) 0.4 MG CAPS capsule Take 1 capsule (0.4 mg total) by mouth daily after supper. 07/26/19  Yes Johnson, Clanford L, MD  thiamine 100 MG tablet Take 1 tablet (100 mg total) by mouth daily. 07/27/19  Yes Murlean Iba, MD     Family History  Problem Relation Age of Onset  . Asthma Father   . Cancer Sister 18       Pharyngeal  . Colon cancer Neg Hx   . Liver disease Neg Hx     Social History   Socioeconomic History  . Marital status:  Married    Spouse name: Not on file  . Number of children: Not on file  . Years of education: Not on file  . Highest education level: Not on file  Occupational History  . Not on file  Tobacco Use  . Smoking status: Current Every Day Smoker    Packs/day: 0.33    Types: Cigarettes  . Smokeless tobacco: Never Used  Substance and Sexual Activity  . Alcohol use: Not Currently    Comment: 3-4 beers/night  . Drug use: No  . Sexual activity: Yes  Other Topics Concern  .  Not on file  Social History Narrative  . Not on file   Social Determinants of Health   Financial Resource Strain:   . Difficulty of Paying Living Expenses:   Food Insecurity:   . Worried About Charity fundraiser in the Last Year:   . Arboriculturist in the Last Year:   Transportation Needs:   . Film/video editor (Medical):   Marland Kitchen Lack of Transportation (Non-Medical):   Physical Activity:   . Days of Exercise per Week:   . Minutes of Exercise per Session:   Stress:   . Feeling of Stress :   Social Connections:   . Frequency of Communication with Friends and Family:   . Frequency of Social Gatherings with Friends and Family:   . Attends Religious Services:   . Active Member of Clubs or Organizations:   . Attends Archivist Meetings:   Marland Kitchen Marital Status:       Review of Systems currently denies fever, headache, chest pain, cough, back pain, nausea, vomiting or bleeding.  He does have some dyspnea with exertion/abdominal discomfort/distention and lower extremity edema.  He is jaundiced.  Vital Signs: BP 118/69 (BP Location: Left Arm)   Pulse 98   Temp 98 F (36.7 C) (Oral)   Resp 18   Ht 5' 9"  (1.753 m)   Wt 195 lb (88.5 kg)   SpO2 100%   BMI 28.80 kg/m   Physical Exam awake, alert.  Jaundiced, chest-clear to auscultation bilaterally.  Heart with regular rate and rhythm.  Abdomen distended, positive bowel sounds, tender right upper quadrant/epigastric region; 2-3+ bilateral lower extremity edema noted.  Imaging: US Abdomen Complete  Result Date: 07/21/2019 CLINICAL DATA:  Abdominal distension in jaundice EXAM: ABDOMEN ULTRASOUND COMPLETE COMPARISON:  07/08/2019 FINDINGS: Gallbladder: No gallstones or wall thickening visualized. No sonographic Murphy sign noted by sonographer. Common bile duct: Diameter: 2.2 mm. Liver: Hepatomegaly is noted. Increased echogenicity with heterogeneity is seen consistent with fatty infiltration noted on prior CT examination.  Portal vein is patent on color Doppler imaging with normal direction of blood flow towards the liver. IVC: No abnormality visualized. Pancreas: Visualized portion unremarkable. Spleen: Size and appearance within normal limits. Right Kidney: Length: 12.4 cm. Echogenicity within normal limits. No mass or hydronephrosis visualized. Left Kidney: Length: 11.1 cm. 1 cm small cyst is noted in the lower pole of the left kidney. No mass lesion or hydronephrosis is noted. Abdominal aorta: No aneurysm visualized. Other findings: None. IMPRESSION: Fatty liver with hepatomegaly.  No mass lesion is seen. Small left renal cyst Electronically Signed   By: Inez Catalina M.D.   On: 07/21/2019 17:58   CT ABDOMEN PELVIS W CONTRAST  Result Date: 07/21/2019 CLINICAL DATA:  Elevated LFT and jaundice. Rule out common duct stone EXAM: CT ABDOMEN AND PELVIS WITH CONTRAST TECHNIQUE: Multidetector CT imaging of the abdomen and pelvis was performed using  the standard protocol following bolus administration of intravenous contrast. CONTRAST:  167m OMNIPAQUE IOHEXOL 300 MG/ML  SOLN COMPARISON:  Ultrasound abdomen 07/21/2019. CT abdomen pelvis 07/08/2019 FINDINGS: Lower chest: Lung bases clear bilaterally. Hepatobiliary: Marked fatty infiltration liver which is diffusely low density and enlarged. There are scattered patchy areas of fatty sparing in the liver. Gallbladder and bile ducts normal. No biliary dilatation. Pancreas: Negative for mass or edema. Spleen: Negative Adrenals/Urinary Tract: Adrenal glands are unremarkable. Kidneys are normal, without renal calculi, focal lesion, or hydronephrosis. Bladder is unremarkable. Stomach/Bowel: Negative for bowel obstruction. Diverticulosis in the cecum. There is mucosal edema throughout most of the right colon. This appears slightly improved from the prior study and may be due to colitis. Although there are diverticula in the cecum, the edema extends throughout the right colon therefore infectious  colitis seems more likely than diverticulitis. Bowel ischemia also a consideration. Normal appendix. Vascular/Lymphatic: Mild atherosclerotic disease. No aneurysm. Negative for lymphadenopathy. Reproductive: Prostate not enlarged. Other: Small amount of free fluid in the pelvis. Musculoskeletal: No acute skeletal abnormality. IMPRESSION: 1. Severe fatty infiltration liver.  No biliary dilatation. 2. Edema in the right colon. Possible infectious or ischemic colitis. Review of the prior CT reveals edema in the cecum with possible intussusception of the ileum into the colon. This appears to have resolved in the interval. Electronically Signed   By: CFranchot GalloM.D.   On: 07/21/2019 20:24   CT ABDOMEN PELVIS W CONTRAST  Result Date: 07/08/2019 CLINICAL DATA:  Right-sided abdominal pain, nausea and vomiting, elevated liver enzymes EXAM: CT ABDOMEN AND PELVIS WITH CONTRAST TECHNIQUE: Multidetector CT imaging of the abdomen and pelvis was performed using the standard protocol following bolus administration of intravenous contrast. CONTRAST:  1061mOMNIPAQUE IOHEXOL 300 MG/ML  SOLN COMPARISON:  02/02/2019 FINDINGS: Lower chest: No acute pleural or parenchymal lung disease. Hepatobiliary: Diffuse hepatic steatosis without focal abnormality. The gallbladder is unremarkable. Pancreas: Unremarkable. No pancreatic ductal dilatation or surrounding inflammatory changes. Spleen: Normal in size without focal abnormality. Adrenals/Urinary Tract: Adrenal glands are unremarkable. Kidneys are normal, without renal calculi, focal lesion, or hydronephrosis. Bladder is unremarkable. Stomach/Bowel: A normal retrocecal appendix is identified. There is masslike thickening within the cecum at the ileocecal valve, measuring 4.4 x 3.1 x 4.8 cm. Neoplasm cannot be excluded, and colonoscopy is recommended for further evaluation. There is no bowel obstruction or ileus. Minimal distal colonic diverticulosis without diverticulitis.  Vascular/Lymphatic: Aortic atherosclerosis. No enlarged abdominal or pelvic lymph nodes. Reproductive: Prostate is unremarkable. Other: There several subcentimeter lymph nodes in the right lower quadrant mesentery, nonspecific. I do not see any pathologic adenopathy within the abdomen or pelvis. No free intra-abdominal gas. Musculoskeletal: No acute or destructive bony lesions. Reconstructed images demonstrate no additional findings. IMPRESSION: 1. 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 Colonoscopy is recommended for further evaluation. 2. Diffuse hepatic steatosis without focal abnormality. Electronically Signed   By: MiRanda Ngo.D.   On: 07/08/2019 20:18   USKoreabdomen Limited  Result Date: 07/31/2019 CLINICAL DATA:  [age]ear old male with abdominal distension. Alcoholic hepatitis and jaundice. Evaluate for ascites. EXAM: LIMITED ABDOMEN ULTRASOUND FOR ASCITES TECHNIQUE: Limited ultrasound survey for ascites was performed in all four abdominal quadrants. COMPARISON:  Right upper quadrant ultrasound dated 07/21/2019. FINDINGS: There is a very small ascites in the lower abdomen. There is diffuse increased liver echogenicity most commonly seen in the setting of fatty infiltration. Superimposed inflammation or fibrosis is not excluded. Clinical correlation is recommended. IMPRESSION:  Small ascites. Electronically Signed   By: Anner Crete M.D.   On: 07/31/2019 17:48    Labs:  CBC: Recent Labs    07/29/19 1409 07/30/19 0415 07/31/19 0508 08/01/19 0346  WBC 14.9* 13.9* 16.9* 17.6*  HGB 12.4* 11.3* 10.2* 10.6*  HCT 35.9* 32.0* 29.3* 29.8*  PLT 590* 488* 474* 477*    COAGS: Recent Labs    07/21/19 1457 07/23/19 0526 07/25/19 0745 07/29/19 1840 07/31/19 0508 08/01/19 0346  INR 1.1   < > 1.2 1.3* 1.5* 1.5*  APTT 41*  --   --   --   --   --    < > = values in this interval not displayed.    BMP: Recent Labs    07/29/19 1409 07/30/19 0415  07/31/19 0508 08/01/19 0346  NA 130* 129* 126* 127*  K 3.5 3.4* 3.4* 3.2*  CL 93* 93* 92* 93*  CO2 26 26 22 23   GLUCOSE 104* 99 101* 91  BUN 5* 5* 7 5*  CALCIUM 8.2* 7.8* 7.6* 8.1*  CREATININE 0.74 0.76 0.89 0.70  GFRNONAA >60 >60 >60 >60  GFRAA >60 >60 >60 >60    LIVER FUNCTION TESTS: Recent Labs    07/29/19 1409 07/30/19 0415 07/31/19 0508 08/01/19 0346  BILITOT 19.0* 16.0* 18.0* 21.4*  AST 143* 115* 105* 111*  ALT 40 30 35 30  ALKPHOS 228* 173* 141* 134*  PROT 6.0* 4.9* 4.9* 5.3*  ALBUMIN 1.9* 1.5* 1.8* 2.5*    TUMOR MARKERS: No results for input(s): AFPTM, CEA, CA199, CHROMGRNA in the last 8760 hours.  Assessment and Plan: 49 y.o. male with history of hypertension, psoriasis, hyperlipidemia, anxiety/depression and alcoholic hepatitis who was admitted to [facility] on 4/21 with fatigue, worsening jaundice, abdominal discomfort and new lower extremity edema.  He was recently admitted from 4/13-4/18 for colitis and was found to have drug-induced hepatitis/elevated LFT's and urinary obstruction.  He was discharged with bilirubin of 12 with outpatient follow-up with GI.  He is also a smoker.  Latest CT abdomen and pelvis performed on 4/13 revealed severe fatty infiltration of the liver, no biliary dilatation, edema in the right colon possible infectious or ischemic colitis.  Limited abdominal ultrasound yesterday showed small ascites.  Current labs include WBC 17.6, hemoglobin 10.6, platelets 477K, K 3.2, creatinine normal, total bilirubin 21.4, PT 18, INR 1.5.  Request now received from GI service for image guided random core liver biopsy for further evaluation.Risks and benefits of procedure was discussed with the patient/spouse including, but not limited to bleeding, infection, damage to adjacent structures or low yield requiring additional tests.  All of the questions were answered and there is agreement to proceed.  Consent signed and in chart.  Procedure tentatively  scheduled for 4/26; patient also consented for possible paracentesis prior to biopsy if needed   Thank you for this interesting consult.  I greatly enjoyed meeting Christopher Carrillo and look forward to participating in their care.  A copy of this report was sent to the requesting provider on this date.  Electronically Signed: D. Rowe Robert, PA-C 08/01/2019, 2:59 PM   I spent a total of  25 minutes   in face to face in clinical consultation, greater than 50% of which was counseling/coordinating care for image guided random core liver biopsy

## 2019-08-01 NOTE — Consult Note (Addendum)
Daily Rounding Note  08/01/2019, 9:13 AM  LOS: 3 days   SUBJECTIVE:   Chief complaint: Hepatitis, hepatomegaly.  Jaundice.  Still having the discomfort/pain in right upper quadrant, not as bad as it was.  Had 2 or 3 bowel movements yesterday, 2 so far this morning.  Eating well.  No nausea.  Lingering malaise though somewhat improved  Not able to sleep more than 3 hours at night as the IV beeps frequently.  OBJECTIVE:         Vital signs in last 24 hours:    Temp:  [97.8 F (36.6 C)-98.5 F (36.9 C)] 98.2 F (36.8 C) (04/24 0818) Pulse Rate:  [95-106] 106 (04/24 0818) Resp:  [15-18] 17 (04/24 0818) BP: (103-119)/(64-70) 119/70 (04/24 0818) SpO2:  [97 %-100 %] 99 % (04/24 0818) Last BM Date: 07/31/19 Filed Weights   07/29/19 1417  Weight: 88.5 kg   General: Jaundiced.  Comfortable. Heart: RRR. Chest: Clear bilaterally.  No labored breathing.  No cough. Abdomen: Soft, mildly protuberant.  Mild tenderness right upper quadrant without guarding or rebound.  Active bowel sounds. Extremities: Mild to moderate, nonpitting, swelling in feet Neuro/Psych: Oriented x3.  No asterixis or upper extremity tremor.  Moves all 4 limbs.  Appropriate.  Psycho motor slowing less pronounced than past few days.  Intake/Output from previous day: 04/23 0701 - 04/24 0700 In: 1420 [P.O.:1320; IV Piggyback:100] Out: 2450 [Urine:2450]  Intake/Output this shift: No intake/output data recorded.  Lab Results: Recent Labs    07/30/19 0415 07/31/19 0508 08/01/19 0346  WBC 13.9* 16.9* 17.6*  HGB 11.3* 10.2* 10.6*  HCT 32.0* 29.3* 29.8*  PLT 488* 474* 477*   BMET Recent Labs    07/30/19 0415 07/31/19 0508 08/01/19 0346  NA 129* 126* 127*  K 3.4* 3.4* 3.2*  CL 93* 92* 93*  CO2 26 22 23   GLUCOSE 99 101* 91  BUN 5* 7 5*  CREATININE 0.76 0.89 0.70  CALCIUM 7.8* 7.6* 8.1*   LFT Recent Labs    07/30/19 0415 07/31/19 0508  08/01/19 0346  PROT 4.9* 4.9* 5.3*  ALBUMIN 1.5* 1.8* 2.5*  AST 115* 105* 111*  ALT 30 35 30  ALKPHOS 173* 141* 134*  BILITOT 16.0* 18.0* 21.4*   PT/INR Recent Labs    07/31/19 0508 08/01/19 0346  LABPROT 17.6* 18.0*  INR 1.5* 1.5*   Hepatitis Panel No results for input(s): HEPBSAG, HCVAB, HEPAIGM, HEPBIGM in the last 72 hours.  Studies/Results: US Abdomen Limited  Result Date: 07/31/2019 CLINICAL DATA:  49 year old male with abdominal distension. Alcoholic hepatitis and jaundice. Evaluate for ascites. EXAM: LIMITED ABDOMEN ULTRASOUND FOR ASCITES TECHNIQUE: Limited ultrasound survey for ascites was performed in all four abdominal quadrants. COMPARISON:  Right upper quadrant ultrasound dated 07/21/2019. FINDINGS: There is a very small ascites in the lower abdomen. There is diffuse increased liver echogenicity most commonly seen in the setting of fatty infiltration. Superimposed inflammation or fibrosis is not excluded. Clinical correlation is recommended. IMPRESSION: Small ascites. Electronically Signed   By: Anner Crete M.D.   On: 07/31/2019 17:48   Scheduled Meds: . enoxaparin (LOVENOX) injection  40 mg Subcutaneous Q24H  . folic acid  1 mg Oral Daily  . lactulose  10 g Oral BID  . multivitamin with minerals  1 tablet Oral Daily  . phytonadione  10 mg Subcutaneous Daily  . tamsulosin  0.4 mg Oral QPC supper  . thiamine  100 mg Oral Daily  .  zinc sulfate  220 mg Oral Daily   Continuous Infusions: . albumin human 25 g (08/01/19 0646)   PRN Meds:.oxyCODONE, polyethylene glycol   ASSESMENT:   * Acute hepatitis, hepatomegaly.  Likely from ETOH, ? DILI?Marland Kitchen What had been a background of mildly elevated LFTs and fatty liver became acutely worse within 24 hours of colonoscopy and propofol sedation. Suspect the patient's hepatitis/hepatomegaly is the source of his right upper quadrant pain. MELD 20. Disc fx score 34.6 Acute hepatitis serologies and autoimmune markers  unrevealing GGT,  pending IV Albumin 25g q 6 hours WBCs, AST slightly increased, T bili rising but alk phos, ALT improved  *   Coagulopathy.  INR 1.5, stable.  Day 2/3 IV Vit K.     * Abnormal CT abdomen pelvis. FOBT positive stool. 07/08/19 colonoscopy: bx of IC mass (path benign), 5 mm descending polyp (TA) removed.   *   Small volume ascites per 4/23 ultrasound.    *    Hyponatremia. 126 >> 127 in last 24 hours.    *    Hypokalemia. Persists.    * Anemia. macrocytosis. Hgb stable.    *   HE, mild.  Ammonia 39.  Lactulose in place. Less psycho-motor slowing and less tremor/asterixis in hands.       PLAN   *   Liver biopsy on Monday.  These are not done on weekends.  Stop Lovenox after this PMs dose to allow for biopsy.    *   Fractionated bili in AM and daily x 3 d  *   Await results of Hep A total Ab an Hep B quant.     *   Do we need to pursue paracentesis?  suspect ascites is too small to tap.  *  Pt needs to mobilize and walk.     Azucena Freed  08/01/2019, 9:13 AM Phone [phone]   Attending physician's note   I have taken an interval history, reviewed the chart and examined the patient. I agree with the Advanced Practitioner's note, impression and recommendations.   Acute on chronic hepatitis (likely alcoholic, R/O DILI or infiltrative) with worsening jaundice. PT INR stable Subclinical hepatic encephalopathy  Plan: -Liver bx Monday.  Explained procedure, risks and benefits. -Trend CBC, LFTs, PT/INR -Continue lactulose.   Carmell Austria, MD Velora Heckler Fabienne Bruns [phone].

## 2019-08-01 NOTE — Plan of Care (Signed)
  Problem: Pain Managment: Goal: General experience of comfort will improve Outcome: Progressing   

## 2019-08-01 NOTE — Progress Notes (Signed)
Pt's total bilirubin this am is 21.4. X.Blount is notified.

## 2019-08-01 NOTE — Progress Notes (Signed)
Progress Note    Christopher Carrillo  [id number]:[id number] DOB: 1970-12-25  DOA: 07/29/2019 PCP: Alycia Rossetti, MD    Brief Narrative:     Medical records reviewed and are as summarized below:  Christopher Carrillo is an 49 y.o. male with medical history significant for Hx of alcoholic hepatitis, psoriasis, hypertension, hyperlipidemia, anxiety and depression who presents due to worsening jaundice, fatigue, confusion and new lower extremity edema.   Patient was recently admitted from 4/13-4/18 and was treated for colitis and was found to have drug induced hepatitis and urinary obstruction. He was discharged with bilirubin of 12 with outpatient follow up with GI. He was then referral to [facility] in [place] due to MELD score of 24.  He was following [facility] previously for colonic mass and recently underwent biopsy during colonoscopy on 3/31 that shows benign mucosa and thought to be due to intermittent ileocecal intussusception.   Assessment/Plan:   Active Problems:   Essential hypertension   Abdominal pain   Elevated bilirubin   Alcoholic hepatitis without ascites   Lower extremity edema   Urinary hesitancy   Anemia of chronic disease   Acute liver failure without hepatic coma   Severe alcoholic hepatitis/worsening bilirubin/fatigue/confusion GI thinks he has hallmarks of alcoholic hepatitis in the past but has had extensive negative studies. He was referred to transplant center in [place] and awaiting appointment. GI consult appreciated: Plan for liver biopsy on Monday  Ammonia only mildly elevated- lactulose started Blood pressure lower end of normal  -have ordered a limited U/S for ascites-very minimal found  LE edema Likely due to worsening liver disease and hypoalbuminemia  Encourage patient to elevate legs  Right sided abdominal pain Overall benign exam  Could be due to hepatomegaly  Has hx of ileoceal intussusception   Continue PRN pain  management w/o tylenol  Urinary hesitancy Could be due to increase abdominal distention Bladder scan showed around 400cc. Normal creatinine Continue Flomax Continue with outpatient urology follow up   Patient does not have thrombocytopenia  Hyponatremia -Monitor -May need fluid restriction  Anemia of chronic disease Stable   Hypertension Stable   Chronic alcoholism Reports no alcohol for the past month Continue folic and thiamine     Family Communication/Anticipated D/C date and plan/Code Status   DVT prophylaxis: Lovenox ordered. Code Status: Full Code.  Family Communication: Wife at bedside 4/24 Disposition Plan: Status is: Inpatient  Remains inpatient appropriate because:Inpatient level of care appropriate due to severity of illness   Dispo: The patient is from: Home              Anticipated d/c is to: Home              Anticipated d/c date is: 3 days              Patient currently is not medically stable to d/c.  Needs further GI work up, including liver biopsy on Monday         Medical Consultants:    GI   Subjective:   In shower  Objective:    Vitals:   07/31/19 1500 07/31/19 1945 08/01/19 0507 08/01/19 0818  BP: 103/64 114/67 118/65 119/70  Pulse: 95 97 97 (!) 106  Resp: 18 15 16 17   Temp: 97.8 F (36.6 C) 98.5 F (36.9 C) 97.8 F (36.6 C) 98.2 F (36.8 C)  TempSrc: Oral Oral Oral Oral  SpO2: 98% 100% 97% 99%  Weight:      Height:  Intake/Output Summary (Last 24 hours) at 08/01/2019 1239 Last data filed at 08/01/2019 0900 Gross per 24 hour  Intake 1540 ml  Output 1950 ml  Net -410 ml   Filed Weights   07/29/19 1417  Weight: 88.5 kg    Exam: Patient in shower, no acute distress   Data Reviewed:   I have personally reviewed following labs and imaging studies:  Labs: Labs show the following:   Basic Metabolic Panel: Recent Labs  Lab 07/26/19 0752 07/26/19 0752 07/29/19 1409 07/29/19 1409 07/30/19  0415 07/30/19 0415 07/31/19 0508 08/01/19 0346  NA 130*  --  130*  --  129*  --  126* 127*  K 3.7   < > 3.5   < > 3.4*   < > 3.4* 3.2*  CL 96*  --  93*  --  93*  --  92* 93*  CO2 23  --  26  --  26  --  22 23  GLUCOSE 81  --  104*  --  99  --  101* 91  BUN 7  --  5*  --  5*  --  7 5*  CREATININE <0.30*  --  0.74  --  0.76  --  0.89 0.70  CALCIUM 7.7*  --  8.2*  --  7.8*  --  7.6* 8.1*  MG 1.9  --   --   --   --   --   --   --   PHOS 3.5  --   --   --   --   --   --   --    < > = values in this interval not displayed.   GFR Estimated Creatinine Clearance: 125.6 mL/min (by C-G formula based on SCr of 0.7 mg/dL). Liver Function Tests: Recent Labs  Lab 07/26/19 0752 07/29/19 1409 07/30/19 0415 07/31/19 0508 08/01/19 0346  AST 113* 143* 115* 105* 111*  ALT 35 40 30 35 30  ALKPHOS 182* 228* 173* 141* 134*  BILITOT 13.4* 19.0* 16.0* 18.0* 21.4*  PROT 5.1* 6.0* 4.9* 4.9* 5.3*  ALBUMIN 1.8* 1.9* 1.5* 1.8* 2.5*   Recent Labs  Lab 07/29/19 1409  LIPASE 72*   Recent Labs  Lab 07/30/19 0832  AMMONIA 39*   Coagulation profile Recent Labs  Lab 07/29/19 1840 07/31/19 0508 08/01/19 0346  INR 1.3* 1.5* 1.5*    CBC: Recent Labs  Lab 07/29/19 1409 07/30/19 0415 07/31/19 0508 08/01/19 0346  WBC 14.9* 13.9* 16.9* 17.6*  HGB 12.4* 11.3* 10.2* 10.6*  HCT 35.9* 32.0* 29.3* 29.8*  MCV 106.5* 103.6* 104.6* 103.1*  PLT 590* 488* 474* 477*   Cardiac Enzymes: No results for input(s): CKTOTAL, CKMB, CKMBINDEX, TROPONINI in the last 168 hours. BNP (last 3 results) No results for input(s): PROBNP in the last 8760 hours. CBG: No results for input(s): GLUCAP in the last 168 hours. D-Dimer: No results for input(s): DDIMER in the last 72 hours. Hgb A1c: No results for input(s): HGBA1C in the last 72 hours. Lipid Profile: No results for input(s): CHOL, HDL, LDLCALC, TRIG, CHOLHDL, LDLDIRECT in the last 72 hours. Thyroid function studies: No results for input(s): TSH, T4TOTAL,  T3FREE, THYROIDAB in the last 72 hours.  Invalid input(s): FREET3 Anemia work up: No results for input(s): VITAMINB12, FOLATE, FERRITIN, TIBC, IRON, RETICCTPCT in the last 72 hours. Sepsis Labs: Recent Labs  Lab 07/29/19 1409 07/30/19 0415 07/31/19 0508 08/01/19 0346  WBC 14.9* 13.9* 16.9* 17.6*    Microbiology Recent Results (from  the past 240 hour(s))  SARS CORONAVIRUS 2 (TAT 6-24 HRS) Nasopharyngeal Nasopharyngeal Swab     Status: None   Collection Time: 07/29/19  7:33 PM   Specimen: Nasopharyngeal Swab  Result Value Ref Range Status   SARS Coronavirus 2 NEGATIVE NEGATIVE Final    Comment: (NOTE) SARS-CoV-2 target nucleic acids are NOT DETECTED. The SARS-CoV-2 RNA is generally detectable in upper and lower respiratory specimens during the acute phase of infection. Negative results do not preclude SARS-CoV-2 infection, do not rule out co-infections with other pathogens, and should not be used as the sole basis for treatment or other patient management decisions. Negative results must be combined with clinical observations, patient history, and epidemiological information. The expected result is Negative. Fact Sheet for Patients: SugarRoll.be Fact Sheet for Healthcare Providers: https://www.woods-mathews.com/ This test is not yet approved or cleared by the [place] FDA and  has been authorized for detection and/or diagnosis of SARS-CoV-2 by FDA under an Emergency Use Authorization (EUA). This EUA will remain  in effect (meaning this test can be used) for the duration of the COVID-19 declaration under Section 56 4(b)(1) of the Act, 21 U.S.C. section 360bbb-3(b)(1), unless the authorization is terminated or revoked sooner. Performed at [facility], [place] [address] [place], [place] [address]     Procedures and diagnostic studies:  US Abdomen Limited  Result Date: 07/31/2019 CLINICAL DATA:  49 year old male with  abdominal distension. Alcoholic hepatitis and jaundice. Evaluate for ascites. EXAM: LIMITED ABDOMEN ULTRASOUND FOR ASCITES TECHNIQUE: Limited ultrasound survey for ascites was performed in all four abdominal quadrants. COMPARISON:  Right upper quadrant ultrasound dated 07/21/2019. FINDINGS: There is a very small ascites in the lower abdomen. There is diffuse increased liver echogenicity most commonly seen in the setting of fatty infiltration. Superimposed inflammation or fibrosis is not excluded. Clinical correlation is recommended. IMPRESSION: Small ascites. Electronically Signed   By: Anner Crete M.D.   On: 07/31/2019 17:48    Medications:   . enoxaparin (LOVENOX) injection  40 mg Subcutaneous Q24H  . folic acid  1 mg Oral Daily  . lactulose  10 g Oral BID  . multivitamin with minerals  1 tablet Oral Daily  . phytonadione  10 mg Subcutaneous Daily  . tamsulosin  0.4 mg Oral QPC supper  . thiamine  100 mg Oral Daily  . zinc sulfate  220 mg Oral Daily   Continuous Infusions: . albumin human 25 g (08/01/19 0646)     LOS: 3 days   [facility]   How to contact the [facility] Attending or Consulting provider [place] or covering provider during after hours [place], for this patient?  1. Check the care team in [facility] and look for a) attending/consulting TRH provider listed and b) the [facility] team listed 2. Log into [email or web address] and use [place]'s universal password to access. If you do not have the password, please contact the hospital operator. 3. Locate the [facility] provider you are looking for under [facility] and page to a number that you can be directly reached. 4. If you still have difficulty reaching the provider, please page the [facility] (Director on Call) for the Hospitalists listed on amion for assistance.  08/01/2019, 12:39 PM

## 2019-08-02 DIAGNOSIS — K7200 Acute and subacute hepatic failure without coma: Secondary | ICD-10-CM | POA: Diagnosis not present

## 2019-08-02 DIAGNOSIS — K7010 Alcoholic hepatitis without ascites: Secondary | ICD-10-CM | POA: Diagnosis not present

## 2019-08-02 LAB — HEPATIC FUNCTION PANEL
ALT: 25 U/L (ref 0–44)
AST: 93 U/L — ABNORMAL HIGH (ref 15–41)
Albumin: 2.3 g/dL — ABNORMAL LOW (ref 3.5–5.0)
Alkaline Phosphatase: 115 U/L (ref 38–126)
Bilirubin, Direct: 3.5 mg/dL — ABNORMAL HIGH (ref 0.0–0.2)
Indirect Bilirubin: 18.4 mg/dL — ABNORMAL HIGH (ref 0.3–0.9)
Total Bilirubin: 21.9 mg/dL (ref 0.3–1.2)
Total Protein: 5.1 g/dL — ABNORMAL LOW (ref 6.5–8.1)

## 2019-08-02 LAB — BASIC METABOLIC PANEL
Anion gap: 12 (ref 5–15)
BUN: 5 mg/dL — ABNORMAL LOW (ref 6–20)
CO2: 24 mmol/L (ref 22–32)
Calcium: 8.1 mg/dL — ABNORMAL LOW (ref 8.9–10.3)
Chloride: 95 mmol/L — ABNORMAL LOW (ref 98–111)
Creatinine, Ser: 0.75 mg/dL (ref 0.61–1.24)
GFR calc Af Amer: >60 mL/min (ref >60)
GFR calc non Af Amer: >60 mL/min (ref >60)
Glucose, Bld: 77 mg/dL (ref 70–99)
Potassium: 3.2 mmol/L — ABNORMAL LOW (ref 3.5–5.1)
Sodium: 131 mmol/L — ABNORMAL LOW (ref 135–145)

## 2019-08-02 LAB — CBC
HCT: 28.6 % — ABNORMAL LOW (ref 39.0–52.0)
Hemoglobin: 10.2 g/dL — ABNORMAL LOW (ref 13.0–17.0)
MCH: 36.7 pg — ABNORMAL HIGH (ref 26.0–34.0)
MCHC: 35.7 g/dL (ref 30.0–36.0)
MCV: 102.9 fL — ABNORMAL HIGH (ref 80.0–100.0)
Platelets: 463 10*3/uL — ABNORMAL HIGH (ref 150–400)
RBC: 2.78 MIL/uL — ABNORMAL LOW (ref 4.22–5.81)
RDW: 15.6 % — ABNORMAL HIGH (ref 11.5–15.5)
WBC: 17 10*3/uL — ABNORMAL HIGH (ref 4.0–10.5)
nRBC: 0 % (ref 0.0–0.2)

## 2019-08-02 LAB — HEPATITIS B DNA, ULTRAQUANTITATIVE, PCR
HBV DNA SERPL PCR-ACNC: NOT DETECTED IU/mL
HBV DNA SERPL PCR-LOG IU: UNDETERMINED log10 IU/mL

## 2019-08-02 LAB — PROTIME-INR
INR: 1.5 — ABNORMAL HIGH (ref 0.8–1.2)
Prothrombin Time: 17.8 seconds — ABNORMAL HIGH (ref 11.4–15.2)

## 2019-08-02 MED ORDER — POTASSIUM CHLORIDE CRYS ER 20 MEQ PO TBCR
40.0000 meq | EXTENDED_RELEASE_TABLET | Freq: Once | ORAL | Status: AC
  Administered 2019-08-02: 40 meq via ORAL
  Filled 2019-08-02: qty 2

## 2019-08-02 NOTE — Progress Notes (Signed)
Progress Note    Christopher Carrillo  GMW:[id number] DOB: [birth year]  DOA: 07/29/2019 PCP: Alycia Rossetti, MD    Brief Narrative:     Medical records reviewed and are as summarized below:  Christopher Carrillo is an 49 y.o. male with medical history significant for Hx of alcoholic hepatitis, psoriasis, hypertension, hyperlipidemia, anxiety and depression who presents due to worsening jaundice, fatigue, confusion and new lower extremity edema.   Patient was recently admitted from 4/13-4/18 and was treated for colitis and was found to have drug induced hepatitis and urinary obstruction. He was discharged with bilirubin of 12 with outpatient follow up with GI. He was then referral to [facility] center in [place] due to MELD score of 24.  He was following [facility] previously for colonic mass and recently underwent biopsy during colonoscopy on 3/31 that shows benign mucosa and thought to be due to intermittent ileocecal intussusception.   Plan during this hospitalization is continued work-up of liver issues including a liver biopsy scheduled for 4/26.    Assessment/Plan:   Active Problems:   Essential hypertension   Abdominal pain   Elevated bilirubin   Alcoholic hepatitis without ascites   Lower extremity edema   Urinary hesitancy   Anemia of chronic disease   Acute liver failure without hepatic coma   Severe alcoholic hepatitis/worsening bilirubin/fatigue/confusion GI thinks he has hallmarks of alcoholic hepatitis in the past but has had extensive negative studies. He was referred to transplant center in [place] and awaiting appointment. GI consult appreciated: Plan for liver biopsy on Monday  Ammonia only mildly elevated- lactulose started Blood pressure lower end of normal  -have ordered a limited U/S for ascites-very minimal found  LE edema Likely due to worsening liver disease and hypoalbuminemia  Encourage patient to elevate legs  Right sided abdominal  pain Overall benign exam  Could be due to hepatomegaly  Has hx of ileoceal intussusception   Continue PRN pain management w/o tylenol  Urinary hesitancy Could be due to increase abdominal distention Bladder scan showed around 400cc. Normal creatinine Continue Flomax Continue with outpatient urology follow up   Patient does not have thrombocytopenia  Hyponatremia -Monitor -May need fluid restriction  Anemia of chronic disease Stable   Hypertension Stable   Chronic alcoholism Reports no alcohol for the past month Continue folic and thiamine     Family Communication/Anticipated D/C date and plan/Code Status   DVT prophylaxis: Lovenox ordered. Code Status: Full Code.  Family Communication: Wife at bedside 4/25 Disposition Plan: Status is: Inpatient  Remains inpatient appropriate because:Inpatient level of care appropriate due to severity of illness   Dispo: The patient is from: Home              Anticipated d/c is to: Home              Anticipated d/c date is: 3 days              Patient currently is not medically stable to d/c.  Needs further GI work up, including liver biopsy on Monday         Medical Consultants:    GI  IR   Subjective:   Up playing cards with family  Objective:    Vitals:   08/01/19 1344 08/01/19 1926 08/02/19 0340 08/02/19 0824  BP: 118/69 116/69 119/75 120/65  Pulse: 98 (!) 109 98 (!) 106  Resp: 18 15 14 18   Temp: 98 F (36.7 C) 98.4 F (36.9 C) 98.3 F (  36.8 C) 98.2 F (36.8 C)  TempSrc: Oral Oral Oral Oral  SpO2: 100% 97% 93% 98%  Weight:      Height:        Intake/Output Summary (Last 24 hours) at 08/02/2019 1246 Last data filed at 08/02/2019 0900 Gross per 24 hour  Intake 360 ml  Output 700 ml  Net -340 ml   Filed Weights   07/29/19 1417  Weight: 88.5 kg    Exam: Sitting in a chair playing cards Markedly jaundiced Diminished bowel sounds, distended abdomen, nontender Positive lower extremity  edema Alert and oriented x3 No tremor noted while holding cards   Data Reviewed:   I have personally reviewed following labs and imaging studies:  Labs: Labs show the following:   Basic Metabolic Panel: Recent Labs  Lab 07/29/19 1409 07/29/19 1409 07/30/19 0415 07/30/19 0415 07/31/19 0508 07/31/19 0508 08/01/19 0346 08/02/19 0558  NA 130*  --  129*  --  126*  --  127* 131*  K 3.5   < > 3.4*   < > 3.4*   < > 3.2* 3.2*  CL 93*  --  93*  --  92*  --  93* 95*  CO2 26  --  26  --  22  --  23 24  GLUCOSE 104*  --  99  --  101*  --  91 77  BUN 5*  --  5*  --  7  --  5* 5*  CREATININE 0.74  --  0.76  --  0.89  --  0.70 0.75  CALCIUM 8.2*  --  7.8*  --  7.6*  --  8.1* 8.1*   < > = values in this interval not displayed.   GFR Estimated Creatinine Clearance: 125.6 mL/min (by C-G formula based on SCr of 0.75 mg/dL). Liver Function Tests: Recent Labs  Lab 07/29/19 1409 07/30/19 0415 07/31/19 0508 08/01/19 0346 08/02/19 0558  AST 143* 115* 105* 111* 93*  ALT 40 30 35 30 25  ALKPHOS 228* 173* 141* 134* 115  BILITOT 19.0* 16.0* 18.0* 21.4* 21.9*  PROT 6.0* 4.9* 4.9* 5.3* 5.1*  ALBUMIN 1.9* 1.5* 1.8* 2.5* 2.3*   Recent Labs  Lab 07/29/19 1409  LIPASE 72*   Recent Labs  Lab 07/30/19 0832  AMMONIA 39*   Coagulation profile Recent Labs  Lab 07/29/19 1840 07/31/19 0508 08/01/19 0346 08/02/19 0558  INR 1.3* 1.5* 1.5* 1.5*    CBC: Recent Labs  Lab 07/29/19 1409 07/30/19 0415 07/31/19 0508 08/01/19 0346 08/02/19 0558  WBC 14.9* 13.9* 16.9* 17.6* 17.0*  HGB 12.4* 11.3* 10.2* 10.6* 10.2*  HCT 35.9* 32.0* 29.3* 29.8* 28.6*  MCV 106.5* 103.6* 104.6* 103.1* 102.9*  PLT 590* 488* 474* 477* 463*   Cardiac Enzymes: No results for input(s): CKTOTAL, CKMB, CKMBINDEX, TROPONINI in the last 168 hours. BNP (last 3 results) No results for input(s): PROBNP in the last 8760 hours. CBG: No results for input(s): GLUCAP in the last 168 hours. D-Dimer: No results for  input(s): DDIMER in the last 72 hours. Hgb A1c: No results for input(s): HGBA1C in the last 72 hours. Lipid Profile: No results for input(s): CHOL, HDL, LDLCALC, TRIG, CHOLHDL, LDLDIRECT in the last 72 hours. Thyroid function studies: No results for input(s): TSH, T4TOTAL, T3FREE, THYROIDAB in the last 72 hours.  Invalid input(s): FREET3 Anemia work up: No results for input(s): VITAMINB12, FOLATE, FERRITIN, TIBC, IRON, RETICCTPCT in the last 72 hours. Sepsis Labs: Recent Labs  Lab 07/30/19 0415 07/31/19 9470  08/01/19 0346 08/02/19 0558  WBC 13.9* 16.9* 17.6* 17.0*    Microbiology Recent Results (from the past 240 hour(s))  SARS CORONAVIRUS 2 (TAT 6-24 HRS) Nasopharyngeal Nasopharyngeal Swab     Status: None   Collection Time: 07/29/19  7:33 PM   Specimen: Nasopharyngeal Swab  Result Value Ref Range Status   SARS Coronavirus 2 NEGATIVE NEGATIVE Final    Comment: (NOTE) SARS-CoV-2 target nucleic acids are NOT DETECTED. The SARS-CoV-2 RNA is generally detectable in upper and lower respiratory specimens during the acute phase of infection. Negative results do not preclude SARS-CoV-2 infection, do not rule out co-infections with other pathogens, and should not be used as the sole basis for treatment or other patient management decisions. Negative results must be combined with clinical observations, patient history, and epidemiological information. The expected result is Negative. Fact Sheet for Patients: SugarRoll.be Fact Sheet for Healthcare Providers: https://www.woods-mathews.com/ This test is not yet approved or cleared by the [place] FDA and  has been authorized for detection and/or diagnosis of SARS-CoV-2 by FDA under an Emergency Use Authorization (EUA). This EUA will remain  in effect (meaning this test can be used) for the duration of the COVID-19 declaration under Section 56 4(b)(1) of the Act, 21 U.S.C. section  360bbb-3(b)(1), unless the authorization is terminated or revoked sooner. Performed at [facility], [place] [address], [place], [place] [address]     Procedures and diagnostic studies:  US Abdomen Limited  Result Date: 07/31/2019 CLINICAL DATA:  49 year old male with abdominal distension. Alcoholic hepatitis and jaundice. Evaluate for ascites. EXAM: LIMITED ABDOMEN ULTRASOUND FOR ASCITES TECHNIQUE: Limited ultrasound survey for ascites was performed in all four abdominal quadrants. COMPARISON:  Right upper quadrant ultrasound dated 07/21/2019. FINDINGS: There is a very small ascites in the lower abdomen. There is diffuse increased liver echogenicity most commonly seen in the setting of fatty infiltration. Superimposed inflammation or fibrosis is not excluded. Clinical correlation is recommended. IMPRESSION: Small ascites. Electronically Signed   By: Anner Crete M.D.   On: 07/31/2019 17:48    Medications:   . folic acid  1 mg Oral Daily  . lactulose  10 g Oral BID  . multivitamin with minerals  1 tablet Oral Daily  . tamsulosin  0.4 mg Oral QPC supper  . thiamine  100 mg Oral Daily  . zinc sulfate  220 mg Oral Daily   Continuous Infusions: . albumin human 25 g (08/02/19 2778)     LOS: 4 days   Geradine Girt  [facility]   How to contact the [facility] Attending or Consulting provider [place] or covering provider during after hours [place], for this patient?  1. Check the care team in [facility] and look for a) attending/consulting TRH provider listed and b) the [facility] team listed 2. Log into [email or web address] and use [place]'s universal password to access. If you do not have the password, please contact the hospital operator. 3. Locate the [facility] provider you are looking for under [facility] and page to a number that you can be directly reached. 4. If you still have difficulty reaching the provider, please page the [facility] (Director on Call) for the Hospitalists listed on amion  for assistance.  08/02/2019, 12:46 PM

## 2019-08-02 NOTE — Plan of Care (Signed)

## 2019-08-02 NOTE — Plan of Care (Signed)
  Problem: Education: Goal: Knowledge of General Education information will improve Description: Including pain rating scale, medication(s)/side effects and non-pharmacologic comfort measures Outcome: Progressing   Problem: Health Behavior/Discharge Planning: Goal: Ability to manage health-related needs will improve Outcome: Progressing   Problem: Clinical Measurements: Goal: Ability to maintain clinical measurements within normal limits will improve Outcome: Progressing Goal: Diagnostic test results will improve Outcome: Progressing Goal: Cardiovascular complication will be avoided Outcome: Progressing   Problem: Activity: Goal: Risk for activity intolerance will decrease Outcome: Progressing   Problem: Coping: Goal: Level of anxiety will decrease Outcome: Progressing   Problem: Pain Managment: Goal: General experience of comfort will improve Outcome: Progressing   Problem: Safety: Goal: Ability to remain free from injury will improve Outcome: Progressing   Problem: Skin Integrity: Goal: Risk for impaired skin integrity will decrease Outcome: Progressing

## 2019-08-02 NOTE — Progress Notes (Addendum)
Daily Rounding Note  08/02/2019, 10:41 AM  LOS: 4 days   SUBJECTIVE:   Chief complaint: Jaundice, hepatomegaly, hepatitis, abdominal pain.  Jaundice continues.  Pain in right upper quadrant persists but controlled with oral narcotics.  Eating well.  Urine is now yellow rather than amber-colored.  Overall feels a bit better.  Walking in the hallways without issues.  OBJECTIVE:         Vital signs in last 24 hours:    Temp:  [98 F (36.7 C)-98.4 F (36.9 C)] 98.2 F (36.8 C) (04/25 0824) Pulse Rate:  [98-109] 106 (04/25 0824) Resp:  [14-18] 18 (04/25 0824) BP: (116-120)/(65-75) 120/65 (04/25 0824) SpO2:  [93 %-100 %] 98 % (04/25 0824) Last BM Date: 08/01/19 Filed Weights   07/29/19 1417  Weight: 88.5 kg   General: Jaundiced, does not look acutely ill other than the jaundice.  Comfortable. Heart: RRR. Chest: Clear bilaterally.  No labored breathing or cough. Abdomen: Distended, soft, some tenderness in right upper quadrant without guarding or rebound.  Active bowel sounds. Extremities: Lower extremity edema. Neuro/Psych: No asterixis, no tremor.  Not confused.  Psychomotor slowing much less apparent, pretty much resolved.  Intake/Output from previous day: 04/24 0701 - 04/25 0700 In: 600 [P.O.:600] Out: 1400 [Urine:1400]  Intake/Output this shift: Total I/O In: 120 [P.O.:120] Out: -   Lab Results: Recent Labs    07/31/19 0508 08/01/19 0346 08/02/19 0558  WBC 16.9* 17.6* 17.0*  HGB 10.2* 10.6* 10.2*  HCT 29.3* 29.8* 28.6*  PLT 474* 477* 463*   BMET Recent Labs    07/31/19 0508 08/01/19 0346 08/02/19 0558  NA 126* 127* 131*  K 3.4* 3.2* 3.2*  CL 92* 93* 95*  CO2 22 23 24   GLUCOSE 101* 91 77  BUN 7 5* 5*  CREATININE 0.89 0.70 0.75  CALCIUM 7.6* 8.1* 8.1*   LFT Recent Labs    07/31/19 0508 08/01/19 0346 08/02/19 0558  PROT 4.9* 5.3* 5.1*  ALBUMIN 1.8* 2.5* 2.3*  AST 105* 111* 93*  ALT 35  30 25  ALKPHOS 141* 134* 115  BILITOT 18.0* 21.4* 21.9*  BILIDIR  --   --  3.5*  IBILI  --   --  18.4*   PT/INR Recent Labs    08/01/19 0346 08/02/19 0558  LABPROT 18.0* 17.8*  INR 1.5* 1.5*   Hepatitis Panel No results for input(s): HEPBSAG, HCVAB, HEPAIGM, HEPBIGM in the last 72 hours.  Studies/Results: US Abdomen Limited  Result Date: 07/31/2019 CLINICAL DATA:  49 year old male with abdominal distension. Alcoholic hepatitis and jaundice. Evaluate for ascites. EXAM: LIMITED ABDOMEN ULTRASOUND FOR ASCITES TECHNIQUE: Limited ultrasound survey for ascites was performed in all four abdominal quadrants. COMPARISON:  Right upper quadrant ultrasound dated 07/21/2019. FINDINGS: There is a very small ascites in the lower abdomen. There is diffuse increased liver echogenicity most commonly seen in the setting of fatty infiltration. Superimposed inflammation or fibrosis is not excluded. Clinical correlation is recommended. IMPRESSION: Small ascites. Electronically Signed   By: Anner Crete M.D.   On: 07/31/2019 17:48    ASSESMENT:   * Acute hepatitis, hepatomegaly. Likelyfrom ETOH, ? DILI?Marland Kitchen What had been a background of mildly elevated LFTs and fatty liver became acutely worse within 24 hours of colonoscopy and propofol sedation. Suspect the patient's hepatitis/hepatomegaly is the source of his right upper quadrant pain. MELD 20. Disc fx score 34.6 Acute hepatitis serologies and autoimmune markers unrevealing GGT,pending IV Albumin 25g q 6 hours  day 3.   WBCs, T bili still elevated, alk phos and AST improved, ALT was not elevated.  Fractionation of bilirubin shows predominance of indirect.  *Coagulopathy. INR 1.5, stable. Day 3/3 IV Vit K.  * Abnormal CT abdomen pelvis. FOBT positive stool. 07/08/19 colonoscopy: bx of IC mass (path benign), 5 mm descending polyp (TA) removed.   *   Small volume ascites per 4/23 ultrasound.    * Hyponatremia. 127 >> 131 in  last 24 hours.    *Hypokalemia. Persists.    * Anemia.macrocytosis. Hgb stable.    * HE, mild. Ammonia 39.Lactulose in place. Less psycho-motor slowing and less tremor/asterixis in hands.      PLAN   *    Liver biopsy is ordered for tomorrow.  Lovenox on hold to allow for safe biopsy.    Azucena Freed  08/02/2019, 10:41 AM Phone [phone]    Attending physician's note   I have taken an interval history, reviewed the chart and examined the patient. I agree with the Advanced Practitioner's note, impression and recommendations.   Acute hepatitis on chronic liver disease.  ? D/t ETOH/DILI/infiltrative. AST:ALT ratio c/w ETOH. Neg acute hep panel, ANA, ASMA, Nl cerruloplasmin, elevated ferritin but Nl iron studies. Nl LDH. Elevated GGT. [place] 4/13- fatty liver with Nl doppler, no ascites, CT- severe fatty liver. MELD Na: 25  DF: 21.5 (steroids not indicated)on adm. Not immune to A/B. Hep B ultraquant-Pending. AST trending down. TB appears to have peaked. Subclinical hepatic encephalopathy Coagulopathy.  Stable PT/INR.  Given vitamin K x 3 days. Stable renal function.  Plan: -Liver bx in AM -Continue IV alb. -Continue lactulose/Zn. (not added rifaximin) -Continue folic acid/thiamine. -Hep A/B vaccine as outpatient -D/W patient and patient's wife. -Dr. Bryan Lemma taking over service from tomorrow.  Carmell Austria, MD Velora Heckler Fabienne Bruns [phone].

## 2019-08-03 ENCOUNTER — Inpatient Hospital Stay (HOSPITAL_COMMUNITY): Payer: 59

## 2019-08-03 DIAGNOSIS — K7030 Alcoholic cirrhosis of liver without ascites: Secondary | ICD-10-CM

## 2019-08-03 DIAGNOSIS — D689 Coagulation defect, unspecified: Secondary | ICD-10-CM

## 2019-08-03 DIAGNOSIS — B179 Acute viral hepatitis, unspecified: Secondary | ICD-10-CM

## 2019-08-03 DIAGNOSIS — R17 Unspecified jaundice: Secondary | ICD-10-CM

## 2019-08-03 LAB — COMPREHENSIVE METABOLIC PANEL
ALT: 26 U/L (ref 0–44)
AST: 84 U/L — ABNORMAL HIGH (ref 15–41)
Albumin: 2.7 g/dL — ABNORMAL LOW (ref 3.5–5.0)
Alkaline Phosphatase: 110 U/L (ref 38–126)
Anion gap: 12 (ref 5–15)
BUN: 7 mg/dL (ref 6–20)
CO2: 22 mmol/L (ref 22–32)
Calcium: 8.1 mg/dL — ABNORMAL LOW (ref 8.9–10.3)
Chloride: 95 mmol/L — ABNORMAL LOW (ref 98–111)
Creatinine, Ser: 0.81 mg/dL (ref 0.61–1.24)
GFR calc Af Amer: >60 mL/min (ref >60)
GFR calc non Af Amer: >60 mL/min (ref >60)
Glucose, Bld: 94 mg/dL (ref 70–99)
Potassium: 3.6 mmol/L (ref 3.5–5.1)
Sodium: 129 mmol/L — ABNORMAL LOW (ref 135–145)
Total Bilirubin: 23.8 mg/dL (ref 0.3–1.2)
Total Protein: 5.4 g/dL — ABNORMAL LOW (ref 6.5–8.1)

## 2019-08-03 LAB — CBC WITH DIFFERENTIAL/PLATELET
Abs Immature Granulocytes: 0.13 10*3/uL — ABNORMAL HIGH (ref 0.00–0.07)
Basophils Absolute: 0.1 10*3/uL (ref 0.0–0.1)
Basophils Relative: 1 %
Eosinophils Absolute: 0.1 10*3/uL (ref 0.0–0.5)
Eosinophils Relative: 1 %
HCT: 27.4 % — ABNORMAL LOW (ref 39.0–52.0)
Hemoglobin: 9.7 g/dL — ABNORMAL LOW (ref 13.0–17.0)
Immature Granulocytes: 1 %
Lymphocytes Relative: 9 %
Lymphs Abs: 1.4 10*3/uL (ref 0.7–4.0)
MCH: 36.5 pg — ABNORMAL HIGH (ref 26.0–34.0)
MCHC: 35.4 g/dL (ref 30.0–36.0)
MCV: 103 fL — ABNORMAL HIGH (ref 80.0–100.0)
Monocytes Absolute: 1.7 10*3/uL — ABNORMAL HIGH (ref 0.1–1.0)
Monocytes Relative: 11 %
Neutro Abs: 12.2 10*3/uL — ABNORMAL HIGH (ref 1.7–7.7)
Neutrophils Relative %: 77 %
Platelets: 455 10*3/uL — ABNORMAL HIGH (ref 150–400)
RBC: 2.66 MIL/uL — ABNORMAL LOW (ref 4.22–5.81)
RDW: 15.7 % — ABNORMAL HIGH (ref 11.5–15.5)
WBC: 15.6 10*3/uL — ABNORMAL HIGH (ref 4.0–10.5)
nRBC: 0 % (ref 0.0–0.2)

## 2019-08-03 LAB — PROTIME-INR
INR: 1.5 — ABNORMAL HIGH (ref 0.8–1.2)
Prothrombin Time: 18.2 seconds — ABNORMAL HIGH (ref 11.4–15.2)

## 2019-08-03 MED ORDER — MIDAZOLAM HCL 2 MG/2ML IJ SOLN
INTRAMUSCULAR | Status: AC
  Filled 2019-08-03: qty 2

## 2019-08-03 MED ORDER — LIDOCAINE HCL (PF) 1 % IJ SOLN
INTRAMUSCULAR | Status: AC
  Filled 2019-08-03: qty 30

## 2019-08-03 MED ORDER — MIDAZOLAM HCL 2 MG/2ML IJ SOLN
INTRAMUSCULAR | Status: AC | PRN
  Administered 2019-08-03: 1 mg via INTRAVENOUS

## 2019-08-03 MED ORDER — FENTANYL CITRATE (PF) 100 MCG/2ML IJ SOLN
INTRAMUSCULAR | Status: AC | PRN
  Administered 2019-08-03: 50 ug via INTRAVENOUS

## 2019-08-03 MED ORDER — FENTANYL CITRATE (PF) 100 MCG/2ML IJ SOLN
INTRAMUSCULAR | Status: AC
  Filled 2019-08-03: qty 2

## 2019-08-03 NOTE — Progress Notes (Signed)
Progress Note   Subjective  Chief Complaint: Jaundice, Hepatitis, Abdominal Pain  S/p liver biopsy today, he is found with his wife by his bedside, patient tells me that he is having slightly more discomfort in the right upper quadrant especially when he changes position.  Tells me also that he has been having a lot of liquid stools ever since returning from his biopsy.  He is not sure if this is related to the biopsy itself or the fact that all of the "medicines they have been giving me" have kicked in.  Denies any new concerns or complaints.    Objective   Vital signs in last 24 hours: Temp:  [98 F (36.7 C)-99 F (37.2 C)] 98.2 F (36.8 C) (04/26 1355) Pulse Rate:  [90-107] 90 (04/26 1355) Resp:  [14-19] 16 (04/26 1355) BP: (97-127)/(61-74) 105/71 (04/26 1355) SpO2:  [95 %-100 %] 100 % (04/26 1355) Last BM Date: 08/02/19 General:   Jaundiced white male in NAD Heart:  Regular rate and rhythm; no murmurs Lungs: Respirations even and unlabored, lungs CTA bilaterally Abdomen:  Soft, RUQ ttp  and nondistended. Normal bowel sounds. Extremities:  Without edema. Neurologic:  Alert and oriented,  grossly normal neurologically. Psych:  Cooperative. Normal mood and affect.  Intake/Output from previous day: 04/25 0701 - 04/26 0700 In: 600 [P.O.:600] Out: -   Lab Results: Recent Labs    08/01/19 0346 08/02/19 0558 08/03/19 0332  WBC 17.6* 17.0* 15.6*  HGB 10.6* 10.2* 9.7*  HCT 29.8* 28.6* 27.4*  PLT 477* 463* 455*   BMET Recent Labs    08/01/19 0346 08/02/19 0558 08/03/19 0332  NA 127* 131* 129*  K 3.2* 3.2* 3.6  CL 93* 95* 95*  CO2 23 24 22   GLUCOSE 91 77 94  BUN 5* 5* 7  CREATININE 0.70 0.75 0.81  CALCIUM 8.1* 8.1* 8.1*   LFT Recent Labs    08/02/19 0558 08/02/19 0558 08/03/19 0332  PROT 5.1*   < > 5.4*  ALBUMIN 2.3*   < > 2.7*  AST 93*   < > 84*  ALT 25   < > 26  ALKPHOS 115   < > 110  BILITOT 21.9*   < > 23.8*  BILIDIR 3.5*  --   --   IBILI 18.4*   --   --    < > = values in this interval not displayed.   PT/INR Recent Labs    08/02/19 0558 08/03/19 0332  LABPROT 17.8* 18.2*  INR 1.5* 1.5*    Studies/Results: US BIOPSY (LIVER)  Result Date: 08/03/2019 CLINICAL DATA:  Jaundice, worsening bilirubin EXAM: ULTRASOUND-GUIDED CORE LIVER BIOPSY TECHNIQUE: An ultrasound guided liver biopsy was thoroughly discussed with the patient and questions were answered. The benefits, risks, alternatives, and complications were also discussed. The patient understands and wishes to proceed with the procedure. A verbal as well as written consent was obtained. Survey ultrasound of the liver was performed and an appropriate skin entry site was determined. Skin site was marked, prepped with chlorhexidine, and draped in usual sterile fashion, and infiltrated locally with 1% lidocaine. Intravenous Fentanyl 75mg and Versed 154mwere administered as conscious sedation during continuous monitoring of the patient's level of consciousness and physiological / cardiorespiratory status by the radiology RN, with a total moderate sedation time of 10 minutes. A 17 gauge trocar needle was advanced under ultrasound guidance into the liver. 3 solid-appearing coaxial 18gauge core samples were then obtained through the guide needle. The guide needle  was removed. Post procedure scans demonstrate no apparent complication. COMPLICATIONS: COMPLICATIONS None immediate FINDINGS: Only trace abdominal ascites was noted in the left lower quadrant of the abdomen. No focal liver lesion identified on survey interrogation. Core biopsy samples of the right lobe obtained as above. IMPRESSION: 1. Technically successful ultrasound guided core liver biopsy. Electronically Signed   By: Lucrezia Europe M.D.   On: 08/03/2019 10:57    Assessment / Plan:   Assessment: 1. Acute hepatitis on chronic liver disease: ? D/t ETOH/DILI/infiltative, AST:ALT ratio c/w ETOH, neg hep panel, ANA, ASMA, Ceruloplasmin,  elevated ferritin but NL iron studies, nl LDH, Elevated GGT, [place] 4/13 fatty liver with nl doppler, no ascites, ct-severe fatty liver; MELD Na 25, DF 21.5 (steroids not indicated), not immune to A/B, AST trending down, Tbili inc overnight 21.9-->23.8 2. Subclinical hepatic encephalopathy 3. Coagulopathy: stable PT/INR, given Vit K x3 days  Plan: 1.  S/p liver bx today-await results 2.  Continue lactulose 3. Continue folic acid/thiamine 4. Hep A/B vaccine outpatient 5.  Please await further recommendations from Dr. Bryan Lemma later today  Thank you for your kind consultation, we will continue to follow.   LOS: 5 days   Levin Erp  08/03/2019, 3:39 PM

## 2019-08-03 NOTE — Progress Notes (Signed)
Progress Note    Deaundra Kutzer  WER:[id number] DOB: 24-May-1970  DOA: 07/29/2019 PCP: Alycia Rossetti, MD    Brief Narrative:     Medical records reviewed and are as summarized below:  Zen Cedillos is an 49 y.o. male with medical history significant for Hx of alcoholic hepatitis, psoriasis, hypertension, hyperlipidemia, anxiety and depression who presents due to worsening jaundice, fatigue, confusion and new lower extremity edema.   Patient was recently admitted from 4/13-4/18 and was treated for colitis and was found to have drug induced hepatitis and urinary obstruction. He was discharged with bilirubin of 12 with outpatient follow up with GI. He was then referral to [facility] transplant center in [place] due to MELD score of 24.  He was following [facility] previously for colonic mass and recently underwent biopsy during colonoscopy on 3/31 that shows benign mucosa and thought to be due to intermittent ileocecal intussusception.   Plan during this hospitalization is continued work-up of liver issues including a liver biopsy scheduled for 4/26.    Assessment/Plan:   Active Problems:   Essential hypertension   Abdominal pain   Elevated bilirubin   Alcoholic hepatitis without ascites   Lower extremity edema   Urinary hesitancy   Anemia of chronic disease   Acute liver failure without hepatic coma   Severe alcoholic hepatitis/worsening bilirubin/fatigue/confusion GI thinks he has hallmarks of alcoholic hepatitis in the past but has had extensive negative studies. He was referred to transplant center in [place] and awaiting appointment. GI consult appreciated: Status post liver biopsy  Ammonia only mildly elevated- lactulose started Blood pressure lower end of normal  -have ordered a limited U/S for ascites-very minimal found  LE edema Likely due to worsening liver disease and hypoalbuminemia  Encourage patient to elevate legs  Right sided abdominal  pain Overall benign exam  Could be due to hepatomegaly  Has hx of ileoceal intussusception   Continue PRN pain management w/o tylenol  Urinary hesitancy Could be due to increase abdominal distention Bladder scan showed around 400cc. Normal creatinine Continue Flomax Continue with outpatient urology follow up   Patient does not have thrombocytopenia  Hyponatremia -Monitor -May need fluid restriction  Anemia of chronic disease Stable   Hypertension Stable   Chronic alcoholism Reports no alcohol for the past month Continue folic and thiamine     Family Communication/Anticipated D/C date and plan/Code Status   DVT prophylaxis: Lovenox ordered. Code Status: Full Code.  Family Communication: Wife at bedside 4/25 Disposition Plan: Status is: Inpatient  Remains inpatient appropriate because:Inpatient level of care appropriate due to severity of illness   Dispo: The patient is from: Home              Anticipated d/c is to: Home              Anticipated d/c date is: 3 days              Patient currently is not medically stable to d/c.  Needs further GI work up has bilirubin continues to climb         Medical Consultants:    GI  IR   Subjective:   Just back from liver biopsy, no complaints  Objective:    Vitals:   08/03/19 1010 08/03/19 1015 08/03/19 1030 08/03/19 1047  BP: 97/62 105/66 100/74 109/69  Pulse: 92 92 96 96  Resp: 17 15 17 16   Temp:    98.2 F (36.8 C)  TempSrc:  Oral  SpO2: 98% 98% 98% 99%  Weight:      Height:        Intake/Output Summary (Last 24 hours) at 08/03/2019 1212 Last data filed at 08/03/2019 0800 Gross per 24 hour  Intake 480 ml  Output --  Net 480 ml   Filed Weights   07/29/19 1417  Weight: 88.5 kg    Exam:  General: Appearance:     Overweight jaundiced male in no acute distress     Lungs:     Clear to auscultation bilaterally, respirations unlabored  Heart:    Normal heart rate. Normal rhythm. No  murmurs, rubs, or gallops.   MS:   All extremities are intact.   Neurologic:   Awake, alert, oriented x 3. No apparent focal neurological           defect.      Data Reviewed:   I have personally reviewed following labs and imaging studies:  Labs: Labs show the following:   Basic Metabolic Panel: Recent Labs  Lab 07/30/19 0415 07/30/19 0415 07/31/19 0508 07/31/19 0508 08/01/19 0346 08/01/19 0346 08/02/19 0558 08/03/19 0332  NA 129*  --  126*  --  127*  --  131* 129*  K 3.4*   < > 3.4*   < > 3.2*   < > 3.2* 3.6  CL 93*  --  92*  --  93*  --  95* 95*  CO2 26  --  22  --  23  --  24 22  GLUCOSE 99  --  101*  --  91  --  77 94  BUN 5*  --  7  --  5*  --  5* 7  CREATININE 0.76  --  0.89  --  0.70  --  0.75 0.81  CALCIUM 7.8*  --  7.6*  --  8.1*  --  8.1* 8.1*   < > = values in this interval not displayed.   GFR Estimated Creatinine Clearance: 124.1 mL/min (by C-G formula based on SCr of 0.81 mg/dL). Liver Function Tests: Recent Labs  Lab 07/30/19 0415 07/31/19 0508 08/01/19 0346 08/02/19 0558 08/03/19 0332  AST 115* 105* 111* 93* 84*  ALT 30 35 30 25 26   ALKPHOS 173* 141* 134* 115 110  BILITOT 16.0* 18.0* 21.4* 21.9* 23.8*  PROT 4.9* 4.9* 5.3* 5.1* 5.4*  ALBUMIN 1.5* 1.8* 2.5* 2.3* 2.7*   Recent Labs  Lab 07/29/19 1409  LIPASE 72*   Recent Labs  Lab 07/30/19 0832  AMMONIA 39*   Coagulation profile Recent Labs  Lab 07/29/19 1840 07/31/19 0508 08/01/19 0346 08/02/19 0558 08/03/19 0332  INR 1.3* 1.5* 1.5* 1.5* 1.5*    CBC: Recent Labs  Lab 07/30/19 0415 07/31/19 0508 08/01/19 0346 08/02/19 0558 08/03/19 0332  WBC 13.9* 16.9* 17.6* 17.0* 15.6*  NEUTROABS  --   --   --   --  12.2*  HGB 11.3* 10.2* 10.6* 10.2* 9.7*  HCT 32.0* 29.3* 29.8* 28.6* 27.4*  MCV 103.6* 104.6* 103.1* 102.9* 103.0*  PLT 488* 474* 477* 463* 455*   Cardiac Enzymes: No results for input(s): CKTOTAL, CKMB, CKMBINDEX, TROPONINI in the last 168 hours. BNP (last 3  results) No results for input(s): PROBNP in the last 8760 hours. CBG: No results for input(s): GLUCAP in the last 168 hours. D-Dimer: No results for input(s): DDIMER in the last 72 hours. Hgb A1c: No results for input(s): HGBA1C in the last 72 hours. Lipid Profile: No results for input(s):  CHOL, HDL, LDLCALC, TRIG, CHOLHDL, LDLDIRECT in the last 72 hours. Thyroid function studies: No results for input(s): TSH, T4TOTAL, T3FREE, THYROIDAB in the last 72 hours.  Invalid input(s): FREET3 Anemia work up: No results for input(s): VITAMINB12, FOLATE, FERRITIN, TIBC, IRON, RETICCTPCT in the last 72 hours. Sepsis Labs: Recent Labs  Lab 07/31/19 0508 08/01/19 0346 08/02/19 0558 08/03/19 0332  WBC 16.9* 17.6* 17.0* 15.6*    Microbiology Recent Results (from the past 240 hour(s))  SARS CORONAVIRUS 2 (TAT 6-24 HRS) Nasopharyngeal Nasopharyngeal Swab     Status: None   Collection Time: 07/29/19  7:33 PM   Specimen: Nasopharyngeal Swab  Result Value Ref Range Status   SARS Coronavirus 2 NEGATIVE NEGATIVE Final    Comment: (NOTE) SARS-CoV-2 target nucleic acids are NOT DETECTED. The SARS-CoV-2 RNA is generally detectable in upper and lower respiratory specimens during the acute phase of infection. Negative results do not preclude SARS-CoV-2 infection, do not rule out co-infections with other pathogens, and should not be used as the sole basis for treatment or other patient management decisions. Negative results must be combined with clinical observations, patient history, and epidemiological information. The expected result is Negative. Fact Sheet for Patients: SugarRoll.be Fact Sheet for Healthcare Providers: https://www.woods-mathews.com/ This test is not yet approved or cleared by the [place] FDA and  has been authorized for detection and/or diagnosis of SARS-CoV-2 by FDA under an Emergency Use Authorization (EUA). This EUA will remain   in effect (meaning this test can be used) for the duration of the COVID-19 declaration under Section 56 4(b)(1) of the Act, 21 U.S.C. section 360bbb-3(b)(1), unless the authorization is terminated or revoked sooner. Performed at [facility], [place] [address] [place], [place] [address]     Procedures and diagnostic studies:  US BIOPSY (LIVER)  Result Date: 08/03/2019 CLINICAL DATA:  Jaundice, worsening bilirubin EXAM: ULTRASOUND-GUIDED CORE LIVER BIOPSY TECHNIQUE: An ultrasound guided liver biopsy was thoroughly discussed with the patient and questions were answered. The benefits, risks, alternatives, and complications were also discussed. The patient understands and wishes to proceed with the procedure. A verbal as well as written consent was obtained. Survey ultrasound of the liver was performed and an appropriate skin entry site was determined. Skin site was marked, prepped with chlorhexidine, and draped in usual sterile fashion, and infiltrated locally with 1% lidocaine. Intravenous Fentanyl 55mg and Versed 173mwere administered as conscious sedation during continuous monitoring of the patient's level of consciousness and physiological / cardiorespiratory status by the radiology RN, with a total moderate sedation time of 10 minutes. A 17 gauge trocar needle was advanced under ultrasound guidance into the liver. 3 solid-appearing coaxial 18gauge core samples were then obtained through the guide needle. The guide needle was removed. Post procedure scans demonstrate no apparent complication. COMPLICATIONS: COMPLICATIONS None immediate FINDINGS: Only trace abdominal ascites was noted in the left lower quadrant of the abdomen. No focal liver lesion identified on survey interrogation. Core biopsy samples of the right lobe obtained as above. IMPRESSION: 1. Technically successful ultrasound guided core liver biopsy. Electronically Signed   By: D Lucrezia Europe.D.   On: 08/03/2019 10:57     Medications:    fentaNYL       folic acid  1 mg Oral Daily   lactulose  10 g Oral BID   lidocaine (PF)       midazolam       multivitamin with minerals  1 tablet Oral Daily   tamsulosin  0.4 mg  Oral QPC supper   thiamine  100 mg Oral Daily   zinc sulfate  220 mg Oral Daily   Continuous Infusions:  albumin human 25 g (08/03/19 0509)     LOS: 5 days   Geradine Girt  [facility]   How to contact the [facility] Attending or Consulting provider [place] or covering provider during after hours [place], for this patient?  1. Check the care team in [facility] and look for a) attending/consulting [facility] provider listed and b) the [facility] team listed 2. Log into [email or web address] and use [place]'s universal password to access. If you do not have the password, please contact the hospital operator. 3. Locate the [facility] provider you are looking for under [facility] and page to a number that you can be directly reached. 4. If you still have difficulty reaching the provider, please page the [facility] (Director on Call) for the Hospitalists listed on amion for assistance.  08/03/2019, 12:12 PM

## 2019-08-03 NOTE — Plan of Care (Signed)

## 2019-08-03 NOTE — Procedures (Signed)
  Procedure: [place] core liver biopsy   EBL:   minimal Complications:  none immediate  See full dictation in [facility].  Dillard Cannon MD Main # [phone] Pager  [phone]

## 2019-08-04 ENCOUNTER — Other Ambulatory Visit: Payer: Self-pay

## 2019-08-04 ENCOUNTER — Other Ambulatory Visit: Payer: Self-pay | Admitting: Physician Assistant

## 2019-08-04 LAB — BASIC METABOLIC PANEL
Anion gap: 10 (ref 5–15)
BUN: 9 mg/dL (ref 6–20)
CO2: 22 mmol/L (ref 22–32)
Calcium: 8.3 mg/dL — ABNORMAL LOW (ref 8.9–10.3)
Chloride: 95 mmol/L — ABNORMAL LOW (ref 98–111)
Creatinine, Ser: 0.92 mg/dL (ref 0.61–1.24)
GFR calc Af Amer: >60 mL/min (ref >60)
GFR calc non Af Amer: >60 mL/min (ref >60)
Glucose, Bld: 85 mg/dL (ref 70–99)
Potassium: 3.4 mmol/L — ABNORMAL LOW (ref 3.5–5.1)
Sodium: 127 mmol/L — ABNORMAL LOW (ref 135–145)

## 2019-08-04 LAB — CBC
HCT: 29.6 % — ABNORMAL LOW (ref 39.0–52.0)
Hemoglobin: 10.2 g/dL — ABNORMAL LOW (ref 13.0–17.0)
MCH: 35.8 pg — ABNORMAL HIGH (ref 26.0–34.0)
MCHC: 34.5 g/dL (ref 30.0–36.0)
MCV: 103.9 fL — ABNORMAL HIGH (ref 80.0–100.0)
Platelets: 454 10*3/uL — ABNORMAL HIGH (ref 150–400)
RBC: 2.85 MIL/uL — ABNORMAL LOW (ref 4.22–5.81)
RDW: 15.5 % (ref 11.5–15.5)
WBC: 17.5 10*3/uL — ABNORMAL HIGH (ref 4.0–10.5)
nRBC: 0 % (ref 0.0–0.2)

## 2019-08-04 LAB — HEPATIC FUNCTION PANEL
ALT: 27 U/L (ref 0–44)
AST: 88 U/L — ABNORMAL HIGH (ref 15–41)
Albumin: 3.1 g/dL — ABNORMAL LOW (ref 3.5–5.0)
Alkaline Phosphatase: 105 U/L (ref 38–126)
Bilirubin, Direct: 17.2 mg/dL — ABNORMAL HIGH (ref 0.0–0.2)
Indirect Bilirubin: 9.2 mg/dL — ABNORMAL HIGH (ref 0.3–0.9)
Total Bilirubin: 26.4 mg/dL (ref 0.3–1.2)
Total Protein: 5.6 g/dL — ABNORMAL LOW (ref 6.5–8.1)

## 2019-08-04 LAB — PROTIME-INR
INR: 1.7 — ABNORMAL HIGH (ref 0.8–1.2)
Prothrombin Time: 18.9 seconds — ABNORMAL HIGH (ref 11.4–15.2)

## 2019-08-04 MED ORDER — POTASSIUM CHLORIDE CRYS ER 20 MEQ PO TBCR
40.0000 meq | EXTENDED_RELEASE_TABLET | Freq: Once | ORAL | Status: AC
  Administered 2019-08-04: 10:00:00 40 meq via ORAL
  Filled 2019-08-04: qty 2

## 2019-08-04 MED ORDER — PREDNISOLONE 5 MG PO TABS
40.0000 mg | ORAL_TABLET | Freq: Every day | ORAL | Status: DC
  Administered 2019-08-04 – 2019-08-06 (×3): 40 mg via ORAL
  Filled 2019-08-04 (×3): qty 8

## 2019-08-04 MED ORDER — HYDROMORPHONE HCL 1 MG/ML IJ SOLN
0.5000 mg | INTRAMUSCULAR | Status: DC | PRN
  Administered 2019-08-04 – 2019-08-06 (×6): 0.5 mg via INTRAVENOUS
  Filled 2019-08-04 (×6): qty 1

## 2019-08-04 NOTE — Progress Notes (Signed)
Progress Note   Subjective  Chief Complaint: Jaundice, hepatitis, abdominal pain  Patient reports no real change overnight.  He is aware that his bilirubin is increasing.   Objective   Vital signs in last 24 hours: Temp:  [97.9 F (36.6 C)-98.8 F (37.1 C)] 98 F (36.7 C) (04/27 0739) Pulse Rate:  [88-98] 98 (04/27 0739) Resp:  [16-19] 16 (04/27 0739) BP: (105-121)/(70-71) 108/70 (04/27 0739) SpO2:  [97 %-100 %] 98 % (04/27 0739) Last BM Date: 08/02/19 General:    Jaundiced white male in NAD Heart:  Regular rate and rhythm; no murmurs Lungs: Respirations even and unlabored, lungs CTA bilaterally Abdomen:  Soft, right upper quadrant TTP and nondistended. Normal bowel sounds. Extremities:  Without edema. Neurologic:  Alert and oriented,  grossly normal neurologically. Psych:  Cooperative. Normal mood and affect.  Intake/Output from previous day: 04/26 0701 - 04/27 0700 In: 360 [P.O.:360] Out: 300 [Urine:300] Intake/Output this shift: Total I/O In: -  Out: 150 [Urine:150]  Lab Results: Recent Labs    08/02/19 0558 08/03/19 0332 08/04/19 0335  WBC 17.0* 15.6* 17.5*  HGB 10.2* 9.7* 10.2*  HCT 28.6* 27.4* 29.6*  PLT 463* 455* 454*   BMET Recent Labs    08/02/19 0558 08/03/19 0332 08/04/19 0335  NA 131* 129* 127*  K 3.2* 3.6 3.4*  CL 95* 95* 95*  CO2 24 22 22   GLUCOSE 77 94 85  BUN 5* 7 9  CREATININE 0.75 0.81 0.92  CALCIUM 8.1* 8.1* 8.3*   LFT Recent Labs    08/04/19 0335  PROT 5.6*  ALBUMIN 3.1*  AST 88*  ALT 27  ALKPHOS 105  BILITOT 26.4*  BILIDIR 17.2*  IBILI 9.2*   PT/INR Recent Labs    08/02/19 0558 08/03/19 0332  LABPROT 17.8* 18.2*  INR 1.5* 1.5*    Studies/Results: US BIOPSY (LIVER)  Result Date: 08/03/2019 CLINICAL DATA:  Jaundice, worsening bilirubin EXAM: ULTRASOUND-GUIDED CORE LIVER BIOPSY TECHNIQUE: An ultrasound guided liver biopsy was thoroughly discussed with the patient and questions were answered. The benefits,  risks, alternatives, and complications were also discussed. The patient understands and wishes to proceed with the procedure. A verbal as well as written consent was obtained. Survey ultrasound of the liver was performed and an appropriate skin entry site was determined. Skin site was marked, prepped with chlorhexidine, and draped in usual sterile fashion, and infiltrated locally with 1% lidocaine. Intravenous Fentanyl 71mg and Versed 176mwere administered as conscious sedation during continuous monitoring of the patient's level of consciousness and physiological / cardiorespiratory status by the radiology RN, with a total moderate sedation time of 10 minutes. A 17 gauge trocar needle was advanced under ultrasound guidance into the liver. 3 solid-appearing coaxial 18gauge core samples were then obtained through the guide needle. The guide needle was removed. Post procedure scans demonstrate no apparent complication. COMPLICATIONS: COMPLICATIONS None immediate FINDINGS: Only trace abdominal ascites was noted in the left lower quadrant of the abdomen. No focal liver lesion identified on survey interrogation. Core biopsy samples of the right lobe obtained as above. IMPRESSION: 1. Technically successful ultrasound guided core liver biopsy. Electronically Signed   By: D Lucrezia Europe.D.   On: 08/03/2019 10:57    Assessment / Plan:   Assessment: 1.  Acute hepatitis on chronic liver disease: Etiology is still unclear, though T bili continues to rise 23.8--> 26.4, discriminant function now 47 2.  Subclinical hepatic encephalopathy 3.  Coagulopathy  Plan: 1.  Awaiting liver biopsy results 2.  Continue current medications 3.  We will initiate Prednisolone today for suspected alcoholic hepatitis.  Starting Prednisolone 40 mg/day for 28 days 4.  Recheck labs on day 7 to calculate Lilly score for response 5.  Continue to trend daily liver enzymes, INR, CBC and BMP 6.  Please await any further recommendations from Dr.  Bryan Lemma later today  Thank you for kind consultation, we will continue to follow.   LOS: 6 days   Christopher Carrillo  08/04/2019, 11:37 AM

## 2019-08-04 NOTE — Progress Notes (Signed)
PROGRESS NOTE    Christopher Carrillo  YTK:[id number] DOB: [birth year] DOA: 07/29/2019 PCP: Alycia Rossetti, MD   Brief Narrative:  Christopher Carrillo is an 49 y.o. male with medical history significant forHx of alcoholic hepatitis, psoriasis, hypertension, hyperlipidemia, anxiety and depression who presented due to worsening jaundice, fatigue, confusion and new lower extremity edema.   Patient was recently admitted from 4/13-4/18 and was treated for colitis and was found to have drug induced hepatitis and urinary obstruction. He was discharged with bilirubin of 12 with outpatient follow up with GI. He wasthen[facility]erred to [facility] in [place] due to MELD score of 24. He was following [facility] previously for colonic mass and recently underwent biopsy during colonoscopy on 3/31that shows benign mucosaand thought to be due to intermittent ileocecal intussusception. He was admitted under hospital service due to rising bilirubin and confusion.  GI was consulted.  Patient underwent liver biopsy on 08/03/2019.   Assessment & Plan:   Active Problems:   Essential hypertension   Abdominal pain   Elevated bilirubin   Alcoholic hepatitis without ascites   Lower extremity edema   Urinary hesitancy   Anemia of chronic disease   Acute liver failure without hepatic coma   Acute hepatitis   Coagulopathy (HCC)   Severe alcoholic hepatitis/worsening bilirubin/fatigue/confusion GI thinks he has hallmarks of alcoholic hepatitis in the past but has had extensive negative studies. He was referred to transplant center in [place] and awaiting appointment.  Now has been started on prednisolone. GI consult appreciated: Status post liver biopsy.  Slightly up today.  Ammonia only mildly elevated- lactulose started Blood pressure lower end of normal  - limited U/S shows small ascites-  LE edema Likely due to worsening liver disease and hypoalbuminemia  Encourage patient to elevate  legs.  Wonder if he should be started on Lasix and Aldactone combination.  Will defer to GI as they are on board.  Hyponatremia: Improving.  Monitor.  Hypokalemia: Replace.  Recheck in the morning.  Anemia of chronic disease: Stable.  Essential hypertension controlled.  Continue current management.   Chronic alcoholism Reports no alcohol for the past month Continue folic and thiamine  DVT prophylaxis: SCD   Code Status: Full Code  Family Communication:  None present at bedside.  Plan of care discussed with patient in length and he verbalized understanding and agreed with it. Patient is from: Home Disposition Plan: To be determined Barriers to discharge: Significantly elevated bilirubin.  Pending liver biopsy results  Status is: Inpatient  Remains inpatient appropriate because:Inpatient level of care appropriate due to severity of illness   Dispo: The patient is from: Home              Anticipated d/c is to: Home              Anticipated d/c date is: 2 days              Patient currently is not medically stable to d/c.         Estimated body mass index is 28.8 kg/m as calculated from the following:   Height as of this encounter: 5' 9"  (1.753 m).   Weight as of this encounter: 88.5 kg.      Nutritional status:               Consultants:   GI  Procedures:   Liver biopsy 08/03/2019  Antimicrobials:  Anti-infectives (From admission, onward)   None  Subjective: Patient seen and examined.  Just has tiredness and weakness.  No specific complaint.  Has several questions regarding his diagnosis.  We discussed in length but he seemed unconvinced.  Advised him to wait until the biopsy results are back and have further discussion with GI after that.  Objective: Vitals:   08/03/19 1700 08/03/19 2024 08/04/19 0331 08/04/19 0739  BP: 110/70 121/71 114/71 108/70  Pulse: 88 98 90 98  Resp: 16 18 19 16   Temp: 97.9 F (36.6 C) 98.8 F (37.1 C)  98 F (36.7 C) 98 F (36.7 C)  TempSrc: Oral Oral Oral Oral  SpO2: 100% 98% 97% 98%  Weight:      Height:        Intake/Output Summary (Last 24 hours) at 08/04/2019 1126 Last data filed at 08/04/2019 0830 Gross per 24 hour  Intake 360 ml  Output 450 ml  Net -90 ml   Filed Weights   07/29/19 1417  Weight: 88.5 kg    Examination:  General exam: Appears calm and comfortable, appears very icteric Respiratory system: Clear to auscultation. Respiratory effort normal. Cardiovascular system: S1 & S2 heard, RRR. No JVD, murmurs, rubs, gallops or clicks. No pedal edema. Gastrointestinal system: Abdomen is moderately distended, soft and nontender. No organomegaly or masses felt. Normal bowel sounds heard. Central nervous system: Alert and oriented. No focal neurological deficits. Extremities: Symmetric 5 x 5 power. Skin: No rashes, lesions or ulcers Psychiatry: Judgement and insight appear normal. Mood & affect appropriate.    Data Reviewed: I have personally reviewed following labs and imaging studies  CBC: Recent Labs  Lab 07/31/19 0508 08/01/19 0346 08/02/19 0558 08/03/19 0332 08/04/19 0335  WBC 16.9* 17.6* 17.0* 15.6* 17.5*  NEUTROABS  --   --   --  12.2*  --   HGB 10.2* 10.6* 10.2* 9.7* 10.2*  HCT 29.3* 29.8* 28.6* 27.4* 29.6*  MCV 104.6* 103.1* 102.9* 103.0* 103.9*  PLT 474* 477* 463* 455* 616*   Basic Metabolic Panel: Recent Labs  Lab 07/31/19 0508 08/01/19 0346 08/02/19 0558 08/03/19 0332 08/04/19 0335  NA 126* 127* 131* 129* 127*  K 3.4* 3.2* 3.2* 3.6 3.4*  CL 92* 93* 95* 95* 95*  CO2 22 23 24 22 22   GLUCOSE 101* 91 77 94 85  BUN 7 5* 5* 7 9  CREATININE 0.89 0.70 0.75 0.81 0.92  CALCIUM 7.6* 8.1* 8.1* 8.1* 8.3*   GFR: Estimated Creatinine Clearance: 109.2 mL/min (by C-G formula based on SCr of 0.92 mg/dL). Liver Function Tests: Recent Labs  Lab 07/31/19 0508 08/01/19 0346 08/02/19 0558 08/03/19 0332 08/04/19 0335  AST 105* 111* 93* 84* 88*    ALT 35 30 25 26 27   ALKPHOS 141* 134* 115 110 105  BILITOT 18.0* 21.4* 21.9* 23.8* 26.4*  PROT 4.9* 5.3* 5.1* 5.4* 5.6*  ALBUMIN 1.8* 2.5* 2.3* 2.7* 3.1*   Recent Labs  Lab 07/29/19 1409  LIPASE 72*   Recent Labs  Lab 07/30/19 0832  AMMONIA 39*   Coagulation Profile: Recent Labs  Lab 07/29/19 1840 07/31/19 0508 08/01/19 0346 08/02/19 0558 08/03/19 0332  INR 1.3* 1.5* 1.5* 1.5* 1.5*   Cardiac Enzymes: No results for input(s): CKTOTAL, CKMB, CKMBINDEX, TROPONINI in the last 168 hours. BNP (last 3 results) No results for input(s): PROBNP in the last 8760 hours. HbA1C: No results for input(s): HGBA1C in the last 72 hours. CBG: No results for input(s): GLUCAP in the last 168 hours. Lipid Profile: No results for input(s): CHOL, HDL, LDLCALC,  TRIG, CHOLHDL, LDLDIRECT in the last 72 hours. Thyroid Function Tests: No results for input(s): TSH, T4TOTAL, FREET4, T3FREE, THYROIDAB in the last 72 hours. Anemia Panel: No results for input(s): VITAMINB12, FOLATE, FERRITIN, TIBC, IRON, RETICCTPCT in the last 72 hours. Sepsis Labs: No results for input(s): PROCALCITON, LATICACIDVEN in the last 168 hours.  Recent Results (from the past 240 hour(s))  SARS CORONAVIRUS 2 (TAT 6-24 HRS) Nasopharyngeal Nasopharyngeal Swab     Status: None   Collection Time: 07/29/19  7:33 PM   Specimen: Nasopharyngeal Swab  Result Value Ref Range Status   SARS Coronavirus 2 NEGATIVE NEGATIVE Final    Comment: (NOTE) SARS-CoV-2 target nucleic acids are NOT DETECTED. The SARS-CoV-2 RNA is generally detectable in upper and lower respiratory specimens during the acute phase of infection. Negative results do not preclude SARS-CoV-2 infection, do not rule out co-infections with other pathogens, and should not be used as the sole basis for treatment or other patient management decisions. Negative results must be combined with clinical observations, patient history, and epidemiological information. The  expected result is Negative. Fact Sheet for Patients: SugarRoll.be Fact Sheet for Healthcare Providers: https://www.woods-mathews.com/ This test is not yet approved or cleared by the [place] FDA and  has been authorized for detection and/or diagnosis of SARS-CoV-2 by FDA under an Emergency Use Authorization (EUA). This EUA will remain  in effect (meaning this test can be used) for the duration of the COVID-19 declaration under Section 56 4(b)(1) of the Act, 21 U.S.C. section 360bbb-3(b)(1), unless the authorization is terminated or revoked sooner. Performed at [facility], [place] [address] [place], [place] [address]       Radiology Studies: US BIOPSY (LIVER)  Result Date: 08/03/2019 CLINICAL DATA:  Jaundice, worsening bilirubin EXAM: ULTRASOUND-GUIDED CORE LIVER BIOPSY TECHNIQUE: An ultrasound guided liver biopsy was thoroughly discussed with the patient and questions were answered. The benefits, risks, alternatives, and complications were also discussed. The patient understands and wishes to proceed with the procedure. A verbal as well as written consent was obtained. Survey ultrasound of the liver was performed and an appropriate skin entry site was determined. Skin site was marked, prepped with chlorhexidine, and draped in usual sterile fashion, and infiltrated locally with 1% lidocaine. Intravenous Fentanyl 18mg and Versed 161mwere administered as conscious sedation during continuous monitoring of the patient's level of consciousness and physiological / cardiorespiratory status by the radiology RN, with a total moderate sedation time of 10 minutes. A 17 gauge trocar needle was advanced under ultrasound guidance into the liver. 3 solid-appearing coaxial 18gauge core samples were then obtained through the guide needle. The guide needle was removed. Post procedure scans demonstrate no apparent complication. COMPLICATIONS: COMPLICATIONS None  immediate FINDINGS: Only trace abdominal ascites was noted in the left lower quadrant of the abdomen. No focal liver lesion identified on survey interrogation. Core biopsy samples of the right lobe obtained as above. IMPRESSION: 1. Technically successful ultrasound guided core liver biopsy. Electronically Signed   By: D Lucrezia Europe.D.   On: 08/03/2019 10:57    Scheduled Meds: . folic acid  1 mg Oral Daily  . lactulose  10 g Oral BID  . multivitamin with minerals  1 tablet Oral Daily  . tamsulosin  0.4 mg Oral QPC supper  . thiamine  100 mg Oral Daily  . zinc sulfate  220 mg Oral Daily   Continuous Infusions: . albumin human 25 g (08/04/19 0505)     LOS: 6 days   Time  spent: 35 minutes   Darliss Cheney, MD [facility]  08/04/2019, 11:26 AM   To contact the attending provider between 7A-7P or the covering provider during after hours 7P-7A, please log into the web site [email or web address].

## 2019-08-04 NOTE — Plan of Care (Signed)
  Problem: Education: Goal: Knowledge of General Education information will improve Description: Including pain rating scale, medication(s)/side effects and non-pharmacologic comfort measures Outcome: Progressing   Problem: Health Behavior/Discharge Planning: Goal: Ability to manage health-related needs will improve Outcome: Progressing   Problem: Clinical Measurements: Goal: Ability to maintain clinical measurements within normal limits will improve Outcome: Progressing Goal: Will remain free from infection Outcome: Progressing Goal: Diagnostic test results will improve Outcome: Progressing   Problem: Pain Managment: Goal: General experience of comfort will improve Outcome: Progressing   Problem: Safety: Goal: Ability to remain free from injury will improve Outcome: Progressing   Problem: Skin Integrity: Goal: Risk for impaired skin integrity will decrease Outcome: Progressing

## 2019-08-05 DIAGNOSIS — D689 Coagulation defect, unspecified: Secondary | ICD-10-CM | POA: Diagnosis not present

## 2019-08-05 DIAGNOSIS — K7010 Alcoholic hepatitis without ascites: Secondary | ICD-10-CM

## 2019-08-05 DIAGNOSIS — R17 Unspecified jaundice: Secondary | ICD-10-CM | POA: Diagnosis not present

## 2019-08-05 LAB — COMPREHENSIVE METABOLIC PANEL
ALT: 25 U/L (ref 0–44)
AST: 87 U/L — ABNORMAL HIGH (ref 15–41)
Albumin: 3.5 g/dL (ref 3.5–5.0)
Alkaline Phosphatase: 97 U/L (ref 38–126)
Anion gap: 15 (ref 5–15)
BUN: 9 mg/dL (ref 6–20)
CO2: 20 mmol/L — ABNORMAL LOW (ref 22–32)
Calcium: 8.9 mg/dL (ref 8.9–10.3)
Chloride: 98 mmol/L (ref 98–111)
Creatinine, Ser: 0.85 mg/dL (ref 0.61–1.24)
GFR calc Af Amer: >60 mL/min (ref >60)
GFR calc non Af Amer: >60 mL/min (ref >60)
Glucose, Bld: 130 mg/dL — ABNORMAL HIGH (ref 70–99)
Potassium: 3.9 mmol/L (ref 3.5–5.1)
Sodium: 133 mmol/L — ABNORMAL LOW (ref 135–145)
Total Bilirubin: 28.1 mg/dL (ref 0.3–1.2)
Total Protein: 6.4 g/dL — ABNORMAL LOW (ref 6.5–8.1)

## 2019-08-05 LAB — SURGICAL PATHOLOGY

## 2019-08-05 LAB — CBC WITH DIFFERENTIAL/PLATELET
Abs Immature Granulocytes: 0.09 10*3/uL — ABNORMAL HIGH (ref 0.00–0.07)
Basophils Absolute: 0 10*3/uL (ref 0.0–0.1)
Basophils Relative: 0 %
Eosinophils Absolute: 0 10*3/uL (ref 0.0–0.5)
Eosinophils Relative: 0 %
HCT: 31.6 % — ABNORMAL LOW (ref 39.0–52.0)
Hemoglobin: 11 g/dL — ABNORMAL LOW (ref 13.0–17.0)
Immature Granulocytes: 1 %
Lymphocytes Relative: 8 %
Lymphs Abs: 1.2 10*3/uL (ref 0.7–4.0)
MCH: 36.5 pg — ABNORMAL HIGH (ref 26.0–34.0)
MCHC: 34.8 g/dL (ref 30.0–36.0)
MCV: 105 fL — ABNORMAL HIGH (ref 80.0–100.0)
Monocytes Absolute: 1.3 10*3/uL — ABNORMAL HIGH (ref 0.1–1.0)
Monocytes Relative: 9 %
Neutro Abs: 11.5 10*3/uL — ABNORMAL HIGH (ref 1.7–7.7)
Neutrophils Relative %: 82 %
Platelets: 505 10*3/uL — ABNORMAL HIGH (ref 150–400)
RBC: 3.01 MIL/uL — ABNORMAL LOW (ref 4.22–5.81)
RDW: 15.6 % — ABNORMAL HIGH (ref 11.5–15.5)
WBC: 14 10*3/uL — ABNORMAL HIGH (ref 4.0–10.5)
nRBC: 0 % (ref 0.0–0.2)

## 2019-08-05 LAB — PROTIME-INR
INR: 1.7 — ABNORMAL HIGH (ref 0.8–1.2)
Prothrombin Time: 19.4 seconds — ABNORMAL HIGH (ref 11.4–15.2)

## 2019-08-05 NOTE — Plan of Care (Signed)

## 2019-08-05 NOTE — Progress Notes (Signed)
PROGRESS NOTE    Christopher Carrillo  YOV:[id number] DOB: Jul 14, 1970 DOA: 07/29/2019 PCP: Christopher Rossetti, MD   Brief Narrative:  Christopher Carrillo is an 49 y.o. male with medical history significant forHx of alcoholic hepatitis, psoriasis, hypertension, hyperlipidemia, anxiety and depression who presented due to worsening jaundice, fatigue, confusion and new lower extremity edema.   Patient was recently admitted from 4/13-4/18 and was treated for colitis and was found to have drug induced hepatitis and urinary obstruction. He was discharged with bilirubin of 12 with outpatient follow up with GI. He wasthen[facility]erred to [facility] [place] due to MELD score of 24. He was following [facility] previously for colonic mass and recently underwent biopsy during colonoscopy on 3/31that shows benign mucosaand thought to be due to intermittent ileocecal intussusception. He was admitted under hospital service due to rising bilirubin and confusion.  GI was consulted.  Patient underwent liver biopsy on 08/03/2019.   Assessment & Plan:   Active Problems:   Essential hypertension   Abdominal pain   Elevated bilirubin   Alcoholic hepatitis without ascites   Lower extremity edema   Urinary hesitancy   Anemia of chronic disease   Acute liver failure without hepatic coma   Acute hepatitis   Coagulopathy (HCC)  Severe alcoholic hepatitis/worsening bilirubin/fatigue/confusion GI thinks he has hallmarks of alcoholic hepatitis in the past but has had extensive negative studies. He was referred to transplant center in [place] and he has secured an appointment to see them on 08/26/2019.  now has been started on prednisolone. GI consult appreciated: Status post liver biopsy, pathology results are still pending.   Bilirubin slightly elevated again today.  Awaiting GI to come up with final plan about this patient.  Ammonia only mildly elevated- lactulose started Blood pressure lower end  of normal  - limited U/S shows small ascites-  LE edema Likely due to worsening liver disease and hypoalbuminemia  Encouraged patient to elevate legs.  Wonder if he should be started on Lasix and Aldactone combination.  I have talked to GI personally about the recommendations.  NP is waiting for MDs visit to see patient.  Will defer to GI as they are on board.  Hyponatremia: Improving.  Monitor.  Hypokalemia: Resolved  Anemia of chronic disease: Stable.  Essential hypertension controlled.  Continue current management.   Chronic alcoholism Reports no alcohol for the past month Continue folic and thiamine   DVT prophylaxis: SCD   Code Status: Full Code  Family Communication:  None present at bedside.  Plan of care discussed with patient in length and he verbalized understanding and agreed with it. Patient is from: Home Disposition Plan: To be determined Barriers to discharge: Significantly elevated bilirubin.  Pending liver biopsy results and final plans from GI  Status is: Inpatient  Remains inpatient appropriate because:Inpatient level of care appropriate due to severity of illness   Dispo: The patient is from: Home              Anticipated d/c is to: Home              Anticipated d/c date is: 2 days              Patient currently is not medically stable to d/c.         Estimated body mass index is 28.8 kg/m as calculated from the following:   Height as of this encounter: 5' 9"  (1.753 m).   Weight as of this encounter: 88.5 kg.  Nutritional status:               Consultants:   GI  Procedures:   Liver biopsy 08/03/2019  Antimicrobials:  Anti-infectives (From admission, onward)   None         Subjective: Seen and examined.  Continues to complain of intermittent abdominal pain.  No other complaint.  Objective: Vitals:   08/04/19 1228 08/04/19 1717 08/04/19 2030 08/05/19 0535  BP: 114/70 111/71 111/76 118/68  Pulse: (!) 105 92  99 84  Resp: 15 16 18 17   Temp: 98.3 F (36.8 C) 98.2 F (36.8 C) 97.8 F (36.6 C) 97.7 F (36.5 C)  TempSrc: Oral Oral Oral Oral  SpO2: 98% 97% 97% 100%  Weight:      Height:        Intake/Output Summary (Last 24 hours) at 08/05/2019 1221 Last data filed at 08/05/2019 0900 Gross per 24 hour  Intake 1360 ml  Output 1305 ml  Net 55 ml   Filed Weights   07/29/19 1417  Weight: 88.5 kg    Examination:  General exam: Appears calm and comfortable  Respiratory system: Clear to auscultation. Respiratory effort normal. Cardiovascular system: S1 & S2 heard, RRR. No JVD, murmurs, rubs, gallops or clicks. No pedal edema. Gastrointestinal system: Abdomen is moderately distended, soft and nontender. No organomegaly or masses felt. Normal bowel sounds heard.  Positive fluid shift Central nervous system: Alert and oriented. No focal neurological deficits. Extremities: Symmetric 5 x 5 power. Skin: No rashes, lesions or ulcers.  Psychiatry: Judgement and insight appear normal. Mood & affect appropriate.    Data Reviewed: I have personally reviewed following labs and imaging studies  CBC: Recent Labs  Lab 08/01/19 0346 08/02/19 0558 08/03/19 0332 08/04/19 0335 08/05/19 0153  WBC 17.6* 17.0* 15.6* 17.5* 14.0*  NEUTROABS  --   --  12.2*  --  11.5*  HGB 10.6* 10.2* 9.7* 10.2* 11.0*  HCT 29.8* 28.6* 27.4* 29.6* 31.6*  MCV 103.1* 102.9* 103.0* 103.9* 105.0*  PLT 477* 463* 455* 454* 003*   Basic Metabolic Panel: Recent Labs  Lab 08/01/19 0346 08/02/19 0558 08/03/19 0332 08/04/19 0335 08/05/19 0153  NA 127* 131* 129* 127* 133*  K 3.2* 3.2* 3.6 3.4* 3.9  CL 93* 95* 95* 95* 98  CO2 23 24 22 22  20*  GLUCOSE 91 77 94 85 130*  BUN 5* 5* 7 9 9   CREATININE 0.70 0.75 0.81 0.92 0.85  CALCIUM 8.1* 8.1* 8.1* 8.3* 8.9   GFR: Estimated Creatinine Clearance: 118.2 mL/min (by C-G formula based on SCr of 0.85 mg/dL). Liver Function Tests: Recent Labs  Lab 08/01/19 0346 08/02/19 0558  08/03/19 0332 08/04/19 0335 08/05/19 0153  AST 111* 93* 84* 88* 87*  ALT 30 25 26 27 25   ALKPHOS 134* 115 110 105 97  BILITOT 21.4* 21.9* 23.8* 26.4* 28.1*  PROT 5.3* 5.1* 5.4* 5.6* 6.4*  ALBUMIN 2.5* 2.3* 2.7* 3.1* 3.5   Recent Labs  Lab 07/29/19 1409  LIPASE 72*   Recent Labs  Lab 07/30/19 0832  AMMONIA 39*   Coagulation Profile: Recent Labs  Lab 08/01/19 0346 08/02/19 0558 08/03/19 0332 08/04/19 1125 08/05/19 0153  INR 1.5* 1.5* 1.5* 1.7* 1.7*   Cardiac Enzymes: No results for input(s): CKTOTAL, CKMB, CKMBINDEX, TROPONINI in the last 168 hours. BNP (last 3 results) No results for input(s): PROBNP in the last 8760 hours. HbA1C: No results for input(s): HGBA1C in the last 72 hours. CBG: No results for input(s): GLUCAP in the  last 168 hours. Lipid Profile: No results for input(s): CHOL, HDL, LDLCALC, TRIG, CHOLHDL, LDLDIRECT in the last 72 hours. Thyroid Function Tests: No results for input(s): TSH, T4TOTAL, FREET4, T3FREE, THYROIDAB in the last 72 hours. Anemia Panel: No results for input(s): VITAMINB12, FOLATE, FERRITIN, TIBC, IRON, RETICCTPCT in the last 72 hours. Sepsis Labs: No results for input(s): PROCALCITON, LATICACIDVEN in the last 168 hours.  Recent Results (from the past 240 hour(s))  SARS CORONAVIRUS 2 (TAT 6-24 HRS) Nasopharyngeal Nasopharyngeal Swab     Status: None   Collection Time: 07/29/19  7:33 PM   Specimen: Nasopharyngeal Swab  Result Value Ref Range Status   SARS Coronavirus 2 NEGATIVE NEGATIVE Final    Comment: (NOTE) SARS-CoV-2 target nucleic acids are NOT DETECTED. The SARS-CoV-2 RNA is generally detectable in upper and lower respiratory specimens during the acute phase of infection. Negative results do not preclude SARS-CoV-2 infection, do not rule out co-infections with other pathogens, and should not be used as the sole basis for treatment or other patient management decisions. Negative results must be combined with clinical  observations, patient history, and epidemiological information. The expected result is Negative. Fact Sheet for Patients: SugarRoll.be Fact Sheet for Healthcare Providers: https://www.woods-mathews.com/ This test is not yet approved or cleared by the [place] FDA and  has been authorized for detection and/or diagnosis of SARS-CoV-2 by FDA under an Emergency Use Authorization (EUA). This EUA will remain  in effect (meaning this test can be used) for the duration of the COVID-19 declaration under Section 56 4(b)(1) of the Act, 21 U.S.C. section 360bbb-3(b)(1), unless the authorization is terminated or revoked sooner. Performed at [facility], [place] [address] [place], [place] [address]       Radiology Studies: No results found.  Scheduled Meds: . folic acid  1 mg Oral Daily  . lactulose  10 g Oral BID  . multivitamin with minerals  1 tablet Oral Daily  . prednisoLONE  40 mg Oral Daily  . tamsulosin  0.4 mg Oral QPC supper  . thiamine  100 mg Oral Daily  . zinc sulfate  220 mg Oral Daily   Continuous Infusions: . albumin human 25 g (08/05/19 0541)     LOS: 7 days   Time spent: 29 minutes   Darliss Cheney, MD [facility]  08/05/2019, 12:21 PM   To contact the attending provider between 7A-7P or the covering provider during after hours 7P-7A, please log into the web site [email or web address].

## 2019-08-05 NOTE — Plan of Care (Signed)
  Problem: Education: Goal: Knowledge of General Education information will improve Description: Including pain rating scale, medication(s)/side effects and non-pharmacologic comfort measures Outcome: Progressing   Problem: Health Behavior/Discharge Planning: Goal: Ability to manage health-related needs will improve Outcome: Progressing   Problem: Clinical Measurements: Goal: Will remain free from infection Outcome: Progressing Goal: Diagnostic test results will improve Outcome: Progressing   Problem: Pain Managment: Goal: General experience of comfort will improve Outcome: Progressing   Problem: Safety: Goal: Ability to remain free from injury will improve Outcome: Progressing   Problem: Skin Integrity: Goal: Risk for impaired skin integrity will decrease Outcome: Progressing

## 2019-08-05 NOTE — Progress Notes (Signed)
Daily Rounding Note  08/05/2019, 9:31 AM  LOS: 7 days   SUBJECTIVE:   Chief complaint: Acute hepatitis. Still having pain in right abdomen and some pins-and-needles pain in his legs when he bends them or walks.  Legs are still swollen. 3 bowel movements yesterday, none so far today.  Appetite fair without nausea.  OBJECTIVE:         Vital signs in last 24 hours:    Temp:  [97.7 F (36.5 C)-98.3 F (36.8 C)] 97.7 F (36.5 C) (04/28 0535) Pulse Rate:  [84-105] 84 (04/28 0535) Resp:  [15-18] 17 (04/28 0535) BP: (111-118)/(68-76) 118/68 (04/28 0535) SpO2:  [97 %-100 %] 100 % (04/28 0535) Last BM Date: 08/05/19 Filed Weights   07/29/19 1417  Weight: 88.5 kg   General: A bit drowsy but comfortable and alert. Heart: RRR Chest: No labored breathing, clear bilaterally Abdomen: Distended, soft, minimal right-sided tenderness. Extremities: Pitting edema in both legs a bit worse on the right. Neuro/Psych: Oriented x3.  No tremors or asterixis.  Speech is a little bit slow but clear and appropriate.  Not confused  Intake/Output from previous day: 04/27 0701 - 04/28 0700 In: 1240 [P.O.:1040; IV Piggyback:200] Out: 2446 [Urine:1455]  Intake/Output this shift: No intake/output data recorded.  Lab Results: Recent Labs    08/03/19 0332 08/04/19 0335 08/05/19 0153  WBC 15.6* 17.5* 14.0*  HGB 9.7* 10.2* 11.0*  HCT 27.4* 29.6* 31.6*  PLT 455* 454* 505*   BMET Recent Labs    08/03/19 0332 08/04/19 0335 08/05/19 0153  NA 129* 127* 133*  K 3.6 3.4* 3.9  CL 95* 95* 98  CO2 22 22 20*  GLUCOSE 94 85 130*  BUN 7 9 9   CREATININE 0.81 0.92 0.85  CALCIUM 8.1* 8.3* 8.9   LFT Recent Labs    08/03/19 0332 08/04/19 0335 08/05/19 0153  PROT 5.4* 5.6* 6.4*  ALBUMIN 2.7* 3.1* 3.5  AST 84* 88* 87*  ALT 26 27 25   ALKPHOS 110 105 97  BILITOT 23.8* 26.4* 28.1*  BILIDIR  --  17.2*  --   IBILI  --  9.2*  --     PT/INR Recent Labs    08/04/19 1125 08/05/19 0153  LABPROT 18.9* 19.4*  INR 1.7* 1.7*   Hepatitis Panel No results for input(s): HEPBSAG, HCVAB, HEPAIGM, HEPBIGM in the last 72 hours.  Studies/Results: US BIOPSY (LIVER)  Result Date: 08/03/2019 CLINICAL DATA:  Jaundice, worsening bilirubin EXAM: ULTRASOUND-GUIDED CORE LIVER BIOPSY TECHNIQUE: An ultrasound guided liver biopsy was thoroughly discussed with the patient and questions were answered. The benefits, risks, alternatives, and complications were also discussed. The patient understands and wishes to proceed with the procedure. A verbal as well as written consent was obtained. Survey ultrasound of the liver was performed and an appropriate skin entry site was determined. Skin site was marked, prepped with chlorhexidine, and draped in usual sterile fashion, and infiltrated locally with 1% lidocaine. Intravenous Fentanyl 19mg and Versed 139mwere administered as conscious sedation during continuous monitoring of the patient's level of consciousness and physiological / cardiorespiratory status by the radiology RN, with a total moderate sedation time of 10 minutes. A 17 gauge trocar needle was advanced under ultrasound guidance into the liver. 3 solid-appearing coaxial 18gauge core samples were then obtained through the guide needle. The guide needle was removed. Post procedure scans demonstrate no apparent complication. COMPLICATIONS: COMPLICATIONS None immediate FINDINGS: Only trace abdominal ascites was noted in the left  lower quadrant of the abdomen. No focal liver lesion identified on survey interrogation. Core biopsy samples of the right lobe obtained as above. IMPRESSION: 1. Technically successful ultrasound guided core liver biopsy. Electronically Signed   By: Lucrezia Europe M.D.   On: 08/03/2019 10:57   Scheduled Meds: . folic acid  1 mg Oral Daily  . lactulose  10 g Oral BID  . multivitamin with minerals  1 tablet Oral Daily  .  prednisoLONE  40 mg Oral Daily  . tamsulosin  0.4 mg Oral QPC supper  . thiamine  100 mg Oral Daily  . zinc sulfate  220 mg Oral Daily   Continuous Infusions: . albumin human 25 g (08/05/19 0541)   PRN Meds:.HYDROmorphone (DILAUDID) injection, oxyCODONE   ASSESMENT:   *    Acute hepatitis in background of chronic liver disease, rising discriminant function score.   History of alcohol abuse, quit EtOH altogether ~1 month ago. 08/03/2019 liver biopsy pathology is pending.   On albumin, zinc. Prednisolone day 2 Day 6 tid IV albumin infusions  *    Hepatic encephalopathy.  On lactulose.  *    Coagulopathy.  INR stable 1.7.  *     Hyponatremia, improved.  *    Macrocytosis, minor anemia.   PLAN   *    [place] score on 5/6.  Continue prednisolone for now. How long to continue Albumin?     Azucena Freed  08/05/2019, 9:31 AM Phone [phone]

## 2019-08-06 ENCOUNTER — Other Ambulatory Visit: Payer: Self-pay

## 2019-08-06 DIAGNOSIS — R600 Localized edema: Secondary | ICD-10-CM

## 2019-08-06 LAB — CBC WITH DIFFERENTIAL/PLATELET
Abs Immature Granulocytes: 0.13 10*3/uL — ABNORMAL HIGH (ref 0.00–0.07)
Basophils Absolute: 0 10*3/uL (ref 0.0–0.1)
Basophils Relative: 0 %
Eosinophils Absolute: 0 10*3/uL (ref 0.0–0.5)
Eosinophils Relative: 0 %
HCT: 29.6 % — ABNORMAL LOW (ref 39.0–52.0)
Hemoglobin: 10.4 g/dL — ABNORMAL LOW (ref 13.0–17.0)
Immature Granulocytes: 1 %
Lymphocytes Relative: 6 %
Lymphs Abs: 1 10*3/uL (ref 0.7–4.0)
MCH: 37 pg — ABNORMAL HIGH (ref 26.0–34.0)
MCHC: 35.1 g/dL (ref 30.0–36.0)
MCV: 105.3 fL — ABNORMAL HIGH (ref 80.0–100.0)
Monocytes Absolute: 1.7 10*3/uL — ABNORMAL HIGH (ref 0.1–1.0)
Monocytes Relative: 10 %
Neutro Abs: 13.7 10*3/uL — ABNORMAL HIGH (ref 1.7–7.7)
Neutrophils Relative %: 83 %
Platelets: 547 10*3/uL — ABNORMAL HIGH (ref 150–400)
RBC: 2.81 MIL/uL — ABNORMAL LOW (ref 4.22–5.81)
RDW: 15.8 % — ABNORMAL HIGH (ref 11.5–15.5)
WBC: 16.6 10*3/uL — ABNORMAL HIGH (ref 4.0–10.5)
nRBC: 0 % (ref 0.0–0.2)

## 2019-08-06 LAB — COMPREHENSIVE METABOLIC PANEL
ALT: 28 U/L (ref 0–44)
AST: 72 U/L — ABNORMAL HIGH (ref 15–41)
Albumin: 3.5 g/dL (ref 3.5–5.0)
Alkaline Phosphatase: 90 U/L (ref 38–126)
Anion gap: 11 (ref 5–15)
BUN: 9 mg/dL (ref 6–20)
CO2: 24 mmol/L (ref 22–32)
Calcium: 9.1 mg/dL (ref 8.9–10.3)
Chloride: 102 mmol/L (ref 98–111)
Creatinine, Ser: 0.64 mg/dL (ref 0.61–1.24)
GFR calc Af Amer: >60 mL/min (ref >60)
GFR calc non Af Amer: >60 mL/min (ref >60)
Glucose, Bld: 126 mg/dL — ABNORMAL HIGH (ref 70–99)
Potassium: 3.9 mmol/L (ref 3.5–5.1)
Sodium: 137 mmol/L (ref 135–145)
Total Bilirubin: 22.5 mg/dL (ref 0.3–1.2)
Total Protein: 6.2 g/dL — ABNORMAL LOW (ref 6.5–8.1)

## 2019-08-06 LAB — PROTIME-INR
INR: 1.8 — ABNORMAL HIGH (ref 0.8–1.2)
Prothrombin Time: 19.9 seconds — ABNORMAL HIGH (ref 11.4–15.2)

## 2019-08-06 MED ORDER — LACTULOSE 10 GM/15ML PO SOLN: 10.0000 g | Freq: Two times a day (BID) | ORAL | 0 refills | Status: DC

## 2019-08-06 MED ORDER — PREDNISOLONE 5 MG PO TABS: 40.0000 mg | ORAL_TABLET | Freq: Every day | ORAL | 0 refills | Status: AC

## 2019-08-06 MED ORDER — SPIRONOLACTONE 50 MG PO TABS: 50.0000 mg | ORAL_TABLET | Freq: Every day | ORAL | 0 refills | Status: DC

## 2019-08-06 MED ORDER — SPIRONOLACTONE 25 MG PO TABS
50.0000 mg | ORAL_TABLET | Freq: Every day | ORAL | Status: DC
  Administered 2019-08-06: 10:00:00 50 mg via ORAL
  Filled 2019-08-06: qty 2

## 2019-08-06 MED ORDER — FUROSEMIDE 20 MG PO TABS
20.0000 mg | ORAL_TABLET | Freq: Every day | ORAL | Status: DC
  Administered 2019-08-06: 20 mg via ORAL
  Filled 2019-08-06: qty 1

## 2019-08-06 MED ORDER — FUROSEMIDE 20 MG PO TABS: 20.0000 mg | ORAL_TABLET | Freq: Every day | ORAL | 0 refills | Status: DC

## 2019-08-06 NOTE — Progress Notes (Signed)
Discharge instructions given; Pt in stable condition; Pt to be pick up by wife at the [facility] [employer] entrance.

## 2019-08-06 NOTE — Discharge Instructions (Signed)
Hepatic Encephalopathy  Hepatic encephalopathy is a loss of brain function due to advanced liver disease. When the liver is damaged, harmful substances (toxins) can build up in the body. Some of these toxins, such as ammonia, can harm the brain. The effects of the condition depend on the type of liver damage and how severe it is. In some cases, hepatic encephalopathy can be reversed. What are the causes? Certain things can trigger or worsen hepatic encephalopathy, such as:  Infection.  Constipation.  Taking certain medicines, such as benzodiazepines.  Alcohol use.  Bleeding into the intestinal tract.  Imbalances in minerals (electrolytes) in the body.  Dehydration. Hepatic encephalopathy can sometimes be reversed if these triggers are resolved. What increases the risk? You are at risk of developing this condition if you have advanced liver disease (cirrhosis). Conditions that can cause liver disease include:  Infections in the liver, such as hepatitis C.  Infections in the blood.  Drinking a lot of alcohol over a long period of time.  Taking certain medicines, including tranquilizers, diuretics, antidepressants, sleeping pills, or acetaminophen.  Genetic diseases, such as Wilson's disease. What are the signs or symptoms? Symptoms may develop suddenly. Or, they may develop slowly and get worse gradually. Symptoms can range from mild to severe. Mild symptoms include:  Mild confusion.  Shortened attention span.  Personality and mood changes.  Anxiety and agitation.  Drowsiness. Symptoms of worsening or severe hepatic encephalopathy include:  Extreme confusion (disorientation).  Slowed movement.  Slurred speech.  Extreme personality changes.  Abnormal shaking or flapping of the hands.  Coma. How is this diagnosed? This condition may be diagnosed based on:  A physical exam.  Your symptoms and medical history.  Blood tests. These may be done to check levels  of ammonia in your blood, measure how long it takes your blood to clot, or check for infection.  Liver function tests. These may be done to check how well your liver is working.  MRI and CT scans. These may be done to check for a brain disorder and to check for problems with your liver.  Electroencephalogram (EEG). This test measures the electrical activity in your brain. How is this treated? The first step in treatment is to identify and treat the cause of your liver damage or triggering illness, if possible. The next step is taking medicine to lower the level of toxins in your body and prevent ammonia from building up. Treatment will depend on how severe your encephalopathy is, and may include:  Medicine to lower your ammonia level (lactulose).  Antibiotic medicine to reduce the amount of ammonia-producing bacteria in your gut.  Close monitoring of your blood pressure, heart rate, breathing, and oxygen levels.  Removal of fluid from your abdomen.  Close monitoring of how you think, feel, and act (mental status).  Dietary changes.  Liver transplant, in severe cases. Follow these instructions at home: Eating and drinking   Work with a dietitian or with your health care provider to make sure you are getting the right balance of protein and minerals.  Drink enough fluids to keep your urine pale yellow.  Do not drink alcohol or use drugs. General instructions  If you were prescribed an antibiotic, take it as told by your health care provider. Do not stop taking the antibiotic even if your condition improves.  Take other over-the-counter and prescription medicines only as told by your health care provider.  Do not start taking any new medicines, including over-the-counter medicines, without first   checking with your health care provider.  Keep all follow-up visits as told by your health care provider. This is important. Contact a health care provider if:  You develop new  symptoms.  Your symptoms change or get worse.  You have a fever.  You are constipated. Signs of constipation include having: ? Fewer bowel movements in a week than normal. ? Trouble having a bowel movement. ? Stools that are dry, hard, or larger than normal.  You have persistent nausea, vomiting, or diarrhea. Get help right away if:  You become very confused or drowsy.  You vomit blood or material that looks like coffee grounds.  Your stool is bloody, black, or looks like tar. Summary  Hepatic encephalopathy is a loss of brain function due to advanced liver disease. When the liver is damaged, harmful substances (toxins) can build up in your body. Some of these toxins, such as ammonia, can harm your brain.  Certain things can trigger or worsen hepatic encephalopathy. Hepatic encephalopathy can sometimes be reversed if these triggers are resolved.  The first step in treatment is to identify and treat the cause of your liver damage or triggering illness, if possible. The next step is taking medicine to lower the level of toxins in your body and prevent ammonia from building up.  Your treatment will depend on how severe your hepatic encephalopathy is. This information is not intended to replace advice given to you by your health care provider. Make sure you discuss any questions you have with your health care provider. Document Revised: 03/08/2017 Document Reviewed: 12/25/2016 Elsevier Patient Education  2020 [employer]vier Inc.  

## 2019-08-06 NOTE — Progress Notes (Signed)
Daily Rounding Note  08/06/2019, 10:04 AM  LOS: 8 days   SUBJECTIVE:   Chief complaint: Alcoholic hepatitis     Pt anxious about discharging home due to ongoing abd pain, not as severe.  Leg swelling persists.  Afraid that he will not improve and fearful since he had to be readmitted within a few days of the 4/13 - 4/18 admission in [place].    Good appetite  OBJECTIVE:         Vital signs in last 24 hours:    Temp:  [97.8 F (36.6 C)-98.9 F (37.2 C)] 98.9 F (37.2 C) (04/29 0358) Pulse Rate:  [89-97] 89 (04/29 0358) Resp:  [18-20] 18 (04/29 0358) BP: (113-122)/(73-78) 116/73 (04/29 0358) SpO2:  [95 %-96 %] 96 % (04/29 0358) Last BM Date: 08/05/19 Filed Weights   07/29/19 1417  Weight: 88.5 kg   General: jaundiced, comfortable   Heart: rrr Chest: clear bil Abdomen: soft, ND, minor RUQ tenderness  Extremities: LE edema bil.   Neuro/Psych:  Oriented x 3.  No asterixis.  Anxious.    Intake/Output from previous day: 04/28 0701 - 04/29 0700 In: 1545.3 [P.O.:1200; IV Piggyback:345.3] Out: 3400 [Urine:3400]  Intake/Output this shift: No intake/output data recorded.  Lab Results: Recent Labs    08/04/19 0335 08/05/19 0153 08/06/19 0124  WBC 17.5* 14.0* 16.6*  HGB 10.2* 11.0* 10.4*  HCT 29.6* 31.6* 29.6*  PLT 454* 505* 547*   BMET Recent Labs    08/04/19 0335 08/05/19 0153 08/06/19 0124  NA 127* 133* 137  K 3.4* 3.9 3.9  CL 95* 98 102  CO2 22 20* 24  GLUCOSE 85 130* 126*  BUN 9 9 9   CREATININE 0.92 0.85 0.64  CALCIUM 8.3* 8.9 9.1   LFT Recent Labs    08/04/19 0335 08/05/19 0153 08/06/19 0124  PROT 5.6* 6.4* 6.2*  ALBUMIN 3.1* 3.5 3.5  AST 88* 87* 72*  ALT 27 25 28   ALKPHOS 105 97 90  BILITOT 26.4* 28.1* 22.5*  BILIDIR 17.2*  --   --   IBILI 9.2*  --   --    PT/INR Recent Labs    08/05/19 0153 08/06/19 0124  LABPROT 19.4* 19.9*  INR 1.7* 1.8*   Hepatitis Panel No results  for input(s): HEPBSAG, HCVAB, HEPAIGM, HEPBIGM in the last 72 hours.  Studies/Results: No results found.   Scheduled Meds: . folic acid  1 mg Oral Daily  . furosemide  20 mg Oral Daily  . lactulose  10 g Oral BID  . multivitamin with minerals  1 tablet Oral Daily  . prednisoLONE  40 mg Oral Daily  . spironolactone  50 mg Oral Daily  . tamsulosin  0.4 mg Oral QPC supper  . thiamine  100 mg Oral Daily  . zinc sulfate  220 mg Oral Daily   Continuous Infusions: PRN Meds:.HYDROmorphone (DILAUDID) injection, oxyCODONE   ASSESMENT:   *    Acute alcoholic hepatitis, ? Element of chronic liver disease, rising discriminant function score.   History of alcohol abuse, quit EtOH altogether early 06/2019. 08/03/2019 liver biopsy.  Path shows moderately active steatohepatitis, grade 2 of 3.  Advanced fibrosis, at least stage 3 of 4, some fibrosis appears mature but many areas show reticular fibrosis which may revert.    Diffuse hepatocellular and canalicular cholestasis.  Fibrous septa and portal tracts with mild lymphocytic inflammation.   No abnormal iron accumulation.  Presence of well-formed Mallory bodies suggest  alcoholic steatohepatitis but histopathology cannot differentiate from [place] versus NASH conclusively.  No definitive cirrhosis.   On albumin, zinc. Prednisolone day 3 Completed 6 d IV Albumin.   T bili , AST, alkk phos continue to improve.    *    Hepatic encephalopathy.  On lactulose.  *    Coagulopathy.  INR 1.7 -1.8.  *     Hyponatremia, resolved  *    Macrocytosis, minor anemia.  Abnormal CT and FOBT positive stool worked up with colonoscopy 07/20/2019.  This showed mass at IC valve and polyp in descending colon.  Biopsy of mass benign, polyp was TA without HGD.  *   Anasarca, small ascites per [place].  day 1 lasix 20/aldactone 50.  *   Anxiety.     PLAN   *   Fup as scheduled in March with [facility] and w [place] NP, Dr Oneida Alar or Dr Gala Romney at [facility] in  [place].  *   ETOH abstinence.  Encourage AA attendance.    *   OK to Discharge home  Needs cmet within 10 d of discharge.  This can be done at [facility].   Prednisolone 40/daily for 28 days, then taper by 10 mg weekly until finished.  Lactulose 10 gm bid, titrate dose for 3 BM's daily.   Continue aldactone, lasix.     Azucena Freed  08/06/2019, 10:04 AM Phone [phone]

## 2019-08-06 NOTE — Discharge Summary (Addendum)
Physician Discharge Summary  [name] AQT:[id number] DOB: 02-12-1971 DOA: 07/29/2019  PCP: Alycia Rossetti, MD  Admit date: 07/29/2019 Discharge date: 08/06/2019  Admitted From: Home Disposition: Home  Recommendations for Outpatient Follow-up:  1. Follow up with PCP in 1-2 weeks 2. [facility] [place] [facility]/2019 as a scheduled 3. Please obtain BMP/CBC in one week 4. Please follow up on the following pending results:  Home Health: None Equipment/Devices: None  Discharge Condition: Stable CODE STATUS: Full code Diet recommendation: Low-sodium/low-fat  Subjective: Seen and examined.  Feels better.  No complaints.  Ready to go home.  Brief/Interim Summary: Christopher Carrillo an 49 y.o.malewith medical history significant forHx of alcoholic hepatitis, psoriasis, hypertension, hyperlipidemia, anxiety and depression who presented due to worsening jaundice, fatigue, confusion and new lower extremity edema.   Patient was recently admitted from 4/13-4/18 and was treated for colitis and was found to have drug induced hepatitis and urinary obstruction. He was discharged with bilirubin of 12 with outpatient follow up with GI. [facility] [place] due to MELD score of 24. He was following [facility] previously for colonic mass and recently underwent biopsy during colonoscopy on 3/31that shows benign mucosaand thought to be due to intermittent ileocecal intussusception. He was admitted under hospital service due to rising bilirubin and confusion.  GI was consulted.  His ammonia was mildly elevated however he did not have acute hepatic encephalopathy.  He was started on lactulose.   Patient was started on prednisolone 40 mg p.o. daily for possible alcohol induced hepatitis.  Patient underwent liver biopsy on 08/03/2019 the results of which indicates alcoholic hepatitis/liver fibrosis, advanced.  Due to edema, he  was also started on combination of Lasix and Aldactone today.  Patient's bilirubin trended up initially with the PK 28 which is improved to 22 today.  I have personally discussed with GI on board Dr. Clarisa Fling who has cleared the patient for discharge.  He will be discharged on 25 more days of prednisolone and Lasix and Aldactone combination along with lactulose.  We recommend his PCP repeat his BMP as diuretics are new for him.  He will [facility] [place] on 08/26/2019.  They will further taper his prednisolone from there.  Patient and his wife who I spoke to over the phone are in agreement with the discharge plan.  They verbalized understanding all instructions.  Discharge Diagnoses:  Active Problems:   Essential hypertension   Abdominal pain   Elevated bilirubin   Alcoholic hepatitis without ascites   Lower extremity edema   Urinary hesitancy   Anemia of chronic disease   Acute liver failure without hepatic coma   Acute hepatitis   Coagulopathy ([place])    Discharge Instructions   Allergies as of 08/06/2019   No Known Allergies     Medication List    STOP taking these medications   oxyCODONE 5 MG immediate release tablet Commonly known as: Oxy IR/ROXICODONE     TAKE these medications   CLEAR EYES COMPLETE OP Place 1 drop into both eyes daily as needed (for dry eyes).   folic acid 1 MG tablet Commonly known as: FOLVITE Take 1 tablet (1 mg total) by mouth daily.   furosemide 20 MG tablet Commonly known as: LASIX Take 1 tablet (20 mg total) by mouth daily. Start taking on: August 07, 2019   lactulose 10 GM/15ML solution Commonly known as: CHRONULAC Take 15 mLs (10  g total) by mouth 2 (two) times daily. Reduce or stop taking if you have more than 3 bowel movements a day   multivitamin with minerals Tabs tablet Take 1 tablet by mouth daily.   polyethylene glycol 17 g packet Commonly known as: MIRALAX / GLYCOLAX Take 17 g by mouth daily as  needed for mild constipation.   prednisoLONE 5 MG Tabs tablet Take 8 tablets (40 mg total) by mouth daily for 25 days. Start taking on: August 07, 2019   spironolactone 50 MG tablet Commonly known as: ALDACTONE Take 1 tablet (50 mg total) by mouth daily. Start taking on: August 07, 2019   tamsulosin 0.4 MG Caps capsule Commonly known as: FLOMAX Take 1 capsule (0.4 mg total) by mouth daily after supper.   thiamine 100 MG tablet Take 1 tablet (100 mg total) by mouth daily.      Follow-up Information    [place], Modena Nunnery, MD Follow up in 1 week(s).   Specialty: Family Medicine Contact information: [place] [address] [phone]          No Known Allergies  Consultations: GI   Procedures/Studies: US Abdomen Complete  Result Date: 07/21/2019 CLINICAL DATA:  Abdominal distension in jaundice EXAM: ABDOMEN ULTRASOUND COMPLETE COMPARISON:  07/08/2019 FINDINGS: Gallbladder: No gallstones or wall thickening visualized. No sonographic Murphy sign noted by sonographer. Common bile duct: Diameter: 2.2 mm. Liver: Hepatomegaly is noted. Increased echogenicity with heterogeneity is seen consistent with fatty infiltration noted on prior CT examination. Portal vein is patent on color Doppler imaging with normal direction of blood flow towards the liver. IVC: No abnormality visualized. Pancreas: Visualized portion unremarkable. Spleen: Size and appearance within normal limits. Right Kidney: Length: 12.4 cm. Echogenicity within normal limits. No mass or hydronephrosis visualized. Left Kidney: Length: 11.1 cm. 1 cm small cyst is noted in the lower pole of the left kidney. No mass lesion or hydronephrosis is noted. Abdominal aorta: No aneurysm visualized. Other findings: None. IMPRESSION: Fatty liver with hepatomegaly.  No mass lesion is seen. Small left renal cyst Electronically Signed   By: Inez Catalina M.D.   On: 07/21/2019 17:58   CT ABDOMEN PELVIS W CONTRAST  Result  Date: 07/21/2019 CLINICAL DATA:  Elevated LFT and jaundice. Rule out common duct stone EXAM: CT ABDOMEN AND PELVIS WITH CONTRAST TECHNIQUE: Multidetector CT imaging of the abdomen and pelvis was performed using the standard protocol following bolus administration of intravenous contrast. CONTRAST:  112m OMNIPAQUE IOHEXOL 300 MG/ML  SOLN COMPARISON:  Ultrasound abdomen 07/21/2019. CT abdomen pelvis 07/08/2019 FINDINGS: Lower chest: Lung bases clear bilaterally. Hepatobiliary: Marked fatty infiltration liver which is diffusely low density and enlarged. There are scattered patchy areas of fatty sparing in the liver. Gallbladder and bile ducts normal. No biliary dilatation. Pancreas: Negative for mass or edema. Spleen: Negative Adrenals/Urinary Tract: Adrenal glands are unremarkable. Kidneys are normal, without renal calculi, focal lesion, or hydronephrosis. Bladder is unremarkable. Stomach/Bowel: Negative for bowel obstruction. Diverticulosis in the cecum. There is mucosal edema throughout most of the right colon. This appears slightly improved from the prior study and may be due to colitis. Although there are diverticula in the cecum, the edema extends throughout the right colon therefore infectious colitis seems more likely than diverticulitis. Bowel ischemia also a consideration. Normal appendix. Vascular/Lymphatic: Mild atherosclerotic disease. No aneurysm. Negative for lymphadenopathy. Reproductive: Prostate not enlarged. Other: Small amount of free fluid in the pelvis. Musculoskeletal: No acute skeletal abnormality. IMPRESSION: 1. Severe fatty infiltration  liver.  No biliary dilatation. 2. Edema in the right colon. Possible infectious or ischemic colitis. Review of the prior CT reveals edema in the cecum with possible intussusception of the ileum into the colon. This appears to have resolved in the interval. Electronically Signed   By: Franchot Gallo M.D.   On: 07/21/2019 20:24   CT ABDOMEN PELVIS W  CONTRAST  Result Date: 07/08/2019 CLINICAL DATA:  Right-sided abdominal pain, nausea and vomiting, elevated liver enzymes EXAM: CT ABDOMEN AND PELVIS WITH CONTRAST TECHNIQUE: Multidetector CT imaging of the abdomen and pelvis was performed using the standard protocol following bolus administration of intravenous contrast. CONTRAST:  164m OMNIPAQUE IOHEXOL 300 MG/ML  SOLN COMPARISON:  02/02/2019 FINDINGS: Lower chest: No acute pleural or parenchymal lung disease. Hepatobiliary: Diffuse hepatic steatosis without focal abnormality. The gallbladder is unremarkable. Pancreas: Unremarkable. No pancreatic ductal dilatation or surrounding inflammatory changes. Spleen: Normal in size without focal abnormality. Adrenals/Urinary Tract: Adrenal glands are unremarkable. Kidneys are normal, without renal calculi, focal lesion, or hydronephrosis. Bladder is unremarkable. Stomach/Bowel: A normal retrocecal appendix is identified. There is masslike thickening within the cecum at the ileocecal valve, measuring 4.4 x 3.1 x 4.8 cm. Neoplasm cannot be excluded, and colonoscopy is recommended for further evaluation. There is no bowel obstruction or ileus. Minimal distal colonic diverticulosis without diverticulitis. Vascular/Lymphatic: Aortic atherosclerosis. No enlarged abdominal or pelvic lymph nodes. Reproductive: Prostate is unremarkable. Other: There several subcentimeter lymph nodes in the right lower quadrant mesentery, nonspecific. I do not see any pathologic adenopathy within the abdomen or pelvis. No free intra-abdominal gas. Musculoskeletal: No acute or destructive bony lesions. Reconstructed images demonstrate no additional findings. IMPRESSION: 1. 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 Colonoscopy is recommended for further evaluation. 2. Diffuse hepatic steatosis without focal abnormality. Electronically Signed   By: MRanda NgoM.D.   On: 07/08/2019 20:18   [place]oreaAbdomen  Limited  Result Date: 07/31/2019 CLINICAL DATA:  49year old male with abdominal distension. Alcoholic hepatitis and jaundice. Evaluate for ascites. EXAM: LIMITED ABDOMEN ULTRASOUND FOR ASCITES TECHNIQUE: Limited ultrasound survey for ascites was performed in all four abdominal quadrants. COMPARISON:  Right upper quadrant ultrasound dated 07/21/2019. FINDINGS: There is a very small ascites in the lower abdomen. There is diffuse increased liver echogenicity most commonly seen in the setting of fatty infiltration. Superimposed inflammation or fibrosis is not excluded. Clinical correlation is recommended. IMPRESSION: Small ascites. Electronically Signed   By: AAnner CreteM.D.   On: 07/31/2019 17:48   [place]BIOPSY (LIVER)  Result Date: 08/03/2019 CLINICAL DATA:  Jaundice, worsening bilirubin EXAM: ULTRASOUND-GUIDED CORE LIVER BIOPSY TECHNIQUE: An ultrasound guided liver biopsy was thoroughly discussed with the patient and questions were answered. The benefits, risks, alternatives, and complications were also discussed. The patient understands and wishes to proceed with the procedure. A verbal as well as written consent was obtained. Survey ultrasound of the liver was performed and an appropriate skin entry site was determined. Skin site was marked, prepped with chlorhexidine, and draped in usual sterile fashion, and infiltrated locally with 1% lidocaine. Intravenous Fentanyl 530m and Versed 45m29mere administered as conscious sedation during continuous monitoring of the patient's level of consciousness and physiological / cardiorespiratory status by the radiology RN, with a total moderate sedation time of 10 minutes. A 17 gauge trocar needle was advanced under ultrasound guidance into the liver. 3 solid-appearing coaxial 18gauge core samples were then obtained through the guide needle. The guide needle was removed. Post procedure scans  demonstrate no apparent complication. COMPLICATIONS: COMPLICATIONS None  immediate FINDINGS: Only trace abdominal ascites was noted in the left lower quadrant of the abdomen. No focal liver lesion identified on survey interrogation. Core biopsy samples of the right lobe obtained as above. IMPRESSION: 1. Technically successful ultrasound guided core liver biopsy. Electronically Signed   By: Lucrezia Europe M.D.   On: 08/03/2019 10:57      Discharge Exam: Vitals:   08/05/19 2021 08/06/19 0358  BP: 122/76 116/73  Pulse: 91 89  Resp: 20 18  Temp: 97.9 F (36.6 C) 98.9 F (37.2 C)  SpO2: 95% 96%   Vitals:   08/05/19 0535 08/05/19 1311 08/05/19 2021 08/06/19 0358  BP: 118/68 113/78 122/76 116/73  Pulse: 84 97 91 89  Resp: 17 18 20 18   Temp: 97.7 F (36.5 C) 97.8 F (36.6 C) 97.9 F (36.6 C) 98.9 F (37.2 C)  TempSrc: Oral Oral Oral Oral  SpO2: 100% 96% 95% 96%  Weight:      Height:        General: Pt is alert, awake, not in acute distress, icteric Cardiovascular: RRR, S1/S2 +, no rubs, no gallops Respiratory: CTA bilaterally, no wheezing, no rhonchi Abdominal: Soft, NT, moderately distended, bowel sounds + Extremities: no edema, no cyanosis    The results of significant diagnostics from this hospitalization (including imaging, microbiology, ancillary and laboratory) are listed below for reference.     Microbiology: Recent Results (from the past 240 hour(s))  SARS CORONAVIRUS 2 (TAT 6-24 HRS) Nasopharyngeal Nasopharyngeal Swab     Status: None   Collection Time: 07/29/19  7:33 PM   Specimen: Nasopharyngeal Swab  Result Value Ref Range Status   SARS Coronavirus 2 NEGATIVE NEGATIVE Final    Comment: (NOTE) SARS-CoV-2 target nucleic acids are NOT DETECTED. The SARS-CoV-2 RNA is generally detectable in upper and lower respiratory specimens during the acute phase of infection. Negative results do not preclude SARS-CoV-2 infection, do not rule out co-infections with other pathogens, and should not be used as the sole basis for treatment or other  patient management decisions. Negative results must be combined with clinical observations, patient history, and epidemiological information. The expected result is Negative. Fact Sheet for Patients: SugarRoll.be Fact Sheet for Healthcare Providers: https://www.woods-mathews.com/ This test is not yet approved or cleared by the [place] FDA and  has been authorized for detection and/or diagnosis of SARS-CoV-2 by FDA under an Emergency Use Authorization (EUA). This EUA will remain  in effect (meaning this test can be used) for the duration of the COVID-19 declaration under Section 56 4(b)(1) of the Act, 21 U.S.C. section 360bbb-3(b)(1), unless the authorization is terminated or revoked sooner. Performed at [facility], [place] [address], [place], [place] [address]      Labs: BNP (last 3 results) No results for input(s): BNP in the last 8760 hours. Basic Metabolic Panel: Recent Labs  Lab 08/02/19 0558 08/03/19 0332 08/04/19 0335 08/05/19 0153 08/06/19 0124  NA 131* 129* 127* 133* 137  K 3.2* 3.6 3.4* 3.9 3.9  CL 95* 95* 95* 98 102  CO2 24 22 22  20* 24  GLUCOSE 77 94 85 130* 126*  BUN 5* 7 9 9 9   CREATININE 0.75 0.81 0.92 0.85 0.64  CALCIUM 8.1* 8.1* 8.3* 8.9 9.1   Liver Function Tests: Recent Labs  Lab 08/02/19 0558 08/03/19 0332 08/04/19 0335 08/05/19 0153 08/06/19 0124  AST 93* 84* 88* 87* 72*  ALT 25 26 27 25 28   ALKPHOS 115  110 105 97 90  BILITOT 21.9* 23.8* 26.4* 28.1* 22.5*  PROT 5.1* 5.4* 5.6* 6.4* 6.2*  ALBUMIN 2.3* 2.7* 3.1* 3.5 3.5   No results for input(s): LIPASE, AMYLASE in the last 168 hours. No results for input(s): AMMONIA in the last 168 hours. CBC: Recent Labs  Lab 08/02/19 0558 08/03/19 0332 08/04/19 0335 08/05/19 0153 08/06/19 0124  WBC 17.0* 15.6* 17.5* 14.0* 16.6*  NEUTROABS  --  12.2*  --  11.5* 13.7*  HGB 10.2* 9.7* 10.2* 11.0* 10.4*  HCT 28.6* 27.4* 29.6* 31.6* 29.6*  MCV 102.9*  103.0* 103.9* 105.0* 105.3*  PLT 463* 455* 454* 505* 547*   Cardiac Enzymes: No results for input(s): CKTOTAL, CKMB, CKMBINDEX, TROPONINI in the last 168 hours. BNP: Invalid input(s): POCBNP CBG: No results for input(s): GLUCAP in the last 168 hours. D-Dimer No results for input(s): DDIMER in the last 72 hours. Hgb A1c No results for input(s): HGBA1C in the last 72 hours. Lipid Profile No results for input(s): CHOL, HDL, LDLCALC, TRIG, CHOLHDL, LDLDIRECT in the last 72 hours. Thyroid function studies No results for input(s): TSH, T4TOTAL, T3FREE, THYROIDAB in the last 72 hours.  Invalid input(s): FREET3 Anemia work up No results for input(s): VITAMINB12, FOLATE, FERRITIN, TIBC, IRON, RETICCTPCT in the last 72 hours. Urinalysis    Component Value Date/Time   COLORURINE AMBER (A) 07/30/2019 0805   APPEARANCEUR HAZY (A) 07/30/2019 0805   LABSPEC 1.010 07/30/2019 0805   PHURINE 6.0 07/30/2019 0805   GLUCOSEU NEGATIVE 07/30/2019 0805   HGBUR NEGATIVE 07/30/2019 0805   BILIRUBINUR MODERATE (A) 07/30/2019 0805   KETONESUR NEGATIVE 07/30/2019 0805   PROTEINUR NEGATIVE 07/30/2019 0805   NITRITE NEGATIVE 07/30/2019 0805   LEUKOCYTESUR NEGATIVE 07/30/2019 0805   Sepsis Labs Invalid input(s): PROCALCITONIN,  WBC,  LACTICIDVEN Microbiology Recent Results (from the past 240 hour(s))  SARS CORONAVIRUS 2 (TAT 6-24 HRS) Nasopharyngeal Nasopharyngeal Swab     Status: None   Collection Time: 07/29/19  7:33 PM   Specimen: Nasopharyngeal Swab  Result Value Ref Range Status   SARS Coronavirus 2 NEGATIVE NEGATIVE Final    Comment: (NOTE) SARS-CoV-2 target nucleic acids are NOT DETECTED. The SARS-CoV-2 RNA is generally detectable in upper and lower respiratory specimens during the acute phase of infection. Negative results do not preclude SARS-CoV-2 infection, do not rule out co-infections with other pathogens, and should not be used as the sole basis for treatment or other patient  management decisions. Negative results must be combined with clinical observations, patient history, and epidemiological information. The expected result is Negative. Fact Sheet for Patients: SugarRoll.be Fact Sheet for Healthcare Providers: https://www.woods-mathews.com/ This test is not yet approved or cleared by the [place] FDA and  has been authorized for detection and/or diagnosis of SARS-CoV-2 by FDA under an Emergency Use Authorization (EUA). This EUA will remain  in effect (meaning this test can be used) for the duration of the COVID-19 declaration under Section 56 4(b)(1) of the Act, 21 U.S.C. section 360bbb-3(b)(1), unless the authorization is terminated or revoked sooner. Performed at [facility], [place] [address], [place], [place] [address]      Time coordinating discharge: Over 30 minutes  SIGNED:   Darliss Cheney, MD  [facility] 08/06/2019, 10:13 AM  If 7PM-7AM, please contact night-coverage [email or web address]

## 2019-08-07 ENCOUNTER — Other Ambulatory Visit: Payer: Self-pay | Admitting: *Deleted

## 2019-08-07 ENCOUNTER — Encounter: Payer: Self-pay | Admitting: *Deleted

## 2019-08-07 ENCOUNTER — Telehealth: Payer: Self-pay | Admitting: Family Medicine

## 2019-08-07 NOTE — Patient Outreach (Signed)
[name]) Care Management [place] Telephone Outreach, new referral PCP office completes Transition of Care follow up post-hospital discharge Post-hospital discharge day # 1 Unsuccessful outreach attempt # 1- new referral  08/07/2019  Christopher Carrillo [birth year] [id number]  Unsuccessful telephone outreach to [facility]" [place], 49 y/o male referred to [facility] RN CM 08/06/19 by [facility] Liaison after two recent hospitalizations; patient was initially admitted to hospital April 13-18, 2021 with drug induced hepatic toxicity after elective colonoscopy on July 20, 2019 for recent clonic mass; he was discharged home to self care without home health services in place.  Unfortunately, patient experienced second hospitalization April 21-29, 2021 with jaundice, lower extremity swelling, confusion and was diagnosed with acute liver failure.  He was discharged home yesterday to self-care without home health services in place; patient has follow up scheduled with hepatic clinic in [place], [place] on Aug 26, 2019.  Patient has history including, but not limited to, HTN/ HLD; obesity; alcohol use/ abuse; GERD; anxiety/ depression; and colonic mass.     HIPAA compliant voice mail message left for patient, requesting return call back.  Plan:  [facility] CM unsuccessful patient outreach letter in mail requesting call back in writing  Will re-attempt [place] telephone outreach within 4 business days if I do not hear back from patient first  Oneta Rack, RN, BSN, [employer] [facility] Care Management  [phone]

## 2019-08-07 NOTE — Telephone Encounter (Signed)
MD please advise

## 2019-08-07 NOTE — Telephone Encounter (Signed)
Patient says that the millpred that was prescribed by the hospital is 3000 for 25 days, he cannot afford this  Can something cheaper be called in for him? [facility] [place]   (prednisolone)

## 2019-08-07 NOTE — Telephone Encounter (Signed)
I spoke with patient's gastroenterologist during his admission.  Unfortunately prednisone is the only medication available to treat his acute alcoholic hepatitis.  He cannot be on prednisone as he cannot break down prednisone into the active ingredient.  Due to the cost of medication we will see what the hospital can do to help with cost . I spoke with pt wife for the weekend they are able a  partial fill of the prednisolone until we can figure out a more long-term solution.  Other option will be readmitting him for 4 weeks but this is not cost-effective.

## 2019-08-10 ENCOUNTER — Encounter: Payer: Self-pay | Admitting: Family Medicine

## 2019-08-10 ENCOUNTER — Other Ambulatory Visit: Payer: Self-pay | Admitting: *Deleted

## 2019-08-10 ENCOUNTER — Ambulatory Visit: Payer: 59 | Admitting: Family Medicine

## 2019-08-10 ENCOUNTER — Other Ambulatory Visit: Payer: Self-pay

## 2019-08-10 ENCOUNTER — Encounter: Payer: Self-pay | Admitting: *Deleted

## 2019-08-10 VITALS — BP 126/82 | HR 82 | Temp 98.1°F | Resp 14 | Ht 69.0 in | Wt 189.0 lb

## 2019-08-10 DIAGNOSIS — D696 Thrombocytopenia, unspecified: Secondary | ICD-10-CM | POA: Diagnosis not present

## 2019-08-10 DIAGNOSIS — K625 Hemorrhage of anus and rectum: Secondary | ICD-10-CM | POA: Diagnosis not present

## 2019-08-10 DIAGNOSIS — R600 Localized edema: Secondary | ICD-10-CM | POA: Diagnosis not present

## 2019-08-10 DIAGNOSIS — B179 Acute viral hepatitis, unspecified: Secondary | ICD-10-CM

## 2019-08-10 DIAGNOSIS — R69 Illness, unspecified: Secondary | ICD-10-CM | POA: Diagnosis not present

## 2019-08-10 DIAGNOSIS — D638 Anemia in other chronic diseases classified elsewhere: Secondary | ICD-10-CM | POA: Diagnosis not present

## 2019-08-10 LAB — CBC WITH DIFFERENTIAL/PLATELET
Absolute Monocytes: 1644 cells/uL — ABNORMAL HIGH (ref 200–950)
Basophils Absolute: 20 cells/uL (ref 0–200)
Basophils Relative: 0.1 %
Eosinophils Absolute: 20 cells/uL (ref 15–500)
Eosinophils Relative: 0.1 %
HCT: 31.4 % — ABNORMAL LOW (ref 38.5–50.0)
Hemoglobin: 11.4 g/dL — ABNORMAL LOW (ref 13.2–17.1)
Lymphs Abs: 548 cells/uL — ABNORMAL LOW (ref 850–3900)
MCH: 36 pg — ABNORMAL HIGH (ref 27.0–33.0)
MCHC: 36.3 g/dL — ABNORMAL HIGH (ref 32.0–36.0)
MCV: 99.1 fL (ref 80.0–100.0)
MPV: 9.6 fL (ref 7.5–12.5)
Monocytes Relative: 8.1 %
Neutro Abs: 18067 cells/uL — ABNORMAL HIGH (ref 1500–7800)
Neutrophils Relative %: 89 %
Platelets: 505 10*3/uL — ABNORMAL HIGH (ref 140–400)
RBC: 3.17 10*6/uL — ABNORMAL LOW (ref 4.20–5.80)
RDW: 13.8 % (ref 11.0–15.0)
Total Lymphocyte: 2.7 %
WBC: 20.3 10*3/uL — ABNORMAL HIGH (ref 3.8–10.8)

## 2019-08-10 LAB — COMPREHENSIVE METABOLIC PANEL
AG Ratio: 1.6 (calc) (ref 1.0–2.5)
ALT: 48 U/L — ABNORMAL HIGH (ref 9–46)
AST: 88 U/L — ABNORMAL HIGH (ref 10–40)
Albumin: 3.4 g/dL — ABNORMAL LOW (ref 3.6–5.1)
Alkaline phosphatase (APISO): 131 U/L — ABNORMAL HIGH (ref 36–130)
BUN: 15 mg/dL (ref 7–25)
CO2: 25 mmol/L (ref 20–32)
Calcium: 9.1 mg/dL (ref 8.6–10.3)
Chloride: 103 mmol/L (ref 98–110)
Creat: 0.65 mg/dL (ref 0.60–1.35)
Globulin: 2.1 g/dL (calc) (ref 1.9–3.7)
Glucose, Bld: 124 mg/dL — ABNORMAL HIGH (ref 65–99)
Potassium: 3.8 mmol/L (ref 3.5–5.3)
Sodium: 138 mmol/L (ref 135–146)
Total Bilirubin: 19.2 mg/dL — ABNORMAL HIGH (ref 0.2–1.2)
Total Protein: 5.5 g/dL — ABNORMAL LOW (ref 6.1–8.1)

## 2019-08-10 NOTE — Assessment & Plan Note (Signed)
Patient still in active treatment for acute hepatitis drug-induced versus alcohol induced or combination.  [facility] and is currently on diuretics along with methylprednisolone.  New concern however is the rectal bleeding.  His colonoscopy and biopsy was a few weeks ago.  I did review his scope there was no mention of internal hemorrhoids though this is still a possibility.  He does have rectal discomfort.  His CT scan of the abdomen pelvis recently showed some inflammation along the right colon but there is no suggestion of proctitis.  He is having some loose bowel movements when he has some bright red blood.  We can try decreasing the lactulose to just once a day to see if this makes a difference.  I will check a stat labs today.  We will try to get him a urgent appointment with his new gastroenterologist locally.  If he does have significant anemia noted on his blood work I will route him back to the emergency room

## 2019-08-10 NOTE — Telephone Encounter (Signed)
NOTED

## 2019-08-10 NOTE — Addendum Note (Signed)
Addended by: Vic Blackbird F on: 08/10/2019 07:52 PM   Modules accepted: Orders

## 2019-08-10 NOTE — Assessment & Plan Note (Signed)
Continue duiretics, elevate feet, which helps swelling

## 2019-08-10 NOTE — Assessment & Plan Note (Signed)
Hb at discharge 10.4 , Plt 547

## 2019-08-10 NOTE — Patient Outreach (Signed)
[facility]) Care Management [facility] CM Telephone Outreach, new referral PCP office completes Transition of Care follow up post-hospital discharge Post-hospital discharge day # 4   08/10/2019  Christopher Carrillo [birth year] [id number]  Successful telephone outreach to [facility]" [place], 49 y/o male referred to [facility] RN CM 08/06/19 by [facility] Liaison after two recent hospitalizations; patient was initially admitted to hospital April 13-18, 2021 with drug induced hepatic toxicity after elective colonoscopy on July 20, 2019 for recent clonic mass; he was discharged home to self care without home health services in place.  Unfortunately, patient experienced second hospitalization April 21-29, 2021 with jaundice, lower extremity swelling, confusion and was diagnosed with acute liver failure.  He was discharged home yesterday to self-care without home health services in place; patient has follow up scheduled with hepatic clinic in [place], [place] on Aug 26, 2019.  Patient has history including, but not limited to, HTN/ HLD; obesity; alcohol use/ abuse; GERD; anxiety/ depression; and colonic mass.     HIPAA/ identity verified and [facility] CM services were discussed with patient; patient politely requests "call back next week," stating he has several doctor's appointments this week and prefers to "think about" [facility] CM services and read initial [facility] CM letter which I mailed at time of initial outreach.  Patient call scheduled with him, around his stated preference for next week.  Plan:  Will keep patient status as pending and re-attempt [facility] CM telephone outreach next week, as patient has requested  Oneta Rack, RN, BSN, [place] Coordinator [facility] Care Management  [phone]

## 2019-08-10 NOTE — Patient Instructions (Signed)
Sarna lotion for itching Referral to [facility]  F/U pending results

## 2019-08-10 NOTE — Telephone Encounter (Signed)
Call placed to [facility] to speak with SW to determine if anything can be done to assist with prescription costs.   Was advised that we would need to discuss with nurse case manager. Routed to [place]. Was advised that no referral was placed in hospital, so they were not aware that patient would have issue with prescription. Contacted pharmacy who recommended trying to change to liquid if GI is agreeable.   Call placed to [facility]. Was advised that they do have Prednisolone 35m/5mL. Advised that this may be cheaper, but patient has already filled prescription for all 200tabs. States that discount card was applied and patient paid out of pocket $1600.00.  MD to be made aware.

## 2019-08-10 NOTE — Progress Notes (Addendum)
Subjective:    Patient ID: Christopher Carrillo, male    DOB: [birth year], 49 y.o.   MRN: [id number]  Patient presents for Hospital F/U  Patient here for hospital follow-up.  Please see my last office visit.  He was sent to the emergency room secondary to fluid retention in the setting of his acute hepatitis.  Differential diagnosis includes alcoholic hepatitis versus drug-induced or combination of both.  He had elevated bilirubin that continued to trend up after he had colonoscopy where there was concern for rectal mass.  Biopsy as well as polyps did not return with any cancer.  While readmitted to [facility] he received albumin along with other supportive therapy.  He had liver biopsy performed.  He was started on methylprednisolone.  He is [facility].  Since discharge he continues to have significant swelling on his lower extremities he continues to have weakness and fatigue.  His appetite is fair on a good day.  He has however had bloody stools the past few days.  Initially which is when he wiped but the past few days he now runs the bathroom thinking he has to have a bowel movement and will just have bright red blood.  Sometimes there is stool mixed in other times not so.  He is on lactulose twice daily to help with some of the confusion and toxicity from his hepatitis. Denies any shortness of breath or chest pain.  He is here today with his wife.  He was also started on diuretics Lasix and Aldactone he is due for repeat labs today. Review Of Systems:  GEN- denies fatigue, fever, weight loss,weakness, recent illness HEENT- denies eye drainage, change in vision, nasal discharge, CVS- denies chest pain, palpitations RESP- denies SOB, cough, wheeze ABD- denies N/V, change in stools, abd pain GU- denies dysuria, hematuria, dribbling, incontinence MSK- denies joint pain, muscle aches, injury Neuro- denies headache, dizziness, syncope,  seizure activity       Objective:    BP 126/82   Pulse 82   Temp 98.1 F (36.7 C) (Temporal)   Resp 14   Ht 5' 9"  (1.753 m)   Wt 189 lb (85.7 kg)   SpO2 97%   BMI 27.91 kg/m  GEN- NAD, alert and oriented x3, fatigued appearing HEENT- PERRL, EOMI, icteric sclera, pink conjunctiva, MMM, oropharynx clear Neck- Supple, no LAD CVS- RRR, no murmur RESP-CTAB Skin- generalized jaundice with scleral icterus ABD-NABS,soft, TTP across upper abdomen, distended GU-rectal area of irritation in the gluteal cleft small abrasion at the 6 o'clock position but this is not a complete tear fecal occult blood test positive enlarged prostate EXT- 1+ pitting edema Pulses- Radial 2+, DP diminished         Assessment & Plan:    At end of visit patient also noted that he itches this is in setting of the elevated bilirubin and hepatitis.  He could try Sarna lotion I would not recommend adding Benadryl or any other antihistamine at this time due to his episodes of confusion  Short-term disability he is unable to work in his current state.  We will take him out of work until July 1 this may need to be changed however we will start with this date until we see what his complete treatment will be Problem List Items Addressed This Visit      Unprioritized   Acute hepatitis    Patient still in active treatment for acute hepatitis drug-induced  versus alcohol induced or combination.  [facility] and is currently on diuretics along with methylprednisolone.  New concern however is the rectal bleeding.  His colonoscopy and biopsy was a few weeks ago.  I did review his scope there was no mention of internal hemorrhoids though this is still a possibility.  He does have rectal discomfort.  His CT scan of the abdomen pelvis recently showed some inflammation along the right colon but there is no suggestion of proctitis.  He is having some loose bowel movements when he has some bright red blood.  We can  try decreasing the lactulose to just once a day to see if this makes a difference.  I will check a stat labs today.  We will try to get him a urgent appointment with his new gastroenterologist locally.  If he does have significant anemia noted on his blood work I will route him back to the emergency room      Relevant Orders   Comprehensive metabolic panel   Anemia of chronic disease    Hb at discharge 10.4 , Plt 547      Lower extremity edema    Continue duiretics, elevate feet, which helps swelling      RESOLVED: Thrombocytopenia ([place])   Relevant Orders   CBC with Differential/Platelet   Comprehensive metabolic panel    Other Visit Diagnoses    Rectal bleeding    -  Primary   Relevant Orders   CBC with Differential/Platelet   Comprehensive metabolic panel      Note: This dictation was prepared with Dragon dictation along with smaller phrase technology. Any transcriptional errors that result from this process are unintentional.

## 2019-08-11 ENCOUNTER — Encounter: Payer: Self-pay | Admitting: Family Medicine

## 2019-08-11 NOTE — Telephone Encounter (Signed)
Call placed to [facility]. Was transferred to [facility] [employer] for pharmacy benefits.   PA began for Prednisolone 74m Tab ([place]).   PA approved x12 months. PA 21- [id number] Was advised that there is a claim dollar amount that may be exceeded. If claim amount exceeded, pharmacy is to contact [phone].   Also began Tier Exception. Exception will require faxed form. Form will be sent to office.

## 2019-08-12 ENCOUNTER — Encounter (HOSPITAL_COMMUNITY): Payer: Self-pay | Admitting: Emergency Medicine

## 2019-08-12 ENCOUNTER — Telehealth: Payer: Self-pay | Admitting: *Deleted

## 2019-08-12 ENCOUNTER — Inpatient Hospital Stay (HOSPITAL_COMMUNITY)
Admission: EM | Admit: 2019-08-12 | Discharge: 2019-08-15 | DRG: 394 | Disposition: A | Discharge status: Home / Self Care | Payer: 59 | Attending: Internal Medicine | Admitting: Internal Medicine

## 2019-08-12 ENCOUNTER — Telehealth: Payer: Self-pay | Admitting: Gastroenterology

## 2019-08-12 ENCOUNTER — Emergency Department (HOSPITAL_COMMUNITY): Payer: 59

## 2019-08-12 ENCOUNTER — Other Ambulatory Visit: Payer: Self-pay

## 2019-08-12 DIAGNOSIS — B179 Acute viral hepatitis, unspecified: Secondary | ICD-10-CM | POA: Diagnosis present

## 2019-08-12 DIAGNOSIS — K633 Ulcer of intestine: Secondary | ICD-10-CM

## 2019-08-12 DIAGNOSIS — F329 Major depressive disorder, single episode, unspecified: Secondary | ICD-10-CM | POA: Diagnosis present

## 2019-08-12 DIAGNOSIS — K7011 Alcoholic hepatitis with ascites: Secondary | ICD-10-CM | POA: Diagnosis present

## 2019-08-12 DIAGNOSIS — L409 Psoriasis, unspecified: Secondary | ICD-10-CM | POA: Diagnosis present

## 2019-08-12 DIAGNOSIS — D72829 Elevated white blood cell count, unspecified: Secondary | ICD-10-CM | POA: Diagnosis present

## 2019-08-12 DIAGNOSIS — K7200 Acute and subacute hepatic failure without coma: Secondary | ICD-10-CM | POA: Diagnosis present

## 2019-08-12 DIAGNOSIS — K648 Other hemorrhoids: Principal | ICD-10-CM | POA: Diagnosis present

## 2019-08-12 DIAGNOSIS — K644 Residual hemorrhoidal skin tags: Secondary | ICD-10-CM | POA: Diagnosis present

## 2019-08-12 DIAGNOSIS — E872 Acidosis: Secondary | ICD-10-CM | POA: Diagnosis present

## 2019-08-12 DIAGNOSIS — I10 Essential (primary) hypertension: Secondary | ICD-10-CM | POA: Diagnosis present

## 2019-08-12 DIAGNOSIS — K219 Gastro-esophageal reflux disease without esophagitis: Secondary | ICD-10-CM | POA: Diagnosis present

## 2019-08-12 DIAGNOSIS — D638 Anemia in other chronic diseases classified elsewhere: Secondary | ICD-10-CM | POA: Diagnosis present

## 2019-08-12 DIAGNOSIS — K635 Polyp of colon: Secondary | ICD-10-CM | POA: Diagnosis present

## 2019-08-12 DIAGNOSIS — K921 Melena: Secondary | ICD-10-CM

## 2019-08-12 DIAGNOSIS — D539 Nutritional anemia, unspecified: Secondary | ICD-10-CM | POA: Diagnosis present

## 2019-08-12 DIAGNOSIS — K529 Noninfective gastroenteritis and colitis, unspecified: Secondary | ICD-10-CM | POA: Diagnosis not present

## 2019-08-12 DIAGNOSIS — K5730 Diverticulosis of large intestine without perforation or abscess without bleeding: Secondary | ICD-10-CM | POA: Diagnosis present

## 2019-08-12 DIAGNOSIS — R188 Other ascites: Secondary | ICD-10-CM

## 2019-08-12 DIAGNOSIS — K7040 Alcoholic hepatic failure without coma: Secondary | ICD-10-CM | POA: Diagnosis present

## 2019-08-12 DIAGNOSIS — Z720 Tobacco use: Secondary | ICD-10-CM | POA: Diagnosis present

## 2019-08-12 DIAGNOSIS — E785 Hyperlipidemia, unspecified: Secondary | ICD-10-CM | POA: Diagnosis present

## 2019-08-12 DIAGNOSIS — F17210 Nicotine dependence, cigarettes, uncomplicated: Secondary | ICD-10-CM | POA: Diagnosis present

## 2019-08-12 DIAGNOSIS — T380X5A Adverse effect of glucocorticoids and synthetic analogues, initial encounter: Secondary | ICD-10-CM | POA: Diagnosis present

## 2019-08-12 DIAGNOSIS — D689 Coagulation defect, unspecified: Secondary | ICD-10-CM | POA: Diagnosis present

## 2019-08-12 DIAGNOSIS — D72828 Other elevated white blood cell count: Secondary | ICD-10-CM | POA: Diagnosis present

## 2019-08-12 DIAGNOSIS — K7030 Alcoholic cirrhosis of liver without ascites: Secondary | ICD-10-CM | POA: Diagnosis present

## 2019-08-12 DIAGNOSIS — Z20822 Contact with and (suspected) exposure to covid-19: Secondary | ICD-10-CM | POA: Diagnosis present

## 2019-08-12 DIAGNOSIS — Z03818 Encounter for observation for suspected exposure to other biological agents ruled out: Secondary | ICD-10-CM | POA: Diagnosis not present

## 2019-08-12 DIAGNOSIS — Z79899 Other long term (current) drug therapy: Secondary | ICD-10-CM

## 2019-08-12 DIAGNOSIS — F419 Anxiety disorder, unspecified: Secondary | ICD-10-CM | POA: Diagnosis present

## 2019-08-12 LAB — CBC
HCT: 34.8 % — ABNORMAL LOW (ref 39.0–52.0)
Hemoglobin: 11.6 g/dL — ABNORMAL LOW (ref 13.0–17.0)
MCH: 35.9 pg — ABNORMAL HIGH (ref 26.0–34.0)
MCHC: 33.3 g/dL (ref 30.0–36.0)
MCV: 107.7 fL — ABNORMAL HIGH (ref 80.0–100.0)
Platelets: 505 10*3/uL — ABNORMAL HIGH (ref 150–400)
RBC: 3.23 MIL/uL — ABNORMAL LOW (ref 4.22–5.81)
RDW: 16.9 % — ABNORMAL HIGH (ref 11.5–15.5)
WBC: 24.4 10*3/uL — ABNORMAL HIGH (ref 4.0–10.5)
nRBC: 0 % (ref 0.0–0.2)

## 2019-08-12 LAB — COMPREHENSIVE METABOLIC PANEL
ALT: 59 U/L — ABNORMAL HIGH (ref 0–44)
AST: 95 U/L — ABNORMAL HIGH (ref 15–41)
Albumin: 2.8 g/dL — ABNORMAL LOW (ref 3.5–5.0)
Alkaline Phosphatase: 129 U/L — ABNORMAL HIGH (ref 38–126)
Anion gap: 10 (ref 5–15)
BUN: 12 mg/dL (ref 6–20)
CO2: 23 mmol/L (ref 22–32)
Calcium: 9 mg/dL (ref 8.9–10.3)
Chloride: 103 mmol/L (ref 98–111)
Creatinine, Ser: 0.8 mg/dL (ref 0.61–1.24)
GFR calc Af Amer: >60 mL/min (ref >60)
GFR calc non Af Amer: >60 mL/min (ref >60)
Glucose, Bld: 126 mg/dL — ABNORMAL HIGH (ref 70–99)
Potassium: 3.8 mmol/L (ref 3.5–5.1)
Sodium: 136 mmol/L (ref 135–145)
Total Bilirubin: 17.3 mg/dL — ABNORMAL HIGH (ref 0.3–1.2)
Total Protein: 5.7 g/dL — ABNORMAL LOW (ref 6.5–8.1)

## 2019-08-12 LAB — TYPE AND SCREEN
ABO/RH(D): O POS
Antibody Screen: NEGATIVE

## 2019-08-12 LAB — ABO/RH: ABO/RH(D): O POS

## 2019-08-12 LAB — POC OCCULT BLOOD, ED: Fecal Occult Bld: POSITIVE — AB

## 2019-08-12 MED ORDER — IOHEXOL 300 MG/ML  SOLN
100.0000 mL | Freq: Once | INTRAMUSCULAR | Status: AC | PRN
  Administered 2019-08-12: 100 mL via INTRAVENOUS

## 2019-08-12 MED ORDER — ONDANSETRON HCL 4 MG/2ML IJ SOLN: 4.0000 mg | Freq: Four times a day (QID) | INTRAMUSCULAR | Status: DC | PRN

## 2019-08-12 MED ORDER — ONDANSETRON HCL 4 MG PO TABS: 4.0000 mg | ORAL_TABLET | Freq: Four times a day (QID) | ORAL | Status: DC | PRN

## 2019-08-12 MED ORDER — SODIUM CHLORIDE 0.9 % IV BOLUS
1000.0000 mL | Freq: Once | INTRAVENOUS | Status: AC
  Administered 2019-08-13: 1000 mL via INTRAVENOUS

## 2019-08-12 NOTE — Telephone Encounter (Signed)
Received call from patient.   Reports that he has increased gross blood from rectum. States that blood is still bright red, and there is no clotting. States that he is having bleeding even when he does not have BM.   MD made aware and recommendations are as follows: 1. Schedule urgent appointment with GI 2. If unable to schedule appointment today or tomorrow at latest, return to ER for evaluation  Call placed to [place], office of Dr Carmell Austria. Information given to triage to work patient in with one of the MD's as case if very complex.   Will call back with appointment time.

## 2019-08-12 NOTE — Telephone Encounter (Signed)
Please continue with referral as patient will still need to be seen after ER/ hospitalization.   Per Dr. Buelah Manis,  PCP:  Pt would like to continue following with Dr. Lyndel Safe at [facility], Dr. Rondel Baton also saw him, he lives in [place], this is closer, due to complexity, admissions need to be done at [facility]. He only had 1 visit with [facility] before he was admitted to [facility], then readmitted to [facility]  Thank you.

## 2019-08-12 NOTE — H&P (Signed)
History and Physical    Christopher Carrillo OXB:[id number] DOB: 1970/06/08 DOA: 08/12/2019  PCP: Alycia Rossetti, MD   Patient coming from: Home.   I have personally briefly reviewed patient's old medical records in [place]  Chief Complaint: Rectal bleeding.  HPI: Christopher Carrillo is a 49 y.o. male with medical history significant of abdominal pain, alcoholic hepatitis, macrocytic anemia, hypertension, psoriasis, EtOH abuse, tobacco use who is coming to the emergency department referred by his PCP the due to persistent rectal bleeding for the past 5 days associated with abdominal pain and distention.  Mild nausea, but no hematemesis.  He denies constipation.  He denies dysuria, frequency or hematuria.  He denies fever, chills, rhinorrhea, sore throat, wheezing or hemoptysis.  He occasionally feels dyspneic, but denies CP, palpitations, dizziness, diaphoresis, but has lower extremity edema.  He had a colonoscopy on 07/08/2019 due to what felt to be a colon mass, but subsequently he was diagnosed with intermittent ileocecal intussusception after he had benign enteric mucosa findings.  The patient was admitted from 07/21/2019 to 07/26/2019 and treated for confusion, colitis and rising bilirubin level at [facility].  It he was believed that he may have developed hepatitis induced by ketamine and propofol in the setting of chronic alcohol abuse.  He was subsequently admitted again at [facility] for similar symptoms but now with lower extremity edema and confusion.  His ammonia was slightly elevated and he was started on lactulose.  He was discharged on prednisolone for possible alcohol induced hepatitis.  He was put on prednisolone for alcoholic hepatitis, furosemide, Aldactone and lactulose at discharge with follow-up with GI (Dr. Bryan Lemma).  [facility] [place] [facility] 08/26/2019.  ED Course: Initial vital signs were temperature 99.2 F, pulse 97, respiration 14, blood  pressure 111/63 mmHg and O2 sat 100%.  The patient was given at 1000 mL bolus in the ER.  White count is 24.4, hemoglobin 11.6 g/dL and platelets 505.  CMP shows normal electrolytes and renal function.  Glucose 126 mg/dL.  Total protein is 5.7, albumin 2.8 g/dL.  AST is 95, ALT 59 and alkaline phosphatase 129 units/L.  Total bilirubin was 17.3 mg/dL.  Imaging: CT abdomen/pelvis with contrast showed diffusely decreased hepatic hypoattenuation with heterogeneous enhancement, likely to correlate with recent diagnosis of hepatitis with fibrosis and asteatosis.  There is small to moderate volume ascites throughout the abdomen which is new and concerning for portal hypertension.  Slight mucosal hyperemia and edematous changes of much of the small bowel as well as throughout the colon.  Mild bladder wall thickening.  Please see images and full radiology report for further detail.  Review of Systems: As per HPI otherwise all other systems reviewed and are negative.  Past Medical History:  Diagnosis Date  . Abdominal pain   . Alcoholic hepatitis without ascites   . Anemia   . Elevated bilirubin   . Essential hypertension   . Psoriasis   . Transaminitis     Past Surgical History:  Procedure Laterality Date  . BIOPSY  07/20/2019   Procedure: BIOPSY;  Surgeon: Daneil Dolin, MD;  [facility];  Service: Endoscopy;;  . COLONOSCOPY WITH PROPOFOL N/A 07/20/2019   Procedure: COLONOSCOPY WITH PROPOFOL;  Surgeon: Daneil Dolin, MD;  [facility];  Service: Endoscopy;  Laterality: N/A;  10:15am  . NO PAST SURGERIES    . POLYPECTOMY  07/20/2019   Procedure: POLYPECTOMY;  Surgeon: Manus Rudd  M, MD;  Location: [facility];  Service: Endoscopy;;    Social History  reports that he has been smoking cigarettes. He has been smoking about 0.33 packs per day. He has never used smokeless tobacco. He reports previous alcohol use. He reports that he does not use drugs.  Allergies    Allergen Reactions  . Other Other (See Comments)    Sea weed salad : swelling of gums.    Family History  Problem Relation Age of Onset  . Asthma Father   . Cancer Sister 18       Pharyngeal  . Colon cancer Neg Hx   . Liver disease Neg Hx    Prior to Admission medications   Medication Sig Start Date End Date Taking? Authorizing Provider  folic acid (FOLVITE) 1 MG tablet Take 1 tablet (1 mg total) by mouth daily. 07/27/19  Yes Johnson, Clanford L, MD  furosemide (LASIX) 20 MG tablet Take 1 tablet (20 mg total) by mouth daily. 08/07/19 09/06/19 Yes Pahwani, Einar Grad, MD  Hyprom-Naphaz-Polysorb-Zn Sulf (CLEAR EYES COMPLETE OP) Place 1 drop into both eyes daily as needed (for dry eyes).   Yes [provider]  lactulose (CHRONULAC) 10 GM/15ML solution Take 15 mLs (10 g total) by mouth 2 (two) times daily. Reduce or stop taking if you have more than 3 bowel movements a day 08/06/19 09/05/19 Yes Pahwani, Einar Grad, MD  Multiple Vitamin (MULTIVITAMIN WITH MINERALS) TABS tablet Take 1 tablet by mouth daily. 07/27/19  Yes Johnson, Clanford L, MD  Multiple Vitamin (MULTIVITAMIN WITH MINERALS) TABS tablet Take 1 tablet by mouth daily.   Yes [provider]  oxycodone (OXY-IR) 5 MG capsule Take 5 mg by mouth every 4 (four) hours as needed.   Yes [provider]  prednisoLONE 5 MG TABS tablet Take 8 tablets (40 mg total) by mouth daily for 25 days. 08/07/19 09/01/19 Yes Pahwani, Einar Grad, MD  spironolactone (ALDACTONE) 50 MG tablet Take 1 tablet (50 mg total) by mouth daily. 08/07/19 09/06/19 Yes Pahwani, Einar Grad, MD  tamsulosin (FLOMAX) 0.4 MG CAPS capsule Take 1 capsule (0.4 mg total) by mouth daily after supper. 07/26/19  Yes Johnson, Clanford L, MD  polyethylene glycol (MIRALAX / GLYCOLAX) 17 g packet Take 17 g by mouth daily as needed for mild constipation. Patient not taking: Reported on 08/10/2019 07/26/19   Murlean Iba, MD    Physical Exam: Vitals:   08/12/19 2045 08/12/19 2115  08/12/19 2130 08/12/19 2215  BP: 112/60 113/60 (!) 110/54 (!) 100/54  Pulse: 85 80 85 79  Resp: 17 18 20 13   Temp:      TempSrc:      SpO2: 99% 100% 97% 95%  Weight:      Height:        Constitutional: Looks chronically ill. Eyes: PERRL, lids and conjunctivae normal.  Positive scleral icterus. ENMT: Mucous membranes are moist. Posterior pharynx clear of any exudate or lesions. Neck: normal, supple, no masses, no thyromegaly Respiratory: Decreased breath sounds on bases, otherwise clear to auscultation bilaterally, no wheezing, no crackles. Normal respiratory effort. No accessory muscle use.  Cardiovascular: Regular rate and rhythm, no murmurs / rubs / gallops. No extremity edema. 2+ pedal pulses. No carotid bruits.  Abdomen: Ascites.  Soft, positive RUQ tenderness, no guarding, no rebound or masses palpated.  Bowel sounds positive.  Musculoskeletal: no clubbing / cyanosis.  Good ROM, no contractures. Normal muscle tone.  Skin: Positive icterus with some areas of ecchymosis. Neurologic: CN 2-12 grossly  intact. Sensation intact, DTR normal. Strength 5/5 in all 4.  Psychiatric: Normal judgment and insight. Alert and oriented x 3. Normal mood.   Labs on Admission: I have personally reviewed following labs and imaging studies  CBC: Recent Labs  Lab 08/06/19 0124 08/10/19 1626 08/12/19 1646  WBC 16.6* 20.3* 24.4*  NEUTROABS 13.7* 18,067*  --   HGB 10.4* 11.4* 11.6*  HCT 29.6* 31.4* 34.8*  MCV 105.3* 99.1 107.7*  PLT 547* 505* 505*    Basic Metabolic Panel: Recent Labs  Lab 08/06/19 0124 08/10/19 1626 08/12/19 1646  NA 137 138 136  K 3.9 3.8 3.8  CL 102 103 103  CO2 24 25 23   GLUCOSE 126* 124* 126*  BUN 9 15 12   CREATININE 0.64 0.65 0.80  CALCIUM 9.1 9.1 9.0    GFR: Estimated Creatinine Clearance: 123.8 mL/min (by C-G formula based on SCr of 0.8 mg/dL).  Liver Function Tests: Recent Labs  Lab 08/06/19 0124 08/10/19 1626 08/12/19 1646  AST 72* 88* 95*  ALT 28  48* 59*  ALKPHOS 90  --  129*  BILITOT 22.5* 19.2* 17.3*  PROT 6.2* 5.5* 5.7*  ALBUMIN 3.5  --  2.8*    Urine analysis:    Component Value Date/Time   COLORURINE AMBER (A) 07/30/2019 0805   APPEARANCEUR HAZY (A) 07/30/2019 0805   LABSPEC 1.010 07/30/2019 0805   PHURINE 6.0 07/30/2019 0805   GLUCOSEU NEGATIVE 07/30/2019 0805   HGBUR NEGATIVE 07/30/2019 0805   BILIRUBINUR MODERATE (A) 07/30/2019 0805   KETONESUR NEGATIVE 07/30/2019 0805   PROTEINUR NEGATIVE 07/30/2019 0805   NITRITE NEGATIVE 07/30/2019 0805   LEUKOCYTESUR NEGATIVE 07/30/2019 0805    Radiological Exams on Admission: CT ABDOMEN PELVIS W CONTRAST  Result Date: 08/12/2019 CLINICAL DATA:  Five days of rectal bleeding, jaundice, positive Hemoccult EXAM: CT ABDOMEN AND PELVIS WITH CONTRAST TECHNIQUE: Multidetector CT imaging of the abdomen and pelvis was performed using the standard protocol following bolus administration of intravenous contrast. CONTRAST:  172m OMNIPAQUE IOHEXOL 300 MG/ML  SOLN COMPARISON:  Ultrasound 07/31/2019, CT 07/21/2019 FINDINGS: Lower chest: Lung bases are clear. Normal heart size. No pericardial effusion. Coronary artery calcification. Hepatobiliary: Diffusely decreased hepatic hypoattenuation with heterogeneous enhancement, may correlate with recent diagnosis of hepatitis and underlying steatosis. Gallbladder and biliary tree is normal. Pancreas: Unremarkable. No pancreatic ductal dilatation or surrounding inflammatory changes. Spleen: Normal in size without focal abnormality. Adrenals/Urinary Tract: Normal adrenals. Kidneys enhance and excrete symmetrically. No concerning renal mass, urolithiasis or hydronephrosis. Mild bladder wall thickening. Stomach/Bowel: Distal esophagus stomach and duodenum free of significant abnormality. Slight mucosal hyperemia and edematous changes of much of the small bowel as well as throughout the colon is similar to slightly more pronounced than on comparison exam on a  background of intramural fat within the ascending colon. Scattered colonic diverticula without focal pericolonic inflammation to suggest diverticulitis. Normal appearance of the appendix. Vascular/Lymphatic: The aorta is normal caliber. No suspicious or enlarged lymph nodes in the included lymphatic chains. Reproductive: The prostate and seminal vesicles are unremarkable. Other: Small to moderate volume ascites throughout the abdomen. This is new from comparison exam and may reflect some developing portal hypertension in the setting of hepatopathy. No abdominopelvic free air. Musculoskeletal: No acute osseous abnormality or suspicious osseous lesions. IMPRESSION: 1. Diffusely decreased hepatic hypoattenuation with heterogeneous enhancement, likely to correlate with recent diagnosis of hepatitis with fibrosis and steatosis. 2. Small to moderate volume ascites throughout the abdomen is new from comparison exam and is concerning for portal  hypertension in the setting of hepatopathy. 3. Slight mucosal hyperemia and edematous changes of much of the small bowel as well as throughout the colon is similar to slightly more pronounced than on comparison exam. Somewhat more nonspecific in the setting of ascites and liver disease though certainly an enterocolitis could have this appearance. 4. Mild bladder wall thickening. Recommend correlation with urinalysis to exclude cystitis. Electronically Signed   By: Lovena Le M.D.   On: 08/12/2019 22:56    EKG: Independently reviewed.   Assessment/Plan Principal Problem:   Hematochezia Observation/telemetry. Keep n.p.o. Monitor H&H. Transfuse as needed. Protonix IV every 24 hours. GI will evaluate later today.  Active Problems:   Acute hepatitis   Acute liver failure without hepatic coma Continue prednisolone. Trend hepatic function panel. Continue diuretics for ascites. Consult GI. He will be seen by the liver clinic on 08/26/2019.    Tobacco use  Declined  nicotine replacement therapy. He stated he will quit on his own. Staff to provide tobacco cessation information.    Essential hypertension Continue furosemide and spironolactone. Monitor blood pressure. Monitor renal function electrolytes.    Anemia of chronic disease Monitor H&H.    Leukocytosis Secondary to prednisolone use.   DVT prophylaxis: SCDs. Code Status:   Full code. Family Communication:   Disposition Plan:   Patient is from:  Home.  Anticipated DC to:  Home.  Anticipated DC date:  08/14/2019  Anticipated DC barriers: Clinical improvement and GI consult. Consults called:  GI told EDP they will evaluate in a.m. Admission status:  Observation/telemetry.  Severity of Illness:  Moderate to high.  Reubin Milan MD [facility]  How to contact the [facility] Attending or Consulting provider [place] or covering provider during after hours [place], for this patient?   1. Check the care team in [facility] and look for a) attending/consulting [facility] provider listed and b) the [facility] team listed 2. Log into [email or web address] and use [place]'s universal password to access. If you do not have the password, please contact the hospital operator. 3. Locate the [facility] provider you are looking for under [facility] and page to a number that you can be directly reached. 4. If you still have difficulty reaching the provider, please page the [facility] (Director on Call) for the Hospitalists listed on amion for assistance.  08/12/2019, 11:54 PM   This document was prepared using Dragon voice recognition software and may contain some unintended transcription errors.

## 2019-08-12 NOTE — ED Triage Notes (Signed)
Patient sent by PCP for rectal bleeding x 5 days. Patient appears jaundice. C/o generalized abdominal pain and distention. Reports positive occult at PCP this week.

## 2019-08-12 NOTE — Telephone Encounter (Signed)
Received return call from GI office.   Was advised triage nurse discussed with MD and advised that patient return to ER if actively bleeding with significant amount of blood in each stool.   Call placed to patient and patient made aware.   Call report to be called to [facility].

## 2019-08-12 NOTE — ED Provider Notes (Signed)
[facility] EMERGENCY DEPARTMENT Provider Note   CSN: [id number] Arrival date & time: 08/12/19  1606     History Chief Complaint  Patient presents with  . Rectal Bleeding    Christopher Carrillo is a 49 y.o. male.  Patient is a 49 year old male with past medical history of hypertension and recent colon issues for which he received a colonoscopy. Afterward, the patient developed acute liver failure, the etiology of which seems to be alcohol related, however the patient denies that he was ever a significant drinker. Patient presents today with complaints of passing blood per rectum. He describes going to the bathroom for the past 5 days and noticing what he describes as "Kool-Aid" coming from his rectum. He states the blood layers out on the bottom of the toilet. He denies to me he is experiencing any shortness of breath or dizziness. He does describe generalized abdominal pain, however this has been ongoing for several weeks. He denies any fevers or chills. He denies any rectal pain, constipation, or having to strain with bowel movements.  The history is provided by the patient.       Past Medical History:  Diagnosis Date  . Abdominal pain   . Alcoholic hepatitis without ascites   . Anemia   . Elevated bilirubin   . Essential hypertension   . Psoriasis   . Transaminitis     Patient Active Problem List   Diagnosis Date Noted  . Acute hepatitis   . Coagulopathy ([place])   . Acute liver failure without hepatic coma   . Lower extremity edema 07/29/2019  . Urinary hesitancy 07/29/2019  . Anemia of chronic disease 07/29/2019  . Drug-induced hepatic toxicity 07/25/2019  . RUQ pain   . Elevated bilirubin   . Alcoholic hepatitis without ascites   . Elevated liver enzymes 07/21/2019  . Elevated LFTs 07/14/2019  . Colonic mass 07/14/2019  . MDD (major depressive disorder) 05/25/2019  . Hepatic steatosis 02/18/2019  . GAD (generalized anxiety disorder) 02/18/2019  .  Atypical chest pain 04/08/2013  . GERD (gastroesophageal reflux disease) 04/08/2013  . Rectal leakage 04/08/2013  . Abdominal pain 04/07/2013  . Hyperlipidemia 12/19/2012  . Essential hypertension 12/19/2012  . Routine general medical examination at a health care facility 08/07/2012  . Psoriasis 05/04/2012  . Obese 05/04/2012  . Tobacco use 05/04/2012    Past Surgical History:  Procedure Laterality Date  . BIOPSY  07/20/2019   Procedure: BIOPSY;  Surgeon: Daneil Dolin, MD;  Location: [facility];  Service: Endoscopy;;  . COLONOSCOPY WITH PROPOFOL N/A 07/20/2019   Procedure: COLONOSCOPY WITH PROPOFOL;  Surgeon: Daneil Dolin, MD;  Location: [facility];  Service: Endoscopy;  Laterality: N/A;  10:15am  . NO PAST SURGERIES    . POLYPECTOMY  07/20/2019   Procedure: POLYPECTOMY;  Surgeon: Daneil Dolin, MD;  Location: [facility];  Service: Endoscopy;;       Family History  Problem Relation Age of Onset  . Asthma Father   . Cancer Sister 18       Pharyngeal  . Colon cancer Neg Hx   . Liver disease Neg Hx     Social History   Tobacco Use  . Smoking status: Current Every Day Smoker    Packs/day: 0.33    Types: Cigarettes  . Smokeless tobacco: Never Used  Substance Use Topics  . Alcohol use: Not Currently    Comment: 3-4 beers/night  . Drug use: No    Home  Medications Prior to Admission medications   Medication Sig Start Date End Date Taking? Authorizing Provider  folic acid (FOLVITE) 1 MG tablet Take 1 tablet (1 mg total) by mouth daily. 07/27/19  Yes Johnson, Clanford L, MD  furosemide (LASIX) 20 MG tablet Take 1 tablet (20 mg total) by mouth daily. 08/07/19 09/06/19 Yes Pahwani, Einar Grad, MD  Hyprom-Naphaz-Polysorb-Zn Sulf (CLEAR EYES COMPLETE OP) Place 1 drop into both eyes daily as needed (for dry eyes).   Yes [provider]  lactulose (CHRONULAC) 10 GM/15ML solution Take 15 mLs (10 g total) by mouth 2 (two) times daily. Reduce or stop taking if you have  more than 3 bowel movements a day 08/06/19 09/05/19 Yes Pahwani, Einar Grad, MD  Multiple Vitamin (MULTIVITAMIN WITH MINERALS) TABS tablet Take 1 tablet by mouth daily. 07/27/19  Yes Johnson, Clanford L, MD  Multiple Vitamin (MULTIVITAMIN WITH MINERALS) TABS tablet Take 1 tablet by mouth daily.   Yes [provider]  oxycodone (OXY-IR) 5 MG capsule Take 5 mg by mouth every 4 (four) hours as needed.   Yes [provider]  prednisoLONE 5 MG TABS tablet Take 8 tablets (40 mg total) by mouth daily for 25 days. 08/07/19 09/01/19 Yes Pahwani, Einar Grad, MD  spironolactone (ALDACTONE) 50 MG tablet Take 1 tablet (50 mg total) by mouth daily. 08/07/19 09/06/19 Yes Pahwani, Einar Grad, MD  tamsulosin (FLOMAX) 0.4 MG CAPS capsule Take 1 capsule (0.4 mg total) by mouth daily after supper. 07/26/19  Yes Johnson, Clanford L, MD  polyethylene glycol (MIRALAX / GLYCOLAX) 17 g packet Take 17 g by mouth daily as needed for mild constipation. Patient not taking: Reported on 08/10/2019 07/26/19   Murlean Iba, MD    Allergies    Other  Review of Systems   Review of Systems  All other systems reviewed and are negative.   Physical Exam Updated Vital Signs BP 112/60   Pulse 85   Temp 97.9 F (36.6 C) (Oral)   Resp 17   Ht 5' 9"  (1.753 m)   Wt 85.7 kg   SpO2 99%   BMI 27.91 kg/m   Physical Exam Vitals and nursing note reviewed.  Constitutional:      General: He is not in acute distress.    Appearance: He is well-developed. He is not diaphoretic.  HENT:     Head: Normocephalic and atraumatic.  Cardiovascular:     Rate and Rhythm: Normal rate and regular rhythm.     Heart sounds: No murmur. No friction rub.  Pulmonary:     Effort: Pulmonary effort is normal. No respiratory distress.     Breath sounds: Normal breath sounds. No wheezing or rales.  Abdominal:     General: Bowel sounds are normal. There is no distension.     Palpations: Abdomen is soft.     Tenderness: There is no abdominal  tenderness.  Genitourinary:    Rectum: Normal.     Comments: The rectal examination is essentially unremarkable. There are no masses and no external hemorrhoids. The stool is brown in color, but is heme positive. Musculoskeletal:        General: Normal range of motion.     Cervical back: Normal range of motion and neck supple.  Skin:    General: Skin is warm and dry.  Neurological:     Mental Status: He is alert and oriented to person, place, and time.     Coordination: Coordination normal.     ED Results / Procedures / Treatments  Labs (all labs ordered are listed, but only abnormal results are displayed) Labs Reviewed  COMPREHENSIVE METABOLIC PANEL - Abnormal; Notable for the following components:      Result Value   Glucose, Bld 126 (*)    Total Protein 5.7 (*)    Albumin 2.8 (*)    AST 95 (*)    ALT 59 (*)    Alkaline Phosphatase 129 (*)    Total Bilirubin 17.3 (*)    All other components within normal limits  CBC - Abnormal; Notable for the following components:   WBC 24.4 (*)    RBC 3.23 (*)    Hemoglobin 11.6 (*)    HCT 34.8 (*)    MCV 107.7 (*)    MCH 35.9 (*)    RDW 16.9 (*)    Platelets 505 (*)    All other components within normal limits  POC OCCULT BLOOD, ED - Abnormal; Notable for the following components:   Fecal Occult Bld POSITIVE (*)    All other components within normal limits  POC OCCULT BLOOD, ED  TYPE AND SCREEN  ABO/RH    EKG None  Radiology No results found.  Procedures Procedures (including critical care time)  Medications Ordered in ED Medications - No data to display  ED Course  I have reviewed the triage vital signs and the nursing notes.  Pertinent labs & imaging results that were available during my care of the patient were reviewed by me and considered in my medical decision making (see chart for details).    MDM Rules/Calculators/A&P  Patient with recent diagnosis of hepatitis, felt to be related to alcohol.  He  presents today for evaluation of abdominal discomfort and hematochezia.  Patient is afebrile and vitals are stable.  Patient's work-up today reveals a hemoglobin of 10.6 which is above recent baseline.  His bilirubin is 17 which has been trending downward since his hepatitis diagnosis.  Patient today does have a white count of 24,000 along with findings of possible enterocolitis and new ascites.  These findings were discussed with Dr. Ardis Hughs from gastroenterology.  It is his suggestion the patient be admitted for further work-up.  He requests holding off on antibiotics until a sample of his ascites is obtained.  I have discussed with Dr. Olevia Bowens from the hospitalist service who agrees to admit.  Final Clinical Impression(s) / ED Diagnoses Final diagnoses:  None    Rx / DC Orders ED Discharge Orders    None       Veryl Speak, MD 08/12/19 2345

## 2019-08-12 NOTE — Telephone Encounter (Signed)
Spoke to Zap to let her know that the referral has been sent to the [place] office and if the patient needed to be seen tomorrow then he will most likely see one of our Extenders. She stated that this patient was too complex and needed to be seen by a Medical Doctor no later then tomorrow. I explained to Carmell Austria that all our Extenders are very knowledgeable,and if they needed any advice from one of our Doctors then it would be taken care of at the office visit. She continued to insist that the patient needs to be seen by a Doctor. She was transferred to [facility] to schedule an appointment with the next available and if this did not meet her satisfaction then the patient may need to go to the ER for evaluation.

## 2019-08-12 NOTE — Telephone Encounter (Signed)
Spoke with Carmell Austria from PCP office. She wanted pt to be seen by a MD, she stated that pt was actively bleeding. I told her that since pt was actively bleeding and was already established at [facility], he will need to either see his current GI at [facility] or go to the ED for urgent evaluation. She did not want to persist with referral.

## 2019-08-12 NOTE — Telephone Encounter (Signed)
Hi Larena Glassman, could you please have Dr. Lyndel Safe or Dr. Loletha Grayer reviewed pt's records and advise on scheduling? Pt is being referred by his PCP for rectal bleeding. He is established with [facility] but would like to transfer his care because the [facility] office is closer to his house. Thank you.

## 2019-08-12 NOTE — Telephone Encounter (Signed)
Hi Larena Glassman, we have received an urgent referral from pt's PCP for active rectal bleeding. Carmell Austria with PCP office called requesting pt be seen either today or tomorrow. Pt was recently seen at [facility]. However, he wants to transfer his care to either Dr. Lyndel Safe or Dr. Loletha Grayer because [facility] location is closer to his house. Please call Carmell Austria at [phone]. It is fine to leave a detailed message if she does not answer the phone. Thank you.

## 2019-08-12 NOTE — Telephone Encounter (Signed)
LMOM for Christopher Carrillo to call back

## 2019-08-12 NOTE — Telephone Encounter (Signed)
Please see below conversation and advise  Christopher Carrillo

## 2019-08-12 NOTE — ED Notes (Signed)
Pt taken to CT via stretcher in stable condition with face mask in place.

## 2019-08-13 ENCOUNTER — Observation Stay (HOSPITAL_COMMUNITY): Payer: 59

## 2019-08-13 ENCOUNTER — Encounter (HOSPITAL_COMMUNITY): Payer: Self-pay | Admitting: Internal Medicine

## 2019-08-13 DIAGNOSIS — B179 Acute viral hepatitis, unspecified: Secondary | ICD-10-CM

## 2019-08-13 DIAGNOSIS — Z79899 Other long term (current) drug therapy: Secondary | ICD-10-CM | POA: Diagnosis not present

## 2019-08-13 DIAGNOSIS — K635 Polyp of colon: Secondary | ICD-10-CM | POA: Diagnosis not present

## 2019-08-13 DIAGNOSIS — L409 Psoriasis, unspecified: Secondary | ICD-10-CM | POA: Diagnosis not present

## 2019-08-13 DIAGNOSIS — K5730 Diverticulosis of large intestine without perforation or abscess without bleeding: Secondary | ICD-10-CM | POA: Diagnosis not present

## 2019-08-13 DIAGNOSIS — D72828 Other elevated white blood cell count: Secondary | ICD-10-CM | POA: Diagnosis present

## 2019-08-13 DIAGNOSIS — I10 Essential (primary) hypertension: Secondary | ICD-10-CM | POA: Diagnosis not present

## 2019-08-13 DIAGNOSIS — D638 Anemia in other chronic diseases classified elsewhere: Secondary | ICD-10-CM | POA: Diagnosis present

## 2019-08-13 DIAGNOSIS — K648 Other hemorrhoids: Secondary | ICD-10-CM | POA: Diagnosis not present

## 2019-08-13 DIAGNOSIS — F419 Anxiety disorder, unspecified: Secondary | ICD-10-CM | POA: Diagnosis present

## 2019-08-13 DIAGNOSIS — T380X5A Adverse effect of glucocorticoids and synthetic analogues, initial encounter: Secondary | ICD-10-CM | POA: Diagnosis present

## 2019-08-13 DIAGNOSIS — K7011 Alcoholic hepatitis with ascites: Secondary | ICD-10-CM | POA: Diagnosis present

## 2019-08-13 DIAGNOSIS — R69 Illness, unspecified: Secondary | ICD-10-CM | POA: Diagnosis not present

## 2019-08-13 DIAGNOSIS — K219 Gastro-esophageal reflux disease without esophagitis: Secondary | ICD-10-CM | POA: Diagnosis present

## 2019-08-13 DIAGNOSIS — D689 Coagulation defect, unspecified: Secondary | ICD-10-CM | POA: Diagnosis not present

## 2019-08-13 DIAGNOSIS — K921 Melena: Secondary | ICD-10-CM | POA: Diagnosis not present

## 2019-08-13 DIAGNOSIS — E872 Acidosis: Secondary | ICD-10-CM | POA: Diagnosis not present

## 2019-08-13 DIAGNOSIS — K644 Residual hemorrhoidal skin tags: Secondary | ICD-10-CM | POA: Diagnosis not present

## 2019-08-13 DIAGNOSIS — E785 Hyperlipidemia, unspecified: Secondary | ICD-10-CM | POA: Diagnosis present

## 2019-08-13 DIAGNOSIS — D539 Nutritional anemia, unspecified: Secondary | ICD-10-CM | POA: Diagnosis present

## 2019-08-13 DIAGNOSIS — F17210 Nicotine dependence, cigarettes, uncomplicated: Secondary | ICD-10-CM | POA: Diagnosis present

## 2019-08-13 DIAGNOSIS — Z20822 Contact with and (suspected) exposure to covid-19: Secondary | ICD-10-CM | POA: Diagnosis not present

## 2019-08-13 DIAGNOSIS — F329 Major depressive disorder, single episode, unspecified: Secondary | ICD-10-CM | POA: Diagnosis present

## 2019-08-13 DIAGNOSIS — R188 Other ascites: Secondary | ICD-10-CM | POA: Diagnosis not present

## 2019-08-13 LAB — CBC WITH DIFFERENTIAL/PLATELET
Abs Immature Granulocytes: 0.18 10*3/uL — ABNORMAL HIGH (ref 0.00–0.07)
Basophils Absolute: 0 10*3/uL (ref 0.0–0.1)
Basophils Relative: 0 %
Eosinophils Absolute: 0 10*3/uL (ref 0.0–0.5)
Eosinophils Relative: 0 %
HCT: 31.6 % — ABNORMAL LOW (ref 39.0–52.0)
Hemoglobin: 10.6 g/dL — ABNORMAL LOW (ref 13.0–17.0)
Immature Granulocytes: 1 %
Lymphocytes Relative: 9 %
Lymphs Abs: 1.8 10*3/uL (ref 0.7–4.0)
MCH: 36.1 pg — ABNORMAL HIGH (ref 26.0–34.0)
MCHC: 33.5 g/dL (ref 30.0–36.0)
MCV: 107.5 fL — ABNORMAL HIGH (ref 80.0–100.0)
Monocytes Absolute: 1.6 10*3/uL — ABNORMAL HIGH (ref 0.1–1.0)
Monocytes Relative: 8 %
Neutro Abs: 16.3 10*3/uL — ABNORMAL HIGH (ref 1.7–7.7)
Neutrophils Relative %: 82 %
Platelets: 452 10*3/uL — ABNORMAL HIGH (ref 150–400)
RBC: 2.94 MIL/uL — ABNORMAL LOW (ref 4.22–5.81)
RDW: 16.7 % — ABNORMAL HIGH (ref 11.5–15.5)
WBC: 19.8 10*3/uL — ABNORMAL HIGH (ref 4.0–10.5)
nRBC: 0 % (ref 0.0–0.2)

## 2019-08-13 LAB — URINALYSIS, ROUTINE W REFLEX MICROSCOPIC
Glucose, UA: NEGATIVE mg/dL
Hgb urine dipstick: NEGATIVE
Ketones, ur: NEGATIVE mg/dL
Leukocytes,Ua: NEGATIVE
Nitrite: NEGATIVE
Protein, ur: NEGATIVE mg/dL
Specific Gravity, Urine: 1.018 (ref 1.005–1.030)
pH: 6 (ref 5.0–8.0)

## 2019-08-13 LAB — COMPREHENSIVE METABOLIC PANEL
ALT: 55 U/L — ABNORMAL HIGH (ref 0–44)
AST: 86 U/L — ABNORMAL HIGH (ref 15–41)
Albumin: 2.6 g/dL — ABNORMAL LOW (ref 3.5–5.0)
Alkaline Phosphatase: 111 U/L (ref 38–126)
Anion gap: 12 (ref 5–15)
BUN: 11 mg/dL (ref 6–20)
CO2: 19 mmol/L — ABNORMAL LOW (ref 22–32)
Calcium: 8.4 mg/dL — ABNORMAL LOW (ref 8.9–10.3)
Chloride: 106 mmol/L (ref 98–111)
Creatinine, Ser: 0.69 mg/dL (ref 0.61–1.24)
GFR calc Af Amer: >60 mL/min (ref >60)
GFR calc non Af Amer: >60 mL/min (ref >60)
Glucose, Bld: 98 mg/dL (ref 70–99)
Potassium: 4 mmol/L (ref 3.5–5.1)
Sodium: 137 mmol/L (ref 135–145)
Total Bilirubin: 15.3 mg/dL — ABNORMAL HIGH (ref 0.3–1.2)
Total Protein: 5.5 g/dL — ABNORMAL LOW (ref 6.5–8.1)

## 2019-08-13 LAB — RESPIRATORY PANEL BY RT PCR (FLU A&B, COVID)
Influenza A by PCR: NEGATIVE
Influenza B by PCR: NEGATIVE
SARS Coronavirus 2 by RT PCR: NEGATIVE

## 2019-08-13 LAB — MAGNESIUM: Magnesium: 2 mg/dL (ref 1.7–2.4)

## 2019-08-13 LAB — HEMOGLOBIN: Hemoglobin: 10.3 g/dL — ABNORMAL LOW (ref 13.0–17.0)

## 2019-08-13 LAB — PROTIME-INR
INR: 1.6 — ABNORMAL HIGH (ref 0.8–1.2)
Prothrombin Time: 18 seconds — ABNORMAL HIGH (ref 11.4–15.2)

## 2019-08-13 LAB — PHOSPHORUS: Phosphorus: 4.8 mg/dL — ABNORMAL HIGH (ref 2.5–4.6)

## 2019-08-13 LAB — HEMATOCRIT: HCT: 30.7 % — ABNORMAL LOW (ref 39.0–52.0)

## 2019-08-13 MED ORDER — POLYVINYL ALCOHOL 1.4 % OP SOLN: Freq: Every day | OPHTHALMIC | Status: DC | PRN

## 2019-08-13 MED ORDER — TAMSULOSIN HCL 0.4 MG PO CAPS
0.4000 mg | ORAL_CAPSULE | Freq: Every day | ORAL | Status: DC
  Administered 2019-08-13 – 2019-08-14 (×2): 0.4 mg via ORAL
  Filled 2019-08-13 (×3): qty 1

## 2019-08-13 MED ORDER — FENTANYL CITRATE (PF) 100 MCG/2ML IJ SOLN
50.0000 ug | INTRAMUSCULAR | Status: AC | PRN
  Administered 2019-08-13 (×2): 50 ug via INTRAVENOUS
  Filled 2019-08-13 (×2): qty 2

## 2019-08-13 MED ORDER — OXYCODONE HCL 5 MG PO TABS
5.0000 mg | ORAL_TABLET | ORAL | Status: DC | PRN
  Administered 2019-08-13 – 2019-08-15 (×6): 5 mg via ORAL
  Filled 2019-08-13 (×6): qty 1

## 2019-08-13 MED ORDER — BISACODYL 5 MG PO TBEC
20.0000 mg | DELAYED_RELEASE_TABLET | Freq: Once | ORAL | Status: AC
  Administered 2019-08-13: 20 mg via ORAL
  Filled 2019-08-13: qty 4

## 2019-08-13 MED ORDER — PEG-KCL-NACL-NASULF-NA ASC-C 100 G PO SOLR
0.5000 | Freq: Once | ORAL | Status: AC
  Administered 2019-08-13: 100 g via ORAL
  Filled 2019-08-13 (×2): qty 1

## 2019-08-13 MED ORDER — PEG-KCL-NACL-NASULF-NA ASC-C 100 G PO SOLR: 1.0000 | Freq: Once | ORAL | Status: DC

## 2019-08-13 MED ORDER — METOCLOPRAMIDE HCL 5 MG/ML IJ SOLN
10.0000 mg | Freq: Once | INTRAMUSCULAR | Status: AC
  Administered 2019-08-13: 10 mg via INTRAVENOUS
  Filled 2019-08-13: qty 2

## 2019-08-13 MED ORDER — PREDNISOLONE 5 MG PO TABS
40.0000 mg | ORAL_TABLET | Freq: Every day | ORAL | Status: DC
  Administered 2019-08-13 – 2019-08-15 (×3): 40 mg via ORAL
  Filled 2019-08-13 (×3): qty 8

## 2019-08-13 MED ORDER — METOCLOPRAMIDE HCL 5 MG/ML IJ SOLN
10.0000 mg | Freq: Once | INTRAMUSCULAR | Status: DC
  Filled 2019-08-13: qty 2

## 2019-08-13 MED ORDER — LACTULOSE 10 GM/15ML PO SOLN
10.0000 g | Freq: Two times a day (BID) | ORAL | Status: DC
  Administered 2019-08-13 – 2019-08-15 (×3): 10 g via ORAL
  Filled 2019-08-13 (×4): qty 15

## 2019-08-13 MED ORDER — FOLIC ACID 1 MG PO TABS
1.0000 mg | ORAL_TABLET | Freq: Every day | ORAL | Status: DC
  Administered 2019-08-14 – 2019-08-15 (×2): 1 mg via ORAL
  Filled 2019-08-13 (×2): qty 1

## 2019-08-13 MED ORDER — FENTANYL CITRATE (PF) 100 MCG/2ML IJ SOLN: 50.0000 ug | INTRAMUSCULAR | Status: DC | PRN

## 2019-08-13 MED ORDER — MORPHINE SULFATE (PF) 2 MG/ML IV SOLN
2.0000 mg | INTRAVENOUS | Status: DC | PRN
  Administered 2019-08-13 – 2019-08-15 (×10): 2 mg via INTRAVENOUS
  Filled 2019-08-13 (×10): qty 1

## 2019-08-13 MED ORDER — PEG-KCL-NACL-NASULF-NA ASC-C 100 G PO SOLR
0.5000 | Freq: Once | ORAL | Status: AC
  Administered 2019-08-14: 100 g via ORAL
  Filled 2019-08-13: qty 1

## 2019-08-13 NOTE — Progress Notes (Addendum)
PROGRESS NOTE    Christopher Carrillo  EZM:[id number] DOB: 1970/05/23 DOA: 08/12/2019 PCP: Alycia Rossetti, MD   Brief Narrative: 49 year old male with significant medical history of alcoholic hepatitis, macrocytic anemia, hypertension, psoriasis, EtOH abuse, tobacco use who is coming to the emergency department referred by his PCP the due to persistent rectal bleeding for the past 5 days associated with abdominal pain and distention.   He had a colonoscopy on 07/08/2019 due to what felt to be a colon mass, but subsequently he was diagnosed with intermittent ileocecal intussusception after he had benign enteric mucosa findings.  The patient was admitted from 07/21/2019 to 07/26/2019 and treated for confusion, colitis and rising bilirubin level at [facility].  It he was believed that he may have developed hepatitis induced by ketamine and propofol in the setting of chronic alcohol abuse.  He was subsequently admitted again at [facility] for similar symptoms but now with lower extremity edema and confusion.  His ammonia was slightly elevated and he was started on lactulose.  He was discharged on prednisolone for possible alcohol induced hepatitis.  He was put on prednisolone for alcoholic hepatitis, furosemide, Aldactone and lactulose at discharge with follow-up with GI (Dr. Bryan Lemma).  [facility] [place] [facility]/2019.  ED Course: Initial vital signs were temperature 99.2 F, pulse 97, respiration 14, blood pressure 111/63 mmHg and O2 sat 100%.  The patient was given at 1000 mL bolus in the ER. WBC 24.4, hemoglobin 11.6 g/dL and platelets 505.  CMP shows normal electrolytes and renal function.  Glucose 126 mg/dL.  Total protein is 5.7, albumin 2.8 g/dL.  AST is 95, ALT 59 and alkaline phosphatase 129 units/L.  Total bilirubin was 17.3 mg/dL. CT abdomen/pelvis with contrast showed diffusely decreased hepatic hypoattenuation with heterogeneous enhancement, likely to correlate  with recent diagnosis of hepatitis with fibrosis and asteatosis.  There is small to moderate volume ascites throughout the abdomen which is new and concerning for portal hypertension.  Slight mucosal hyperemia and edematous changes of much of the small bowel as well as throughout the colon.  Mild bladder wall thickening.   Subjective: Examined this morning.  Wife at the bedside.  No new complaints no nausea or vomiting. Last Small BM last night 8 pm- with blood, no clots but has not been eating. No chest pain or shortness of breath or nausea.  HE REPORTE he was having 3-5 rectal bleeding/daily called pcp could not get to see GI so PCP referred to ED.  Assessment & Plan:  Hematochezia/bleeding per rectum: FOBT positive stool, colonoscopy 07/10/2019 showed cecal mass at IC valve, polyp in descending colon. pathology of the mass showed benign mucosa.H&H more or less stable but given ongoing bleeding for several days, will need to stay hospitalized for possible colonoscopy,and for GI consultation, monitor serial H&H.  Protonix IV every 24 hour, diet as per GI. Recent Labs  Lab 08/10/19 1626 08/12/19 1646 08/13/19 0040 08/13/19 0659  HGB 11.4* 11.6* 10.3* 10.6*  HCT 31.4* 34.8* 30.7* 31.6*   Acute hepatitis/jaundice/acute liver failure without hepatic coma/mild coagulopathy/recent hepatic encephalopathy: Recently admitted and discharged.  He is on prednisone and will be continued.  Monitor LFTs, continue diuretics for his ascites.  Continue lactulose.  GI evaluation pending.  Follow-up at liver clinic on 08/26/2019.  Last liver biopsy 08/03/2019.  Metabolic acidosis bicarb slightly low, monitor.  Tobacco use: We will advised tobacco cessation, declined nicotine replacement therapy.  He stated he will quit  on his own.  Essential hypertension: Blood pressure on lower side continue Lasix and Aldactone  Anemia of chronic disease: Hemoglobin is stable.  Monitor.  Leukocytosis: She has been up since  last admission, seems downtrending.  Checked chest x-ray UA-serially unremarkable for infection. Fever is absent. Of note is on prednisone which is likely contributing. Recent Labs  Lab 08/10/19 1626 08/12/19 1646 08/13/19 0659  WBC 20.3* 24.4* 19.8*   DVT prophylaxis:SCD Code Status: FULL  Family Communication: plan of care discussed with patient and his wife at bedside.  Status is: Observation The patient will require care spanning > 2 midnights and should be moved to inpatient because: Ongoing active pain requiring inpatient pain management, Ongoing diagnostic testing needed not appropriate for outpatient work up and Inpatient level of care appropriate due to severity of illness  Dispo: The patient is from: Home              Anticipated d/c is to: Home              Anticipated d/c date is: 2 days              Patient currently is not medically stable to d/c. Nutrition: Diet Order            Diet NPO time specified  Diet effective now              Body mass index is 27.91 kg/m. Pressure Ulcer:  Consultants:GI Procedures:see note Microbiology:see note  Medications: Scheduled Meds: . folic acid  1 mg Oral Daily  . lactulose  10 g Oral BID  . prednisoLONE  40 mg Oral Q breakfast  . tamsulosin  0.4 mg Oral QPC supper   Continuous Infusions:  Antimicrobials: Anti-infectives (From admission, onward)   None       Objective: Vitals: Today's Vitals   08/13/19 0600 08/13/19 0630 08/13/19 0700 08/13/19 0745  BP: 105/66 104/66 107/68   Pulse: 74 68 73 76  Resp: 13 11 14 14   Temp:      TempSrc:      SpO2: 99% 98% 99% 99%  Weight:      Height:      PainSc:       No intake or output data in the 24 hours ending 08/13/19 0927 Filed Weights   08/12/19 1635  Weight: 85.7 kg   Weight change:    Intake/Output from previous day: No intake/output data recorded. Intake/Output this shift: No intake/output data recorded.  Examination:  General exam: AAOx3 ,NAD,  weak appearing. HEENT:Oral mucosa moist, Ear/Nose WNL grossly,dentition normal. Respiratory system: bilaterally clear,no wheezing or crackles,no use of accessory muscle, non tender. Cardiovascular system: S1 & S2 +, regular, No JVD. Gastrointestinal system: Abdomen soft, NT,ND, BS+. Nervous System:Alert, awake, moving extremities and grossly nonfocal Extremities: B/L leg edema +, distal peripheral pulses palpable.  Skin: No rashes. MSK: Normal muscle bulk,tone, power  Data Reviewed: I have personally reviewed following labs and imaging studies CBC: Recent Labs  Lab 08/10/19 1626 08/12/19 1646 08/13/19 0040 08/13/19 0659  WBC 20.3* 24.4*  --  19.8*  NEUTROABS 18,067*  --   --  16.3*  HGB 11.4* 11.6* 10.3* 10.6*  HCT 31.4* 34.8* 30.7* 31.6*  MCV 99.1 107.7*  --  107.5*  PLT 505* 505*  --  017*   Basic Metabolic Panel: Recent Labs  Lab 08/10/19 1626 08/12/19 1646 08/13/19 0659  NA 138 136 137  K 3.8 3.8 4.0  CL 103 103 106  CO2  25 23 19*  GLUCOSE 124* 126* 98  BUN 15 12 11   CREATININE 0.65 0.80 0.69  CALCIUM 9.1 9.0 8.4*  MG  --   --  2.0  PHOS  --   --  4.8*   GFR: Estimated Creatinine Clearance: 123.8 mL/min (by C-G formula based on SCr of 0.69 mg/dL). Liver Function Tests: Recent Labs  Lab 08/10/19 1626 08/12/19 1646 08/13/19 0659  AST 88* 95* 86*  ALT 48* 59* 55*  ALKPHOS  --  129* 111  BILITOT 19.2* 17.3* 15.3*  PROT 5.5* 5.7* 5.5*  ALBUMIN  --  2.8* 2.6*   No results for input(s): LIPASE, AMYLASE in the last 168 hours. No results for input(s): AMMONIA in the last 168 hours. Coagulation Profile: Recent Labs  Lab 08/13/19 0600  INR 1.6*   Cardiac Enzymes: No results for input(s): CKTOTAL, CKMB, CKMBINDEX, TROPONINI in the last 168 hours. BNP (last 3 results) No results for input(s): PROBNP in the last 8760 hours. HbA1C: No results for input(s): HGBA1C in the last 72 hours. CBG: No results for input(s): GLUCAP in the last 168 hours. Lipid  Profile: No results for input(s): CHOL, HDL, LDLCALC, TRIG, CHOLHDL, LDLDIRECT in the last 72 hours. Thyroid Function Tests: No results for input(s): TSH, T4TOTAL, FREET4, T3FREE, THYROIDAB in the last 72 hours. Anemia Panel: No results for input(s): VITAMINB12, FOLATE, FERRITIN, TIBC, IRON, RETICCTPCT in the last 72 hours. Sepsis Labs: No results for input(s): PROCALCITON, LATICACIDVEN in the last 168 hours.  No results found for this or any previous visit (from the past 240 hour(s)).    Radiology Studies: CT ABDOMEN PELVIS W CONTRAST  Result Date: 08/12/2019 CLINICAL DATA:  Five days of rectal bleeding, jaundice, positive Hemoccult EXAM: CT ABDOMEN AND PELVIS WITH CONTRAST TECHNIQUE: Multidetector CT imaging of the abdomen and pelvis was performed using the standard protocol following bolus administration of intravenous contrast. CONTRAST:  151m OMNIPAQUE IOHEXOL 300 MG/ML  SOLN COMPARISON:  Ultrasound 07/31/2019, CT 07/21/2019 FINDINGS: Lower chest: Lung bases are clear. Normal heart size. No pericardial effusion. Coronary artery calcification. Hepatobiliary: Diffusely decreased hepatic hypoattenuation with heterogeneous enhancement, may correlate with recent diagnosis of hepatitis and underlying steatosis. Gallbladder and biliary tree is normal. Pancreas: Unremarkable. No pancreatic ductal dilatation or surrounding inflammatory changes. Spleen: Normal in size without focal abnormality. Adrenals/Urinary Tract: Normal adrenals. Kidneys enhance and excrete symmetrically. No concerning renal mass, urolithiasis or hydronephrosis. Mild bladder wall thickening. Stomach/Bowel: Distal esophagus stomach and duodenum free of significant abnormality. Slight mucosal hyperemia and edematous changes of much of the small bowel as well as throughout the colon is similar to slightly more pronounced than on comparison exam on a background of intramural fat within the ascending colon. Scattered colonic diverticula  without focal pericolonic inflammation to suggest diverticulitis. Normal appearance of the appendix. Vascular/Lymphatic: The aorta is normal caliber. No suspicious or enlarged lymph nodes in the included lymphatic chains. Reproductive: The prostate and seminal vesicles are unremarkable. Other: Small to moderate volume ascites throughout the abdomen. This is new from comparison exam and may reflect some developing portal hypertension in the setting of hepatopathy. No abdominopelvic free air. Musculoskeletal: No acute osseous abnormality or suspicious osseous lesions. IMPRESSION: 1. Diffusely decreased hepatic hypoattenuation with heterogeneous enhancement, likely to correlate with recent diagnosis of hepatitis with fibrosis and steatosis. 2. Small to moderate volume ascites throughout the abdomen is new from comparison exam and is concerning for portal hypertension in the setting of hepatopathy. 3. Slight mucosal hyperemia and edematous changes of  much of the small bowel as well as throughout the colon is similar to slightly more pronounced than on comparison exam. Somewhat more nonspecific in the setting of ascites and liver disease though certainly an enterocolitis could have this appearance. 4. Mild bladder wall thickening. Recommend correlation with urinalysis to exclude cystitis. Electronically Signed   By: Lovena Le M.D.   On: 08/12/2019 22:56     LOS: 0 days   Time spent: More than 50% of that time was spent in counseling and/or coordination of care.  Antonieta Pert, MD [facility]  08/13/2019, 9:27 AM

## 2019-08-13 NOTE — ED Notes (Signed)
Lunch Tray Ordered @ 2548.

## 2019-08-13 NOTE — Telephone Encounter (Signed)
Will wait on Discharge from  [facility] to schedule a follow up at the [facility] office.

## 2019-08-13 NOTE — Telephone Encounter (Signed)
Ok to schedule appt in the [facility] given patient preference and location. Looks like he was admitted to the hospital overnight for rectal bleeding. When anticipating d/c, can schedule f/u appt with me. He also has an appt later this month with [facility], and can keep that too for newly diagnosed liver disease.

## 2019-08-13 NOTE — H&P (View-Only) (Signed)
[facility] Consult: 8:20 AM 08/13/2019  LOS: 0 days    Referring Provider: Antonieta Pert  Primary Care Physician:  Alycia Rossetti, MD Primary Gastroenterologist:  Eliezer Bottom,      Reason for Consultation:  Rectal bleeding   HPI: Christopher Carrillo is a 49 y.o. male.  Pt w hx ETOH abuse.  Htn.  Hyperlipidemia.  Psoriasis.  Anxiety, depression.  Polycythemia.  Thrombocytopenia, platelets 123 05/2019.  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  FOBT +.  Referred to GI, underwent 07/20/1919 colonoscopy: mass at IC valve and descending colon polyp. Mass path:benign mucosa, polyp: TA without HGD. Within 24 hours of colonoscopy his urine was dark, right upper quadrant pain persistent, malaise/fatigue. Admission at [facility] 4/13 -07/26/2019 with abdominal pain, alcoholic vs DILI, jaundice.  Meld 24.  Fatty liver,  right colon edema on CT  T bili 12.5.  Alk phos 261.  AST/ALT 107/38.  INR 1.2.  Ferritin 2953.  ANA negative.  Mitochondrial antibody IgG negative.  Hepatitis serologies nonreactive.  Normal platelets.  No APAP level obtained but patient had not been using Tylenol. 07/21/2019 abdominal ultrasound showed fatty liver, hepatomegaly. 07/21/2019 CT abdomen pelvis showed severe fatty liver, normal bile ducts.  Right colon edema.  Previous cecal changes possibly representing intussusception had resolved. Dr. Oneida Alar made [facility], appt there later this month.  48 # wt loss since 12/2018.     4/22 - 4/29 [facility] admission for ongoing RUQ pain, jaundice, fatigue, LE edema, urinary retention.   08/03/2019 liver biopsy.  Path w moderately active steatohepatitis, grade 2 of 3.  Advanced fibrosis, at least  stage 3 of 4, some fibrosis appears mature but many areas show reticular fibrosis which may revert.  Diffuse hepatocellular and canalicular cholestasis.  Fibrous septa and portal tracts with mild lymphocytic inflammation.  No abnormal iron accumulation.  Presence of well-formed Mallory bodies suggest alcoholic steatohepatitis but histopathology cannot differentiate from [place] versus NASH conclusively.  No definitive cirrhosis. Rising disc fx score (<32 at admit, rising to 47), Prednisolone added 4/26, discharged with plan to taper after 28 days.  Received 6 d IV albumin.  HE treated with lactulose, zinc.  INR 1.7 - 1.8 Anasarca, small ascites per [place], treated w Lasix 20/aldactone 50  Since discharge patient having ongoing right upper quadrant pain but not taking any meds for this.  Malaise persists but improved.  Feels weak and gets tired and somewhat dyspneic with outdoor activity such as walking may be 1/10 of a mile.  Appetite is good, regular bowel movements.    For about 5 days passing blood with and without bowel movements.  At first it was intermittent but it has increased anywhere from 3-5 times daily.  No new weakness or dizziness.  There is pain in his lower abdomen, different from the right upper quadrant pain he has had for some time.  Appetite preserved, no nausea/vomiting.  Lower extremity edema improves when  he keeps his legs elevated or after sleeping all night long but reappears during the daytime.  T bili better, transaminases fluctuating, slightly higher if anything.  INR 1.6.  Hgb stable ~ 10.5 (as low as 9.7 twelve days ago).  Rising MCV to 107.  Platelets ~ 500.    08/12/19 CTAP w contrast: Diffuse decrease of hepatic hypoattenuation with heterogeneous enhancement likely correlating with recent diagnosis of hepatitis, fibrosis, steatosis. Small to moderate ascites throughout abdomen, new compared with CT last month. Slight mucosal hyperemia and edema throughout small bowel and colon  slightly more pronounced compared with earlier CT, nonspecific. Mild urinary bladder wall thickening.  Last ETOH was just before colonoscopy, early 07/2019.  Prior to that "a few beers" and shots of liquor on a daily basis.  Pt is a Software engineer at [employer].  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  Smokes a pack of cigarettes over 3 days.  Family history negative for liver disease, alcoholism, colorectal disease.   Past Medical History:  Diagnosis Date   Abdominal pain    Alcoholic hepatitis without ascites    Anemia    Elevated bilirubin    Essential hypertension    Psoriasis    Transaminitis     Past Surgical History:  Procedure Laterality Date   BIOPSY  07/20/2019   Procedure: BIOPSY;  Surgeon: Daneil Dolin, MD;  Location: [facility];  Service: Endoscopy;;   COLONOSCOPY WITH PROPOFOL N/A 07/20/2019   Procedure: COLONOSCOPY WITH PROPOFOL;  Surgeon: Daneil Dolin, MD;  Location: [facility];  Service: Endoscopy;  Laterality: N/A;  10:15am   NO PAST SURGERIES     POLYPECTOMY  07/20/2019   Procedure: POLYPECTOMY;  Surgeon: Daneil Dolin, MD;  Location: [facility];  Service: Endoscopy;;    Prior to Admission medications   Medication Sig Start Date End Date Taking? Authorizing Provider  folic acid (FOLVITE) 1 MG tablet Take 1 tablet (1 mg total) by mouth daily. 07/27/19  Yes Johnson, Clanford L, MD  furosemide (LASIX) 20 MG tablet Take 1 tablet (20 mg total) by mouth daily. 08/07/19 09/06/19 Yes Pahwani, Einar Grad, MD  Hyprom-Naphaz-Polysorb-Zn Sulf (CLEAR EYES COMPLETE OP) Place 1 drop into both eyes daily as needed (for dry eyes).   Yes [provider]  lactulose (CHRONULAC) 10 GM/15ML solution Take 15 mLs (10 g total) by mouth 2 (two) times daily. Reduce or stop taking if you have more than 3 bowel movements a day 08/06/19 09/05/19 Yes Pahwani, Einar Grad, MD  Multiple Vitamin (MULTIVITAMIN WITH MINERALS) TABS  tablet Take 1 tablet by mouth daily. 07/27/19  Yes Johnson, Clanford L, MD  Multiple Vitamin (MULTIVITAMIN WITH MINERALS) TABS tablet Take 1 tablet by mouth daily.   Yes [provider]  oxycodone (OXY-IR) 5 MG capsule Take 5 mg by mouth every 4 (four) hours as needed.   Yes [provider]  prednisoLONE 5 MG TABS tablet Take 8 tablets (40 mg total) by mouth daily for 25 days. 08/07/19 09/01/19 Yes Pahwani, Einar Grad, MD  spironolactone (ALDACTONE) 50 MG tablet Take 1 tablet (50 mg total) by mouth daily. 08/07/19 09/06/19 Yes Pahwani, Einar Grad, MD  tamsulosin (FLOMAX) 0.4 MG CAPS capsule Take 1 capsule (0.4 mg total) by mouth daily after supper. 07/26/19  Yes Johnson, Clanford L, MD  polyethylene glycol (MIRALAX / GLYCOLAX) 17 g packet Take 17 g by mouth daily as needed for  mild constipation. Patient not taking: Reported on 08/10/2019 07/26/19   Murlean Iba, MD    Scheduled Meds:  folic acid  1 mg Oral Daily   lactulose  10 g Oral BID   prednisoLONE  40 mg Oral Q breakfast   tamsulosin  0.4 mg Oral QPC supper   Infusions:  PRN Meds: morphine injection, ondansetron **OR** ondansetron (ZOFRAN) IV, oxyCODONE, polyvinyl alcohol   Allergies as of 08/12/2019 - Review Complete 08/12/2019  Allergen Reaction Noted   Other Other (See Comments) 08/12/2019    Family History  Problem Relation Age of Onset   Asthma Father    Cancer Sister 64       Pharyngeal   Colon cancer Neg Hx    Liver disease Neg Hx     Social History   Socioeconomic History   Marital status: Married    Spouse name: Not on file   Number of children: Not on file   Years of education: Not on file   Highest education level: Not on file  Occupational History   Not on file  Tobacco Use   Smoking status: Current Every Day Smoker    Packs/day: 0.33    Types: Cigarettes   Smokeless tobacco: Never Used  Substance and Sexual Activity   Alcohol use: Not Currently    Comment: 3-4 beers/night     Drug use: No   Sexual activity: Yes  Other Topics Concern   Not on file  Social History Narrative   Not on file   Social Determinants of Health   Financial Resource Strain:    Difficulty of Paying Living Expenses:   Food Insecurity:    Worried About Charity fundraiser in the Last Year:    Arboriculturist in the Last Year:   Transportation Needs:    Film/video editor (Medical):    Lack of Transportation (Non-Medical):   Physical Activity:    Days of Exercise per Week:    Minutes of Exercise per Session:   Stress:    Feeling of Stress :   Social Connections:    Frequency of Communication with Friends and Family:    Frequency of Social Gatherings with Friends and Family:    Attends Religious Services:    Active Member of Clubs or Organizations:    Attends Music therapist:    Marital Status:   Intimate Partner Violence:    Fear of Current or Ex-Partner:    Emotionally Abused:    Physically Abused:    Sexually Abused:     REVIEW OF SYSTEMS: Constitutional: See HPI. ENT:  No nose bleeds Pulm: No cough, some mild DOE as per HPI. CV:  No palpitations, no angina.  Edema per HPI. GU: Urine still somewhat dark.  No difficulty voiding. GI: See HPI. Heme: No unusual or excessive bleeding or bruising. Transfusions: None. Neuro:  No headaches, no peripheral tingling or numbness.  No dizziness, syncope, seizures. Derm:  No itching, no rash or sores.  Endocrine:  No sweats or chills.  No polyuria or dysuria Immunization: Had the first of 2 Covid vaccinations prior to recent admission and was supposed to have the second last month but advised by MD to hold off on the second vaccination due to his hepatitis. Travel:  None beyond local counties in last few months.    PHYSICAL EXAM: Vital signs in last 24 hours: Vitals:   08/13/19 0700 08/13/19 0745  BP: 107/68   Pulse: 73 76  Resp: 14 14  Temp:    SpO2: 99% 99%   Wt Readings from  Last 3 Encounters:  08/12/19 85.7 kg  08/10/19 85.7 kg  07/29/19 88.5 kg    General: Patient still looks jaundiced and icteric.  Overall however he is more alert and looks better than he did during recent hospitalization Head: No facial asymmetry or swelling.  No signs of head trauma. Eyes: Sclera are icteric.  No conjunctival pallor. Ears: Not hard of hearing Nose: No discharge or congestion Mouth: Tongue midline, mucosa moist, pink, clear. Neck: No JVD, no masses, no thyromegaly. Lungs: Clear bilaterally.  No cough or labored breathing Heart: RRR.  No MRG.  S1, S2 present Abdomen: Soft, slight tenderness in the right upper quadrant and in the lower abdomen, no guarding or rebound.  Caveat is he just received morphine within the last hour.  No HSM, masses, bruits, hernias.   Rectal: No visible or palpable lesions or hemorrhoids.  Exam glove clean, no blood or stool. Musc/Skeltl: No joint redness, swelling, gross deformity. Extremities: 2+ pitting and lower leg edema. Neurologic: Alert.  Oriented x3.  No tremors or asterixis.  Moves all 4 limbs with grossly normal strength but strength not formally assessed. Skin: Jaundiced Nodes: No cervical adenopathy Psych: Cooperative, pleasant, calm.  Intake/Output from previous day: No intake/output data recorded. Intake/Output this shift: No intake/output data recorded.  LAB RESULTS: Recent Labs    08/10/19 1626 08/10/19 1626 08/12/19 1646 08/13/19 0040 08/13/19 0659  WBC 20.3*  --  24.4*  --  19.8*  HGB 11.4*   < > 11.6* 10.3* 10.6*  HCT 31.4*   < > 34.8* 30.7* 31.6*  PLT 505*  --  505*  --  452*   < > = values in this interval not displayed.   BMET Lab Results  Component Value Date   NA 137 08/13/2019   NA 136 08/12/2019   NA 138 08/10/2019   K 4.0 08/13/2019   K 3.8 08/12/2019   K 3.8 08/10/2019   CL 106 08/13/2019   CL 103 08/12/2019   CL 103 08/10/2019   CO2 19 (L) 08/13/2019   CO2 23 08/12/2019   CO2 25 08/10/2019    GLUCOSE 98 08/13/2019   GLUCOSE 126 (H) 08/12/2019   GLUCOSE 124 (H) 08/10/2019   BUN 11 08/13/2019   BUN 12 08/12/2019   BUN 15 08/10/2019   CREATININE 0.69 08/13/2019   CREATININE 0.80 08/12/2019   CREATININE 0.65 08/10/2019   CALCIUM 8.4 (L) 08/13/2019   CALCIUM 9.0 08/12/2019   CALCIUM 9.1 08/10/2019   LFT Recent Labs    08/10/19 1626 08/12/19 1646 08/13/19 0659  PROT 5.5* 5.7* 5.5*  ALBUMIN  --  2.8* 2.6*  AST 88* 95* 86*  ALT 48* 59* 55*  ALKPHOS  --  129* 111  BILITOT 19.2* 17.3* 15.3*   PT/INR Lab Results  Component Value Date   INR 1.6 (H) 08/13/2019   INR 1.8 (H) 08/06/2019   INR 1.7 (H) 08/05/2019   Hepatitis Panel No results for input(s): HEPBSAG, HCVAB, HEPAIGM, HEPBIGM in the last 72 hours. C-Diff No components found for: CDIFF Lipase     Component Value Date/Time   LIPASE 72 (H) 07/29/2019 1409    Drugs of Abuse     Component Value Date/Time   LABOPIA NONE DETECTED 07/08/2019 1805   COCAINSCRNUR NONE DETECTED 07/08/2019 1805   LABBENZ NONE DETECTED 07/08/2019 1805   AMPHETMU NONE DETECTED 07/08/2019 1805   THCU NONE  DETECTED 07/08/2019 1805   LABBARB NONE DETECTED 07/08/2019 1805     RADIOLOGY STUDIES: CT ABDOMEN PELVIS W CONTRAST  Result Date: 08/12/2019 CLINICAL DATA:  Five days of rectal bleeding, jaundice, positive Hemoccult EXAM: CT ABDOMEN AND PELVIS WITH CONTRAST TECHNIQUE: Multidetector CT imaging of the abdomen and pelvis was performed using the standard protocol following bolus administration of intravenous contrast. CONTRAST:  1107m OMNIPAQUE IOHEXOL 300 MG/ML  SOLN COMPARISON:  Ultrasound 07/31/2019, CT 07/21/2019 FINDINGS: Lower chest: Lung bases are clear. Normal heart size. No pericardial effusion. Coronary artery calcification. Hepatobiliary: Diffusely decreased hepatic hypoattenuation with heterogeneous enhancement, may correlate with recent diagnosis of hepatitis and underlying steatosis. Gallbladder and biliary tree is  normal. Pancreas: Unremarkable. No pancreatic ductal dilatation or surrounding inflammatory changes. Spleen: Normal in size without focal abnormality. Adrenals/Urinary Tract: Normal adrenals. Kidneys enhance and excrete symmetrically. No concerning renal mass, urolithiasis or hydronephrosis. Mild bladder wall thickening. Stomach/Bowel: Distal esophagus stomach and duodenum free of significant abnormality. Slight mucosal hyperemia and edematous changes of much of the small bowel as well as throughout the colon is similar to slightly more pronounced than on comparison exam on a background of intramural fat within the ascending colon. Scattered colonic diverticula without focal pericolonic inflammation to suggest diverticulitis. Normal appearance of the appendix. Vascular/Lymphatic: The aorta is normal caliber. No suspicious or enlarged lymph nodes in the included lymphatic chains. Reproductive: The prostate and seminal vesicles are unremarkable. Other: Small to moderate volume ascites throughout the abdomen. This is new from comparison exam and may reflect some developing portal hypertension in the setting of hepatopathy. No abdominopelvic free air. Musculoskeletal: No acute osseous abnormality or suspicious osseous lesions. IMPRESSION: 1. Diffusely decreased hepatic hypoattenuation with heterogeneous enhancement, likely to correlate with recent diagnosis of hepatitis with fibrosis and steatosis. 2. Small to moderate volume ascites throughout the abdomen is new from comparison exam and is concerning for portal hypertension in the setting of hepatopathy. 3. Slight mucosal hyperemia and edematous changes of much of the small bowel as well as throughout the colon is similar to slightly more pronounced than on comparison exam. Somewhat more nonspecific in the setting of ascites and liver disease though certainly an enterocolitis could have this appearance. 4. Mild bladder wall thickening. Recommend correlation with  urinalysis to exclude cystitis. Electronically Signed   By: PLovena LeM.D.   On: 08/12/2019 22:56     IMPRESSION:   *   Rectal bleeding 07/20/2019 colonoscopy for eval cecal mass and FOBT + revealed mass at IC valve, polyp in descending colon. Pathology of the mass showed benign mucosa, the polyp was tubular adenoma without HGD. ? Bleeding from the IC valve mass which was only biopsied, not removed at colonoscopy. However cecal mass not mentioned in yesterday's CT report.    *   Macrocytic anemia. Relatively stable Hgb. No evidence of iron, B12 or folate deficiencies on labs in mid April 2021.  *    Leukocytosis, No fever.  ? due to prednisolone.  Has not been chest x-ray, urine not collected for study.  Patient not complaining of sweats, chills, rigors  *   Jaundice, hepatitis. Based on 08/03/2019 liver biopsy and multiple serologic studies, felt to represent alcoholic hepatitis. With rising discriminant function score, prednisolone initiated 4/26. Pending follow-up appointment with [facility] later this month. Last EtOH was early April 2021  *    Abdominal ascites.  *     Hepatic encephalopathy. Lactulose initiated during recent common hospital admission.  *  Mild coagulopathy.    PLAN:     *  Per Dr Tarri Glenn: repeat colonoscopy Okay for clear liquids.  Prep tonite.   Azucena Freed  08/13/2019, 8:20 AM Phone [phone]

## 2019-08-13 NOTE — Consult Note (Addendum)
[facility] Consult: 8:20 AM 08/13/2019  LOS: 0 days    Referring Provider: Antonieta Pert  Primary Care Physician:  Alycia Rossetti, MD Primary Gastroenterologist:  Eliezer Bottom,      Reason for Consultation:  Rectal bleeding   HPI: Jaziel Bennett is a 49 y.o. male.  Pt w hx ETOH abuse.  Htn.  Hyperlipidemia.  Psoriasis.  Anxiety, depression.  Polycythemia.  Thrombocytopenia, platelets 123 05/2019.  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  FOBT +.  Referred to GI, underwent 07/20/1919 colonoscopy: mass at IC valve and descending colon polyp. Mass path:benign mucosa, polyp: TA without HGD. Within 24 hours of colonoscopy his urine was dark, right upper quadrant pain persistent, malaise/fatigue. Admission at [facility] 4/13 -07/26/2019 with abdominal pain, alcoholic vs DILI, jaundice.  Meld 24.  Fatty liver,  right colon edema on CT  T bili 12.5.  Alk phos 261.  AST/ALT 107/38.  INR 1.2.  Ferritin 2953.  ANA negative.  Mitochondrial antibody IgG negative.  Hepatitis serologies nonreactive.  Normal platelets.  No APAP level obtained but patient had not been using Tylenol. 07/21/2019 abdominal ultrasound showed fatty liver, hepatomegaly. 07/21/2019 CT abdomen pelvis showed severe fatty liver, normal bile ducts.  Right colon edema.  Previous cecal changes possibly representing intussusception had resolved. Dr. Oneida Alar [facility], appt there later this month.  48 # wt loss since 12/2018.     4/22 - 4/29 [facility] admission for ongoing RUQ pain, jaundice, fatigue, LE edema, urinary retention.   08/03/2019 liver biopsy.  Path w moderately active steatohepatitis, grade 2 of 3.  Advanced fibrosis, at least  stage 3 of 4, some fibrosis appears mature but many areas show reticular fibrosis which may revert.  Diffuse hepatocellular and canalicular cholestasis.  Fibrous septa and portal tracts with mild lymphocytic inflammation.  No abnormal iron accumulation.  Presence of well-formed Mallory bodies suggest alcoholic steatohepatitis but histopathology cannot differentiate from [place] versus NASH conclusively.  No definitive cirrhosis. Rising disc fx score (<32 at admit, rising to 47), Prednisolone added 4/26, discharged with plan to taper after 28 days.  Received 6 d IV albumin.  HE treated with lactulose, zinc.  INR 1.7 - 1.8 Anasarca, small ascites per [place], treated w Lasix 20/aldactone 50  Since discharge patient having ongoing right upper quadrant pain but not taking any meds for this.  Malaise persists but improved.  Feels weak and gets tired and somewhat dyspneic with outdoor activity such as walking may be 1/10 of a mile.  Appetite is good, regular bowel movements.    For about 5 days passing blood with and without bowel movements.  At first it was intermittent but it has increased anywhere from 3-5 times daily.  No new weakness or dizziness.  There is pain in his lower abdomen, different from the right upper quadrant pain he has had for some time.  Appetite preserved, no nausea/vomiting.  Lower extremity edema improves when  he keeps his legs elevated or after sleeping all night long but reappears during the daytime.  T bili better, transaminases fluctuating, slightly higher if anything.  INR 1.6.  Hgb stable ~ 10.5 (as low as 9.7 twelve days ago).  Rising MCV to 107.  Platelets ~ 500.    08/12/19 CTAP w contrast: Diffuse decrease of hepatic hypoattenuation with heterogeneous enhancement likely correlating with recent diagnosis of hepatitis, fibrosis, steatosis. Small to moderate ascites throughout abdomen, new compared with CT last month. Slight mucosal hyperemia and edema throughout small bowel and colon  slightly more pronounced compared with earlier CT, nonspecific. Mild urinary bladder wall thickening.  Last ETOH was just before colonoscopy, early 07/2019.  Prior to that "a few beers" and shots of liquor on a daily basis.  Pt is a Software engineer at [employer].  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  Smokes a pack of cigarettes over 3 days.  Family history negative for liver disease, alcoholism, colorectal disease.   Past Medical History:  Diagnosis Date   Abdominal pain    Alcoholic hepatitis without ascites    Anemia    Elevated bilirubin    Essential hypertension    Psoriasis    Transaminitis     Past Surgical History:  Procedure Laterality Date   BIOPSY  07/20/2019   Procedure: BIOPSY;  Surgeon: Daneil Dolin, MD;  Location: [facility];  Service: Endoscopy;;   COLONOSCOPY WITH PROPOFOL N/A 07/20/2019   Procedure: COLONOSCOPY WITH PROPOFOL;  Surgeon: Daneil Dolin, MD;  Location: [facility];  Service: Endoscopy;  Laterality: N/A;  10:15am   NO PAST SURGERIES     POLYPECTOMY  07/20/2019   Procedure: POLYPECTOMY;  Surgeon: Daneil Dolin, MD;  Location: [facility];  Service: Endoscopy;;    Prior to Admission medications   Medication Sig Start Date End Date Taking? Authorizing Provider  folic acid (FOLVITE) 1 MG tablet Take 1 tablet (1 mg total) by mouth daily. 07/27/19  Yes Johnson, Clanford L, MD  furosemide (LASIX) 20 MG tablet Take 1 tablet (20 mg total) by mouth daily. 08/07/19 09/06/19 Yes Pahwani, Einar Grad, MD  Hyprom-Naphaz-Polysorb-Zn Sulf (CLEAR EYES COMPLETE OP) Place 1 drop into both eyes daily as needed (for dry eyes).   Yes [provider]  lactulose (CHRONULAC) 10 GM/15ML solution Take 15 mLs (10 g total) by mouth 2 (two) times daily. Reduce or stop taking if you have more than 3 bowel movements a day 08/06/19 09/05/19 Yes Pahwani, Einar Grad, MD  Multiple Vitamin (MULTIVITAMIN WITH MINERALS) TABS  tablet Take 1 tablet by mouth daily. 07/27/19  Yes Johnson, Clanford L, MD  Multiple Vitamin (MULTIVITAMIN WITH MINERALS) TABS tablet Take 1 tablet by mouth daily.   Yes [provider]  oxycodone (OXY-IR) 5 MG capsule Take 5 mg by mouth every 4 (four) hours as needed.   Yes [provider]  prednisoLONE 5 MG TABS tablet Take 8 tablets (40 mg total) by mouth daily for 25 days. 08/07/19 09/01/19 Yes Pahwani, Einar Grad, MD  spironolactone (ALDACTONE) 50 MG tablet Take 1 tablet (50 mg total) by mouth daily. 08/07/19 09/06/19 Yes Pahwani, Einar Grad, MD  tamsulosin (FLOMAX) 0.4 MG CAPS capsule Take 1 capsule (0.4 mg total) by mouth daily after supper. 07/26/19  Yes Johnson, Clanford L, MD  polyethylene glycol (MIRALAX / GLYCOLAX) 17 g packet Take 17 g by mouth daily as needed for  mild constipation. Patient not taking: Reported on 08/10/2019 07/26/19   Murlean Iba, MD    Scheduled Meds:  folic acid  1 mg Oral Daily   lactulose  10 g Oral BID   prednisoLONE  40 mg Oral Q breakfast   tamsulosin  0.4 mg Oral QPC supper   Infusions:  PRN Meds: morphine injection, ondansetron **OR** ondansetron (ZOFRAN) IV, oxyCODONE, polyvinyl alcohol   Allergies as of 08/12/2019 - Review Complete 08/12/2019  Allergen Reaction Noted   Other Other (See Comments) 08/12/2019    Family History  Problem Relation Age of Onset   Asthma Father    Cancer Sister 64       Pharyngeal   Colon cancer Neg Hx    Liver disease Neg Hx     Social History   Socioeconomic History   Marital status: Married    Spouse name: Not on file   Number of children: Not on file   Years of education: Not on file   Highest education level: Not on file  Occupational History   Not on file  Tobacco Use   Smoking status: Current Every Day Smoker    Packs/day: 0.33    Types: Cigarettes   Smokeless tobacco: Never Used  Substance and Sexual Activity   Alcohol use: Not Currently    Comment: 3-4 beers/night     Drug use: No   Sexual activity: Yes  Other Topics Concern   Not on file  Social History Narrative   Not on file   Social Determinants of Health   Financial Resource Strain:    Difficulty of Paying Living Expenses:   Food Insecurity:    Worried About Charity fundraiser in the Last Year:    Arboriculturist in the Last Year:   Transportation Needs:    Film/video editor (Medical):    Lack of Transportation (Non-Medical):   Physical Activity:    Days of Exercise per Week:    Minutes of Exercise per Session:   Stress:    Feeling of Stress :   Social Connections:    Frequency of Communication with Friends and Family:    Frequency of Social Gatherings with Friends and Family:    Attends Religious Services:    Active Member of Clubs or Organizations:    Attends Music therapist:    Marital Status:   Intimate Partner Violence:    Fear of Current or Ex-Partner:    Emotionally Abused:    Physically Abused:    Sexually Abused:     REVIEW OF SYSTEMS: Constitutional: See HPI. ENT:  No nose bleeds Pulm: No cough, some mild DOE as per HPI. CV:  No palpitations, no angina.  Edema per HPI. GU: Urine still somewhat dark.  No difficulty voiding. GI: See HPI. Heme: No unusual or excessive bleeding or bruising. Transfusions: None. Neuro:  No headaches, no peripheral tingling or numbness.  No dizziness, syncope, seizures. Derm:  No itching, no rash or sores.  Endocrine:  No sweats or chills.  No polyuria or dysuria Immunization: Had the first of 2 Covid vaccinations prior to recent admission and was supposed to have the second last month but advised by MD to hold off on the second vaccination due to his hepatitis. Travel:  None beyond local counties in last few months.    PHYSICAL EXAM: Vital signs in last 24 hours: Vitals:   08/13/19 0700 08/13/19 0745  BP: 107/68   Pulse: 73 76  Resp: 14 14  °Temp:    °SpO2: 99% 99%  ° °Wt Readings from  Last 3 Encounters:  °08/12/19 85.7 kg  °08/10/19 85.7 kg  °07/29/19 88.5 kg  ° ° °General: Patient still looks jaundiced and icteric.  Overall however he is more alert and looks better than he did during recent hospitalization °Head: No facial asymmetry or swelling.  No signs of head trauma. °Eyes: Sclera are icteric.  No conjunctival pallor. °Ears: Not hard of hearing °Nose: No discharge or congestion °Mouth: Tongue midline, mucosa moist, pink, clear. °Neck: No JVD, no masses, no thyromegaly. °Lungs: Clear bilaterally.  No cough or labored breathing °Heart: RRR.  No MRG.  S1, S2 present °Abdomen: Soft, slight tenderness in the right upper quadrant and in the lower abdomen, no guarding or rebound.  Caveat is he just received morphine within the last hour.  No HSM, masses, bruits, hernias.   °Rectal: No visible or palpable lesions or hemorrhoids.  Exam glove clean, no blood or stool. °Musc/Skeltl: No joint redness, swelling, gross deformity. °Extremities: 2+ pitting and lower leg edema. °Neurologic: Alert.  Oriented x3.  No tremors or asterixis.  Moves all 4 limbs with grossly normal strength but strength not formally assessed. °Skin: Jaundiced °Nodes: No cervical adenopathy °Psych: Cooperative, pleasant, calm. ° °Intake/Output from previous day: °No intake/output data recorded. °Intake/Output this shift: °No intake/output data recorded. ° °LAB RESULTS: °Recent Labs  °  08/10/19 °1626 08/10/19 °1626 08/12/19 °1646 08/13/19 °0040 08/13/19 °0659  °WBC 20.3*  --  24.4*  --  19.8*  °HGB 11.4*   < > 11.6* 10.3* 10.6*  °HCT 31.4*   < > 34.8* 30.7* 31.6*  °PLT 505*  --  505*  --  452*  ° < > = values in this interval not displayed.  ° °BMET °Lab Results  °Component Value Date  ° NA 137 08/13/2019  ° NA 136 08/12/2019  ° NA 138 08/10/2019  ° K 4.0 08/13/2019  ° K 3.8 08/12/2019  ° K 3.8 08/10/2019  ° CL 106 08/13/2019  ° CL 103 08/12/2019  ° CL 103 08/10/2019  ° CO2 19 (L) 08/13/2019  ° CO2 23 08/12/2019  ° CO2 25 08/10/2019   ° GLUCOSE 98 08/13/2019  ° GLUCOSE 126 (H) 08/12/2019  ° GLUCOSE 124 (H) 08/10/2019  ° BUN 11 08/13/2019  ° BUN 12 08/12/2019  ° BUN 15 08/10/2019  ° CREATININE 0.69 08/13/2019  ° CREATININE 0.80 08/12/2019  ° CREATININE 0.65 08/10/2019  ° CALCIUM 8.4 (L) 08/13/2019  ° CALCIUM 9.0 08/12/2019  ° CALCIUM 9.1 08/10/2019  ° °LFT °Recent Labs  °  08/10/19 °1626 08/12/19 °1646 08/13/19 °0659  °PROT 5.5* 5.7* 5.5*  °ALBUMIN  --  2.8* 2.6*  °AST 88* 95* 86*  °ALT 48* 59* 55*  °ALKPHOS  --  129* 111  °BILITOT 19.2* 17.3* 15.3*  ° °PT/INR °Lab Results  °Component Value Date  ° INR 1.6 (H) 08/13/2019  ° INR 1.8 (H) 08/06/2019  ° INR 1.7 (H) 08/05/2019  ° °Hepatitis Panel °No results for input(s): HEPBSAG, HCVAB, HEPAIGM, HEPBIGM in the last 72 hours. °C-Diff °No components found for: CDIFF °Lipase  °   °Component Value Date/Time  ° LIPASE 72 (H) 07/29/2019 1409  ° ° °Drugs of Abuse  °   °Component Value Date/Time  ° LABOPIA NONE DETECTED 07/08/2019 1805  ° COCAINSCRNUR NONE DETECTED 07/08/2019 1805  ° LABBENZ NONE DETECTED 07/08/2019 1805  ° AMPHETMU NONE DETECTED 07/08/2019 1805  ° THCU NONE   DETECTED 07/08/2019 1805   LABBARB NONE DETECTED 07/08/2019 1805     RADIOLOGY STUDIES: CT ABDOMEN PELVIS W CONTRAST  Result Date: 08/12/2019 CLINICAL DATA:  Five days of rectal bleeding, jaundice, positive Hemoccult EXAM: CT ABDOMEN AND PELVIS WITH CONTRAST TECHNIQUE: Multidetector CT imaging of the abdomen and pelvis was performed using the standard protocol following bolus administration of intravenous contrast. CONTRAST:  1107m OMNIPAQUE IOHEXOL 300 MG/ML  SOLN COMPARISON:  Ultrasound 07/31/2019, CT 07/21/2019 FINDINGS: Lower chest: Lung bases are clear. Normal heart size. No pericardial effusion. Coronary artery calcification. Hepatobiliary: Diffusely decreased hepatic hypoattenuation with heterogeneous enhancement, may correlate with recent diagnosis of hepatitis and underlying steatosis. Gallbladder and biliary tree is  normal. Pancreas: Unremarkable. No pancreatic ductal dilatation or surrounding inflammatory changes. Spleen: Normal in size without focal abnormality. Adrenals/Urinary Tract: Normal adrenals. Kidneys enhance and excrete symmetrically. No concerning renal mass, urolithiasis or hydronephrosis. Mild bladder wall thickening. Stomach/Bowel: Distal esophagus stomach and duodenum free of significant abnormality. Slight mucosal hyperemia and edematous changes of much of the small bowel as well as throughout the colon is similar to slightly more pronounced than on comparison exam on a background of intramural fat within the ascending colon. Scattered colonic diverticula without focal pericolonic inflammation to suggest diverticulitis. Normal appearance of the appendix. Vascular/Lymphatic: The aorta is normal caliber. No suspicious or enlarged lymph nodes in the included lymphatic chains. Reproductive: The prostate and seminal vesicles are unremarkable. Other: Small to moderate volume ascites throughout the abdomen. This is new from comparison exam and may reflect some developing portal hypertension in the setting of hepatopathy. No abdominopelvic free air. Musculoskeletal: No acute osseous abnormality or suspicious osseous lesions. IMPRESSION: 1. Diffusely decreased hepatic hypoattenuation with heterogeneous enhancement, likely to correlate with recent diagnosis of hepatitis with fibrosis and steatosis. 2. Small to moderate volume ascites throughout the abdomen is new from comparison exam and is concerning for portal hypertension in the setting of hepatopathy. 3. Slight mucosal hyperemia and edematous changes of much of the small bowel as well as throughout the colon is similar to slightly more pronounced than on comparison exam. Somewhat more nonspecific in the setting of ascites and liver disease though certainly an enterocolitis could have this appearance. 4. Mild bladder wall thickening. Recommend correlation with  urinalysis to exclude cystitis. Electronically Signed   By: PLovena LeM.D.   On: 08/12/2019 22:56     IMPRESSION:   *   Rectal bleeding 07/20/2019 colonoscopy for eval cecal mass and FOBT + revealed mass at IC valve, polyp in descending colon. Pathology of the mass showed benign mucosa, the polyp was tubular adenoma without HGD. ? Bleeding from the IC valve mass which was only biopsied, not removed at colonoscopy. However cecal mass not mentioned in yesterday's CT report.    *   Macrocytic anemia. Relatively stable Hgb. No evidence of iron, B12 or folate deficiencies on labs in mid April 2021.  *    Leukocytosis, No fever.  ? due to prednisolone.  Has not been chest x-ray, urine not collected for study.  Patient not complaining of sweats, chills, rigors  *   Jaundice, hepatitis. Based on 08/03/2019 liver biopsy and multiple serologic studies, felt to represent alcoholic hepatitis. With rising discriminant function score, prednisolone initiated 4/26. Pending follow-up appointment with [facility] later this month. Last EtOH was early April 2021  *    Abdominal ascites.  *     Hepatic encephalopathy. Lactulose initiated during recent common hospital admission.  *  Mild coagulopathy.    PLAN:     *  Per Dr Tarri Glenn: repeat colonoscopy Okay for clear liquids.  Prep tonite.   Azucena Freed  08/13/2019, 8:20 AM Phone [phone]

## 2019-08-13 NOTE — ED Notes (Signed)
Lunch Tray Ordered @ 0123.

## 2019-08-14 ENCOUNTER — Inpatient Hospital Stay (HOSPITAL_COMMUNITY): Payer: 59 | Admitting: Certified Registered Nurse Anesthetist

## 2019-08-14 ENCOUNTER — Encounter (HOSPITAL_COMMUNITY)
Admission: EM | Disposition: A | Discharge status: Home / Self Care | Payer: Self-pay | Source: Home / Self Care | Attending: Internal Medicine

## 2019-08-14 ENCOUNTER — Inpatient Hospital Stay (HOSPITAL_COMMUNITY): Payer: 59

## 2019-08-14 ENCOUNTER — Encounter (HOSPITAL_COMMUNITY): Payer: Self-pay | Admitting: Internal Medicine

## 2019-08-14 DIAGNOSIS — K633 Ulcer of intestine: Secondary | ICD-10-CM

## 2019-08-14 DIAGNOSIS — K635 Polyp of colon: Secondary | ICD-10-CM

## 2019-08-14 HISTORY — PX: COLONOSCOPY WITH PROPOFOL: SHX5780

## 2019-08-14 HISTORY — PX: IR PARACENTESIS: IMG2679

## 2019-08-14 HISTORY — PX: BIOPSY: SHX5522

## 2019-08-14 HISTORY — PX: SUBMUCOSAL TATTOO INJECTION: SHX6856

## 2019-08-14 LAB — COMPREHENSIVE METABOLIC PANEL
ALT: 62 U/L — ABNORMAL HIGH (ref 0–44)
AST: 83 U/L — ABNORMAL HIGH (ref 15–41)
Albumin: 2.7 g/dL — ABNORMAL LOW (ref 3.5–5.0)
Alkaline Phosphatase: 121 U/L (ref 38–126)
Anion gap: 12 (ref 5–15)
BUN: 15 mg/dL (ref 6–20)
CO2: 19 mmol/L — ABNORMAL LOW (ref 22–32)
Calcium: 8.5 mg/dL — ABNORMAL LOW (ref 8.9–10.3)
Chloride: 106 mmol/L (ref 98–111)
Creatinine, Ser: 1.04 mg/dL (ref 0.61–1.24)
GFR calc Af Amer: >60 mL/min (ref >60)
GFR calc non Af Amer: >60 mL/min (ref >60)
Glucose, Bld: 118 mg/dL — ABNORMAL HIGH (ref 70–99)
Potassium: 3.8 mmol/L (ref 3.5–5.1)
Sodium: 137 mmol/L (ref 135–145)
Total Bilirubin: 15.1 mg/dL — ABNORMAL HIGH (ref 0.3–1.2)
Total Protein: 5.5 g/dL — ABNORMAL LOW (ref 6.5–8.1)

## 2019-08-14 LAB — ALBUMIN, PLEURAL OR PERITONEAL FLUID: Albumin, Fluid: 1.4 g/dL

## 2019-08-14 LAB — PROTEIN, PLEURAL OR PERITONEAL FLUID: Total protein, fluid: <3 g/dL

## 2019-08-14 LAB — CBC
HCT: 32.7 % — ABNORMAL LOW (ref 39.0–52.0)
Hemoglobin: 10.8 g/dL — ABNORMAL LOW (ref 13.0–17.0)
MCH: 35.9 pg — ABNORMAL HIGH (ref 26.0–34.0)
MCHC: 33 g/dL (ref 30.0–36.0)
MCV: 108.6 fL — ABNORMAL HIGH (ref 80.0–100.0)
Platelets: 437 10*3/uL — ABNORMAL HIGH (ref 150–400)
RBC: 3.01 MIL/uL — ABNORMAL LOW (ref 4.22–5.81)
RDW: 16.3 % — ABNORMAL HIGH (ref 11.5–15.5)
WBC: 25 10*3/uL — ABNORMAL HIGH (ref 4.0–10.5)
nRBC: 0 % (ref 0.0–0.2)

## 2019-08-14 LAB — BODY FLUID CELL COUNT WITH DIFFERENTIAL
Lymphs, Fluid: 43 %
Monocyte-Macrophage-Serous Fluid: 42 % — ABNORMAL LOW (ref 50–90)
Neutrophil Count, Fluid: 15 % (ref 0–25)
Total Nucleated Cell Count, Fluid: 523 cu mm (ref 0–1000)

## 2019-08-14 LAB — GRAM STAIN

## 2019-08-14 SURGERY — COLONOSCOPY WITH PROPOFOL
Anesthesia: Monitor Anesthesia Care

## 2019-08-14 MED ORDER — HYDROCORTISONE ACETATE 25 MG RE SUPP
25.0000 mg | Freq: Two times a day (BID) | RECTAL | Status: DC
  Administered 2019-08-14 – 2019-08-15 (×3): 25 mg via RECTAL
  Filled 2019-08-14 (×5): qty 1

## 2019-08-14 MED ORDER — ALBUMIN HUMAN 25 % IV SOLN
25.0000 g | Freq: Once | INTRAVENOUS | Status: AC
  Administered 2019-08-14: 25 g via INTRAVENOUS
  Filled 2019-08-14: qty 100

## 2019-08-14 MED ORDER — PROPOFOL 10 MG/ML IV BOLUS
INTRAVENOUS | Status: DC | PRN
  Administered 2019-08-14 (×2): 20 mg via INTRAVENOUS

## 2019-08-14 MED ORDER — PHENYLEPHRINE HCL (PRESSORS) 10 MG/ML IV SOLN
INTRAVENOUS | Status: DC | PRN
Start: 2019-08-14 — End: 2019-08-14
  Administered 2019-08-14 (×6): 80 ug via INTRAVENOUS

## 2019-08-14 MED ORDER — LIDOCAINE HCL 1 % IJ SOLN
INTRAMUSCULAR | Status: AC
  Filled 2019-08-14: qty 20

## 2019-08-14 MED ORDER — METHOCARBAMOL 1000 MG/10ML IJ SOLN
500.0000 mg | Freq: Three times a day (TID) | INTRAVENOUS | Status: DC | PRN
  Administered 2019-08-14: 500 mg via INTRAVENOUS
  Filled 2019-08-14: qty 5
  Filled 2019-08-14: qty 500

## 2019-08-14 MED ORDER — HYDROCORTISONE (PERIANAL) 2.5 % EX CREA: TOPICAL_CREAM | Freq: Two times a day (BID) | CUTANEOUS | Status: DC

## 2019-08-14 MED ORDER — LACTATED RINGERS IV SOLN
INTRAVENOUS | Status: AC | PRN
  Administered 2019-08-14: 1000 mL via INTRAVENOUS

## 2019-08-14 MED ORDER — PROPOFOL 500 MG/50ML IV EMUL
INTRAVENOUS | Status: DC | PRN
  Administered 2019-08-14: 150 ug/kg/min via INTRAVENOUS

## 2019-08-14 MED ORDER — SPOT INK MARKER SYRINGE KIT
PACK | SUBMUCOSAL | Status: AC
  Filled 2019-08-14: qty 5

## 2019-08-14 MED ORDER — SPOT INK MARKER SYRINGE KIT
PACK | SUBMUCOSAL | Status: DC | PRN
  Administered 2019-08-14: 2 mL via SUBMUCOSAL

## 2019-08-14 SURGICAL SUPPLY — 22 items

## 2019-08-14 NOTE — Progress Notes (Signed)
PROGRESS NOTE    Christopher Carrillo  NWG:[id number] DOB: 06/07/1970 DOA: 08/12/2019 PCP: Alycia Rossetti, MD   Brief Narrative: 49 year old male with significant medical history of alcoholic hepatitis, macrocytic anemia, hypertension, psoriasis, EtOH abuse, tobacco use who is coming to the emergency department referred by his PCP the due to persistent rectal bleeding for the past 5 days associated with abdominal pain and distention.   He had a colonoscopy on 07/08/2019 due to what felt to be a colon mass, but subsequently he was diagnosed with intermittent ileocecal intussusception after he had benign enteric mucosa findings.  The patient was admitted from 07/21/2019 to 07/26/2019 and treated for confusion, colitis and rising bilirubin level at [facility].  It he was believed that he may have developed hepatitis induced by ketamine and propofol in the setting of chronic alcohol abuse.  He was subsequently admitted again at [facility] for similar symptoms but now with lower extremity edema and confusion.  His ammonia was slightly elevated and he was started on lactulose.  He was discharged on prednisolone for possible alcohol induced hepatitis.  He was put on prednisolone for alcoholic hepatitis, furosemide, Aldactone and lactulose at discharge with follow-up with GI (Dr. Bryan Lemma).  [facility] [place] [facility]26/2019.  ED Course: Initial vital signs were temperature 99.2 F, pulse 97, respiration 14, blood pressure 111/63 mmHg and O2 sat 100%.  The patient was given at 1000 mL bolus in the ER. WBC 24.4, hemoglobin 11.6 g/dL and platelets 505.  CMP shows normal electrolytes and renal function.  Glucose 126 mg/dL.  Total protein is 5.7, albumin 2.8 g/dL.  AST is 95, ALT 59 and alkaline phosphatase 129 units/L.  Total bilirubin was 17.3 mg/dL. CT abdomen/pelvis with contrast showed diffusely decreased hepatic hypoattenuation with heterogeneous enhancement, likely to correlate  with recent diagnosis of hepatitis with fibrosis and asteatosis.  There is small to moderate volume ascites throughout the abdomen which is new and concerning for portal hypertension.  Slight mucosal hyperemia and edematous changes of much of the small bowel as well as throughout the colon.  Mild bladder wall thickening.   Subjective: Seen this morning.  Reports had multiple episodes of bowel movement with prep.  Going for colonoscopy. No chest pain or shortness of breath or nausea.  He was having 3-5 rectal bleeding/daily called pcp could not get to see GI so PCP referred to ED.  Assessment & Plan:  Hematochezia/bleeding per rectum: FOBT positive stool, colonoscopy 07/10/2019 showed cecal mass at IC valve, polyp in descending colon. pathology of the mass showed benign mucosa.H&H more or less stable but given ongoing bleeding for several days seen by GI and plan for colonoscopy, continue PPI, monitor H&H.  Discussed with GI after colonoscopy likely hemorrhoidal bleeding starting Anusol [facility] Recent Labs  Lab 08/10/19 1626 08/12/19 1646 08/13/19 0040 08/13/19 0659 08/14/19 0715  HGB 11.4* 11.6* 10.3* 10.6* 10.8*  HCT 31.4* 34.8* 30.7* 31.6* 32.7*   Acute hepatitis/jaundice/acute liver failure without hepatic coma/mild coagulopathy/recent hepatic encephalopathy: Recently admitted and discharged.  He is on prednisone and will be continued.  LFTs with a stable AST ALT slightly high (total bili 15). Continue diuretics for his ascites-planning for diagnostic tap after colonoscopy today.Follow-up at liver clinic on 08/26/2019.  Last liver biopsy 08/03/2019.  Metabolic acidosis bicarb slightly low at 19, monitor.  Tobacco VHQ:IONGEXBM nicotine replacement therapy.  He stated he will quit on his own.  Essential hypertension: Blood pressure on lower side-somewhat expected  given his liver disease, he is on Lasix and Aldactone- cont cautiously.  Anemia of chronic disease: Hemoglobin is stable.   Monitor.  Leukocytosis: She has been up since last admission.Checked chest x-ray UA-serially unremarkable for infection. Fever is absent. Of note is on prednisone which is likely contributing. Recent Labs  Lab 08/10/19 1626 08/12/19 1646 08/13/19 0659 08/14/19 0715  WBC 20.3* 24.4* 19.8* 25.0*   DVT prophylaxis:SCD Code Status: FULL  Family Communication: plan of care discussed with patient.  Status is: inpatient The patient will require care spanning > 2 midnights  Due to Ongoing active pain requiring inpatient pain management, Ongoing diagnostic testing needed not appropriate for outpatient work up and Inpatient level of care appropriate due to severity of illness  Dispo: The patient is from: Home              Anticipated d/c is to: Home              Anticipated d/c date is: 1 days              Patient currently is not medically stable to d/c. Nutrition: Diet Order            Diet NPO time specified  Diet effective ____              Body mass index is 27.76 kg/m. Pressure Ulcer:  Consultants:GI Procedures:see note Microbiology:see note  Medications: Scheduled Meds: . folic acid  1 mg Oral Daily  . hydrocortisone  25 mg Rectal BID  . lactulose  10 g Oral BID  . lidocaine      . metoCLOPramide (REGLAN) injection  10 mg Intravenous Once  . prednisoLONE  40 mg Oral Q breakfast  . tamsulosin  0.4 mg Oral QPC supper   Continuous Infusions: . albumin human      Antimicrobials: Anti-infectives (From admission, onward)   None       Objective: Vitals: Today's Vitals   08/14/19 1120 08/14/19 1125 08/14/19 1135 08/14/19 1220  BP: (!) 93/59 (!) 94/50 (!) 100/54 96/64  Pulse: 83 79 84 86  Resp: 10 10 16 16   Temp:    98 F (36.7 C)  TempSrc:    Oral  SpO2: 100% 100% 100% 100%  Weight:      Height:      PainSc:  0-No pain 0-No pain 8     Intake/Output Summary (Last 24 hours) at 08/14/2019 1306 Last data filed at 08/14/2019 1112 Gross per 24 hour  Intake  740 ml  Output 0 ml  Net 740 ml   Filed Weights   08/12/19 1635 08/14/19 1006  Weight: 85.7 kg 85.3 kg   Weight change:    Intake/Output from previous day: 05/06 0701 - 05/07 0700 In: 240 [P.O.:240] Out: -  Intake/Output this shift: Total I/O In: 500 [I.V.:500] Out: 0   Examination:  General exam: AAOx3 ,NAD, weak appearing. HEENT:Oral mucosa moist, Ear/Nose WNL grossly,dentition normal. Respiratory system: bilaterally clear,no wheezing or crackles,no use of accessory muscle, non tender. Cardiovascular system: S1 & S2 +, regular, No JVD. Gastrointestinal system: Abdomen soft, mildly distended,BS+. Nervous System:Alert, awake, moving extremities and grossly nonfocal Extremities: B/L leg edema +, distal peripheral pulses palpable.  Skin: No rashes. MSK: Normal muscle bulk,tone, power  Data Reviewed: I have personally reviewed following labs and imaging studies CBC: Recent Labs  Lab 08/10/19 1626 08/12/19 1646 08/13/19 0040 08/13/19 0659 08/14/19 0715  WBC 20.3* 24.4*  --  19.8* 25.0*  NEUTROABS 18,067*  --   --  16.3*  --   HGB 11.4* 11.6* 10.3* 10.6* 10.8*  HCT 31.4* 34.8* 30.7* 31.6* 32.7*  MCV 99.1 107.7*  --  107.5* 108.6*  PLT 505* 505*  --  452* 235*   Basic Metabolic Panel: Recent Labs  Lab 08/10/19 1626 08/12/19 1646 08/13/19 0659 08/14/19 0715  NA 138 136 137 137  K 3.8 3.8 4.0 3.8  CL 103 103 106 106  CO2 25 23 19* 19*  GLUCOSE 124* 126* 98 118*  BUN 15 12 11 15   CREATININE 0.65 0.80 0.69 1.04  CALCIUM 9.1 9.0 8.4* 8.5*  MG  --   --  2.0  --   PHOS  --   --  4.8*  --    GFR: Estimated Creatinine Clearance: 95 mL/min (by C-G formula based on SCr of 1.04 mg/dL). Liver Function Tests: Recent Labs  Lab 08/10/19 1626 08/12/19 1646 08/13/19 0659 08/14/19 0715  AST 88* 95* 86* 83*  ALT 48* 59* 55* 62*  ALKPHOS  --  129* 111 121  BILITOT 19.2* 17.3* 15.3* 15.1*  PROT 5.5* 5.7* 5.5* 5.5*  ALBUMIN  --  2.8* 2.6* 2.7*   No results for  input(s): LIPASE, AMYLASE in the last 168 hours. No results for input(s): AMMONIA in the last 168 hours. Coagulation Profile: Recent Labs  Lab 08/13/19 0600  INR 1.6*   Cardiac Enzymes: No results for input(s): CKTOTAL, CKMB, CKMBINDEX, TROPONINI in the last 168 hours. BNP (last 3 results) No results for input(s): PROBNP in the last 8760 hours. HbA1C: No results for input(s): HGBA1C in the last 72 hours. CBG: No results for input(s): GLUCAP in the last 168 hours. Lipid Profile: No results for input(s): CHOL, HDL, LDLCALC, TRIG, CHOLHDL, LDLDIRECT in the last 72 hours. Thyroid Function Tests: No results for input(s): TSH, T4TOTAL, FREET4, T3FREE, THYROIDAB in the last 72 hours. Anemia Panel: No results for input(s): VITAMINB12, FOLATE, FERRITIN, TIBC, IRON, RETICCTPCT in the last 72 hours. Sepsis Labs: No results for input(s): PROCALCITON, LATICACIDVEN in the last 168 hours.  Recent Results (from the past 240 hour(s))  Respiratory Panel by RT PCR (Flu A&B, Covid) - Nasopharyngeal Swab     Status: None   Collection Time: 08/13/19  9:39 AM   Specimen: Nasopharyngeal Swab  Result Value Ref Range Status   SARS Coronavirus 2 by RT PCR NEGATIVE NEGATIVE Final    Comment: (NOTE) SARS-CoV-2 target nucleic acids are NOT DETECTED. The SARS-CoV-2 RNA is generally detectable in upper respiratoy specimens during the acute phase of infection. The lowest concentration of SARS-CoV-2 viral copies this assay can detect is 131 copies/mL. A negative result does not preclude SARS-Cov-2 infection and should not be used as the sole basis for treatment or other patient management decisions. A negative result may occur with  improper specimen collection/handling, submission of specimen other than nasopharyngeal swab, presence of viral mutation(s) within the areas targeted by this assay, and inadequate number of viral copies (<131 copies/mL). A negative result must be combined with  clinical observations, patient history, and epidemiological information. The expected result is Negative. Fact Sheet for Patients:  PinkCheek.be Fact Sheet for Healthcare Providers:  GravelBags.it This test is not yet ap proved or cleared by the [place] FDA and  has been authorized for detection and/or diagnosis of SARS-CoV-2 by FDA under an Emergency Use Authorization (EUA). This EUA will remain  in effect (meaning this test can be used) for the duration of the COVID-19 declaration under Section 564(b)(1) of the Act, 21  U.S.C. section 360bbb-3(b)(1), unless the authorization is terminated or revoked sooner.    Influenza A by PCR NEGATIVE NEGATIVE Final   Influenza B by PCR NEGATIVE NEGATIVE Final    Comment: (NOTE) The Xpert Xpress SARS-CoV-2/FLU/RSV assay is intended as an aid in  the diagnosis of influenza from Nasopharyngeal swab specimens and  should not be used as a sole basis for treatment. Nasal washings and  aspirates are unacceptable for Xpert Xpress SARS-CoV-2/FLU/RSV  testing. Fact Sheet for Patients: PinkCheek.be Fact Sheet for Healthcare Providers: GravelBags.it This test is not yet approved or cleared by the [place] FDA and  has been authorized for detection and/or diagnosis of SARS-CoV-2 by  FDA under an Emergency Use Authorization (EUA). This EUA will remain  in effect (meaning this test can be used) for the duration of the  Covid-19 declaration under Section 564(b)(1) of the Act, 21  U.S.C. section 360bbb-3(b)(1), unless the authorization is  terminated or revoked. Performed at [facility], [place] [address] [place], [place] [address]       Radiology Studies: CT ABDOMEN PELVIS W CONTRAST  Result Date: 08/12/2019 CLINICAL DATA:  Five days of rectal bleeding, jaundice, positive Hemoccult EXAM: CT ABDOMEN AND PELVIS WITH CONTRAST  TECHNIQUE: Multidetector CT imaging of the abdomen and pelvis was performed using the standard protocol following bolus administration of intravenous contrast. CONTRAST:  178m OMNIPAQUE IOHEXOL 300 MG/ML  SOLN COMPARISON:  Ultrasound 07/31/2019, CT 07/21/2019 FINDINGS: Lower chest: Lung bases are clear. Normal heart size. No pericardial effusion. Coronary artery calcification. Hepatobiliary: Diffusely decreased hepatic hypoattenuation with heterogeneous enhancement, may correlate with recent diagnosis of hepatitis and underlying steatosis. Gallbladder and biliary tree is normal. Pancreas: Unremarkable. No pancreatic ductal dilatation or surrounding inflammatory changes. Spleen: Normal in size without focal abnormality. Adrenals/Urinary Tract: Normal adrenals. Kidneys enhance and excrete symmetrically. No concerning renal mass, urolithiasis or hydronephrosis. Mild bladder wall thickening. Stomach/Bowel: Distal esophagus stomach and duodenum free of significant abnormality. Slight mucosal hyperemia and edematous changes of much of the small bowel as well as throughout the colon is similar to slightly more pronounced than on comparison exam on a background of intramural fat within the ascending colon. Scattered colonic diverticula without focal pericolonic inflammation to suggest diverticulitis. Normal appearance of the appendix. Vascular/Lymphatic: The aorta is normal caliber. No suspicious or enlarged lymph nodes in the included lymphatic chains. Reproductive: The prostate and seminal vesicles are unremarkable. Other: Small to moderate volume ascites throughout the abdomen. This is new from comparison exam and may reflect some developing portal hypertension in the setting of hepatopathy. No abdominopelvic free air. Musculoskeletal: No acute osseous abnormality or suspicious osseous lesions. IMPRESSION: 1. Diffusely decreased hepatic hypoattenuation with heterogeneous enhancement, likely to correlate with recent  diagnosis of hepatitis with fibrosis and steatosis. 2. Small to moderate volume ascites throughout the abdomen is new from comparison exam and is concerning for portal hypertension in the setting of hepatopathy. 3. Slight mucosal hyperemia and edematous changes of much of the small bowel as well as throughout the colon is similar to slightly more pronounced than on comparison exam. Somewhat more nonspecific in the setting of ascites and liver disease though certainly an enterocolitis could have this appearance. 4. Mild bladder wall thickening. Recommend correlation with urinalysis to exclude cystitis. Electronically Signed   By: PLovena LeM.D.   On: 08/12/2019 22:56   DG Chest Port 1 View  Result Date: 08/13/2019 CLINICAL DATA:  Leukocytosis. EXAM: PORTABLE CHEST 1 VIEW COMPARISON:  Report from chest  radiograph 07/28/2010 (images unavailable) FINDINGS: Heart size within normal limits. There is no appreciable airspace consolidation. No evidence of pleural effusion or pneumothorax. No acute bony abnormality identified. IMPRESSION: No evidence of acute cardiopulmonary abnormality. Electronically Signed   By: Kellie Simmering DO   On: 08/13/2019 12:32     LOS: 1 day   Time spent: More than 50% of that time was spent in counseling and/or coordination of care.  Antonieta Pert, MD [facility]  08/14/2019, 1:06 PM

## 2019-08-14 NOTE — Anesthesia Postprocedure Evaluation (Signed)
Anesthesia Post Note  Patient: Christopher Carrillo  Procedure(s) Performed: COLONOSCOPY WITH PROPOFOL (N/A ) BIOPSY SUBMUCOSAL TATTOO INJECTION     Patient location during evaluation: PACU Anesthesia Type: MAC Level of consciousness: awake and alert Pain management: pain level controlled Vital Signs Assessment: post-procedure vital signs reviewed and stable Respiratory status: spontaneous breathing, nonlabored ventilation, respiratory function stable and patient connected to nasal cannula oxygen Cardiovascular status: stable and blood pressure returned to baseline Postop Assessment: no apparent nausea or vomiting Anesthetic complications: no    Last Vitals:  Vitals:   08/14/19 1135 08/14/19 1220  BP: (!) 100/54 96/64  Pulse: 84 86  Resp: 16 16  Temp:  36.7 C  SpO2: 100% 100%                  Effie Berkshire

## 2019-08-14 NOTE — Op Note (Signed)
[facility] Patient Name: Christopher Carrillo Procedure Date : 08/14/2019 MRN: [id number] Attending MD: Thornton Park MD, MD Date of Birth: Apr 26, 1970 CSN: [id number] Age: 49 Admit Type: Inpatient Procedure:                Colonoscopy Indications:              Hematochezia Providers:                Thornton Park MD, MD, Josie Dixon, RN,                            Lazaro Arms, Technician Referring MD:              Medicines:                Monitored Anesthesia Care Complications:            No immediate complications. Estimated blood loss:                            Minimal. Estimated Blood Loss:     Estimated blood loss was minimal. Procedure:                Pre-Anesthesia Assessment:                           - Prior to the procedure, a History and Physical                            was performed, and patient medications and                            allergies were reviewed. The patient's tolerance of                            previous anesthesia was also reviewed. The risks                            and benefits of the procedure and the sedation                            options and risks were discussed with the patient.                            All questions were answered, and informed consent                            was obtained. Prior Anticoagulants: The patient has                            taken no previous anticoagulant or antiplatelet                            agents. ASA Grade Assessment: III - A patient with                            severe systemic disease. After  reviewing the risks                            and benefits, the patient was deemed in                            satisfactory condition to undergo the procedure.                           After obtaining informed consent, the colonoscope                            was passed under direct vision. Throughout the                            procedure, the patient's blood pressure,  pulse, and                            oxygen saturations were monitored continuously. The                            CF-HQ190L ([id number]) Olympus colonoscope was                            introduced through the anus and advanced to the 5                            cm into the ileum. A second forward view of the                            right colon was performed. The colonoscopy was                            performed without difficulty. The patient tolerated                            the procedure well. The quality of the bowel                            preparation was good. The terminal ileum, ileocecal                            valve, appendiceal orifice, and rectum were                            photographed. Scope In: 10:52:38 AM Scope Out: 11:07:58 AM Scope Withdrawal Time: 0 hours 13 minutes 34 seconds  Total Procedure Duration: 0 hours 15 minutes 20 seconds  Findings:      Hemorrhoids were found on perianal exam.      Non-bleeding internal hemorrhoids were found. The hemorrhoids were       medium-sized.      Multiple small and large-mouthed diverticula were found in the entire       colon.      A small area of ulceration and possible  polyp was found in the       descending colon located 46 cm from the anal verge. This is in the area       of the described polypectomy from 07/20/19. The possible residual polyp       was removed with a cold biopsy forceps. Resection and retrieval were       complete. Area was tattooed with an injection of 2 mL of Spot (carbon       black).      No blood was seen in the colon or the examined small intestines. The       exam was otherwise without abnormality on direct and retroflexion views.       In particular, the IC valve appeared normal. Impression:               - Internal and external hemorrhoids are the likely                            source of rectal bleeding.                           - Diverticulosis in the entire examined  colon.                           - Ulceration with possible residual polyp located                            in the descending colon. Resected and retrieved.                            Tattooed.                           - No evidence for active or recent bleeding. Recommendation:           - Return patient to hospital ward for ongoing care.                           - Resume previous diet after paracentesis today.                           - Continue present medications.                           - Await pathology results.                           - Add daily psyllium or methylcellulose for stool                            bulking.                           - Start using Anusol HC 2.5% applied sparingly to                            the rectum twice daily.                           -  Sitz baths may provide some additional relief                            during episodes of bleeding.                           - Repeat colonoscopy date to be determined after                            pending pathology results are reviewed for                            surveillance. Procedure Code(s):        --- Professional ---                           [phone], Colonoscopy, flexible; with directed                            submucosal injection(s), any substance                           65035, Colonoscopy, flexible; with biopsy, single                            or multiple Diagnosis Code(s):        --- Professional ---                           K64.8, Other hemorrhoids                           K63.5, Polyp of colon                           K92.1, Melena (includes Hematochezia)                           K57.30, Diverticulosis of large intestine without                            perforation or abscess without bleeding CPT copyright 2019 American Medical Association. All rights reserved. The codes documented in this report are preliminary and upon coder review may  be revised to meet current  compliance requirements. Thornton Park MD, MD 08/14/2019 11:26:25 AM This report has been signed electronically. Number of Addenda: 0

## 2019-08-14 NOTE — Procedures (Signed)
Ultrasound-guided diagnostic and therapeutic paracentesis performed yielding 620 mililiters of amber colored fluid.  Fluid was sent to lab for analysis. No immediate complications. EBL is none.

## 2019-08-14 NOTE — Interval H&P Note (Signed)
History and Physical Interval Note:  08/14/2019 10:33 AM  Christopher Carrillo  has presented today for surgery, with the diagnosis of Rectal bleeding.  The various methods of treatment have been discussed with the patient and family. After consideration of risks, benefits and other options for treatment, the patient has consented to  Procedure(s): COLONOSCOPY WITH PROPOFOL (N/A) as a surgical intervention.  The patient's history has been reviewed, patient examined, no change in status, stable for surgery.  I have reviewed the patient's chart and labs.  Questions were answered to the patient's satisfaction.     Thornton Park

## 2019-08-14 NOTE — Transfer of Care (Signed)
Immediate Anesthesia Transfer of Care Note  Patient: Christopher Carrillo  Procedure(s) Performed: COLONOSCOPY WITH PROPOFOL (N/A ) BIOPSY SUBMUCOSAL TATTOO INJECTION  Patient Location: PACU  Anesthesia Type:MAC  Level of Consciousness: awake and alert   Airway & Oxygen Therapy: Patient Spontanous Breathing  Post-op Assessment: Report given to RN and Post -op Vital signs reviewed and stable  Post vital signs: Reviewed and stable  Last Vitals:  Vitals Value Taken Time  BP    Temp    Pulse    Resp    SpO2      Last Pain:  Vitals:   08/14/19 1006  TempSrc:   PainSc: 7       Patients Stated Pain Goal: 0 (21/03/12 8118)  Complications: No apparent anesthesia complications

## 2019-08-14 NOTE — Anesthesia Procedure Notes (Signed)
Procedure Name: MAC Date/Time: 08/14/2019 10:44 AM Performed by: Oletta Lamas, CRNA Pre-anesthesia Checklist: Patient identified, Suction available, Emergency Drugs available, Patient being monitored and Timeout performed Patient Re-evaluated:Patient Re-evaluated prior to induction Oxygen Delivery Method: Nasal cannula

## 2019-08-14 NOTE — Anesthesia Preprocedure Evaluation (Signed)
Anesthesia Evaluation  Patient identified by MRN, date of birth, ID band Patient awake    Reviewed: Allergy & Precautions, NPO status , Patient's Chart, lab work & pertinent test results  Airway Mallampati: II  TM Distance: >3 FB Neck ROM: Full    Dental  (+) Teeth Intact, Dental Advisory Given   Pulmonary Current Smoker and Patient abstained from smoking.,    breath sounds clear to auscultation       Cardiovascular hypertension, Pt. on medications  Rhythm:Regular Rate:Normal     Neuro/Psych PSYCHIATRIC DISORDERS Anxiety Depression    GI/Hepatic GERD  ,(+) Hepatitis -  Endo/Other  negative endocrine ROS  Renal/GU negative Renal ROS     Musculoskeletal negative musculoskeletal ROS (+)   Abdominal Normal abdominal exam  (+)   Peds  Hematology   Anesthesia Other Findings   Reproductive/Obstetrics                             Anesthesia Physical Anesthesia Plan  ASA: III  Anesthesia Plan: MAC   Post-op Pain Management:    Induction: Intravenous  PONV Risk Score and Plan: 0 and Propofol infusion  Airway Management Planned: Natural Airway and Simple Face Mask  Additional Equipment: None  Intra-op Plan:   Post-operative Plan:   Informed Consent: I have reviewed the patients History and Physical, chart, labs and discussed the procedure including the risks, benefits and alternatives for the proposed anesthesia with the patient or authorized representative who has indicated his/her understanding and acceptance.       Plan Discussed with: CRNA  Anesthesia Plan Comments: (Lab Results      Component                Value               Date                      WBC                      25.0 (H)            08/14/2019                HGB                      10.8 (L)            08/14/2019                HCT                      32.7 (L)            08/14/2019                MCV                       108.6 (H)           08/14/2019                PLT                      437 (H)             08/14/2019           )  Anesthesia Quick Evaluation  

## 2019-08-15 LAB — COMPREHENSIVE METABOLIC PANEL
ALT: 63 U/L — ABNORMAL HIGH (ref 0–44)
AST: 86 U/L — ABNORMAL HIGH (ref 15–41)
Albumin: 2.8 g/dL — ABNORMAL LOW (ref 3.5–5.0)
Alkaline Phosphatase: 110 U/L (ref 38–126)
Anion gap: 9 (ref 5–15)
BUN: 13 mg/dL (ref 6–20)
CO2: 22 mmol/L (ref 22–32)
Calcium: 8.5 mg/dL — ABNORMAL LOW (ref 8.9–10.3)
Chloride: 103 mmol/L (ref 98–111)
Creatinine, Ser: 0.76 mg/dL (ref 0.61–1.24)
GFR calc Af Amer: >60 mL/min (ref >60)
GFR calc non Af Amer: >60 mL/min (ref >60)
Glucose, Bld: 130 mg/dL — ABNORMAL HIGH (ref 70–99)
Potassium: 3.7 mmol/L (ref 3.5–5.1)
Sodium: 134 mmol/L — ABNORMAL LOW (ref 135–145)
Total Bilirubin: 13.4 mg/dL — ABNORMAL HIGH (ref 0.3–1.2)
Total Protein: 5.6 g/dL — ABNORMAL LOW (ref 6.5–8.1)

## 2019-08-15 LAB — CBC
HCT: 28.7 % — ABNORMAL LOW (ref 39.0–52.0)
Hemoglobin: 9.6 g/dL — ABNORMAL LOW (ref 13.0–17.0)
MCH: 35.8 pg — ABNORMAL HIGH (ref 26.0–34.0)
MCHC: 33.4 g/dL (ref 30.0–36.0)
MCV: 107.1 fL — ABNORMAL HIGH (ref 80.0–100.0)
Platelets: 363 10*3/uL (ref 150–400)
RBC: 2.68 MIL/uL — ABNORMAL LOW (ref 4.22–5.81)
RDW: 15.9 % — ABNORMAL HIGH (ref 11.5–15.5)
WBC: 15.4 10*3/uL — ABNORMAL HIGH (ref 4.0–10.5)
nRBC: 0 % (ref 0.0–0.2)

## 2019-08-15 MED ORDER — HYDROCORTISONE ACETATE 25 MG RE SUPP: 25.0000 mg | Freq: Two times a day (BID) | RECTAL | 0 refills | Status: DC

## 2019-08-15 NOTE — Progress Notes (Signed)
Pt discharge education went over at bedside  Pt IV removed by NT, catheter intact and telemetry removed  Pt has all belongings  Pt denies wheelchair, pt ambulated off unit with RN

## 2019-08-15 NOTE — Discharge Summary (Signed)
Physician Discharge Summary  Christopher Carrillo KAJ:[id number] DOB: 1971/03/17 DOA: 08/12/2019  PCP: Alycia Rossetti, MD  Admit date: 08/12/2019 Discharge date: 08/15/2019  Admitted From: home Disposition:  home  Recommendations for Outpatient Follow-up:  1. Follow up with PCP in 1-2 weeks, and with liver clinic as scheduled on 5/17 2. Please obtain BMP/CBC in one week  Home Health:no  Equipment/Devices: none  Discharge Condition: Stable Code Status: Full Diet recommendation: Heart Health/lwo salt  Brief/Interim Summary: 49 year old male with significant medical history of alcoholic hepatitis, macrocytic anemia, hypertension, psoriasis, EtOH abuse, tobacco use who is coming to the emergency department referred by his PCP the due to persistent rectal bleeding for the past 5 days associated with abdominal pain and distention.   He had a colonoscopy on 07/08/2019 due to what felt to be a colon mass, but subsequently he was diagnosed with intermittent ileocecal intussusception after he had benign enteric mucosa findings. The patient was admitted from 07/21/2019 to 07/26/2019 and treated for confusion, colitis and rising bilirubin level at [facility]. It he was believed that he may have developed hepatitis induced by ketamine and propofol in the setting of chronic alcohol abuse. He was subsequently admitted again at [facility] for similar symptoms but now with lower extremity edema and confusion. His ammonia was slightly elevated and he was started on lactulose. He was discharged on prednisolone for possible alcohol induced hepatitis. He was put on prednisolone for alcoholic hepatitis, furosemide, Aldactone and lactulose at discharge with follow-up with GI (Dr. Bryan Lemma). [facility] [place] [facility]26/2019.  ED Course:Initial vital signs were temperature99.2 F, pulse 97, respiration 14, blood pressure 111/63 mmHg and O2 sat 100%. The patient was given at  1000 mL bolus in the ER. WBC 24.4, hemoglobin 11.6 g/dL and platelets 505. CMP shows normal electrolytes and renal function. Glucose 126 mg/dL. Total protein is 5.7, albumin 2.8 g/dL. AST is 95, ALT 59 and alkaline phosphatase 129 units/L. Total bilirubin was 17.3 mg/dL. CT abdomen/pelvis with contrast showed diffusely decreased hepatic hypoattenuation with heterogeneous enhancement, likely to correlate with recent diagnosis of hepatitis with fibrosis and asteatosis. There is small to moderate volume ascites throughout the abdomen which is new and concerning for portal hypertension. Slight mucosal hyperemia and edematous changes of much of the small bowel as well as throughout the colon. Mild bladder wall thickening.   He was addmitted serial H&H very stable.  Seen by GI and underwent colonoscopy as below Found to have  "Internal and external hemorrhoids are the likely source of rectal bleeding. - Diverticulosis in the entire examined colon. - Ulceration with possible residual polyp located in the descending colon. Resected and retrieved. Tattooed. - No evidence for active or recent bleeding. GI recommendation: - Await pathology results. - Add daily psyllium or methylcellulose for stool bulking. - Start using Anusol HC 2.5% applied sparingly to the rectum twice daily. - Sitz baths may provide some additional relief during episodes of bleeding. - Repeat colonoscopy date to be determined after pending pathology results are reviewed for surveillance Underwent paracentesis no evidence of SBP ( ascitic labs reviewed by GI) Patient was monitored additional night.TB down to 13.5, ast/alt stable, wbc down to 15k from 25k. He feels "great" and will be discharged home , wife updated at beside   Discharge Diagnoses:   Hematochezia/bleeding per rectum: Underwent colonoscopy suspecting hemorrhoidal bleeding added on  anusol cream bid, stool bulking and follow-up with GI.  Recent Labs  Lab  08/12/19 1646 08/13/19 0040 08/13/19 0659 08/14/19 0715 08/15/19 0855  HGB 11.6* 10.3* 10.6* 10.8* 9.6*  HCT 34.8* 30.7* 31.6* 32.7* 28.7*   Acute hepatitis/jaundice/acute liver failure without hepatic coma/mild coagulopathy/recent hepatic encephalopathy: Recently admitted and discharged.  He is on prednisone and will be continued.  LFTs with a stable AST ALT slightly high (total bili 15). Continue diuretics for his ascites s/p diagnostic tap after colonoscopy no evidence of SBP.Follow-up at liver clinic on 08/26/2019.  Last liver biopsy 08/03/2019. Recent Labs  Lab 08/10/19 1626 08/12/19 1646 08/13/19 0600 08/13/19 0659 08/14/19 0715 08/15/19 0855  AST 88* 95*  --  86* 83* 86*  ALT 48* 59*  --  55* 62* 63*  ALKPHOS  --  129*  --  111 121 110  BILITOT 19.2* 17.3*  --  15.3* 15.1* 13.4*  PROT 5.5* 5.7*  --  5.5* 5.5* 5.6*  ALBUMIN  --  2.8*  --  2.6* 2.7* 2.8*  INR  --   --  1.6*  --   --   --     Metabolic acidosis overall stable. Cont po hydration. Tobacco WNU:UVOZDGUY nicotine replacement therapy.  He stated he will quit on his own. Essential hypertension: Blood pressure on lower side-somewhat expected given his liver disease, he is on Lasix and Aldactone- cont cautiously. Anemia of chronic disease: Hemoglobin is stable.  Monitor. Leukocytosis: She has been up since last admission.Checked chest x-ray UA-serially unremarkable for infection. Fever is absent. Of note is on prednisone which is likely contributing Recent Labs  Lab 08/10/19 1626 08/12/19 1646 08/13/19 0659 08/14/19 0715 08/15/19 0855  WBC 20.3* 24.4* 19.8* 25.0* 15.4*   Consults:  gi  Subjective: No new complaints, feels well and wants to go home. Discharge Exam: Vitals:   08/15/19 0450 08/15/19 0901  BP: 102/62 104/62  Pulse: 85 87  Resp: 18   Temp: 98.3 F (36.8 C)   SpO2: 100%    General: Pt is alert, awake, not in acute distress. Icteric. Cardiovascular: RRR, S1/S2 +, no rubs, no  gallops Respiratory: CTA bilaterally, no wheezing, no rhonchi Abdominal: Soft, NT, mildly Distended/full, bowel sounds + Extremities: no edema, no cyanosis  Discharge Instructions  Discharge Instructions    Diet - low sodium heart healthy   Complete by: As directed    Discharge instructions   Complete by: As directed    Follow-up with the GI liver clinic as scheduled.  Please call call MD or return to ER for similar or worsening recurring problem that brought you to hospital or if any fever,nausea/vomiting,abdominal pain, uncontrolled pain, chest pain,  shortness of breath or any other alarming symptoms.  Please follow-up your doctor as instructed in a week time and call the office for appointment.  Please avoid alcohol, smoking, or any other illicit substance and maintain healthy habits including taking your regular medications as prescribed.  You were cared for by a hospitalist during your hospital stay. If you have any questions about your discharge medications or the care you received while you were in the hospital after you are discharged, you can call the unit and ask to speak with the hospitalist on call if the hospitalist that took care of you is not available.  Once you are discharged, your primary care physician will handle any further medical issues. Please note that NO REFILLS for any discharge medications will be authorized once you are discharged, as it is imperative that you return to your primary care physician (or establish a relationship  with a primary care physician if you do not have one) for your aftercare needs so that they can reassess your need for medications and monitor your lab values   Increase activity slowly   Complete by: As directed      Allergies as of 08/15/2019      Reactions   Other Other (See Comments)   Sea weed salad : swelling of gums.      Medication List    TAKE these medications   CLEAR EYES COMPLETE OP Place 1 drop into both eyes daily as  needed (for dry eyes).   folic acid 1 MG tablet Commonly known as: FOLVITE Take 1 tablet (1 mg total) by mouth daily.   furosemide 20 MG tablet Commonly known as: LASIX Take 1 tablet (20 mg total) by mouth daily.   hydrocortisone 25 MG suppository Commonly known as: ANUSOL-HC Place 1 suppository (25 mg total) rectally 2 (two) times daily.   lactulose 10 GM/15ML solution Commonly known as: CHRONULAC Take 15 mLs (10 g total) by mouth 2 (two) times daily. Reduce or stop taking if you have more than 3 bowel movements a day   multivitamin with minerals Tabs tablet Take 1 tablet by mouth daily. What changed: Another medication with the same name was removed. Continue taking this medication, and follow the directions you see here.   oxycodone 5 MG capsule Commonly known as: OXY-IR Take 5 mg by mouth every 4 (four) hours as needed.   polyethylene glycol 17 g packet Commonly known as: MIRALAX / GLYCOLAX Take 17 g by mouth daily as needed for mild constipation.   prednisoLONE 5 MG Tabs tablet Take 8 tablets (40 mg total) by mouth daily for 25 days.   spironolactone 50 MG tablet Commonly known as: ALDACTONE Take 1 tablet (50 mg total) by mouth daily.   tamsulosin 0.4 MG Caps capsule Commonly known as: FLOMAX Take 1 capsule (0.4 mg total) by mouth daily after supper.      Follow-up Information    [place], Modena Nunnery, MD Follow up in 1 week(s).   Specialty: Family Medicine Contact information: [address] [place] [address] [phone]          Allergies  Allergen Reactions  . Other Other (See Comments)    Sea weed salad : swelling of gums.    The results of significant diagnostics from this hospitalization (including imaging, microbiology, ancillary and laboratory) are listed below for reference.    Microbiology: Recent Results (from the past 240 hour(s))  Respiratory Panel by RT PCR (Flu A&B, Covid) - Nasopharyngeal Swab     Status: None   Collection  Time: 08/13/19  9:39 AM   Specimen: Nasopharyngeal Swab  Result Value Ref Range Status   SARS Coronavirus 2 by RT PCR NEGATIVE NEGATIVE Final    Comment: (NOTE) SARS-CoV-2 target nucleic acids are NOT DETECTED. The SARS-CoV-2 RNA is generally detectable in upper respiratoy specimens during the acute phase of infection. The lowest concentration of SARS-CoV-2 viral copies this assay can detect is 131 copies/mL. A negative result does not preclude SARS-Cov-2 infection and should not be used as the sole basis for treatment or other patient management decisions. A negative result may occur with  improper specimen collection/handling, submission of specimen other than nasopharyngeal swab, presence of viral mutation(s) within the areas targeted by this assay, and inadequate number of viral copies (<131 copies/mL). A negative result must be combined with clinical observations, patient history, and epidemiological information.  The expected result is Negative. Fact Sheet for Patients:  PinkCheek.be Fact Sheet for Healthcare Providers:  GravelBags.it This test is not yet ap proved or cleared by the [place] FDA and  has been authorized for detection and/or diagnosis of SARS-CoV-2 by FDA under an Emergency Use Authorization (EUA). This EUA will remain  in effect (meaning this test can be used) for the duration of the COVID-19 declaration under Section 564(b)(1) of the Act, 21 U.S.C. section 360bbb-3(b)(1), unless the authorization is terminated or revoked sooner.    Influenza A by PCR NEGATIVE NEGATIVE Final   Influenza B by PCR NEGATIVE NEGATIVE Final    Comment: (NOTE) The Xpert Xpress SARS-CoV-2/FLU/RSV assay is intended as an aid in  the diagnosis of influenza from Nasopharyngeal swab specimens and  should not be used as a sole basis for treatment. Nasal washings and  aspirates are unacceptable for Xpert Xpress  SARS-CoV-2/FLU/RSV  testing. Fact Sheet for Patients: PinkCheek.be Fact Sheet for Healthcare Providers: GravelBags.it This test is not yet approved or cleared by the [place] FDA and  has been authorized for detection and/or diagnosis of SARS-CoV-2 by  FDA under an Emergency Use Authorization (EUA). This EUA will remain  in effect (meaning this test can be used) for the duration of the  Covid-19 declaration under Section 564(b)(1) of the Act, 21  U.S.C. section 360bbb-3(b)(1), unless the authorization is  terminated or revoked. Performed at [facility], [place] [address] [place], [place] [address]   Gram stain     Status: None   Collection Time: 08/14/19  1:16 PM   Specimen: Abdomen; Peritoneal Fluid  Result Value Ref Range Status   Specimen Description PERITONEAL FLUID  Final   Special Requests ABDOMEN  Final   Gram Stain   Final    WBC PRESENT, PREDOMINANTLY MONONUCLEAR NO ORGANISMS SEEN CYTOSPIN SMEAR Performed at [facility], [place] [address], [place], [place] [address]    Report Status 08/14/2019 FINAL  Final  Culture, body fluid-bottle     Status: None (Preliminary result)   Collection Time: 08/14/19  1:16 PM   Specimen: Peritoneal Washings  Result Value Ref Range Status   Specimen Description PERITONEAL FLUID  Final   Special Requests ABDOMEN  Final   Culture   Final    NO GROWTH < 24 HOURS Performed at [facility], [place] [address], [place], [place] [address]    Report Status PENDING  Incomplete    Procedures/Studies: US Abdomen Complete  Result Date: 07/21/2019 CLINICAL DATA:  Abdominal distension in jaundice EXAM: ABDOMEN ULTRASOUND COMPLETE COMPARISON:  07/08/2019 FINDINGS: Gallbladder: No gallstones or wall thickening visualized. No sonographic Murphy sign noted by sonographer. Common bile duct: Diameter: 2.2 mm. Liver: Hepatomegaly is noted. Increased echogenicity with heterogeneity  is seen consistent with fatty infiltration noted on prior CT examination. Portal vein is patent on color Doppler imaging with normal direction of blood flow towards the liver. IVC: No abnormality visualized. Pancreas: Visualized portion unremarkable. Spleen: Size and appearance within normal limits. Right Kidney: Length: 12.4 cm. Echogenicity within normal limits. No mass or hydronephrosis visualized. Left Kidney: Length: 11.1 cm. 1 cm small cyst is noted in the lower pole of the left kidney. No mass lesion or hydronephrosis is noted. Abdominal aorta: No aneurysm visualized. Other findings: None. IMPRESSION: Fatty liver with hepatomegaly.  No mass lesion is seen. Small left renal cyst Electronically Signed   By: Inez Catalina M.D.   On: 07/21/2019 17:58  CT ABDOMEN PELVIS W CONTRAST  Result Date: 08/12/2019 CLINICAL DATA:  Five days of rectal bleeding, jaundice, positive Hemoccult EXAM: CT ABDOMEN AND PELVIS WITH CONTRAST TECHNIQUE: Multidetector CT imaging of the abdomen and pelvis was performed using the standard protocol following bolus administration of intravenous contrast. CONTRAST:  124m OMNIPAQUE IOHEXOL 300 MG/ML  SOLN COMPARISON:  Ultrasound 07/31/2019, CT 07/21/2019 FINDINGS: Lower chest: Lung bases are clear. Normal heart size. No pericardial effusion. Coronary artery calcification. Hepatobiliary: Diffusely decreased hepatic hypoattenuation with heterogeneous enhancement, may correlate with recent diagnosis of hepatitis and underlying steatosis. Gallbladder and biliary tree is normal. Pancreas: Unremarkable. No pancreatic ductal dilatation or surrounding inflammatory changes. Spleen: Normal in size without focal abnormality. Adrenals/Urinary Tract: Normal adrenals. Kidneys enhance and excrete symmetrically. No concerning renal mass, urolithiasis or hydronephrosis. Mild bladder wall thickening. Stomach/Bowel: Distal esophagus stomach and duodenum free of significant abnormality. Slight mucosal  hyperemia and edematous changes of much of the small bowel as well as throughout the colon is similar to slightly more pronounced than on comparison exam on a background of intramural fat within the ascending colon. Scattered colonic diverticula without focal pericolonic inflammation to suggest diverticulitis. Normal appearance of the appendix. Vascular/Lymphatic: The aorta is normal caliber. No suspicious or enlarged lymph nodes in the included lymphatic chains. Reproductive: The prostate and seminal vesicles are unremarkable. Other: Small to moderate volume ascites throughout the abdomen. This is new from comparison exam and may reflect some developing portal hypertension in the setting of hepatopathy. No abdominopelvic free air. Musculoskeletal: No acute osseous abnormality or suspicious osseous lesions. IMPRESSION: 1. Diffusely decreased hepatic hypoattenuation with heterogeneous enhancement, likely to correlate with recent diagnosis of hepatitis with fibrosis and steatosis. 2. Small to moderate volume ascites throughout the abdomen is new from comparison exam and is concerning for portal hypertension in the setting of hepatopathy. 3. Slight mucosal hyperemia and edematous changes of much of the small bowel as well as throughout the colon is similar to slightly more pronounced than on comparison exam. Somewhat more nonspecific in the setting of ascites and liver disease though certainly an enterocolitis could have this appearance. 4. Mild bladder wall thickening. Recommend correlation with urinalysis to exclude cystitis. Electronically Signed   By: PLovena LeM.D.   On: 08/12/2019 22:56   CT ABDOMEN PELVIS W CONTRAST  Result Date: 07/21/2019 CLINICAL DATA:  Elevated LFT and jaundice. Rule out common duct stone EXAM: CT ABDOMEN AND PELVIS WITH CONTRAST TECHNIQUE: Multidetector CT imaging of the abdomen and pelvis was performed using the standard protocol following bolus administration of intravenous  contrast. CONTRAST:  1035mOMNIPAQUE IOHEXOL 300 MG/ML  SOLN COMPARISON:  Ultrasound abdomen 07/21/2019. CT abdomen pelvis 07/08/2019 FINDINGS: Lower chest: Lung bases clear bilaterally. Hepatobiliary: Marked fatty infiltration liver which is diffusely low density and enlarged. There are scattered patchy areas of fatty sparing in the liver. Gallbladder and bile ducts normal. No biliary dilatation. Pancreas: Negative for mass or edema. Spleen: Negative Adrenals/Urinary Tract: Adrenal glands are unremarkable. Kidneys are normal, without renal calculi, focal lesion, or hydronephrosis. Bladder is unremarkable. Stomach/Bowel: Negative for bowel obstruction. Diverticulosis in the cecum. There is mucosal edema throughout most of the right colon. This appears slightly improved from the prior study and may be due to colitis. Although there are diverticula in the cecum, the edema extends throughout the right colon therefore infectious colitis seems more likely than diverticulitis. Bowel ischemia also a consideration. Normal appendix. Vascular/Lymphatic: Mild atherosclerotic disease. No aneurysm. Negative for lymphadenopathy. Reproductive: Prostate not enlarged. Other:  Small amount of free fluid in the pelvis. Musculoskeletal: No acute skeletal abnormality. IMPRESSION: 1. Severe fatty infiltration liver.  No biliary dilatation. 2. Edema in the right colon. Possible infectious or ischemic colitis. Review of the prior CT reveals edema in the cecum with possible intussusception of the ileum into the colon. This appears to have resolved in the interval. Electronically Signed   By: Franchot Gallo M.D.   On: 07/21/2019 20:24   US Abdomen Limited  Result Date: 07/31/2019 CLINICAL DATA:  49 year old male with abdominal distension. Alcoholic hepatitis and jaundice. Evaluate for ascites. EXAM: LIMITED ABDOMEN ULTRASOUND FOR ASCITES TECHNIQUE: Limited ultrasound survey for ascites was performed in all four abdominal quadrants.  COMPARISON:  Right upper quadrant ultrasound dated 07/21/2019. FINDINGS: There is a very small ascites in the lower abdomen. There is diffuse increased liver echogenicity most commonly seen in the setting of fatty infiltration. Superimposed inflammation or fibrosis is not excluded. Clinical correlation is recommended. IMPRESSION: Small ascites. Electronically Signed   By: Anner Crete M.D.   On: 07/31/2019 17:48   US BIOPSY (LIVER)  Result Date: 08/03/2019 CLINICAL DATA:  Jaundice, worsening bilirubin EXAM: ULTRASOUND-GUIDED CORE LIVER BIOPSY TECHNIQUE: An ultrasound guided liver biopsy was thoroughly discussed with the patient and questions were answered. The benefits, risks, alternatives, and complications were also discussed. The patient understands and wishes to proceed with the procedure. A verbal as well as written consent was obtained. Survey ultrasound of the liver was performed and an appropriate skin entry site was determined. Skin site was marked, prepped with chlorhexidine, and draped in usual sterile fashion, and infiltrated locally with 1% lidocaine. Intravenous Fentanyl 28mg and Versed 140mwere administered as conscious sedation during continuous monitoring of the patient's level of consciousness and physiological / cardiorespiratory status by the radiology RN, with a total moderate sedation time of 10 minutes. A 17 gauge trocar needle was advanced under ultrasound guidance into the liver. 3 solid-appearing coaxial 18gauge core samples were then obtained through the guide needle. The guide needle was removed. Post procedure scans demonstrate no apparent complication. COMPLICATIONS: COMPLICATIONS None immediate FINDINGS: Only trace abdominal ascites was noted in the left lower quadrant of the abdomen. No focal liver lesion identified on survey interrogation. Core biopsy samples of the right lobe obtained as above. IMPRESSION: 1. Technically successful ultrasound guided core liver biopsy.  Electronically Signed   By: D Lucrezia Europe.D.   On: 08/03/2019 10:57   DG Chest Port 1 View  Result Date: 08/13/2019 CLINICAL DATA:  Leukocytosis. EXAM: PORTABLE CHEST 1 VIEW COMPARISON:  Report from chest radiograph 07/28/2010 (images unavailable) FINDINGS: Heart size within normal limits. There is no appreciable airspace consolidation. No evidence of pleural effusion or pneumothorax. No acute bony abnormality identified. IMPRESSION: No evidence of acute cardiopulmonary abnormality. Electronically Signed   By: KyKellie SimmeringO   On: 08/13/2019 12:32   IR Paracentesis  Result Date: 08/14/2019 INDICATION: Patient with history of hyperbilirubinemia found to have ascites presents for therapeutic and diagnostic paracentesis EXAM: ULTRASOUND GUIDED THERAPEUTIC AND DIAGNOSTIC PARACENTESIS MEDICATIONS: Lidocaine 1% 10 mL COMPLICATIONS: None immediate. PROCEDURE: Informed written consent was obtained from the patient after a discussion of the risks, benefits and alternatives to treatment. A timeout was performed prior to the initiation of the procedure. Initial ultrasound scanning demonstrates a small amount of ascites within the right lower abdominal quadrant. The right lower abdomen was prepped and draped in the usual sterile fashion. 1% lidocaine was used for local anesthesia. Following this, a 6 Fr  Safe-T-Centesis catheter was introduced. An ultrasound image was saved for documentation purposes. The paracentesis was performed. The catheter was removed and a dressing was applied. The patient tolerated the procedure well without immediate post procedural complication. FINDINGS: A total of approximately 620 mL of amber colored fluid was removed. Samples were sent to the laboratory as requested by the clinical team. IMPRESSION: Successful ultrasound-guided therapeutic and diagnostic paracentesis yielding 620 mL of peritoneal fluid. Read by: Rushie Nyhan, NP Electronically Signed   By: Jacqulynn Cadet M.D.   On:  08/14/2019 13:32    Labs: BNP (last 3 results) No results for input(s): BNP in the last 8760 hours. Basic Metabolic Panel: Recent Labs  Lab 08/10/19 1626 08/12/19 1646 08/13/19 0659 08/14/19 0715 08/15/19 0855  NA 138 136 137 137 134*  K 3.8 3.8 4.0 3.8 3.7  CL 103 103 106 106 103  CO2 25 23 19* 19* 22  GLUCOSE 124* 126* 98 118* 130*  BUN 15 12 11 15 13   CREATININE 0.65 0.80 0.69 1.04 0.76  CALCIUM 9.1 9.0 8.4* 8.5* 8.5*  MG  --   --  2.0  --   --   PHOS  --   --  4.8*  --   --    Liver Function Tests: Recent Labs  Lab 08/10/19 1626 08/12/19 1646 08/13/19 0659 08/14/19 0715 08/15/19 0855  AST 88* 95* 86* 83* 86*  ALT 48* 59* 55* 62* 63*  ALKPHOS  --  129* 111 121 110  BILITOT 19.2* 17.3* 15.3* 15.1* 13.4*  PROT 5.5* 5.7* 5.5* 5.5* 5.6*  ALBUMIN  --  2.8* 2.6* 2.7* 2.8*   No results for input(s): LIPASE, AMYLASE in the last 168 hours. No results for input(s): AMMONIA in the last 168 hours. CBC: Recent Labs  Lab 08/10/19 1626 08/10/19 1626 08/12/19 1646 08/13/19 0040 08/13/19 0659 08/14/19 0715 08/15/19 0855  WBC 20.3*  --  24.4*  --  19.8* 25.0* 15.4*  NEUTROABS 18,067*  --   --   --  16.3*  --   --   HGB 11.4*   < > 11.6* 10.3* 10.6* 10.8* 9.6*  HCT 31.4*   < > 34.8* 30.7* 31.6* 32.7* 28.7*  MCV 99.1  --  107.7*  --  107.5* 108.6* 107.1*  PLT 505*  --  505*  --  452* 437* 363   < > = values in this interval not displayed.   Cardiac Enzymes: No results for input(s): CKTOTAL, CKMB, CKMBINDEX, TROPONINI in the last 168 hours. BNP: Invalid input(s): POCBNP CBG: No results for input(s): GLUCAP in the last 168 hours. D-Dimer No results for input(s): DDIMER in the last 72 hours. Hgb A1c No results for input(s): HGBA1C in the last 72 hours. Lipid Profile No results for input(s): CHOL, HDL, LDLCALC, TRIG, CHOLHDL, LDLDIRECT in the last 72 hours. Thyroid function studies No results for input(s): TSH, T4TOTAL, T3FREE, THYROIDAB in the last 72  hours.  Invalid input(s): FREET3 Anemia work up No results for input(s): VITAMINB12, FOLATE, FERRITIN, TIBC, IRON, RETICCTPCT in the last 72 hours. Urinalysis    Component Value Date/Time   COLORURINE AMBER (A) 08/13/2019 1330   APPEARANCEUR CLEAR 08/13/2019 1330   LABSPEC 1.018 08/13/2019 1330   PHURINE 6.0 08/13/2019 1330   GLUCOSEU NEGATIVE 08/13/2019 1330   HGBUR NEGATIVE 08/13/2019 1330   BILIRUBINUR SMALL (A) 08/13/2019 1330   KETONESUR NEGATIVE 08/13/2019 1330   PROTEINUR NEGATIVE 08/13/2019 1330   NITRITE NEGATIVE 08/13/2019 1330   LEUKOCYTESUR NEGATIVE 08/13/2019  1330   Sepsis Labs Invalid input(s): PROCALCITONIN,  WBC,  LACTICIDVEN Microbiology Recent Results (from the past 240 hour(s))  Respiratory Panel by RT PCR (Flu A&B, Covid) - Nasopharyngeal Swab     Status: None   Collection Time: 08/13/19  9:39 AM   Specimen: Nasopharyngeal Swab  Result Value Ref Range Status   SARS Coronavirus 2 by RT PCR NEGATIVE NEGATIVE Final    Comment: (NOTE) SARS-CoV-2 target nucleic acids are NOT DETECTED. The SARS-CoV-2 RNA is generally detectable in upper respiratoy specimens during the acute phase of infection. The lowest concentration of SARS-CoV-2 viral copies this assay can detect is 131 copies/mL. A negative result does not preclude SARS-Cov-2 infection and should not be used as the sole basis for treatment or other patient management decisions. A negative result may occur with  improper specimen collection/handling, submission of specimen other than nasopharyngeal swab, presence of viral mutation(s) within the areas targeted by this assay, and inadequate number of viral copies (<131 copies/mL). A negative result must be combined with clinical observations, patient history, and epidemiological information. The expected result is Negative. Fact Sheet for Patients:  PinkCheek.be Fact Sheet for Healthcare Providers:   GravelBags.it This test is not yet ap proved or cleared by the [place] FDA and  has been authorized for detection and/or diagnosis of SARS-CoV-2 by FDA under an Emergency Use Authorization (EUA). This EUA will remain  in effect (meaning this test can be used) for the duration of the COVID-19 declaration under Section 564(b)(1) of the Act, 21 U.S.C. section 360bbb-3(b)(1), unless the authorization is terminated or revoked sooner.    Influenza A by PCR NEGATIVE NEGATIVE Final   Influenza B by PCR NEGATIVE NEGATIVE Final    Comment: (NOTE) The Xpert Xpress SARS-CoV-2/FLU/RSV assay is intended as an aid in  the diagnosis of influenza from Nasopharyngeal swab specimens and  should not be used as a sole basis for treatment. Nasal washings and  aspirates are unacceptable for Xpert Xpress SARS-CoV-2/FLU/RSV  testing. Fact Sheet for Patients: PinkCheek.be Fact Sheet for Healthcare Providers: GravelBags.it This test is not yet approved or cleared by the [place] FDA and  has been authorized for detection and/or diagnosis of SARS-CoV-2 by  FDA under an Emergency Use Authorization (EUA). This EUA will remain  in effect (meaning this test can be used) for the duration of the  Covid-19 declaration under Section 564(b)(1) of the Act, 21  U.S.C. section 360bbb-3(b)(1), unless the authorization is  terminated or revoked. Performed at [facility], [place] [address], [place], [place] [address]   Gram stain     Status: None   Collection Time: 08/14/19  1:16 PM   Specimen: Abdomen; Peritoneal Fluid  Result Value Ref Range Status   Specimen Description PERITONEAL FLUID  Final   Special Requests ABDOMEN  Final   Gram Stain   Final    WBC PRESENT, PREDOMINANTLY MONONUCLEAR NO ORGANISMS SEEN CYTOSPIN SMEAR Performed at [facility], [place] [address], [place], [place] [address]    Report Status  08/14/2019 FINAL  Final  Culture, body fluid-bottle     Status: None (Preliminary result)   Collection Time: 08/14/19  1:16 PM   Specimen: Peritoneal Washings  Result Value Ref Range Status   Specimen Description PERITONEAL FLUID  Final   Special Requests ABDOMEN  Final   Culture   Final    NO GROWTH < 24 HOURS Performed at [facility], [place] [address], [place], [place] [address]  Report Status PENDING  Incomplete     Time coordinating discharge: 25  minutes  SIGNED: Antonieta Pert, MD  [facility] 08/15/2019, 10:08 AM  If 7PM-7AM, please contact night-coverage [email or web address]

## 2019-08-15 NOTE — Progress Notes (Signed)
Called phlebotomy regarding morning lab draws  Phlebotomy tech stated she is on her way  MD aware  Will continue to monitor

## 2019-08-17 ENCOUNTER — Other Ambulatory Visit: Payer: Self-pay | Admitting: *Deleted

## 2019-08-17 ENCOUNTER — Encounter: Payer: Self-pay | Admitting: *Deleted

## 2019-08-17 LAB — SURGICAL PATHOLOGY

## 2019-08-17 LAB — CYTOLOGY - NON PAP

## 2019-08-17 NOTE — Patient Outreach (Signed)
[facility]) Care Management [place] Telephone Outreach, new referral PCP office completes Transition of Care outreach post-hospital discharge Post (most recent) hospital discharge day # 2 Unsuccessful (consecutive) outreach attempt # 1  08/17/2019  Yer Olivencia [birth year] [id number]  Unsuccessful telephone outreach to [facility]" [place], 49 y/o male referred to [facility] RN CM 08/06/19 by [facility] Liaison after two recent hospitalizations; patient was initially admitted to hospital April 13-18, 2021with drug induced hepatic toxicity after elective colonoscopy on July 20, 2019 for recent clonic mass; he was discharged home to self care without home health services in place. Unfortunately, patient experienced second hospitalization April 21-29, 2021with jaundice, lower extremity swelling, confusion and was diagnosed with acute liver failure. He was discharged home to self-care without home health services in place; patient has follow up scheduled with hepatic clinic in [place], [place] on Aug 26, 2019.  Unfortunately, patient experience recent hospital re-admission May 5-8, 2021 for ongoing rectal bleeding; patient had colonoscopy which revealed hemorrhoids and was again discharged home to self care without home health services in place.  Patient has history including, but not limited to, HTN/ HLD; obesity; alcohol use/ abuse; GERD; anxiety/ depression; and colonic mass.  HIPAA compliant voice mail message left for patient, requesting return call back.  Plan:  Verified [facility] unsuccessful patient outreach letter placed in mail August 07, 2019, requesting call back in writing  Will re-attempt [place] telephone outreach within 4 business days if I do not hear back from patient first.  Oneta Rack, RN, BSN, [place] Coordinator [facility] Care Management  [phone]

## 2019-08-19 ENCOUNTER — Ambulatory Visit: Payer: 59 | Admitting: Nurse Practitioner

## 2019-08-19 ENCOUNTER — Encounter: Payer: Self-pay | Admitting: Gastroenterology

## 2019-08-19 LAB — CULTURE, BODY FLUID W GRAM STAIN -BOTTLE: Culture: NO GROWTH

## 2019-08-20 ENCOUNTER — Ambulatory Visit (INDEPENDENT_AMBULATORY_CARE_PROVIDER_SITE_OTHER): Payer: 59 | Admitting: Family Medicine

## 2019-08-20 ENCOUNTER — Other Ambulatory Visit: Payer: Self-pay

## 2019-08-20 ENCOUNTER — Other Ambulatory Visit: Payer: Self-pay | Admitting: *Deleted

## 2019-08-20 ENCOUNTER — Ambulatory Visit: Payer: 59 | Admitting: Nurse Practitioner

## 2019-08-20 ENCOUNTER — Encounter: Payer: Self-pay | Admitting: Family Medicine

## 2019-08-20 VITALS — BP 126/82 | HR 88 | Temp 98.0°F | Resp 16 | Ht 69.0 in | Wt 178.0 lb

## 2019-08-20 DIAGNOSIS — K644 Residual hemorrhoidal skin tags: Secondary | ICD-10-CM | POA: Diagnosis not present

## 2019-08-20 DIAGNOSIS — B179 Acute viral hepatitis, unspecified: Secondary | ICD-10-CM

## 2019-08-20 DIAGNOSIS — D638 Anemia in other chronic diseases classified elsewhere: Secondary | ICD-10-CM

## 2019-08-20 DIAGNOSIS — K648 Other hemorrhoids: Secondary | ICD-10-CM

## 2019-08-20 DIAGNOSIS — R17 Unspecified jaundice: Secondary | ICD-10-CM | POA: Diagnosis not present

## 2019-08-20 DIAGNOSIS — R1011 Right upper quadrant pain: Secondary | ICD-10-CM

## 2019-08-20 DIAGNOSIS — R69 Illness, unspecified: Secondary | ICD-10-CM | POA: Diagnosis not present

## 2019-08-20 MED ORDER — HYDROCORTISONE 2.5 % EX CREA: TOPICAL_CREAM | CUTANEOUS | 0 refills | Status: DC

## 2019-08-20 MED ORDER — OXYCODONE HCL 5 MG PO CAPS: 5.0000 mg | ORAL_CAPSULE | Freq: Four times a day (QID) | ORAL | 0 refills | Status: DC | PRN

## 2019-08-20 NOTE — Assessment & Plan Note (Signed)
Due to ascites, and liver inflammation Refilled oxycodone given 20tabs

## 2019-08-20 NOTE — Assessment & Plan Note (Signed)
Add fiberone to diet Continue suppository Recheck CBC

## 2019-08-20 NOTE — Assessment & Plan Note (Signed)
Improved bilirubin level, down to  13 at discharge Reviewed hospital notes, labs, imaging in detail with pt Continue steroids, diuretics, improved peripheral edema and ascites F/u [facility] liver clinic Monday

## 2019-08-20 NOTE — Telephone Encounter (Signed)
Received call from patient.   Reports that Anusol is not covered. Out of pocket cost is $80.  Changes to Hydrocortisone 2.5% cream.

## 2019-08-20 NOTE — Assessment & Plan Note (Signed)
Recheck CBC, energy levels are improving

## 2019-08-20 NOTE — Progress Notes (Signed)
Subjective:    Patient ID: Christopher Carrillo, male    DOB: [birth year], 49 y.o.   MRN: [id number]  Patient presents for Hospital F/U Patient here for hospital follow-up.  He was readmitted to the hospital secondary to rectal bleeding.  He had colonoscopy performed which showed internal and external hemorrhoids he also had bleeding from her previous biopsy spot where he still had partial polyp that they removed in entirety.  He continues to have some bleeding after his bowel movements.  He has been using Anusol suppositories and these have helped.  He has not started the extra fiber that they recommended.    Hepatitis his bilirubin levels continue to trend down.  His weight is down 11 pounds since our visit 2 weeks ago.  He had paracentesis done in the hospital where he had fluid removed the culture however was negative and never malignant cells.  He is still on high-dose methylprednisolone and is scheduled [facility] Monday the 17th.  He is due for repeat metabolic panel and CBC today per discharge instructions.  He is still taking Lasix and Aldactone for blood pressure was a little soft in the hospital but has rebounded back up.  Secondary to mild acute bradycardia not able to take it shower and and she not take any of her medicine  Jaundice this is actually improving he still has significant jaundice in his face and scleral icterus but the jaundice on his trunk and lower extremities is actually improving.  His appetite is also improving he does not feel less confused.  He is still taking lactulose but now using it as needed he has multiple bowel movements throughout the day he does not take the lactulose.  He does request a refill on his oxycodone.  No new problems since he left the hospital.  Note he is still scheduled to see the urologist in June because of obstructive bladder issues he is on Flomax and this has been working well for urination.    Review Of  Systems:  GEN- denies fatigue, fever, weight loss,weakness, recent illness HEENT- denies eye drainage, change in vision, nasal discharge, CVS- denies chest pain, palpitations RESP- denies SOB, cough, wheeze ABD- denies N/V, change in stools, +abd pain GU- denies dysuria, hematuria, dribbling, incontinence MSK- denies joint pain, muscle aches, injury Neuro- denies headache, dizziness, syncope, seizure activity       Objective:    BP 126/82   Pulse 88   Temp 98 F (36.7 C) (Temporal)   Resp 16   Ht 5' 9"  (1.753 m)   Wt 178 lb (80.7 kg)   BMI 26.29 kg/m  GEN- NAD, alert and oriented x3, fatigued appearing HEENT- PERRL, EOMI, icteric sclera, pink conjunctiva, MMM, oropharynx clear Neck- Supple, no LAD CVS- RRR, no murmur RESP-CTAB Skin- improved generalized jaundice with scleral icterusm, mild skin peeling in hands  ABD-NABS,soft, Mild TTP across upper abdomen, decreased distension  EXT- no edema Pulses- Radial 2+, DP diminished         Assessment & Plan:      Problem List Items Addressed This Visit      Unprioritized   Acute hepatitis - Primary    Improved bilirubin level, down to  13 at discharge Reviewed hospital notes, labs, imaging in detail with pt Continue steroids, diuretics, improved peripheral edema and ascites F/u [facility] liver clinic Monday       Relevant Orders   CBC with Differential/Platelet   Comprehensive metabolic panel  Anemia of chronic disease    Recheck CBC, energy levels are improving       Elevated bilirubin   Internal and external bleeding hemorrhoids    Add fiberone to diet Continue suppository Recheck CBC      RUQ pain    Due to ascites, and liver inflammation Refilled oxycodone given 20tabs          Note: This dictation was prepared with Dragon dictation along with smaller phrase technology. Any transcriptional errors that result from this process are unintentional.

## 2019-08-20 NOTE — Patient Instructions (Signed)
F/U 2 months

## 2019-08-21 ENCOUNTER — Other Ambulatory Visit: Payer: Self-pay | Admitting: *Deleted

## 2019-08-21 ENCOUNTER — Encounter: Payer: Self-pay | Admitting: *Deleted

## 2019-08-21 LAB — COMPREHENSIVE METABOLIC PANEL
AG Ratio: 1.6 (calc) (ref 1.0–2.5)
ALT: 77 U/L — ABNORMAL HIGH (ref 9–46)
AST: 95 U/L — ABNORMAL HIGH (ref 10–40)
Albumin: 3.3 g/dL — ABNORMAL LOW (ref 3.6–5.1)
Alkaline phosphatase (APISO): 166 U/L — ABNORMAL HIGH (ref 36–130)
BUN: 16 mg/dL (ref 7–25)
CO2: 24 mmol/L (ref 20–32)
Calcium: 9 mg/dL (ref 8.6–10.3)
Chloride: 103 mmol/L (ref 98–110)
Creat: 0.66 mg/dL (ref 0.60–1.35)
Globulin: 2.1 g/dL (calc) (ref 1.9–3.7)
Glucose, Bld: 88 mg/dL (ref 65–99)
Potassium: 3.7 mmol/L (ref 3.5–5.3)
Sodium: 136 mmol/L (ref 135–146)
Total Bilirubin: 8.5 mg/dL — ABNORMAL HIGH (ref 0.2–1.2)
Total Protein: 5.4 g/dL — ABNORMAL LOW (ref 6.1–8.1)

## 2019-08-21 LAB — CBC WITH DIFFERENTIAL/PLATELET
Absolute Monocytes: 1954 cells/uL — ABNORMAL HIGH (ref 200–950)
Basophils Absolute: 22 cells/uL (ref 0–200)
Basophils Relative: 0.1 %
Eosinophils Absolute: 44 cells/uL (ref 15–500)
Eosinophils Relative: 0.2 %
HCT: 32.5 % — ABNORMAL LOW (ref 38.5–50.0)
Hemoglobin: 11.7 g/dL — ABNORMAL LOW (ref 13.2–17.1)
Lymphs Abs: 1443 cells/uL (ref 850–3900)
MCH: 35.9 pg — ABNORMAL HIGH (ref 27.0–33.0)
MCHC: 36 g/dL (ref 32.0–36.0)
MCV: 99.7 fL (ref 80.0–100.0)
MPV: 9.9 fL (ref 7.5–12.5)
Monocytes Relative: 8.8 %
Neutro Abs: 18737 cells/uL — ABNORMAL HIGH (ref 1500–7800)
Neutrophils Relative %: 84.4 %
Platelets: 410 10*3/uL — ABNORMAL HIGH (ref 140–400)
RBC: 3.26 10*6/uL — ABNORMAL LOW (ref 4.20–5.80)
RDW: 13.7 % (ref 11.0–15.0)
Total Lymphocyte: 6.5 %
WBC: 22.2 10*3/uL — ABNORMAL HIGH (ref 3.8–10.8)

## 2019-08-21 NOTE — Patient Outreach (Signed)
[facility]) Care Management [place] Telephone Outreach, patient followed by external CM program PCP office completes Transition of Care follow up post-hospital discharge 08/21/2019  Christopher Carrillo 02/19/1971 [id number]  11:20 am:  Unsuccessful telephone outreach to [facility] [place], 49 y/o male referred to [facility] RN CM 08/06/19 by [facility] Liaison after two recent hospitalizations; patient was initially admitted to hospital April 13-18, 2021with drug induced hepatic toxicity after elective colonoscopy on July 20, 2019 for recent clonic mass; he was discharged home to self care without home health services in place. Unfortunately, patient experienced second hospitalization April 21-29, 2021with jaundice, lower extremity swelling, confusion and was diagnosed with acute liver failure. He was discharged home to self-care without home health services in place; patient has follow up scheduled with hepatic clinic in [place], [place] on Aug 26, 2019.  Unfortunately, patient experience recent hospital re-admission May 5-8, 2021 for ongoing rectal bleeding; patient had colonoscopy which revealed hemorrhoids and was again discharged home to self care without home health services in place.  Patient has history including, but not limited to, HTN/ HLD; obesity; alcohol use/ abuse; GERD; anxiety/ depression; and colonic mass.  With today's call attempt, I was unable to leave patient HIPAA compliant voice mail requesting call back, as his phone rang without physical or voice mail pick up.  1:35 pm: Re-attempted call to patient on alternate phone number; successfully connected with patient; HIPAA/ identity verified and purpose of call provided as reminder to patient; patient reports he has received so much mail lately, he thinks he received my initial letter, but is not sure; re-explained [facility] CM services to patient, who states that he is currently followed by RN CM  through his health insurance provider; reports he and his wife both talk to insurance RN CM "frequently;" patient reports having multiple doctor's office visits and declines additional care coordination/ care management needs at this time, stating "plently of help."    Explained to patient that we could re-initiate referral should he change his mind and wish to participate, or should he no longer be followed by RN CM associated with his health insurance provider; encouraged patient to contact me directly or the [facility] CM office if he wishes to participate in [facility] CM program and he is agreeable.  Provided my direct contact and [facility] CM office contact information to patient.  Plan:  Verified [place] unsuccessful patient outreach letter placed in mail August 07, 2019, requesting call back in writing  Will make patient inactive with [facility] CM, as he reports he is currently followed by external care management program and declines participation today, and will make patient's PCP aware of same.  Oneta Rack, RN, BSN, [employer] [facility] Care Management  [phone]

## 2019-08-24 DIAGNOSIS — K7011 Alcoholic hepatitis with ascites: Secondary | ICD-10-CM | POA: Diagnosis not present

## 2019-08-24 DIAGNOSIS — K7030 Alcoholic cirrhosis of liver without ascites: Secondary | ICD-10-CM | POA: Diagnosis not present

## 2019-08-24 DIAGNOSIS — R188 Other ascites: Secondary | ICD-10-CM | POA: Diagnosis not present

## 2019-08-24 DIAGNOSIS — G9349 Other encephalopathy: Secondary | ICD-10-CM | POA: Diagnosis not present

## 2019-08-24 DIAGNOSIS — R69 Illness, unspecified: Secondary | ICD-10-CM | POA: Diagnosis not present

## 2019-09-01 ENCOUNTER — Other Ambulatory Visit: Payer: Self-pay | Admitting: *Deleted

## 2019-09-01 MED ORDER — OXYCODONE HCL 5 MG PO CAPS: 5.0000 mg | ORAL_CAPSULE | Freq: Four times a day (QID) | ORAL | 0 refills | Status: DC | PRN

## 2019-09-01 NOTE — Telephone Encounter (Signed)
Received call from patient.   States that since liver labs are improving (ALK improved, bilirubin improved to 6 from 8) he has been more active. States that he is having increased pain his abd.  Ok to refill??  Last office visit/  refill 08/20/2019, #20 tabs.

## 2019-09-04 ENCOUNTER — Telehealth: Payer: Self-pay | Admitting: *Deleted

## 2019-09-04 NOTE — Telephone Encounter (Signed)
Call placed to patient and patient made aware.   Patient states that he would like to return to work on Monday 09/07/2019. Advised that legally, he has to be seen in office and cleared to return to work prior to that.   Appointment scheduled.

## 2019-09-04 NOTE — Telephone Encounter (Signed)
Received call from patient with multiple concerns:  1. Reports that he was seen at liver clinic specialty and was advised that next F/U visit Genevie Elman be in 3 months. States that he is concerned bilirubin may increase during that time and requested PCP to re- check labs prior to 3 month F/U with specialist.   2. States that specialist did not feel that his case was severe enough to warrant transplant at this time. Reports that specialist stated he could return to work. Inquired as to PCP thoughts on returning to work.   MD please advise.

## 2019-09-04 NOTE — Telephone Encounter (Signed)
He is already scheduled to be out of work until July 1st I believe, that is what I put on forms  There is not a protocal for checking his labs unless specialist recommended  He can schedule a visit end of June before he returns to work, I can check labs then

## 2019-09-08 ENCOUNTER — Encounter: Payer: Self-pay | Admitting: Family Medicine

## 2019-09-08 ENCOUNTER — Ambulatory Visit (INDEPENDENT_AMBULATORY_CARE_PROVIDER_SITE_OTHER): Payer: 59 | Admitting: Family Medicine

## 2019-09-08 ENCOUNTER — Other Ambulatory Visit: Payer: Self-pay

## 2019-09-08 VITALS — BP 122/68 | HR 98 | Temp 98.2°F | Resp 14 | Ht 69.0 in | Wt 177.0 lb

## 2019-09-08 DIAGNOSIS — R17 Unspecified jaundice: Secondary | ICD-10-CM | POA: Diagnosis not present

## 2019-09-08 DIAGNOSIS — B179 Acute viral hepatitis, unspecified: Secondary | ICD-10-CM

## 2019-09-08 DIAGNOSIS — D638 Anemia in other chronic diseases classified elsewhere: Secondary | ICD-10-CM | POA: Diagnosis not present

## 2019-09-08 DIAGNOSIS — R69 Illness, unspecified: Secondary | ICD-10-CM | POA: Diagnosis not present

## 2019-09-08 MED ORDER — SPIRONOLACTONE 50 MG PO TABS: 50.0000 mg | ORAL_TABLET | Freq: Every day | ORAL | 2 refills | Status: DC

## 2019-09-08 MED ORDER — FUROSEMIDE 20 MG PO TABS: 20.0000 mg | ORAL_TABLET | Freq: Every day | ORAL | 2 refills | Status: DC

## 2019-09-08 MED ORDER — TAMSULOSIN HCL 0.4 MG PO CAPS: 0.4000 mg | ORAL_CAPSULE | Freq: Every day | ORAL | 2 refills | Status: DC

## 2019-09-08 NOTE — Patient Instructions (Addendum)
F/u 1 month for labs visit  F/U 3 months   For the steroids, take 24m once a day for a week, then 187mfor a week  Then stop Unless you find other instructions

## 2019-09-08 NOTE — Progress Notes (Signed)
Subjective:    Patient ID: Christopher Carrillo, male    DOB: 1970-06-07, 49 y.o.   MRN: [id number]  Patient presents for Return to Work  Pt here to discuss return to work.  He states that his energy levels are much improved he is able to walk 2 miles per day for exercise.  He is off his home without getting short of breath.  He is liver labs have improved significantly his hepatologist wants to see him back in 3 months.  He has been tapered off of the methylprednisolone although he cannot remember the actual taper they gave him but he is currently on 20 mg this week.  He thought he was supposed to stop after 20 mg [place].  He denies any chest pain or shortness of breath.  He still gets some right upper quadrant pain which is intermittent he will take oxycodone sometimes in the evening.  He is urinating normally with Flomax.  He was not sure if he supposed to continue for spironolactone and Lasix states that they not give him any direction on those medications and he needs refills. He was on short-term disability he states that ran out last week but he did not convert for long-term disability as he wants to return to work.  His appetite has improved.    He wants to return to work, he is going to be traveling between [place]:  GEN- denies fatigue, fever, weight loss,weakness, recent illness HEENT- denies eye drainage, change in vision, nasal discharge, CVS- denies chest pain, palpitations RESP- denies SOB, cough, wheeze ABD- denies N/V, change in stools, abd pain GU- denies dysuria, hematuria, dribbling, incontinence MSK- denies joint pain, muscle aches, injury Neuro- denies headache, dizziness, syncope, seizure activity       Objective:    BP 122/68   Pulse 98   Temp 98.2 F (36.8 C) (Temporal)   Resp 14   Ht 5' 9"  (1.753 m)   Wt 177 lb (80.3 kg)   SpO2 100%   BMI 26.14 kg/m  GEN- NAD, alert and oriented x3 HEENT- PERRL, EOMI, mild sclera icterus and  icterus of lids , pink conjunctiva, MMM, oropharynx clear Neck- Supple, no LAD CVS- RRR, no murmur RESP-CTAB ABD-NABS, mild distension, mild TTP RUQ, no rebound, no guarding  EXT- No edema Pulses- Radial 2+        Assessment & Plan:      Problem List Items Addressed This Visit      Unprioritized   Acute hepatitis - Primary    Hepatitis with hyperbilirubinemia leading to generalized jaundice is much improved Per pt , based on his discussion with liver specialist this was due to alcohol intake  He has been off ETOH for  > 3 months now Advised patient with regards to his methylprednisolone if he does not have any documentation at home his liver doctor needs to take prednisone 20 mg for a week then 10 mg for a week then discontinue. He is going to follow-up in 1 month for a lab visit I will obtain the note from his pathologist.  I have refilled the Lasix and spironolactone.  On his current endurance and overall he is feeling much better he is going to attempt to go back to work.  We will release him back to work this Thursday if he has any complications he will let me know      Relevant Orders   Comprehensive metabolic panel   CBC with Differential/Platelet  Anemia of chronic disease   Relevant Orders   Comprehensive metabolic panel   CBC with Differential/Platelet   Elevated bilirubin   Relevant Orders   Comprehensive metabolic panel   CBC with Differential/Platelet      Note: This dictation was prepared with Dragon dictation along with smaller phrase technology. Any transcriptional errors that result from this process are unintentional.

## 2019-09-08 NOTE — Assessment & Plan Note (Addendum)
Hepatitis with hyperbilirubinemia leading to generalized jaundice is much improved Per pt , based on his discussion with liver specialist this was due to alcohol intake  He has been off ETOH for  > 3 months now Advised patient with regards to his methylprednisolone if he does not have any documentation at home his liver doctor needs to take prednisone 20 mg for a week then 10 mg for a week then discontinue. He is going to follow-up in 1 month for a lab visit I will obtain the note from his pathologist.  I have refilled the Lasix and spironolactone.  On his current endurance and overall he is feeling much better he is going to attempt to go back to work.  We will release him back to work this Thursday if he has any complications he will let me know

## 2019-09-15 ENCOUNTER — Ambulatory Visit: Payer: 59 | Admitting: Urology

## 2019-09-23 ENCOUNTER — Ambulatory Visit: Payer: 59 | Admitting: Urology

## 2019-09-23 NOTE — Telephone Encounter (Signed)
Opened in error

## 2019-09-24 ENCOUNTER — Encounter: Payer: Self-pay | Admitting: Family Medicine

## 2019-10-06 ENCOUNTER — Telehealth: Payer: Self-pay | Admitting: *Deleted

## 2019-10-06 NOTE — Telephone Encounter (Signed)
Received disability forms from Christopher Carrillo.   Call placed to patient to inquire as patient was seen on 09/08/2019 for return to work.

## 2019-10-07 NOTE — Telephone Encounter (Signed)
Received phone call from patient.   Reports that he has not requested any further disability benefits at this time and has already returned to work.   Patient called HR to inquire. Was advised that this form was for Long Term Disability and was immediately started without looking to return to work on [place].   Please disregard.

## 2019-10-08 ENCOUNTER — Other Ambulatory Visit: Payer: 59

## 2019-10-08 ENCOUNTER — Other Ambulatory Visit: Payer: Self-pay

## 2019-10-08 DIAGNOSIS — B179 Acute viral hepatitis, unspecified: Secondary | ICD-10-CM

## 2019-10-08 DIAGNOSIS — R69 Illness, unspecified: Secondary | ICD-10-CM | POA: Diagnosis not present

## 2019-10-08 DIAGNOSIS — D638 Anemia in other chronic diseases classified elsewhere: Secondary | ICD-10-CM

## 2019-10-08 DIAGNOSIS — R17 Unspecified jaundice: Secondary | ICD-10-CM

## 2019-10-09 LAB — CBC WITH DIFFERENTIAL/PLATELET
Absolute Monocytes: 1233 cells/uL — ABNORMAL HIGH (ref 200–950)
Basophils Absolute: 60 cells/uL (ref 0–200)
Basophils Relative: 0.7 %
Eosinophils Absolute: 102 cells/uL (ref 15–500)
Eosinophils Relative: 1.2 %
HCT: 41.1 % (ref 38.5–50.0)
Hemoglobin: 14.3 g/dL (ref 13.2–17.1)
Lymphs Abs: 2015 cells/uL (ref 850–3900)
MCH: 33.3 pg — ABNORMAL HIGH (ref 27.0–33.0)
MCHC: 34.8 g/dL (ref 32.0–36.0)
MCV: 95.6 fL (ref 80.0–100.0)
MPV: 10 fL (ref 7.5–12.5)
Monocytes Relative: 14.5 %
Neutro Abs: 5092 cells/uL (ref 1500–7800)
Neutrophils Relative %: 59.9 %
Platelets: 301 10*3/uL (ref 140–400)
RBC: 4.3 10*6/uL (ref 4.20–5.80)
RDW: 12.1 % (ref 11.0–15.0)
Total Lymphocyte: 23.7 %
WBC: 8.5 10*3/uL (ref 3.8–10.8)

## 2019-10-09 LAB — COMPREHENSIVE METABOLIC PANEL
AG Ratio: 1.3 (calc) (ref 1.0–2.5)
ALT: 14 U/L (ref 9–46)
AST: 24 U/L (ref 10–40)
Albumin: 3.5 g/dL — ABNORMAL LOW (ref 3.6–5.1)
Alkaline phosphatase (APISO): 120 U/L (ref 36–130)
BUN/Creatinine Ratio: 10 (calc) (ref 6–22)
BUN: 6 mg/dL — ABNORMAL LOW (ref 7–25)
CO2: 21 mmol/L (ref 20–32)
Calcium: 8.6 mg/dL (ref 8.6–10.3)
Chloride: 102 mmol/L (ref 98–110)
Creat: 0.61 mg/dL (ref 0.60–1.35)
Globulin: 2.8 g/dL (calc) (ref 1.9–3.7)
Glucose, Bld: 152 mg/dL — ABNORMAL HIGH (ref 65–99)
Potassium: 3.4 mmol/L — ABNORMAL LOW (ref 3.5–5.3)
Sodium: 135 mmol/L (ref 135–146)
Total Bilirubin: 1 mg/dL (ref 0.2–1.2)
Total Protein: 6.3 g/dL (ref 6.1–8.1)

## 2019-10-20 ENCOUNTER — Ambulatory Visit: Payer: 59 | Admitting: Family Medicine

## 2019-10-26 ENCOUNTER — Telehealth: Payer: Self-pay | Admitting: *Deleted

## 2019-10-26 ENCOUNTER — Ambulatory Visit: Payer: 59 | Admitting: Nurse Practitioner

## 2019-10-26 ENCOUNTER — Other Ambulatory Visit: Payer: Self-pay

## 2019-10-26 VITALS — BP 132/74 | HR 92 | Temp 98.6°F | Resp 18 | Wt 188.8 lb

## 2019-10-26 DIAGNOSIS — E876 Hypokalemia: Secondary | ICD-10-CM | POA: Diagnosis not present

## 2019-10-26 DIAGNOSIS — Z8719 Personal history of other diseases of the digestive system: Secondary | ICD-10-CM | POA: Diagnosis not present

## 2019-10-26 DIAGNOSIS — R7309 Other abnormal glucose: Secondary | ICD-10-CM

## 2019-10-26 DIAGNOSIS — R110 Nausea: Secondary | ICD-10-CM | POA: Diagnosis not present

## 2019-10-26 MED ORDER — ONDANSETRON HCL 4 MG PO TABS: 4.0000 mg | ORAL_TABLET | Freq: Four times a day (QID) | ORAL | 0 refills | Status: DC

## 2019-10-26 NOTE — Progress Notes (Signed)
Established Patient Office Visit  Subjective:  Patient ID: Christopher Carrillo, male    DOB: 1971-01-04  Age: 49 y.o. MRN: [id number]  CC:  Chief Complaint  Patient presents with  . GI Problem    thinks it is a K problem, fatigue, headache, bloating    HPI Christopher Carrillo is a 49 year old male presenting to the clinic to discuss his conditions of the liver and abdomen. He reports that he has had stomach pain, abnormal liver test, test completed and was not informed of the results. He wanted to have specifically his cscope that was completed 08/2019 reviewed for abnormal findings. The report was reviewed with pt of diverticulosis and polyp of the colon. The pt denied having abdominal pain, bloating, increased abdominal girth, abnormal stool, blood in stool, vomiting, change in appetite, pain gu sxs. He does have nausea that he reports he has had for a over a year. Discussed refilling Zofran today and if this sxs becomes worse, new sxs, or non resolving to seek medical attention. The pt v/l an understanding of the cscope diagnosis and red flags of conditions r/t the diagnosis such as diverticulitis.   Pt reports that his potassium and blood sugar was elevated on his labs last and was wondering if could be checked today. Will plan on blood draw. No polyuria, polydipsia, sob, breath odor, dry mouth, muscle cramping.  Past Medical History:  Diagnosis Date  . Abdominal pain   . Alcoholic hepatitis without ascites   . Anemia   . Elevated bilirubin   . Essential hypertension   . Psoriasis   . Transaminitis     Past Surgical History:  Procedure Laterality Date  . BIOPSY  07/20/2019   Procedure: BIOPSY;  Surgeon: Daneil Dolin, MD;  [facility];  Service: Endoscopy;;  . BIOPSY  08/14/2019   Procedure: BIOPSY;  Surgeon: Thornton Park, MD;  Location: [place];  Service: Gastroenterology;;  . COLONOSCOPY WITH PROPOFOL N/A 07/20/2019   Procedure: COLONOSCOPY WITH PROPOFOL;   Surgeon: Daneil Dolin, MD;  [facility];  Service: Endoscopy;  Laterality: N/A;  10:15am  . COLONOSCOPY WITH PROPOFOL N/A 08/14/2019   Procedure: COLONOSCOPY WITH PROPOFOL;  Surgeon: Thornton Park, MD;  Location: [place];  Service: Gastroenterology;  Laterality: N/A;  . IR PARACENTESIS  08/14/2019  . NO PAST SURGERIES    . POLYPECTOMY  07/20/2019   Procedure: POLYPECTOMY;  Surgeon: Daneil Dolin, MD;  [facility];  Service: Endoscopy;;  . SUBMUCOSAL TATTOO INJECTION  08/14/2019   Procedure: SUBMUCOSAL TATTOO INJECTION;  Surgeon: Thornton Park, MD;  Location: [facility];  Service: Gastroenterology;;    Family History  Problem Relation Age of Onset  . Asthma Father   . Cancer Sister 18       Pharyngeal  . Colon cancer Neg Hx   . Liver disease Neg Hx     Social History   Socioeconomic History  . Marital status: Married    Spouse name: Not on file  . Number of children: Not on file  . Years of education: Not on file  . Highest education level: Not on file  Occupational History  . Not on file  Tobacco Use  . Smoking status: Current Every Day Smoker    Packs/day: 0.33    Types: Cigarettes  . Smokeless tobacco: Never Used  Vaping Use  . Vaping Use: Never used  Substance and Sexual Activity  . Alcohol use: Not Currently  Comment: 3-4 beers/night  . Drug use: No  . Sexual activity: Yes  Other Topics Concern  . Not on file  Social History Narrative  . Not on file   Social Determinants of Health   Financial Resource Strain:   . Difficulty of Paying Living Expenses:   Food Insecurity:   . Worried About Charity fundraiser in the Last Year:   . Arboriculturist in the Last Year:   Transportation Needs:   . Film/video editor (Medical):   Marland Kitchen Lack of Transportation (Non-Medical):   Physical Activity:   . Days of Exercise per Week:   . Minutes of Exercise per Session:   Stress:   . Feeling of Stress :   Social Connections:   .  Frequency of Communication with Friends and Family:   . Frequency of Social Gatherings with Friends and Family:   . Attends Religious Services:   . Active Member of Clubs or Organizations:   . Attends Archivist Meetings:   Marland Kitchen Marital Status:   Intimate Partner Violence:   . Fear of Current or Ex-Partner:   . Emotionally Abused:   Marland Kitchen Physically Abused:   . Sexually Abused:     Outpatient Medications Prior to Visit  Medication Sig Dispense Refill  . folic acid (FOLVITE) 1 MG tablet Take 1 tablet (1 mg total) by mouth daily.    . furosemide (LASIX) 20 MG tablet Take 1 tablet (20 mg total) by mouth daily. 30 tablet 2  . hydrocortisone (ANUSOL-HC) 25 MG suppository Place 1 suppository (25 mg total) rectally 2 (two) times daily. 12 suppository 0  . hydrocortisone 2.5 % cream Apply pea sized amount rectally BID PRN- please dispense with rectal tip 30 g 0  . Hyprom-Naphaz-Polysorb-Zn Sulf (CLEAR EYES COMPLETE OP) Place 1 drop into both eyes daily as needed (for dry eyes).    . Multiple Vitamin (MULTIVITAMIN WITH MINERALS) TABS tablet Take 1 tablet by mouth daily.    Marland Kitchen oxycodone (OXY-IR) 5 MG capsule Take 1 capsule (5 mg total) by mouth every 6 (six) hours as needed. 20 capsule 0  . tamsulosin (FLOMAX) 0.4 MG CAPS capsule Take 1 capsule (0.4 mg total) by mouth daily after supper. 30 capsule 2  . spironolactone (ALDACTONE) 50 MG tablet Take 1 tablet (50 mg total) by mouth daily. 30 tablet 2   No facility-administered medications prior to visit.    No Known Allergies  ROS Review of Systems  All other systems reviewed and are negative.     Objective:    Physical Exam Vitals and nursing note reviewed.  Constitutional:      General: He is not in acute distress.    Appearance: Normal appearance. He is well-developed and well-groomed. He is not ill-appearing, toxic-appearing or diaphoretic.  HENT:     Head: Normocephalic.     Right Ear: External ear normal.     Left Ear:  External ear normal.  Eyes:     General: Lids are normal. Lids are everted, no foreign bodies appreciated.     Extraocular Movements: Extraocular movements intact.     Conjunctiva/sclera: Conjunctivae normal.     Pupils: Pupils are equal, round, and reactive to light.  Neck:     Thyroid: No thyromegaly.     Vascular: No JVD.  Cardiovascular:     Rate and Rhythm: Normal rate and regular rhythm.     Heart sounds: Normal heart sounds.  Pulmonary:     Effort: Pulmonary  effort is normal.     Breath sounds: Normal breath sounds.  Abdominal:     General: Abdomen is flat. Bowel sounds are normal. There is no distension.     Palpations: Abdomen is soft. There is no mass.     Tenderness: There is no abdominal tenderness. There is no right CVA tenderness, left CVA tenderness, guarding or rebound.     Hernia: No hernia is present.  Musculoskeletal:        General: No swelling. Normal range of motion.     Cervical back: Normal range of motion and neck supple.     Right lower leg: No edema.     Left lower leg: No edema.  Lymphadenopathy:     Cervical: No cervical adenopathy.  Skin:    General: Skin is warm and dry.     Capillary Refill: Capillary refill takes less than 2 seconds.     Coloration: Skin is not jaundiced or pale.     Findings: No bruising, erythema, lesion or rash.  Neurological:     General: No focal deficit present.     Mental Status: He is alert and oriented to person, place, and time.     GCS: GCS eye subscore is 4. GCS verbal subscore is 5. GCS motor subscore is 6.  Psychiatric:        Attention and Perception: Attention and perception normal.        Mood and Affect: Mood and affect normal.        Speech: Speech normal.        Behavior: Behavior normal. Behavior is cooperative.        Thought Content: Thought content normal.        Cognition and Memory: Cognition normal.        Judgment: Judgment normal.     BP 132/74 (BP Location: Left Arm, Patient Position:  Sitting, Cuff Size: Normal)   Pulse 92   Temp 98.6 F (37 C) (Temporal)   Resp 18   Wt 188 lb 12.8 oz (85.6 kg)   SpO2 96%   BMI 27.88 kg/m  Wt Readings from Last 3 Encounters:  10/26/19 188 lb 12.8 oz (85.6 kg)  09/08/19 177 lb (80.3 kg)  08/20/19 178 lb (80.7 kg)     Health Maintenance Due  Topic Date Due  . COVID-19 Vaccine (2 - Moderna 2-dose series) 07/24/2019    There are no preventive care reminders to display for this patient.  Lab Results  Component Value Date   TSH 1.45 08/05/2018   Lab Results  Component Value Date   WBC 8.5 10/08/2019   HGB 14.3 10/08/2019   HCT 41.1 10/08/2019   MCV 95.6 10/08/2019   PLT 301 10/08/2019   Lab Results  Component Value Date   NA 135 10/08/2019   K 3.6 10/26/2019   CO2 21 10/08/2019   GLUCOSE 152 (H) 10/08/2019   BUN 6 (L) 10/08/2019   CREATININE 0.61 10/08/2019   BILITOT 1.0 10/08/2019   ALKPHOS 110 08/15/2019   AST 24 10/08/2019   ALT 14 10/08/2019   PROT 6.3 10/08/2019   ALBUMIN 2.8 (L) 08/15/2019   CALCIUM 8.6 10/08/2019   ANIONGAP 9 08/15/2019   Lab Results  Component Value Date   CHOL 303 (H) 05/25/2019   Lab Results  Component Value Date   HDL 59 05/25/2019   Lab Results  Component Value Date   LDLCALC 219 (H) 05/25/2019   Lab Results  Component Value Date  TRIG 111 05/25/2019   Lab Results  Component Value Date   CHOLHDL 5.1 (H) 05/25/2019   Lab Results  Component Value Date   HGBA1C 4.9 10/26/2019      Assessment & Plan:   Problem List Items Addressed This Visit      Other   History of diverticulosis    Other Visit Diagnoses    Elevated glucose    -  Primary   Relevant Orders   Hemoglobin A1c (Completed)   Low blood potassium       Relevant Orders   Potassium (Completed)   Nausea       Relevant Medications   ondansetron (ZOFRAN) 4 MG tablet    print out for diverticulosis and nausea Zofran for nausea Liquid diet today advance as tolerated tomorrow Labs today   Meds  ordered this encounter  Medications  . ondansetron (ZOFRAN) 4 MG tablet    Sig: Take 1 tablet (4 mg total) by mouth every 6 (six) hours.    Dispense:  12 tablet    Refill:  0    Follow-up: Return if symptoms worsen or fail to improve.    Annie Main, FNP

## 2019-10-26 NOTE — Telephone Encounter (Signed)
Received call from patient.   Reports that he is concerned about his liver function at this time. Reports that he has been having many of the same Sx he noted to begin with when his liver function was out of range. States that he is having increased pressure in abd and a lot of gurgling in stomach.   Reports that he would like to hav labs drawn to check.   Appointment scheduled to F/U

## 2019-10-27 ENCOUNTER — Encounter: Payer: Self-pay | Admitting: Family Medicine

## 2019-10-27 LAB — POTASSIUM: Potassium: 3.6 mmol/L (ref 3.5–5.3)

## 2019-10-27 LAB — HEMOGLOBIN A1C
Hgb A1c MFr Bld: 4.9 % of total Hgb (ref <5.7)
Mean Plasma Glucose: 94 (calc)
eAG (mmol/L): 5.2 (calc)

## 2019-10-27 NOTE — Progress Notes (Signed)
K and A1C normal

## 2019-10-28 ENCOUNTER — Other Ambulatory Visit: Payer: Self-pay | Admitting: Nurse Practitioner

## 2019-10-28 DIAGNOSIS — Z8719 Personal history of other diseases of the digestive system: Secondary | ICD-10-CM | POA: Insufficient documentation

## 2019-10-28 DIAGNOSIS — K760 Fatty (change of) liver, not elsewhere classified: Secondary | ICD-10-CM

## 2019-10-28 NOTE — Patient Instructions (Signed)
print out for diverticulosis and nausea Zofran for nausea Liquid diet today advance as tolerated tomorrow Labs today

## 2019-11-02 ENCOUNTER — Other Ambulatory Visit: Payer: Self-pay | Admitting: Nurse Practitioner

## 2019-11-02 DIAGNOSIS — K760 Fatty (change of) liver, not elsewhere classified: Secondary | ICD-10-CM

## 2019-11-10 ENCOUNTER — Ambulatory Visit: Payer: Self-pay | Admitting: Urology

## 2019-11-24 ENCOUNTER — Other Ambulatory Visit: Payer: Self-pay | Admitting: Family Medicine

## 2019-11-28 ENCOUNTER — Other Ambulatory Visit: Payer: Self-pay | Admitting: Nurse Practitioner

## 2019-11-28 DIAGNOSIS — R110 Nausea: Secondary | ICD-10-CM

## 2019-11-30 NOTE — Telephone Encounter (Signed)
Requested Prescriptions   Pending Prescriptions Disp Refills  . ondansetron (ZOFRAN) 4 MG tablet [Pharmacy Med Name: ONDANSETRON 4MG TABLETS] 12 tablet 0    Sig: TAKE 1 TABLET(4 MG) BY MOUTH EVERY 6 HOURS     Last OV 10/26/2019   Last written 10/26/2019

## 2019-12-07 DIAGNOSIS — R69 Illness, unspecified: Secondary | ICD-10-CM | POA: Diagnosis not present

## 2019-12-07 DIAGNOSIS — R188 Other ascites: Secondary | ICD-10-CM | POA: Diagnosis not present

## 2019-12-07 DIAGNOSIS — G9349 Other encephalopathy: Secondary | ICD-10-CM | POA: Diagnosis not present

## 2019-12-09 ENCOUNTER — Ambulatory Visit: Payer: 59 | Admitting: Family Medicine

## 2019-12-11 ENCOUNTER — Encounter: Payer: Self-pay | Admitting: Family Medicine

## 2019-12-11 ENCOUNTER — Ambulatory Visit (INDEPENDENT_AMBULATORY_CARE_PROVIDER_SITE_OTHER): Payer: 59 | Admitting: Family Medicine

## 2019-12-11 ENCOUNTER — Other Ambulatory Visit: Payer: Self-pay

## 2019-12-11 VITALS — BP 122/84 | HR 86 | Temp 98.9°F | Resp 14 | Ht 69.0 in | Wt 191.0 lb

## 2019-12-11 DIAGNOSIS — K7030 Alcoholic cirrhosis of liver without ascites: Secondary | ICD-10-CM

## 2019-12-11 DIAGNOSIS — R69 Illness, unspecified: Secondary | ICD-10-CM | POA: Diagnosis not present

## 2019-12-11 DIAGNOSIS — M2550 Pain in unspecified joint: Secondary | ICD-10-CM | POA: Diagnosis not present

## 2019-12-11 DIAGNOSIS — R7689 Other specified abnormal immunological findings in serum: Secondary | ICD-10-CM

## 2019-12-11 DIAGNOSIS — D638 Anemia in other chronic diseases classified elsewhere: Secondary | ICD-10-CM | POA: Diagnosis not present

## 2019-12-11 DIAGNOSIS — I10 Essential (primary) hypertension: Secondary | ICD-10-CM

## 2019-12-11 DIAGNOSIS — G6289 Other specified polyneuropathies: Secondary | ICD-10-CM

## 2019-12-11 DIAGNOSIS — R768 Other specified abnormal immunological findings in serum: Secondary | ICD-10-CM

## 2019-12-11 DIAGNOSIS — Z23 Encounter for immunization: Secondary | ICD-10-CM | POA: Diagnosis not present

## 2019-12-11 DIAGNOSIS — L659 Nonscarring hair loss, unspecified: Secondary | ICD-10-CM

## 2019-12-11 MED ORDER — OXYCODONE HCL 5 MG PO TABS: 5.0000 mg | ORAL_TABLET | Freq: Four times a day (QID) | ORAL | 0 refills | Status: DC | PRN

## 2019-12-11 NOTE — Patient Instructions (Signed)
F/U 4 months Oxycodone for pain  We will call with lab results Hepatitis A/B vaccine given

## 2019-12-11 NOTE — Progress Notes (Signed)
Subjective:    Patient ID: Christopher Carrillo, male    DOB: 07/12/1970, 49 y.o.   MRN: [id number]  Patient presents for Follow-up (is fasting), Joint Pain (generalized pain in joint), and Hair Loss (hair loss over body)   Hair loss over the past few months- He noticed chest/abdomen area was falling out first  and now legs and arms are thinning, most hair on legs is gone   He get joint pain in the morning, he has pain in all the joints feels like feet are numb as well in the morning Joint pain does ease up during the day  No swelling of joints   He takes 21m of lactulose twice a day , this was increased by Hepatitis C clinic  He needs HepA/B vaccine He will be following up with his primary GI doctor, recommended he have EGD done to r/o varices      Review Of Systems:  GEN- denies fatigue, fever, weight loss,weakness, recent illness HEENT- denies eye drainage, change in vision, nasal discharge, CVS- denies chest pain, palpitations RESP- denies SOB, cough, wheeze ABD- denies N/V, change in stools, abd pain GU- denies dysuria, hematuria, dribbling, incontinence MSK- + joint pain, muscle aches, injury Neuro- denies headache, dizziness, syncope, seizure activity       Objective:    BP 122/84   Pulse 86   Temp 98.9 F (37.2 C) (Temporal)   Resp 14   Ht 5' 9"  (1.753 m)   Wt 191 lb (86.6 kg)   SpO2 96%   BMI 28.21 kg/m  GEN- NAD, alert and oriented x3 HEENT- PERRL, EOMI, non injected sclera, pink conjunctiva, MMM, oropharynx clear Neck- Supple, no thyromegaly CVS- RRR, no murmur RESP-CTAB ABD-NABS,soft,NT,ND Skin- very scarce hair near umbilicus, and on legs scattered, hair normal apperance on face, head,  Thinned hair on arms  MSK- good ROM upper and lower ext, no effusion noted, able to make fist  EXT- No edema Pulses- Radial, DP- 2+        Assessment & Plan:    Discussed abstaining from all ETOH   Problem List Items Addressed This Visit      Unprioritized    Alcoholic cirrhosis ([place]    Reviewed last hepatology note Given HEP A/B Vaccine today  Check labs  Unclear cause of the alopecia, TSH checkedand normal, so will refer to dermatology  He has joint pain as well, borderline elevated RF, no effusion on exam, given oxycodone IR for pain, avoiding both NSAIDSand hepatotoxic meds       Relevant Orders   CBC with Differential/Platelet (Completed)   Comprehensive metabolic panel (Completed)   Lipid panel (Completed)   Anemia of chronic disease   Relevant Orders   CBC with Differential/Platelet (Completed)   Essential hypertension    Controlled no changes       Relevant Orders   TSH (Completed)   T3, free (Completed)   T4, free (Completed)    Other Visit Diagnoses    Alopecia    -  Primary   Relevant Orders   TSH (Completed)   T3, free (Completed)   T4, free (Completed)   ANA   C-reactive protein (Completed)   Ambulatory referral to Dermatology   Arthralgia, unspecified joint       Relevant Orders   ANA   C-reactive protein (Completed)   Rheumatoid factor (Completed)   Sedimentation Rate (Completed)   Ambulatory referral to Rheumatology   Other polyneuropathy       Relevant Orders  Ambulatory referral to Rheumatology   Need for prophylactic vaccination against hepatitis A and hepatitis B       Relevant Orders   Hepatitis A hepatitis B combined vaccine IM (Completed)   Elevated rheumatoid factor       Relevant Orders   Ambulatory referral to Rheumatology      Note: This dictation was prepared with Dragon dictation along with smaller phrase technology. Any transcriptional errors that result from this process are unintentional.

## 2019-12-13 NOTE — Assessment & Plan Note (Signed)
Reviewed last hepatology note Given HEP A/B Vaccine today  Check labs  Unclear cause of the alopecia, TSH checkedand normal, so will refer to dermatology  He has joint pain as well, borderline elevated RF, no effusion on exam, given oxycodone IR for pain, avoiding both NSAIDSand hepatotoxic meds

## 2019-12-13 NOTE — Assessment & Plan Note (Signed)
Controlled no changes

## 2019-12-15 ENCOUNTER — Other Ambulatory Visit: Payer: Self-pay | Admitting: *Deleted

## 2019-12-15 DIAGNOSIS — D582 Other hemoglobinopathies: Secondary | ICD-10-CM

## 2019-12-15 LAB — LIPID PANEL
Cholesterol: 213 mg/dL — ABNORMAL HIGH (ref <200)
HDL: 31 mg/dL — ABNORMAL LOW (ref >=40)
LDL Cholesterol (Calc): 154 mg/dL (calc) — ABNORMAL HIGH
Non-HDL Cholesterol (Calc): 182 mg/dL (calc) — ABNORMAL HIGH (ref <130)
Total CHOL/HDL Ratio: 6.9 (calc) — ABNORMAL HIGH (ref <5.0)
Triglycerides: 146 mg/dL (ref <150)

## 2019-12-15 LAB — COMPREHENSIVE METABOLIC PANEL
AG Ratio: 1.2 (calc) (ref 1.0–2.5)
ALT: 17 U/L (ref 9–46)
AST: 29 U/L (ref 10–40)
Albumin: 4.1 g/dL (ref 3.6–5.1)
Alkaline phosphatase (APISO): 145 U/L — ABNORMAL HIGH (ref 36–130)
BUN/Creatinine Ratio: 7 (calc) (ref 6–22)
BUN: 6 mg/dL — ABNORMAL LOW (ref 7–25)
CO2: 22 mmol/L (ref 20–32)
Calcium: 9.9 mg/dL (ref 8.6–10.3)
Chloride: 101 mmol/L (ref 98–110)
Creat: 0.81 mg/dL (ref 0.60–1.35)
Globulin: 3.3 g/dL (calc) (ref 1.9–3.7)
Glucose, Bld: 133 mg/dL — ABNORMAL HIGH (ref 65–99)
Potassium: 4.3 mmol/L (ref 3.5–5.3)
Sodium: 137 mmol/L (ref 135–146)
Total Bilirubin: 0.6 mg/dL (ref 0.2–1.2)
Total Protein: 7.4 g/dL (ref 6.1–8.1)

## 2019-12-15 LAB — ANTI-NUCLEAR AB-TITER (ANA TITER)
ANA TITER: 1:80 {titer} — ABNORMAL HIGH
ANA Titer 1: 1:40 {titer} — ABNORMAL HIGH

## 2019-12-15 LAB — CBC WITH DIFFERENTIAL/PLATELET
Absolute Monocytes: 977 cells/uL — ABNORMAL HIGH (ref 200–950)
Basophils Absolute: 74 cells/uL (ref 0–200)
Basophils Relative: 1 %
Eosinophils Absolute: 81 cells/uL (ref 15–500)
Eosinophils Relative: 1.1 %
HCT: 54.2 % — ABNORMAL HIGH (ref 38.5–50.0)
Hemoglobin: 18.1 g/dL — ABNORMAL HIGH (ref 13.2–17.1)
Lymphs Abs: 1672 cells/uL (ref 850–3900)
MCH: 30.1 pg (ref 27.0–33.0)
MCHC: 33.4 g/dL (ref 32.0–36.0)
MCV: 90.2 fL (ref 80.0–100.0)
MPV: 10 fL (ref 7.5–12.5)
Monocytes Relative: 13.2 %
Neutro Abs: 4595 cells/uL (ref 1500–7800)
Neutrophils Relative %: 62.1 %
Platelets: 337 10*3/uL (ref 140–400)
RBC: 6.01 10*6/uL — ABNORMAL HIGH (ref 4.20–5.80)
RDW: 13.1 % (ref 11.0–15.0)
Total Lymphocyte: 22.6 %
WBC: 7.4 10*3/uL (ref 3.8–10.8)

## 2019-12-15 LAB — SEDIMENTATION RATE: Sed Rate: 2 mm/h (ref 0–15)

## 2019-12-15 LAB — ANA: Anti Nuclear Antibody (ANA): POSITIVE — AB

## 2019-12-15 LAB — RHEUMATOID FACTOR: Rheumatoid fact SerPl-aCnc: 14 IU/mL — ABNORMAL HIGH (ref <14)

## 2019-12-15 LAB — C-REACTIVE PROTEIN: CRP: 6.8 mg/L (ref <8.0)

## 2019-12-15 LAB — TSH: TSH: 1.14 mIU/L (ref 0.40–4.50)

## 2019-12-15 LAB — T3, FREE: T3, Free: 3.2 pg/mL (ref 2.3–4.2)

## 2019-12-15 LAB — T4, FREE: Free T4: 1.1 ng/dL (ref 0.8–1.8)

## 2019-12-24 ENCOUNTER — Other Ambulatory Visit: Payer: Self-pay | Admitting: Family Medicine

## 2019-12-28 NOTE — Progress Notes (Signed)
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 male with history of liver cirrhosis, hypertension, tobacco use, anxiety and depression, psoriasis here for evaluation of polyarthralgias.  Reports the joint pain is fairly diffuse and present with worsening for years without a specific onset. His most painful involved joints are the bilateral ankles also experiences pain in hands, wrists, shoulders, and knees after seated for long periods.  He describes skin psoriasis with plaques involving mostly the elbows and knees especially the right knee.  Currently skin rash is only active on the left arm.  He also describes sensation of hands feeling "tight".  He takes low-dose oxycodone for pain relief with other analgesics limited by hepatic function. 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   Relevant previous labs include the following: ESR 2 CRP 6.8 RF 14 ANA 1:80 nucleolar   Activities of Daily Living:  Patient reports morning stiffness for 2 hours.   Patient Denies nocturnal pain.  Difficulty dressing/grooming: Denies Difficulty climbing stairs: Reports Difficulty getting out of chair: Reports Difficulty using hands for taps, buttons, cutlery, and/or writing: Denies  Review of Systems  Constitutional: Positive for fatigue.  HENT: Negative for mouth dryness.   Eyes: Positive for dryness.  Respiratory: Negative for shortness of breath.   Cardiovascular: Negative for swelling in legs/feet.  Gastrointestinal: Negative for constipation.  Endocrine:  Negative for increased urination.  Genitourinary: Negative for difficulty urinating.  Musculoskeletal: Positive for arthralgias, joint pain, joint swelling, muscle weakness, morning stiffness and muscle tenderness.  Skin: Negative for rash.  Allergic/Immunologic: Negative for susceptible to infections.  Neurological: Positive for numbness and weakness.  Hematological: Positive for bruising/bleeding tendency.  Psychiatric/Behavioral: Positive for sleep disturbance.    PMFS History:  Patient Active Problem List   Diagnosis Date Noted  . Polyarthralgia 01/08/2020  . History of diverticulosis 10/28/2019  . Internal and external bleeding hemorrhoids 08/20/2019  . Colon ulcer   . Hematochezia 08/12/2019  . Leukocytosis 08/12/2019  . Alcoholic cirrhosis ([place])   . Coagulopathy ([place])   . Lower extremity edema 07/29/2019  . Urinary hesitancy 07/29/2019  . Anemia of chronic disease 07/29/2019  . Drug-induced hepatic toxicity 07/25/2019  . Elevated bilirubin   . Elevated LFTs 07/14/2019  . MDD (major depressive disorder) 05/25/2019  . Hepatic steatosis 02/18/2019  . GAD (generalized anxiety disorder) 02/18/2019  . GERD (gastroesophageal reflux disease) 04/08/2013  . Abdominal pain 04/07/2013  . Hyperlipidemia 12/19/2012  . Essential hypertension 12/19/2012  . Routine general medical examination at a health care facility 08/07/2012  . Psoriasis 05/04/2012  . Obese 05/04/2012  . Tobacco use 05/04/2012    Past Medical History:  Diagnosis Date  . Abdominal pain   . Alcoholic hepatitis without ascites   . Anemia   . Elevated bilirubin   . Essential hypertension   . Psoriasis   . Transaminitis     Family History  Problem Relation Age of Onset  .  Asthma Father   . Cancer Sister 18       Pharyngeal  . Colon cancer Neg Hx   . Liver disease Neg Hx    Past Surgical History:  Procedure Laterality Date  . BIOPSY  07/20/2019   Procedure: BIOPSY;  Surgeon: Daneil Dolin, MD;   [facility];  Service: Endoscopy;;  . BIOPSY  08/14/2019   Procedure: BIOPSY;  Surgeon: Thornton Park, MD;  Location: [place];  Service: Gastroenterology;;  . COLONOSCOPY WITH PROPOFOL N/A 07/20/2019   Procedure: COLONOSCOPY WITH PROPOFOL;  Surgeon: Daneil Dolin, MD;  [facility];  Service: Endoscopy;  Laterality: N/A;  10:15am  . COLONOSCOPY WITH PROPOFOL N/A 08/14/2019   Procedure: COLONOSCOPY WITH PROPOFOL;  Surgeon: Thornton Park, MD;  Location: [place];  Service: Gastroenterology;  Laterality: N/A;  . IR PARACENTESIS  08/14/2019  . NO PAST SURGERIES    . POLYPECTOMY  07/20/2019   Procedure: POLYPECTOMY;  Surgeon: Daneil Dolin, MD;  [facility];  Service: Endoscopy;;  . SUBMUCOSAL TATTOO INJECTION  08/14/2019   Procedure: SUBMUCOSAL TATTOO INJECTION;  Surgeon: Thornton Park, MD;  Location[facility];  Service: Gastroenterology;;   Social History   Social History Narrative  . Not on file   Immunization History  Administered Date(s) Administered  . Hep A / Hep B 12/11/2019  . Moderna SARS-COVID-2 Vaccination 06/26/2019  . Tdap 11/08/2014     Objective: Vital Signs: BP (!) 149/82 (BP Location: Right Arm, Patient Position: Sitting, Cuff Size: Normal)   Pulse 94   Resp 16   Ht 5' 8"  (1.727 m)   Wt 188 lb (85.3 kg)   BMI 28.59 kg/m    Physical Exam HENT:     Right Ear: External ear normal.     Left Ear: External ear normal.     Nose: Nose normal.     Mouth/Throat:     Mouth: Mucous membranes are dry.     Pharynx: Oropharynx is clear.  Eyes:     Conjunctiva/sclera: Conjunctivae normal.     Pupils: Pupils are equal, round, and reactive to light.  Cardiovascular:     Rate and Rhythm: Normal rate and regular rhythm.  Abdominal:     Tenderness: There is no abdominal tenderness.  Musculoskeletal:     Cervical back: Normal range of motion and neck supple.  Skin:    General: Skin is warm and dry.     Comments: 1 to 2  cm erythematous patch on left elbow with overlying white scale otherwise clear skin  Neurological:     Mental Status: He is alert.      Musculoskeletal Exam: Normal neck range of motion, shoulder pain provoked by active abduction horizontal and above but intact with normal strength, normal elbow, wrist, fingers range of motion, no nodules, no nail abnormalities, normal knee ankle and toe range of motion no effusions, erythema, or warmth appreciated, no tenderness to pressure over Achilles tendon, normal modified Schober exam and well to tragus distance  CDAI Exam: CDAI Score: 2.5  Patient Global: 3 mm; Provider Global: 2 mm Swollen: 0 ; Tender: 2  Joint Exam 01/08/2020      Right  Left  Glenohumeral   Tender   Tender     Investigation:   Imaging: No results found.  Recent Labs: Lab Results  Component Value Date   WBC 7.4 12/11/2019   HGB 18.1 (H) 12/11/2019   PLT 337 12/11/2019   NA 137 12/11/2019  K 4.3 12/11/2019   CL 101 12/11/2019   CO2 22 12/11/2019   GLUCOSE 133 (H) 12/11/2019   BUN 6 (L) 12/11/2019   CREATININE 0.81 12/11/2019   BILITOT 0.6 12/11/2019   ALKPHOS 110 08/15/2019   AST 29 12/11/2019   ALT 17 12/11/2019   PROT 7.4 12/11/2019   ALBUMIN 2.8 (L) 08/15/2019   CALCIUM 9.9 12/11/2019   GFRAA >60 08/15/2019    Speciality Comments: No specialty comments available.  Procedures:  No procedures performed Allergies: Patient has no known allergies.   Assessment / Plan:     Visit Diagnoses: Polyarthralgia - 12/11/19: ANA 1:40NS, 1:80 Nuclear, nucleolar, CRP 6.8, RF 14, ESR 2  - Plan: Sedimentation rate, C-reactive protein, XR Ankle 2 Views Right, XR Ankle 2 Views Left, XR Hand 2 View Right, XR Hand 2 View Left  Psoriasis - Plan: Sedimentation rate, C-reactive protein, XR Ankle 2 Views Right, XR Ankle 2 Views Left, XR Hand 2 View Right, XR Hand 2 View Left  Rheumatoid factor positive  Other polyneuropathy  Essential hypertension  Hepatic  steatosis  Drug-induced hepatic toxicity  Colon ulcer  Alcoholic cirrhosis of liver without ascites (H[facility])  History of gastroesophageal reflux (GERD)  Coagulopathy (H[facility])  Tobacco use  Current moderate episode of major depressive disorder without prior episode ([place])  History of diverticulosis  History of hyperlipidemia  Orders: Orders Placed This Encounter  Procedures  . XR Ankle 2 Views Right  . XR Ankle 2 Views Left  . XR Hand 2 View Right  . XR Hand 2 View Left  . Sedimentation rate  . C-reactive protein   No orders of the defined types were placed in this encounter.    Follow-Up Instructions: Return in about 4 weeks (around 02/05/2020).   Collier Salina, MD  Note - This record has been created using [employer].  Chart creation errors have been sought, but may not always  have been located. Such creation errors do not reflect on  the standard of medical care.

## 2019-12-31 ENCOUNTER — Other Ambulatory Visit: Payer: Self-pay | Admitting: Family Medicine

## 2020-01-08 ENCOUNTER — Encounter: Payer: Self-pay | Admitting: Internal Medicine

## 2020-01-08 ENCOUNTER — Ambulatory Visit (INDEPENDENT_AMBULATORY_CARE_PROVIDER_SITE_OTHER): Payer: 59 | Admitting: Internal Medicine

## 2020-01-08 ENCOUNTER — Ambulatory Visit: Payer: Self-pay | Admitting: Gastroenterology

## 2020-01-08 ENCOUNTER — Other Ambulatory Visit: Payer: Self-pay

## 2020-01-08 VITALS — BP 149/82 | HR 94 | Resp 16 | Ht 68.0 in | Wt 188.0 lb

## 2020-01-08 DIAGNOSIS — G6289 Other specified polyneuropathies: Secondary | ICD-10-CM

## 2020-01-08 DIAGNOSIS — L409 Psoriasis, unspecified: Secondary | ICD-10-CM | POA: Diagnosis not present

## 2020-01-08 DIAGNOSIS — K716 Toxic liver disease with hepatitis, not elsewhere classified: Secondary | ICD-10-CM

## 2020-01-08 DIAGNOSIS — M2550 Pain in unspecified joint: Secondary | ICD-10-CM | POA: Insufficient documentation

## 2020-01-08 DIAGNOSIS — K7030 Alcoholic cirrhosis of liver without ascites: Secondary | ICD-10-CM

## 2020-01-08 DIAGNOSIS — F321 Major depressive disorder, single episode, moderate: Secondary | ICD-10-CM

## 2020-01-08 DIAGNOSIS — D689 Coagulation defect, unspecified: Secondary | ICD-10-CM

## 2020-01-08 DIAGNOSIS — I10 Essential (primary) hypertension: Secondary | ICD-10-CM

## 2020-01-08 DIAGNOSIS — Z8639 Personal history of other endocrine, nutritional and metabolic disease: Secondary | ICD-10-CM

## 2020-01-08 DIAGNOSIS — Z720 Tobacco use: Secondary | ICD-10-CM

## 2020-01-08 DIAGNOSIS — Z8719 Personal history of other diseases of the digestive system: Secondary | ICD-10-CM

## 2020-01-08 DIAGNOSIS — K760 Fatty (change of) liver, not elsewhere classified: Secondary | ICD-10-CM | POA: Diagnosis not present

## 2020-01-08 DIAGNOSIS — T50905A Adverse effect of unspecified drugs, medicaments and biological substances, initial encounter: Secondary | ICD-10-CM

## 2020-01-08 DIAGNOSIS — R768 Other specified abnormal immunological findings in serum: Secondary | ICD-10-CM

## 2020-01-08 DIAGNOSIS — K633 Ulcer of intestine: Secondary | ICD-10-CM

## 2020-01-08 DIAGNOSIS — R69 Illness, unspecified: Secondary | ICD-10-CM | POA: Diagnosis not present

## 2020-01-08 NOTE — Assessment & Plan Note (Signed)
Mr. Laser describes joint pain involving his shoulders hands ankles and toes bilaterally that worsens with stationary position and in the morning that suggests inflammatory arthritis.  Combined with his history of plaque psoriasis I suspect this is associated.  However at this time I do not appreciate any objective findings of joint swelling, redness, or warmth.  He also had normal inflammatory markers on most recent lab results.  No involvement of the neck or back by exam or history.  Treatment options are very restricted with a history of liver cirrhosis so trial of NSAIDs or methotrexate are contraindicated.  Repeat ESR and CRP today Recommend x-ray of hands and ankles bilaterally to check for erosive change, patient prefers to get at follow-up due to additional appointments to reach today Can consider Otezla treatment for skin and joint disease with caution due to documented history of depressive symptoms Biologic DMARDs would also be limited due to potential hepatic involvement with TNF and IL-17 treatment

## 2020-01-08 NOTE — Assessment & Plan Note (Addendum)
Ongoing tobacco use significant is a contributing factor for psoriasis and inflammatory arthritis.  Not on current cessation plan or pharmacotherapy we can discuss the role of this and effects on treatment options at next visit.

## 2020-01-08 NOTE — Assessment & Plan Note (Signed)
Liver disease history contraindicates use of nonsteroidal anti-inflammatories and methotrexate for suspected psoriatic arthritis

## 2020-01-09 LAB — SEDIMENTATION RATE: Sed Rate: 2 mm/h (ref 0–15)

## 2020-01-09 LAB — C-REACTIVE PROTEIN: CRP: 3.7 mg/L (ref <8.0)

## 2020-02-04 NOTE — Progress Notes (Signed)
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 male here for follow up of psoriatic arthritis.  Since his visit last month symptoms are not significantly changed he currently has skin rash over the left elbow but not much elsewhere.  His skin is dry he feels the color is slightly off but no other erythematous rash.  He continues to have joint pain it is worst in his shoulders hips and feet.  He has morning stiffness lasting 1 to 2 hours.  He does not notice significant swelling or redness around any joints.   History of depression currently not active and no history of suicidal ideation or attempts.  Activities of Daily Living:  Patient reports morning stiffness for 1-2 hours.   Patient Reports nocturnal pain.  Difficulty dressing/grooming: Denies Difficulty climbing stairs: Reports Difficulty getting out of chair: Denies Difficulty using hands for taps, buttons, cutlery, and/or writing: Reports  Review of Systems  Constitutional: Positive for fatigue.  HENT: Negative for mouth sores, mouth dryness and nose dryness.   Eyes: Negative for pain, itching, visual disturbance and dryness.  Respiratory: Negative for cough, hemoptysis, shortness of breath and difficulty breathing.   Cardiovascular: Negative for chest pain, palpitations and swelling in legs/feet.  Gastrointestinal: Negative for abdominal pain, blood in stool, constipation and diarrhea.  Endocrine: Negative for increased urination.  Genitourinary: Negative for painful urination.  Musculoskeletal: Positive for arthralgias, joint pain, myalgias, muscle weakness, morning stiffness, muscle tenderness and myalgias.  Negative for joint swelling.  Skin: Positive for rash and redness. Negative for color change.  Allergic/Immunologic: Negative for susceptible to infections.  Neurological: Positive for numbness. Negative for dizziness, headaches, memory loss and weakness.  Hematological: Negative for swollen glands.  Psychiatric/Behavioral: Negative for confusion and sleep disturbance.    PMFS History:  Patient Active Problem List   Diagnosis Date Noted   Polyarthralgia 01/08/2020   History of diverticulosis 10/28/2019   Internal and external bleeding hemorrhoids 08/20/2019   Colon ulcer    Hematochezia 08/12/2019   Leukocytosis 27/78/2423   Alcoholic cirrhosis ([place])    Coagulopathy ([place])    Lower extremity edema 07/29/2019   Urinary hesitancy 07/29/2019   Anemia of chronic disease 07/29/2019   Drug-induced hepatic toxicity 07/25/2019   Elevated bilirubin    Elevated LFTs 07/14/2019   Hepatic steatosis 02/18/2019   GAD (generalized anxiety disorder) 02/18/2019   GERD (gastroesophageal reflux disease) 04/08/2013   Abdominal pain 04/07/2013   Hyperlipidemia 12/19/2012   Essential hypertension 12/19/2012   Routine general medical examination at a health care facility 08/07/2012   Psoriatic arthritis ([place]) 05/04/2012   Obese 05/04/2012   Tobacco use 05/04/2012    Past Medical History:  Diagnosis Date   Abdominal pain    Alcoholic hepatitis without ascites    Anemia    Elevated bilirubin    Essential hypertension    Psoriasis    Transaminitis     Family History  Problem Relation Age of Onset   Asthma Father    Cancer Sister 4       Pharyngeal  Colon cancer Neg Hx    Liver disease Neg Hx    Past Surgical History:  Procedure Laterality Date   BIOPSY  07/20/2019   Procedure: BIOPSY;  Surgeon: Daneil Dolin, MD;  [facility];  Service: Endoscopy;;   BIOPSY  08/14/2019   Procedure: BIOPSY;  Surgeon: Thornton Park, MD;  Location: [place];  Service: Gastroenterology;;   COLONOSCOPY WITH PROPOFOL N/A 07/20/2019   Procedure: COLONOSCOPY WITH PROPOFOL;  Surgeon: Daneil Dolin, MD;  [facility];  Service: Endoscopy;  Laterality: N/A;  10:15am   COLONOSCOPY WITH PROPOFOL N/A 08/14/2019   Procedure: COLONOSCOPY WITH PROPOFOL;  Surgeon: Thornton Park, MD;  Location: [place];  Service: Gastroenterology;  Laterality: N/A;   IR PARACENTESIS  08/14/2019   POLYPECTOMY  07/20/2019   Procedure: POLYPECTOMY;  Surgeon: Daneil Dolin, MD;  [facility];  Service: Endoscopy;;   SUBMUCOSAL TATTOO INJECTION  08/14/2019   Procedure: SUBMUCOSAL TATTOO INJECTION;  Surgeon: Thornton Park, MD;  Location[facility];  Service: Gastroenterology;;   Social History   Social History Narrative   Not on file   Immunization History  Administered Date(s) Administered   Hep A / Hep B 12/11/2019   Moderna SARS-COVID-2 Vaccination 06/26/2019   Tdap 11/08/2014     Objective: Vital Signs: BP 133/78 (BP Location: Right Arm, Patient Position: Sitting, Cuff Size: Small)    Pulse 78    Ht 5' 8"  (1.727 m)    Wt 196 lb (88.9 kg)    BMI 29.80 kg/m    Physical Exam HENT:     Right Ear: External ear normal.     Left Ear: External ear normal.     Mouth/Throat:     Mouth: Mucous membranes are moist.     Pharynx: Oropharynx is clear.  Eyes:     Conjunctiva/sclera: Conjunctivae normal.     Pupils: Pupils are equal, round, and reactive to light.  Skin:    General: Skin is warm and dry.     Comments: Small erythematous plaques over left elbow extensor surface, single red, 5m on umbilicus Tortuous superficial venous varicosities on legs and some on upper chest  Neurological:     Mental Status: He is alert.     Musculoskeletal Exam:  Neck full range of motion no tenderness Shoulder, elbow, wrist, fingers full range of motion no tenderness or swelling No paraspinal tenderness to palpation over upper and  lower back Normal hip internal and external rotation without pain, no tenderness to lateral hip palpation Knees, ankles, MTPs full range of motion no tenderness or swelling  CDAI Exam: CDAI Score: -- Patient Global: --; Provider Global: -- Swollen: --; Tender: -- Joint Exam 02/05/2020   No joint exam has been documented for this visit   There is currently no information documented on the homunculus. Go to the Rheumatology activity and complete the homunculus joint exam.  Investigation: No additional findings.  Imaging: XR Ankle 2 Views Left  Result Date: 02/05/2020 X-ray left ankle 2 views Tibiotalar joint normal space and alignment.  No changes of midfoot joints.  Possible early plantar small enthesophyte forming.  Bone mineralization appears normal.  No soft tissue swelling seen. Impression No significant arthritis changes seen  XR Ankle 2 Views Right  Result Date: 02/05/2020 X-ray right ankle 2 views Tibiotalar joint normal space and alignment.  No midfoot joint changes.  Bone mineralization appears normal.  No soft tissue swelling seen. Impression No significant arthritis changes seen.  XR Hand 2 View Left  Result Date: 02/05/2020 X-ray left hand 2 views Radiocarpal and carpal joint space appear normal.  Normal MCP, PIP, DIP joints.  First digit IP joint not clearly visualized. bone mineralization appears normal.  No soft tissue swelling seen. Impression No significant arthritis changes seen.  XR Hand 2 View Right  Result Date: 02/05/2020 X-ray right hand 2 views Radiocarpal and carpal joint spaces appear normal.  No changes in MCP, PIP, DIP joints.  Bone mineralization appears normal.  No soft tissue swelling seen. Impression No significant arthritis changes seen   Recent Labs: Lab Results  Component Value Date   WBC 7.4 12/11/2019   HGB 18.1 (H) 12/11/2019   PLT 337 12/11/2019   NA 137 12/11/2019   K 4.3 12/11/2019   CL 101 12/11/2019   CO2 22 12/11/2019    GLUCOSE 133 (H) 12/11/2019   BUN 6 (L) 12/11/2019   CREATININE 0.81 12/11/2019   BILITOT 0.6 12/11/2019   ALKPHOS 110 08/15/2019   AST 29 12/11/2019   ALT 17 12/11/2019   PROT 7.4 12/11/2019   ALBUMIN 2.8 (L) 08/15/2019   CALCIUM 9.9 12/11/2019   GFRAA >60 08/15/2019    Speciality Comments: No specialty comments available.  Procedures:  No procedures performed Allergies: Patient has no known allergies.   Assessment / Plan:     Visit Diagnoses: Psoriatic arthritis ([place]) - Plan: XR Ankle 2 Views Right, XR Ankle 2 Views Left, XR Hand 2 View Right, XR Hand 2 View Left  Skin disease activity is fairly minimal currently but he is still having persistent joint pain and stiffness. Treatment options are very limited due to liver disease. I recommend Otezla since NSAIDs, TNFi treatments higher risk for affecting liver function. He has history of depression but denies current mood problem and denies any history of suicidal ideation or attempts. Xrays of hands and ankles today show no erosive disease. He requests to review medication information and discuss next week so will plan to start after that time.   Orders: Orders Placed This Encounter  Procedures   XR Ankle 2 Views Right   XR Ankle 2 Views Left   XR Hand 2 View Right   XR Hand 2 View Left   No orders of the defined types were placed in this encounter.   Follow-Up Instructions: Return in about 3 months (around 05/07/2020).   Collier Salina, MD  Note - This record has been created using [employer].  Chart creation errors have been sought, but may not always  have been located. Such creation errors do not reflect on  the standard of medical care.

## 2020-02-05 ENCOUNTER — Ambulatory Visit: Payer: Self-pay

## 2020-02-05 ENCOUNTER — Ambulatory Visit: Payer: 59 | Admitting: Internal Medicine

## 2020-02-05 ENCOUNTER — Ambulatory Visit (INDEPENDENT_AMBULATORY_CARE_PROVIDER_SITE_OTHER): Payer: 59 | Admitting: Gastroenterology

## 2020-02-05 ENCOUNTER — Encounter: Payer: Self-pay | Admitting: Gastroenterology

## 2020-02-05 ENCOUNTER — Other Ambulatory Visit (INDEPENDENT_AMBULATORY_CARE_PROVIDER_SITE_OTHER): Payer: 59

## 2020-02-05 ENCOUNTER — Encounter: Payer: Self-pay | Admitting: Internal Medicine

## 2020-02-05 ENCOUNTER — Other Ambulatory Visit: Payer: Self-pay

## 2020-02-05 VITALS — BP 100/66 | HR 99 | Ht 68.0 in | Wt 195.0 lb

## 2020-02-05 VITALS — BP 133/78 | HR 78 | Ht 68.0 in | Wt 196.0 lb

## 2020-02-05 DIAGNOSIS — M25541 Pain in joints of right hand: Secondary | ICD-10-CM

## 2020-02-05 DIAGNOSIS — M25572 Pain in left ankle and joints of left foot: Secondary | ICD-10-CM

## 2020-02-05 DIAGNOSIS — K7290 Hepatic failure, unspecified without coma: Secondary | ICD-10-CM

## 2020-02-05 DIAGNOSIS — K7682 Hepatic encephalopathy: Secondary | ICD-10-CM

## 2020-02-05 DIAGNOSIS — L4050 Arthropathic psoriasis, unspecified: Secondary | ICD-10-CM | POA: Diagnosis not present

## 2020-02-05 DIAGNOSIS — M25542 Pain in joints of left hand: Secondary | ICD-10-CM | POA: Diagnosis not present

## 2020-02-05 DIAGNOSIS — M25571 Pain in right ankle and joints of right foot: Secondary | ICD-10-CM

## 2020-02-05 DIAGNOSIS — K7469 Other cirrhosis of liver: Secondary | ICD-10-CM

## 2020-02-05 DIAGNOSIS — K7011 Alcoholic hepatitis with ascites: Secondary | ICD-10-CM | POA: Diagnosis not present

## 2020-02-05 DIAGNOSIS — K709 Alcoholic liver disease, unspecified: Secondary | ICD-10-CM | POA: Diagnosis not present

## 2020-02-05 DIAGNOSIS — R69 Illness, unspecified: Secondary | ICD-10-CM | POA: Diagnosis not present

## 2020-02-05 LAB — CBC
HCT: 51.7 % (ref 39.0–52.0)
Hemoglobin: 17.7 g/dL — ABNORMAL HIGH (ref 13.0–17.0)
MCHC: 34.2 g/dL (ref 30.0–36.0)
MCV: 88.8 fl (ref 78.0–100.0)
Platelets: 229 10*3/uL (ref 150.0–400.0)
RBC: 5.81 Mil/uL (ref 4.22–5.81)
RDW: 18 % — ABNORMAL HIGH (ref 11.5–15.5)
WBC: 9.2 10*3/uL (ref 4.0–10.5)

## 2020-02-05 LAB — COMPREHENSIVE METABOLIC PANEL
ALT: 30 U/L (ref 0–53)
AST: 37 U/L (ref 0–37)
Albumin: 4.4 g/dL (ref 3.5–5.2)
Alkaline Phosphatase: 145 U/L — ABNORMAL HIGH (ref 39–117)
BUN: 12 mg/dL (ref 6–23)
CO2: 27 mEq/L (ref 19–32)
Calcium: 9.8 mg/dL (ref 8.4–10.5)
Chloride: 102 mEq/L (ref 96–112)
Creatinine, Ser: 0.72 mg/dL (ref 0.40–1.50)
GFR: 108.43 mL/min (ref >60.00)
Glucose, Bld: 99 mg/dL (ref 70–99)
Potassium: 3.8 mEq/L (ref 3.5–5.1)
Sodium: 139 mEq/L (ref 135–145)
Total Bilirubin: 0.6 mg/dL (ref 0.2–1.2)
Total Protein: 7.6 g/dL (ref 6.0–8.3)

## 2020-02-05 LAB — PROTIME-INR
INR: 1.1 ratio — ABNORMAL HIGH (ref 0.8–1.0)
Prothrombin Time: 12.2 s (ref 9.6–13.1)

## 2020-02-05 MED ORDER — FDGARD 25-20.75 MG PO CAPS: ORAL_CAPSULE | ORAL | 0 refills | Status: DC

## 2020-02-05 MED ORDER — RIFAXIMIN 550 MG PO TABS: 550.0000 mg | ORAL_TABLET | Freq: Two times a day (BID) | ORAL | 0 refills | Status: DC

## 2020-02-05 NOTE — Patient Instructions (Signed)
Psoriatic Arthritis Psoriatic arthritis is a long-term (chronic) condition that causes pain, swelling, and stiffness in the joints. The large joints of the legs, hips, and pelvis are most often affected. The joints in the neck and back may also be affected. Sometimes psoriatic arthritis can involve joints in the toes, fingers, wrists, and elbows. Most people with psoriatic arthritis have a chronic skin disease that causes itchy scales and patches to form on the skin (psoriasis) before they develop psoriatic arthritis. In some cases, a person may have psoriatic arthritis before or without having psoriasis. Psoriatic arthritis can be mild or severe. It may come and go or cause symptoms all the time. It may affect one joint, a few joints, or many joints. In severe cases, untreated psoriatic arthritis can cause joint damage. What are the causes? The exact cause of this condition is not known. Psoriatic arthritis is an autoimmune disease. With this type of disease, the body's defense system (immune system) mistakenly attacks healthy tissues. If you have psoriasis, the immune system attacks the skin. With psoriatic arthritis, the immune system attacks joints and the tissues that connect muscles to joints (tendons). The disease may be activated by a trigger, such as an infection or stress. What increases the risk? You are more likely to develop this condition if:  You have psoriasis. This is the biggest risk factor. Most people who develop psoriatic arthritis have had psoriasis for 5 to 10 years.  You have a family history of psoriasis or psoriatic arthritis.  You are between the ages of 14 and 56. What are the signs or symptoms? The main symptom of this condition is inflammation of joints and tendons. Other symptoms may include:  Joint swelling.  Joint pain.  Joint stiffness, especially in the morning.  Swollen fingers and toes.  Pain in areas where tendons connect to bones.  Pain in the  heel or sole of the foot.  Pitted and weak nails.  Tiredness (fatigue).  Eye redness. How is this diagnosed? This condition may be diagnosed based on:  Your symptoms and medical history.  A physical exam.  X-rays to look for joint inflammation or damage, especially in the joints of the pelvis (sacroiliac joints).  Other imaging tests, such as a CT scan or MRI.  Blood tests to look for inflammation and to rule out other causes of joint inflammation, such as gout or rheumatoid arthritis. How is this treated? The goal of treatment is to reduce pain and inflammation and protect joints from damage. Treatment depends on how severe the inflammation is and how many joints are affected. Treatment may include:  Medicines, such as: ? NSAIDs to relieve pain and inflammation. For people with mild disease, these may be the only medicines needed. ? Disease-modifying antirheumatic drugs (DMARDs). ? Biologic medicines. These may be used for severe inflammation or if other medicines are not working. These medicines are usually given as injections or through an IV. They are very effective for many people with inflammatory arthritis, but there is an increased risk of infection.  Physical therapy and other exercise to strengthen muscles that support joints and to prevent joint stiffness.  A brace or splint to support a painful and swollen joint.  Surgery to reconstruct or replace a joint. This may be an option for people with severe joint damage if other treatments have not helped. Follow these instructions at home: Medicines  Take over-the-counter and prescription medicines only as told by your health care provider.  Be aware  of the possible side effects of your medicine and know when to call your health care provider.  If you are taking a biologic, let your health care provider know if you have signs or symptoms of an infection. You may need to stop treatment until your infection clears  up. Managing pain, stiffness, and swelling   If directed, put ice on painful areas. ? Put ice in a plastic bag. ? Place a towel between your skin and the bag. ? Leave the ice on for 20 minutes, 2-3 times a day. If you have a brace or splint:  Wear the brace or splint as told by your health care provider. Remove it only as told by your health care provider.  Loosen the brace or splint if your fingers or toes tingle, become numb, or turn cold and blue.  Keep the brace or splint clean.  If the brace or splint is not waterproof: ? Do not let it get wet. ? Cover it with a watertight covering when you take a bath or shower. Activity  Return to your normal activities as told by your health care provider. Ask your health care provider what activities are safe for you.  Get regular exercise. Ask your health care provider what type of exercise is best for you. Your health care provider may recommend: ? Low-impact exercises such as walking, biking, or swimming. ? Exercises that include stretching, such as tai chi and yoga.  Do not exercise when you have a flare of symptoms. Rest until the symptoms improve. Eating and drinking   Do not drink alcohol if you are taking an NSAID. Alcohol and NSAIDs can cause stomach irritation.  Eat a healthy diet that includes plenty of vegetables, fruits, whole grains, low-fat dairy products, and lean protein. Do not eat a lot of foods that are high in solid fats, added sugars, or salt. General instructions  Do not use any products that contain nicotine or tobacco, such as cigarettes, e-cigarettes, and chewing tobacco. These can make arthritis worse. If you need help quitting, ask your health care provider.  Maintain a healthy weight. A healthy weight will help you stay active and take stress off your joints.  Stay up to date on all immunizations, including the yearly (annual) flu vaccine.  Keep all follow-up visits as told by your health care provider.  This is important. Where to find more information  American Academy of Dermatology: http://jones-macias.info/  American College of Rheumatology: www.rheumatology.[place]: www.psoriasis.org Contact a health care provider if:  Your signs and symptoms flare up.  You have side effects from your medicines.  You are taking a biologic and you have a fever or other signs of infection, such as: ? Chills. ? Feeling tired. ? Cough or sore throat. ? Loss of appetite. Summary  Psoriatic arthritis is an autoimmune disease that causes joint pain, swelling, and stiffness.  Most people with psoriatic arthritis have the skin disease called psoriasis first.  You may have imaging tests and blood tests to help your health care provider diagnose this condition.  The goal of treatment is to reduce pain and inflammation and protect joints from damage.  Medicines can relieve symptoms and prevent joint damage. This information is not intended to replace advice given to you by your health care provider. Make sure you discuss any questions you have with your health care provider. Document Revised: 07/22/2018 Document Reviewed: 11/28/2017 Elsevier Patient Education  [place] oral  this medicine? APREMILAST (a PRE mil ast) is used to treat plaque psoriasis, psoriatic arthritis, and certain oral ulcers. This medicine may be used for other purposes; ask your health care provider or pharmacist if you have questions. COMMON BRAND NAME(S): Otezla What should I tell my health care provider before I take this medicine? They need to know if you have any of these conditions:  dehydration  kidney disease  mental illness  an unusual or allergic reaction to apremilast, other medicines, foods, dyes, or preservatives  pregnant or trying to get pregnant  breast-feeding How should I use this medicine? Take this medicine by mouth with a glass of water. Follow the  directions on the prescription label. Do not cut, crush or chew this medicine. You can take it with or without food. If it upsets your stomach, take it with food. Take your medicine at regular intervals. Do not take it more often than directed. Do not stop taking except on your doctor's advice. Talk to your pediatrician regarding the use of this medicine in children. Special care may be needed. Overdosage: If you think you have taken too much of this medicine contact a poison control center or emergency room at once. NOTE: This medicine is only for you. Do not share this medicine with others. What if I miss a dose? If you miss a dose, take it as soon as you can. If it is almost time for your next dose, take only that dose. Do not take double or extra doses. What may interact with this medicine? This medicine may interact with the following medications:  certain medicines for seizures like carbamazepine, phenobarbital, phenytoin  rifampin This list may not describe all possible interactions. Give your health care provider a list of all the medicines, herbs, non-prescription drugs, or dietary supplements you use. Also tell them if you smoke, drink alcohol, or use illegal drugs. Some items may interact with your medicine. What should I watch for while using this medicine? Tell your doctor or healthcare professional if your symptoms do not start to get better or if they get worse. Patients and their families should watch out for new or worsening depression or thoughts of suicide. Also watch out for sudden changes in feelings such as feeling anxious, agitated, panicky, irritable, hostile, aggressive, impulsive, severely restless, overly excited and hyperactive, or not being able to sleep. If this happens, call your health care professional. Check with your doctor or health care professional if you get an attack of severe diarrhea, nausea and vomiting, or if you sweat a lot. The loss of too much body fluid  can make it dangerous for you to take this medicine. What side effects may I notice from receiving this medicine? Side effects that you should report to your doctor or health care professional as soon as possible:  depressed mood  weight loss Side effects that usually do not require medical attention (report to your doctor or health care professional if they continue or are bothersome):  diarrhea  headache  nausea, vomiting This list may not describe all possible side effects. Call your doctor for medical advice about side effects. You may report side effects to FDA at 1-800-FDA-1088. Where should I keep my medicine? Keep out of the reach of children. Store below 30 degrees C (86 degrees F). Throw away any unused medicine after the expiration date. NOTE: This sheet is a summary. It may not cover all possible information. If you have questions about this medicine, talk to your   talk to your doctor, pharmacist, or health care provider.  2020 Elsevier/Gold Standard (2017-10-28 12:47:14)

## 2020-02-05 NOTE — Patient Instructions (Addendum)
If you are age 48 or younger, your body mass index should be between 19-25. Your Body mass index is 29.65 kg/m. If this is out of the aformentioned range listed, please consider follow up with your Primary Care Provider.   LABS: Your provider has requested that you go to the basement level for lab work before leaving today. Press "B" on the elevator. The lab is located at the first door on the left as you exit the elevator.  HEALTHCARE LAWS AND MY CHART RESULTS: Due to recent changes in healthcare laws, you may see the results of your imaging and laboratory studies on MyChart before your provider has had a chance to review them.  We understand that in some cases there may be results that are confusing or concerning to you. Not all laboratory results come back in the same time frame and the provider may be waiting for multiple results in order to interpret others.  Please give [place] 48 hours in order for your provider to thoroughly review all the results before contacting the office for clarification of your results.   PRESCRIPTION MEDICATION(S): We have sent the following medication(s) to your pharmacy:  . Doreene Nest - Please take 590m by mouth 3 times per day x14 days  SAMPLES: We have given you samples of the following medication to take:  . FDGard - Please take as indicated below  [facility] on [facility]/20 at 11:30am. Please arrive 15 minutes prior to your appointment for registration. Make certain not to have anything to eat or drink 6 hours prior to your appointment. Should you need to reschedule your appointment, please contact radiology at [phone] This test typically takes about 30 minutes to perform.  It is time to prioritize loving your liver.  Please stop by the lab today for blood work.   To be a candidate for a liver transplant in the future you must abstain from all alcohol and tobacco.  I am  recommending a trial of Xifaxan to see if this can help with your bloating. You use this antibiotics for 2 weeks to reset your gut bacteria.   I will also send you home with samples of FDGard, as this may also help with your bloating if the Xifaxan does not help. I am recommending a trial of FDGard - 2 capsules taken twice daily for 4 weeks.  Please take FDGard 30 to 60 minutes before meals with water.  There is no distinct pattern of side effects with FDGard, but, sometimes patients experience a mild tingling sensation in the gut within the first 30 minutes. This sensation should subsequently subside.   You should have an imaging study every 6 months to monitor for the development of hepatocellular carcinoma (liver cancer). The risk is low, but, if liver cancer is diagnosed early, there are better treatment options. Your CT scan was in May, so you are do an ultrasound study in November.   I recommend a high-protein, primarily plant-based diet.  Avoid red meat.  No raw or undercooked meat, seafood, or shellfish.  Work to maintain a health weight. Minimize salt intake.  Please do not consume more than 2000 mg of sodium every day.  I recommend that you take a multivitamin every day.  Stay active. Weight-based exercise for 30 minutes at least 3 days a week is recommended.  I recommend that you not drink any alcohol including beer, wine, liquor, and non-alcoholic beer.  Formal alcohol counseling can be very helpful.   I have recommend that you complete your vaccinations for hepatitis A and hepatitis B. I also recommend that you obtain the seasonal flu vaccine and the Pneumovax, if you have not already had them.   The [employer] and UpToDate are good websites for information about your liver.   Please follow-up with the [facility] this fall as they recommended.  Let's plan to see each other in 4-6 months, earlier as necessary. Please call the office at [phone] to  schedule your appointment.

## 2020-02-05 NOTE — Progress Notes (Signed)
Referring Provider: Alycia Rossetti, MD Primary Care Physician:  Alycia Rossetti, MD  Chief complaint:  Hospital follow-up   IMPRESSION:  Alcoholic hepatitis with suspected underlying cirrhosis    -  treated with steroids 07/2019-08/2019    - ongoing alcohol use without patient interest in abstinence    - labs today to monitor for response to therapy, hepatic synthetic dysfunction    - no prior EGD to screen for varices History of encephalopathy while hospitalized    - on lactulose as an outpatient    - no symptoms consistent with HE today History of ascites, not currently on diuretics     - 2000 mg sodium restricted diet Increased gas/bloat without ascites on exam    - Possible SIBO which is common in the setting of advanced liver disease History of hematochezia due to hemorrhoids Tobacco habituation  PLAN: - 2000 mg sodium restricted diet - Xifaxan 550 mg TID x 14 days - FDGard PRN samples provided to treat his bloating - CMP, CBC, PT/INR, AFP - Abdominal ultrasound for hepatocellular carcinoma screening - Counseled to abstain from all alcohol, formal counseling recommended - He should quit smoking - Flu vaccine, Pneumovax recommended - Complete Twinrix vaccine as previously planned (receiving through PCP) - Follow-up with [facility] (appears he is due next visit in November 2021) - EGD to screen for varices  - Follow-up in 4-6 months, earlier if needed  Please see the "Patient Instructions" section for addition details about the plan.  HPI: Christopher Carrillo is a 48 y.o. male who returns in hospital follow-up.  He was hospitalized in May for acute alcoholic hepatitis and suspected cirrhosis and hematochezia that was ultimately attributed to hemorrhoidal bleeding.  I performed his endoscopic evaluation during that hospitalization.  He returns now in follow-up.  He had a colonoscopy on 07/08/2019 due to what felt to be a colon mass, but subsequently he was  diagnosed with intermittent ileocecal intussusception after he had benign enteric mucosa findings. The patient was admitted from 07/21/2019 to 07/26/2019 and treated for confusion, colitis and rising bilirubin level at [facility]. It he was believed that he may have developed hepatitis induced by ketamine and propofol in the setting of chronic alcohol abuse. He was subsequently admitted again at [facility] 07/29/2019 to 08/06/2019 for similar symptoms but with the development of lower extremity edema and confusion. His ammonia was slightly elevated and he was started on lactulose. He was discharged on prednisolone for possible alcohol induced hepatitis, as well as furosemide, Aldactone and lactulose. He was hospitalized 08/12/19-08/15/19 for painless hematochezia ultimately attributed to hemorrhoids.    He has an appointment with the [facility] 08/26/2019. He was told that he needed to stop using alcohol and cigarettes to be considered for liver transplant. Thought they did not want him to return, although review of limited records from them in Epic shows that a 3 month follow-up appointment was recommended.   Returns today in scheduled follow-up. Completed steroid taper in June. No jaundice or scleral icterus.   Reports post-prandial bloating. Will note abdominal distension within 30 minutes of eating. Lactulose will provide some relief, especially if it feels like gas.   He finds that he has severe diarrhea if he is using lactulose and can't take it when working.   No falls. Feels foggy headed. No day time sleeping. No reversal in sleep-wake cycle. Notes that he often removes his CPAP machine in the night.   No longer taking diuretics. Denies edema  or increasing abdominal girth.   Under evaluation for shoulder pain. Doctor suggested [place] but he afraid to use it in the setting of liver disease.   Continues to drink beer at least once or twice weekly. 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 Smoking 0.25 PPD or Marlboro Lights.   Most recent labs from 12/11/2019: Sodium 137, creatinine 0.81, albumin not included, total bilirubin 0.6, AST 29, ALT 17, alk phos 145, CRP 6.8, hemoglobin 8.1, MCV 90.2, RDW 13.1, platelets 337    Past Medical History:  Diagnosis Date  . Abdominal pain   . Alcoholic hepatitis without ascites   . Anemia   . Elevated bilirubin   . Essential hypertension   . Psoriasis   . Transaminitis     Past Surgical History:  Procedure Laterality Date  . BIOPSY  07/20/2019   Procedure: BIOPSY;  Surgeon: Daneil Dolin, MD;  [facility];  Service: Endoscopy;;  . BIOPSY  08/14/2019   Procedure: BIOPSY;  Surgeon: Thornton Park, MD;  Location: [place];  Service: Gastroenterology;;  . COLONOSCOPY WITH PROPOFOL N/A 07/20/2019   Procedure: COLONOSCOPY WITH PROPOFOL;  Surgeon: Daneil Dolin, MD;  [facility];  Service: Endoscopy;  Laterality: N/A;  10:15am  . COLONOSCOPY WITH PROPOFOL N/A 08/14/2019   Procedure: COLONOSCOPY WITH PROPOFOL;  Surgeon: Thornton Park, MD;  Location: [place];  Service: Gastroenterology;  Laterality: N/A;  . IR PARACENTESIS  08/14/2019  . POLYPECTOMY  07/20/2019   Procedure: POLYPECTOMY;  Surgeon: Daneil Dolin, MD;  [facility];  Service: Endoscopy;;  . SUBMUCOSAL TATTOO INJECTION  08/14/2019   Procedure: SUBMUCOSAL TATTOO INJECTION;  Surgeon: Thornton Park, MD;  Location: [place];  Service: Gastroenterology;;    Current Outpatient Medications  Medication Sig Dispense Refill  . folic acid (FOLVITE) 1 MG tablet Take 1 tablet (1 mg total) by mouth daily.    . hydrocortisone 2.5 % cream Apply pea sized amount rectally BID PRN- please dispense with rectal tip 30 g 0  . lactulose (CHRONULAC) 10 GM/15ML solution TAKE 15 MILLILITERS BY MOUTH BID-REDUCE OR STOP TAKING IF MORE THAN 3 BOWEL MOVEMENTS A DAY 946 mL 0  . Multiple Vitamin (MULTIVITAMIN WITH MINERALS)  TABS tablet Take 1 tablet by mouth daily.     No current facility-administered medications for this visit.    Allergies as of 02/05/2020  . (No Known Allergies)    Family History  Problem Relation Age of Onset  . Asthma Father   . Cancer Sister 18       Pharyngeal  . Colon cancer Neg Hx   . Liver disease Neg Hx     Social History   Socioeconomic History  . Marital status: Married    Spouse name: Not on file  . Number of children: Not on file  . Years of education: Not on file  . Highest education level: Not on file  Occupational History  . Not on file  Tobacco Use  . Smoking status: Current Every Day Smoker    Packs/day: 0.33    Types: Cigarettes  . Smokeless tobacco: Never Used  Vaping Use  . Vaping Use: Never used  Substance and Sexual Activity  . Alcohol use: Yes    Alcohol/week: 1.0 standard drink    Types: 1 Cans of beer per week  . Drug use: No  . Sexual activity: Yes  Other Topics Concern  . Not on  file  Social History Narrative  . Not on file   Social Determinants of Health   Financial Resource Strain:   . Difficulty of Paying Living Expenses: Not on file  Food Insecurity:   . Worried About Charity fundraiser in the Last Year: Not on file  . Ran Out of Food in the Last Year: Not on file  Transportation Needs:   . Lack of Transportation (Medical): Not on file  . Lack of Transportation (Non-Medical): Not on file  Physical Activity:   . Days of Exercise per Week: Not on file  . Minutes of Exercise per Session: Not on file  Stress:   . Feeling of Stress : Not on file  Social Connections:   . Frequency of Communication with Friends and Family: Not on file  . Frequency of Social Gatherings with Friends and Family: Not on file  . Attends Religious Services: Not on file  . Active Member of Clubs or Organizations: Not on file  . Attends Archivist Meetings: Not on file  . Marital Status: Not on file  Intimate Partner Violence:   . Fear of  Current or Ex-Partner: Not on file  . Emotionally Abused: Not on file  . Physically Abused: Not on file  . Sexually Abused: Not on file    Review of Systems: 12 system ROS is negative except as noted above except for headaches and arthritis.   Physical Exam: General:   Alert, in NAD. No scleral icterus. No bilateral temporal wasting.  Heart:  Regular rate and rhythm; no murmurs Pulm: Clear anteriorly; no wheezing Abdomen:  Soft. Nontender. Nondistended. Normal bowel sounds. No rebound or guarding. No fluid wave.  LAD: No inguinal or umbilical LAD Extremities:  Without edema. Neurologic:  Alert and  oriented x4;  grossly normal neurologically; no asterixis or clonus. Skin: No jaundice. No palmar erythema. Multiple spider angioma on the chest wall. Terry's nails.  Psych:  Alert and cooperative. Normal mood and affect.   Geovany Trudo L. Tarri Glenn, MD, MPH 02/05/2020, 4:01 PM

## 2020-02-08 ENCOUNTER — Telehealth: Payer: Self-pay

## 2020-02-08 LAB — AFP TUMOR MARKER: AFP-Tumor Marker: 4 ng/mL (ref <6.1)

## 2020-02-08 NOTE — Telephone Encounter (Signed)
PRIOR AUTHORIZATION  PA initiation date: 02/08/20  Medication: Xifaxan 574m Insurance Company: CContinental Airlinescompleted electronically through CConsecoMy Meds: Yes  CSARP VERNIER(Key: [id number])  This request has received an approval.   APPROVAL  Medication: Xifaxan 5581mInsurance Company: [employer] PA response: Approved

## 2020-02-11 ENCOUNTER — Ambulatory Visit (HOSPITAL_COMMUNITY): Payer: 59

## 2020-02-17 ENCOUNTER — Other Ambulatory Visit: Payer: 59

## 2020-02-17 ENCOUNTER — Telehealth: Payer: Self-pay | Admitting: *Deleted

## 2020-02-17 DIAGNOSIS — Z0189 Encounter for other specified special examinations: Secondary | ICD-10-CM | POA: Diagnosis not present

## 2020-02-17 NOTE — Telephone Encounter (Signed)
Okay to swab for covid

## 2020-02-17 NOTE — Telephone Encounter (Signed)
Received call from patient.   Reports that he requires COVID testing. States that mother fell on 02/16/2020 and has Hip[ Fx. States that he cannot go to hospital to visit or get instructions until neg test is available.   Also reports that he does have some nasal congestion, and [place] with a lot of unmasked people.   Does report he has had vaccines.   Advised to come to office for lab visit for swab.

## 2020-02-18 ENCOUNTER — Ambulatory Visit (HOSPITAL_COMMUNITY): Payer: 59

## 2020-02-18 ENCOUNTER — Encounter (HOSPITAL_COMMUNITY): Payer: Self-pay

## 2020-02-18 LAB — SARS-COV-2 RNA,(COVID-19) QUALITATIVE NAAT: SARS CoV2 RNA: NOT DETECTED

## 2020-02-21 ENCOUNTER — Other Ambulatory Visit: Payer: Self-pay | Admitting: Family Medicine

## 2020-03-17 ENCOUNTER — Other Ambulatory Visit: Payer: Self-pay

## 2020-03-17 ENCOUNTER — Ambulatory Visit (HOSPITAL_COMMUNITY)
Admission: RE | Admit: 2020-03-17 | Discharge: 2020-03-17 | Disposition: A | Discharge status: Home / Self Care | Payer: 59 | Source: Ambulatory Visit | Attending: Gastroenterology | Admitting: Gastroenterology

## 2020-03-17 DIAGNOSIS — R160 Hepatomegaly, not elsewhere classified: Secondary | ICD-10-CM | POA: Diagnosis not present

## 2020-03-17 DIAGNOSIS — K7469 Other cirrhosis of liver: Secondary | ICD-10-CM | POA: Diagnosis not present

## 2020-04-04 ENCOUNTER — Telehealth: Payer: Self-pay | Admitting: *Deleted

## 2020-04-04 NOTE — Telephone Encounter (Signed)
Received VM from patient.   Reports that he is having increased nausea, dry heaves, dizziness and tremors. States that he is concerned he is having issues with GI / diverticulosis again.   Call placed to patient for more information. [place].

## 2020-04-04 NOTE — Telephone Encounter (Signed)
Noted, pt needs appt

## 2020-04-04 NOTE — Telephone Encounter (Signed)
Received return call. States that he has had chills, tremors, and fatigue x days. States that he had similar Sx with hypokalemia.   Denied fever, cough, SOB, chest pain, diarrhea.   Appointment scheduled for evaluation.

## 2020-04-05 ENCOUNTER — Other Ambulatory Visit: Payer: Self-pay

## 2020-04-05 ENCOUNTER — Ambulatory Visit (INDEPENDENT_AMBULATORY_CARE_PROVIDER_SITE_OTHER): Payer: 59 | Admitting: Family Medicine

## 2020-04-05 VITALS — BP 140/100 | HR 97 | Temp 98.5°F | Ht 68.0 in | Wt 195.0 lb

## 2020-04-05 DIAGNOSIS — R251 Tremor, unspecified: Secondary | ICD-10-CM

## 2020-04-05 MED ORDER — LORAZEPAM 0.5 MG PO TABS: 0.5000 mg | ORAL_TABLET | Freq: Two times a day (BID) | ORAL | 0 refills | Status: DC | PRN

## 2020-04-05 NOTE — Progress Notes (Signed)
Subjective:    Patient ID: Christopher Carrillo, male    DOB: 02-12-1971, 49 y.o.   MRN: [id number]  HPI Since I last saw the patient, he was found to have developed cirrhosis and acute hepatitis secondary to alcohol use.  He also was found to have a mass in his colon then on further evaluation was found to be due to intussusception.  At the present time he is battling cirrhosis.  However he has done remarkably well since I last saw him.  He is back at work.  His cirrhosis appears stable.  He is trying to limit his alcohol to just an occasional beer.  He is not using Lasix or lactulose.  He has not had hepatic encephalopathy since his hospitalization last year.  He is uncertain of when to take the Lasix and went to take the lactulose.  The reason that he presents today as yesterday he was at work.  He suddenly felt a hot flash come over him.  He felt like he was going to pass out.  He instantly became extremely jittery and shaky.  He had to leave work.  He states that he was having a difficult time driving because his arms and his legs would not stop shaking.  They were not myoclonic jerks.  Instead they were tremor-like and shaking.  Today he does not have any asterixis on exam.  He does have a high-frequency fine tremor in both hands.  He denies any heavy alcohol use that would suggest DT.  He denies any confusion or hallucinations that would suggest encephalopathy.  He denies any fevers or chills.  He is not a diabetic.  He tried drinking something when he felt bad in case his sugar was low.  He did gradually start to feel better but it took several hours.  He states that he previously felt like this when his potassium was low. Past Medical History:  Diagnosis Date  . Abdominal pain   . Alcoholic hepatitis without ascites   . Anemia   . Elevated bilirubin   . Essential hypertension   . Psoriasis   . Transaminitis    Past Surgical History:  Procedure Laterality Date  . BIOPSY  07/20/2019    Procedure: BIOPSY;  Surgeon: Daneil Dolin, MD;  [facility];  Service: Endoscopy;;  . BIOPSY  08/14/2019   Procedure: BIOPSY;  Surgeon: Thornton Park, MD;  Location: [place];  Service: Gastroenterology;;  . COLONOSCOPY WITH PROPOFOL N/A 07/20/2019   Procedure: COLONOSCOPY WITH PROPOFOL;  Surgeon: Daneil Dolin, MD;  [facility];  Service: Endoscopy;  Laterality: N/A;  10:15am  . COLONOSCOPY WITH PROPOFOL N/A 08/14/2019   Procedure: COLONOSCOPY WITH PROPOFOL;  Surgeon: Thornton Park, MD;  Location: [place];  Service: Gastroenterology;  Laterality: N/A;  . IR PARACENTESIS  08/14/2019  . POLYPECTOMY  07/20/2019   Procedure: POLYPECTOMY;  Surgeon: Daneil Dolin, MD;  [facility];  Service: Endoscopy;;  . SUBMUCOSAL TATTOO INJECTION  08/14/2019   Procedure: SUBMUCOSAL TATTOO INJECTION;  Surgeon: Thornton Park, MD;  Location: [place];  Service: Gastroenterology;;   Current Outpatient Medications on File Prior to Visit  Medication Sig Dispense Refill  . [place] Oil-Levomenthol (FDGARD) 25-20.75 MG CAPS Take as needed 16 capsule 0  . folic acid (FOLVITE) 1 MG tablet Take 1 tablet (1 mg total) by mouth daily.    . furosemide (LASIX) 20 MG tablet TAKE 1 TABLET(20 MG) BY MOUTH DAILY 30 tablet 2  .  hydrocortisone 2.5 % cream Apply pea sized amount rectally BID PRN- please dispense with rectal tip 30 g 0  . lactulose (CHRONULAC) 10 GM/15ML solution TAKE 15 MILLILITERS BY MOUTH BID-REDUCE OR STOP TAKING IF MORE THAN 3 BOWEL MOVEMENTS A DAY 946 mL 0  . mometasone (ELOCON) 0.1 % cream APPLY EXTERNALLY TO THE AFFECTED AREA EVERY DAY 45 g 2  . Multiple Vitamin (MULTIVITAMIN WITH MINERALS) TABS tablet Take 1 tablet by mouth daily.    . rifaximin (XIFAXAN) 550 MG TABS tablet Take 1 tablet (550 mg total) by mouth 2 (two) times daily. 60 tablet 0   No current facility-administered medications on file prior to visit.   No Known Allergies Social History    Socioeconomic History  . Marital status: Married    Spouse name: Not on file  . Number of children: Not on file  . Years of education: Not on file  . Highest education level: Not on file  Occupational History  . Not on file  Tobacco Use  . Smoking status: Current Every Day Smoker    Packs/day: 0.33    Types: Cigarettes  . Smokeless tobacco: Never Used  Vaping Use  . Vaping Use: Never used  Substance and Sexual Activity  . Alcohol use: Yes    Alcohol/week: 1.0 standard drink    Types: 1 Cans of beer per week  . Drug use: No  . Sexual activity: Yes  Other Topics Concern  . Not on file  Social History Narrative  . Not on file   Social Determinants of Health   Financial Resource Strain: Not on file  Food Insecurity: Not on file  Transportation Needs: Not on file  Physical Activity: Not on file  Stress: Not on file  Social Connections: Not on file  Intimate Partner Violence: Not on file     Review of Systems  All other systems reviewed and are negative.      Objective:   Physical Exam Vitals reviewed.  Constitutional:      Appearance: He is well-developed.  Neck:     Vascular: No JVD.  Cardiovascular:     Rate and Rhythm: Normal rate and regular rhythm.     Heart sounds: Normal heart sounds. No murmur heard. No friction rub. No gallop.   Pulmonary:     Effort: Pulmonary effort is normal. No respiratory distress.     Breath sounds: Normal breath sounds. No wheezing or rales.  Abdominal:     General: Bowel sounds are normal. There is no distension.     Palpations: Abdomen is soft. There is no mass.     Tenderness: There is no abdominal tenderness. There is no guarding or rebound.  Musculoskeletal:     Cervical back: Neck supple.  Lymphadenopathy:     Cervical: No cervical adenopathy.  Neurological:     Mental Status: He is alert and oriented to person, place, and time.     Cranial Nerves: No cranial nerve deficit.     Motor: No abnormal muscle tone.      Coordination: Coordination normal.     Deep Tendon Reflexes: Reflexes are normal and symmetric.           Assessment & Plan:  Episode of shaking - Plan: Hemoglobin A1c, CBC with Differential/Platelet, COMPLETE METABOLIC PANEL WITH GFR, Ammonia, TSH   I am not certain what caused his episode of shaking yesterday.  Some of his symptoms sound like hypoglycemia.  Some symptoms sound like a panic attack.  Therefore I need to obtain lab work.  I will check a CBC, CMP to evaluate for any hypokalemia, and ammonia level to check for possible risk of developing hepatic encephalopathy, TSH to check for hyperthyroidism, and an A1c to evaluate for any evidence of underlying diabetes.  Meanwhile we will try Ativan 0.5 mg p.o. every 12 hours as needed panic attack.  If the Ativan helps calm down the tremor and the anxiety and the symptoms go away and the lab work is normal, I believe this was most likely a panic attack.  If his ammonia level is elevated, I will have him resume the lactulose and titrate to achieve 2 bowel movements a day as some of this could be due to toxic myoclonus.  If all the lab work is normal and Ativan does not help, I would recommend a neurology consultation to evaluate for causes of myoclonus (possible EEG).  He is currently not taking any medications.

## 2020-04-06 LAB — CBC WITH DIFFERENTIAL/PLATELET
Absolute Monocytes: 1068 cells/uL — ABNORMAL HIGH (ref 200–950)
Basophils Absolute: 48 cells/uL (ref 0–200)
Basophils Relative: 0.7 %
Eosinophils Absolute: 20 cells/uL (ref 15–500)
Eosinophils Relative: 0.3 %
HCT: 52.5 % — ABNORMAL HIGH (ref 38.5–50.0)
Hemoglobin: 18.4 g/dL — ABNORMAL HIGH (ref 13.2–17.1)
Lymphs Abs: 1482 cells/uL (ref 850–3900)
MCH: 33.1 pg — ABNORMAL HIGH (ref 27.0–33.0)
MCHC: 35 g/dL (ref 32.0–36.0)
MCV: 94.4 fL (ref 80.0–100.0)
MPV: 10.1 fL (ref 7.5–12.5)
Monocytes Relative: 15.7 %
Neutro Abs: 4182 cells/uL (ref 1500–7800)
Neutrophils Relative %: 61.5 %
Platelets: 192 10*3/uL (ref 140–400)
RBC: 5.56 10*6/uL (ref 4.20–5.80)
RDW: 14.3 % (ref 11.0–15.0)
Total Lymphocyte: 21.8 %
WBC: 6.8 10*3/uL (ref 3.8–10.8)

## 2020-04-06 LAB — COMPLETE METABOLIC PANEL WITH GFR
AG Ratio: 1.5 (calc) (ref 1.0–2.5)
ALT: 38 U/L (ref 9–46)
AST: 49 U/L — ABNORMAL HIGH (ref 10–40)
Albumin: 4.7 g/dL (ref 3.6–5.1)
Alkaline phosphatase (APISO): 158 U/L — ABNORMAL HIGH (ref 36–130)
BUN: 7 mg/dL (ref 7–25)
CO2: 25 mmol/L (ref 20–32)
Calcium: 10 mg/dL (ref 8.6–10.3)
Chloride: 99 mmol/L (ref 98–110)
Creat: 0.69 mg/dL (ref 0.60–1.35)
GFR, Est African American: 130 mL/min/{1.73_m2} (ref >=60)
GFR, Est Non African American: 112 mL/min/{1.73_m2} (ref >=60)
Globulin: 3.1 g/dL (calc) (ref 1.9–3.7)
Glucose, Bld: 138 mg/dL — ABNORMAL HIGH (ref 65–99)
Potassium: 4.2 mmol/L (ref 3.5–5.3)
Sodium: 136 mmol/L (ref 135–146)
Total Bilirubin: 1.5 mg/dL — ABNORMAL HIGH (ref 0.2–1.2)
Total Protein: 7.8 g/dL (ref 6.1–8.1)

## 2020-04-06 LAB — HEMOGLOBIN A1C
Hgb A1c MFr Bld: 5.7 % of total Hgb — ABNORMAL HIGH (ref <5.7)
Mean Plasma Glucose: 117 mg/dL
eAG (mmol/L): 6.5 mmol/L

## 2020-04-06 LAB — TSH: TSH: 1.72 mIU/L (ref 0.40–4.50)

## 2020-04-06 LAB — AMMONIA: Ammonia: 75 umol/L — ABNORMAL HIGH (ref <=72)

## 2020-04-14 ENCOUNTER — Ambulatory Visit (INDEPENDENT_AMBULATORY_CARE_PROVIDER_SITE_OTHER): Payer: 59 | Admitting: Dermatology

## 2020-04-14 ENCOUNTER — Other Ambulatory Visit: Payer: Self-pay

## 2020-04-14 DIAGNOSIS — Z8719 Personal history of other diseases of the digestive system: Secondary | ICD-10-CM

## 2020-04-14 DIAGNOSIS — I781 Nevus, non-neoplastic: Secondary | ICD-10-CM

## 2020-04-14 DIAGNOSIS — L409 Psoriasis, unspecified: Secondary | ICD-10-CM | POA: Diagnosis not present

## 2020-04-14 DIAGNOSIS — L650 Telogen effluvium: Secondary | ICD-10-CM

## 2020-04-14 MED ORDER — ENSTILAR 0.005-0.064 % EX FOAM: 1.0000 "application " | CUTANEOUS | 4 refills | Status: DC

## 2020-04-14 NOTE — Progress Notes (Signed)
   New Patient Visit  Subjective  Christopher Carrillo is a 50 y.o. male who presents for the following: Alopecia (Abdomen, chest, arms, legs, ~29m started when patient was in hospital for the following://Pt got sick showed low potassium which was treated, then had Ct which showed mass on colon, then had colonoscopy and had liver failure 2 days later.  Pt currently being treated by Dr. BTarri Glennfor liver issues.).  New patient referral from Dr. KVic Blackbird  The following portions of the chart were reviewed this encounter and updated as appropriate:   Tobacco  Allergies  Meds  Problems  Med Hx  Surg Hx  Fam Hx      Review of Systems:  No other skin or systemic complaints except as noted in HPI or Assessment and Plan.  Objective  Well appearing patient in no apparent distress; mood and affect are within normal limits.  A focused examination was performed including trunk, extremities. Relevant physical exam findings are noted in the Assessment and Plan.  Objective  Chest - Medial ([facility]: Telangiectasias chest  Images      Objective  chest, abdomen: Hair loss chest and abdomen, telangiectasias chest  Images      Objective  elbows: Psoriatic plaque L elbow   Assessment & Plan    Hemangiomas - Red papules - Discussed benign nature - Observe - Call for any changes  Telangiectasia Chest - Medial (Center)  2ndary to hx of liver problems Benign, Discussed IPL/ laser treatments  Telogen effluvium chest, abdomen With onset of telangiectasias chest and varices legs 2ndary to recent liver problems Pt got sick showed low potassium which was treated, then had Ct which showed mass on colon, then had colonoscopy and had liver failure 2 days later.  Pt currently being treated by Dr. BTarri Glennfor liver issues. Discussed condition, benign, observe  Psoriasis elbows Psoriasis is a chronic non-curable, but treatable genetic/hereditary disease that may have other  systemic features affecting other organ systems such as joints (Psoriatic Arthritis). It is associated with an increased risk of inflammatory bowel disease, heart disease, non-alcoholic fatty liver disease, and depression.     Start Enstilar foam qd 5d/wk until clear, then prn flares  Calcipotriene-Betameth Diprop (ENSTILAR) 0.005-0.064 % FOAM - elbows  Return in about 6 months (around 10/12/2020) for Telogen effluvium.   I, SOthelia Pulling RMA, am acting as scribe for DSarina Ser MD .  Documentation: I have reviewed the above documentation for accuracy and completeness, and I agree with the above.  DSarina Ser MD

## 2020-04-26 ENCOUNTER — Encounter: Payer: Self-pay | Admitting: Dermatology

## 2020-05-04 ENCOUNTER — Encounter: Payer: Self-pay | Admitting: Nurse Practitioner

## 2020-05-04 ENCOUNTER — Other Ambulatory Visit: Payer: Self-pay

## 2020-05-04 ENCOUNTER — Ambulatory Visit: Payer: 59 | Admitting: Internal Medicine

## 2020-05-04 ENCOUNTER — Telehealth (INDEPENDENT_AMBULATORY_CARE_PROVIDER_SITE_OTHER): Payer: 59 | Admitting: Nurse Practitioner

## 2020-05-04 DIAGNOSIS — R519 Headache, unspecified: Secondary | ICD-10-CM | POA: Diagnosis not present

## 2020-05-04 NOTE — Progress Notes (Signed)
Subjective:    Patient ID: Christopher Carrillo, male    DOB: 14-May-1970, 50 y.o.   MRN: [id number]  HPI: Deontay Ladnier is a 50 y.o. male presenting virtually for a fever and a cold.  Chief Complaint  Patient presents with  . Headache    With congestion x 1 day   UPPER RESPIRATORY TRACT INFECTION Onset: 1 day Fever: yes; 100.0 Cough: no Shortness of breath: no Wheezing: no Chest pain: no Chest tightness: no Chest congestion: no Nasal congestion: yes Runny nose: yes Post nasal drip: no  Sneezing: no Sore throat: no Swollen glands: no Sinus pressure: no Headache: yes Face pain: no Toothache: no Ear pain: no  Ear pressure: no  Eyes red/itching:no Eye drainage/crusting: no  Nausea: no  Vomiting: no Diarrhea: no  Change in appetite: no  Loss of taste/smell: no  Rash: no Fatigue: yes Sick contacts: yes; 2 coworkers have tested positive over weekend Strep contacts: no  Context: better Recurrent sinusitis: no Treatments attempted: nothing Relief with OTC medications: n/a  No Known Allergies  Outpatient Encounter Medications as of 05/04/2020  Medication Sig  . Calcipotriene-Betameth Diprop (ENSTILAR) 0.005-0.064 % FOAM Apply 1 application topically as directed. Qd up to 5 days a week to aa psoriasis elbows until clear then prn flares  . Car[place] Oil-Levomenthol (FDGARD) 25-20.75 MG CAPS Take as needed  . folic acid (FOLVITE) 1 MG tablet Take 1 tablet (1 mg total) by mouth daily.  . furosemide (LASIX) 20 MG tablet TAKE 1 TABLET(20 MG) BY MOUTH DAILY  . hydrocortisone 2.5 % cream Apply pea sized amount rectally BID PRN- please dispense with rectal tip  . lactulose (CHRONULAC) 10 GM/15ML solution TAKE 15 MILLILITERS BY MOUTH BID-REDUCE OR STOP TAKING IF MORE THAN 3 BOWEL MOVEMENTS A DAY  . LORazepam (ATIVAN) 0.5 MG tablet Take 1 tablet (0.5 mg total) by mouth 2 (two) times daily as needed for anxiety.  . mometasone (ELOCON) 0.1 % cream APPLY EXTERNALLY TO THE AFFECTED  AREA EVERY DAY  . Multiple Vitamin (MULTIVITAMIN WITH MINERALS) TABS tablet Take 1 tablet by mouth daily.  . rifaximin (XIFAXAN) 550 MG TABS tablet Take 1 tablet (550 mg total) by mouth 2 (two) times daily.   No facility-administered encounter medications on file as of 05/04/2020.    Patient Active Problem List   Diagnosis Date Noted  . Polyarthralgia 01/08/2020  . History of diverticulosis 10/28/2019  . Internal and external bleeding hemorrhoids 08/20/2019  . Colon ulcer   . Hematochezia 08/12/2019  . Leukocytosis 08/12/2019  . Alcoholic cirrhosis ([place])   . Coagulopathy ([place])   . Lower extremity edema 07/29/2019  . Urinary hesitancy 07/29/2019  . Anemia of chronic disease 07/29/2019  . Drug-induced hepatic toxicity 07/25/2019  . Elevated bilirubin   . Elevated LFTs 07/14/2019  . Hepatic steatosis 02/18/2019  . GAD (generalized anxiety disorder) 02/18/2019  . GERD (gastroesophageal reflux disease) 04/08/2013  . Abdominal pain 04/07/2013  . Hyperlipidemia 12/19/2012  . Essential hypertension 12/19/2012  . Routine general medical examination at a health care facility 08/07/2012  . Psoriatic arthritis ([place]) 05/04/2012  . Obese 05/04/2012  . Tobacco use 05/04/2012    Past Medical History:  Diagnosis Date  . Abdominal pain   . Alcoholic hepatitis without ascites   . Anemia   . Elevated bilirubin   . Essential hypertension   . Psoriasis   . Transaminitis     Relevant past medical, surgical, family and social history reviewed and updated as indicated. Interim medical  history since our last visit reviewed.  Review of Systems Per HPI unless specifically indicated above     Objective:    There were no vitals taken for this visit.  Wt Readings from Last 3 Encounters:  04/05/20 195 lb (88.5 kg)  02/05/20 195 lb (88.5 kg)  02/05/20 196 lb (88.9 kg)    Physical Exam Nursing note reviewed.  Constitutional:      General: He is not in acute distress.    Appearance: He is  not ill-appearing or toxic-appearing.  HENT:     Head: Normocephalic and atraumatic.     Right Ear: External ear normal.     Left Ear: External ear normal.     Nose: Nose normal. No congestion or rhinorrhea.     Mouth/Throat:     Mouth: Mucous membranes are moist.     Pharynx: Oropharynx is clear.  Eyes:     General: No scleral icterus.    Extraocular Movements: Extraocular movements intact.  Cardiovascular:     Comments: Unable to assess heart sounds via virtual visit. Pulmonary:     Effort: Pulmonary effort is normal. No respiratory distress.     Comments: Unable to assess lung sounds via virtual visit.  Patient talking in complete sentences during virtual visit without accessory muscle use. Skin:    Coloration: Skin is not jaundiced or pale.     Findings: No erythema.  Neurological:     General: No focal deficit present.     Mental Status: He is alert and oriented to person, place, and time.     Motor: No weakness.  Psychiatric:        Mood and Affect: Mood normal.        Behavior: Behavior normal.        Thought Content: Thought content normal.        Judgment: Judgment normal.        Assessment & Plan:  1. Acute nonintractable headache, unspecified headache type Acute, ongoing.  Coupled with congestion, likely viral in etiology.  Will obtain COVID testing.  Reassured patient that symptoms and exam findings are most consistent with a viral upper respiratory infection and explained lack of efficacy of antibiotics against viruses.  Discussed expected course and features suggestive of secondary bacterial infection.  Continue supportive care. Increase fluid intake with water or electrolyte solution like pedialyte. Encouraged acetaminophen as needed for fever/pain. Encouraged salt water gargling, chloraseptic spray and throat lozenges. Encouraged OTC guaifenesin. Encouraged saline sinus flushes and/or neti with humidified air.  With any sudden onset new chest pain, dizziness,  sweating, or shortness of breath, go to ED.  - SARS-COV-2 RNA,(COVID-19) QUAL NAAT    Follow up plan: Return if symptoms worsen or fail to improve.  Due to the catastrophic nature of the COVID-19 pandemic, this visit was completed via audio and visual contact via Caregility due to the restrictions of the COVID-19 pandemic. All issues as above were discussed and addressed. Physical exam was done as above through visual confirmation on Caregility. If it was felt that the patient should be evaluated in the office, they were directed there. The patient verbally consented to this visit."} . Location of the patient: home . Location of the provider: work . Those involved with this call:  . Provider: Carnella Guadalajara, DNP . CMA: n/a . Front Desk/Registration: Jeralene Peters  . Time spent on call: 15 minutes with patient face to face via video conference. More than 50% of this time was spent in counseling  and coordination of care. 30 minutes total spent in review of patient's record and preparation of their chart.  I verified patient identity using two factors (patient name and date of birth). Patient consents verbally to being seen via telemedicine visit today.

## 2020-05-04 NOTE — Patient Instructions (Signed)

## 2020-05-04 NOTE — Progress Notes (Deleted)
Office Visit Note  Patient: Christopher Carrillo             Date of Birth: 1970/12/13           MRN: [id number]             PCP: Alycia Rossetti, MD Referring: Alycia Rossetti, MD Visit Date: 05/04/2020   Subjective:  No chief complaint on file.   History of Present Illness: Christopher Carrillo is a 50 y.o. male ***    No Rheumatology ROS completed.   PMFS History:  Patient Active Problem List   Diagnosis Date Noted  . Polyarthralgia 01/08/2020  . History of diverticulosis 10/28/2019  . Internal and external bleeding hemorrhoids 08/20/2019  . Colon ulcer   . Hematochezia 08/12/2019  . Leukocytosis 08/12/2019  . Alcoholic cirrhosis ([place])   . Coagulopathy ([place])   . Lower extremity edema 07/29/2019  . Urinary hesitancy 07/29/2019  . Anemia of chronic disease 07/29/2019  . Drug-induced hepatic toxicity 07/25/2019  . Elevated bilirubin   . Elevated LFTs 07/14/2019  . Hepatic steatosis 02/18/2019  . GAD (generalized anxiety disorder) 02/18/2019  . GERD (gastroesophageal reflux disease) 04/08/2013  . Abdominal pain 04/07/2013  . Hyperlipidemia 12/19/2012  . Essential hypertension 12/19/2012  . Routine general medical examination at a health care facility 08/07/2012  . Psoriatic arthritis ([place]) 05/04/2012  . Obese 05/04/2012  . Tobacco use 05/04/2012    Past Medical History:  Diagnosis Date  . Abdominal pain   . Alcoholic hepatitis without ascites   . Anemia   . Elevated bilirubin   . Essential hypertension   . Psoriasis   . Transaminitis     Family History  Problem Relation Age of Onset  . Asthma Father   . Cancer Sister 18       Pharyngeal  . Colon cancer Neg Hx   . Liver disease Neg Hx    Past Surgical History:  Procedure Laterality Date  . BIOPSY  07/20/2019   Procedure: BIOPSY;  Surgeon: Daneil Dolin, MD;  [facility];  Service: Endoscopy;;  . BIOPSY  08/14/2019   Procedure: BIOPSY;  Surgeon: Thornton Park, MD;  Location: [facility];  Service: Gastroenterology;;  . COLONOSCOPY WITH PROPOFOL N/A 07/20/2019   Procedure: COLONOSCOPY WITH PROPOFOL;  Surgeon: Daneil Dolin, MD;  [facility];  Service: Endoscopy;  Laterality: N/A;  10:15am  . COLONOSCOPY WITH PROPOFOL N/A 08/14/2019   Procedure: COLONOSCOPY WITH PROPOFOL;  Surgeon: Thornton Park, MD;  Location: [facility];  Service: Gastroenterology;  Laterality: N/A;  . IR PARACENTESIS  08/14/2019  . POLYPECTOMY  07/20/2019   Procedure: POLYPECTOMY;  Surgeon: Daneil Dolin, MD;  [facility];  Service: Endoscopy;;  . SUBMUCOSAL TATTOO INJECTION  08/14/2019   Procedure: SUBMUCOSAL TATTOO INJECTION;  Surgeon: Thornton Park, MD;  Location: [facility] ENDOSCOPY;  Service: Gastroenterology;;   Social History   Social History Narrative  . Not on file   Immunization History  Administered Date(s) Administered  . Hep A / Hep B 12/11/2019  . Moderna Sars-Covid-2 Vaccination 06/26/2019  . Tdap 11/08/2014     Objective: Vital Signs: There were no vitals taken for this visit.   Physical Exam   Musculoskeletal Exam: ***  CDAI Exam: CDAI Score: - Patient Global: -; Provider Global: - Swollen: -; Tender: - Joint Exam 05/04/2020   No joint exam has been documented for this visit   There is currently no information documented on the homunculus. Go  to the Rheumatology activity and complete the homunculus joint exam.  Investigation: No additional findings.  Imaging: No results found.  Recent Labs: Lab Results  Component Value Date   WBC 6.8 04/05/2020   HGB 18.4 (H) 04/05/2020   PLT 192 04/05/2020   NA 136 04/05/2020   K 4.2 04/05/2020   CL 99 04/05/2020   CO2 25 04/05/2020   GLUCOSE 138 (H) 04/05/2020   BUN 7 04/05/2020   CREATININE 0.69 04/05/2020   BILITOT 1.5 (H) 04/05/2020   ALKPHOS 145 (H) 02/05/2020   AST 49 (H) 04/05/2020   ALT 38 04/05/2020   PROT 7.8 04/05/2020   ALBUMIN 4.4 02/05/2020   CALCIUM 10.0 04/05/2020    GFRAA 130 04/05/2020    Speciality Comments: No specialty comments available.  Procedures:  No procedures performed Allergies: Patient has no known allergies.   Assessment / Plan:     Visit Diagnoses: No diagnosis found.  Orders: No orders of the defined types were placed in this encounter.  No orders of the defined types were placed in this encounter.   Face-to-face time spent with patient was *** minutes. Greater than 50% of time was spent in counseling and coordination of care.  Follow-Up Instructions: No follow-ups on file.   Collier Salina, MD  Note - This record has been created using [employer].  Chart creation errors have been sought, but may not always  have been located. Such creation errors do not reflect on  the standard of medical care.

## 2020-05-06 LAB — SARS-COV-2 RNA,(COVID-19) QUALITATIVE NAAT: SARS CoV2 RNA: NOT DETECTED

## 2020-05-16 NOTE — Progress Notes (Signed)
Office Visit Note  Patient: Christopher Carrillo             Date of Birth: 12/01/1970           MRN: [id number]             PCP: Alycia Rossetti, MD Referring: Alycia Rossetti, MD Visit Date: 05/17/2020  Subjective:  Follow-up   History of Present Illness: Christopher Carrillo is a 50 y.o. male here for follow up of psoriatic arthritis. He has had some increase in joint stiffness and skin dryness with the winter months, but overall similar to last visit. Currently has skin rash over the left elbow but not much elsewhere. Shoulders, hips, and feet are most painful issues.  Review of Systems  Constitutional: Positive for fatigue.  HENT: Positive for mouth dryness. Negative for mouth sores and nose dryness.   Eyes: Negative for pain, itching, visual disturbance and dryness.  Respiratory: Negative for cough, hemoptysis, shortness of breath and difficulty breathing.   Cardiovascular: Negative for chest pain, palpitations and swelling in legs/feet.  Gastrointestinal: Negative for abdominal pain, blood in stool, constipation and diarrhea.  Endocrine: Negative for increased urination.  Genitourinary: Negative for painful urination.  Musculoskeletal: Positive for arthralgias, joint pain, myalgias, muscle weakness, morning stiffness, muscle tenderness and myalgias. Negative for joint swelling.  Skin: Negative for color change, rash and redness.  Allergic/Immunologic: Negative for susceptible to infections.  Neurological: Positive for dizziness and weakness. Negative for numbness, headaches and memory loss.  Hematological: Positive for swollen glands.  Psychiatric/Behavioral: Positive for sleep disturbance. Negative for confusion.    PMFS History:  Patient Active Problem List   Diagnosis Date Noted  . Polyarthralgia 01/08/2020  . History of diverticulosis 10/28/2019  . Internal and external bleeding hemorrhoids 08/20/2019  . Colon ulcer   . Hematochezia 08/12/2019  . Leukocytosis  08/12/2019  . Alcoholic cirrhosis ([place])   . Coagulopathy ([place])   . Lower extremity edema 07/29/2019  . Urinary hesitancy 07/29/2019  . Anemia of chronic disease 07/29/2019  . Drug-induced hepatic toxicity 07/25/2019  . Elevated bilirubin   . Elevated LFTs 07/14/2019  . Hepatic steatosis 02/18/2019  . GAD (generalized anxiety disorder) 02/18/2019  . GERD (gastroesophageal reflux disease) 04/08/2013  . Abdominal pain 04/07/2013  . Hyperlipidemia 12/19/2012  . Essential hypertension 12/19/2012  . Routine general medical examination at a health care facility 08/07/2012  . Psoriatic arthritis ([place]) 05/04/2012  . Obese 05/04/2012  . Tobacco use 05/04/2012    Past Medical History:  Diagnosis Date  . Abdominal pain   . Alcoholic hepatitis without ascites   . Anemia   . Elevated bilirubin   . Essential hypertension   . Psoriasis   . Transaminitis     Family History  Problem Relation Age of Onset  . Asthma Father   . Cancer Sister 18       Pharyngeal  . Colon cancer Neg Hx   . Liver disease Neg Hx    Past Surgical History:  Procedure Laterality Date  . BIOPSY  07/20/2019   Procedure: BIOPSY;  Surgeon: Daneil Dolin, MD;  [facility];  Service: Endoscopy;;  . BIOPSY  08/14/2019   Procedure: BIOPSY;  Surgeon: Thornton Park, MD;  Location: [facility];  Service: Gastroenterology;;  . COLONOSCOPY WITH PROPOFOL N/A 07/20/2019   Procedure: COLONOSCOPY WITH PROPOFOL;  Surgeon: Daneil Dolin, MD;  [facility];  Service: Endoscopy;  Laterality: N/A;  10:15am  . COLONOSCOPY WITH PROPOFOL N/A  08/14/2019   Procedure: COLONOSCOPY WITH PROPOFOL;  Surgeon: Thornton Park, MD;  Location: [facility];  Service: Gastroenterology;  Laterality: N/A;  . IR PARACENTESIS  08/14/2019  . POLYPECTOMY  07/20/2019   Procedure: POLYPECTOMY;  Surgeon: Daneil Dolin, MD;  [facility];  Service: Endoscopy;;  . SUBMUCOSAL TATTOO INJECTION  08/14/2019   Procedure:  SUBMUCOSAL TATTOO INJECTION;  Surgeon: Thornton Park, MD;  Location[facility] ENDOSCOPY;  Service: Gastroenterology;;   Social History   Social History Narrative  . Not on file   Immunization History  Administered Date(s) Administered  . Hep A / Hep B 12/11/2019  . Moderna Sars-Covid-2 Vaccination 06/26/2019, 10/08/2019  . Tdap 11/08/2014     Objective: Vital Signs: BP 109/73 (BP Location: Right Arm, Patient Position: Sitting, Cuff Size: Normal)   Pulse 96   Ht _0  (1.727 m)   Wt 203 lb 3.2 oz (92.2 kg)   BMI 30.90 kg/m    Physical Exam HENT:     Right Ear: External ear normal.     Left Ear: External ear normal.  Eyes:     Conjunctiva/sclera: Conjunctivae normal.  Skin:    General: Skin is warm and dry.     Findings: Rash present.     Comments: Left elbow plaque without scale Periumbilical rash Few scattered telangiectasias, palmar erythema  Neurological:     Mental Status: He is alert.    Musculoskeletal Exam:  Neck full ROM no tenderness Shoulders full ROM bilateral stiffness and tenderness no swelling Elbows full ROM no tenderness or swelling Wrists full ROM stiffness and tenderness present no swelling Fingers full ROM no tenderness or swelling Knees full ROM no tenderness or swelling Ankles full ROM no tenderness or swelling   Investigation: No additional findings.  Imaging: No results found.  Recent Labs: Lab Results  Component Value Date   WBC 6.8 04/05/2020   HGB 18.4 (H) 04/05/2020   PLT 192 04/05/2020   NA 136 04/05/2020   K 4.2 04/05/2020   CL 99 04/05/2020   CO2 25 04/05/2020   GLUCOSE 138 (H) 04/05/2020   BUN 7 04/05/2020   CREATININE 0.69 04/05/2020   BILITOT 1.5 (H) 04/05/2020   ALKPHOS 145 (H) 02/05/2020   AST 49 (H) 04/05/2020   ALT 38 04/05/2020   PROT 7.8 04/05/2020   ALBUMIN 4.4 02/05/2020   CALCIUM 10.0 04/05/2020   GFRAA 130 04/05/2020    Speciality Comments: No specialty comments available.  Procedures:  No  procedures performed Allergies: Patient has no known allergies.   Assessment / Plan:     Visit Diagnoses: Psoriatic arthritis ([place]) - Plan: Apremilast 10 & 20 & 30 MG TBPK, Apremilast 30 MG TABS  Suspect PsA component of his multiple areas with joint pain and stiffness. Not a candidate for methotrexate, arava, and caution with bDMARDs. Recommend starting apremilast for joint disease, skin is not too badly involved at present. History of depression episode but patient feels this was more related to his liver disease and other health problems and never SI, discussed risk of medication. Seronegative and not significantly elevated inflammatory markers so will need to follow clinical response to treatment.  Alcoholic cirrhosis of liver without ascites ([place])  Limitation on medication treatment options. Also he is on rifaxamin discussed possible diarrhea on apremilast and to monitor for dose adjustment if needed.  Orders: No orders of the defined types were placed in this encounter.  Meds ordered this encounter  Medications  . Apremilast 10 & 20 &  30 MG TBPK    Sig: Day 1: 10 mg in morning Day 2: 10 mg in morning and 10 mg in evening Day 3: 10 mg in morning and 20 mg in evening Day 4: 20 mg in morning and 20 mg in evening Day 5: 20 mg in morning and 30 mg in evening Day 6: 30 mg in morning and 30 mg in evening    Dispense:  55 each    Refill:  0  . Apremilast 30 MG TABS    Sig: Take 1 tablet (30 mg total) by mouth in the morning and at bedtime.    Dispense:  60 tablet    Refill:  1    To begin after initial starting kit titration    Follow-Up Instructions: Return in about 8 weeks (around 07/12/2020) for PsA f/u starting otezla.   Collier Salina, MD  Note - This record has been created using [employer].  Chart creation errors have been sought, but may not always  have been located. Such creation errors do not reflect on  the standard of medical care.

## 2020-05-17 ENCOUNTER — Other Ambulatory Visit: Payer: Self-pay | Admitting: Family Medicine

## 2020-05-17 ENCOUNTER — Ambulatory Visit: Payer: 59 | Admitting: Internal Medicine

## 2020-05-17 ENCOUNTER — Other Ambulatory Visit: Payer: Self-pay

## 2020-05-17 ENCOUNTER — Encounter: Payer: Self-pay | Admitting: Internal Medicine

## 2020-05-17 ENCOUNTER — Telehealth: Payer: Self-pay | Admitting: Pharmacy Technician

## 2020-05-17 VITALS — BP 109/73 | HR 96 | Ht 68.0 in | Wt 203.2 lb

## 2020-05-17 DIAGNOSIS — L4050 Arthropathic psoriasis, unspecified: Secondary | ICD-10-CM | POA: Diagnosis not present

## 2020-05-17 DIAGNOSIS — M2550 Pain in unspecified joint: Secondary | ICD-10-CM | POA: Diagnosis not present

## 2020-05-17 DIAGNOSIS — K7030 Alcoholic cirrhosis of liver without ascites: Secondary | ICD-10-CM | POA: Diagnosis not present

## 2020-05-17 DIAGNOSIS — R69 Illness, unspecified: Secondary | ICD-10-CM | POA: Diagnosis not present

## 2020-05-17 DIAGNOSIS — L409 Psoriasis, unspecified: Secondary | ICD-10-CM

## 2020-05-17 MED ORDER — APREMILAST 30 MG PO TABS: 30.0000 mg | ORAL_TABLET | Freq: Two times a day (BID) | ORAL | 1 refills | Status: DC

## 2020-05-17 MED ORDER — APREMILAST 10 & 20 & 30 MG PO TBPK: ORAL_TABLET | ORAL | 0 refills | Status: DC

## 2020-05-17 NOTE — Telephone Encounter (Signed)
-----   Message from Ron Agee, RT sent at 05/17/2020  2:51 PM EST ----- Regarding: [place] Nail Please apply for [facility] per Dr. Benjamine Mola. Thanks!

## 2020-05-17 NOTE — Telephone Encounter (Signed)
Submitted a Prior Authorization request to [facility] for [place] via Cover My Meds. Will update once we receive a response.   (Key: [id number]

## 2020-05-18 MED ORDER — LORAZEPAM 0.5 MG PO TABS: 0.5000 mg | ORAL_TABLET | Freq: Two times a day (BID) | ORAL | 0 refills | Status: DC | PRN

## 2020-05-23 MED ORDER — APREMILAST 10 & 20 & 30 MG PO TBPK: ORAL_TABLET | ORAL | 0 refills | Status: DC

## 2020-05-23 MED ORDER — APREMILAST 30 MG PO TABS: 30.0000 mg | ORAL_TABLET | Freq: Two times a day (BID) | ORAL | 1 refills | Status: DC

## 2020-05-23 NOTE — Telephone Encounter (Signed)
Rx sent to [facility] [phone]). Copay card emailed to email on file.    Called patient to advise. Left VM with pharmacy phone number and that copay was sent to email.  Knox Saliva, PharmD, MPH Clinical Pharmacist (Rheumatology and Pulmonology)

## 2020-05-23 NOTE — Telephone Encounter (Signed)
Received notification from [place] regarding a prior authorization for [facility]. Authorization has been APPROVED from 05/20/20 to 05/20/21.   Authorization # [id number]- For starter pack and maintenance dose  Per plan, patient must fill through [facility]. Patient can use a copay card.   Please re-send in rx to appropriate pharmacy.

## 2020-06-10 ENCOUNTER — Other Ambulatory Visit: Payer: Self-pay | Admitting: Family Medicine

## 2020-06-20 ENCOUNTER — Other Ambulatory Visit: Payer: Self-pay

## 2020-06-20 ENCOUNTER — Encounter: Payer: Self-pay | Admitting: Family Medicine

## 2020-06-20 ENCOUNTER — Ambulatory Visit (INDEPENDENT_AMBULATORY_CARE_PROVIDER_SITE_OTHER): Payer: 59 | Admitting: Family Medicine

## 2020-06-20 VITALS — BP 116/76 | HR 88 | Temp 96.6°F | Ht 68.0 in

## 2020-06-20 DIAGNOSIS — K7030 Alcoholic cirrhosis of liver without ascites: Secondary | ICD-10-CM | POA: Diagnosis not present

## 2020-06-20 DIAGNOSIS — R5383 Other fatigue: Secondary | ICD-10-CM

## 2020-06-20 DIAGNOSIS — R69 Illness, unspecified: Secondary | ICD-10-CM | POA: Diagnosis not present

## 2020-06-20 NOTE — Progress Notes (Signed)
Subjective:    Patient ID: Christopher Carrillo, male    DOB: Feb 26, 1971, 50 y.o.   MRN: [id number]  HPI Patient is a very pleasant 50 year old male here today complaining of a multitude of vague symptoms.  He recently started [place] 2 weeks ago for psoriatic arthritis.  Since starting the medication he reports a dull constant headache.  He also reports feeling tired and fatigued.  He has some abdominal bloating and nausea.  Prior to starting the [place] he was having vertigo whenever he would lay flat on his back.  Whenever he sits up, the vertigo goes away and is not present however when he lays down at night he develops vertigo.  This is been going on for about 2 or 3 weeks.  Therefore his symptoms include fatigue, abdominal bloating, nausea, and a dull constant headache.  He also has vertigo which seems to predate the [place]. Past Medical History:  Diagnosis Date  . Abdominal pain   . Alcoholic hepatitis without ascites   . Anemia   . Elevated bilirubin   . Essential hypertension   . Psoriasis   . Transaminitis    Past Surgical History:  Procedure Laterality Date  . BIOPSY  07/20/2019   Procedure: BIOPSY;  Surgeon: Daneil Dolin, MD;  [facility];  Service: Endoscopy;;  . BIOPSY  08/14/2019   Procedure: BIOPSY;  Surgeon: Thornton Park, MD;  Location: [facility];  Service: Gastroenterology;;  . COLONOSCOPY WITH PROPOFOL N/A 07/20/2019   Procedure: COLONOSCOPY WITH PROPOFOL;  Surgeon: Daneil Dolin, MD;  [facility];  Service: Endoscopy;  Laterality: N/A;  10:15am  . COLONOSCOPY WITH PROPOFOL N/A 08/14/2019   Procedure: COLONOSCOPY WITH PROPOFOL;  Surgeon: Thornton Park, MD;  Location: [facility];  Service: Gastroenterology;  Laterality: N/A;  . IR PARACENTESIS  08/14/2019  . POLYPECTOMY  07/20/2019   Procedure: POLYPECTOMY;  Surgeon: Daneil Dolin, MD;  [facility];  Service: Endoscopy;;  . SUBMUCOSAL TATTOO INJECTION  08/14/2019   Procedure:  SUBMUCOSAL TATTOO INJECTION;  Surgeon: Thornton Park, MD;  Location: [facility];  Service: Gastroenterology;;   Current Outpatient Medications on File Prior to Visit  Medication Sig Dispense Refill  . Apremilast 10 & 20 & 30 MG TBPK Day 1: 10 mg in morning Day 2: 10 mg in morning and 10 mg in evening Day 3: 10 mg in morning and 20 mg in evening Day 4: 20 mg in morning and 20 mg in evening Day 5: 20 mg in morning and 30 mg in evening Day 6: 30 mg in morning and 30 mg in evening 55 each 0  . Apremilast 30 MG TABS Take 1 tablet (30 mg total) by mouth in the morning and at bedtime. 60 tablet 1  . furosemide (LASIX) 20 MG tablet TAKE 1 TABLET(20 MG) BY MOUTH DAILY 30 tablet 2  . hydrocortisone 2.5 % cream Apply pea sized amount rectally BID PRN- please dispense with rectal tip 30 g 0  . lactulose (CHRONULAC) 10 GM/15ML solution TAKE 15 MILLILITERS BY MOUTH TWICE DAILY-REDUCE OR STOP TAKING IF MORE THAN 3 BOWEL MOVEMENTS A DAY 946 mL 0  . LORazepam (ATIVAN) 0.5 MG tablet Take 1 tablet (0.5 mg total) by mouth 2 (two) times daily as needed for anxiety. 30 tablet 0  . mometasone (ELOCON) 0.1 % cream APPLY EXTERNALLY TO THE AFFECTED AREA EVERY DAY 45 g 2  . Multiple Vitamin (MULTIVITAMIN WITH MINERALS) TABS tablet Take 1 tablet by mouth daily.  No current facility-administered medications on file prior to visit.   No Known Allergies Social History   Socioeconomic History  . Marital status: Married    Spouse name: Not on file  . Number of children: Not on file  . Years of education: Not on file  . Highest education level: Not on file  Occupational History  . Not on file  Tobacco Use  . Smoking status: Current Every Day Smoker    Packs/day: 0.33    Types: Cigarettes  . Smokeless tobacco: Never Used  Vaping Use  . Vaping Use: Never used  Substance and Sexual Activity  . Alcohol use: Yes    Alcohol/week: 1.0 standard drink    Types: 1 Cans of beer per week  . Drug use: No  . Sexual  activity: Yes  Other Topics Concern  . Not on file  Social History Narrative  . Not on file   Social Determinants of Health   Financial Resource Strain: Not on file  Food Insecurity: Not on file  Transportation Needs: Not on file  Physical Activity: Not on file  Stress: Not on file  Social Connections: Not on file  Intimate Partner Violence: Not on file     Review of Systems  All other systems reviewed and are negative.      Objective:   Physical Exam Vitals reviewed.  Constitutional:      Appearance: He is well-developed.  Neck:     Vascular: No JVD.  Cardiovascular:     Rate and Rhythm: Normal rate and regular rhythm.     Heart sounds: Normal heart sounds. No murmur heard. No friction rub. No gallop.   Pulmonary:     Effort: Pulmonary effort is normal. No respiratory distress.     Breath sounds: Normal breath sounds. No wheezing or rales.  Abdominal:     General: Bowel sounds are normal. There is no distension.     Palpations: Abdomen is soft.     Tenderness: There is no abdominal tenderness.  Musculoskeletal:     Cervical back: Neck supple.  Lymphadenopathy:     Cervical: No cervical adenopathy.  Neurological:     Mental Status: He is alert and oriented to person, place, and time.     Cranial Nerves: No cranial nerve deficit.     Motor: No abnormal muscle tone.     Coordination: Coordination normal.     Deep Tendon Reflexes: Reflexes are normal and symmetric.           Assessment & Plan:  Fatigue, unspecified type - Plan: COMPLETE METABOLIC PANEL WITH GFR, Ammonia, CBC with Differential/Platelet  Alcoholic cirrhosis, unspecified whether ascites present ([place]) - Plan: COMPLETE METABOLIC PANEL WITH GFR, Ammonia, CBC with Differential/Platelet  Most of the symptoms particularly the GI symptoms could be associated with [place].  They also seem to begin around the same time.  The only symptom that does not coincide with the start of [place] would be the  vertigo which has symptoms consistent with BPPV.  Therefore I recommended that he hold the [facility] for 1 week and see if the symptoms subside.  If so, I would recommend discussing with his rheumatologist whether he wants to continue the medication or try different alternative.  I am concerned however given his history of alcoholic cirrhosis and therefore we will check a CBC, CMP, and ammonia level.  There is no asterixis on exam today.  The remainder of his exam looks relatively normal.

## 2020-06-21 LAB — COMPLETE METABOLIC PANEL WITH GFR
AG Ratio: 1.5 (calc) (ref 1.0–2.5)
ALT: 32 U/L (ref 9–46)
AST: 35 U/L (ref 10–40)
Albumin: 4.5 g/dL (ref 3.6–5.1)
Alkaline phosphatase (APISO): 188 U/L — ABNORMAL HIGH (ref 36–130)
BUN/Creatinine Ratio: 8 (calc) (ref 6–22)
BUN: 6 mg/dL — ABNORMAL LOW (ref 7–25)
CO2: 26 mmol/L (ref 20–32)
Calcium: 9.7 mg/dL (ref 8.6–10.3)
Chloride: 101 mmol/L (ref 98–110)
Creat: 0.76 mg/dL (ref 0.60–1.35)
GFR, Est African American: 125 mL/min/{1.73_m2} (ref >=60)
GFR, Est Non African American: 108 mL/min/{1.73_m2} (ref >=60)
Globulin: 3 g/dL (calc) (ref 1.9–3.7)
Glucose, Bld: 83 mg/dL (ref 65–99)
Potassium: 4.2 mmol/L (ref 3.5–5.3)
Sodium: 138 mmol/L (ref 135–146)
Total Bilirubin: 0.7 mg/dL (ref 0.2–1.2)
Total Protein: 7.5 g/dL (ref 6.1–8.1)

## 2020-06-21 LAB — CBC WITH DIFFERENTIAL/PLATELET
Absolute Monocytes: 1224 cells/uL — ABNORMAL HIGH (ref 200–950)
Basophils Absolute: 77 cells/uL (ref 0–200)
Basophils Relative: 0.9 %
Eosinophils Absolute: 94 cells/uL (ref 15–500)
Eosinophils Relative: 1.1 %
HCT: 49.7 % (ref 38.5–50.0)
Hemoglobin: 18.1 g/dL — ABNORMAL HIGH (ref 13.2–17.1)
Lymphs Abs: 1862 cells/uL (ref 850–3900)
MCH: 35.6 pg — ABNORMAL HIGH (ref 27.0–33.0)
MCHC: 36.4 g/dL — ABNORMAL HIGH (ref 32.0–36.0)
MCV: 97.8 fL (ref 80.0–100.0)
MPV: 10.4 fL (ref 7.5–12.5)
Monocytes Relative: 14.4 %
Neutro Abs: 5245 cells/uL (ref 1500–7800)
Neutrophils Relative %: 61.7 %
Platelets: 252 10*3/uL (ref 140–400)
RBC: 5.08 10*6/uL (ref 4.20–5.80)
RDW: 13.4 % (ref 11.0–15.0)
Total Lymphocyte: 21.9 %
WBC: 8.5 10*3/uL (ref 3.8–10.8)

## 2020-06-21 LAB — AMMONIA: Ammonia: 64 umol/L (ref <=72)

## 2020-07-12 ENCOUNTER — Ambulatory Visit: Payer: 59 | Admitting: Internal Medicine

## 2020-07-17 ENCOUNTER — Other Ambulatory Visit: Payer: Self-pay | Admitting: Family Medicine

## 2020-07-18 MED ORDER — LORAZEPAM 0.5 MG PO TABS: 0.5000 mg | ORAL_TABLET | Freq: Two times a day (BID) | ORAL | 0 refills | Status: DC | PRN

## 2020-07-18 NOTE — Telephone Encounter (Signed)
Ok to refill??  Last office visit 06/20/2020  Last refill 05/18/2020.

## 2020-08-09 DIAGNOSIS — K7030 Alcoholic cirrhosis of liver without ascites: Secondary | ICD-10-CM

## 2020-08-09 DIAGNOSIS — K7290 Hepatic failure, unspecified without coma: Secondary | ICD-10-CM | POA: Diagnosis not present

## 2020-08-09 DIAGNOSIS — G4700 Insomnia, unspecified: Secondary | ICD-10-CM | POA: Diagnosis not present

## 2020-08-09 DIAGNOSIS — R69 Illness, unspecified: Secondary | ICD-10-CM | POA: Diagnosis not present

## 2020-08-09 DIAGNOSIS — K7682 Hepatic encephalopathy: Secondary | ICD-10-CM | POA: Insufficient documentation

## 2020-08-10 ENCOUNTER — Other Ambulatory Visit: Payer: Self-pay | Admitting: Nurse Practitioner

## 2020-08-10 DIAGNOSIS — K7031 Alcoholic cirrhosis of liver with ascites: Secondary | ICD-10-CM

## 2020-08-17 ENCOUNTER — Telehealth: Payer: Self-pay

## 2020-08-17 NOTE — Telephone Encounter (Signed)
-----   Message from Thornton Park, MD sent at 08/16/2020  5:04 PM EDT ----- Records from visit with NP Dawn Drazek from 08/09/20 reviewed. Please schedule EGD for esophageal varices screening. He should be a candidate for [facility].   Thank you.  KLB

## 2020-08-17 NOTE — Telephone Encounter (Signed)
Note sent to [facility] to assist with scheduling.

## 2020-08-18 ENCOUNTER — Other Ambulatory Visit: Payer: Self-pay

## 2020-08-18 DIAGNOSIS — K7469 Other cirrhosis of liver: Secondary | ICD-10-CM

## 2020-08-18 NOTE — Telephone Encounter (Signed)
Pt has already been scheduled on 09/20/20 at 9:00am at [facility].

## 2020-08-18 NOTE — Telephone Encounter (Signed)
Pt scheduled for EGD in the [place] 09/20/20@9am . Pt sent instructions via mychart, amb referral in epic.

## 2020-08-22 ENCOUNTER — Other Ambulatory Visit: Payer: Self-pay | Admitting: Family Medicine

## 2020-08-22 MED ORDER — LORAZEPAM 0.5 MG PO TABS: 0.5000 mg | ORAL_TABLET | Freq: Two times a day (BID) | ORAL | 0 refills | Status: DC | PRN

## 2020-09-01 ENCOUNTER — Other Ambulatory Visit: Payer: 59

## 2020-09-08 ENCOUNTER — Ambulatory Visit: Payer: 59 | Admitting: Dermatology

## 2020-09-15 ENCOUNTER — Telehealth: Payer: Self-pay | Admitting: *Deleted

## 2020-09-15 ENCOUNTER — Encounter: Payer: Self-pay | Admitting: Gastroenterology

## 2020-09-15 NOTE — Telephone Encounter (Signed)
Received call from patient.   Reports that he returned home from 2 week vacation in [place] on  09/11/2020. Reports that he stopped at restaurant on the way home for dinner.   States that he has had GI upset and loose stools since 09/13/2020. Reports that he noted he is passing bright red blood today.   States that he has RUQ [place] on 09/19/2020 and upper endoscopy on 09/20/2020.  Advised to contact GI for further recommendations.

## 2020-09-15 NOTE — Telephone Encounter (Signed)
Agree with care advice.

## 2020-09-16 ENCOUNTER — Encounter (HOSPITAL_BASED_OUTPATIENT_CLINIC_OR_DEPARTMENT_OTHER): Payer: Self-pay | Admitting: *Deleted

## 2020-09-16 ENCOUNTER — Ambulatory Visit
Admission: EM | Admit: 2020-09-16 | Discharge: 2020-09-16 | Disposition: A | Discharge status: Home / Self Care | Payer: 59

## 2020-09-16 ENCOUNTER — Telehealth: Payer: Self-pay | Admitting: Gastroenterology

## 2020-09-16 ENCOUNTER — Other Ambulatory Visit: Payer: Self-pay

## 2020-09-16 ENCOUNTER — Emergency Department (HOSPITAL_BASED_OUTPATIENT_CLINIC_OR_DEPARTMENT_OTHER)
Admission: EM | Admit: 2020-09-16 | Discharge: 2020-09-16 | Disposition: A | Discharge status: Home / Self Care | Payer: 59 | Attending: Emergency Medicine | Admitting: Emergency Medicine

## 2020-09-16 ENCOUNTER — Encounter: Payer: Self-pay | Admitting: Emergency Medicine

## 2020-09-16 DIAGNOSIS — I10 Essential (primary) hypertension: Secondary | ICD-10-CM | POA: Insufficient documentation

## 2020-09-16 DIAGNOSIS — K219 Gastro-esophageal reflux disease without esophagitis: Secondary | ICD-10-CM | POA: Insufficient documentation

## 2020-09-16 DIAGNOSIS — R1010 Upper abdominal pain, unspecified: Secondary | ICD-10-CM | POA: Diagnosis present

## 2020-09-16 DIAGNOSIS — R197 Diarrhea, unspecified: Secondary | ICD-10-CM | POA: Diagnosis not present

## 2020-09-16 DIAGNOSIS — F17210 Nicotine dependence, cigarettes, uncomplicated: Secondary | ICD-10-CM | POA: Diagnosis not present

## 2020-09-16 DIAGNOSIS — R69 Illness, unspecified: Secondary | ICD-10-CM | POA: Diagnosis not present

## 2020-09-16 DIAGNOSIS — A09 Infectious gastroenteritis and colitis, unspecified: Secondary | ICD-10-CM | POA: Diagnosis not present

## 2020-09-16 LAB — COMPREHENSIVE METABOLIC PANEL
ALT: 48 U/L — ABNORMAL HIGH (ref 0–44)
AST: 64 U/L — ABNORMAL HIGH (ref 15–41)
Albumin: 4.5 g/dL (ref 3.5–5.0)
Alkaline Phosphatase: 169 U/L — ABNORMAL HIGH (ref 38–126)
Anion gap: 20 — ABNORMAL HIGH (ref 5–15)
BUN: 8 mg/dL (ref 6–20)
CO2: 19 mmol/L — ABNORMAL LOW (ref 22–32)
Calcium: 9.5 mg/dL (ref 8.9–10.3)
Chloride: 96 mmol/L — ABNORMAL LOW (ref 98–111)
Creatinine, Ser: 0.71 mg/dL (ref 0.61–1.24)
GFR, Estimated: >60 mL/min (ref >60)
Glucose, Bld: 120 mg/dL — ABNORMAL HIGH (ref 70–99)
Potassium: 3.3 mmol/L — ABNORMAL LOW (ref 3.5–5.1)
Sodium: 135 mmol/L (ref 135–145)
Total Bilirubin: 0.7 mg/dL (ref 0.3–1.2)
Total Protein: 8.2 g/dL — ABNORMAL HIGH (ref 6.5–8.1)

## 2020-09-16 LAB — URINALYSIS, ROUTINE W REFLEX MICROSCOPIC
Bilirubin Urine: NEGATIVE
Glucose, UA: NEGATIVE mg/dL
Hgb urine dipstick: NEGATIVE
Ketones, ur: NEGATIVE mg/dL
Leukocytes,Ua: NEGATIVE
Nitrite: NEGATIVE
Specific Gravity, Urine: 1.008 (ref 1.005–1.030)
pH: 6 (ref 5.0–8.0)

## 2020-09-16 LAB — CBC
HCT: 53.5 % — ABNORMAL HIGH (ref 39.0–52.0)
Hemoglobin: 19.4 g/dL — ABNORMAL HIGH (ref 13.0–17.0)
MCH: 35.3 pg — ABNORMAL HIGH (ref 26.0–34.0)
MCHC: 36.3 g/dL — ABNORMAL HIGH (ref 30.0–36.0)
MCV: 97.3 fL (ref 80.0–100.0)
Platelets: 226 10*3/uL (ref 150–400)
RBC: 5.5 MIL/uL (ref 4.22–5.81)
RDW: 14 % (ref 11.5–15.5)
WBC: 6 10*3/uL (ref 4.0–10.5)
nRBC: 0 % (ref 0.0–0.2)

## 2020-09-16 LAB — LIPASE, BLOOD: Lipase: <10 U/L — ABNORMAL LOW (ref 11–51)

## 2020-09-16 MED ORDER — AZITHROMYCIN 500 MG PO TABS: 500.0000 mg | ORAL_TABLET | Freq: Every day | ORAL | 0 refills | Status: AC

## 2020-09-16 MED ORDER — ACETAMINOPHEN 325 MG PO TABS
650.0000 mg | ORAL_TABLET | Freq: Once | ORAL | Status: AC
  Administered 2020-09-16: 650 mg via ORAL
  Filled 2020-09-16: qty 2

## 2020-09-16 MED ORDER — AZITHROMYCIN 250 MG PO TABS
500.0000 mg | ORAL_TABLET | Freq: Once | ORAL | Status: AC
  Administered 2020-09-16: 500 mg via ORAL
  Filled 2020-09-16: qty 2

## 2020-09-16 MED ORDER — LOPERAMIDE HCL 2 MG PO CAPS
4.0000 mg | ORAL_CAPSULE | Freq: Once | ORAL | Status: AC
  Administered 2020-09-16: 4 mg via ORAL
  Filled 2020-09-16: qty 2

## 2020-09-16 MED ORDER — ONDANSETRON HCL 4 MG/2ML IJ SOLN
4.0000 mg | Freq: Once | INTRAMUSCULAR | Status: AC
Start: 2020-09-16 — End: 2020-09-16
  Administered 2020-09-16: 4 mg via INTRAVENOUS
  Filled 2020-09-16: qty 2

## 2020-09-16 MED ORDER — ALUM & MAG HYDROXIDE-SIMETH 200-200-20 MG/5ML PO SUSP
30.0000 mL | Freq: Once | ORAL | Status: AC
  Administered 2020-09-16: 30 mL via ORAL
  Filled 2020-09-16: qty 30

## 2020-09-16 MED ORDER — LIDOCAINE VISCOUS HCL 2 % MT SOLN
15.0000 mL | Freq: Once | OROMUCOSAL | Status: AC
  Administered 2020-09-16: 15 mL via ORAL
  Filled 2020-09-16: qty 15

## 2020-09-16 MED ORDER — SODIUM CHLORIDE 0.9 % IV BOLUS
1000.0000 mL | Freq: Once | INTRAVENOUS | Status: AC
Start: 2020-09-16 — End: 2020-09-16
  Administered 2020-09-16: 1000 mL via INTRAVENOUS

## 2020-09-16 NOTE — Telephone Encounter (Signed)
Spoke with pt and he reports he ate at a food truck a few days ago and has had diarrhea and nausea and vomiting since. He wanted to know if he should go to the ER. Discussed with him that he could have a virus or it could have been something he ate. Suggested he go to urgent care as they could give him IV fluids and nausea meds if he needed them and he could take Imodium otc. Encouraged him to push fluids like gatorade or pedialyte. Pt verbalized understanding.

## 2020-09-16 NOTE — ED Notes (Signed)
Provided pt with gingerale for fluid challenge. Pt c/o headache and stomachache MD informed via secure chat.

## 2020-09-16 NOTE — ED Triage Notes (Signed)
N/V/D x 3 days, vomited 5 times in the last 24 hours and 25 times diarrhea in the last 24 hours.  Patient just arrived from [place] this past Sunday and the N/V/D started Tuesday.

## 2020-09-16 NOTE — ED Provider Notes (Signed)
[facility] EMERGENCY DEPT Provider Note   CSN: [id number] Arrival date & time: 09/16/20  1701     History Chief Complaint  Patient presents with   Emesis   Diarrhea    Christopher Carrillo is a 50 y.o. male.  The history is provided by the patient.  Emesis Severity:  Severe Duration:  3 days Timing:  Intermittent Number of daily episodes:  5 Quality:  Stomach contents Progression:  Unchanged Chronicity:  New Recent urination:  Decreased Relieved by:  Nothing Worsened by:  Liquids Ineffective treatments: pepto bismol. Associated symptoms: abdominal pain (burning, upper abdominal pain), diarrhea and fever   Associated symptoms: no arthralgias, no chills, no cough, no headaches and no sore throat   Risk factors: travel to endemic areas   Risk factors comment:  Traveled for 2 weeks throughout [place]. Symptoms developed after eating at a food truck in [place] Diarrhea Quality:  Watery and copious Severity:  Severe Onset quality:  Sudden Number of episodes:  25 Duration:  3 days Timing:  Intermittent Progression:  Unchanged Relieved by:  Nothing Worsened by:  Liquids Ineffective treatments: Pepto. Associated symptoms: abdominal pain (burning, upper abdominal pain), fever and vomiting   Associated symptoms: no arthralgias, no chills and no headaches   Risk factors: no recent antibiotic use       Past Medical History:  Diagnosis Date   Abdominal pain    Alcoholic hepatitis without ascites    Anemia    Elevated bilirubin    Essential hypertension    Psoriasis    Transaminitis     Patient Active Problem List   Diagnosis Date Noted   Polyarthralgia 01/08/2020   History of diverticulosis 10/28/2019   Internal and external bleeding hemorrhoids 08/20/2019   Colon ulcer    Hematochezia 08/12/2019   Leukocytosis 60/15/6153   Alcoholic cirrhosis ([place])    Coagulopathy ([place])    Lower extremity edema 07/29/2019   Urinary hesitancy 07/29/2019    Anemia of chronic disease 07/29/2019   Drug-induced hepatic toxicity 07/25/2019   Elevated bilirubin    Elevated LFTs 07/14/2019   Hepatic steatosis 02/18/2019   GAD (generalized anxiety disorder) 02/18/2019   GERD (gastroesophageal reflux disease) 04/08/2013   Abdominal pain 04/07/2013   Hyperlipidemia 12/19/2012   Essential hypertension 12/19/2012   Routine general medical examination at a health care facility 08/07/2012   Psoriatic arthritis ([place]) 05/04/2012   Obese 05/04/2012   Tobacco use 05/04/2012    Past Surgical History:  Procedure Laterality Date   BIOPSY  07/20/2019   Procedure: BIOPSY;  Surgeon: Daneil Dolin, MD;  [facility];  Service: Endoscopy;;   BIOPSY  08/14/2019   Procedure: BIOPSY;  Surgeon: Thornton Park, MD;  Location: [facility];  Service: Gastroenterology;;   COLONOSCOPY WITH PROPOFOL N/A 07/20/2019   Procedure: COLONOSCOPY WITH PROPOFOL;  Surgeon: Daneil Dolin, MD;  [facility];  Service: Endoscopy;  Laterality: N/A;  10:15am   COLONOSCOPY WITH PROPOFOL N/A 08/14/2019   Procedure: COLONOSCOPY WITH PROPOFOL;  Surgeon: Thornton Park, MD;  Location: [place];  Service: Gastroenterology;  Laterality: N/A;   IR PARACENTESIS  08/14/2019   POLYPECTOMY  07/20/2019   Procedure: POLYPECTOMY;  Surgeon: Daneil Dolin, MD;  [facility];  Service: Endoscopy;;   SUBMUCOSAL TATTOO INJECTION  08/14/2019   Procedure: SUBMUCOSAL TATTOO INJECTION;  Surgeon: Thornton Park, MD;  Location: [facility] ENDOSCOPY;  Service: Gastroenterology;;       Family History  Problem Relation Age of Onset  Asthma Father    Cancer Sister 96       Pharyngeal   Colon cancer Neg Hx    Liver disease Neg Hx     Social History   Tobacco Use   Smoking status: Every Day    Packs/day: 0.33    Pack years: 0.00    Types: Cigarettes   Smokeless tobacco: Never  Vaping Use   Vaping Use: Never used  Substance Use Topics   Alcohol use: Yes     Alcohol/week: 1.0 standard drink    Types: 1 Cans of beer per week    Comment: occasionally   Drug use: No    Home Medications Prior to Admission medications   Medication Sig Start Date End Date Taking? Authorizing Provider  lactulose (CHRONULAC) 10 GM/15ML solution TAKE 15 MILLILITERS BY MOUTH TWICE DAILY-REDUCE OR STOP TAKING IF MORE THAN 3 BOWEL MOVEMENTS A DAY 06/10/20  Yes Merriman, Modena Nunnery, MD  LORazepam (ATIVAN) 0.5 MG tablet Take 1 tablet (0.5 mg total) by mouth 2 (two) times daily as needed for anxiety. 08/22/20  Yes Susy Frizzle, MD    Allergies    Patient has no known allergies.  Review of Systems   Review of Systems  Constitutional:  Positive for fever. Negative for chills.  HENT:  Negative for ear pain and sore throat.   Eyes:  Negative for pain and visual disturbance.  Respiratory:  Negative for cough and shortness of breath.   Cardiovascular:  Negative for chest pain and palpitations.  Gastrointestinal:  Positive for abdominal pain (burning, upper abdominal pain), diarrhea and vomiting.  Genitourinary:  Negative for dysuria and hematuria.  Musculoskeletal:  Negative for arthralgias and back pain.  Skin:  Negative for color change and rash.  Neurological:  Negative for seizures, syncope and headaches.  All other systems reviewed and are negative.  Physical Exam Updated Vital Signs BP (!) 134/98 (BP Location: Left Arm)   Pulse 100   Temp 98.4 F (36.9 C) (Oral)   Resp 16   Ht 5' 8"  (1.727 m)   Wt 90.7 kg   SpO2 95%   BMI 30.41 kg/m   Physical Exam Vitals and nursing note reviewed.  Constitutional:      Appearance: Normal appearance.  HENT:     Head: Normocephalic and atraumatic.  Eyes:     Conjunctiva/sclera: Conjunctivae normal.  Pulmonary:     Effort: Pulmonary effort is normal. No respiratory distress.  Abdominal:     General: There is no distension.     Tenderness: There is no abdominal tenderness. There is no guarding.  Musculoskeletal:         General: No deformity. Normal range of motion.     Cervical back: Normal range of motion.  Skin:    General: Skin is warm and dry.  Neurological:     General: No focal deficit present.     Mental Status: He is alert and oriented to person, place, and time. Mental status is at baseline.  Psychiatric:        Mood and Affect: Mood normal.    ED Results / Procedures / Treatments   Labs (all labs ordered are listed, but only abnormal results are displayed) Labs Reviewed  LIPASE, BLOOD - Abnormal; Notable for the following components:      Result Value   Lipase <10 (*)    All other components within normal limits  COMPREHENSIVE METABOLIC PANEL - Abnormal; Notable for the following components:   Potassium 3.3 (*)  Chloride 96 (*)    CO2 19 (*)    Glucose, Bld 120 (*)    Total Protein 8.2 (*)    AST 64 (*)    ALT 48 (*)    Alkaline Phosphatase 169 (*)    Anion gap 20 (*)    All other components within normal limits  CBC - Abnormal; Notable for the following components:   Hemoglobin 19.4 (*)    HCT 53.5 (*)    MCH 35.3 (*)    MCHC 36.3 (*)    All other components within normal limits  URINALYSIS, ROUTINE W REFLEX MICROSCOPIC - Abnormal; Notable for the following components:   Protein, ur TRACE (*)    All other components within normal limits  GASTROINTESTINAL PANEL BY PCR, STOOL (REPLACES STOOL CULTURE)    EKG None  Radiology No results found.  Procedures Procedures   Medications Ordered in ED Medications  loperamide (IMODIUM) capsule 4 mg (has no administration in time range)  azithromycin (ZITHROMAX) tablet 500 mg (has no administration in time range)  sodium chloride 0.9 % bolus 1,000 mL (0 mLs Intravenous Stopped 09/16/20 1949)  ondansetron (ZOFRAN) injection 4 mg (4 mg Intravenous Given 09/16/20 1826)  acetaminophen (TYLENOL) tablet 650 mg (650 mg Oral Given 09/16/20 2017)  alum & mag hydroxide-simeth (MAALOX/MYLANTA) 200-200-20 MG/5ML suspension 30 mL (30  mLs Oral Given 09/16/20 2018)    And  lidocaine (XYLOCAINE) 2 % viscous mouth solution 15 mL (15 mLs Oral Given 09/16/20 2018)    ED Course  I have reviewed the triage vital signs and the nursing notes.  Pertinent labs & imaging results that were available during my care of the patient were reviewed by me and considered in my medical decision making (see chart for details).    MDM Rules/Calculators/A&P                          Christopher Carrillo presents with nausea, vomiting, and especially diarrhea that has been ongoing for several days.  It is likely foodborne in origin.  No risk factors for C. difficile.  He did recently travel to [place] but actually got sick afterwards.  He was given IV hydration.  I spoke with him about whether or not to treat him empirically for bacterial source of infection.  He had diarrhea numerous times here in the ED, and did desire to have empiric treatment.  Stool tests are pending, and will be followed.  He was given return precautions should things worsen. Final Clinical Impression(s) / ED Diagnoses Final diagnoses:  Diarrhea of presumed infectious origin    Rx / DC Orders ED Discharge Orders          Ordered    azithromycin (ZITHROMAX) 500 MG tablet  Daily        09/16/20 2202             Arnaldo Natal, MD 09/16/20 2205

## 2020-09-16 NOTE — Telephone Encounter (Signed)
Inbound call from pt stating he has been sick for the past couple of days and is requesting a call back. Thank you

## 2020-09-16 NOTE — ED Triage Notes (Signed)
N/V/D x 3 days. Has vomited x 4-5 times today. Has had diarrhea 25 times today.

## 2020-09-16 NOTE — ED Notes (Signed)
Patient is being discharged from the Urgent Care and sent to the Emergency Department via pov . Per [place], [place] , patient is in need of higher level of care due to N/V/D and abd pain . Patient is aware and verbalizes understanding of plan of care.  Vitals:   09/16/20 1553  BP: (!) 134/95  Pulse: 98  Resp: 18  Temp: 98.3 F (36.8 C)  SpO2: 97%

## 2020-09-17 ENCOUNTER — Telehealth (HOSPITAL_BASED_OUTPATIENT_CLINIC_OR_DEPARTMENT_OTHER): Payer: Self-pay | Admitting: Emergency Medicine

## 2020-09-17 LAB — GASTROINTESTINAL PANEL BY PCR, STOOL (REPLACES STOOL CULTURE)
Adenovirus F40/41: NOT DETECTED
Astrovirus: NOT DETECTED
Campylobacter species: NOT DETECTED
Cryptosporidium: DETECTED — AB
Cyclospora cayetanensis: NOT DETECTED
Entamoeba histolytica: NOT DETECTED
Enteroaggregative E coli (EAEC): NOT DETECTED
Enteropathogenic E coli (EPEC): NOT DETECTED
Enterotoxigenic E coli (ETEC): NOT DETECTED
Giardia lamblia: NOT DETECTED
Norovirus GI/GII: NOT DETECTED
Plesimonas shigelloides: DETECTED — AB
Rotavirus A: NOT DETECTED
Salmonella species: DETECTED — AB
Sapovirus (I, II, IV, and V): NOT DETECTED
Shiga like toxin producing E coli (STEC): NOT DETECTED
Shigella/Enteroinvasive E coli (EIEC): NOT DETECTED
Vibrio cholerae: NOT DETECTED
Vibrio species: NOT DETECTED
Yersinia enterocolitica: NOT DETECTED

## 2020-09-17 MED ORDER — TRIMETHOPRIM 100 MG PO TABS: 100.0000 mg | ORAL_TABLET | Freq: Two times a day (BID) | ORAL | 0 refills | Status: DC

## 2020-09-17 NOTE — ED Notes (Signed)
Pt notified re: stool PCR. He will pick up meds.

## 2020-09-17 NOTE — Telephone Encounter (Signed)
Nursing reports that the stool pathogen panel returned showing several concerning findings including Salmonella, Plesimonas shigelloides, and Cryptosporidium.  Patient was given azithromycin however I called pharmacy who feels that the Plesimonas would potentially be resistant to this.  They recommended prescription of trimethoprim 100 mg twice daily for 5 days.  When asked about the Cryptosporidium, pharmacy found that the CDC recommendations are to not treat with antibiotics unless symptoms are rapidly worsening in which case antibiotics could be indicated.  Nursing spoke with the patient who is agreeable to the trimethoprim and follow-up otherwise we will send in prescription for the trimethoprim and have him follow-up.  If any symptoms were to change or worsen, patient was told to return.

## 2020-09-19 ENCOUNTER — Telehealth: Payer: Self-pay | Admitting: Gastroenterology

## 2020-09-19 ENCOUNTER — Telehealth: Payer: Self-pay | Admitting: *Deleted

## 2020-09-19 ENCOUNTER — Other Ambulatory Visit: Payer: Self-pay | Admitting: Family Medicine

## 2020-09-19 ENCOUNTER — Other Ambulatory Visit: Payer: 59

## 2020-09-19 MED ORDER — NITAZOXANIDE 500 MG PO TABS: 500.0000 mg | ORAL_TABLET | Freq: Two times a day (BID) | ORAL | 0 refills | Status: DC

## 2020-09-19 MED ORDER — CIPROFLOXACIN HCL 500 MG PO TABS: 500.0000 mg | ORAL_TABLET | Freq: Two times a day (BID) | ORAL | 0 refills | Status: AC

## 2020-09-19 NOTE — Telephone Encounter (Signed)
Received call from patient.   Reports that he did go to ER per GI recommendations over weekend for N/V/D and blood in stools.   States that stool panel was performed and noted several concerning findings including Salmonella, Plesimonas shigelloides, and Cryptosporidium. Patient was given Amoxicillin in ER, but after panel, ED MD called to change ABTx to Trimethoprim.   Patient reports that he continues to have increased loose stools (20-25 per day). States that he feels nauseated constantly and does not feel like he can keep any foods down.   Requested PCP review and advise.

## 2020-09-19 NOTE — Telephone Encounter (Signed)
Patient called to cancel his procedure for tomorrow said he was diagnosed with Salmonella poisoning.

## 2020-09-19 NOTE — Telephone Encounter (Signed)
Call placed to patient and patient made aware.  

## 2020-09-20 ENCOUNTER — Encounter: Payer: 59 | Admitting: Gastroenterology

## 2020-09-22 ENCOUNTER — Inpatient Hospital Stay (HOSPITAL_COMMUNITY)
Admission: EM | Admit: 2020-09-22 | Discharge: 2020-09-30 | DRG: 372 | Disposition: A | Discharge status: Home / Self Care | Payer: 59 | Attending: Internal Medicine | Admitting: Internal Medicine

## 2020-09-22 ENCOUNTER — Other Ambulatory Visit: Payer: Self-pay

## 2020-09-22 ENCOUNTER — Telehealth: Payer: 59 | Admitting: Physician Assistant

## 2020-09-22 ENCOUNTER — Encounter: Payer: Self-pay | Admitting: Physician Assistant

## 2020-09-22 ENCOUNTER — Encounter (HOSPITAL_COMMUNITY): Payer: Self-pay

## 2020-09-22 DIAGNOSIS — Z6829 Body mass index (BMI) 29.0-29.9, adult: Secondary | ICD-10-CM | POA: Diagnosis not present

## 2020-09-22 DIAGNOSIS — R1084 Generalized abdominal pain: Secondary | ICD-10-CM

## 2020-09-22 DIAGNOSIS — E876 Hypokalemia: Secondary | ICD-10-CM | POA: Diagnosis not present

## 2020-09-22 DIAGNOSIS — K529 Noninfective gastroenteritis and colitis, unspecified: Secondary | ICD-10-CM | POA: Diagnosis not present

## 2020-09-22 DIAGNOSIS — K5732 Diverticulitis of large intestine without perforation or abscess without bleeding: Secondary | ICD-10-CM | POA: Diagnosis not present

## 2020-09-22 DIAGNOSIS — E872 Acidosis: Secondary | ICD-10-CM | POA: Diagnosis not present

## 2020-09-22 DIAGNOSIS — A029 Salmonella infection, unspecified: Secondary | ICD-10-CM

## 2020-09-22 DIAGNOSIS — K2970 Gastritis, unspecified, without bleeding: Secondary | ICD-10-CM | POA: Diagnosis present

## 2020-09-22 DIAGNOSIS — F17210 Nicotine dependence, cigarettes, uncomplicated: Secondary | ICD-10-CM | POA: Diagnosis present

## 2020-09-22 DIAGNOSIS — R112 Nausea with vomiting, unspecified: Secondary | ICD-10-CM | POA: Diagnosis present

## 2020-09-22 DIAGNOSIS — E663 Overweight: Secondary | ICD-10-CM | POA: Diagnosis present

## 2020-09-22 DIAGNOSIS — D751 Secondary polycythemia: Secondary | ICD-10-CM | POA: Diagnosis present

## 2020-09-22 DIAGNOSIS — Z79899 Other long term (current) drug therapy: Secondary | ICD-10-CM | POA: Diagnosis not present

## 2020-09-22 DIAGNOSIS — A072 Cryptosporidiosis: Secondary | ICD-10-CM

## 2020-09-22 DIAGNOSIS — E860 Dehydration: Secondary | ICD-10-CM | POA: Diagnosis not present

## 2020-09-22 DIAGNOSIS — R42 Dizziness and giddiness: Secondary | ICD-10-CM

## 2020-09-22 DIAGNOSIS — A09 Infectious gastroenteritis and colitis, unspecified: Secondary | ICD-10-CM | POA: Diagnosis not present

## 2020-09-22 DIAGNOSIS — K7030 Alcoholic cirrhosis of liver without ascites: Secondary | ICD-10-CM | POA: Diagnosis present

## 2020-09-22 DIAGNOSIS — E861 Hypovolemia: Secondary | ICD-10-CM | POA: Diagnosis present

## 2020-09-22 DIAGNOSIS — I10 Essential (primary) hypertension: Secondary | ICD-10-CM | POA: Diagnosis not present

## 2020-09-22 DIAGNOSIS — N179 Acute kidney failure, unspecified: Secondary | ICD-10-CM | POA: Diagnosis present

## 2020-09-22 DIAGNOSIS — R7989 Other specified abnormal findings of blood chemistry: Secondary | ICD-10-CM | POA: Diagnosis present

## 2020-09-22 DIAGNOSIS — K219 Gastro-esophageal reflux disease without esophagitis: Secondary | ICD-10-CM | POA: Diagnosis present

## 2020-09-22 DIAGNOSIS — F411 Generalized anxiety disorder: Secondary | ICD-10-CM | POA: Diagnosis present

## 2020-09-22 DIAGNOSIS — E871 Hypo-osmolality and hyponatremia: Secondary | ICD-10-CM | POA: Diagnosis not present

## 2020-09-22 DIAGNOSIS — R17 Unspecified jaundice: Secondary | ICD-10-CM | POA: Diagnosis present

## 2020-09-22 DIAGNOSIS — K922 Gastrointestinal hemorrhage, unspecified: Secondary | ICD-10-CM

## 2020-09-22 DIAGNOSIS — K5720 Diverticulitis of large intestine with perforation and abscess without bleeding: Secondary | ICD-10-CM | POA: Diagnosis not present

## 2020-09-22 DIAGNOSIS — R109 Unspecified abdominal pain: Secondary | ICD-10-CM | POA: Diagnosis present

## 2020-09-22 DIAGNOSIS — R197 Diarrhea, unspecified: Secondary | ICD-10-CM | POA: Diagnosis present

## 2020-09-22 DIAGNOSIS — Z20822 Contact with and (suspected) exposure to covid-19: Secondary | ICD-10-CM | POA: Diagnosis not present

## 2020-09-22 DIAGNOSIS — R69 Illness, unspecified: Secondary | ICD-10-CM | POA: Diagnosis not present

## 2020-09-22 DIAGNOSIS — A039 Shigellosis, unspecified: Secondary | ICD-10-CM

## 2020-09-22 DIAGNOSIS — Z8719 Personal history of other diseases of the digestive system: Secondary | ICD-10-CM

## 2020-09-22 DIAGNOSIS — A020 Salmonella enteritis: Secondary | ICD-10-CM

## 2020-09-22 LAB — URINALYSIS, MICROSCOPIC (REFLEX): RBC / HPF: NONE SEEN RBC/hpf (ref 0–5)

## 2020-09-22 LAB — CBC WITH DIFFERENTIAL/PLATELET
Abs Immature Granulocytes: 0.15 10*3/uL — ABNORMAL HIGH (ref 0.00–0.07)
Basophils Absolute: 0 10*3/uL (ref 0.0–0.1)
Basophils Relative: 0 %
Eosinophils Absolute: 0.2 10*3/uL (ref 0.0–0.5)
Eosinophils Relative: 3 %
HCT: 57.2 % — ABNORMAL HIGH (ref 39.0–52.0)
Hemoglobin: 21.6 g/dL (ref 13.0–17.0)
Immature Granulocytes: 2 %
Lymphocytes Relative: 9 %
Lymphs Abs: 0.9 10*3/uL (ref 0.7–4.0)
MCH: 35.4 pg — ABNORMAL HIGH (ref 26.0–34.0)
MCHC: 37.8 g/dL — ABNORMAL HIGH (ref 30.0–36.0)
MCV: 93.6 fL (ref 80.0–100.0)
Monocytes Absolute: 1.8 10*3/uL — ABNORMAL HIGH (ref 0.1–1.0)
Monocytes Relative: 20 %
Neutro Abs: 6.3 10*3/uL (ref 1.7–7.7)
Neutrophils Relative %: 66 %
Platelets: 260 10*3/uL (ref 150–400)
RBC: 6.11 MIL/uL — ABNORMAL HIGH (ref 4.22–5.81)
RDW: 13.4 % (ref 11.5–15.5)
Smear Review: NORMAL
WBC: 9.5 10*3/uL (ref 4.0–10.5)
nRBC: 0 % (ref 0.0–0.2)

## 2020-09-22 LAB — COMPREHENSIVE METABOLIC PANEL
ALT: 82 U/L — ABNORMAL HIGH (ref 0–44)
ALT: 80 U/L — ABNORMAL HIGH (ref 0–44)
AST: 69 U/L — ABNORMAL HIGH (ref 15–41)
AST: 67 U/L — ABNORMAL HIGH (ref 15–41)
Albumin: 4.5 g/dL (ref 3.5–5.0)
Albumin: 4.6 g/dL (ref 3.5–5.0)
Alkaline Phosphatase: 197 U/L — ABNORMAL HIGH (ref 38–126)
Alkaline Phosphatase: 201 U/L — ABNORMAL HIGH (ref 38–126)
Anion gap: 14 (ref 5–15)
Anion gap: 14 (ref 5–15)
BUN: 35 mg/dL — ABNORMAL HIGH (ref 6–20)
BUN: 37 mg/dL — ABNORMAL HIGH (ref 6–20)
CO2: 17 mmol/L — ABNORMAL LOW (ref 22–32)
CO2: 18 mmol/L — ABNORMAL LOW (ref 22–32)
Calcium: 8.5 mg/dL — ABNORMAL LOW (ref 8.9–10.3)
Calcium: 8.9 mg/dL (ref 8.9–10.3)
Chloride: 88 mmol/L — ABNORMAL LOW (ref 98–111)
Chloride: 90 mmol/L — ABNORMAL LOW (ref 98–111)
Creatinine, Ser: 1.46 mg/dL — ABNORMAL HIGH (ref 0.61–1.24)
Creatinine, Ser: 1.45 mg/dL — ABNORMAL HIGH (ref 0.61–1.24)
GFR, Estimated: 59 mL/min — ABNORMAL LOW (ref >60)
GFR, Estimated: 59 mL/min — ABNORMAL LOW (ref >60)
Glucose, Bld: 150 mg/dL — ABNORMAL HIGH (ref 70–99)
Glucose, Bld: 141 mg/dL — ABNORMAL HIGH (ref 70–99)
Potassium: 2.6 mmol/L — CL (ref 3.5–5.1)
Potassium: 2.7 mmol/L — CL (ref 3.5–5.1)
Sodium: 119 mmol/L — CL (ref 135–145)
Sodium: 122 mmol/L — ABNORMAL LOW (ref 135–145)
Total Bilirubin: 2.4 mg/dL — ABNORMAL HIGH (ref 0.3–1.2)
Total Bilirubin: 2.3 mg/dL — ABNORMAL HIGH (ref 0.3–1.2)
Total Protein: 8 g/dL (ref 6.5–8.1)
Total Protein: 8.3 g/dL — ABNORMAL HIGH (ref 6.5–8.1)

## 2020-09-22 LAB — CBC
Hemoglobin: 21.2 g/dL (ref 13.0–17.0)
Platelets: 255 10*3/uL (ref 150–400)
WBC: 9.8 10*3/uL (ref 4.0–10.5)

## 2020-09-22 LAB — I-STAT CHEM 8, ED
BUN: 38 mg/dL — ABNORMAL HIGH (ref 6–20)
Calcium, Ion: 1.11 mmol/L — ABNORMAL LOW (ref 1.15–1.40)
Chloride: 92 mmol/L — ABNORMAL LOW (ref 98–111)
Creatinine, Ser: 1.5 mg/dL — ABNORMAL HIGH (ref 0.61–1.24)
Glucose, Bld: 136 mg/dL — ABNORMAL HIGH (ref 70–99)
HCT: 63 % — ABNORMAL HIGH (ref 39.0–52.0)
Hemoglobin: 21.4 g/dL (ref 13.0–17.0)
Potassium: 2.9 mmol/L — ABNORMAL LOW (ref 3.5–5.1)
Sodium: 124 mmol/L — ABNORMAL LOW (ref 135–145)
TCO2: 20 mmol/L — ABNORMAL LOW (ref 22–32)

## 2020-09-22 LAB — URINALYSIS, ROUTINE W REFLEX MICROSCOPIC
Glucose, UA: NEGATIVE mg/dL
Hgb urine dipstick: NEGATIVE
Ketones, ur: NEGATIVE mg/dL
Leukocytes,Ua: NEGATIVE
Nitrite: NEGATIVE
Protein, ur: 100 mg/dL — AB
Specific Gravity, Urine: >1.03 — ABNORMAL HIGH (ref 1.005–1.030)
pH: 6 (ref 5.0–8.0)

## 2020-09-22 LAB — LIPASE, BLOOD: Lipase: 135 U/L — ABNORMAL HIGH (ref 11–51)

## 2020-09-22 LAB — RESP PANEL BY RT-PCR (FLU A&B, COVID) ARPGX2
Influenza A by PCR: NEGATIVE
Influenza B by PCR: NEGATIVE
SARS Coronavirus 2 by RT PCR: NEGATIVE

## 2020-09-22 LAB — POC OCCULT BLOOD, ED: Fecal Occult Bld: POSITIVE — AB

## 2020-09-22 MED ORDER — PANTOPRAZOLE SODIUM 40 MG IV SOLR
40.0000 mg | Freq: Once | INTRAVENOUS | Status: AC
  Administered 2020-09-22: 40 mg via INTRAVENOUS
  Filled 2020-09-22: qty 40

## 2020-09-22 MED ORDER — SODIUM CHLORIDE 0.9 % IV SOLN
INTRAVENOUS | Status: DC | PRN
  Administered 2020-09-22: 1000 mL via INTRAVENOUS

## 2020-09-22 MED ORDER — SODIUM CHLORIDE 0.9 % IV BOLUS
1000.0000 mL | Freq: Once | INTRAVENOUS | Status: AC
  Administered 2020-09-22: 1000 mL via INTRAVENOUS

## 2020-09-22 MED ORDER — POTASSIUM CHLORIDE 10 MEQ/100ML IV SOLN
10.0000 meq | INTRAVENOUS | Status: AC
  Administered 2020-09-22 – 2020-09-23 (×4): 10 meq via INTRAVENOUS
  Filled 2020-09-22 (×4): qty 100

## 2020-09-22 MED ORDER — ONDANSETRON HCL 4 MG/2ML IJ SOLN
4.0000 mg | Freq: Once | INTRAMUSCULAR | Status: AC
  Administered 2020-09-22: 4 mg via INTRAVENOUS
  Filled 2020-09-22: qty 2

## 2020-09-22 MED ORDER — MORPHINE SULFATE (PF) 4 MG/ML IV SOLN
4.0000 mg | Freq: Once | INTRAVENOUS | Status: AC
Start: 2020-09-22 — End: 2020-09-22
  Administered 2020-09-22: 4 mg via INTRAVENOUS
  Filled 2020-09-22: qty 1

## 2020-09-22 NOTE — ED Notes (Signed)
Date and time results received: 09/22/20 8:44 PM(use smartphrase ".now" to insert current time)  Test: Hbg Critical Value: 21.2  Name of Provider Notified: DR Melina Copa  Orders Received? Or Actions Taken?: see chart

## 2020-09-22 NOTE — ED Triage Notes (Signed)
Pt states n/v/d for over a week, states he has salmonella, increased weakness, unable to eat. States he only has one dose left of last abx. States he can drink fluids but then has diarrhea

## 2020-09-22 NOTE — H&P (Signed)
History and Physical  Christopher Carrillo JSH:[id number] DOB: Jul 10, 1970 DOA: 09/22/2020  Referring physician: Hayden Rasmussen, MD PCP: Susy Frizzle, MD  Patient coming from: Home  Chief Complaint: Diarrhea  HPI: Christopher Carrillo is a 50 y.o. male with medical history significant for alcoholic cirrhosis who presents to the ED due to 10-day onset of diarrhea and nausea with an associated abdominal pain.  Patient returned from [place] about 12 days ago, he states that he had non bloody vomiting in the first 3 days of his symptoms, but this has since subsided.  He went to [facility] where stool culture was positive for plesimonas shigelloides, Salmonella and Cryptosporidium, he was initially started on Zithromax prior to the findings of stool culture and trimethoprim was added without improvement of symptoms, so patient contacted his PCP on 09/19/2020 and was told to stop azithromycin and trimethoprim and was then started. Cipro twice daily for 3 days and nitazoxanide for 3 days were added.  Diarrhea continues despite being compliant with the medication regimen.  He complained of black stools, but he also admitted to taking Pepto-Bismol with last intake being 2 days ago denies fever, chills.  He endorses losing about 20 pounds this onset of symptoms and drinking Gatorade to rehydrate  ED Course:  In the emergency department, hemodynamically stable.  Work-up in the ED showed H/H 21.6/57.2 (baseline hemoglobin at 18.1-19.4), hyponatremia, hypokalemia, BUN/creatinine 37/1.45 (baseline creatinine at 0.7-0.8), transaminitis, FOBT positive.  Influenza A, B, SARS coronavirus 2 was negative.,  Lipase 135. He was treated with morphine, Protonix and IV hydration was provided.  Hospitalist was asked to admit patient for further evaluation and management.  Review of Systems: Constitutional: Negative for chills and fever.  HENT: Negative for ear pain and sore throat.   Eyes: Negative for pain and visual  disturbance.  Respiratory: Negative for cough, chest tightness and shortness of breath.   Cardiovascular: Negative for chest pain and palpitations.  Gastrointestinal: Positive for abdominal pain, nausea, diarrhea and vomiting.  Endocrine: Negative for polyphagia and polyuria.  Genitourinary: Negative for decreased urine volume, dysuria, enuresis Musculoskeletal: Negative for arthralgias and back pain.  Skin: Negative for color change and rash.  Allergic/Immunologic: Negative for immunocompromised state.  Neurological: Negative for tremors, syncope, speech difficulty Hematological: Does not bruise/bleed easily.  All other systems reviewed and are negative  Past Medical History:  Diagnosis Date   Abdominal pain    Alcoholic hepatitis without ascites    Anemia    Elevated bilirubin    Essential hypertension    Psoriasis    Transaminitis    Past Surgical History:  Procedure Laterality Date   BIOPSY  07/20/2019   Procedure: BIOPSY;  Surgeon: Daneil Dolin, MD;  [facility];  Service: Endoscopy;;   BIOPSY  08/14/2019   Procedure: BIOPSY;  Surgeon: Thornton Park, MD;  Location: [facility];  Service: Gastroenterology;;   COLONOSCOPY WITH PROPOFOL N/A 07/20/2019   Procedure: COLONOSCOPY WITH PROPOFOL;  Surgeon: Daneil Dolin, MD;  [facility];  Service: Endoscopy;  Laterality: N/A;  10:15am   COLONOSCOPY WITH PROPOFOL N/A 08/14/2019   Procedure: COLONOSCOPY WITH PROPOFOL;  Surgeon: Thornton Park, MD;  Location: [facility];  Service: Gastroenterology;  Laterality: N/A;   IR PARACENTESIS  08/14/2019   POLYPECTOMY  07/20/2019   Procedure: POLYPECTOMY;  Surgeon: Daneil Dolin, MD;  [facility];  Service: Endoscopy;;   SUBMUCOSAL TATTOO INJECTION  08/14/2019   Procedure: SUBMUCOSAL TATTOO INJECTION;  Surgeon: Thornton Park, MD;  Location[facility];  Service: Gastroenterology;;    Social History:  reports that he has been smoking cigarettes.  He has been smoking an average of 0.33 packs per day. He has never used smokeless tobacco. He reports current alcohol use of about 1.0 standard drink of alcohol per week. He reports that he does not use drugs.   No Known Allergies  Family History  Problem Relation Age of Onset   Asthma Father    Cancer Sister 76       Pharyngeal   Colon cancer Neg Hx    Liver disease Neg Hx      Prior to Admission medications   Medication Sig Start Date End Date Taking? Authorizing Provider  ciprofloxacin (CIPRO) 500 MG tablet Take 1 tablet (500 mg total) by mouth 2 (two) times daily for 3 days. 09/19/20 09/22/20  Susy Frizzle, MD  lactulose (CHRONULAC) 10 GM/15ML solution TAKE 15 MILLILITERS BY MOUTH TWICE DAILY-REDUCE OR STOP TAKING IF MORE THAN 3 BOWEL MOVEMENTS A DAY 06/10/20   [place], Modena Nunnery, MD  LORazepam (ATIVAN) 0.5 MG tablet Take 1 tablet (0.5 mg total) by mouth 2 (two) times daily as needed for anxiety. 08/22/20   Susy Frizzle, MD  nitazoxanide (ALINIA) 500 MG tablet Take 1 tablet (500 mg total) by mouth 2 (two) times daily with a meal. 09/19/20   Susy Frizzle, MD    Physical Exam: BP 117/84   Pulse 96   Temp 98 F (36.7 C) (Oral)   Resp 18   Ht 5' 8"  (1.727 m)   Wt 85.3 kg   SpO2 98%   BMI 28.59 kg/m   General: 50 y.o. year-old male well developed well nourished in no acute distress.  Alert and oriented x3. HEENT: Dry mucous membrane.  NCAT, EOMI Neck: Supple, trachea medial Cardiovascular: Regular rate and rhythm with no rubs or gallops.  No thyromegaly or JVD noted.  No lower extremity edema. 2/4 pulses in all 4 extremities. Respiratory: Clear to auscultation with no wheezes or rales. Good inspiratory effort. Abdomen: Soft, nontender nondistended with normal bowel sounds x4 quadrants. Muskuloskeletal: No cyanosis, clubbing or edema noted bilaterally Neuro: CN II-XII intact, strength 5/5 x 4, sensation, reflexes intact Skin: No ulcerative lesions noted or  rashes Psychiatry: Judgement and insight appear normal. Mood is appropriate for condition and setting          Labs on Admission:  Basic Metabolic Panel: Recent Labs  Lab 09/16/20 1800 09/22/20 1918 09/22/20 2107 09/22/20 2126  NA 135 119* 122* 124*  K 3.3* 2.6* 2.7* 2.9*  CL 96* 88* 90* 92*  CO2 19* 17* 18*  --   GLUCOSE 120* 150* 141* 136*  BUN 8 35* 37* 38*  CREATININE 0.71 1.46* 1.45* 1.50*  CALCIUM 9.5 8.5* 8.9  --    Liver Function Tests: Recent Labs  Lab 09/16/20 1800 09/22/20 1918 09/22/20 2107  AST 64* 69* 67*  ALT 48* 82* 80*  ALKPHOS 169* 197* 201*  BILITOT 0.7 2.4* 2.3*  PROT 8.2* 8.0 8.3*  ALBUMIN 4.5 4.5 4.6   Recent Labs  Lab 09/16/20 1800 09/22/20 1918  LIPASE <10* 135*   No results for input(s): AMMONIA in the last 168 hours. CBC: Recent Labs  Lab 09/16/20 1800 09/22/20 1918 09/22/20 2107 09/22/20 2126  WBC 6.0 9.8 9.5  --   NEUTROABS  --   --  6.3  --   HGB 19.4* 21.2* 21.6* 21.4*  HCT 53.5* RESULTS UNAVAILABLE DUE TO  INTERFERING SUBSTANCE 57.2* 63.0*  MCV 97.3 RESULTS UNAVAILABLE DUE TO INTERFERING SUBSTANCE 93.6  --   PLT 226 255 260  --    Cardiac Enzymes: No results for input(s): CKTOTAL, CKMB, CKMBINDEX, TROPONINI in the last 168 hours.  BNP (last 3 results) No results for input(s): BNP in the last 8760 hours.  ProBNP (last 3 results) No results for input(s): PROBNP in the last 8760 hours.  CBG: No results for input(s): GLUCAP in the last 168 hours.  Radiological Exams on Admission: No results found.  EKG: I independently viewed the EKG done and my findings are as followed: EKG was not done in the ED  Assessment/Plan Present on Admission:  Acute diarrhea  Abdominal pain  Alcoholic cirrhosis (HCC)  Elevated LFTs  Elevated bilirubin  Principal Problem:   Acute diarrhea Active Problems:   Abdominal pain   Elevated LFTs   Elevated bilirubin   Alcoholic cirrhosis (HCC)   Nausea & vomiting   GI bleed    Hyponatremia   Hypokalemia   Dehydration   AKI (acute kidney injury) ([place])   Nausea, vomiting and abdominal pain possibly secondary to an infectious diarrhea Patient's vomiting has since resolved, nausea persists Stool culture was positive for plesimonas shigelloides, Salmonella and Cryptosporidium per medical record and patient's was on azithromycin prior to being held.  He was also on trimethoprim and Cipro Continue IV hydration Continue Cipro Continue IV Zofran as needed Continue IV morphine 2 mg every 4 hours as needed for moderate/severe pain Consider infectious disease consult for worsening of symptoms  ?  GI bleed FOBT was positive; patient states that he was taking Pepto-Bismol with last dose being 2 days ago H/H 21.6/57.2 (baseline hemoglobin at 18.1-19.4) Patient denies noticing blood in stool Continue Protonix Continue to monitor H/H and consider consulting with GI for worsening hemoglobin/symptoms  Hyponatremia, Hypokalemia These may be due to vomiting, decreased oral intake, diarrhea Continue IV hydration and continue to monitor sodium levels Potassium will be replenished  Transaminitis and elevated total bilirubin History of alcoholic cirrhosis AST 67, ALT 80, ALP 201, total bilirubin 2.3 Continue to monitor liver enzymes  Acute kidney injury/dehydration BUN/creatinine 37/1.45 (baseline creatinine at 0.7-0.8) Continue IV hydration Renally adjust medications, avoid nephrotoxic agents/dehydration/hypotension    DVT prophylaxis: SCDs  Code Status: Full code  Family Communication: None at bedside  Disposition Plan:  Patient is from:                        home Anticipated DC to:                   SNF or family members home Anticipated DC date:               2-3 days Anticipated DC barriers:          Patient requires inpatient management due to infectious diarrhea and possible pending infectious disease consult    Consults called: None  Admission status:  Inpatient    Bernadette Hoit MD [facility]  09/23/2020, 2:00 AM

## 2020-09-22 NOTE — ED Notes (Signed)
Date and time results received: 09/22/20 2015  Test: Na Critical Value: 119  Test: potassium Critical Value: 2.6  Name of Provider Notified: Melina Copa, MD  Orders Received? Or Actions Taken?: acknowledged

## 2020-09-22 NOTE — Progress Notes (Signed)
Virtual Visit Consent   Christopher Carrillo, you are scheduled for a virtual visit with a [facility] provider today.     Just as with appointments in the office, your consent must be obtained to participate.  Your consent will be active for this visit and any virtual visit you may have with one of our providers in the next 365 days.     If you have a MyChart account, a copy of this consent can be sent to you electronically.  All virtual visits are billed to your insurance company just like a traditional visit in the office.    As this is a virtual visit, video technology does not allow for your provider to perform a traditional examination.  This may limit your provider's ability to fully assess your condition.  If your provider identifies any concerns that need to be evaluated in person or the need to arrange testing (such as labs, EKG, etc.), we will make arrangements to do so.     Although advances in technology are sophisticated, we cannot ensure that it will always work on either your end or our end.  If the connection with a video visit is poor, the visit may have to be switched to a telephone visit.  With either a video or telephone visit, we are not always able to ensure that we have a secure connection.     I need to obtain your verbal consent now.   Are you willing to proceed with your visit today?    Christopher Carrillo has provided verbal consent on 09/22/2020 for a virtual visit (video or telephone).   Leeanne Rio, [place]   Date: 09/22/2020 6:21 PM   Virtual Visit via Video Note   I, Leeanne Rio, connected with Christopher Carrillo ([id number], November 13, 1970) on 09/22/20 at  5:45 PM EDT by a video-enabled telemedicine application and verified that I am speaking with the correct person using two identifiers.  Location: Patient: Virtual Visit Location Patient: Home Provider: Virtual Visit Location Provider: Home Office   I discussed the limitations of evaluation and  management by telemedicine and the availability of in person appointments. The patient expressed understanding and agreed to proceed.    History of Present Illness: Christopher Carrillo is a 50 y.o. who identifies as a male who was assigned male at birth, and is being seen today for worsening diarrhea now with weakness and episodic dizziness. Patient initially presented to [facility] and was sent to ER facility for more acute evaluation. ER workup on 09/16/2020 included labs revealing mild elevation in LFT (hx alcoholic cirrhosis) with mild hypokalemia, hypochloremia and hemoglobin 19.4. UA with trace protein noted. Patient was given IV fluids and started on empiric Azithromycin for infectious diarrhea with stool cultures pending. Patient was then contacted on 09/17/2020 with stool pathogen results. Patient tested positive for Salmonella, Plesiomonas shigelloides and Cryptosporidium. As such had trimethoprim dded to his regimen per pharmacist recommendations. Symptoms continued to deteriorate and patient reached out to PCP on 09/19/2020 at which time he was instructed to stop the Azithromycin and Trimethoprim. Was started on Cipro BID x 3 days and a course of Nitazoxanide x 3 days. Patient endorses taking medications as directed but continues to have severe diarrhea, having a large void of watery stool every 15 minutes or so. Notes substantial nausea without residual vomiting. Notes significant LUQ pain. Denies melena, hematochezia or tenesmus. Notes urine is a darker yellow but having very little urinary output. Is trying to hydrate but feels  it runes through him. Patient with alcoholic cirrhosis -- denies any abdominal swelling.    Problems:  Patient Active Problem List   Diagnosis Date Noted   Polyarthralgia 01/08/2020   History of diverticulosis 10/28/2019   Internal and external bleeding hemorrhoids 08/20/2019   Colon ulcer    Hematochezia 08/12/2019   Leukocytosis 71/69/6789   Alcoholic cirrhosis  ([place])    Coagulopathy ([place])    Lower extremity edema 07/29/2019   Urinary hesitancy 07/29/2019   Anemia of chronic disease 07/29/2019   Drug-induced hepatic toxicity 07/25/2019   Elevated bilirubin    Elevated LFTs 07/14/2019   Hepatic steatosis 02/18/2019   GAD (generalized anxiety disorder) 02/18/2019   GERD (gastroesophageal reflux disease) 04/08/2013   Abdominal pain 04/07/2013   Hyperlipidemia 12/19/2012   Essential hypertension 12/19/2012   Routine general medical examination at a health care facility 08/07/2012   Psoriatic arthritis ([place]) 05/04/2012   Obese 05/04/2012   Tobacco use 05/04/2012    Allergies: No Known Allergies Medications:  Current Outpatient Medications:    ciprofloxacin (CIPRO) 500 MG tablet, Take 1 tablet (500 mg total) by mouth 2 (two) times daily for 3 days., Disp: 6 tablet, Rfl: 0   lactulose (CHRONULAC) 10 GM/15ML solution, TAKE 15 MILLILITERS BY MOUTH TWICE DAILY-REDUCE OR STOP TAKING IF MORE THAN 3 BOWEL MOVEMENTS A DAY, Disp: 946 mL, Rfl: 0   LORazepam (ATIVAN) 0.5 MG tablet, Take 1 tablet (0.5 mg total) by mouth 2 (two) times daily as needed for anxiety., Disp: 30 tablet, Rfl: 0   nitazoxanide (ALINIA) 500 MG tablet, Take 1 tablet (500 mg total) by mouth 2 (two) times daily with a meal., Disp: 6 tablet, Rfl: 0   trimethoprim (TRIMPEX) 100 MG tablet, Take 1 tablet (100 mg total) by mouth 2 (two) times daily for 5 days., Disp: 10 tablet, Rfl: 0  Observations/Objective: Patient is well-developed, ill-appearing. Resting  on cough at home.  Head is normocephalic, atraumatic.  No labored breathing.  Speech is clear and coherent with logical content.  Patient is alert and oriented at baseline.   Assessment and Plan: 1. Salmonellosis  2. Shigellosis  3. Cryptosporidial gastroenteritis ([place])  4. Dehydration  5. Dizziness  6. Alcoholic cirrhosis, unspecified whether ascites present [facility]) Patient deteriorating despite antibiotic therapy and  multiple changes in treatment regimen. Notes loss of around 20 lbs since last Wednesday. Afebrile and no bloody stool. Substantial abdominal pain and dizziness. ER disposition given. He agrees to have his wife take him to closest ER for evaluation ASAP. Will send copy of this note to PCP.   Follow Up Instructions: I discussed the assessment and treatment plan with the patient. The patient was provided an opportunity to ask questions and all were answered. The patient agreed with the plan and demonstrated an understanding of the instructions.   Time:  I spent 30 minutes with the patient via telehealth technology discussing the above problems/concerns.    Leeanne Rio, PA-C

## 2020-09-22 NOTE — ED Provider Notes (Signed)
[facility] EMERGENCY DEPARTMENT Provider Note   CSN: [id number] Arrival date & time: 09/22/20  1842     History Chief Complaint  Patient presents with   Abdominal Pain    Christopher Carrillo is a 50 y.o. male.  He said he returned from [place] about 12 days ago.  For 10 days he has had nausea diarrhea and abdominal pain.  For the first 2 days he also had vomiting.  He went to [facility] where he tested positive for plesimonas shigelloides, Salmonella and Cryptosporidium.  He was initially treated with Zithromax and then switched to trimethoprim and Cipro.  He continues to be troubled with diarrhea which she says is black.  No fevers.  Intermittent headaches.  Lightheadedness.  He said he is lost 20 pounds.  Using Gatorade to hydrate.  Anal area is sore from wiping so much. Urine is dark  The history is provided by the patient.  Abdominal Pain Pain location:  Generalized Pain quality: cramping   Pain severity:  Severe Onset quality:  Gradual Duration:  10 days Timing:  Intermittent Progression:  Unchanged Chronicity:  New Context: recent travel and suspicious food intake   Relieved by:  Nothing Ineffective treatments: antibiotics. Associated symptoms: diarrhea, nausea and vomiting   Associated symptoms: no chest pain, no constipation, no dysuria, no fever, no shortness of breath and no sore throat       Past Medical History:  Diagnosis Date   Abdominal pain    Alcoholic hepatitis without ascites    Anemia    Elevated bilirubin    Essential hypertension    Psoriasis    Transaminitis     Patient Active Problem List   Diagnosis Date Noted   Polyarthralgia 01/08/2020   History of diverticulosis 10/28/2019   Internal and external bleeding hemorrhoids 08/20/2019   Colon ulcer    Hematochezia 08/12/2019   Leukocytosis 44/31/5400   Alcoholic cirrhosis ([place])    Coagulopathy ([place])    Lower extremity edema 07/29/2019   Urinary hesitancy 07/29/2019   Anemia of chronic disease  07/29/2019   Drug-induced hepatic toxicity 07/25/2019   Elevated bilirubin    Elevated LFTs 07/14/2019   Hepatic steatosis 02/18/2019   GAD (generalized anxiety disorder) 02/18/2019   GERD (gastroesophageal reflux disease) 04/08/2013   Abdominal pain 04/07/2013   Hyperlipidemia 12/19/2012   Essential hypertension 12/19/2012   Routine general medical examination at a health care facility 08/07/2012   Psoriatic arthritis ([place]) 05/04/2012   Obese 05/04/2012   Tobacco use 05/04/2012    Past Surgical History:  Procedure Laterality Date   BIOPSY  07/20/2019   Procedure: BIOPSY;  Surgeon: Daneil Dolin, MD;  Location: [facility];  Service: Endoscopy;;   BIOPSY  08/14/2019   Procedure: BIOPSY;  Surgeon: Thornton Park, MD;  Location: [facility] ENDOSCOPY;  Service: Gastroenterology;;   COLONOSCOPY WITH PROPOFOL N/A 07/20/2019   Procedure: COLONOSCOPY WITH PROPOFOL;  Surgeon: Daneil Dolin, MD;  Location: [facility];  Service: Endoscopy;  Laterality: N/A;  10:15am   COLONOSCOPY WITH PROPOFOL N/A 08/14/2019   Procedure: COLONOSCOPY WITH PROPOFOL;  Surgeon: Thornton Park, MD;  Location: [place];  Service: Gastroenterology;  Laterality: N/A;   IR PARACENTESIS  08/14/2019   POLYPECTOMY  07/20/2019   Procedure: POLYPECTOMY;  Surgeon: Daneil Dolin, MD;  Location: [facility];  Service: Endoscopy;;   SUBMUCOSAL TATTOO INJECTION  08/14/2019   Procedure: SUBMUCOSAL TATTOO INJECTION;  Surgeon: Thornton Park, MD;  Location: [place];  Service: Gastroenterology;;  Family History  Problem Relation Age of Onset   Asthma Father    Cancer Sister 73       Pharyngeal   Colon cancer Neg Hx    Liver disease Neg Hx     Social History   Tobacco Use   Smoking status: Every Day    Packs/day: 0.33    Pack years: 0.00    Types: Cigarettes   Smokeless tobacco: Never  Vaping Use   Vaping Use: Never used  Substance Use Topics   Alcohol use: Yes    Alcohol/week: 1.0 standard  drink    Types: 1 Cans of beer per week    Comment: occasionally   Drug use: No    Home Medications Prior to Admission medications   Medication Sig Start Date End Date Taking? Authorizing Provider  ciprofloxacin (CIPRO) 500 MG tablet Take 1 tablet (500 mg total) by mouth 2 (two) times daily for 3 days. 09/19/20 09/22/20  Susy Frizzle, MD  lactulose (CHRONULAC) 10 GM/15ML solution TAKE 15 MILLILITERS BY MOUTH TWICE DAILY-REDUCE OR STOP TAKING IF MORE THAN 3 BOWEL MOVEMENTS A DAY 06/10/20   [place], Modena Nunnery, MD  LORazepam (ATIVAN) 0.5 MG tablet Take 1 tablet (0.5 mg total) by mouth 2 (two) times daily as needed for anxiety. 08/22/20   Susy Frizzle, MD  nitazoxanide (ALINIA) 500 MG tablet Take 1 tablet (500 mg total) by mouth 2 (two) times daily with a meal. 09/19/20   Susy Frizzle, MD    Allergies    Patient has no known allergies.  Review of Systems   Review of Systems  Constitutional:  Negative for fever.  HENT:  Negative for sore throat.   Eyes:  Negative for visual disturbance.  Respiratory:  Negative for shortness of breath.   Cardiovascular:  Negative for chest pain.  Gastrointestinal:  Positive for abdominal pain, diarrhea, nausea and vomiting. Negative for constipation.  Genitourinary:  Negative for dysuria.  Musculoskeletal:  Negative for neck pain.  Skin:  Negative for rash.  Neurological:  Positive for headaches.   Physical Exam Updated Vital Signs BP 118/84   Pulse (!) 101   Temp 98 F (36.7 C) (Oral)   Resp 20   Ht 5' 8"  (1.727 m)   Wt 85.3 kg   SpO2 98%   BMI 28.59 kg/m   Physical Exam Vitals and nursing note reviewed.  Constitutional:      Appearance: Normal appearance. He is well-developed.  HENT:     Head: Normocephalic and atraumatic.  Eyes:     Conjunctiva/sclera: Conjunctivae normal.  Cardiovascular:     Rate and Rhythm: Normal rate and regular rhythm.     Heart sounds: No murmur heard. Pulmonary:     Effort: Pulmonary effort is  normal. No respiratory distress.     Breath sounds: Normal breath sounds.  Abdominal:     Palpations: Abdomen is soft.     Tenderness: There is generalized abdominal tenderness. There is no guarding or rebound.  Musculoskeletal:        General: No deformity or signs of injury. Normal range of motion.     Cervical back: Neck supple.  Skin:    General: Skin is warm and dry.  Neurological:     General: No focal deficit present.     Mental Status: He is alert.    ED Results / Procedures / Treatments   Labs (all labs ordered are listed, but only abnormal results are displayed) Labs Reviewed  LIPASE,  BLOOD - Abnormal; Notable for the following components:      Result Value   Lipase 135 (*)    All other components within normal limits  COMPREHENSIVE METABOLIC PANEL - Abnormal; Notable for the following components:   Sodium 119 (*)    Potassium 2.6 (*)    Chloride 88 (*)    CO2 17 (*)    Glucose, Bld 150 (*)    BUN 35 (*)    Creatinine, Ser 1.46 (*)    Calcium 8.5 (*)    AST 69 (*)    ALT 82 (*)    Alkaline Phosphatase 197 (*)    Total Bilirubin 2.4 (*)    GFR, Estimated 59 (*)    All other components within normal limits  CBC - Abnormal; Notable for the following components:   Hemoglobin 21.2 (*)    All other components within normal limits  URINALYSIS, ROUTINE W REFLEX MICROSCOPIC - Abnormal; Notable for the following components:   Specific Gravity, Urine >1.030 (*)    Bilirubin Urine MODERATE (*)    Protein, ur 100 (*)    All other components within normal limits  URINALYSIS, MICROSCOPIC (REFLEX) - Abnormal; Notable for the following components:   Bacteria, UA RARE (*)    All other components within normal limits  CBC WITH DIFFERENTIAL/PLATELET  COMPREHENSIVE METABOLIC PANEL  PROTIME-INR  I-STAT CHEM 8, ED  POC OCCULT BLOOD, ED  TYPE AND SCREEN    EKG None  Radiology No results found.  Procedures Procedures   Medications Ordered in ED Medications  0.9 %   sodium chloride infusion ( Intravenous Stopped 09/23/20 0647)  morphine 2 MG/ML injection 2 mg (2 mg Intravenous Given 09/23/20 0937)  ondansetron (ZOFRAN) injection 4 mg (has no administration in time range)  pantoprazole (PROTONIX) injection 40 mg (40 mg Intravenous Given 09/23/20 1017)  ciprofloxacin (CIPRO) tablet 500 mg (500 mg Oral Given 09/23/20 0936)  0.9 % NaCl with KCl 40 mEq / L  infusion (has no administration in time range)  morphine 4 MG/ML injection 4 mg (4 mg Intravenous Given 09/22/20 2133)  sodium chloride 0.9 % bolus 1,000 mL (0 mLs Intravenous Stopped 09/22/20 2332)  ondansetron (ZOFRAN) injection 4 mg (4 mg Intravenous Given 09/22/20 2133)  pantoprazole (PROTONIX) injection 40 mg (40 mg Intravenous Given 09/22/20 2133)  potassium chloride 10 mEq in 100 mL IVPB (0 mEq Intravenous Stopped 09/23/20 0447)  0.9 %  sodium chloride infusion ( Intravenous Stopped 09/23/20 0448)    ED Course  I have reviewed the triage vital signs and the nursing notes.  Pertinent labs & imaging results that were available during my care of the patient were reviewed by me and considered in my medical decision making (see chart for details).  Clinical Course as of 09/23/20 1054  Thu Sep 22, 2020  2303 Discussed with [facility] hospitalist Dr. Josephine Cables who will evaluate for admission. [MB]  2308 Paged infectious disease twice and not received a call back. [MB]    Clinical Course User Index [MB] Hayden Rasmussen, MD   MDM Rules/Calculators/A&P                         This patient complains of frequent diarrhea and generalized abdominal pain in the setting of recent return from [place]; this involves an extensive number of treatment Options and is a complaint that carries with it a high risk of complications and Morbidity. The differential includes infectious gastroenteritis, obstruction, dehydration, metabolic  derangement, GI bleed  I ordered, reviewed and interpreted labs, which included CBC with normal  white count, hemoglobin elevated likely reflecting some volume contracture, chemistries with low sodium low chloride low bicarb elevated renal function elevated LFTs, C. difficile and stool studies ordered I ordered medication IV fluids, IV pain medication and nausea medication Previous records obtained and reviewed in epic including prior [facility] ED visit with positive stool studies I consulted Dr. Josephine Cables [facility] hospitalist and discussed lab and imaging findings  Critical Interventions: None  After the interventions stated above, I reevaluated the patient and found patient to be hemodynamically stable and nontoxic-appearing.  Due to his metabolic derangement I feel it would be better served by being admitted to the hospital for IV hydration.  Patient agreement with plan.   Final Clinical Impression(s) / ED Diagnoses Final diagnoses:  Infectious diarrhea in adult patient  Hypokalemia  Hyponatremia    Rx / DC Orders ED Discharge Orders     None        Hayden Rasmussen, MD 09/23/20 1101

## 2020-09-23 ENCOUNTER — Encounter (HOSPITAL_COMMUNITY): Payer: Self-pay | Admitting: Internal Medicine

## 2020-09-23 DIAGNOSIS — K922 Gastrointestinal hemorrhage, unspecified: Secondary | ICD-10-CM | POA: Diagnosis present

## 2020-09-23 DIAGNOSIS — E876 Hypokalemia: Secondary | ICD-10-CM | POA: Diagnosis present

## 2020-09-23 DIAGNOSIS — E871 Hypo-osmolality and hyponatremia: Secondary | ICD-10-CM | POA: Diagnosis present

## 2020-09-23 DIAGNOSIS — R112 Nausea with vomiting, unspecified: Secondary | ICD-10-CM | POA: Diagnosis present

## 2020-09-23 DIAGNOSIS — N179 Acute kidney failure, unspecified: Secondary | ICD-10-CM | POA: Diagnosis present

## 2020-09-23 DIAGNOSIS — E860 Dehydration: Secondary | ICD-10-CM | POA: Diagnosis present

## 2020-09-23 LAB — HIV ANTIBODY (ROUTINE TESTING W REFLEX): HIV Screen 4th Generation wRfx: NONREACTIVE

## 2020-09-23 LAB — MAGNESIUM: Magnesium: 1.7 mg/dL (ref 1.7–2.4)

## 2020-09-23 LAB — GASTROINTESTINAL PANEL BY PCR, STOOL (REPLACES STOOL CULTURE)

## 2020-09-23 LAB — CBC
Hemoglobin: 19.4 g/dL — ABNORMAL HIGH (ref 13.0–17.0)
Platelets: 196 10*3/uL (ref 150–400)
WBC: 7.5 10*3/uL (ref 4.0–10.5)

## 2020-09-23 LAB — COMPREHENSIVE METABOLIC PANEL
ALT: 61 U/L — ABNORMAL HIGH (ref 0–44)
AST: 49 U/L — ABNORMAL HIGH (ref 15–41)
Albumin: 3.7 g/dL (ref 3.5–5.0)
Alkaline Phosphatase: 150 U/L — ABNORMAL HIGH (ref 38–126)
Anion gap: 13 (ref 5–15)
BUN: 31 mg/dL — ABNORMAL HIGH (ref 6–20)
CO2: 16 mmol/L — ABNORMAL LOW (ref 22–32)
Calcium: 7.8 mg/dL — ABNORMAL LOW (ref 8.9–10.3)
Chloride: 95 mmol/L — ABNORMAL LOW (ref 98–111)
Creatinine, Ser: 1.19 mg/dL (ref 0.61–1.24)
GFR, Estimated: >60 mL/min (ref >60)
Glucose, Bld: 105 mg/dL — ABNORMAL HIGH (ref 70–99)
Potassium: 3 mmol/L — ABNORMAL LOW (ref 3.5–5.1)
Sodium: 124 mmol/L — ABNORMAL LOW (ref 135–145)
Total Bilirubin: 1.7 mg/dL — ABNORMAL HIGH (ref 0.3–1.2)
Total Protein: 6.6 g/dL (ref 6.5–8.1)

## 2020-09-23 LAB — PHOSPHORUS: Phosphorus: 2.3 mg/dL — ABNORMAL LOW (ref 2.5–4.6)

## 2020-09-23 LAB — C DIFFICILE QUICK SCREEN W PCR REFLEX
C Diff antigen: NEGATIVE
C Diff interpretation: NOT DETECTED
C Diff toxin: NEGATIVE

## 2020-09-23 LAB — PROTIME-INR
INR: 1.3 — ABNORMAL HIGH (ref 0.8–1.2)
Prothrombin Time: 15.8 seconds — ABNORMAL HIGH (ref 11.4–15.2)

## 2020-09-23 LAB — APTT: aPTT: 37 seconds — ABNORMAL HIGH (ref 24–36)

## 2020-09-23 MED ORDER — CIPROFLOXACIN HCL 250 MG PO TABS
500.0000 mg | ORAL_TABLET | Freq: Two times a day (BID) | ORAL | Status: DC
  Administered 2020-09-23 – 2020-09-24 (×3): 500 mg via ORAL
  Filled 2020-09-23 (×3): qty 2

## 2020-09-23 MED ORDER — BOOST / RESOURCE BREEZE PO LIQD CUSTOM
1.0000 | Freq: Three times a day (TID) | ORAL | Status: DC
  Administered 2020-09-23 – 2020-09-26 (×3): 1 via ORAL

## 2020-09-23 MED ORDER — LOPERAMIDE HCL 2 MG PO CAPS
2.0000 mg | ORAL_CAPSULE | Freq: Four times a day (QID) | ORAL | Status: DC | PRN
  Administered 2020-09-24 – 2020-09-25 (×5): 2 mg via ORAL
  Filled 2020-09-23 (×5): qty 1

## 2020-09-23 MED ORDER — SODIUM CHLORIDE 0.9 % IV SOLN: Freq: Once | INTRAVENOUS | Status: AC

## 2020-09-23 MED ORDER — CIPROFLOXACIN HCL 250 MG PO TABS: 500.0000 mg | ORAL_TABLET | Freq: Two times a day (BID) | ORAL | Status: DC

## 2020-09-23 MED ORDER — POTASSIUM CHLORIDE IN NACL 40-0.9 MEQ/L-% IV SOLN
INTRAVENOUS | Status: AC
  Filled 2020-09-23 (×9): qty 1000

## 2020-09-23 MED ORDER — MORPHINE SULFATE (PF) 2 MG/ML IV SOLN
2.0000 mg | INTRAVENOUS | Status: DC | PRN
  Administered 2020-09-23 – 2020-09-27 (×21): 2 mg via INTRAVENOUS
  Filled 2020-09-23 (×21): qty 1

## 2020-09-23 MED ORDER — PANTOPRAZOLE SODIUM 40 MG IV SOLR
40.0000 mg | INTRAVENOUS | Status: DC
  Administered 2020-09-23 – 2020-09-24 (×2): 40 mg via INTRAVENOUS
  Filled 2020-09-23 (×2): qty 40

## 2020-09-23 MED ORDER — ONDANSETRON HCL 4 MG/2ML IJ SOLN: 4.0000 mg | Freq: Four times a day (QID) | INTRAMUSCULAR | Status: DC | PRN

## 2020-09-23 MED ORDER — POTASSIUM CHLORIDE CRYS ER 20 MEQ PO TBCR
30.0000 meq | EXTENDED_RELEASE_TABLET | Freq: Two times a day (BID) | ORAL | Status: DC
  Administered 2020-09-23: 30 meq via ORAL
  Filled 2020-09-23 (×2): qty 1

## 2020-09-23 NOTE — Progress Notes (Signed)
Initial Nutrition Assessment  INTERVENTION:  Boost Breeze po TID, each supplement provides 250 kcal and 9 grams of protein   Follow for diet advancement  NUTRITION DIAGNOSIS:   Inadequate oral intake related to acute illness (gastroenteritis) as evidenced by per patient/family report.   GOAL:   Patient will meet greater than or equal to 90% of their needs   MONITOR:   Diet advancement, PO intake, Supplement acceptance  REASON FOR ASSESSMENT:   Malnutrition Screening Tool    ASSESSMENT: patient is a 50 yo male who presents with diarrhea since 6/8, metabolic acidosis, AKI, Hypokalemia and acute gastroenteritis. Patient history includes alcoholic liver cirrhosis.  Patient tolerating clear liquids. Asking for [employer]. He has Boost Breeze ordered and is going to trial both flavors.   Usual weight ~ 200 lb. Currently 192 lb - acute loss likely related to his recent diarrhea and decreased oral intake.   Medications:Protonix, Klor-con, Cipro  IVF- NaCL with  KCL@ 100 ml/hr.   Labs: BMP Latest Ref Rng & Units 09/23/2020 09/22/2020 09/22/2020  Glucose 70 - 99 mg/dL 105(H) 136(H) 141(H)  BUN 6 - 20 mg/dL 31(H) 38(H) 37(H)  Creatinine 0.61 - 1.24 mg/dL 1.19 1.50(H) 1.45(H)  BUN/Creat Ratio 6 - 22 (calc) - - -  Sodium 135 - 145 mmol/L 124(L) 124(L) 122(L)  Potassium 3.5 - 5.1 mmol/L 3.0(L) 2.9(L) 2.7(LL)  Chloride 98 - 111 mmol/L 95(L) 92(L) 90(L)  CO2 22 - 32 mmol/L 16(L) - 18(L)  Calcium 8.9 - 10.3 mg/dL 7.8(L) - 8.9     NUTRITION - FOCUSED PHYSICAL EXAM:  Unable to complete Nutrition-Focused physical exam at this time.  Patient fully dressed walking around in room.   Diet Order:   Diet Order             Diet clear liquid Room service appropriate? Yes; Fluid consistency: Thin  Diet effective now                   EDUCATION NEEDS:   Not appropriate for education at this time  Skin:  Skin Assessment: Reviewed RN Assessment  Last BM:  PTA  Height:   Ht  Readings from Last 1 Encounters:  09/23/20 5' 8"  (1.727 m)    Weight:   Wt Readings from Last 1 Encounters:  09/23/20 87.3 kg    Ideal Body Weight:   70 kg  BMI:  Body mass index is 29.26 kg/m.  Estimated Nutritional Needs:   Kcal:  3235-5732  Protein:  98-113 gr  Fluid:  >2.2 liters daily  Colman Cater MS,RD,CSG,LDN Contact: Shea Evans

## 2020-09-23 NOTE — Progress Notes (Signed)
Progress Note    Christopher Carrillo  CHY:[id number] DOB: 01-31-1971  DOA: 09/22/2020 PCP: Susy Frizzle, MD      Brief Narrative:    Medical records reviewed and are as summarized below:  Christopher Carrillo is a 50 y.o. male with medical history significant for alcoholic liver cirrhosis who presented to the hospital because of nausea, abdominal pain and watery diarrhea of about 9 days duration.  He recently returned from [place] on 09/11/2020 after staying there for about 2 weeks.  He thinks his symptoms developed on Wednesday, 09/14/2020.  He has been having watery diarrhea since that time.  He went to [facility] health emergency room and a GI panel detected Plesimonas shigelloides, Salmonella species and Cryptosporidium.  He was initially treated with Bactrim and azithromycin but there was no improvement in his symptoms.  He contacted his PCP on 09/19/2020 and he was told to stop Bactrim and azithromycin and take ciprofloxacin and nitazoxanide for 3 days.  Despite this, he continued to have diarrhea.  He took Pepto-Bismol and he believes that tended his stools dark.  He has lost about 20 pounds since his symptoms started.  He was found to have hypovolemic hyponatremia, non-anion gap metabolic acidosis, AKI, hypokalemia from acute gastroenteritis.    Assessment/Plan:   Principal Problem:   Acute diarrhea Active Problems:   Abdominal pain   Elevated LFTs   Elevated bilirubin   Alcoholic cirrhosis (HCC)   Nausea & vomiting   GI bleed   Hyponatremia   Hypokalemia   Dehydration   AKI (acute kidney injury) ([place])   Body mass index is 28.59 kg/m.   Hypovolemic hyponatremia: Treated with IV fluids.  Monitor BMP  AKI: Continue IV fluids and monitor BMP.  Hypokalemia: Replete potassium intravenously and orally.  Hypophosphatemia: Replete phosphorus with Neutra-Phos.  Acute gastroenteritis: Imodium as needed.  Alcoholic liver cirrhosis: Compensated.  Elevated liver  enzymes may be due to liver cirrhosis.  Polycythemia: Monitor CBC   Diet Order             Diet clear liquid Room service appropriate? Yes; Fluid consistency: Thin  Diet effective now                      Consultants: None  Procedures: None    Medications:    ciprofloxacin  500 mg Oral BID   pantoprazole (PROTONIX) IV  40 mg Intravenous Q24H   Continuous Infusions:  sodium chloride Stopped (09/23/20 0647)   0.9 % NaCl with KCl 40 mEq / L 100 mL/hr at 09/23/20 1108     Anti-infectives (From admission, onward)    Start     Dose/Rate Route Frequency Ordered Stop   09/23/20 0800  ciprofloxacin (CIPRO) tablet 500 mg  Status:  Discontinued        500 mg Oral 2 times daily 09/23/20 0222 09/23/20 0223   09/23/20 0800  ciprofloxacin (CIPRO) tablet 500 mg        500 mg Oral 2 times daily 09/23/20 0223 10/01/20 0759              Family Communication/Anticipated D/C date and plan/Code Status   DVT prophylaxis: SCDs Start: 09/22/20 2314     Code Status: Full Code  Family Communication: None Disposition Plan:    Status is: Inpatient  Remains inpatient appropriate because:IV treatments appropriate due to intensity of illness or inability to take PO  Dispo: The patient is from: Home  Anticipated d/c is to: Home              Patient currently is not medically stable to d/c.   Difficult to place patient No           Subjective:   C/o watery diarrhea.  No blood in the stool.  No vomiting.  Abdominal pain is better.  Objective:    Vitals:   09/23/20 0512 09/23/20 0915 09/23/20 1130 09/23/20 1405  BP: 132/82 133/86 107/74 118/88  Pulse: 92 95 93 88  Resp: 18 18    Temp:      TempSrc:      SpO2: 98% 98% 95% 98%  Weight:      Height:       No data found.   Intake/Output Summary (Last 24 hours) at 09/23/2020 1456 Last data filed at 09/23/2020 0448 Gross per 24 hour  Intake 1659.9 ml  Output --  Net 1659.9 ml   Filed  Weights   09/22/20 1857  Weight: 85.3 kg    Exam:  GEN: NAD SKIN: No rash EYES: EOMI ENT: MMM CV: RRR PULM: CTA B ABD: soft, ND, NT, +BS CNS: AAO x 3, non focal EXT: No edema or tenderness        Data Reviewed:   I have personally reviewed following labs and imaging studies:  Labs: Labs show the following:   Basic Metabolic Panel: Recent Labs  Lab 09/16/20 1800 09/22/20 1918 09/22/20 2107 09/22/20 2126 09/23/20 0357  NA 135 119* 122* 124* 124*  K 3.3* 2.6* 2.7* 2.9* 3.0*  CL 96* 88* 90* 92* 95*  CO2 19* 17* 18*  --  16*  GLUCOSE 120* 150* 141* 136* 105*  BUN 8 35* 37* 38* 31*  CREATININE 0.71 1.46* 1.45* 1.50* 1.19  CALCIUM 9.5 8.5* 8.9  --  7.8*  MG  --   --   --   --  1.7  PHOS  --   --   --   --  2.3*   GFR Estimated Creatinine Clearance: 80.7 mL/min (by C-G formula based on SCr of 1.19 mg/dL). Liver Function Tests: Recent Labs  Lab 09/16/20 1800 09/22/20 1918 09/22/20 2107 09/23/20 0357  AST 64* 69* 67* 49*  ALT 48* 82* 80* 61*  ALKPHOS 169* 197* 201* 150*  BILITOT 0.7 2.4* 2.3* 1.7*  PROT 8.2* 8.0 8.3* 6.6  ALBUMIN 4.5 4.5 4.6 3.7   Recent Labs  Lab 09/16/20 1800 09/22/20 1918  LIPASE <10* 135*   No results for input(s): AMMONIA in the last 168 hours. Coagulation profile Recent Labs  Lab 09/23/20 0357  INR 1.3*    CBC: Recent Labs  Lab 09/16/20 1800 09/22/20 1918 09/22/20 2107 09/22/20 2126 09/23/20 0357  WBC 6.0 9.8 9.5  --  7.5  NEUTROABS  --   --  6.3  --   --   HGB 19.4* 21.2* 21.6* 21.4* 19.4*  HCT 53.5* RESULTS UNAVAILABLE DUE TO INTERFERING SUBSTANCE 57.2* 63.0* RESULTS UNAVAILABLE DUE TO INTERFERING SUBSTANCE  MCV 97.3 RESULTS UNAVAILABLE DUE TO INTERFERING SUBSTANCE 93.6  --  RESULTS UNAVAILABLE DUE TO INTERFERING SUBSTANCE  PLT 226 255 260  --  196   Cardiac Enzymes: No results for input(s): CKTOTAL, CKMB, CKMBINDEX, TROPONINI in the last 168 hours. BNP (last 3 results) No results for input(s): PROBNP in  the last 8760 hours. CBG: No results for input(s): GLUCAP in the last 168 hours. D-Dimer: No results for input(s): DDIMER in the last 72 hours. Hgb  A1c: No results for input(s): HGBA1C in the last 72 hours. Lipid Profile: No results for input(s): CHOL, HDL, LDLCALC, TRIG, CHOLHDL, LDLDIRECT in the last 72 hours. Thyroid function studies: No results for input(s): TSH, T4TOTAL, T3FREE, THYROIDAB in the last 72 hours.  Invalid input(s): FREET3 Anemia work up: No results for input(s): VITAMINB12, FOLATE, FERRITIN, TIBC, IRON, RETICCTPCT in the last 72 hours. Sepsis Labs: Recent Labs  Lab 09/16/20 1800 09/22/20 1918 09/22/20 2107 09/23/20 0357  WBC 6.0 9.8 9.5 7.5    Microbiology Recent Results (from the past 240 hour(s))  Gastrointestinal Panel by PCR , Stool     Status: Abnormal   Collection Time: 09/16/20  6:19 PM   Specimen: Stool  Result Value Ref Range Status   Campylobacter species NOT DETECTED NOT DETECTED Final   Plesimonas shigelloides DETECTED (A) NOT DETECTED Final    Comment: RESULT CALLED TO, READ BACK BY AND VERIFIED WITH: NEEDHAM,MARY CN AT 1500 ON 09/17/2020 BY MOSLEY,J    Salmonella species DETECTED (A) NOT DETECTED Final    Comment: RESULT CALLED TO, READ BACK BY AND VERIFIED WITH: NEEDHAM,MARY CN AT 1500 ON 09/17/2020 BY MOSLEY,J    Yersinia enterocolitica NOT DETECTED NOT DETECTED Final   Vibrio species NOT DETECTED NOT DETECTED Final   Vibrio cholerae NOT DETECTED NOT DETECTED Final   Enteroaggregative E coli (EAEC) NOT DETECTED NOT DETECTED Final   Enteropathogenic E coli (EPEC) NOT DETECTED NOT DETECTED Final   Enterotoxigenic E coli (ETEC) NOT DETECTED NOT DETECTED Final   Shiga like toxin producing E coli (STEC) NOT DETECTED NOT DETECTED Final   Shigella/Enteroinvasive E coli (EIEC) NOT DETECTED NOT DETECTED Final   Cryptosporidium DETECTED (A) NOT DETECTED Final    Comment: RESULT CALLED TO, READ BACK BY AND VERIFIED WITH: NEEDHAM,MARY CN AT 1500  ON 09/17/2020 BY MOSLEY,J    Cyclospora cayetanensis NOT DETECTED NOT DETECTED Final   Entamoeba histolytica NOT DETECTED NOT DETECTED Final   Giardia lamblia NOT DETECTED NOT DETECTED Final   Adenovirus F40/41 NOT DETECTED NOT DETECTED Final   Astrovirus NOT DETECTED NOT DETECTED Final   Norovirus GI/GII NOT DETECTED NOT DETECTED Final   Rotavirus A NOT DETECTED NOT DETECTED Final   Sapovirus (I, II, IV, and V) NOT DETECTED NOT DETECTED Final    Comment: Performed at [facility], [address] [place], [place] [address]  Resp Panel by RT-PCR (Flu A&B, Covid) Nasopharyngeal Swab     Status: None   Collection Time: 09/22/20  9:25 PM   Specimen: Nasopharyngeal Swab; Nasopharyngeal(NP) swabs in vial transport medium  Result Value Ref Range Status   SARS Coronavirus 2 by RT PCR NEGATIVE NEGATIVE Final    Comment: (NOTE) SARS-CoV-2 target nucleic acids are NOT DETECTED.  The SARS-CoV-2 RNA is generally detectable in upper respiratory specimens during the acute phase of infection. The lowest concentration of SARS-CoV-2 viral copies this assay can detect is 138 copies/mL. A negative result does not preclude SARS-Cov-2 infection and should not be used as the sole basis for treatment or other patient management decisions. A negative result may occur with  improper specimen collection/handling, submission of specimen other than nasopharyngeal swab, presence of viral mutation(s) within the areas targeted by this assay, and inadequate number of viral copies(<138 copies/mL). A negative result must be combined with clinical observations, patient history, and epidemiological information. The expected result is Negative.  Fact Sheet for Patients:  EntrepreneurPulse.com.au  Fact Sheet for Healthcare Providers:  IncredibleEmployment.be  This test is no t  yet approved or cleared by the [place] and  has been authorized for detection and/or  diagnosis of SARS-CoV-2 by FDA under an Emergency Use Authorization (EUA). This EUA will remain  in effect (meaning this test can be used) for the duration of the COVID-19 declaration under Section 564(b)(1) of the Act, 21 U.S.C.section 360bbb-3(b)(1), unless the authorization is terminated  or revoked sooner.       Influenza A by PCR NEGATIVE NEGATIVE Final   Influenza B by PCR NEGATIVE NEGATIVE Final    Comment: (NOTE) The Xpert Xpress SARS-CoV-2/FLU/RSV plus assay is intended as an aid in the diagnosis of influenza from Nasopharyngeal swab specimens and should not be used as a sole basis for treatment. Nasal washings and aspirates are unacceptable for Xpert Xpress SARS-CoV-2/FLU/RSV testing.  Fact Sheet for Patients: EntrepreneurPulse.com.au  Fact Sheet for Healthcare Providers: IncredibleEmployment.be  This test is not yet approved or cleared by the [place] FDA and has been authorized for detection and/or diagnosis of SARS-CoV-2 by FDA under an Emergency Use Authorization (EUA). This EUA will remain in effect (meaning this test can be used) for the duration of the COVID-19 declaration under Section 564(b)(1) of the Act, 21 U.S.C. section 360bbb-3(b)(1), unless the authorization is terminated or revoked.  Performed at [facility], [address], [place], [place] [address]   C Difficile Quick Screen w PCR reflex     Status: None   Collection Time: 09/23/20 12:33 AM   Specimen: Stool  Result Value Ref Range Status   C Diff antigen NEGATIVE NEGATIVE Final   C Diff toxin NEGATIVE NEGATIVE Final   C Diff interpretation No C. difficile detected.  Final    Comment: Performed at [facility], [address], [place], [place] [address]    Procedures and diagnostic studies:  No results found.             LOS: 1 day   Markeita Alicia  [facility]   Pager on [email or web address]. If 7PM-7AM, please contact night-coverage at  [email or web address]     09/23/2020, 2:56 PM

## 2020-09-24 MED ORDER — POTASSIUM CHLORIDE CRYS ER 20 MEQ PO TBCR
40.0000 meq | EXTENDED_RELEASE_TABLET | Freq: Once | ORAL | Status: AC
  Administered 2020-09-24: 40 meq via ORAL
  Filled 2020-09-24: qty 2

## 2020-09-24 MED ORDER — SODIUM BICARBONATE 650 MG PO TABS
1300.0000 mg | ORAL_TABLET | Freq: Three times a day (TID) | ORAL | Status: AC
  Administered 2020-09-24 – 2020-09-25 (×3): 1300 mg via ORAL
  Filled 2020-09-24 (×3): qty 2

## 2020-09-24 NOTE — Progress Notes (Addendum)
Progress Note    Christopher Carrillo  GEZ:[id number] DOB: 01-27-1971  DOA: 09/22/2020 PCP: Susy Frizzle, MD      Brief Narrative:    Medical records reviewed and are as summarized below:  Christopher Carrillo is a 50 y.o. male with medical history significant for alcoholic liver cirrhosis who presented to the hospital because of nausea, abdominal pain and watery diarrhea of about 9 days duration.  He recently returned from [place] on 09/11/2020 after staying there for about 2 weeks.  He thinks his symptoms developed on Wednesday, 09/14/2020.  He has been having watery diarrhea since that time.  He went to [facility] health emergency room and a GI panel done on 09/16/2020 detected Plesimonas shigelloides, Salmonella species and Cryptosporidium.  He was initially treated with Bactrim and azithromycin but there was no improvement in his symptoms.  He contacted his PCP on 09/19/2020 and he was told to stop Bactrim and azithromycin and take ciprofloxacin and nitazoxanide for 3 days.  Despite this, he continued to have diarrhea.  He took Pepto-Bismol and he believes that tended his stools dark.  He has lost about 20 pounds since his symptoms started.  He was found to have hypovolemic hyponatremia, non-anion gap metabolic acidosis, AKI, hypokalemia from acute gastroenteritis.    Assessment/Plan:   Principal Problem:   Acute diarrhea Active Problems:   Abdominal pain   Elevated LFTs   Elevated bilirubin   Alcoholic cirrhosis (HCC)   Nausea & vomiting   GI bleed   Hyponatremia   Hypokalemia   Dehydration   AKI (acute kidney injury) ([place])   Body mass index is 29.26 kg/m.   Hypovolemic hyponatremia: Sodium level still stuck at 124.  Increase IV fluid rate to 125 cc/h.  AKI: Creatinine has improved.  Continue IV fluids because of hyponatremia.  Hypokalemia: Potassium is still low.  Continue IV and oral potassium repletion.  Hypophosphatemia: Replete phosphorus with  Neutra-Phos.  Non anion gap metabolic acidosis: This is likely from diarrhea.  Start oral sodium bicarbonate.  Acute gastroenteritis: GI panel showed Cryptosporidium.  Salmonella and Plesimonas shigelloides that were previously identified on GI panel from 09/16/2020 and no longer present.  Use Imodium as needed for diarrhea.  Alcoholic liver cirrhosis: Compensated.  Elevated liver enzymes are improving.  Polycythemia: Dehydration may be contributing to polycythemia.  Monitor CBC   Diet Order             Diet regular Room service appropriate? Yes; Fluid consistency: Thin  Diet effective now                      Consultants: None  Procedures: None    Medications:    feeding supplement  1 Container Oral TID BM   potassium chloride  40 mEq Oral Once   Continuous Infusions:  sodium chloride Stopped (09/23/20 0647)   0.9 % NaCl with KCl 40 mEq / L 100 mL/hr at 09/24/20 0846     Anti-infectives (From admission, onward)    Start     Dose/Rate Route Frequency Ordered Stop   09/23/20 0800  ciprofloxacin (CIPRO) tablet 500 mg  Status:  Discontinued        500 mg Oral 2 times daily 09/23/20 0222 09/23/20 0223   09/23/20 0800  ciprofloxacin (CIPRO) tablet 500 mg  Status:  Discontinued        500 mg Oral 2 times daily 09/23/20 0223 09/24/20 0930  Family Communication/Anticipated D/C date and plan/Code Status   DVT prophylaxis: SCDs Start: 09/22/20 2314     Code Status: Full Code  Family Communication: None Disposition Plan:    Status is: Inpatient  Remains inpatient appropriate because:IV treatments appropriate due to intensity of illness or inability to take PO  Dispo: The patient is from: Home              Anticipated d/c is to: Home              Patient currently is not medically stable to d/c.   Difficult to place patient No           Subjective:   C/o watery diarrhea.  He said he had about "twenty" loose stools since I  last saw him yesterday.  Stools are watery and nonbloody.  No vomiting or abdominal pain.  Objective:    Vitals:   09/23/20 1405 09/23/20 1508 09/23/20 2218 09/24/20 0547  BP: 118/88 121/87 114/76 113/75  Pulse: 88  96 88  Resp:  18 18 18   Temp:  98.4 F (36.9 C) 98 F (36.7 C) 97.8 F (36.6 C)  TempSrc:  Oral Oral   SpO2: 98%  99% 100%  Weight:  87.3 kg    Height:  5' 8"  (1.727 m)     No data found.   Intake/Output Summary (Last 24 hours) at 09/24/2020 0931 Last data filed at 09/24/2020 0500 Gross per 24 hour  Intake 1881.71 ml  Output --  Net 1881.71 ml   Filed Weights   09/22/20 1857 09/23/20 1508  Weight: 85.3 kg 87.3 kg    Exam:  GEN: NAD SKIN: No rash EYES: EOMI ENT: MMM CV: RRR PULM: CTA B ABD: soft, ND, NT, +BS CNS: AAO x 3, non focal EXT: No edema or tenderness        Data Reviewed:   I have personally reviewed following labs and imaging studies:  Labs: Labs show the following:   Basic Metabolic Panel: Recent Labs  Lab 09/22/20 1918 09/22/20 2107 09/22/20 2126 09/23/20 0357  NA 119* 122* 124* 124*  K 2.6* 2.7* 2.9* 3.0*  CL 88* 90* 92* 95*  CO2 17* 18*  --  16*  GLUCOSE 150* 141* 136* 105*  BUN 35* 37* 38* 31*  CREATININE 1.46* 1.45* 1.50* 1.19  CALCIUM 8.5* 8.9  --  7.8*  MG  --   --   --  1.7  PHOS  --   --   --  2.3*   GFR Estimated Creatinine Clearance: 81.6 mL/min (by C-G formula based on SCr of 1.19 mg/dL). Liver Function Tests: Recent Labs  Lab 09/22/20 1918 09/22/20 2107 09/23/20 0357  AST 69* 67* 49*  ALT 82* 80* 61*  ALKPHOS 197* 201* 150*  BILITOT 2.4* 2.3* 1.7*  PROT 8.0 8.3* 6.6  ALBUMIN 4.5 4.6 3.7   Recent Labs  Lab 09/22/20 1918  LIPASE 135*   No results for input(s): AMMONIA in the last 168 hours. Coagulation profile Recent Labs  Lab 09/23/20 0357  INR 1.3*    CBC: Recent Labs  Lab 09/22/20 1918 09/22/20 2107 09/22/20 2126 09/23/20 0357  WBC 9.8 9.5  --  7.5  NEUTROABS  --  6.3  --    --   HGB 21.2* 21.6* 21.4* 19.4*  HCT RESULTS UNAVAILABLE DUE TO INTERFERING SUBSTANCE 57.2* 63.0* RESULTS UNAVAILABLE DUE TO INTERFERING SUBSTANCE  MCV RESULTS UNAVAILABLE DUE TO INTERFERING SUBSTANCE 93.6  --  RESULTS UNAVAILABLE DUE  TO INTERFERING SUBSTANCE  PLT 255 260  --  196   Cardiac Enzymes: No results for input(s): CKTOTAL, CKMB, CKMBINDEX, TROPONINI in the last 168 hours. BNP (last 3 results) No results for input(s): PROBNP in the last 8760 hours. CBG: No results for input(s): GLUCAP in the last 168 hours. D-Dimer: No results for input(s): DDIMER in the last 72 hours. Hgb A1c: No results for input(s): HGBA1C in the last 72 hours. Lipid Profile: No results for input(s): CHOL, HDL, LDLCALC, TRIG, CHOLHDL, LDLDIRECT in the last 72 hours. Thyroid function studies: No results for input(s): TSH, T4TOTAL, T3FREE, THYROIDAB in the last 72 hours.  Invalid input(s): FREET3 Anemia work up: No results for input(s): VITAMINB12, FOLATE, FERRITIN, TIBC, IRON, RETICCTPCT in the last 72 hours. Sepsis Labs: Recent Labs  Lab 09/22/20 1918 09/22/20 2107 09/23/20 0357  WBC 9.8 9.5 7.5    Microbiology Recent Results (from the past 240 hour(s))  Gastrointestinal Panel by PCR , Stool     Status: Abnormal   Collection Time: 09/16/20  6:19 PM   Specimen: Stool  Result Value Ref Range Status   Campylobacter species NOT DETECTED NOT DETECTED Final   Plesimonas shigelloides DETECTED (A) NOT DETECTED Final    Comment: RESULT CALLED TO, READ BACK BY AND VERIFIED WITH: NEEDHAM,MARY CN AT 1500 ON 09/17/2020 BY MOSLEY,J    Salmonella species DETECTED (A) NOT DETECTED Final    Comment: RESULT CALLED TO, READ BACK BY AND VERIFIED WITH: NEEDHAM,MARY CN AT 1500 ON 09/17/2020 BY MOSLEY,J    Yersinia enterocolitica NOT DETECTED NOT DETECTED Final   Vibrio species NOT DETECTED NOT DETECTED Final   Vibrio cholerae NOT DETECTED NOT DETECTED Final   Enteroaggregative E coli (EAEC) NOT DETECTED NOT  DETECTED Final   Enteropathogenic E coli (EPEC) NOT DETECTED NOT DETECTED Final   Enterotoxigenic E coli (ETEC) NOT DETECTED NOT DETECTED Final   Shiga like toxin producing E coli (STEC) NOT DETECTED NOT DETECTED Final   Shigella/Enteroinvasive E coli (EIEC) NOT DETECTED NOT DETECTED Final   Cryptosporidium DETECTED (A) NOT DETECTED Final    Comment: RESULT CALLED TO, READ BACK BY AND VERIFIED WITH: NEEDHAM,MARY CN AT 1500 ON 09/17/2020 BY MOSLEY,J    Cyclospora cayetanensis NOT DETECTED NOT DETECTED Final   Entamoeba histolytica NOT DETECTED NOT DETECTED Final   Giardia lamblia NOT DETECTED NOT DETECTED Final   Adenovirus F40/41 NOT DETECTED NOT DETECTED Final   Astrovirus NOT DETECTED NOT DETECTED Final   Norovirus GI/GII NOT DETECTED NOT DETECTED Final   Rotavirus A NOT DETECTED NOT DETECTED Final   Sapovirus (I, II, IV, and V) NOT DETECTED NOT DETECTED Final    Comment: Performed at [facility], [address] [place], [place] [address]  Resp Panel by RT-PCR (Flu A&B, Covid) Nasopharyngeal Swab     Status: None   Collection Time: 09/22/20  9:25 PM   Specimen: Nasopharyngeal Swab; Nasopharyngeal(NP) swabs in vial transport medium  Result Value Ref Range Status   SARS Coronavirus 2 by RT PCR NEGATIVE NEGATIVE Final    Comment: (NOTE) SARS-CoV-2 target nucleic acids are NOT DETECTED.  The SARS-CoV-2 RNA is generally detectable in upper respiratory specimens during the acute phase of infection. The lowest concentration of SARS-CoV-2 viral copies this assay can detect is 138 copies/mL. A negative result does not preclude SARS-Cov-2 infection and should not be used as the sole basis for treatment or other patient management decisions. A negative result may occur with  improper specimen collection/handling, submission of specimen  other than nasopharyngeal swab, presence of viral mutation(s) within the areas targeted by this assay, and inadequate number of viral copies(<138  copies/mL). A negative result must be combined with clinical observations, patient history, and epidemiological information. The expected result is Negative.  Fact Sheet for Patients:  EntrepreneurPulse.com.au  Fact Sheet for Healthcare Providers:  IncredibleEmployment.be  This test is no t yet approved or cleared by the [place] FDA and  has been authorized for detection and/or diagnosis of SARS-CoV-2 by FDA under an Emergency Use Authorization (EUA). This EUA will remain  in effect (meaning this test can be used) for the duration of the COVID-19 declaration under Section 564(b)(1) of the Act, 21 U.S.C.section 360bbb-3(b)(1), unless the authorization is terminated  or revoked sooner.       Influenza A by PCR NEGATIVE NEGATIVE Final   Influenza B by PCR NEGATIVE NEGATIVE Final    Comment: (NOTE) The Xpert Xpress SARS-CoV-2/FLU/RSV plus assay is intended as an aid in the diagnosis of influenza from Nasopharyngeal swab specimens and should not be used as a sole basis for treatment. Nasal washings and aspirates are unacceptable for Xpert Xpress SARS-CoV-2/FLU/RSV testing.  Fact Sheet for Patients: EntrepreneurPulse.com.au  Fact Sheet for Healthcare Providers: IncredibleEmployment.be  This test is not yet approved or cleared by the [place] FDA and has been authorized for detection and/or diagnosis of SARS-CoV-2 by FDA under an Emergency Use Authorization (EUA). This EUA will remain in effect (meaning this test can be used) for the duration of the COVID-19 declaration under Section 564(b)(1) of the Act, 21 U.S.C. section 360bbb-3(b)(1), unless the authorization is terminated or revoked.  Performed at [facility], [address], [place], [place] [address]   Gastrointestinal Panel by PCR , Stool     Status: Abnormal   Collection Time: 09/23/20 12:33 AM   Specimen: Stool  Result Value Ref Range  Status   Campylobacter species NOT DETECTED NOT DETECTED Final   Plesimonas shigelloides NOT DETECTED NOT DETECTED Final   Salmonella species NOT DETECTED NOT DETECTED Final   Yersinia enterocolitica NOT DETECTED NOT DETECTED Final   Vibrio species NOT DETECTED NOT DETECTED Final   Vibrio cholerae NOT DETECTED NOT DETECTED Final   Enteroaggregative E coli (EAEC) NOT DETECTED NOT DETECTED Final   Enteropathogenic E coli (EPEC) NOT DETECTED NOT DETECTED Final   Enterotoxigenic E coli (ETEC) NOT DETECTED NOT DETECTED Final   Shiga like toxin producing E coli (STEC) NOT DETECTED NOT DETECTED Final   Shigella/Enteroinvasive E coli (EIEC) NOT DETECTED NOT DETECTED Final   Cryptosporidium DETECTED (A) NOT DETECTED Final   Cyclospora cayetanensis NOT DETECTED NOT DETECTED Final   Entamoeba histolytica NOT DETECTED NOT DETECTED Final   Giardia lamblia NOT DETECTED NOT DETECTED Final   Adenovirus F40/41 NOT DETECTED NOT DETECTED Final   Astrovirus NOT DETECTED NOT DETECTED Final   Norovirus GI/GII NOT DETECTED NOT DETECTED Final   Rotavirus A NOT DETECTED NOT DETECTED Final   Sapovirus (I, II, IV, and V) NOT DETECTED NOT DETECTED Final    Comment: Performed at [facility], [address] [place], [place] [address]  C Difficile Quick Screen w PCR reflex     Status: None   Collection Time: 09/23/20 12:33 AM   Specimen: Stool  Result Value Ref Range Status   C Diff antigen NEGATIVE NEGATIVE Final   C Diff toxin NEGATIVE NEGATIVE Final   C Diff interpretation No C. difficile detected.  Final    Comment: Performed at [facility]  [address] [place], [place] [address]    Procedures and diagnostic studies:  No results found.             LOS: 2 days   Alicha Raspberry  [facility]   Pager on [email or web address]. If 7PM-7AM, please contact night-coverage at [email or web address]     09/24/2020, 9:31 AM

## 2020-09-25 LAB — CBC WITH DIFFERENTIAL/PLATELET
Band Neutrophils: 5 %
Basophils Absolute: 0 10*3/uL (ref 0.0–0.1)
Basophils Relative: 0 %
Eosinophils Absolute: 0.3 10*3/uL (ref 0.0–0.5)
Eosinophils Relative: 4 %
HCT: 42.6 % (ref 39.0–52.0)
Hemoglobin: 15.4 g/dL (ref 13.0–17.0)
Lymphocytes Relative: 19 %
Lymphs Abs: 1.3 10*3/uL (ref 0.7–4.0)
MCH: 35.2 pg — ABNORMAL HIGH (ref 26.0–34.0)
MCHC: 36.2 g/dL — ABNORMAL HIGH (ref 30.0–36.0)
MCV: 97.3 fL (ref 80.0–100.0)
Monocytes Absolute: 1.5 10*3/uL — ABNORMAL HIGH (ref 0.1–1.0)
Monocytes Relative: 22 %
Neutro Abs: 3.7 10*3/uL (ref 1.7–7.7)
Neutrophils Relative %: 50 %
Platelets: 161 10*3/uL (ref 150–400)
RBC: 4.38 MIL/uL (ref 4.22–5.81)
RDW: 13.9 % (ref 11.5–15.5)
WBC: 6.8 10*3/uL (ref 4.0–10.5)
nRBC: 0 % (ref 0.0–0.2)

## 2020-09-25 LAB — COMPREHENSIVE METABOLIC PANEL
ALT: 48 U/L — ABNORMAL HIGH (ref 0–44)
AST: 39 U/L (ref 15–41)
Albumin: 3.1 g/dL — ABNORMAL LOW (ref 3.5–5.0)
Alkaline Phosphatase: 135 U/L — ABNORMAL HIGH (ref 38–126)
Anion gap: 5 (ref 5–15)
BUN: 9 mg/dL (ref 6–20)
CO2: 17 mmol/L — ABNORMAL LOW (ref 22–32)
Calcium: 7.6 mg/dL — ABNORMAL LOW (ref 8.9–10.3)
Chloride: 105 mmol/L (ref 98–111)
Creatinine, Ser: 0.71 mg/dL (ref 0.61–1.24)
GFR, Estimated: >60 mL/min (ref >60)
Glucose, Bld: 100 mg/dL — ABNORMAL HIGH (ref 70–99)
Potassium: 3.5 mmol/L (ref 3.5–5.1)
Sodium: 127 mmol/L — ABNORMAL LOW (ref 135–145)
Total Bilirubin: 1 mg/dL (ref 0.3–1.2)
Total Protein: 5.6 g/dL — ABNORMAL LOW (ref 6.5–8.1)

## 2020-09-25 LAB — MAGNESIUM: Magnesium: 1.3 mg/dL — ABNORMAL LOW (ref 1.7–2.4)

## 2020-09-25 LAB — PHOSPHORUS: Phosphorus: 1 mg/dL — CL (ref 2.5–4.6)

## 2020-09-25 MED ORDER — CHOLESTYRAMINE 4 G PO PACK
4.0000 g | PACK | ORAL | Status: DC
  Administered 2020-09-25 – 2020-09-27 (×5): 4 g via ORAL
  Filled 2020-09-25 (×12): qty 1

## 2020-09-25 MED ORDER — LORAZEPAM 0.5 MG PO TABS
0.5000 mg | ORAL_TABLET | Freq: Two times a day (BID) | ORAL | Status: DC | PRN
  Administered 2020-09-27 – 2020-09-29 (×3): 0.5 mg via ORAL
  Filled 2020-09-25 (×4): qty 1

## 2020-09-25 MED ORDER — K PHOS MONO-SOD PHOS DI & MONO 155-852-130 MG PO TABS
500.0000 mg | ORAL_TABLET | Freq: Three times a day (TID) | ORAL | Status: DC
  Administered 2020-09-25 – 2020-09-30 (×16): 500 mg via ORAL
  Filled 2020-09-25 (×16): qty 2

## 2020-09-25 MED ORDER — MAGNESIUM SULFATE 4 GM/100ML IV SOLN
4.0000 g | Freq: Once | INTRAVENOUS | Status: AC
  Administered 2020-09-25: 4 g via INTRAVENOUS
  Filled 2020-09-25: qty 100

## 2020-09-25 MED ORDER — CHOLESTYRAMINE LIGHT 4 G PO PACK
PACK | ORAL | Status: AC
  Filled 2020-09-25: qty 1

## 2020-09-25 MED ORDER — LOPERAMIDE HCL 2 MG PO CAPS
2.0000 mg | ORAL_CAPSULE | Freq: Four times a day (QID) | ORAL | Status: DC
  Administered 2020-09-25 – 2020-09-29 (×14): 2 mg via ORAL
  Filled 2020-09-25 (×15): qty 1

## 2020-09-25 NOTE — Progress Notes (Signed)
Christopher Carrillo  XTG:[id number] DOB: 10/03/1970 DOA: 09/22/2020 PCP: Susy Frizzle, MD    Brief Narrative:  50 year old with a history of alcoholic liver cirrhosis who presented to the hospital with nausea, watery diarrhea, and abdominal pain for 9 days.  He returned from [place] 09/11/2020 after a 2-week visit.  He stated his symptoms began 09/14/2020.  He was seen at [facility] 6/10 and a GI pathogen panel revealed Plesimonas shigelloides, Salmonella species, and Cryptosporidium.  He was treated with Bactrim and azithromycin without improvement.  He contacted his PCP 09/19/2020 and was changed to Cipro and nitazoxanide.  Despite this change he reported no significant clinical improvement.  Upon his final presentation to the [facility] ED he was found to be hyponatremic, hypokalemic, acidotic, hypokalemic, and in acute kidney injury.  Significant Events:  6/5 returned after 2-week visit to [place] 6/8 symptom onset 6/10 positive GI pathogen [place] - abx initiated  6/13 abx changed by PCP 6/16 admit via [facility] ED   Consultants:  None  Code Status: FULL CODE  Antimicrobials:  Cipro 6/17 > 6/19  DVT prophylaxis: SCDs  Subjective: Afebrile.  Vital signs stable.  Sodium slowly improving.  Magnesium remains quite low.  Phosphorus remains quite low.  Continues to have multiple purely watery stools daily.  Assessment & Plan:  Acute infectious gastroenteritis Repeat GI panel notes Cryptosporidium only - previously noted Salmonella and Plesimonas likely eradicated with outpatient antibiotic -in immunocompetent host specific treatment of Cryptosporidium not typically indicated -stop Cipro and follow with adjustment in antidiarrheal treatment  Alcoholic liver cirrhosis Appears reasonably well compensated at present  Hyponatremia Felt to be due to significant hypovolemia - improving w/ IVF support   Acute kidney injury Appears primarily prerenal - resolved w/  volume expansion   Hypokalemia Supplement - due to GI losses   Hypophosphatemia Cont to supplement via oral route for now   Hypomagnesemia Due to Gi losses - supplement via IV and follow   Non anion gap metabolic acidosis  Polycythemia Due to severe dehydration/hemoconcentration - resolved   Family Communication:  Status is: Inpatient  Remains inpatient appropriate because:Inpatient level of care appropriate due to severity of illness  Dispo: The patient is from: Home              Anticipated d/c is to: Home              Patient currently is not medically stable to d/c.   Difficult to place patient No   Objective: Blood pressure 121/76, pulse 98, temperature 98.6 F (37 C), temperature source Oral, resp. rate 18, height 5' 8"  (1.727 m), weight 87.3 kg, SpO2 100 %.  Intake/Output Summary (Last 24 hours) at 09/25/2020 0849 Last data filed at 09/24/2020 1900 Gross per 24 hour  Intake 1475.5 ml  Output --  Net 1475.5 ml   Filed Weights   09/22/20 1857 09/23/20 1508  Weight: 85.3 kg 87.3 kg    Examination: General: No acute respiratory distress Lungs: Clear to auscultation bilaterally without wheezes or crackles Cardiovascular: Regular rate and rhythm without murmur gallop or rub normal S1 and S2 Abdomen: Nontender, nondistended, soft, bowel sounds positive, no rebound, no ascites, no appreciable mass Extremities: No significant cyanosis, clubbing, or edema bilateral lower extremities  CBC: Recent Labs  Lab 09/22/20 2107 09/22/20 2126 09/23/20 0357 09/25/20 0718  WBC 9.5  --  7.5 6.8  NEUTROABS 6.3  --   --  3.7  HGB 21.6* 21.4* 19.4*  15.4  HCT 57.2* 63.0* RESULTS UNAVAILABLE DUE TO INTERFERING SUBSTANCE 42.6  MCV 93.6  --  RESULTS UNAVAILABLE DUE TO INTERFERING SUBSTANCE 97.3  PLT 260  --  196 242   Basic Metabolic Panel: Recent Labs  Lab 09/22/20 2107 09/22/20 2126 09/23/20 0357 09/25/20 0718  NA 122* 124* 124* 127*  K 2.7* 2.9* 3.0* 3.5  CL 90*  92* 95* 105  CO2 18*  --  16* 17*  GLUCOSE 141* 136* 105* 100*  BUN 37* 38* 31* 9  CREATININE 1.45* 1.50* 1.19 0.71  CALCIUM 8.9  --  7.8* 7.6*  MG  --   --  1.7 1.3*  PHOS  --   --  2.3* 1.0*   GFR: Estimated Creatinine Clearance: 121.4 mL/min (by C-G formula based on SCr of 0.71 mg/dL).  Liver Function Tests: Recent Labs  Lab 09/22/20 1918 09/22/20 2107 09/23/20 0357 09/25/20 0718  AST 69* 67* 49* 39  ALT 82* 80* 61* 48*  ALKPHOS 197* 201* 150* 135*  BILITOT 2.4* 2.3* 1.7* 1.0  PROT 8.0 8.3* 6.6 5.6*  ALBUMIN 4.5 4.6 3.7 3.1*   Recent Labs  Lab 09/22/20 1918  LIPASE 135*   Coagulation Profile: Recent Labs  Lab 09/23/20 0357  INR 1.3*    HgbA1C: Hgb A1c MFr Bld  Date/Time Value Ref Range Status  04/05/2020 12:13 PM 5.7 (H) <5.7 % of total Hgb Final    Comment:    For someone without known diabetes, a hemoglobin  A1c value between 5.7% and 6.4% is consistent with prediabetes and should be confirmed with a  follow-up test. . For someone with known diabetes, a value <7% indicates that their diabetes is well controlled. A1c targets should be individualized based on duration of diabetes, age, comorbid conditions, and other considerations. . This assay result is consistent with an increased risk of diabetes. . Currently, no consensus exists regarding use of hemoglobin A1c for diagnosis of diabetes for children. Marland Kitchen   10/26/2019 04:24 PM 4.9 <5.7 % of total Hgb Final    Comment:    For the purpose of screening for the presence of diabetes: . <5.7%       Consistent with the absence of diabetes 5.7-6.4%    Consistent with increased risk for diabetes             (prediabetes) > or =6.5%  Consistent with diabetes . This assay result is consistent with a decreased risk of diabetes. . Currently, no consensus exists regarding use of hemoglobin A1c for diagnosis of diabetes in children. . According to American Diabetes Association (ADA) guidelines,  hemoglobin A1c <7.0% represents optimal control in non-pregnant diabetic patients. Different metrics may apply to specific patient populations.  Standards of Medical Care in Diabetes(ADA). Marland Kitchen      Recent Results (from the past 240 hour(s))  Gastrointestinal Panel by PCR , Stool     Status: Abnormal   Collection Time: 09/16/20  6:19 PM   Specimen: Stool  Result Value Ref Range Status   Campylobacter species NOT DETECTED NOT DETECTED Final   Plesimonas shigelloides DETECTED (A) NOT DETECTED Final    Comment: RESULT CALLED TO, READ BACK BY AND VERIFIED WITH: NEEDHAM,MARY CN AT 1500 ON 09/17/2020 BY MOSLEY,J    Salmonella species DETECTED (A) NOT DETECTED Final    Comment: RESULT CALLED TO, READ BACK BY AND VERIFIED WITH: NEEDHAM,MARY CN AT 1500 ON 09/17/2020 BY MOSLEY,J    Yersinia enterocolitica NOT DETECTED NOT DETECTED Final   Vibrio species  NOT DETECTED NOT DETECTED Final   Vibrio cholerae NOT DETECTED NOT DETECTED Final   Enteroaggregative E coli (EAEC) NOT DETECTED NOT DETECTED Final   Enteropathogenic E coli (EPEC) NOT DETECTED NOT DETECTED Final   Enterotoxigenic E coli (ETEC) NOT DETECTED NOT DETECTED Final   Shiga like toxin producing E coli (STEC) NOT DETECTED NOT DETECTED Final   Shigella/Enteroinvasive E coli (EIEC) NOT DETECTED NOT DETECTED Final   Cryptosporidium DETECTED (A) NOT DETECTED Final    Comment: RESULT CALLED TO, READ BACK BY AND VERIFIED WITH: NEEDHAM,MARY CN AT 1500 ON 09/17/2020 BY MOSLEY,J    Cyclospora cayetanensis NOT DETECTED NOT DETECTED Final   Entamoeba histolytica NOT DETECTED NOT DETECTED Final   Giardia lamblia NOT DETECTED NOT DETECTED Final   Adenovirus F40/41 NOT DETECTED NOT DETECTED Final   Astrovirus NOT DETECTED NOT DETECTED Final   Norovirus GI/GII NOT DETECTED NOT DETECTED Final   Rotavirus A NOT DETECTED NOT DETECTED Final   Sapovirus (I, II, IV, and V) NOT DETECTED NOT DETECTED Final    Comment: Performed at [facility],  [address] [place], [place] [address]  Resp Panel by RT-PCR (Flu A&B, Covid) Nasopharyngeal Swab     Status: None   Collection Time: 09/22/20  9:25 PM   Specimen: Nasopharyngeal Swab; Nasopharyngeal(NP) swabs in vial transport medium  Result Value Ref Range Status   SARS Coronavirus 2 by RT PCR NEGATIVE NEGATIVE Final    Comment: (NOTE) SARS-CoV-2 target nucleic acids are NOT DETECTED.  The SARS-CoV-2 RNA is generally detectable in upper respiratory specimens during the acute phase of infection. The lowest concentration of SARS-CoV-2 viral copies this assay can detect is 138 copies/mL. A negative result does not preclude SARS-Cov-2 infection and should not be used as the sole basis for treatment or other patient management decisions. A negative result may occur with  improper specimen collection/handling, submission of specimen other than nasopharyngeal swab, presence of viral mutation(s) within the areas targeted by this assay, and inadequate number of viral copies(<138 copies/mL). A negative result must be combined with clinical observations, patient history, and epidemiological information. The expected result is Negative.  Fact Sheet for Patients:  EntrepreneurPulse.com.au  Fact Sheet for Healthcare Providers:  IncredibleEmployment.be  This test is no t yet approved or cleared by the [place] FDA and  has been authorized for detection and/or diagnosis of SARS-CoV-2 by FDA under an Emergency Use Authorization (EUA). This EUA will remain  in effect (meaning this test can be used) for the duration of the COVID-19 declaration under Section 564(b)(1) of the Act, 21 U.S.C.section 360bbb-3(b)(1), unless the authorization is terminated  or revoked sooner.       Influenza A by PCR NEGATIVE NEGATIVE Final   Influenza B by PCR NEGATIVE NEGATIVE Final    Comment: (NOTE) The Xpert Xpress SARS-CoV-2/FLU/RSV plus assay is intended as an  aid in the diagnosis of influenza from Nasopharyngeal swab specimens and should not be used as a sole basis for treatment. Nasal washings and aspirates are unacceptable for Xpert Xpress SARS-CoV-2/FLU/RSV testing.  Fact Sheet for Patients: EntrepreneurPulse.com.au  Fact Sheet for Healthcare Providers: IncredibleEmployment.be  This test is not yet approved or cleared by the [place] FDA and has been authorized for detection and/or diagnosis of SARS-CoV-2 by FDA under an Emergency Use Authorization (EUA). This EUA will remain in effect (meaning this test can be used) for the duration of the COVID-19 declaration under Section 564(b)(1) of the Act, 21 U.S.C. section 360bbb-3(b)(1), unless the  authorization is terminated or revoked.  Performed at [facility], [address] [place], [place] [address]   Gastrointestinal Panel by PCR , Stool     Status: Abnormal   Collection Time: 09/23/20 12:33 AM   Specimen: Stool  Result Value Ref Range Status   Campylobacter species NOT DETECTED NOT DETECTED Final   Plesimonas shigelloides NOT DETECTED NOT DETECTED Final   Salmonella species NOT DETECTED NOT DETECTED Final   Yersinia enterocolitica NOT DETECTED NOT DETECTED Final   Vibrio species NOT DETECTED NOT DETECTED Final   Vibrio cholerae NOT DETECTED NOT DETECTED Final   Enteroaggregative E coli (EAEC) NOT DETECTED NOT DETECTED Final   Enteropathogenic E coli (EPEC) NOT DETECTED NOT DETECTED Final   Enterotoxigenic E coli (ETEC) NOT DETECTED NOT DETECTED Final   Shiga like toxin producing E coli (STEC) NOT DETECTED NOT DETECTED Final   Shigella/Enteroinvasive E coli (EIEC) NOT DETECTED NOT DETECTED Final   Cryptosporidium DETECTED (A) NOT DETECTED Final   Cyclospora cayetanensis NOT DETECTED NOT DETECTED Final   Entamoeba histolytica NOT DETECTED NOT DETECTED Final   Giardia lamblia NOT DETECTED NOT DETECTED Final   Adenovirus F40/41 NOT DETECTED  NOT DETECTED Final   Astrovirus NOT DETECTED NOT DETECTED Final   Norovirus GI/GII NOT DETECTED NOT DETECTED Final   Rotavirus A NOT DETECTED NOT DETECTED Final   Sapovirus (I, II, IV, and V) NOT DETECTED NOT DETECTED Final    Comment: Performed at [facility], [address] [place], [place] [address]  C Difficile Quick Screen w PCR reflex     Status: None   Collection Time: 09/23/20 12:33 AM   Specimen: Stool  Result Value Ref Range Status   C Diff antigen NEGATIVE NEGATIVE Final   C Diff toxin NEGATIVE NEGATIVE Final   C Diff interpretation No C. difficile detected.  Final    Comment: Performed at [facility], [address], [place], [place] [address]     Scheduled Meds:  feeding supplement  1 Container Oral TID BM   sodium bicarbonate  1,300 mg Oral TID   Continuous Infusions:  sodium chloride Stopped (09/23/20 0647)   0.9 % NaCl with KCl 40 mEq / L 125 mL/hr at 09/24/20 1026     LOS: 3 days   Cherene Altes, MD [facility] Office  [phone] Pager - Text Page per Shea Evans  If 7PM-7AM, please contact night-coverage per Amion 09/25/2020, 8:49 AM

## 2020-09-26 LAB — COMPREHENSIVE METABOLIC PANEL
ALT: 38 U/L (ref 0–44)
AST: 30 U/L (ref 15–41)
Albumin: 2.9 g/dL — ABNORMAL LOW (ref 3.5–5.0)
Alkaline Phosphatase: 132 U/L — ABNORMAL HIGH (ref 38–126)
Anion gap: 3 — ABNORMAL LOW (ref 5–15)
BUN: 6 mg/dL (ref 6–20)
CO2: 16 mmol/L — ABNORMAL LOW (ref 22–32)
Calcium: 7.5 mg/dL — ABNORMAL LOW (ref 8.9–10.3)
Chloride: 109 mmol/L (ref 98–111)
Creatinine, Ser: 0.54 mg/dL — ABNORMAL LOW (ref 0.61–1.24)
GFR, Estimated: >60 mL/min (ref >60)
Glucose, Bld: 99 mg/dL (ref 70–99)
Potassium: 3.6 mmol/L (ref 3.5–5.1)
Sodium: 128 mmol/L — ABNORMAL LOW (ref 135–145)
Total Bilirubin: 0.9 mg/dL (ref 0.3–1.2)
Total Protein: 5.3 g/dL — ABNORMAL LOW (ref 6.5–8.1)

## 2020-09-26 LAB — LIPASE, BLOOD: Lipase: 199 U/L — ABNORMAL HIGH (ref 11–51)

## 2020-09-26 LAB — CBC
HCT: 42 % (ref 39.0–52.0)
Hemoglobin: 15.3 g/dL (ref 13.0–17.0)
MCH: 35.5 pg — ABNORMAL HIGH (ref 26.0–34.0)
MCHC: 36.4 g/dL — ABNORMAL HIGH (ref 30.0–36.0)
MCV: 97.4 fL (ref 80.0–100.0)
Platelets: 166 10*3/uL (ref 150–400)
RBC: 4.31 MIL/uL (ref 4.22–5.81)
RDW: 13.9 % (ref 11.5–15.5)
WBC: 6.6 10*3/uL (ref 4.0–10.5)
nRBC: 0 % (ref 0.0–0.2)

## 2020-09-26 LAB — MAGNESIUM: Magnesium: 1.7 mg/dL (ref 1.7–2.4)

## 2020-09-26 LAB — PHOSPHORUS: Phosphorus: 1.6 mg/dL — ABNORMAL LOW (ref 2.5–4.6)

## 2020-09-26 MED ORDER — ALUM & MAG HYDROXIDE-SIMETH 200-200-20 MG/5ML PO SUSP
30.0000 mL | ORAL | Status: DC | PRN
  Administered 2020-09-26: 30 mL via ORAL
  Filled 2020-09-26: qty 30

## 2020-09-26 MED ORDER — CHOLESTYRAMINE 4 G PO PACK: 4.0000 g | PACK | Freq: Three times a day (TID) | ORAL | 0 refills | Status: DC

## 2020-09-26 MED ORDER — LOPERAMIDE HCL 2 MG PO CAPS: 2.0000 mg | ORAL_CAPSULE | Freq: Four times a day (QID) | ORAL | 0 refills | Status: DC

## 2020-09-26 MED ORDER — LACTULOSE 10 GM/15ML PO SOLN: 10.0000 g | Freq: Two times a day (BID) | ORAL | 0 refills | Status: DC

## 2020-09-26 NOTE — Progress Notes (Signed)
Christopher Carrillo  YYQ:[id number] DOB: 09/19/1970 DOA: 09/22/2020 PCP: Susy Frizzle, MD    Brief Narrative:  50 year old with a history of alcoholic liver cirrhosis who presented to the hospital with nausea, watery diarrhea, and abdominal pain for 9 days.  He returned from [place] 09/11/2020 after a 2-week visit.  He stated his symptoms began 09/14/2020.  He was seen at [facility] 6/10 and a GI pathogen panel revealed Plesimonas shigelloides, Salmonella species, and Cryptosporidium.  He was treated with Bactrim and azithromycin without improvement.  He contacted his PCP 09/19/2020 and was changed to Cipro and nitazoxanide.  Despite this change he reported no significant clinical improvement.  Upon his final presentation to the [facility] ED he was found to be hyponatremic, hypokalemic, acidotic, hypokalemic, and in acute kidney injury.  Significant Events:  6/5 returned after 2-week visit to [place] 6/8 symptom onset 6/10 positive GI pathogen [place] - abx initiated  6/13 abx changed by PCP 6/16 admit via [facility] ED   Consultants:  None  Code Status: FULL CODE  Antimicrobials:  Cipro 6/17 > 6/19  DVT prophylaxis: SCDs  Subjective: Afebrile.  Vital signs stable.  Potassium normalized.  Magnesium improved to 1.7.  Phosphorus improved to 1.6.  With adjustments made yesterday patient has had significant decrease in frequency of stools and his stools have become more formed.  His appetite is improving as is his intake.  Assessment & Plan:  Acute infectious gastroenteritis Repeat GI panel noted Cryptosporidium only - previously noted Salmonella and Plesimonas likely eradicated with outpatient antibiotic -in immunocompetent host specific treatment of Cryptosporidium not typically indicated -stopped Cipro -improving with adjustment in antidiarrheal treatment -plan to discharge home in a.m. if remains clinically stable without return of profuse watery  diarrhea  Alcoholic liver cirrhosis Appears reasonably well compensated at present - lactulose on hold for obvious reasons  Hyponatremia Felt to be due to significant hypovolemia as well as cirrhosis -has stabilized  Acute kidney injury Appears to have been primarily prerenal - resolved w/ volume expansion   Hypokalemia Corrected with supplementation and improving intake  Hypophosphatemia Improved with supplementation and improving intake  Hypomagnesemia Due to Gi losses -corrected with supplementation and improving intake  Non anion gap metabolic acidosis Stable  Polycythemia Due to severe dehydration/hemoconcentration - resolved   Family Communication:  Status is: Inpatient  Remains inpatient appropriate because:Inpatient level of care appropriate due to severity of illness  Dispo: The patient is from: Home              Anticipated d/c is to: Home              Patient currently is not medically stable to d/c.   Difficult to place patient No   Objective: Blood pressure 125/78, pulse 90, temperature 97.9 F (36.6 C), temperature source Oral, resp. rate 18, height 5' 8"  (1.727 m), weight 87.3 kg, SpO2 99 %.  Intake/Output Summary (Last 24 hours) at 09/26/2020 1413 Last data filed at 09/26/2020 0900 Gross per 24 hour  Intake 4155.72 ml  Output --  Net 4155.72 ml    Filed Weights   09/22/20 1857 09/23/20 1508  Weight: 85.3 kg 87.3 kg    Examination: General: No acute respiratory distress Lungs: CTA B without wheezing Cardiovascular: RRR without murmur or rub Abdomen: NT/ND, soft, BS positive.  No rebound Extremities: No signif edema bilateral lower extremities  CBC: Recent Labs  Lab 09/22/20 2107 09/22/20 2126 09/23/20 0357 09/25/20 0718 09/26/20 0740  WBC 9.5  --  7.5 6.8 6.6  NEUTROABS 6.3  --   --  3.7  --   HGB 21.6*   < > 19.4* 15.4 15.3  HCT 57.2*   < > RESULTS UNAVAILABLE DUE TO INTERFERING SUBSTANCE 42.6 42.0  MCV 93.6  --  RESULTS  UNAVAILABLE DUE TO INTERFERING SUBSTANCE 97.3 97.4  PLT 260  --  196 161 166   < > = values in this interval not displayed.    Basic Metabolic Panel: Recent Labs  Lab 09/23/20 0357 09/25/20 0718 09/26/20 0740  NA 124* 127* 128*  K 3.0* 3.5 3.6  CL 95* 105 109  CO2 16* 17* 16*  GLUCOSE 105* 100* 99  BUN 31* 9 6  CREATININE 1.19 0.71 0.54*  CALCIUM 7.8* 7.6* 7.5*  MG 1.7 1.3* 1.7  PHOS 2.3* 1.0* 1.6*    GFR: Estimated Creatinine Clearance: 121.4 mL/min (A) (by C-G formula based on SCr of 0.54 mg/dL (L)).  Liver Function Tests: Recent Labs  Lab 09/22/20 2107 09/23/20 0357 09/25/20 0718 09/26/20 0740  AST 67* 49* 39 30  ALT 80* 61* 48* 38  ALKPHOS 201* 150* 135* 132*  BILITOT 2.3* 1.7* 1.0 0.9  PROT 8.3* 6.6 5.6* 5.3*  ALBUMIN 4.6 3.7 3.1* 2.9*    Recent Labs  Lab 09/22/20 1918 09/26/20 0740  LIPASE 135* 199*    Coagulation Profile: Recent Labs  Lab 09/23/20 0357  INR 1.3*     HgbA1C: Hgb A1c MFr Bld  Date/Time Value Ref Range Status  04/05/2020 12:13 PM 5.7 (H) <5.7 % of total Hgb Final    Comment:    For someone without known diabetes, a hemoglobin  A1c value between 5.7% and 6.4% is consistent with prediabetes and should be confirmed with a  follow-up test. . For someone with known diabetes, a value <7% indicates that their diabetes is well controlled. A1c targets should be individualized based on duration of diabetes, age, comorbid conditions, and other considerations. . This assay result is consistent with an increased risk of diabetes. . Currently, no consensus exists regarding use of hemoglobin A1c for diagnosis of diabetes for children. Marland Kitchen   10/26/2019 04:24 PM 4.9 <5.7 % of total Hgb Final    Comment:    For the purpose of screening for the presence of diabetes: . <5.7%       Consistent with the absence of diabetes 5.7-6.4%    Consistent with increased risk for diabetes             (prediabetes) > or =6.5%  Consistent with  diabetes . This assay result is consistent with a decreased risk of diabetes. . Currently, no consensus exists regarding use of hemoglobin A1c for diagnosis of diabetes in children. . According to American Diabetes Association (ADA) guidelines, hemoglobin A1c <7.0% represents optimal control in non-pregnant diabetic patients. Different metrics may apply to specific patient populations.  Standards of Medical Care in Diabetes(ADA). Marland Kitchen      Recent Results (from the past 240 hour(s))  Gastrointestinal Panel by PCR , Stool     Status: Abnormal   Collection Time: 09/16/20  6:19 PM   Specimen: Stool  Result Value Ref Range Status   Campylobacter species NOT DETECTED NOT DETECTED Final   Plesimonas shigelloides DETECTED (A) NOT DETECTED Final    Comment: RESULT CALLED TO, READ BACK BY AND VERIFIED WITH: NEEDHAM,MARY CN AT 1500 ON 09/17/2020 BY MOSLEY,J    Salmonella species DETECTED (A) NOT DETECTED Final  Comment: RESULT CALLED TO, READ BACK BY AND VERIFIED WITH: NEEDHAM,MARY CN AT 1500 ON 09/17/2020 BY MOSLEY,J    Yersinia enterocolitica NOT DETECTED NOT DETECTED Final   Vibrio species NOT DETECTED NOT DETECTED Final   Vibrio cholerae NOT DETECTED NOT DETECTED Final   Enteroaggregative E coli (EAEC) NOT DETECTED NOT DETECTED Final   Enteropathogenic E coli (EPEC) NOT DETECTED NOT DETECTED Final   Enterotoxigenic E coli (ETEC) NOT DETECTED NOT DETECTED Final   Shiga like toxin producing E coli (STEC) NOT DETECTED NOT DETECTED Final   Shigella/Enteroinvasive E coli (EIEC) NOT DETECTED NOT DETECTED Final   Cryptosporidium DETECTED (A) NOT DETECTED Final    Comment: RESULT CALLED TO, READ BACK BY AND VERIFIED WITH: NEEDHAM,MARY CN AT 1500 ON 09/17/2020 BY MOSLEY,J    Cyclospora cayetanensis NOT DETECTED NOT DETECTED Final   Entamoeba histolytica NOT DETECTED NOT DETECTED Final   Giardia lamblia NOT DETECTED NOT DETECTED Final   Adenovirus F40/41 NOT DETECTED NOT DETECTED Final    Astrovirus NOT DETECTED NOT DETECTED Final   Norovirus GI/GII NOT DETECTED NOT DETECTED Final   Rotavirus A NOT DETECTED NOT DETECTED Final   Sapovirus (I, II, IV, and V) NOT DETECTED NOT DETECTED Final    Comment: Performed at [facility], [address] [place], [place] [address]  Resp Panel by RT-PCR (Flu A&B, Covid) Nasopharyngeal Swab     Status: None   Collection Time: 09/22/20  9:25 PM   Specimen: Nasopharyngeal Swab; Nasopharyngeal(NP) swabs in vial transport medium  Result Value Ref Range Status   SARS Coronavirus 2 by RT PCR NEGATIVE NEGATIVE Final    Comment: (NOTE) SARS-CoV-2 target nucleic acids are NOT DETECTED.  The SARS-CoV-2 RNA is generally detectable in upper respiratory specimens during the acute phase of infection. The lowest concentration of SARS-CoV-2 viral copies this assay can detect is 138 copies/mL. A negative result does not preclude SARS-Cov-2 infection and should not be used as the sole basis for treatment or other patient management decisions. A negative result may occur with  improper specimen collection/handling, submission of specimen other than nasopharyngeal swab, presence of viral mutation(s) within the areas targeted by this assay, and inadequate number of viral copies(<138 copies/mL). A negative result must be combined with clinical observations, patient history, and epidemiological information. The expected result is Negative.  Fact Sheet for Patients:  EntrepreneurPulse.com.au  Fact Sheet for Healthcare Providers:  IncredibleEmployment.be  This test is no t yet approved or cleared by the [place] FDA and  has been authorized for detection and/or diagnosis of SARS-CoV-2 by FDA under an Emergency Use Authorization (EUA). This EUA will remain  in effect (meaning this test can be used) for the duration of the COVID-19 declaration under Section 564(b)(1) of the Act, 21 U.S.C.section  360bbb-3(b)(1), unless the authorization is terminated  or revoked sooner.       Influenza A by PCR NEGATIVE NEGATIVE Final   Influenza B by PCR NEGATIVE NEGATIVE Final    Comment: (NOTE) The Xpert Xpress SARS-CoV-2/FLU/RSV plus assay is intended as an aid in the diagnosis of influenza from Nasopharyngeal swab specimens and should not be used as a sole basis for treatment. Nasal washings and aspirates are unacceptable for Xpert Xpress SARS-CoV-2/FLU/RSV testing.  Fact Sheet for Patients: EntrepreneurPulse.com.au  Fact Sheet for Healthcare Providers: IncredibleEmployment.be  This test is not yet approved or cleared by the [place] FDA and has been authorized for detection and/or diagnosis of SARS-CoV-2 by FDA under an Emergency Use Authorization (  EUA). This EUA will remain in effect (meaning this test can be used) for the duration of the COVID-19 declaration under Section 564(b)(1) of the Act, 21 U.S.C. section 360bbb-3(b)(1), unless the authorization is terminated or revoked.  Performed at [facility], [address], [place], [place] [address]   Gastrointestinal Panel by PCR , Stool     Status: Abnormal   Collection Time: 09/23/20 12:33 AM   Specimen: Stool  Result Value Ref Range Status   Campylobacter species NOT DETECTED NOT DETECTED Final   Plesimonas shigelloides NOT DETECTED NOT DETECTED Final   Salmonella species NOT DETECTED NOT DETECTED Final   Yersinia enterocolitica NOT DETECTED NOT DETECTED Final   Vibrio species NOT DETECTED NOT DETECTED Final   Vibrio cholerae NOT DETECTED NOT DETECTED Final   Enteroaggregative E coli (EAEC) NOT DETECTED NOT DETECTED Final   Enteropathogenic E coli (EPEC) NOT DETECTED NOT DETECTED Final   Enterotoxigenic E coli (ETEC) NOT DETECTED NOT DETECTED Final   Shiga like toxin producing E coli (STEC) NOT DETECTED NOT DETECTED Final   Shigella/Enteroinvasive E coli (EIEC) NOT DETECTED NOT  DETECTED Final   Cryptosporidium DETECTED (A) NOT DETECTED Final   Cyclospora cayetanensis NOT DETECTED NOT DETECTED Final   Entamoeba histolytica NOT DETECTED NOT DETECTED Final   Giardia lamblia NOT DETECTED NOT DETECTED Final   Adenovirus F40/41 NOT DETECTED NOT DETECTED Final   Astrovirus NOT DETECTED NOT DETECTED Final   Norovirus GI/GII NOT DETECTED NOT DETECTED Final   Rotavirus A NOT DETECTED NOT DETECTED Final   Sapovirus (I, II, IV, and V) NOT DETECTED NOT DETECTED Final    Comment: Performed at [facility], [address] [place], [place] [address]  C Difficile Quick Screen w PCR reflex     Status: None   Collection Time: 09/23/20 12:33 AM   Specimen: Stool  Result Value Ref Range Status   C Diff antigen NEGATIVE NEGATIVE Final   C Diff toxin NEGATIVE NEGATIVE Final   C Diff interpretation No C. difficile detected.  Final    Comment: Performed at [facility], [address] [place],  [address]     Scheduled Meds:  cholestyramine  4 g Oral 3 times per day   feeding supplement  1 Container Oral TID BM   loperamide  2 mg Oral QID   phosphorus  500 mg Oral TID   Continuous Infusions:  0.9 % NaCl with KCl 40 mEq / L 100 mL/hr at 09/26/20 0710     LOS: 4 days   Cherene Altes, MD [facility] Office  [phone] Pager - Text Page per Shea Evans  If 7PM-7AM, please contact night-coverage per Amion 09/26/2020, 2:13 PM

## 2020-09-26 NOTE — Plan of Care (Signed)
  Problem: Education: Goal: Knowledge of General Education information will improve Description Including pain rating scale, medication(s)/side effects and non-pharmacologic comfort measures Outcome: Progressing   Problem: Health Behavior/Discharge Planning: Goal: Ability to manage health-related needs will improve Outcome: Progressing   

## 2020-09-27 ENCOUNTER — Inpatient Hospital Stay (HOSPITAL_COMMUNITY): Payer: 59

## 2020-09-27 LAB — BASIC METABOLIC PANEL
Anion gap: 5 (ref 5–15)
BUN: 6 mg/dL (ref 6–20)
CO2: 17 mmol/L — ABNORMAL LOW (ref 22–32)
Calcium: 7.9 mg/dL — ABNORMAL LOW (ref 8.9–10.3)
Chloride: 108 mmol/L (ref 98–111)
Creatinine, Ser: 0.57 mg/dL — ABNORMAL LOW (ref 0.61–1.24)
GFR, Estimated: >60 mL/min (ref >60)
Glucose, Bld: 114 mg/dL — ABNORMAL HIGH (ref 70–99)
Potassium: 3.1 mmol/L — ABNORMAL LOW (ref 3.5–5.1)
Sodium: 130 mmol/L — ABNORMAL LOW (ref 135–145)

## 2020-09-27 LAB — MAGNESIUM: Magnesium: 1.7 mg/dL (ref 1.7–2.4)

## 2020-09-27 LAB — PHOSPHORUS: Phosphorus: 2.2 mg/dL — ABNORMAL LOW (ref 2.5–4.6)

## 2020-09-27 MED ORDER — CHOLESTYRAMINE 4 G PO PACK
4.0000 g | PACK | Freq: Four times a day (QID) | ORAL | Status: DC
  Administered 2020-09-27 – 2020-09-30 (×12): 4 g via ORAL
  Filled 2020-09-27 (×24): qty 1

## 2020-09-27 MED ORDER — MORPHINE SULFATE (PF) 2 MG/ML IV SOLN: 2.0000 mg | INTRAVENOUS | Status: DC | PRN

## 2020-09-27 MED ORDER — MAGNESIUM SULFATE 2 GM/50ML IV SOLN
2.0000 g | Freq: Once | INTRAVENOUS | Status: AC
  Administered 2020-09-27: 2 g via INTRAVENOUS
  Filled 2020-09-27: qty 50

## 2020-09-27 MED ORDER — POTASSIUM CHLORIDE CRYS ER 20 MEQ PO TBCR
40.0000 meq | EXTENDED_RELEASE_TABLET | Freq: Once | ORAL | Status: AC
  Administered 2020-09-27: 40 meq via ORAL
  Filled 2020-09-27: qty 2

## 2020-09-27 MED ORDER — POTASSIUM CHLORIDE CRYS ER 20 MEQ PO TBCR
40.0000 meq | EXTENDED_RELEASE_TABLET | Freq: Two times a day (BID) | ORAL | Status: AC
  Administered 2020-09-27 – 2020-09-29 (×6): 40 meq via ORAL
  Filled 2020-09-27 (×6): qty 2

## 2020-09-27 MED ORDER — OXYCODONE HCL 5 MG PO TABS
5.0000 mg | ORAL_TABLET | ORAL | Status: DC | PRN
  Administered 2020-09-27 – 2020-09-28 (×4): 5 mg via ORAL
  Filled 2020-09-27 (×4): qty 1

## 2020-09-27 NOTE — Progress Notes (Signed)
Christopher Carrillo  KGM:[id number] DOB: Dec 11, 1970 DOA: 09/22/2020 PCP: Susy Frizzle, MD    Brief Narrative:  50 year old with a history of alcoholic liver cirrhosis who presented to the hospital with nausea, watery diarrhea, and abdominal pain for 9 days.  He returned from [place] 09/11/2020 after a 2-week visit.  He stated his symptoms began 09/14/2020.  He was seen at [facility] 6/10 and a GI pathogen panel revealed Plesimonas shigelloides, Salmonella species, and Cryptosporidium.  He was treated with Bactrim and azithromycin without improvement.  He contacted his PCP 09/19/2020 and was changed to Cipro and nitazoxanide.  Despite this change he reported no significant clinical improvement.  Upon his final presentation to the [facility] ED he was found to be hyponatremic, hypokalemic, acidotic, hypokalemic, and in acute kidney injury.  Significant Events:  6/5 returned after 2-week visit to [place] 6/8 symptom onset 6/10 positive GI pathogen [place] - abx initiated  6/13 abx changed by PCP 6/16 admit via [facility] ED   Consultants:  None  Code Status: FULL CODE  Antimicrobials:  Cipro 6/17 > 6/19  DVT prophylaxis: SCDs  Subjective: Unfortunately the patient has suffered a recurrence of his profuse watery diarrhea over the last 12 hours.  He reports 5-6 episodes this afternoon of "slimy like mucus and also watery diarrhea" accompanied by diffuse abdominal pain.  He denies shortness of breath or chest pain.  Assessment & Plan:  Acute infectious gastroenteritis Repeat GI panel noted Cryptosporidium only - previously noted Salmonella and Plesimonas likely eradicated with outpatient antibiotic -in immunocompetent host specific treatment of Cryptosporidium not typically indicated -stopped Cipro - was improving with adjustment in antidiarrheal treatment -plan was to discharge home today but with recurrence this would be unwise -check KUB for obvious complications -may  require CT abdomen for further eval if persists -increase Questran dosing as this appeared to be helping -transition to oral pain medication only - ?if SIBO contributing - if does not improve over next 24hrs consider GI eval for SIBO v/s pancreatic insufficiency   Elevated lipase Patient does not have classic symptoms of acute pancreatitis but given his history of prolonged alcohol abuse and his ongoing symptoms pancreatic insufficiency/chronic pancreatitis could conceivably be a factor  - if no obvious findings on KUB consider CT abdomen to better evaluate pancreatic parenchyma - if sx not improved in 24hrs consider GI eval to consider appropriateness of pancrease tx   Alcoholic liver cirrhosis Appears reasonably well compensated at present - lactulose on hold for obvious reasons  Hyponatremia Felt to be due to significant hypovolemia as well as cirrhosis -has stabilized  Acute kidney injury Appears to have been primarily prerenal - resolved w/ volume expansion   Hypokalemia Recurring due to ongoing GI losses -continue to supplement and follow  Hypophosphatemia Improved with supplementation -increase supplement dose and follow  Hypomagnesemia Due to Gi losses -supplement further to goal of 2.0  Non anion gap metabolic acidosis Stable  Polycythemia Due to severe dehydration/hemoconcentration - resolved   Family Communication:  Status is: Inpatient  Remains inpatient appropriate because:Inpatient level of care appropriate due to severity of illness  Dispo: The patient is from: Home              Anticipated d/c is to: Home              Patient currently is not medically stable to d/c.   Difficult to place patient No   Objective: Blood pressure 112/75, pulse 87, temperature  98.7 F (37.1 C), temperature source Oral, resp. rate 18, height 5' 8"  (1.727 m), weight 87.3 kg, SpO2 98 %.  Intake/Output Summary (Last 24 hours) at 09/27/2020 1330 Last data filed at 09/26/2020  1700 Gross per 24 hour  Intake 480 ml  Output --  Net 480 ml    Filed Weights   09/22/20 1857 09/23/20 1508  Weight: 85.3 kg 87.3 kg    Examination: General: No acute respiratory distress Lungs: CTA B -no wheezing Cardiovascular: RRR without murmur or rub Abdomen: Mildly protuberant, soft, no rebound, no mass, bowel sounds hyperactive Extremities: No signif edema B LE   CBC: Recent Labs  Lab 09/22/20 2107 09/22/20 2126 09/23/20 0357 09/25/20 0718 09/26/20 0740  WBC 9.5  --  7.5 6.8 6.6  NEUTROABS 6.3  --   --  3.7  --   HGB 21.6*   < > 19.4* 15.4 15.3  HCT 57.2*   < > RESULTS UNAVAILABLE DUE TO INTERFERING SUBSTANCE 42.6 42.0  MCV 93.6  --  RESULTS UNAVAILABLE DUE TO INTERFERING SUBSTANCE 97.3 97.4  PLT 260  --  196 161 166   < > = values in this interval not displayed.    Basic Metabolic Panel: Recent Labs  Lab 09/25/20 0718 09/26/20 0740 09/27/20 0611  NA 127* 128* 130*  K 3.5 3.6 3.1*  CL 105 109 108  CO2 17* 16* 17*  GLUCOSE 100* 99 114*  BUN 9 6 6   CREATININE 0.71 0.54* 0.57*  CALCIUM 7.6* 7.5* 7.9*  MG 1.3* 1.7 1.7  PHOS 1.0* 1.6* 2.2*    GFR: Estimated Creatinine Clearance: 121.4 mL/min (A) (by C-G formula based on SCr of 0.57 mg/dL (L)).  Liver Function Tests: Recent Labs  Lab 09/22/20 2107 09/23/20 0357 09/25/20 0718 09/26/20 0740  AST 67* 49* 39 30  ALT 80* 61* 48* 38  ALKPHOS 201* 150* 135* 132*  BILITOT 2.3* 1.7* 1.0 0.9  PROT 8.3* 6.6 5.6* 5.3*  ALBUMIN 4.6 3.7 3.1* 2.9*    Recent Labs  Lab 09/22/20 1918 09/26/20 0740  LIPASE 135* 199*    Coagulation Profile: Recent Labs  Lab 09/23/20 0357  INR 1.3*     HgbA1C: Hgb A1c MFr Bld  Date/Time Value Ref Range Status  04/05/2020 12:13 PM 5.7 (H) <5.7 % of total Hgb Final    Comment:    For someone without known diabetes, a hemoglobin  A1c value between 5.7% and 6.4% is consistent with prediabetes and should be confirmed with a  follow-up test. . For someone with  known diabetes, a value <7% indicates that their diabetes is well controlled. A1c targets should be individualized based on duration of diabetes, age, comorbid conditions, and other considerations. . This assay result is consistent with an increased risk of diabetes. . Currently, no consensus exists regarding use of hemoglobin A1c for diagnosis of diabetes for children. Marland Kitchen   10/26/2019 04:24 PM 4.9 <5.7 % of total Hgb Final    Comment:    For the purpose of screening for the presence of diabetes: . <5.7%       Consistent with the absence of diabetes 5.7-6.4%    Consistent with increased risk for diabetes             (prediabetes) > or =6.5%  Consistent with diabetes . This assay result is consistent with a decreased risk of diabetes. . Currently, no consensus exists regarding use of hemoglobin A1c for diagnosis of diabetes in children. . According to American Diabetes  Association (Christopher) guidelines, hemoglobin A1c <7.0% represents optimal control in non-pregnant diabetic patients. Different metrics may apply to specific patient populations.  Standards of Medical Care in Diabetes(Christopher). Marland Kitchen      Recent Results (from the past 240 hour(s))  Resp Panel by RT-PCR (Flu A&B, Covid) Nasopharyngeal Swab     Status: None   Collection Time: 09/22/20  9:25 PM   Specimen: Nasopharyngeal Swab; Nasopharyngeal(NP) swabs in vial transport medium  Result Value Ref Range Status   SARS Coronavirus 2 by RT PCR NEGATIVE NEGATIVE Final    Comment: (NOTE) SARS-CoV-2 target nucleic acids are NOT DETECTED.  The SARS-CoV-2 RNA is generally detectable in upper respiratory specimens during the acute phase of infection. The lowest concentration of SARS-CoV-2 viral copies this assay can detect is 138 copies/mL. A negative result does not preclude SARS-Cov-2 infection and should not be used as the sole basis for treatment or other patient management decisions. A negative result may occur with   improper specimen collection/handling, submission of specimen other than nasopharyngeal swab, presence of viral mutation(s) within the areas targeted by this assay, and inadequate number of viral copies(<138 copies/mL). A negative result must be combined with clinical observations, patient history, and epidemiological information. The expected result is Negative.  Fact Sheet for Patients:  EntrepreneurPulse.com.au  Fact Sheet for Healthcare Providers:  IncredibleEmployment.be  This test is no t yet approved or cleared by the [place] FDA and  has been authorized for detection and/or diagnosis of SARS-CoV-2 by FDA under an Emergency Use Authorization (EUA). This EUA will remain  in effect (meaning this test can be used) for the duration of the COVID-19 declaration under Section 564(b)(1) of the Act, 21 U.S.C.section 360bbb-3(b)(1), unless the authorization is terminated  or revoked sooner.       Influenza A by PCR NEGATIVE NEGATIVE Final   Influenza B by PCR NEGATIVE NEGATIVE Final    Comment: (NOTE) The Xpert Xpress SARS-CoV-2/FLU/RSV plus assay is intended as an aid in the diagnosis of influenza from Nasopharyngeal swab specimens and should not be used as a sole basis for treatment. Nasal washings and aspirates are unacceptable for Xpert Xpress SARS-CoV-2/FLU/RSV testing.  Fact Sheet for Patients: EntrepreneurPulse.com.au  Fact Sheet for Healthcare Providers: IncredibleEmployment.be  This test is not yet approved or cleared by the [place] FDA and has been authorized for detection and/or diagnosis of SARS-CoV-2 by FDA under an Emergency Use Authorization (EUA). This EUA will remain in effect (meaning this test can be used) for the duration of the COVID-19 declaration under Section 564(b)(1) of the Act, 21 U.S.C. section 360bbb-3(b)(1), unless the authorization is terminated  or revoked.  Performed at [facility], [address], [place], [place] [address]   Gastrointestinal Panel by PCR , Stool     Status: Abnormal   Collection Time: 09/23/20 12:33 AM   Specimen: Stool  Result Value Ref Range Status   Campylobacter species NOT DETECTED NOT DETECTED Final   Plesimonas shigelloides NOT DETECTED NOT DETECTED Final   Salmonella species NOT DETECTED NOT DETECTED Final   Yersinia enterocolitica NOT DETECTED NOT DETECTED Final   Vibrio species NOT DETECTED NOT DETECTED Final   Vibrio cholerae NOT DETECTED NOT DETECTED Final   Enteroaggregative E coli (EAEC) NOT DETECTED NOT DETECTED Final   Enteropathogenic E coli (EPEC) NOT DETECTED NOT DETECTED Final   Enterotoxigenic E coli (ETEC) NOT DETECTED NOT DETECTED Final   Shiga like toxin producing E coli (STEC) NOT DETECTED NOT DETECTED Final   Shigella/Enteroinvasive  E coli (EIEC) NOT DETECTED NOT DETECTED Final   Cryptosporidium DETECTED (A) NOT DETECTED Final   Cyclospora cayetanensis NOT DETECTED NOT DETECTED Final   Entamoeba histolytica NOT DETECTED NOT DETECTED Final   Giardia lamblia NOT DETECTED NOT DETECTED Final   Adenovirus F40/41 NOT DETECTED NOT DETECTED Final   Astrovirus NOT DETECTED NOT DETECTED Final   Norovirus GI/GII NOT DETECTED NOT DETECTED Final   Rotavirus A NOT DETECTED NOT DETECTED Final   Sapovirus (I, II, IV, and V) NOT DETECTED NOT DETECTED Final    Comment: Performed at [facility], [address] [place], [place] [address]  C Difficile Quick Screen w PCR reflex     Status: None   Collection Time: 09/23/20 12:33 AM   Specimen: Stool  Result Value Ref Range Status   C Diff antigen NEGATIVE NEGATIVE Final   C Diff toxin NEGATIVE NEGATIVE Final   C Diff interpretation No C. difficile detected.  Final    Comment: Performed at [facility], [address], [place], [place] [address]     Scheduled Meds:  cholestyramine  4 g Oral 3 times per day   feeding supplement  1  Container Oral TID BM   loperamide  2 mg Oral QID   phosphorus  500 mg Oral TID     LOS: 5 days   Cherene Altes, MD [facility] Office  [phone] Pager - Text Page per Shea Evans  If 7PM-7AM, please contact night-coverage per Amion 09/27/2020, 1:30 PM

## 2020-09-28 ENCOUNTER — Inpatient Hospital Stay (HOSPITAL_COMMUNITY): Payer: 59

## 2020-09-28 DIAGNOSIS — I10 Essential (primary) hypertension: Secondary | ICD-10-CM

## 2020-09-28 DIAGNOSIS — E663 Overweight: Secondary | ICD-10-CM | POA: Diagnosis present

## 2020-09-28 LAB — COMPREHENSIVE METABOLIC PANEL
ALT: 36 U/L (ref 0–44)
AST: 29 U/L (ref 15–41)
Albumin: 2.7 g/dL — ABNORMAL LOW (ref 3.5–5.0)
Alkaline Phosphatase: 141 U/L — ABNORMAL HIGH (ref 38–126)
Anion gap: 4 — ABNORMAL LOW (ref 5–15)
BUN: 5 mg/dL — ABNORMAL LOW (ref 6–20)
CO2: 20 mmol/L — ABNORMAL LOW (ref 22–32)
Calcium: 8.1 mg/dL — ABNORMAL LOW (ref 8.9–10.3)
Chloride: 108 mmol/L (ref 98–111)
Creatinine, Ser: 0.53 mg/dL — ABNORMAL LOW (ref 0.61–1.24)
GFR, Estimated: >60 mL/min (ref >60)
Glucose, Bld: 85 mg/dL (ref 70–99)
Potassium: 4 mmol/L (ref 3.5–5.1)
Sodium: 132 mmol/L — ABNORMAL LOW (ref 135–145)
Total Bilirubin: 0.9 mg/dL (ref 0.3–1.2)
Total Protein: 5.2 g/dL — ABNORMAL LOW (ref 6.5–8.1)

## 2020-09-28 LAB — CBC
HCT: 41.9 % (ref 39.0–52.0)
Hemoglobin: 15 g/dL (ref 13.0–17.0)
MCH: 35.4 pg — ABNORMAL HIGH (ref 26.0–34.0)
MCHC: 35.8 g/dL (ref 30.0–36.0)
MCV: 98.8 fL (ref 80.0–100.0)
Platelets: 185 10*3/uL (ref 150–400)
RBC: 4.24 MIL/uL (ref 4.22–5.81)
RDW: 14.3 % (ref 11.5–15.5)
WBC: 8.2 10*3/uL (ref 4.0–10.5)
nRBC: 0 % (ref 0.0–0.2)

## 2020-09-28 LAB — MAGNESIUM: Magnesium: 1.8 mg/dL (ref 1.7–2.4)

## 2020-09-28 LAB — PHOSPHORUS: Phosphorus: 2.7 mg/dL (ref 2.5–4.6)

## 2020-09-28 MED ORDER — IOHEXOL 9 MG/ML PO SOLN
ORAL | Status: AC
  Administered 2020-09-28: 500 mL
  Filled 2020-09-28: qty 1000

## 2020-09-28 MED ORDER — IOHEXOL 300 MG/ML  SOLN
100.0000 mL | Freq: Once | INTRAMUSCULAR | Status: AC | PRN
  Administered 2020-09-28: 100 mL via INTRAVENOUS

## 2020-09-28 MED ORDER — OXYCODONE HCL 5 MG PO TABS
5.0000 mg | ORAL_TABLET | ORAL | Status: DC | PRN
  Administered 2020-09-28 – 2020-09-30 (×12): 5 mg via ORAL
  Filled 2020-09-28 (×12): qty 1

## 2020-09-28 NOTE — Progress Notes (Signed)
PROGRESS NOTE  Christopher Carrillo PYP:[id number] DOB: 20-Apr-1970 DOA: 09/22/2020 PCP: Susy Frizzle, MD  HPI/Recap of past 61 hours: 50 year old with past medical history of alcoholic liver cirrhosis, hypertension and being overweight presented to the emergency room on 6/16 with nausea and watery diarrhea and abdominal pain x9 days.  Patient had returned from a trip to [place] on 6/5 after being there for 2 weeks and start developing symptoms on 6/8.  Seen at [facility] on 6/10 and at that time GI pathogen panel revealed plus minus Shiga [place], Salmonella and Cryptosporidium.  Patient treated with Bactrim and Zithromax without improvement and PCP started Cipro and nitazoxanide on 6/13, but still no improvement.  In the emergency room, noted to be hyponatremic, hypokalemic and with acute kidney injury.  Repeat GI panel noted Cryptosporidium only.  Patient started on antidiarrheals and has had some improvement.  He states he feels a bit more backed up so when he goes, is with more force, but reports about 6 bowel movements versus 25 daily.  Abdominal x-ray unrevealing.  Tolerating some p.o.  Assessment/Plan: Principal Problem:   Acute diarrhea from acute infectious gastritis/Cryptosporidium infection: Have stopped Cipro.  Continue Questran.  Tolerating oral pain medication.  Will check abdominal CT. Active Problems:   Essential hypertension: Blood pressure stable.    Alcoholic cirrhosis ([place]): Stable.  Lactulose on hold given previous symptoms.    Hyponatremia/hypokalemia: Potassium replaced.  Likely secondary to cirrhosis plus hypovolemia.  Has since steadily improved.    AKI (acute kidney injury) ([place]): Secondary dehydration.  Improved with IV fluids.    Overweight (BMI 25.0-29.9): Patient meets criteria BMI greater than 25.  Elevated lipase: No signs of pancreatitis.  May be more related to previous throwing up.  Code Status: Full code  Family Communication: Declined for me  to call wife.  Patient states that she follows notes on my chart  Disposition Plan: Home, potentially tomorrow following CT   Consultants: None, will discuss case with gastroenterology  Procedures: None  Antimicrobials: Cipro 6/17-6/19  DVT prophylaxis: SCDs  Level of care: Med-Surg   Objective: Vitals:   09/28/20 0430 09/28/20 1405  BP: 116/68 117/69  Pulse: 79 76  Resp: 15 17  Temp: 98.1 F (36.7 C) 98 F (36.7 C)  SpO2: 98% 98%    Intake/Output Summary (Last 24 hours) at 09/28/2020 1618 Last data filed at 09/28/2020 1300 Gross per 24 hour  Intake 1440 ml  Output --  Net 1440 ml   Filed Weights   09/22/20 1857 09/23/20 1508  Weight: 85.3 kg 87.3 kg   Body mass index is 29.26 kg/m.  Exam:  General: Alert and oriented x3, no acute distress HEENT: Normocephalic and atraumatic, mucous membranes are moist Cardiovascular: Regular rate and rhythm, S1-S2 Respiratory: Clear to auscultation bilaterally Abdomen: Soft, nontender, nondistended, positive bowel sounds Musculoskeletal: No clubbing or cyanosis or edema Skin: No skin breaks, tears or lesions Psychiatry: Appropriate, no evidence of psychoses Neurology: No focal deficits   Data Reviewed: CBC: Recent Labs  Lab 09/22/20 2107 09/22/20 2126 09/23/20 0357 09/25/20 0718 09/26/20 0740 09/28/20 0417  WBC 9.5  --  7.5 6.8 6.6 8.2  NEUTROABS 6.3  --   --  3.7  --   --   HGB 21.6* 21.4* 19.4* 15.4 15.3 15.0  HCT 57.2* 63.0* RESULTS UNAVAILABLE DUE TO INTERFERING SUBSTANCE 42.6 42.0 41.9  MCV 93.6  --  RESULTS UNAVAILABLE DUE TO INTERFERING SUBSTANCE 97.3 97.4 98.8  PLT 260  --  196 161 166 845   Basic Metabolic Panel: Recent Labs  Lab 09/23/20 0357 09/25/20 0718 09/26/20 0740 09/27/20 0611 09/28/20 0417  NA 124* 127* 128* 130* 132*  K 3.0* 3.5 3.6 3.1* 4.0  CL 95* 105 109 108 108  CO2 16* 17* 16* 17* 20*  GLUCOSE 105* 100* 99 114* 85  BUN 31* 9 6 6  5*  CREATININE 1.19 0.71 0.54* 0.57* 0.53*   CALCIUM 7.8* 7.6* 7.5* 7.9* 8.1*  MG 1.7 1.3* 1.7 1.7 1.8  PHOS 2.3* 1.0* 1.6* 2.2* 2.7   GFR: Estimated Creatinine Clearance: 121.4 mL/min (A) (by C-G formula based on SCr of 0.53 mg/dL (L)). Liver Function Tests: Recent Labs  Lab 09/22/20 2107 09/23/20 0357 09/25/20 0718 09/26/20 0740 09/28/20 0417  AST 67* 49* 39 30 29  ALT 80* 61* 48* 38 36  ALKPHOS 201* 150* 135* 132* 141*  BILITOT 2.3* 1.7* 1.0 0.9 0.9  PROT 8.3* 6.6 5.6* 5.3* 5.2*  ALBUMIN 4.6 3.7 3.1* 2.9* 2.7*   Recent Labs  Lab 09/22/20 1918 09/26/20 0740  LIPASE 135* 199*   No results for input(s): AMMONIA in the last 168 hours. Coagulation Profile: Recent Labs  Lab 09/23/20 0357  INR 1.3*   Cardiac Enzymes: No results for input(s): CKTOTAL, CKMB, CKMBINDEX, TROPONINI in the last 168 hours. BNP (last 3 results) No results for input(s): PROBNP in the last 8760 hours. HbA1C: No results for input(s): HGBA1C in the last 72 hours. CBG: No results for input(s): GLUCAP in the last 168 hours. Lipid Profile: No results for input(s): CHOL, HDL, LDLCALC, TRIG, CHOLHDL, LDLDIRECT in the last 72 hours. Thyroid Function Tests: No results for input(s): TSH, T4TOTAL, FREET4, T3FREE, THYROIDAB in the last 72 hours. Anemia Panel: No results for input(s): VITAMINB12, FOLATE, FERRITIN, TIBC, IRON, RETICCTPCT in the last 72 hours. Urine analysis:    Component Value Date/Time   COLORURINE YELLOW 09/22/2020 1922   APPEARANCEUR CLEAR 09/22/2020 1922   LABSPEC >1.030 (H) 09/22/2020 1922   PHURINE 6.0 09/22/2020 [place] NEGATIVE 09/22/2020 [place] NEGATIVE 09/22/2020 1922   BILIRUBINUR MODERATE (A) 09/22/2020 [place] NEGATIVE 09/22/2020 1922   PROTEINUR 100 (A) 09/22/2020 1922   NITRITE NEGATIVE 09/22/2020 1922   LEUKOCYTESUR NEGATIVE 09/22/2020 1922   Sepsis Labs: @LABRCNTIP (procalcitonin:4,lacticidven:4)  ) Recent Results (from the past 240 hour(s))  Resp Panel by RT-PCR (Flu A&B, Covid)  Nasopharyngeal Swab     Status: None   Collection Time: 09/22/20  9:25 PM   Specimen: Nasopharyngeal Swab; Nasopharyngeal(NP) swabs in vial transport medium  Result Value Ref Range Status   SARS Coronavirus 2 by RT PCR NEGATIVE NEGATIVE Final    Comment: (NOTE) SARS-CoV-2 target nucleic acids are NOT DETECTED.  The SARS-CoV-2 RNA is generally detectable in upper respiratory specimens during the acute phase of infection. The lowest concentration of SARS-CoV-2 viral copies this assay can detect is 138 copies/mL. A negative result does not preclude SARS-Cov-2 infection and should not be used as the sole basis for treatment or other patient management decisions. A negative result may occur with  improper specimen collection/handling, submission of specimen other than nasopharyngeal swab, presence of viral mutation(s) within the areas targeted by this assay, and inadequate number of viral copies(<138 copies/mL). A negative result must be combined with clinical observations, patient history, and epidemiological information. The expected result is Negative.  Fact Sheet for Patients:  EntrepreneurPulse.com.au  Fact Sheet for Healthcare Providers:  IncredibleEmployment.be  This test is no t yet  approved or cleared by the [place] and  has been authorized for detection and/or diagnosis of SARS-CoV-2 by FDA under an Emergency Use Authorization (EUA). This EUA will remain  in effect (meaning this test can be used) for the duration of the COVID-19 declaration under Section 564(b)(1) of the Act, 21 U.S.C.section 360bbb-3(b)(1), unless the authorization is terminated  or revoked sooner.       Influenza A by PCR NEGATIVE NEGATIVE Final   Influenza B by PCR NEGATIVE NEGATIVE Final    Comment: (NOTE) The Xpert Xpress SARS-CoV-2/FLU/RSV plus assay is intended as an aid in the diagnosis of influenza from Nasopharyngeal swab specimens and should not be  used as a sole basis for treatment. Nasal washings and aspirates are unacceptable for Xpert Xpress SARS-CoV-2/FLU/RSV testing.  Fact Sheet for Patients: EntrepreneurPulse.com.au  Fact Sheet for Healthcare Providers: IncredibleEmployment.be  This test is not yet approved or cleared by the [place] FDA and has been authorized for detection and/or diagnosis of SARS-CoV-2 by FDA under an Emergency Use Authorization (EUA). This EUA will remain in effect (meaning this test can be used) for the duration of the COVID-19 declaration under Section 564(b)(1) of the Act, 21 U.S.C. section 360bbb-3(b)(1), unless the authorization is terminated or revoked.  Performed at [facility], [address], [place], [place] [address]   Gastrointestinal Panel by PCR , Stool     Status: Abnormal   Collection Time: 09/23/20 12:33 AM   Specimen: Stool  Result Value Ref Range Status   Campylobacter species NOT DETECTED NOT DETECTED Final   Plesimonas shigelloides NOT DETECTED NOT DETECTED Final   Salmonella species NOT DETECTED NOT DETECTED Final   Yersinia enterocolitica NOT DETECTED NOT DETECTED Final   Vibrio species NOT DETECTED NOT DETECTED Final   Vibrio cholerae NOT DETECTED NOT DETECTED Final   Enteroaggregative E coli (EAEC) NOT DETECTED NOT DETECTED Final   Enteropathogenic E coli (EPEC) NOT DETECTED NOT DETECTED Final   Enterotoxigenic E coli (ETEC) NOT DETECTED NOT DETECTED Final   Shiga like toxin producing E coli (STEC) NOT DETECTED NOT DETECTED Final   Shigella/Enteroinvasive E coli (EIEC) NOT DETECTED NOT DETECTED Final   Cryptosporidium DETECTED (A) NOT DETECTED Final   Cyclospora cayetanensis NOT DETECTED NOT DETECTED Final   Entamoeba histolytica NOT DETECTED NOT DETECTED Final   Giardia lamblia NOT DETECTED NOT DETECTED Final   Adenovirus F40/41 NOT DETECTED NOT DETECTED Final   Astrovirus NOT DETECTED NOT DETECTED Final   Norovirus GI/GII NOT  DETECTED NOT DETECTED Final   Rotavirus A NOT DETECTED NOT DETECTED Final   Sapovirus (I, II, IV, and V) NOT DETECTED NOT DETECTED Final    Comment: Performed at [facility], [address] [place], [place] [address]  C Difficile Quick Screen w PCR reflex     Status: None   Collection Time: 09/23/20 12:33 AM   Specimen: Stool  Result Value Ref Range Status   C Diff antigen NEGATIVE NEGATIVE Final   C Diff toxin NEGATIVE NEGATIVE Final   C Diff interpretation No C. difficile detected.  Final    Comment: Performed at [facility], [address], [place], [place] [address]      Studies: No results found.  Scheduled Meds:  cholestyramine  4 g Oral QID   feeding supplement  1 Container Oral TID BM   loperamide  2 mg Oral QID   phosphorus  500 mg Oral TID   potassium chloride  40 mEq Oral BID  Continuous Infusions:   LOS: 6 days     Annita Brod, MD [facility]   09/28/2020, 4:18 PM

## 2020-09-29 DIAGNOSIS — K5732 Diverticulitis of large intestine without perforation or abscess without bleeding: Secondary | ICD-10-CM

## 2020-09-29 LAB — COMPREHENSIVE METABOLIC PANEL
ALT: 58 U/L — ABNORMAL HIGH (ref 0–44)
AST: 57 U/L — ABNORMAL HIGH (ref 15–41)
Albumin: 2.8 g/dL — ABNORMAL LOW (ref 3.5–5.0)
Alkaline Phosphatase: 227 U/L — ABNORMAL HIGH (ref 38–126)
Anion gap: 6 (ref 5–15)
BUN: 6 mg/dL (ref 6–20)
CO2: 21 mmol/L — ABNORMAL LOW (ref 22–32)
Calcium: 8.1 mg/dL — ABNORMAL LOW (ref 8.9–10.3)
Chloride: 104 mmol/L (ref 98–111)
Creatinine, Ser: 0.51 mg/dL — ABNORMAL LOW (ref 0.61–1.24)
GFR, Estimated: >60 mL/min (ref >60)
Glucose, Bld: 90 mg/dL (ref 70–99)
Potassium: 4 mmol/L (ref 3.5–5.1)
Sodium: 131 mmol/L — ABNORMAL LOW (ref 135–145)
Total Bilirubin: 1.8 mg/dL — ABNORMAL HIGH (ref 0.3–1.2)
Total Protein: 5.6 g/dL — ABNORMAL LOW (ref 6.5–8.1)

## 2020-09-29 MED ORDER — OXYCODONE HCL 5 MG PO TABS
5.0000 mg | ORAL_TABLET | Freq: Once | ORAL | Status: AC
  Administered 2020-09-29: 5 mg via ORAL
  Filled 2020-09-29: qty 1

## 2020-09-29 MED ORDER — METRONIDAZOLE 500 MG PO TABS
500.0000 mg | ORAL_TABLET | Freq: Three times a day (TID) | ORAL | Status: DC
  Administered 2020-09-29 – 2020-09-30 (×4): 500 mg via ORAL
  Filled 2020-09-29 (×4): qty 1

## 2020-09-29 NOTE — Progress Notes (Signed)
PROGRESS NOTE  Christopher Carrillo KPT:[id number] DOB: 1970/11/06 DOA: 09/22/2020 PCP: Susy Frizzle, MD  HPI/Recap of past 55 hours: 50 year old with past medical history of alcoholic liver cirrhosis, hypertension and being overweight presented to the emergency room on 6/16 with nausea and watery diarrhea and abdominal pain x9 days.  Patient had returned from a trip to [place] on 6/5 after being there for 2 weeks and start developing symptoms on 6/8.  Seen at [facility] on 6/10 and at that time GI pathogen panel revealed plus minus Shiga Lloyds, Salmonella and Cryptosporidium.  Patient treated with Bactrim and Zithromax without improvement and PCP started Cipro and nitazoxanide on 6/13, but still no improvement.  In the emergency room, noted to be hyponatremic, hypokalemic and with acute kidney injury.  Repeat GI panel noted Cryptosporidium only.  Patient started on antidiarrheals and has had some improvement.  He states he feels a bit more backed up so when he goes, is with more force, but reports about 6 bowel movements versus 25 daily.  Abdominal x-ray unrevealing, but CT of abdomen pelvis notes acute diverticulitis of the ascending colon.patient having few bowel movements but does note some significant discomfort on the right side of his abdomen.  Assessment/Plan: Principal Problem:   Acute diarrhea from acute infectious gastritis/Cryptosporidium infection: Have stopped Cipro.  Continue Questran.  Tolerating oral pain medication.  Suspect the remaining diarrhea he is having may be more related to the diverticulitis at this point.  We will stop Imodium Active Problems:   Essential hypertension: Blood pressure stable.    Alcoholic cirrhosis ([place]): Stable.  Lactulose on hold given previous symptoms.    Hyponatremia/hypokalemia: Potassium replaced.  Likely secondary to cirrhosis plus hypovolemia.  Has since steadily improved.    AKI (acute kidney injury) ([place]): Secondary dehydration.   Improved with IV fluids.    Overweight (BMI 25.0-29.9): Patient meets criteria BMI greater than 25.  Ascending colonic diverticulitis: Suspect his initial diarrhea was more related to infection but now with significant counts of bowel movement it has irritated his colon and now he has diverticulitis.  Previously received Cipro.  We will put on short course of oral Flagyl.  Elevated lipase: No signs of pancreatitis.  May be more related to previous throwing up.  Code Status: Full code  Family Communication: Declined for me to call wife.  Patient states that she follows notes on my chart  Disposition Plan: Home likely tomorrow   Consultants: None  Procedures: None  Antimicrobials: Cipro 6/17-6/19 Flagyl 6/23-present  DVT prophylaxis: SCDs  Level of care: Med-Surg   Objective: Vitals:   09/28/20 2041 09/29/20 0409  BP: 112/69 115/82  Pulse: 88 83  Resp: 18 18  Temp: 98.4 F (36.9 C) 98.5 F (36.9 C)  SpO2: 99% 98%    Intake/Output Summary (Last 24 hours) at 09/29/2020 1233 Last data filed at 09/28/2020 1807 Gross per 24 hour  Intake 720 ml  Output --  Net 720 ml    Filed Weights   09/22/20 1857 09/23/20 1508  Weight: 85.3 kg 87.3 kg   Body mass index is 29.26 kg/m.  Exam:  General: Alert and oriented x3, no acute distress HEENT: Normocephalic and atraumatic, mucous membranes are moist Cardiovascular: Regular rate and rhythm, S1-S2 Respiratory: Clear to auscultation bilaterally Abdomen: Soft, mild tenderness right side of abdomen, nondistended, positive bowel sounds Musculoskeletal: No clubbing or cyanosis or edema Skin: No skin breaks, tears or lesions Psychiatry: Appropriate, no evidence of psychoses Neurology: No focal deficits  Data Reviewed: CBC: Recent Labs  Lab 09/22/20 2107 09/22/20 2126 09/23/20 0357 09/25/20 0718 09/26/20 0740 09/28/20 0417  WBC 9.5  --  7.5 6.8 6.6 8.2  NEUTROABS 6.3  --   --  3.7  --   --   HGB 21.6* 21.4*  19.4* 15.4 15.3 15.0  HCT 57.2* 63.0* RESULTS UNAVAILABLE DUE TO INTERFERING SUBSTANCE 42.6 42.0 41.9  MCV 93.6  --  RESULTS UNAVAILABLE DUE TO INTERFERING SUBSTANCE 97.3 97.4 98.8  PLT 260  --  196 161 166 947    Basic Metabolic Panel: Recent Labs  Lab 09/23/20 0357 09/25/20 0718 09/26/20 0740 09/27/20 0611 09/28/20 0417 09/29/20 0438  NA 124* 127* 128* 130* 132* 131*  K 3.0* 3.5 3.6 3.1* 4.0 4.0  CL 95* 105 109 108 108 104  CO2 16* 17* 16* 17* 20* 21*  GLUCOSE 105* 100* 99 114* 85 90  BUN 31* 9 6 6  5* 6  CREATININE 1.19 0.71 0.54* 0.57* 0.53* 0.51*  CALCIUM 7.8* 7.6* 7.5* 7.9* 8.1* 8.1*  MG 1.7 1.3* 1.7 1.7 1.8  --   PHOS 2.3* 1.0* 1.6* 2.2* 2.7  --     GFR: Estimated Creatinine Clearance: 121.4 mL/min (A) (by C-G formula based on SCr of 0.51 mg/dL (L)). Liver Function Tests: Recent Labs  Lab 09/23/20 0357 09/25/20 0718 09/26/20 0740 09/28/20 0417 09/29/20 0438  AST 49* 39 30 29 57*  ALT 61* 48* 38 36 58*  ALKPHOS 150* 135* 132* 141* 227*  BILITOT 1.7* 1.0 0.9 0.9 1.8*  PROT 6.6 5.6* 5.3* 5.2* 5.6*  ALBUMIN 3.7 3.1* 2.9* 2.7* 2.8*    Recent Labs  Lab 09/22/20 1918 09/26/20 0740  LIPASE 135* 199*    No results for input(s): AMMONIA in the last 168 hours. Coagulation Profile: Recent Labs  Lab 09/23/20 0357  INR 1.3*    Cardiac Enzymes: No results for input(s): CKTOTAL, CKMB, CKMBINDEX, TROPONINI in the last 168 hours. BNP (last 3 results) No results for input(s): PROBNP in the last 8760 hours. HbA1C: No results for input(s): HGBA1C in the last 72 hours. CBG: No results for input(s): GLUCAP in the last 168 hours. Lipid Profile: No results for input(s): CHOL, HDL, LDLCALC, TRIG, CHOLHDL, LDLDIRECT in the last 72 hours. Thyroid Function Tests: No results for input(s): TSH, T4TOTAL, FREET4, T3FREE, THYROIDAB in the last 72 hours. Anemia Panel: No results for input(s): VITAMINB12, FOLATE, FERRITIN, TIBC, IRON, RETICCTPCT in the last 72 hours. Urine  analysis:    Component Value Date/Time   COLORURINE YELLOW 09/22/2020 1922   APPEARANCEUR CLEAR 09/22/2020 1922   LABSPEC >1.030 (H) 09/22/2020 1922   PHURINE 6.0 09/22/2020 [place] NEGATIVE 09/22/2020 [place] NEGATIVE 09/22/2020 1922   BILIRUBINUR MODERATE (A) 09/22/2020 [place] NEGATIVE 09/22/2020 1922   PROTEINUR 100 (A) 09/22/2020 1922   NITRITE NEGATIVE 09/22/2020 1922   LEUKOCYTESUR NEGATIVE 09/22/2020 1922   Sepsis Labs: @LABRCNTIP (procalcitonin:4,lacticidven:4)  ) Recent Results (from the past 240 hour(s))  Resp Panel by RT-PCR (Flu A&B, Covid) Nasopharyngeal Swab     Status: None   Collection Time: 09/22/20  9:25 PM   Specimen: Nasopharyngeal Swab; Nasopharyngeal(NP) swabs in vial transport medium  Result Value Ref Range Status   SARS Coronavirus 2 by RT PCR NEGATIVE NEGATIVE Final    Comment: (NOTE) SARS-CoV-2 target nucleic acids are NOT DETECTED.  The SARS-CoV-2 RNA is generally detectable in upper respiratory specimens during the acute phase of infection. The lowest concentration of SARS-CoV-2 viral  copies this assay can detect is 138 copies/mL. A negative result does not preclude SARS-Cov-2 infection and should not be used as the sole basis for treatment or other patient management decisions. A negative result may occur with  improper specimen collection/handling, submission of specimen other than nasopharyngeal swab, presence of viral mutation(s) within the areas targeted by this assay, and inadequate number of viral copies(<138 copies/mL). A negative result must be combined with clinical observations, patient history, and epidemiological information. The expected result is Negative.  Fact Sheet for Patients:  EntrepreneurPulse.com.au  Fact Sheet for Healthcare Providers:  IncredibleEmployment.be  This test is no t yet approved or cleared by the [place] FDA and  has been authorized for  detection and/or diagnosis of SARS-CoV-2 by FDA under an Emergency Use Authorization (EUA). This EUA will remain  in effect (meaning this test can be used) for the duration of the COVID-19 declaration under Section 564(b)(1) of the Act, 21 U.S.C.section 360bbb-3(b)(1), unless the authorization is terminated  or revoked sooner.       Influenza A by PCR NEGATIVE NEGATIVE Final   Influenza B by PCR NEGATIVE NEGATIVE Final    Comment: (NOTE) The Xpert Xpress SARS-CoV-2/FLU/RSV plus assay is intended as an aid in the diagnosis of influenza from Nasopharyngeal swab specimens and should not be used as a sole basis for treatment. Nasal washings and aspirates are unacceptable for Xpert Xpress SARS-CoV-2/FLU/RSV testing.  Fact Sheet for Patients: EntrepreneurPulse.com.au  Fact Sheet for Healthcare Providers: IncredibleEmployment.be  This test is not yet approved or cleared by the [place] FDA and has been authorized for detection and/or diagnosis of SARS-CoV-2 by FDA under an Emergency Use Authorization (EUA). This EUA will remain in effect (meaning this test can be used) for the duration of the COVID-19 declaration under Section 564(b)(1) of the Act, 21 U.S.C. section 360bbb-3(b)(1), unless the authorization is terminated or revoked.  Performed at [facility], [address] [place], [place] [address]   Gastrointestinal Panel by PCR , Stool     Status: Abnormal   Collection Time: 09/23/20 12:33 AM   Specimen: Stool  Result Value Ref Range Status   Campylobacter species NOT DETECTED NOT DETECTED Final   Plesimonas shigelloides NOT DETECTED NOT DETECTED Final   Salmonella species NOT DETECTED NOT DETECTED Final   Yersinia enterocolitica NOT DETECTED NOT DETECTED Final   Vibrio species NOT DETECTED NOT DETECTED Final   Vibrio cholerae NOT DETECTED NOT DETECTED Final   Enteroaggregative E coli (EAEC) NOT DETECTED NOT DETECTED Final    Enteropathogenic E coli (EPEC) NOT DETECTED NOT DETECTED Final   Enterotoxigenic E coli (ETEC) NOT DETECTED NOT DETECTED Final   Shiga like toxin producing E coli (STEC) NOT DETECTED NOT DETECTED Final   Shigella/Enteroinvasive E coli (EIEC) NOT DETECTED NOT DETECTED Final   Cryptosporidium DETECTED (A) NOT DETECTED Final   Cyclospora cayetanensis NOT DETECTED NOT DETECTED Final   Entamoeba histolytica NOT DETECTED NOT DETECTED Final   Giardia lamblia NOT DETECTED NOT DETECTED Final   Adenovirus F40/41 NOT DETECTED NOT DETECTED Final   Astrovirus NOT DETECTED NOT DETECTED Final   Norovirus GI/GII NOT DETECTED NOT DETECTED Final   Rotavirus A NOT DETECTED NOT DETECTED Final   Sapovirus (I, II, IV, and V) NOT DETECTED NOT DETECTED Final    Comment: Performed at [facility], [address] [place], [place] [address]  C Difficile Quick Screen w PCR reflex     Status: None   Collection Time: 09/23/20 12:33 AM  Specimen: Stool  Result Value Ref Range Status   C Diff antigen NEGATIVE NEGATIVE Final   C Diff toxin NEGATIVE NEGATIVE Final   C Diff interpretation No C. difficile detected.  Final    Comment: Performed at [facility], [address], [place], [place] [address]      Studies: CT ABDOMEN PELVIS W CONTRAST  Result Date: 09/28/2020 CLINICAL DATA:  50 year old male with abdominal pain. Concern for gastroenteritis. EXAM: CT ABDOMEN AND PELVIS WITH CONTRAST TECHNIQUE: Multidetector CT imaging of the abdomen and pelvis was performed using the standard protocol following bolus administration of intravenous contrast. CONTRAST:  166m OMNIPAQUE IOHEXOL 300 MG/ML  SOLN COMPARISON:  CT abdomen pelvis dated 08/12/2019 FINDINGS: Lower chest: Dated the visualized lung bases are clear. There is coronary vascular calcification. No intra-abdominal free air or free fluid. Hepatobiliary: Fatty infiltration of the liver. There is irregularity and lobulation of the liver contour consistent with  cirrhosis. Clinical correlation is recommended. No intrahepatic biliary dilatation. The gallbladder is unremarkable. Pancreas: Unremarkable. No pancreatic ductal dilatation or surrounding inflammatory changes. Spleen: Normal in size without focal abnormality. Adrenals/Urinary Tract: The adrenal glands are unremarkable. There is no hydronephrosis on either side. There is symmetric enhancement and excretion of contrast by both kidneys. The visualized ureters and urinary bladder appear unremarkable. Stomach/Bowel: There is diffuse colonic diverticulosis. There is inflammatory changes and thickening of the proximal ascending colon with the majority of the inflammatory process centered at a diverticula consistent with acute diverticulitis. No diverticular abscess or perforation. There is no bowel obstruction. The appendix is normal. Vascular/Lymphatic: Mild aortoiliac atherosclerotic disease. The IVC is unremarkable. No portal venous gas. There is no adenopathy. Small scattered mesenteric lymph nodes. Reproductive: The prostate and seminal vesicles are grossly unremarkable. No pelvic mass. Other: None Musculoskeletal: Mild degenerative changes of the spine. No acute osseous pathology. IMPRESSION: 1. Acute diverticulitis of the proximal ascending colon. No diverticular abscess or perforation. 2. Fatty liver with findings of cirrhosis. Clinical correlation is recommended. 3. Aortic Atherosclerosis (ICD10-I70.0). Electronically Signed   By: AAnner CreteM.D.   On: 09/28/2020 18:25    Scheduled Meds:  cholestyramine  4 g Oral QID   feeding supplement  1 Container Oral TID BM   loperamide  2 mg Oral QID   metroNIDAZOLE  500 mg Oral Q8H   phosphorus  500 mg Oral TID   potassium chloride  40 mEq Oral BID    Continuous Infusions:   LOS: 7 days     SAnnita Brod MD [facility]   09/29/2020, 12:33 PM

## 2020-09-30 DIAGNOSIS — K5720 Diverticulitis of large intestine with perforation and abscess without bleeding: Secondary | ICD-10-CM

## 2020-09-30 LAB — COMPREHENSIVE METABOLIC PANEL
ALT: 59 U/L — ABNORMAL HIGH (ref 0–44)
AST: 49 U/L — ABNORMAL HIGH (ref 15–41)
Albumin: 2.9 g/dL — ABNORMAL LOW (ref 3.5–5.0)
Alkaline Phosphatase: 267 U/L — ABNORMAL HIGH (ref 38–126)
Anion gap: 6 (ref 5–15)
BUN: 7 mg/dL (ref 6–20)
CO2: 24 mmol/L (ref 22–32)
Calcium: 8.6 mg/dL — ABNORMAL LOW (ref 8.9–10.3)
Chloride: 103 mmol/L (ref 98–111)
Creatinine, Ser: 0.59 mg/dL — ABNORMAL LOW (ref 0.61–1.24)
GFR, Estimated: >60 mL/min (ref >60)
Glucose, Bld: 111 mg/dL — ABNORMAL HIGH (ref 70–99)
Potassium: 4.1 mmol/L (ref 3.5–5.1)
Sodium: 133 mmol/L — ABNORMAL LOW (ref 135–145)
Total Bilirubin: 1.3 mg/dL — ABNORMAL HIGH (ref 0.3–1.2)
Total Protein: 5.8 g/dL — ABNORMAL LOW (ref 6.5–8.1)

## 2020-09-30 LAB — CBC
HCT: 42 % (ref 39.0–52.0)
Hemoglobin: 14.8 g/dL (ref 13.0–17.0)
MCH: 35.2 pg — ABNORMAL HIGH (ref 26.0–34.0)
MCHC: 35.2 g/dL (ref 30.0–36.0)
MCV: 100 fL (ref 80.0–100.0)
Platelets: 170 10*3/uL (ref 150–400)
RBC: 4.2 MIL/uL — ABNORMAL LOW (ref 4.22–5.81)
RDW: 14.3 % (ref 11.5–15.5)
WBC: 7.8 10*3/uL (ref 4.0–10.5)
nRBC: 0 % (ref 0.0–0.2)

## 2020-09-30 MED ORDER — METRONIDAZOLE 500 MG PO TABS: 500.0000 mg | ORAL_TABLET | Freq: Three times a day (TID) | ORAL | 0 refills | Status: AC

## 2020-09-30 MED ORDER — ONDANSETRON HCL 4 MG PO TABS: 4.0000 mg | ORAL_TABLET | Freq: Every day | ORAL | 1 refills | Status: DC | PRN

## 2020-09-30 MED ORDER — OXYCODONE HCL 5 MG PO TABS: 5.0000 mg | ORAL_TABLET | ORAL | 0 refills | Status: DC | PRN

## 2020-09-30 NOTE — Discharge Summary (Signed)
Discharge Summary  Christopher Carrillo BZJ:[id number] DOB: [birth year]  PCP: Susy Frizzle, MD  Admit date: 09/22/2020 Discharge date: 09/30/2020  Time spent: 25 minutes  Recommendations for Outpatient Follow-up:  New medication: Flagyl 25 mg every 8 hours x4 days New medication: Oxy IR 5 mg every 4 hours, 12 pills only for New medication: Zofran 4 mg every 6 hours as needed  Discharge Diagnoses:  Active Hospital Problems   Diagnosis Date Noted   Acute diarrhea 09/22/2020   Overweight (BMI 25.0-29.9) 09/28/2020   Nausea & vomiting 09/23/2020   GI bleed 09/23/2020   Hyponatremia 09/23/2020   Hypokalemia 09/23/2020   Dehydration 09/23/2020   AKI (acute kidney injury) ([place]) 01/75/1025   Alcoholic cirrhosis (HCC)    Elevated bilirubin    Elevated LFTs 07/14/2019   Abdominal pain 04/07/2013   Essential hypertension 12/19/2012    Resolved Hospital Problems  No resolved problems to display.    Discharge Condition: Improved, being discharged to home  Diet recommendation: Low sodium   Vitals:   09/30/20 0443 09/30/20 1337  BP: (!) 107/59 112/67  Pulse: 86 83  Resp: 18 18  Temp: 98.5 F (36.9 C) 98.6 F (37 C)  SpO2: 97% 98%    History of present illness:  50 year old with past medical history of alcoholic liver cirrhosis, hypertension and being overweight presented to the emergency room on 6/16 with nausea and watery diarrhea and abdominal pain x9 days.  Patient had returned from a trip to [place] on 6/5 after being there for 2 weeks and start developing symptoms on 6/8.  Seen at [facility] on 6/10 and at that time GI pathogen panel revealed plus minus [employer], Salmonella and Cryptosporidium.  Patient treated with Bactrim and Zithromax without improvement and PCP started Cipro and nitazoxanide on 6/13, but still no improvement.  In the emergency room, noted to be hyponatremic, hypokalemic and with acute kidney injury.  Hospital Course:  Principal Problem:    Acute diarrhea Active Problems:   Essential hypertension   Abdominal pain   Elevated LFTs   Elevated bilirubin   Alcoholic cirrhosis (HCC)   Nausea & vomiting   GI bleed   Hyponatremia   Hypokalemia   Dehydration   AKI (acute kidney injury) ([place])   Overweight (BMI 25.0-29.9) Principal Problem:   Acute diarrhea from acute infectious gastritis/Cryptosporidium infection: Have stopped Cipro.  Continue Questran.  Scheduled imodium has stopped diarrhea.  Tolerating oral pain medication.  Suspect the remaining diarrhea he is having may be more related to the diverticulitis at this point.  We will stop Imodium Active Problems:   Essential hypertension: Blood pressure stable.     Alcoholic cirrhosis ([place]): Stable.  Lactulose on hold given previous symptoms.Can resume on discharge.     Hyponatremia/hypokalemia: Potassium replaced.  Likely secondary to cirrhosis plus hypovolemia.  Has since steadily improved.     AKI (acute kidney injury) ([place]): Secondary dehydration.  Improved with IV fluids.     Overweight (BMI 25.0-29.9): Patient meets criteria BMI greater than 25.   Ascending colonic diverticulitis: CT of abdomen pelvis notes acute diverticulitis of the ascending colon.patient having few bowel movements but does note some significant discomfort on the right side of his abdomen..  Suspect his initial diarrhea was more related to infection but now with significant counts of bowel movement it has irritated his colon and now he has diverticulitis.  Previously received Cipro.  We will put on short course of oral Flagyl.   Elevated lipase: No signs  of pancreatitis.  May be more related to previous throwing up.  Procedures: None   Antimicrobials: Cipro 6/17-6/19 Flagyl 6/23-present  Discharge Exam: BP 112/67 (BP Location: Right Arm)   Pulse 83   Temp 98.6 F (37 C) (Oral)   Resp 18   Ht 5' 8"  (1.727 m)   Wt 89.3 kg   SpO2 98%   BMI 29.92 kg/m   General: Alert and oriented x 3   Cardiovascular: Reg rate and rhythm, S1S2  Respiratory: clear to auscultation bilaterally   Discharge Instructions You were cared for by a hospitalist during your hospital stay. If you have any questions about your discharge medications or the care you received while you were in the hospital after you are discharged, you can call the unit and asked to speak with the hospitalist on call if the hospitalist that took care of you is not available. Once you are discharged, your primary care physician will handle any further medical issues. Please note that NO REFILLS for any discharge medications will be authorized once you are discharged, as it is imperative that you return to your primary care physician (or establish a relationship with a primary care physician if you do not have one) for your aftercare needs so that they can reassess your need for medications and monitor your lab values.  Discharge Instructions     Diet - low sodium heart healthy   Complete by: As directed    Increase activity slowly   Complete by: As directed       Allergies as of 09/30/2020   No Known Allergies      Medication List     STOP taking these medications    ciprofloxacin 500 MG tablet Commonly known as: Cipro       TAKE these medications    cholestyramine 4 g packet Commonly known as: QUESTRAN Take 1 packet (4 g total) by mouth 3 (three) times daily for 7 days.   lactulose 10 GM/15ML solution Commonly known as: CHRONULAC Take 15 mLs (10 g total) by mouth 2 (two) times daily. Start taking on: October 03, 2020 What changed:  See the new instructions. These instructions start on October 03, 2020. If you are unsure what to do until then, ask your doctor or other care provider.   LORazepam 0.5 MG tablet Commonly known as: ATIVAN Take 1 tablet (0.5 mg total) by mouth 2 (two) times daily as needed for anxiety.   metroNIDAZOLE 500 MG tablet Commonly known as: FLAGYL Take 1 tablet (500 mg total) by  mouth every 8 (eight) hours for 4 days.   ondansetron 4 MG tablet Commonly known as: Zofran Take 1 tablet (4 mg total) by mouth daily as needed for nausea or vomiting.   oxyCODONE 5 MG immediate release tablet Commonly known as: Oxy IR/ROXICODONE Take 1 tablet (5 mg total) by mouth every 4 (four) hours as needed for severe pain.       No Known Allergies    The results of significant diagnostics from this hospitalization (including imaging, microbiology, ancillary and laboratory) are listed below for reference.    Significant Diagnostic Studies: CT ABDOMEN PELVIS W CONTRAST  Result Date: 09/28/2020 CLINICAL DATA:  50 year old male with abdominal pain. Concern for gastroenteritis. EXAM: CT ABDOMEN AND PELVIS WITH CONTRAST TECHNIQUE: Multidetector CT imaging of the abdomen and pelvis was performed using the standard protocol following bolus administration of intravenous contrast. CONTRAST:  1107m OMNIPAQUE IOHEXOL 300 MG/ML  SOLN COMPARISON:  CT abdomen pelvis dated  08/12/2019 FINDINGS: Lower chest: Dated the visualized lung bases are clear. There is coronary vascular calcification. No intra-abdominal free air or free fluid. Hepatobiliary: Fatty infiltration of the liver. There is irregularity and lobulation of the liver contour consistent with cirrhosis. Clinical correlation is recommended. No intrahepatic biliary dilatation. The gallbladder is unremarkable. Pancreas: Unremarkable. No pancreatic ductal dilatation or surrounding inflammatory changes. Spleen: Normal in size without focal abnormality. Adrenals/Urinary Tract: The adrenal glands are unremarkable. There is no hydronephrosis on either side. There is symmetric enhancement and excretion of contrast by both kidneys. The visualized ureters and urinary bladder appear unremarkable. Stomach/Bowel: There is diffuse colonic diverticulosis. There is inflammatory changes and thickening of the proximal ascending colon with the majority of the  inflammatory process centered at a diverticula consistent with acute diverticulitis. No diverticular abscess or perforation. There is no bowel obstruction. The appendix is normal. Vascular/Lymphatic: Mild aortoiliac atherosclerotic disease. The IVC is unremarkable. No portal venous gas. There is no adenopathy. Small scattered mesenteric lymph nodes. Reproductive: The prostate and seminal vesicles are grossly unremarkable. No pelvic mass. Other: None Musculoskeletal: Mild degenerative changes of the spine. No acute osseous pathology. IMPRESSION: 1. Acute diverticulitis of the proximal ascending colon. No diverticular abscess or perforation. 2. Fatty liver with findings of cirrhosis. Clinical correlation is recommended. 3. Aortic Atherosclerosis (ICD10-I70.0). Electronically Signed   By: Anner Crete M.D.   On: 09/28/2020 18:25   DG Abd Portable 1V  Result Date: 09/27/2020 CLINICAL DATA:  Abdominal pain.  Diarrhea. EXAM: PORTABLE ABDOMEN - 1 VIEW COMPARISON:  CT 08/12/2019. FINDINGS: Soft tissue structures are unremarkable. No bowel distention or free air. Right hemidiaphragm incompletely imaged. Degenerative change thoracic spine. No acute bony abnormality. IMPRESSION: No acute abnormality identified. Electronically Signed   By: Marcello Moores  Register   On: 09/27/2020 15:15    Microbiology: Recent Results (from the past 240 hour(s))  Resp Panel by RT-PCR (Flu A&B, Covid) Nasopharyngeal Swab     Status: None   Collection Time: 09/22/20  9:25 PM   Specimen: Nasopharyngeal Swab; Nasopharyngeal(NP) swabs in vial transport medium  Result Value Ref Range Status   SARS Coronavirus 2 by RT PCR NEGATIVE NEGATIVE Final    Comment: (NOTE) SARS-CoV-2 target nucleic acids are NOT DETECTED.  The SARS-CoV-2 RNA is generally detectable in upper respiratory specimens during the acute phase of infection. The lowest concentration of SARS-CoV-2 viral copies this assay can detect is 138 copies/mL. A negative result does  not preclude SARS-Cov-2 infection and should not be used as the sole basis for treatment or other patient management decisions. A negative result may occur with  improper specimen collection/handling, submission of specimen other than nasopharyngeal swab, presence of viral mutation(s) within the areas targeted by this assay, and inadequate number of viral copies(<138 copies/mL). A negative result must be combined with clinical observations, patient history, and epidemiological information. The expected result is Negative.  Fact Sheet for Patients:  EntrepreneurPulse.com.au  Fact Sheet for Healthcare Providers:  IncredibleEmployment.be  This test is no t yet approved or cleared by the [place] FDA and  has been authorized for detection and/or diagnosis of SARS-CoV-2 by FDA under an Emergency Use Authorization (EUA). This EUA will remain  in effect (meaning this test can be used) for the duration of the COVID-19 declaration under Section 564(b)(1) of the Act, 21 U.S.C.section 360bbb-3(b)(1), unless the authorization is terminated  or revoked sooner.       Influenza A by PCR NEGATIVE NEGATIVE Final   Influenza B by PCR  NEGATIVE NEGATIVE Final    Comment: (NOTE) The Xpert Xpress SARS-CoV-2/FLU/RSV plus assay is intended as an aid in the diagnosis of influenza from Nasopharyngeal swab specimens and should not be used as a sole basis for treatment. Nasal washings and aspirates are unacceptable for Xpert Xpress SARS-CoV-2/FLU/RSV testing.  Fact Sheet for Patients: EntrepreneurPulse.com.au  Fact Sheet for Healthcare Providers: IncredibleEmployment.be  This test is not yet approved or cleared by the [place] FDA and has been authorized for detection and/or diagnosis of SARS-CoV-2 by FDA under an Emergency Use Authorization (EUA). This EUA will remain in effect (meaning this test can be used) for the  duration of the COVID-19 declaration under Section 564(b)(1) of the Act, 21 U.S.C. section 360bbb-3(b)(1), unless the authorization is terminated or revoked.  Performed at [facility], [address], [place], [place] [address]   Gastrointestinal Panel by PCR , Stool     Status: Abnormal   Collection Time: 09/23/20 12:33 AM   Specimen: Stool  Result Value Ref Range Status   Campylobacter species NOT DETECTED NOT DETECTED Final   Plesimonas shigelloides NOT DETECTED NOT DETECTED Final   Salmonella species NOT DETECTED NOT DETECTED Final   Yersinia enterocolitica NOT DETECTED NOT DETECTED Final   Vibrio species NOT DETECTED NOT DETECTED Final   Vibrio cholerae NOT DETECTED NOT DETECTED Final   Enteroaggregative E coli (EAEC) NOT DETECTED NOT DETECTED Final   Enteropathogenic E coli (EPEC) NOT DETECTED NOT DETECTED Final   Enterotoxigenic E coli (ETEC) NOT DETECTED NOT DETECTED Final   Shiga like toxin producing E coli (STEC) NOT DETECTED NOT DETECTED Final   Shigella/Enteroinvasive E coli (EIEC) NOT DETECTED NOT DETECTED Final   Cryptosporidium DETECTED (A) NOT DETECTED Final   Cyclospora cayetanensis NOT DETECTED NOT DETECTED Final   Entamoeba histolytica NOT DETECTED NOT DETECTED Final   Giardia lamblia NOT DETECTED NOT DETECTED Final   Adenovirus F40/41 NOT DETECTED NOT DETECTED Final   Astrovirus NOT DETECTED NOT DETECTED Final   Norovirus GI/GII NOT DETECTED NOT DETECTED Final   Rotavirus A NOT DETECTED NOT DETECTED Final   Sapovirus (I, II, IV, and V) NOT DETECTED NOT DETECTED Final    Comment: Performed at [facility], [address] [place], [place] [address]  C Difficile Quick Screen w PCR reflex     Status: None   Collection Time: 09/23/20 12:33 AM   Specimen: Stool  Result Value Ref Range Status   C Diff antigen NEGATIVE NEGATIVE Final   C Diff toxin NEGATIVE NEGATIVE Final   C Diff interpretation No C. difficile detected.  Final    Comment: Performed at  [facility], [address] [place],  [address]     Labs: Basic Metabolic Panel: Recent Labs  Lab 09/25/20 0718 09/26/20 0740 09/27/20 0611 09/28/20 0417 09/29/20 0438 09/30/20 1012  NA 127* 128* 130* 132* 131* 133*  K 3.5 3.6 3.1* 4.0 4.0 4.1  CL 105 109 108 108 104 103  CO2 17* 16* 17* 20* 21* 24  GLUCOSE 100* 99 114* 85 90 111*  BUN 9 6 6  5* 6 7  CREATININE 0.71 0.54* 0.57* 0.53* 0.51* 0.59*  CALCIUM 7.6* 7.5* 7.9* 8.1* 8.1* 8.6*  MG 1.3* 1.7 1.7 1.8  --   --   PHOS 1.0* 1.6* 2.2* 2.7  --   --    Liver Function Tests: Recent Labs  Lab 09/25/20 0718 09/26/20 0740 09/28/20 0417 09/29/20 0438 09/30/20 1012  AST 39 30 29 57* 49*  ALT  48* 38 36 58* 59*  ALKPHOS 135* 132* 141* 227* 267*  BILITOT 1.0 0.9 0.9 1.8* 1.3*  PROT 5.6* 5.3* 5.2* 5.6* 5.8*  ALBUMIN 3.1* 2.9* 2.7* 2.8* 2.9*   Recent Labs  Lab 09/26/20 0740  LIPASE 199*   No results for input(s): AMMONIA in the last 168 hours. CBC: Recent Labs  Lab 09/25/20 0718 09/26/20 0740 09/28/20 0417 09/30/20 1012  WBC 6.8 6.6 8.2 7.8  NEUTROABS 3.7  --   --   --   HGB 15.4 15.3 15.0 14.8  HCT 42.6 42.0 41.9 42.0  MCV 97.3 97.4 98.8 100.0  PLT 161 166 185 170   Cardiac Enzymes: No results for input(s): CKTOTAL, CKMB, CKMBINDEX, TROPONINI in the last 168 hours. BNP: BNP (last 3 results) No results for input(s): BNP in the last 8760 hours.  ProBNP (last 3 results) No results for input(s): PROBNP in the last 8760 hours.  CBG: No results for input(s): GLUCAP in the last 168 hours.     Signed:  Annita Brod, MD [facility] 09/30/2020, 4:08 PM

## 2020-09-30 NOTE — Progress Notes (Signed)
Nutrition Follow-up  DOCUMENTATION CODES:  Not applicable  INTERVENTION:  Discontinue Boost Breeze TID.  Continue Regular diet order.  Obtain updated weight.  NUTRITION DIAGNOSIS:  Inadequate oral intake related to acute illness (gastroenteritis) as evidenced by per patient/family report. - ongoing  GOAL:  Patient will meet greater than or equal to 90% of their needs - meeting  MONITOR:  Diet advancement, PO intake, Supplement acceptance  REASON FOR ASSESSMENT:  Malnutrition Screening Tool    ASSESSMENT:  patient is a 50 yo male who presents with diarrhea since 6/8, metabolic acidosis, AKI, Hypokalemia and acute gastroenteritis. Patient history includes alcoholic liver cirrhosis.  Per Epic, pt is eating 100% of all documented meals. Per MD notes, diarrhea has been decreasing.  Admit wt: 87.3 kg Pt due for new weight today. RD to order weight.  Recommend continuing regular diet as pt is eating well and discontinuing Boost Breeze as pt has been refusing them.  Medications: reviewed; BB TID, Flagyl TID, K Phos TID, oxycodone PRN (given once today)   Labs: reviewed; Na 133, Glucose 111, serum Ca 8.6  Diet Order:   Diet Order             Diet regular Room service appropriate? Yes; Fluid consistency: Thin  Diet effective now                  EDUCATION NEEDS:  Not appropriate for education at this time  Skin:  Skin Assessment: Reviewed RN Assessment  Last BM:  09/28/20 - Type 6, medium  Height:  Ht Readings from Last 1 Encounters:  09/23/20 5' 8"  (1.727 m)   Weight:  Wt Readings from Last 1 Encounters:  09/23/20 87.3 kg   BMI:  Body mass index is 29.26 kg/m.  Estimated Nutritional Needs:  Kcal:  2175-2349 Protein:  98-113 gr Fluid:  >2.2 liters daily  Derrel Nip, RD, LDN (she/her/hers) Registered Dietitian I After-Hours/Weekend Pager # in [place]

## 2020-10-14 ENCOUNTER — Encounter: Payer: Self-pay | Admitting: Family Medicine

## 2020-10-14 ENCOUNTER — Ambulatory Visit (INDEPENDENT_AMBULATORY_CARE_PROVIDER_SITE_OTHER): Payer: 59 | Admitting: Family Medicine

## 2020-10-14 ENCOUNTER — Other Ambulatory Visit: Payer: Self-pay

## 2020-10-14 VITALS — BP 102/76 | HR 98 | Temp 98.7°F | Resp 16 | Ht 68.0 in | Wt 195.0 lb

## 2020-10-14 DIAGNOSIS — A020 Salmonella enteritis: Secondary | ICD-10-CM

## 2020-10-14 DIAGNOSIS — A029 Salmonella infection, unspecified: Secondary | ICD-10-CM

## 2020-10-14 DIAGNOSIS — A039 Shigellosis, unspecified: Secondary | ICD-10-CM | POA: Diagnosis not present

## 2020-10-14 DIAGNOSIS — A072 Cryptosporidiosis: Secondary | ICD-10-CM | POA: Diagnosis not present

## 2020-10-14 DIAGNOSIS — A09 Infectious gastroenteritis and colitis, unspecified: Secondary | ICD-10-CM | POA: Diagnosis not present

## 2020-10-14 MED ORDER — CHOLESTYRAMINE 4 G PO PACK: 4.0000 g | PACK | Freq: Two times a day (BID) | ORAL | 1 refills | Status: DC

## 2020-10-14 NOTE — Progress Notes (Signed)
Subjective:    Patient ID: Christopher Carrillo, male    DOB: 1971/01/14, 50 y.o.   MRN: [id number]  HPI Patient is a very pleasant 50 year old Caucasian male with a history of alcohol cirrhosis, hepatic encephalopathy, and psoriatic arthritis who recently went to [place] on a trip.  Shortly after returning from [place] he developed severe profound watery diarrhea.  However he believes that he may have gotten ill due to a meal that he ate at a [employer].  Stool culture showed Cryptosporidium, Salmonella, and Shigella.  He was started initially on a Z-Pak along with Bactrim.  We switched him to Cipro and nitazoxanide.  Due to profound copious watery diarrhea, he eventually went to the emergency room where he was admitted with acute renal insufficiency, dehydration, hyponatremia, hypokalemia.  Patient was started on IV fluids and ultimately responded once he was switched to Questran and Imodium.  He has been home from the hospital now for 2 weeks having completed a course of Flagyl that was prescribed due to right-sided ascending diverticulitis seen on a CT scan.  He states that over the last 4 to 5 days, he is started having firm stool again.  He denies any blood in stool or fever or chills or nausea or vomiting or abdominal pain.  He starting to get his strength back.  He starting to gain his weight back. Past Medical History:  Diagnosis Date   Abdominal pain    Alcoholic hepatitis without ascites    Anemia    Elevated bilirubin    Essential hypertension    Psoriasis    Transaminitis    Past Surgical History:  Procedure Laterality Date   BIOPSY  07/20/2019   Procedure: BIOPSY;  Surgeon: Daneil Dolin, MD;  [facility];  Service: Endoscopy;;   BIOPSY  08/14/2019   Procedure: BIOPSY;  Surgeon: Thornton Park, MD;  Location: [facility];  Service: Gastroenterology;;   COLONOSCOPY WITH PROPOFOL N/A 07/20/2019   Procedure: COLONOSCOPY WITH PROPOFOL;  Surgeon: Daneil Dolin, MD;   [facility];  Service: Endoscopy;  Laterality: N/A;  10:15am   COLONOSCOPY WITH PROPOFOL N/A 08/14/2019   Procedure: COLONOSCOPY WITH PROPOFOL;  Surgeon: Thornton Park, MD;  Location: [facility];  Service: Gastroenterology;  Laterality: N/A;   IR PARACENTESIS  08/14/2019   POLYPECTOMY  07/20/2019   Procedure: POLYPECTOMY;  Surgeon: Daneil Dolin, MD;  [facility];  Service: Endoscopy;;   SUBMUCOSAL TATTOO INJECTION  08/14/2019   Procedure: SUBMUCOSAL TATTOO INJECTION;  Surgeon: Thornton Park, MD;  Location: [facility];  Service: Gastroenterology;;   Current Outpatient Medications on File Prior to Visit  Medication Sig Dispense Refill   LORazepam (ATIVAN) 0.5 MG tablet Take 1 tablet (0.5 mg total) by mouth 2 (two) times daily as needed for anxiety. 30 tablet 0   lactulose (CHRONULAC) 10 GM/15ML solution Take 15 mLs (10 g total) by mouth 2 (two) times daily. (Patient not taking: Reported on 10/14/2020) 946 mL 0   ondansetron (ZOFRAN) 4 MG tablet Take 1 tablet (4 mg total) by mouth daily as needed for nausea or vomiting. (Patient not taking: Reported on 10/14/2020) 30 tablet 1   No current facility-administered medications on file prior to visit.   No Known Allergies Social History   Socioeconomic History   Marital status: Married    Spouse name: Not on file   Number of children: Not on file   Years of education: Not on file   Highest education level:  Not on file  Occupational History   Not on file  Tobacco Use   Smoking status: Every Day    Packs/day: 0.33    Pack years: 0.00    Types: Cigarettes   Smokeless tobacco: Never  Vaping Use   Vaping Use: Never used  Substance and Sexual Activity   Alcohol use: Yes    Alcohol/week: 1.0 standard drink    Types: 1 Cans of beer per week    Comment: occasionally   Drug use: No   Sexual activity: Yes  Other Topics Concern   Not on file  Social History Narrative   Not on file   Social Determinants of Health    Financial Resource Strain: Not on file  Food Insecurity: Not on file  Transportation Needs: Not on file  Physical Activity: Not on file  Stress: Not on file  Social Connections: Not on file  Intimate Partner Violence: Not on file     Review of Systems     Objective:   Physical Exam Vitals reviewed.  Constitutional:      Appearance: Normal appearance. He is normal weight. He is not ill-appearing or toxic-appearing.  Cardiovascular:     Rate and Rhythm: Normal rate and regular rhythm.     Heart sounds: Normal heart sounds. No murmur heard.   No friction rub. No gallop.  Pulmonary:     Effort: Pulmonary effort is normal. No respiratory distress.     Breath sounds: Normal breath sounds. No stridor. No wheezing, rhonchi or rales.  Abdominal:     General: Abdomen is flat. Bowel sounds are normal. There is no distension.     Palpations: Abdomen is soft.     Tenderness: There is no abdominal tenderness. There is no guarding or rebound.  Neurological:     Mental Status: He is alert.          Assessment & Plan:  Diarrhea of infectious origin - Plan: Magnesium, COMPLETE METABOLIC PANEL WITH GFR, CBC with Differential/Platelet  Salmonellosis - Plan: Magnesium, COMPLETE METABOLIC PANEL WITH GFR, CBC with Differential/Platelet  Shigellosis - Plan: Magnesium, COMPLETE METABOLIC PANEL WITH GFR, CBC with Differential/Platelet  Cryptosporidial gastroenteritis ([place]) - Plan: Magnesium, COMPLETE METABOLIC PANEL WITH GFR, CBC with Differential/Platelet Patient has stopped the oxycodone.  He is completed all antibiotics.  He is still taking Questran and he is now starting to have firm stool.  I will refill his Questran and also check a CMP and a magnesium level to monitor his electrolytes.  Thankfully starting to recover.  I did recommend adding a probiotic to try to help with some of the loose soft mushy stool that he still experiencing.

## 2020-10-15 LAB — CBC WITH DIFFERENTIAL/PLATELET
Absolute Monocytes: 941 cells/uL (ref 200–950)
Basophils Absolute: 101 cells/uL (ref 0–200)
Basophils Relative: 1.2 %
Eosinophils Absolute: 168 cells/uL (ref 15–500)
Eosinophils Relative: 2 %
HCT: 44.3 % (ref 38.5–50.0)
Hemoglobin: 15.4 g/dL (ref 13.2–17.1)
Lymphs Abs: 2596 cells/uL (ref 850–3900)
MCH: 34.8 pg — ABNORMAL HIGH (ref 27.0–33.0)
MCHC: 34.8 g/dL (ref 32.0–36.0)
MCV: 100 fL (ref 80.0–100.0)
MPV: 10 fL (ref 7.5–12.5)
Monocytes Relative: 11.2 %
Neutro Abs: 4595 cells/uL (ref 1500–7800)
Neutrophils Relative %: 54.7 %
Platelets: 423 10*3/uL — ABNORMAL HIGH (ref 140–400)
RBC: 4.43 10*6/uL (ref 4.20–5.80)
RDW: 13.5 % (ref 11.0–15.0)
Total Lymphocyte: 30.9 %
WBC: 8.4 10*3/uL (ref 3.8–10.8)

## 2020-10-15 LAB — COMPLETE METABOLIC PANEL WITH GFR
AG Ratio: 1.5 (calc) (ref 1.0–2.5)
ALT: 26 U/L (ref 9–46)
AST: 39 U/L (ref 10–40)
Albumin: 4.1 g/dL (ref 3.6–5.1)
Alkaline phosphatase (APISO): 295 U/L — ABNORMAL HIGH (ref 36–130)
BUN/Creatinine Ratio: 9 (calc) (ref 6–22)
BUN: 6 mg/dL — ABNORMAL LOW (ref 7–25)
CO2: 24 mmol/L (ref 20–32)
Calcium: 9.2 mg/dL (ref 8.6–10.3)
Chloride: 104 mmol/L (ref 98–110)
Creat: 0.64 mg/dL (ref 0.60–1.35)
GFR, Est African American: 134 mL/min/{1.73_m2} (ref >=60)
GFR, Est Non African American: 116 mL/min/{1.73_m2} (ref >=60)
Globulin: 2.7 g/dL (calc) (ref 1.9–3.7)
Glucose, Bld: 110 mg/dL — ABNORMAL HIGH (ref 65–99)
Potassium: 4.2 mmol/L (ref 3.5–5.3)
Sodium: 140 mmol/L (ref 135–146)
Total Bilirubin: 0.8 mg/dL (ref 0.2–1.2)
Total Protein: 6.8 g/dL (ref 6.1–8.1)

## 2020-10-15 LAB — MAGNESIUM: Magnesium: 1.9 mg/dL (ref 1.5–2.5)

## 2020-10-20 ENCOUNTER — Ambulatory Visit: Payer: 59 | Admitting: Dermatology

## 2020-10-20 ENCOUNTER — Other Ambulatory Visit: Payer: Self-pay | Admitting: *Deleted

## 2020-10-20 DIAGNOSIS — R748 Abnormal levels of other serum enzymes: Secondary | ICD-10-CM

## 2020-10-24 ENCOUNTER — Other Ambulatory Visit: Payer: Self-pay

## 2020-10-24 ENCOUNTER — Other Ambulatory Visit: Payer: 59

## 2020-10-24 DIAGNOSIS — R748 Abnormal levels of other serum enzymes: Secondary | ICD-10-CM | POA: Diagnosis not present

## 2020-10-24 LAB — GAMMA GT: GGT: 576 U/L — ABNORMAL HIGH (ref 3–95)

## 2020-10-25 ENCOUNTER — Other Ambulatory Visit: Payer: Self-pay | Admitting: *Deleted

## 2020-10-25 DIAGNOSIS — R748 Abnormal levels of other serum enzymes: Secondary | ICD-10-CM

## 2020-12-01 ENCOUNTER — Other Ambulatory Visit: Payer: Self-pay

## 2020-12-01 ENCOUNTER — Telehealth: Payer: Self-pay | Admitting: *Deleted

## 2020-12-01 ENCOUNTER — Ambulatory Visit (INDEPENDENT_AMBULATORY_CARE_PROVIDER_SITE_OTHER): Payer: 59 | Admitting: Family Medicine

## 2020-12-01 ENCOUNTER — Encounter: Payer: Self-pay | Admitting: Family Medicine

## 2020-12-01 ENCOUNTER — Other Ambulatory Visit: Payer: 59

## 2020-12-01 ENCOUNTER — Other Ambulatory Visit: Payer: Self-pay | Admitting: *Deleted

## 2020-12-01 VITALS — BP 138/68 | HR 96 | Temp 98.5°F | Resp 14 | Ht 68.0 in | Wt 199.0 lb

## 2020-12-01 DIAGNOSIS — R300 Dysuria: Secondary | ICD-10-CM | POA: Diagnosis not present

## 2020-12-01 DIAGNOSIS — R748 Abnormal levels of other serum enzymes: Secondary | ICD-10-CM

## 2020-12-01 NOTE — Progress Notes (Signed)
Subjective:    Patient ID: Christopher Carrillo, male    DOB: 1970/06/07, 50 y.o.   MRN: [id number]  HPI Patient is a very nice 50 year old Caucasian gentleman who presents today with right upper quadrant abdominal pain, and fatigue.  At his last office visit, his alkaline phosphatase was almost 300 however his regular liver function tests were normal.  He has a history of cirrhosis secondary to alcohol however there was no active liver inflammation on his lab work.  I performed a GGT which was also elevated at approximately 600.  We recommended an ERCP however the patient politely declined further work-up.  He states however that for the last week or so he has been developing nausea and vomiting and right upper quadrant abdominal pain.  He is also having some diarrhea.  He states that he feels foggy like the lactulose is not working.  He is trying to restrict his alcohol consumption.  Today on exam he is tender in the right upper quadrant. Past Medical History:  Diagnosis Date   Abdominal pain    Alcoholic hepatitis without ascites    Anemia    Elevated bilirubin    Essential hypertension    Psoriasis    Transaminitis    Past Surgical History:  Procedure Laterality Date   BIOPSY  07/20/2019   Procedure: BIOPSY;  Surgeon: Daneil Dolin, MD;  [facility];  Service: Endoscopy;;   BIOPSY  08/14/2019   Procedure: BIOPSY;  Surgeon: Thornton Park, MD;  Location: [place];  Service: Gastroenterology;;   COLONOSCOPY WITH PROPOFOL N/A 07/20/2019   Procedure: COLONOSCOPY WITH PROPOFOL;  Surgeon: Daneil Dolin, MD;  [facility];  Service: Endoscopy;  Laterality: N/A;  10:15am   COLONOSCOPY WITH PROPOFOL N/A 08/14/2019   Procedure: COLONOSCOPY WITH PROPOFOL;  Surgeon: Thornton Park, MD;  Location: [place];  Service: Gastroenterology;  Laterality: N/A;   IR PARACENTESIS  08/14/2019   POLYPECTOMY  07/20/2019   Procedure: POLYPECTOMY;  Surgeon: Daneil Dolin, MD;   [facility];  Service: Endoscopy;;   SUBMUCOSAL TATTOO INJECTION  08/14/2019   Procedure: SUBMUCOSAL TATTOO INJECTION;  Surgeon: Thornton Park, MD;  Location: [place];  Service: Gastroenterology;;   Current Outpatient Medications on File Prior to Visit  Medication Sig Dispense Refill   lactulose (CHRONULAC) 10 GM/15ML solution Take 15 mLs (10 g total) by mouth 2 (two) times daily. 946 mL 0   LORazepam (ATIVAN) 0.5 MG tablet Take 1 tablet (0.5 mg total) by mouth 2 (two) times daily as needed for anxiety. (Patient not taking: Reported on 12/01/2020) 30 tablet 0   ondansetron (ZOFRAN) 4 MG tablet Take 1 tablet (4 mg total) by mouth daily as needed for nausea or vomiting. (Patient not taking: No sig reported) 30 tablet 1   No current facility-administered medications on file prior to visit.   No Known Allergies Social History   Socioeconomic History   Marital status: Married    Spouse name: Not on file   Number of children: Not on file   Years of education: Not on file   Highest education level: Not on file  Occupational History   Not on file  Tobacco Use   Smoking status: Every Day    Packs/day: 0.33    Types: Cigarettes   Smokeless tobacco: Never  Vaping Use   Vaping Use: Never used  Substance and Sexual Activity   Alcohol use: Yes    Alcohol/week: 1.0 standard drink  Types: 1 Cans of beer per week    Comment: occasionally   Drug use: No   Sexual activity: Yes  Other Topics Concern   Not on file  Social History Narrative   Not on file   Social Determinants of Health   Financial Resource Strain: Not on file  Food Insecurity: Not on file  Transportation Needs: Not on file  Physical Activity: Not on file  Stress: Not on file  Social Connections: Not on file  Intimate Partner Violence: Not on file     Review of Systems     Objective:   Physical Exam Vitals reviewed.  Constitutional:      Appearance: Normal appearance. He is normal weight. He  is not ill-appearing or toxic-appearing.  Cardiovascular:     Rate and Rhythm: Normal rate and regular rhythm.     Heart sounds: Normal heart sounds. No murmur heard.   No friction rub. No gallop.  Pulmonary:     Effort: Pulmonary effort is normal. No respiratory distress.     Breath sounds: Normal breath sounds. No stridor. No wheezing, rhonchi or rales.  Abdominal:     General: Abdomen is flat. Bowel sounds are normal. There is no distension.     Palpations: Abdomen is soft.     Tenderness: There is abdominal tenderness. There is no guarding or rebound.    Neurological:     Mental Status: He is alert.          Assessment & Plan:  Elevated alkaline phosphatase measurement - Plan: CBC with Differential/Platelet, COMPLETE METABOLIC PANEL WITH GFR, Lipase CT scan in June showed no gallstones.  Showed no biliary ductal dilatation or abnormal findings within the pancreas however alkaline phosphatase and GGT were extremely high.  I will repeat these today.  I am concerned about an obstruction in the biliary tract potentially causing the patient's pain.  He still has his gallbladder.  If liver function test, lipase, and alk phos are all elevated, would recommend going to the emergency room for urgent ERCP to rule out cholangitis from an acute blockage of the common duct.  If labs show isolated alk phos and GGT elevations again, I would recommend outpatient ERCP to evaluate for any potential partial obstruction.

## 2020-12-01 NOTE — Telephone Encounter (Signed)
Received call from patient.   Reports that he forgot to mention that urine has been slightly concentrated and he has burning with urination.   Advised to return to office for UA.  Orders placed.

## 2020-12-01 NOTE — Telephone Encounter (Signed)
Call placed to patient and patient made aware.  

## 2020-12-02 LAB — URINE CULTURE
MICRO NUMBER:: 12291009
Result:: NO GROWTH
SPECIMEN QUALITY:: ADEQUATE

## 2020-12-02 LAB — CBC WITH DIFFERENTIAL/PLATELET
Absolute Monocytes: 656 cells/uL (ref 200–950)
Basophils Absolute: 63 cells/uL (ref 0–200)
Basophils Relative: 1.1 %
Eosinophils Absolute: 91 cells/uL (ref 15–500)
Eosinophils Relative: 1.6 %
HCT: 54 % — ABNORMAL HIGH (ref 38.5–50.0)
Hemoglobin: 18.5 g/dL — ABNORMAL HIGH (ref 13.2–17.1)
Lymphs Abs: 1972 cells/uL (ref 850–3900)
MCH: 36.1 pg — ABNORMAL HIGH (ref 27.0–33.0)
MCHC: 34.3 g/dL (ref 32.0–36.0)
MCV: 105.3 fL — ABNORMAL HIGH (ref 80.0–100.0)
MPV: 9.6 fL (ref 7.5–12.5)
Monocytes Relative: 11.5 %
Neutro Abs: 2918 cells/uL (ref 1500–7800)
Neutrophils Relative %: 51.2 %
Platelets: 198 10*3/uL (ref 140–400)
RBC: 5.13 10*6/uL (ref 4.20–5.80)
RDW: 16 % — ABNORMAL HIGH (ref 11.0–15.0)
Total Lymphocyte: 34.6 %
WBC: 5.7 10*3/uL (ref 3.8–10.8)

## 2020-12-02 LAB — COMPLETE METABOLIC PANEL WITH GFR
AG Ratio: 1.6 (calc) (ref 1.0–2.5)
ALT: 48 U/L — ABNORMAL HIGH (ref 9–46)
AST: 67 U/L — ABNORMAL HIGH (ref 10–40)
Albumin: 4.6 g/dL (ref 3.6–5.1)
Alkaline phosphatase (APISO): 228 U/L — ABNORMAL HIGH (ref 36–130)
BUN/Creatinine Ratio: 9 (calc) (ref 6–22)
BUN: 6 mg/dL — ABNORMAL LOW (ref 7–25)
CO2: 24 mmol/L (ref 20–32)
Calcium: 9.5 mg/dL (ref 8.6–10.3)
Chloride: 105 mmol/L (ref 98–110)
Creat: 0.65 mg/dL (ref 0.60–1.29)
Globulin: 2.9 g/dL (calc) (ref 1.9–3.7)
Glucose, Bld: 98 mg/dL (ref 65–99)
Potassium: 3.7 mmol/L (ref 3.5–5.3)
Sodium: 141 mmol/L (ref 135–146)
Total Bilirubin: 0.8 mg/dL (ref 0.2–1.2)
Total Protein: 7.5 g/dL (ref 6.1–8.1)
eGFR: 116 mL/min/{1.73_m2} (ref >=60)

## 2020-12-02 LAB — URINALYSIS, ROUTINE W REFLEX MICROSCOPIC
Bacteria, UA: NONE SEEN /HPF
Glucose, UA: NEGATIVE
Hgb urine dipstick: NEGATIVE
Hyaline Cast: NONE SEEN /LPF
Ketones, ur: NEGATIVE
Leukocytes,Ua: NEGATIVE
Nitrite: NEGATIVE
RBC / HPF: NONE SEEN /HPF (ref 0–2)
Specific Gravity, Urine: 1.025 (ref 1.001–1.035)
Squamous Epithelial / HPF: NONE SEEN /HPF (ref <=5)
pH: 6.5 (ref 5.0–8.0)

## 2020-12-02 LAB — MICROSCOPIC MESSAGE

## 2020-12-02 LAB — LIPASE: Lipase: 41 U/L (ref 7–60)

## 2020-12-06 ENCOUNTER — Other Ambulatory Visit: Payer: Self-pay | Admitting: Family Medicine

## 2020-12-06 DIAGNOSIS — R1011 Right upper quadrant pain: Secondary | ICD-10-CM

## 2020-12-30 ENCOUNTER — Other Ambulatory Visit: Payer: Self-pay

## 2020-12-30 ENCOUNTER — Ambulatory Visit
Admission: RE | Admit: 2020-12-30 | Discharge: 2020-12-30 | Disposition: A | Discharge status: Home / Self Care | Payer: 59 | Source: Ambulatory Visit | Attending: Family Medicine | Admitting: Family Medicine

## 2020-12-30 DIAGNOSIS — K7460 Unspecified cirrhosis of liver: Secondary | ICD-10-CM | POA: Diagnosis not present

## 2020-12-30 DIAGNOSIS — R1011 Right upper quadrant pain: Secondary | ICD-10-CM

## 2020-12-30 DIAGNOSIS — R935 Abnormal findings on diagnostic imaging of other abdominal regions, including retroperitoneum: Secondary | ICD-10-CM | POA: Diagnosis not present

## 2020-12-30 DIAGNOSIS — K760 Fatty (change of) liver, not elsewhere classified: Secondary | ICD-10-CM | POA: Diagnosis not present

## 2020-12-30 MED ORDER — GADOBENATE DIMEGLUMINE 529 MG/ML IV SOLN
18.0000 mL | Freq: Once | INTRAVENOUS | Status: AC | PRN
  Administered 2020-12-30: 18 mL via INTRAVENOUS

## 2021-01-10 ENCOUNTER — Ambulatory Visit: Payer: 59 | Admitting: Gastroenterology

## 2021-02-06 ENCOUNTER — Other Ambulatory Visit: Payer: Self-pay

## 2021-02-06 ENCOUNTER — Ambulatory Visit (INDEPENDENT_AMBULATORY_CARE_PROVIDER_SITE_OTHER): Payer: 59 | Admitting: Gastroenterology

## 2021-02-06 ENCOUNTER — Encounter: Payer: Self-pay | Admitting: Family Medicine

## 2021-02-06 ENCOUNTER — Ambulatory Visit (INDEPENDENT_AMBULATORY_CARE_PROVIDER_SITE_OTHER): Payer: 59 | Admitting: Family Medicine

## 2021-02-06 ENCOUNTER — Encounter: Payer: Self-pay | Admitting: Gastroenterology

## 2021-02-06 VITALS — BP 124/76 | HR 100 | Temp 98.7°F | Resp 14 | Ht 68.0 in | Wt 203.0 lb

## 2021-02-06 VITALS — BP 120/80 | HR 99 | Ht 68.0 in | Wt 203.0 lb

## 2021-02-06 DIAGNOSIS — K7030 Alcoholic cirrhosis of liver without ascites: Secondary | ICD-10-CM

## 2021-02-06 DIAGNOSIS — K709 Alcoholic liver disease, unspecified: Secondary | ICD-10-CM

## 2021-02-06 DIAGNOSIS — K760 Fatty (change of) liver, not elsewhere classified: Secondary | ICD-10-CM | POA: Diagnosis not present

## 2021-02-06 DIAGNOSIS — R17 Unspecified jaundice: Secondary | ICD-10-CM | POA: Diagnosis not present

## 2021-02-06 DIAGNOSIS — K7469 Other cirrhosis of liver: Secondary | ICD-10-CM | POA: Diagnosis not present

## 2021-02-06 DIAGNOSIS — Z23 Encounter for immunization: Secondary | ICD-10-CM

## 2021-02-06 DIAGNOSIS — R1010 Upper abdominal pain, unspecified: Secondary | ICD-10-CM

## 2021-02-06 DIAGNOSIS — R7989 Other specified abnormal findings of blood chemistry: Secondary | ICD-10-CM | POA: Diagnosis not present

## 2021-02-06 DIAGNOSIS — R140 Abdominal distension (gaseous): Secondary | ICD-10-CM

## 2021-02-06 DIAGNOSIS — R69 Illness, unspecified: Secondary | ICD-10-CM | POA: Diagnosis not present

## 2021-02-06 MED ORDER — ESCITALOPRAM OXALATE 10 MG PO TABS: 10.0000 mg | ORAL_TABLET | Freq: Every day | ORAL | 5 refills | Status: DC

## 2021-02-06 MED ORDER — PANTOPRAZOLE SODIUM 40 MG PO TBEC: 40.0000 mg | DELAYED_RELEASE_TABLET | Freq: Two times a day (BID) | ORAL | 5 refills | Status: DC

## 2021-02-06 NOTE — Progress Notes (Signed)
Subjective:    Patient ID: Christopher Carrillo, male    DOB: [birth year], 50 y.o.   MRN: [id number]  HPI 12/01/20 Patient is a very nice 50 year old Caucasian gentleman who presents today with right upper quadrant abdominal pain, and fatigue.  At his last office visit, his alkaline phosphatase was almost 300 however his regular liver function tests were normal.  He has a history of cirrhosis secondary to alcohol however there was no active liver inflammation on his lab work.  I performed a GGT which was also elevated at approximately 600.  We recommended an ERCP however the patient politely declined further work-up.  He states however that for the last week or so he has been developing nausea and vomiting and right upper quadrant abdominal pain.  He is also having some diarrhea.  He states that he feels foggy like the lactulose is not working.  He is trying to restrict his alcohol consumption.  Today on exam he is tender in the right upper quadrant.  At that time, my plan was: CT scan in June showed no gallstones.  Showed no biliary ductal dilatation or abnormal findings within the pancreas however alkaline phosphatase and GGT were extremely high.  I will repeat these today.  I am concerned about an obstruction in the biliary tract potentially causing the patient's pain.  He still has his gallbladder.  If liver function test, lipase, and alk phos are all elevated, would recommend going to the emergency room for urgent ERCP to rule out cholangitis from an acute blockage of the common duct.  If labs show isolated alk phos and GGT elevations again, I would recommend outpatient ERCP to evaluate for any potential partial obstruction.   Ultimately had MRCP: FINDINGS: Lower chest: No acute findings.   Hepatobiliary: Moderate diffuse hepatic steatosis is again demonstrated on chemical shift imaging. Gross morphologic changes cirrhosis are seen including caudate and left lobe hypertrophy and mild capsular  nodularity. No hepatic masses are identified. Gallbladder is unremarkable. No evidence of biliary ductal dilatation.   Pancreas: No mass or inflammatory changes. No evidence of pancreatic ductal dilatation.   Spleen:  Within normal limits in size and appearance.   Adrenals/Urinary Tract: No masses identified. No evidence of hydronephrosis.   Stomach/Bowel: Visualized portion unremarkable.   Vascular/Lymphatic: No pathologically enlarged lymph nodes identified. No acute vascular findings.   Other:  None.  No evidence of abdominal ascites.   Musculoskeletal:  No suspicious bone lesions identified.    02/06/21 Patient is here today for follow-up.  He saw his gastroenterologist this morning.  I reviewed his gastroenterologist note.  She would like [place] to check a CBC CMP PT/INR AFP and direct bilirubin.  I am happy to do that.  Per my last labs, the patient has Marcello Moores level a cirrhosis.  GI is planning an ultrasound to evaluate for esophageal varices as well as to monitor for ascites.  Patient reports fatigue.  He states that he is not sleeping well.  His mind just races at night.  He is also having panic attacks occasionally.  In the past he took Zoloft for 2 weeks but stopped the medication due to how the medicine made him feel.  I want to avoid any centrally acting medications as the patient has had a history of hepatic encephalopathy in the past.  Therefore I have recommended against any Ambien, Xanax, or Ativan.  Patient states that in the past he was able to drink to help himself sleep however he  is now abstinent from alcohol and therefore he is having more difficulty sleeping. Past Medical History:  Diagnosis Date   Abdominal pain    Alcoholic hepatitis without ascites    Anemia    Elevated bilirubin    Essential hypertension    Psoriasis    Transaminitis    Past Surgical History:  Procedure Laterality Date   BIOPSY  07/20/2019   Procedure: BIOPSY;  Surgeon: Daneil Dolin,  MD;  [facility];  Service: Endoscopy;;   BIOPSY  08/14/2019   Procedure: BIOPSY;  Surgeon: Thornton Park, MD;  Location: [facility];  Service: Gastroenterology;;   COLONOSCOPY WITH PROPOFOL N/A 07/20/2019   Procedure: COLONOSCOPY WITH PROPOFOL;  Surgeon: Daneil Dolin, MD;  [facility];  Service: Endoscopy;  Laterality: N/A;  10:15am   COLONOSCOPY WITH PROPOFOL N/A 08/14/2019   Procedure: COLONOSCOPY WITH PROPOFOL;  Surgeon: Thornton Park, MD;  Location: [place];  Service: Gastroenterology;  Laterality: N/A;   IR PARACENTESIS  08/14/2019   POLYPECTOMY  07/20/2019   Procedure: POLYPECTOMY;  Surgeon: Daneil Dolin, MD;  [facility];  Service: Endoscopy;;   SUBMUCOSAL TATTOO INJECTION  08/14/2019   Procedure: SUBMUCOSAL TATTOO INJECTION;  Surgeon: Thornton Park, MD;  Location: [place] [facility];  Service: Gastroenterology;;   Current Outpatient Medications on File Prior to Visit  Medication Sig Dispense Refill   lactulose (CHRONULAC) 10 GM/15ML solution Take 15 mLs (10 g total) by mouth 2 (two) times daily. 946 mL 0   pantoprazole (PROTONIX) 40 MG tablet Take 1 tablet (40 mg total) by mouth 2 (two) times daily before a meal. 60 tablet 5   No current facility-administered medications on file prior to visit.   No Known Allergies Social History   Socioeconomic History   Marital status: Married    Spouse name: Not on file   Number of children: Not on file   Years of education: Not on file   Highest education level: Not on file  Occupational History   Not on file  Tobacco Use   Smoking status: Every Day    Packs/day: 0.33    Types: Cigarettes   Smokeless tobacco: Never  Vaping Use   Vaping Use: Never used  Substance and Sexual Activity   Alcohol use: Yes    Alcohol/week: 1.0 standard drink    Types: 1 Cans of beer per week    Comment: occasionally   Drug use: No   Sexual activity: Yes  Other Topics Concern   Not on file  Social  History Narrative   Not on file   Social Determinants of Health   Financial Resource Strain: Not on file  Food Insecurity: Not on file  Transportation Needs: Not on file  Physical Activity: Not on file  Stress: Not on file  Social Connections: Not on file  Intimate Partner Violence: Not on file     Review of Systems     Objective:   Physical Exam Vitals reviewed.  Constitutional:      Appearance: Normal appearance. He is normal weight. He is not ill-appearing or toxic-appearing.  Cardiovascular:     Rate and Rhythm: Normal rate and regular rhythm.     Heart sounds: Normal heart sounds. No murmur heard.   No friction rub. No gallop.  Pulmonary:     Effort: Pulmonary effort is normal. No respiratory distress.     Breath sounds: Normal breath sounds. No stridor. No wheezing, rhonchi or rales.  Abdominal:  General: Abdomen is flat. Bowel sounds are normal. There is no distension.     Palpations: Abdomen is soft.     Tenderness: There is no abdominal tenderness. There is no guarding or rebound.  Neurological:     Mental Status: He is alert.          Assessment & Plan:  Alcoholic cirrhosis, unspecified whether ascites present ([place]) - Plan: CBC with Differential/Platelet, COMPLETE METABOLIC PANEL WITH GFR, Bilirubin, direct, PT with INR/Fingerstick, AFP tumor marker I will gladly draw the requisite labs recommended by his gastroenterologist.  However I feel that the patient's anxiety and depression will be better treated with Lexapro 10 mg a day than taking just a sleeping pill.  Have asked the patient to give me 4 to 5 weeks to see if the medication will help.  We will complete his Twinrix immunization schedule, give him Pneumovax 23 and also the flu shot.  Reassess in 4 to 6 weeks to see how he is doing on the medication

## 2021-02-06 NOTE — Progress Notes (Signed)
Referring Provider: Susy Frizzle, MD Primary Care Physician:  Susy Frizzle, MD  Chief complaint:  Elevated liver enzymes   IMPRESSION:  Increasing alk phos and GGTP    - no evidence for obstruction on CT 6/22 or MRI/MRCP 9/22 RUQ pain with gas/bloat without ascites on exam or imaging studies    - Possible SIBO which is common in the setting of advanced liver disease    - no previous improvement with empiric Xifaxan Acute ascending colon diverticulitis 09/2020 Infectious diarrhea due to shigella, salmonella, and cryptosporidium  Hyponatremia during recent hospitalization Alcoholic hepatitis with suspected underlying cirrhosis    -  treated with steroids 07/2019-08/2019    - ongoing alcohol use without patient interest in abstinence    - labs today to monitor for response to therapy, hepatic synthetic dysfunction    - no prior EGD to screen for varices History of encephalopathy while hospitalized    - on lactulose as an outpatient, using PRN    - no symptoms consistent with HE today History of ascites, not currently on diuretics     - 2000 mg sodium restricted diet History of hematochezia due to hemorrhoids Tobacco habituation Ongoing alcohol use  Recent increase in alk phos without increase in other liver enzymes likely reflect ongoing alcohol use +/- concurrent NASH but it may have been worsened by concurrent infectious diarrhea, diverticulitis, and antibioticuse. MRI is overall reassuring showing no obstruction, dilation, or mass. No indication for ERCP given the normal MRCP. Anxiety may be contributing to his symptoms as well as his ongoing alcohol use.  Could consider evaluation for symptomatic gallbladder disease if pain becomes a more predominant symptom. Will repeat labs today including alk phos.   He must quit drinking. At this point, he is drinking himself to death and has prevented himself from being a liver transplant candidate when his liver disease progresses.      PLAN: - 2000 mg sodium restricted diet - Pantoprazole 40 mg BID - CMP, direct bilirubin, CBC, PT/INR, AFP (he requested to have these labs performed with Dr. Dennard Schaumann) - Consider repeating Xifaxan given his antibiotic exposure if no improvement with pantoprazole - Abdominal ultrasound for hepatocellular carcinoma 06/2021 - Counseled to abstain from all alcohol, formal counseling recommended - Flu vaccine, Pneumovax recommended - Complete Twinrix vaccine as previously planned (receiving through PCP) - EGD to screen for varices  - Follow-up in 2-3 months, earlier if needed  Please see the "Patient Instructions" section for addition details about the plan.  HPI: Christopher Carrillo is a 50 y.o. male who was re-referred by Dr. Dennard Schaumann for abnormal liver enzymes.  He was hospitalized in April 2021 and again in May 2021 for acute alcoholic hepatitis and suspected cirrhosis and hematochezia that was ultimately attributed to hemorrhoidal bleeding. He was last seen in the office for cirrhosis 02/05/20.    He had a colonoscopy on 07/08/2019 due to what felt to be a colon mass, but subsequently he was diagnosed with intermittent ileocecal intussusception after he had benign enteric mucosa findings.  The patient was admitted from 07/21/2019 to 07/26/2019 and treated for confusion, colitis and rising bilirubin level at [facility].  It he was believed that he may have developed hepatitis induced by ketamine and propofol in the setting of chronic alcohol abuse.  He was subsequently admitted again at [facility] 07/29/2019 to 08/06/2019 for similar symptoms but with the development of lower extremity edema and confusion.  His ammonia was slightly elevated and he was started on  lactulose.  He was discharged on prednisolone for possible alcohol induced hepatitis, as well as furosemide, Aldactone and lactulose. He was hospitalized 08/12/19-08/15/19 for painless hematochezia ultimately attributed to hemorrhoids.    He has an appointment  with the [facility] 08/26/2019. He was told that he needed to stop using alcohol and cigarettes to be considered for liver transplant. Thought they did not want him to return, although review of limited records from them in Epic shows that a 3 month follow-up appointment was recommended.   At the time of his last office visit 02/05/20 he had completed his steroid taper. He had reported post-prandial bloating and abdominal distension within 30 minutes of eating. He found lactulose causing severe bloating and diarrhea. He was able to discontinue all diuretics.  He was hospitalized for infectious diarrhea with a GI pathogen panel positive for shigella, salmonella, and cryptosporidium presenting with hyponatremia, hypokalemia, and acute kidney injury after a trip to [place]. CT abd/pelvis with contrast 09/28/20 showed acute diverticulitis in the proximal ascending colon, fatty liver, and cirrhosis. He was treated with Bactrim and Zithromax but did not start to clinically improve until he was treated with Cipro and nitazoxanide.    Rereferred today because of elevated liver enzymes and upper abdominal pain that he attributes to his liver. Associated bloating that progresses over the day with associated distension. He feels pregnancy by the end of the day.  07/31/19: GGTP 236 08/12/19: alk phos 129 02/05/20: alk phos 145 09/16/20: alk phos 169 09/22/20: alk phos 201 09/23/20: alk phos 150 09/25/20: alk phos 135 09/26/20: alk phos 132 09/28/20: alk phos 141 09/29/20: alk phos 227 09/30/20: alk phos 267 10/14/20: AST 39, ALT 26 10/24/20: GGTP 576 12/01/20: AST 67, ALT 48  He continues to drink some beer, although admits that he has significantly cut back. When he does drink, he won't drink more than 2-4 ounces.   Dr. Dennard Schaumann noted that his alkaline phosphatase was almost 300 and a GGTP at nearly 600. MRI/MRCP 01/02/21 showed hepatic cirrhosis and diffuse steatosis without biliary obstruction.   He is using  lactulose for foggy-headedness. He is unable to quantify how often he is taking this but it sounds like he only takes it when his wife insists.   He reported similar symptoms when I saw him last year. He took 2 weeks of Xifaxan last year without change in symptoms. We discussed FDGard but he can't remember taking that.   He wonders if his symptoms are related to anxiety. He was given Ativan but he didn't like how that made him feel.   Past Medical History:  Diagnosis Date   Abdominal pain    Alcoholic hepatitis without ascites    Anemia    Elevated bilirubin    Essential hypertension    Psoriasis    Transaminitis     Past Surgical History:  Procedure Laterality Date   BIOPSY  07/20/2019   Procedure: BIOPSY;  Surgeon: Daneil Dolin, MD;  [facility];  Service: Endoscopy;;   BIOPSY  08/14/2019   Procedure: BIOPSY;  Surgeon: Thornton Park, MD;  Location: [place];  Service: Gastroenterology;;   COLONOSCOPY WITH PROPOFOL N/A 07/20/2019   Procedure: COLONOSCOPY WITH PROPOFOL;  Surgeon: Daneil Dolin, MD;  [facility];  Service: Endoscopy;  Laterality: N/A;  10:15am   COLONOSCOPY WITH PROPOFOL N/A 08/14/2019   Procedure: COLONOSCOPY WITH PROPOFOL;  Surgeon: Thornton Park, MD;  Location: [place];  Service: Gastroenterology;  Laterality: N/A;  IR PARACENTESIS  08/14/2019   POLYPECTOMY  07/20/2019   Procedure: POLYPECTOMY;  Surgeon: Daneil Dolin, MD;  [facility];  Service: Endoscopy;;   SUBMUCOSAL TATTOO INJECTION  08/14/2019   Procedure: SUBMUCOSAL TATTOO INJECTION;  Surgeon: Thornton Park, MD;  [facility];  Service: Gastroenterology;;    Current Outpatient Medications  Medication Sig Dispense Refill   lactulose (CHRONULAC) 10 GM/15ML solution Take 15 mLs (10 g total) by mouth 2 (two) times daily. 946 mL 0   pantoprazole (PROTONIX) 40 MG tablet Take 1 tablet (40 mg total) by mouth 2 (two) times daily before a meal.  (Patient not taking: Reported on 02/06/2021) 60 tablet 5   escitalopram (LEXAPRO) 10 MG tablet Take 1 tablet (10 mg total) by mouth daily. 30 tablet 5   No current facility-administered medications for this visit.    Allergies as of 02/06/2021   (No Known Allergies)    Family History  Problem Relation Age of Onset   Asthma Father    Cancer Sister 76       Pharyngeal   Colon cancer Neg Hx    Liver disease Neg Hx       Physical Exam: General:   Alert, in NAD. No scleral icterus. No bilateral temporal wasting.  Heart:  Regular rate and rhythm; no murmurs Pulm: Clear anteriorly; no wheezing Abdomen:  Soft. Nontender. Nondistended. Normal bowel sounds. No rebound or guarding. No fluid wave. I am unable to reproduce his abdominal pain.  LAD: No inguinal or umbilical LAD Extremities:  Without edema. Neurologic:  Alert and  oriented x4;  grossly normal neurologically; no asterixis or clonus. Skin: No jaundice. No palmar erythema. Multiple spider angioma on the chest wall. Terry's nails.  Psych:  Alert and cooperative. Normal mood and affect.   Yamili Lichtenwalner L. Tarri Glenn, MD, MPH 02/06/2021, 9:08 PM

## 2021-02-06 NOTE — Addendum Note (Signed)
Addended by: Sheral Flow on: 02/06/2021 03:23 PM   Modules accepted: Orders

## 2021-02-06 NOTE — Patient Instructions (Addendum)
It was my pleasure to provide care to you today. Based on our discussion, I am providing you with my recommendations below:  RECOMMENDATION(S):   I have recommend a trial of pantoprazole twice daily to see if this helps with your bloating. An upper endoscopy is also recommended to evaluate these symptoms and to screen for esophageal varices.   I recommend that you not drink any alcohol including beer, wine, liquor, and non-alcoholic beer. The [facility] would require abstinence and formal counseling prior to considering a liver transplant if/when your liver disease progresses in the future. Your liver disease is ongoing going to get worse if you continue to drink alcohol, even in very small amounts.   I think your recent liver enzyme changes reflect your underlying liver disease and inflammation from ongoing alcohol use.   You should have an imaging study every 6 months to monitor for the development of hepatocellular carcinoma (liver cancer). I reviewed your recent MRI so an ultrasound would be due in March 2023. The risk is low, but, if liver cancer is diagnosed early, there are better treatment options.   I recommend a high-protein, primarily plant-based diet.  Avoid red meat.  No raw or undercooked meat, seafood, or shellfish.  Work to maintain a health weight. Minimize salt intake.  Please do not consume more than 2000 mg of sodium every day.  I recommend that you take a multivitamin every day.  Stay active. Weight-based exercise for 30 minutes at least 3 days a week is recommended.  I have recommend that you be vaccinated for hepatitis A, hepatitis B, the seasonal flu vaccine and the Pneumovax, if you have not already had them.   You are at increased risk of osteopenia and osteoporosis. You should be screened for these metabolic bone diseases if you have not already had the testing performed.   The [employer] and UpToDate are good websites for information about  your liver.    LABS:   Please proceed to the basement level for lab work before leaving today. Press "B" on the elevator. The lab is located at the first door on the left as you exit the elevator.  ENDOSCOPY:   You have been scheduled for an endoscopy. Please follow written instructions given to you at your visit today.  INHALERS:   If you use inhalers (even only as needed), please bring them with you on the day of your procedure.  FOLLOW UP:  I would like for you to follow up with me in 4-6 months. Please call the office at [phone] to schedule your appointment.   BMI:  If you are age 48 or older, your body mass index should be between 23-30. Your Body mass index is 30.87 kg/m. If this is out of the aforementioned range listed, please consider follow up with your Primary Care Provider.  If you are age 26 or younger, your body mass index should be between 19-25. Your Body mass index is 30.87 kg/m. If this is out of the aformentioned range listed, please consider follow up with your Primary Care Provider.   MY CHART:  The [facility] providers would like to encourage you to use [facility] to communicate with providers for non-urgent requests or questions.  Due to long hold times on the telephone, sending your provider a message by [facility] may be a faster and more efficient way to get a response.  Please allow 48 business hours for a response.  Please remember that this  is for non-urgent requests.   Thank you for trusting me with your gastrointestinal care!    Thornton Park, MD, MPH

## 2021-02-07 LAB — CBC WITH DIFFERENTIAL/PLATELET
Absolute Monocytes: 851 cells/uL (ref 200–950)
Basophils Absolute: 87 cells/uL (ref 0–200)
Basophils Relative: 1.3 %
Eosinophils Absolute: 141 cells/uL (ref 15–500)
Eosinophils Relative: 2.1 %
HCT: 51.4 % — ABNORMAL HIGH (ref 38.5–50.0)
Hemoglobin: 18.4 g/dL — ABNORMAL HIGH (ref 13.2–17.1)
Lymphs Abs: 2285 cells/uL (ref 850–3900)
MCH: 37.1 pg — ABNORMAL HIGH (ref 27.0–33.0)
MCHC: 35.8 g/dL (ref 32.0–36.0)
MCV: 103.6 fL — ABNORMAL HIGH (ref 80.0–100.0)
MPV: 10.5 fL (ref 7.5–12.5)
Monocytes Relative: 12.7 %
Neutro Abs: 3337 cells/uL (ref 1500–7800)
Neutrophils Relative %: 49.8 %
Platelets: 169 10*3/uL (ref 140–400)
RBC: 4.96 10*6/uL (ref 4.20–5.80)
RDW: 13.4 % (ref 11.0–15.0)
Total Lymphocyte: 34.1 %
WBC: 6.7 10*3/uL (ref 3.8–10.8)

## 2021-02-07 LAB — COMPLETE METABOLIC PANEL WITH GFR
AG Ratio: 1.5 (calc) (ref 1.0–2.5)
ALT: 53 U/L — ABNORMAL HIGH (ref 9–46)
AST: 78 U/L — ABNORMAL HIGH (ref 10–40)
Albumin: 4.5 g/dL (ref 3.6–5.1)
Alkaline phosphatase (APISO): 237 U/L — ABNORMAL HIGH (ref 36–130)
BUN/Creatinine Ratio: 10 (calc) (ref 6–22)
BUN: 6 mg/dL — ABNORMAL LOW (ref 7–25)
CO2: 23 mmol/L (ref 20–32)
Calcium: 9.5 mg/dL (ref 8.6–10.3)
Chloride: 101 mmol/L (ref 98–110)
Creat: 0.62 mg/dL (ref 0.60–1.29)
Globulin: 3 g/dL (calc) (ref 1.9–3.7)
Glucose, Bld: 148 mg/dL — ABNORMAL HIGH (ref 65–99)
Potassium: 3.7 mmol/L (ref 3.5–5.3)
Sodium: 138 mmol/L (ref 135–146)
Total Bilirubin: 1.1 mg/dL (ref 0.2–1.2)
Total Protein: 7.5 g/dL (ref 6.1–8.1)
eGFR: 118 mL/min/{1.73_m2} (ref >=60)

## 2021-02-07 LAB — BILIRUBIN, DIRECT: Bilirubin, Direct: 0.4 mg/dL — ABNORMAL HIGH (ref 0.0–0.2)

## 2021-02-07 LAB — PROTIME-INR
INR: 1.1
Prothrombin Time: 11 s (ref 9.0–11.5)

## 2021-02-07 LAB — AFP TUMOR MARKER: AFP-Tumor Marker: 7.2 ng/mL — ABNORMAL HIGH (ref <6.1)

## 2021-02-16 ENCOUNTER — Other Ambulatory Visit: Payer: Self-pay | Admitting: *Deleted

## 2021-02-16 MED ORDER — LACTULOSE 10 GM/15ML PO SOLN: 10.0000 g | Freq: Two times a day (BID) | ORAL | 0 refills | Status: DC

## 2021-02-18 ENCOUNTER — Other Ambulatory Visit: Payer: Self-pay | Admitting: Family Medicine

## 2021-02-21 DIAGNOSIS — K7682 Hepatic encephalopathy: Secondary | ICD-10-CM | POA: Diagnosis not present

## 2021-02-21 DIAGNOSIS — R69 Illness, unspecified: Secondary | ICD-10-CM | POA: Diagnosis not present

## 2021-02-22 ENCOUNTER — Telehealth: Payer: Self-pay | Admitting: *Deleted

## 2021-02-22 DIAGNOSIS — F411 Generalized anxiety disorder: Secondary | ICD-10-CM

## 2021-02-22 NOTE — Telephone Encounter (Signed)
Received call from patient.   Reports that he has been taking Lexapro x2 weeks and has not noted much improvement in his GAD and panic attacks.   States that he is aware medication will need to build up in system to be effective.   Requested referral to psych in the meantime. Referral orders placed.

## 2021-03-08 ENCOUNTER — Telehealth: Payer: Self-pay

## 2021-03-08 NOTE — Telephone Encounter (Signed)
Pt called to report he saw Arta Bruce yesterday, at [facility] across the street. Mr. Steffanie Dunn believes pt has severe anxiety and did recommend a couple of medications pt could try. Pt does not recall names, I asked him to try and reach Mr. Steffanie Dunn to get this information. He states he will call him today, and then let me know the names.   Pt also asked if you would be able to complete intermittent FMLA forms for him. Pt states he has used all his PTO time due to his anxiety and panic attacks.   Please advise, thanks!

## 2021-03-09 ENCOUNTER — Telehealth: Payer: Self-pay

## 2021-03-09 ENCOUNTER — Ambulatory Visit (AMBULATORY_SURGERY_CENTER): Payer: 59 | Admitting: Gastroenterology

## 2021-03-09 ENCOUNTER — Encounter: Payer: Self-pay | Admitting: Gastroenterology

## 2021-03-09 VITALS — BP 121/71 | HR 90 | Temp 98.9°F | Resp 14 | Ht 68.0 in | Wt 203.0 lb

## 2021-03-09 DIAGNOSIS — K7469 Other cirrhosis of liver: Secondary | ICD-10-CM

## 2021-03-09 DIAGNOSIS — K766 Portal hypertension: Secondary | ICD-10-CM | POA: Diagnosis not present

## 2021-03-09 DIAGNOSIS — K3189 Other diseases of stomach and duodenum: Secondary | ICD-10-CM

## 2021-03-09 DIAGNOSIS — K2970 Gastritis, unspecified, without bleeding: Secondary | ICD-10-CM | POA: Diagnosis not present

## 2021-03-09 DIAGNOSIS — K7460 Unspecified cirrhosis of liver: Secondary | ICD-10-CM | POA: Diagnosis not present

## 2021-03-09 DIAGNOSIS — R140 Abdominal distension (gaseous): Secondary | ICD-10-CM

## 2021-03-09 DIAGNOSIS — K31A0 Gastric intestinal metaplasia, unspecified: Secondary | ICD-10-CM | POA: Diagnosis not present

## 2021-03-09 DIAGNOSIS — R1010 Upper abdominal pain, unspecified: Secondary | ICD-10-CM

## 2021-03-09 DIAGNOSIS — K2950 Unspecified chronic gastritis without bleeding: Secondary | ICD-10-CM | POA: Diagnosis not present

## 2021-03-09 MED ORDER — SODIUM CHLORIDE 0.9 % IV SOLN
500.0000 mL | Freq: Once | INTRAVENOUS | Status: DC
Start: 2021-03-09 — End: 2021-04-07

## 2021-03-09 NOTE — Progress Notes (Signed)
Referring Provider: Susy Frizzle, MD Primary Care Physician:  Susy Frizzle, MD  Chief complaint:  Elevated liver enzymes   IMPRESSION:  Alcoholic hepatitis with suspected underlying cirrhosis and portal hypertension History of encephalopathy while hospitalized History of ascites, not currently on diuretics  Appropriate candidate for anesthesia in the [place]   PLAN: EGD to screen for varices    HPI: Christopher Carrillo is a 50 y.o. male who presents for EGD to screen for esophageal varices. Please see my office note from 02/06/21 for complete details. No significant change in history or physical exam since that time.     Past Medical History:  Diagnosis Date   Abdominal pain    Alcoholic hepatitis without ascites    Anemia    Anxiety    Arthritis    Elevated bilirubin    Essential hypertension    Psoriasis    Transaminitis     Past Surgical History:  Procedure Laterality Date   BIOPSY  07/20/2019   Procedure: BIOPSY;  Surgeon: Daneil Dolin, MD;  [facility];  Service: Endoscopy;;   BIOPSY  08/14/2019   Procedure: BIOPSY;  Surgeon: Thornton Park, MD;  Location: [place];  Service: Gastroenterology;;   COLONOSCOPY     COLONOSCOPY WITH PROPOFOL N/A 07/20/2019   Procedure: COLONOSCOPY WITH PROPOFOL;  Surgeon: Daneil Dolin, MD;  [facility];  Service: Endoscopy;  Laterality: N/A;  10:15am   COLONOSCOPY WITH PROPOFOL N/A 08/14/2019   Procedure: COLONOSCOPY WITH PROPOFOL;  Surgeon: Thornton Park, MD;  Location: [place];  Service: Gastroenterology;  Laterality: N/A;   IR PARACENTESIS  08/14/2019   POLYPECTOMY  07/20/2019   Procedure: POLYPECTOMY;  Surgeon: Daneil Dolin, MD;  [facility];  Service: Endoscopy;;   SUBMUCOSAL TATTOO INJECTION  08/14/2019   Procedure: SUBMUCOSAL TATTOO INJECTION;  Surgeon: Thornton Park, MD;  [facility];  Service: Gastroenterology;;    Current Outpatient  Medications  Medication Sig Dispense Refill   lactulose, encephalopathy, (CHRONULAC) 10 GM/15ML SOLN TAKE 15MLS BY MOUTH TWICE DAILY 946 mL 2   escitalopram (LEXAPRO) 10 MG tablet Take 1 tablet (10 mg total) by mouth daily. (Patient not taking: Reported on 03/09/2021) 30 tablet 5   pantoprazole (PROTONIX) 40 MG tablet Take 1 tablet (40 mg total) by mouth 2 (two) times daily before a meal. (Patient not taking: Reported on 02/06/2021) 60 tablet 5   Current Facility-Administered Medications  Medication Dose Route Frequency Provider Last Rate Last Admin   0.9 %  sodium chloride infusion  500 mL Intravenous Once Thornton Park, MD        Allergies as of 03/09/2021   (No Known Allergies)    Family History  Problem Relation Age of Onset   Asthma Father    Cancer Sister 17       Pharyngeal   Colon cancer Neg Hx    Liver disease Neg Hx    Esophageal cancer Neg Hx    Rectal cancer Neg Hx    Stomach cancer Neg Hx       Physical Exam: General:   Alert, in NAD. No scleral icterus. No bilateral temporal wasting.  Heart:  Regular rate and rhythm; no murmurs Pulm: Clear anteriorly; no wheezing Abdomen:  Soft. Nontender. Nondistended. Normal bowel sounds. No rebound or guarding. No fluid wave. I am unable to reproduce his abdominal pain.  LAD: No inguinal or umbilical LAD Extremities:  Without edema. Neurologic:  Alert and  oriented x4;  grossly normal neurologically; no asterixis or clonus. Skin: No jaundice. No palmar erythema. Multiple spider angioma on the chest wall. Terry's nails.  Psych:  Alert and cooperative. Normal mood and affect.   Mechelle Pates L. Tarri Glenn, MD, MPH 03/09/2021, 10:09 AM

## 2021-03-09 NOTE — Progress Notes (Signed)
VS- [facility]

## 2021-03-09 NOTE — Patient Instructions (Signed)
Await Pathology Results. Resume previous diet and continue present medications. Repeat EGD in 2-3 years, earlier with new symptoms. Office follow-up in 2-3 months, earlier if needed.    YOU HAD AN ENDOSCOPIC PROCEDURE TODAY AT [facility]:   Refer to the procedure report that was given to you for any specific questions about what was found during the examination.  If the procedure report does not answer your questions, please call your gastroenterologist to clarify.  If you requested that your care partner not be given the details of your procedure findings, then the procedure report has been included in a sealed envelope for you to review at your convenience later.  YOU SHOULD EXPECT: Some feelings of bloating in the abdomen. Passage of more gas than usual.  Walking can help get rid of the air that was put into your GI tract during the procedure and reduce the bloating. If you had a lower endoscopy (such as a colonoscopy or flexible sigmoidoscopy) you may notice spotting of blood in your stool or on the toilet paper. If you underwent a bowel prep for your procedure, you may not have a normal bowel movement for a few days.  Please Note:  You might notice some irritation and congestion in your nose or some drainage.  This is from the oxygen used during your procedure.  There is no need for concern and it should clear up in a day or so.  SYMPTOMS TO REPORT IMMEDIATELY:  Following upper endoscopy (EGD)  Vomiting of blood or coffee ground material  New chest pain or pain under the shoulder blades  Painful or persistently difficult swallowing  New shortness of breath  Fever of 100F or higher  Black, tarry-looking stools  For urgent or emergent issues, a gastroenterologist can be reached at any hour by calling [phone]. Do not use MyChart messaging for urgent concerns.    DIET:  We do recommend a small meal at first, but then you may proceed to your regular diet.  Drink  plenty of fluids but you should avoid alcoholic beverages for 24 hours.  ACTIVITY:  You should plan to take it easy for the rest of today and you should NOT DRIVE or use heavy machinery until tomorrow (because of the sedation medicines used during the test).    FOLLOW UP: Our staff will call the number listed on your records 48-72 hours following your procedure to check on you and address any questions or concerns that you may have regarding the information given to you following your procedure. If we do not reach you, we will leave a message.  We will attempt to reach you two times.  During this call, we will ask if you have developed any symptoms of COVID 19. If you develop any symptoms (ie: fever, flu-like symptoms, shortness of breath, cough etc.) before then, please call [phone].  If you test positive for Covid 19 in the 2 weeks post procedure, please call and report this information to [place].    If any biopsies were taken you will be contacted by phone or by letter within the next 1-3 weeks.  Please call us at [phone] if you have not heard about the biopsies in 3 weeks.    SIGNATURES/CONFIDENTIALITY: You and/or your care partner have signed paperwork which will be entered into your electronic medical record.  These signatures attest to the fact that that the information above on your After Visit Summary has been reviewed and is understood.  Full responsibility of the confidentiality of this discharge information lies with you and/or your care-partner.

## 2021-03-09 NOTE — Telephone Encounter (Signed)
Pt came in to have ppw for FMLA filled out by pcp. Pt filled out intake form. Intake form and FMLA ppw placed in nurse's folder. Please call when available for pick up.  Cb#: [phone]

## 2021-03-09 NOTE — Progress Notes (Signed)
Called to room to assist during endoscopic procedure.  Patient ID and intended procedure confirmed with present staff. Received instructions for my participation in the procedure from the performing physician.  

## 2021-03-09 NOTE — Op Note (Signed)
[facility] Patient Name: Christopher Carrillo Procedure Date: 03/09/2021 10:03 AM MRN: [id number] Endoscopist: Thornton Park MD, MD Age: 50 Referring MD:  Date of Birth: 1971/01/18 Gender: Male Account #: [id number] Procedure:                Upper GI endoscopy Indications:              Portal hypertension rule out esophageal varices Medicines:                Monitored Anesthesia Care Procedure:                Pre-Anesthesia Assessment:                           - Prior to the procedure, a History and Physical                            was performed, and patient medications and                            allergies were reviewed. The patient's tolerance of                            previous anesthesia was also reviewed. The risks                            and benefits of the procedure and the sedation                            options and risks were discussed with the patient.                            All questions were answered, and informed consent                            was obtained. Prior Anticoagulants: The patient has                            taken no previous anticoagulant or antiplatelet                            agents. ASA Grade Assessment: III - A patient with                            severe systemic disease. After reviewing the risks                            and benefits, the patient was deemed in                            satisfactory condition to undergo the procedure.                           After obtaining informed consent, the endoscope was  passed under direct vision. Throughout the                            procedure, the patient's blood pressure, pulse, and                            oxygen saturations were monitored continuously. The                            Endoscope was introduced through the mouth, and                            advanced to the third part of duodenum. The upper                             GI endoscopy was accomplished without difficulty.                            The patient tolerated the procedure well. Scope In: Scope Out: Findings:                 The examined esophagus was normal. The z-line is                            located 42 cm from the incisors. No esophageal                            varices seen.                           Moderate portal hypertensive gastropathy was found                            in the gastric body. No gastric varices.                           Diffuse mild inflammation characterized by                            erythema, friability and granularity was found in                            the gastric antrum. Biopsies were taken from the                            antrum, body, and fundus with a cold forceps for                            histology. Estimated blood loss was minimal.                           Diffuse mildly erythematous mucosa without active                            bleeding and with  no stigmata of bleeding was found                            in the duodenal bulb and in the first portion of                            the duodenum. Biopsies were taken with a cold                            forceps for histology. Estimated blood loss was                            minimal.                           The cardia and gastric fundus were normal on                            retroflexion.                           The exam was otherwise without abnormality. Complications:            No immediate complications. Estimated blood loss:                            Minimal. Estimated Blood Loss:     Estimated blood loss was minimal. Impression:               - Normal esophagus.                           - Portal hypertensive gastropathy.                           - Gastritis. Biopsied.                           - Erythematous duodenopathy. Biopsied.                           - The examination was otherwise normal. Recommendation:            - Patient has a contact number available for                            emergencies. The signs and symptoms of potential                            delayed complications were discussed with the                            patient. Return to normal activities tomorrow.                            Written discharge instructions were provided to the  patient.                           - Resume previous diet.                           - Continue present medications.                           - Await pathology results.                           - Repeat EGD in 2-3 years, earlier with new                            symptoms.                           - Office follow-up in 2-3 months, earlier if needed. Thornton Park MD, MD 03/09/2021 10:30:41 AM This report has been signed electronically.

## 2021-03-09 NOTE — Progress Notes (Signed)
To pacu, VSS. Report to [email or web address].tb 

## 2021-03-09 NOTE — Telephone Encounter (Signed)
Spoke with pt and he states he is having about 3-4 panic attacks every week. He does seem to manage these for the most part. Pt reports he misses about 5 days of work per month due to the panic attacks. Pt will bring by FMLA paperwork to be completed.   FYI - thanks!

## 2021-03-13 ENCOUNTER — Telehealth: Payer: Self-pay | Admitting: *Deleted

## 2021-03-13 ENCOUNTER — Encounter: Payer: Self-pay | Admitting: Gastroenterology

## 2021-03-13 DIAGNOSIS — R69 Illness, unspecified: Secondary | ICD-10-CM | POA: Diagnosis not present

## 2021-03-13 NOTE — Telephone Encounter (Signed)
  Follow up Call-  Call back number 03/09/2021  Post procedure Call Back phone  # [phone]  Permission to leave phone message Yes  Some recent data might be hidden    [facility]

## 2021-03-13 NOTE — Telephone Encounter (Signed)
  Follow up Call-  Call back number 03/09/2021  Post procedure Call Back phone  # [phone]  Permission to leave phone message Yes  Some recent data might be hidden   LMOM to call back with any questions or concerns.  Also, call back if patient has developed fever, respiratory issues or been dx with COVID or had any family members or close contacts diagnosed since her procedure.

## 2021-03-15 NOTE — Telephone Encounter (Signed)
Forms completed and given to provider for signature.

## 2021-03-28 ENCOUNTER — Other Ambulatory Visit: Payer: Self-pay

## 2021-03-28 ENCOUNTER — Ambulatory Visit (INDEPENDENT_AMBULATORY_CARE_PROVIDER_SITE_OTHER): Payer: 59 | Admitting: Family Medicine

## 2021-03-28 ENCOUNTER — Encounter: Payer: Self-pay | Admitting: Family Medicine

## 2021-03-28 VITALS — BP 132/72 | HR 101 | Temp 98.2°F | Resp 18 | Ht 68.0 in | Wt 203.0 lb

## 2021-03-28 DIAGNOSIS — K7030 Alcoholic cirrhosis of liver without ascites: Secondary | ICD-10-CM | POA: Diagnosis not present

## 2021-03-28 DIAGNOSIS — R82998 Other abnormal findings in urine: Secondary | ICD-10-CM

## 2021-03-28 DIAGNOSIS — R69 Illness, unspecified: Secondary | ICD-10-CM | POA: Diagnosis not present

## 2021-03-28 DIAGNOSIS — K7682 Hepatic encephalopathy: Secondary | ICD-10-CM | POA: Diagnosis not present

## 2021-03-28 LAB — URINALYSIS, ROUTINE W REFLEX MICROSCOPIC
Bacteria, UA: NONE SEEN /HPF
Crystals: NONE SEEN /HPF
Glucose, UA: NEGATIVE
Ketones, ur: NEGATIVE
Leukocytes,Ua: NEGATIVE
Nitrite: NEGATIVE
RBC / HPF: NONE SEEN /HPF (ref 0–2)
Specific Gravity, Urine: 1.015 (ref 1.001–1.035)
WBC, UA: NONE SEEN /HPF (ref 0–5)
Yeast: NONE SEEN /HPF
pH: 6 (ref 5.0–8.0)

## 2021-03-28 LAB — MICROSCOPIC MESSAGE

## 2021-03-28 MED ORDER — RIFAXIMIN 550 MG PO TABS: 550.0000 mg | ORAL_TABLET | Freq: Two times a day (BID) | ORAL | 3 refills | Status: DC

## 2021-03-28 NOTE — Progress Notes (Signed)
Subjective:    Patient ID: Christopher Carrillo, male    DOB: Jan 08, 1971, 50 y.o.   MRN: [id number]  HPI The last time I saw the patient, I started him on Lexapro for anxiety.  The patient states that the Lexapro made the anxiety worse.  He was actually having suicidal ideation so he discontinued medication.  He started taking Zofran on his own for nausea and this helped panic attacks.  Unfortunately recently, he has noticed dark-colored urine.  He denies any dysuria or hematuria.  However he has developed jaundice.  Today on examination he has scleral icterus.  He has a history of alcoholic cirrhosis with hepatic encephalopathy.  He is currently on lactulose.  He states that he is taking the lactulose as prescribed and is having 3-5 bowel movements a day.  However she still feels somewhat sleepy and lethargic.  There is no asterixis today on examination.  He denies any tremor.  However he does report feeling tired and fatigued.  He has been taking Zofran several times a day for panic attacks which have helped but may be contributing some today sleepiness. Past Medical History:  Diagnosis Date   Abdominal pain    Alcoholic hepatitis without ascites    Anemia    Anxiety    Arthritis    Elevated bilirubin    Essential hypertension    Psoriasis    Transaminitis    Past Surgical History:  Procedure Laterality Date   BIOPSY  07/20/2019   Procedure: BIOPSY;  Surgeon: Daneil Dolin, MD;  [facility];  Service: Endoscopy;;   BIOPSY  08/14/2019   Procedure: BIOPSY;  Surgeon: Thornton Park, MD;  Location: [place];  Service: Gastroenterology;;   COLONOSCOPY     COLONOSCOPY WITH PROPOFOL N/A 07/20/2019   Procedure: COLONOSCOPY WITH PROPOFOL;  Surgeon: Daneil Dolin, MD;  [facility];  Service: Endoscopy;  Laterality: N/A;  10:15am   COLONOSCOPY WITH PROPOFOL N/A 08/14/2019   Procedure: COLONOSCOPY WITH PROPOFOL;  Surgeon: Thornton Park, MD;  Location: [place];  Service: Gastroenterology;  Laterality: N/A;   IR PARACENTESIS  08/14/2019   POLYPECTOMY  07/20/2019   Procedure: POLYPECTOMY;  Surgeon: Daneil Dolin, MD;  [facility];  Service: Endoscopy;;   SUBMUCOSAL TATTOO INJECTION  08/14/2019   Procedure: SUBMUCOSAL TATTOO INJECTION;  Surgeon: Thornton Park, MD;  Location: [place];  Service: Gastroenterology;;   Current Outpatient Medications on File Prior to Visit  Medication Sig Dispense Refill   ondansetron (ZOFRAN) 4 MG tablet Take by mouth.     Current Facility-Administered Medications on File Prior to Visit  Medication Dose Route Frequency Provider Last Rate Last Admin   0.9 %  sodium chloride infusion  500 mL Intravenous Once Thornton Park, MD       No Known Allergies Social History   Socioeconomic History   Marital status: Married    Spouse name: Not on file   Number of children: Not on file   Years of education: Not on file   Highest education level: Not on file  Occupational History   Not on file  Tobacco Use   Smoking status: Every Day    Packs/day: 0.33    Types: Cigarettes   Smokeless tobacco: Never  Vaping Use   Vaping Use: Never used  Substance and Sexual Activity   Alcohol use: Yes    Alcohol/week: 1.0 standard drink    Types: 1 Cans of beer per week  Comment: occasionally   Drug use: No   Sexual activity: Yes  Other Topics Concern   Not on file  Social History Narrative   Not on file   Social Determinants of Health   Financial Resource Strain: Not on file  Food Insecurity: Not on file  Transportation Needs: Not on file  Physical Activity: Not on file  Stress: Not on file  Social Connections: Not on file  Intimate Partner Violence: Not on file     Review of Systems     Objective:   Physical Exam Vitals reviewed.  Constitutional:      Appearance: Normal appearance. He is normal weight. He is not ill-appearing or toxic-appearing.  Eyes:     General:  Scleral icterus present.  Cardiovascular:     Rate and Rhythm: Normal rate and regular rhythm.     Heart sounds: Normal heart sounds. No murmur heard.   No friction rub. No gallop.  Pulmonary:     Effort: Pulmonary effort is normal. No respiratory distress.     Breath sounds: Normal breath sounds. No stridor. No wheezing, rhonchi or rales.  Abdominal:     General: Abdomen is flat. Bowel sounds are normal. There is distension.     Palpations: Abdomen is soft.     Tenderness: There is no abdominal tenderness. There is no guarding or rebound.  Skin:    Coloration: Skin is jaundiced.  Neurological:     Mental Status: He is alert.          Assessment & Plan:  Dark urine - Plan: Urinalysis, Routine w reflex microscopic, COMPLETE METABOLIC PANEL WITH GFR, CBC with Differential/Platelet  Hepatic encephalopathy - Plan: Ammonia  Alcoholic cirrhosis, unspecified whether ascites present [facility]) Recent lab work in October with reassuring.  However based on the patient's clinical appearance, I suspect his bilirubin will be eventually elevated.  He demonstrates jaundice as well as scleral icterus.  I am also concerned that his ammonia level will be elevated as he is more sleepy and lethargic.  Therefore, in addition to lactulose, I we will add rifaximin 550 mg twice daily to try to help stave off hepatic encephalopathy.  I recommended that he discontinue Zofran as I feel that this medicine is likely contributing to his sleepiness and lethargy.  I suspect the dark-colored urine is due to elevated bilirubin.  Obtain urinalysis, CMP, CBC, and ammonia level.  Discontinue all alcohol and sedating medication.  Recheck in 1 week or seek medical attention immediately if worsening.  In 1 week, if patient is doing better, consider adding buspirone for generalized anxiety disorder.

## 2021-03-29 LAB — COMPLETE METABOLIC PANEL WITH GFR
AG Ratio: 1.1 (calc) (ref 1.0–2.5)
ALT: 27 U/L (ref 9–46)
AST: 110 U/L — ABNORMAL HIGH (ref 10–40)
Albumin: 3.5 g/dL — ABNORMAL LOW (ref 3.6–5.1)
Alkaline phosphatase (APISO): 385 U/L — ABNORMAL HIGH (ref 36–130)
BUN/Creatinine Ratio: 9 (calc) (ref 6–22)
BUN: 5 mg/dL — ABNORMAL LOW (ref 7–25)
CO2: 20 mmol/L (ref 20–32)
Calcium: 8.2 mg/dL — ABNORMAL LOW (ref 8.6–10.3)
Chloride: 98 mmol/L (ref 98–110)
Creat: 0.56 mg/dL — ABNORMAL LOW (ref 0.60–1.29)
Globulin: 3.3 g/dL (calc) (ref 1.9–3.7)
Glucose, Bld: 196 mg/dL — ABNORMAL HIGH (ref 65–99)
Potassium: 3.5 mmol/L (ref 3.5–5.3)
Sodium: 133 mmol/L — ABNORMAL LOW (ref 135–146)
Total Bilirubin: 7.9 mg/dL — ABNORMAL HIGH (ref 0.2–1.2)
Total Protein: 6.8 g/dL (ref 6.1–8.1)
eGFR: 121 mL/min/{1.73_m2} (ref >=60)

## 2021-03-29 LAB — CBC WITH DIFFERENTIAL/PLATELET
Absolute Monocytes: 1166 cells/uL — ABNORMAL HIGH (ref 200–950)
Basophils Absolute: 103 cells/uL (ref 0–200)
Basophils Relative: 1.1 %
Eosinophils Absolute: 28 cells/uL (ref 15–500)
Eosinophils Relative: 0.3 %
HCT: 38.1 % — ABNORMAL LOW (ref 38.5–50.0)
Hemoglobin: 14.1 g/dL (ref 13.2–17.1)
Lymphs Abs: 1363 cells/uL (ref 850–3900)
MCH: 37.9 pg — ABNORMAL HIGH (ref 27.0–33.0)
MCHC: 37 g/dL — ABNORMAL HIGH (ref 32.0–36.0)
MCV: 102.4 fL — ABNORMAL HIGH (ref 80.0–100.0)
MPV: 11.8 fL (ref 7.5–12.5)
Monocytes Relative: 12.4 %
Neutro Abs: 6740 cells/uL (ref 1500–7800)
Neutrophils Relative %: 71.7 %
Platelets: 205 10*3/uL (ref 140–400)
RBC: 3.72 10*6/uL — ABNORMAL LOW (ref 4.20–5.80)
RDW: 15.4 % — ABNORMAL HIGH (ref 11.0–15.0)
Total Lymphocyte: 14.5 %
WBC: 9.4 10*3/uL (ref 3.8–10.8)

## 2021-03-29 LAB — AMMONIA: Ammonia: 100 umol/L — ABNORMAL HIGH (ref <=72)

## 2021-03-30 ENCOUNTER — Other Ambulatory Visit: Payer: 59

## 2021-03-30 ENCOUNTER — Other Ambulatory Visit: Payer: Self-pay

## 2021-03-30 DIAGNOSIS — R69 Illness, unspecified: Secondary | ICD-10-CM | POA: Diagnosis not present

## 2021-03-30 DIAGNOSIS — K7030 Alcoholic cirrhosis of liver without ascites: Secondary | ICD-10-CM

## 2021-03-31 LAB — COMPREHENSIVE METABOLIC PANEL
AG Ratio: 1.1 (calc) (ref 1.0–2.5)
ALT: 28 U/L (ref 9–46)
AST: 111 U/L — ABNORMAL HIGH (ref 10–40)
Albumin: 3.5 g/dL — ABNORMAL LOW (ref 3.6–5.1)
Alkaline phosphatase (APISO): 423 U/L — ABNORMAL HIGH (ref 36–130)
BUN/Creatinine Ratio: 5 (calc) — ABNORMAL LOW (ref 6–22)
BUN: 3 mg/dL — ABNORMAL LOW (ref 7–25)
CO2: 22 mmol/L (ref 20–32)
Calcium: 8.2 mg/dL — ABNORMAL LOW (ref 8.6–10.3)
Chloride: 101 mmol/L (ref 98–110)
Creat: 0.55 mg/dL — ABNORMAL LOW (ref 0.60–1.29)
Globulin: 3.1 g/dL (calc) (ref 1.9–3.7)
Glucose, Bld: 148 mg/dL — ABNORMAL HIGH (ref 65–99)
Potassium: 3.5 mmol/L (ref 3.5–5.3)
Sodium: 138 mmol/L (ref 135–146)
Total Bilirubin: 8.6 mg/dL — ABNORMAL HIGH (ref 0.2–1.2)
Total Protein: 6.6 g/dL (ref 6.1–8.1)

## 2021-03-31 MED ORDER — RIFAXIMIN 550 MG PO TABS: 550.0000 mg | ORAL_TABLET | Freq: Two times a day (BID) | ORAL | 2 refills | Status: DC

## 2021-03-31 NOTE — Addendum Note (Signed)
Addended by: Jaynie Crumble on: 03/31/2021 01:30 PM   Modules accepted: Orders

## 2021-04-03 ENCOUNTER — Other Ambulatory Visit: Payer: Self-pay

## 2021-04-03 ENCOUNTER — Encounter (HOSPITAL_COMMUNITY): Payer: Self-pay

## 2021-04-03 ENCOUNTER — Inpatient Hospital Stay (HOSPITAL_COMMUNITY)
Admission: EM | Admit: 2021-04-03 | Discharge: 2021-04-07 | DRG: 441 | Disposition: A | Discharge status: Home / Self Care | Payer: No Typology Code available for payment source | Attending: Family Medicine | Admitting: Family Medicine

## 2021-04-03 ENCOUNTER — Emergency Department (HOSPITAL_COMMUNITY): Payer: No Typology Code available for payment source

## 2021-04-03 DIAGNOSIS — K766 Portal hypertension: Secondary | ICD-10-CM | POA: Diagnosis present

## 2021-04-03 DIAGNOSIS — F1010 Alcohol abuse, uncomplicated: Secondary | ICD-10-CM | POA: Diagnosis present

## 2021-04-03 DIAGNOSIS — M1990 Unspecified osteoarthritis, unspecified site: Secondary | ICD-10-CM | POA: Diagnosis present

## 2021-04-03 DIAGNOSIS — L409 Psoriasis, unspecified: Secondary | ICD-10-CM | POA: Diagnosis present

## 2021-04-03 DIAGNOSIS — R109 Unspecified abdominal pain: Secondary | ICD-10-CM

## 2021-04-03 DIAGNOSIS — K2970 Gastritis, unspecified, without bleeding: Secondary | ICD-10-CM | POA: Diagnosis present

## 2021-04-03 DIAGNOSIS — R17 Unspecified jaundice: Secondary | ICD-10-CM | POA: Diagnosis not present

## 2021-04-03 DIAGNOSIS — Z79899 Other long term (current) drug therapy: Secondary | ICD-10-CM

## 2021-04-03 DIAGNOSIS — E669 Obesity, unspecified: Secondary | ICD-10-CM | POA: Diagnosis present

## 2021-04-03 DIAGNOSIS — Z6830 Body mass index (BMI) 30.0-30.9, adult: Secondary | ICD-10-CM

## 2021-04-03 DIAGNOSIS — R002 Palpitations: Secondary | ICD-10-CM | POA: Diagnosis not present

## 2021-04-03 DIAGNOSIS — R9431 Abnormal electrocardiogram [ECG] [EKG]: Secondary | ICD-10-CM | POA: Diagnosis not present

## 2021-04-03 DIAGNOSIS — K7011 Alcoholic hepatitis with ascites: Secondary | ICD-10-CM | POA: Diagnosis present

## 2021-04-03 DIAGNOSIS — K7010 Alcoholic hepatitis without ascites: Secondary | ICD-10-CM

## 2021-04-03 DIAGNOSIS — E440 Moderate protein-calorie malnutrition: Secondary | ICD-10-CM | POA: Diagnosis present

## 2021-04-03 DIAGNOSIS — K831 Obstruction of bile duct: Secondary | ICD-10-CM | POA: Diagnosis present

## 2021-04-03 DIAGNOSIS — K3189 Other diseases of stomach and duodenum: Secondary | ICD-10-CM | POA: Diagnosis present

## 2021-04-03 DIAGNOSIS — K7031 Alcoholic cirrhosis of liver with ascites: Secondary | ICD-10-CM | POA: Diagnosis present

## 2021-04-03 DIAGNOSIS — R509 Fever, unspecified: Secondary | ICD-10-CM | POA: Diagnosis not present

## 2021-04-03 DIAGNOSIS — N390 Urinary tract infection, site not specified: Secondary | ICD-10-CM | POA: Diagnosis not present

## 2021-04-03 DIAGNOSIS — U071 COVID-19: Secondary | ICD-10-CM | POA: Diagnosis not present

## 2021-04-03 DIAGNOSIS — K828 Other specified diseases of gallbladder: Secondary | ICD-10-CM | POA: Diagnosis present

## 2021-04-03 DIAGNOSIS — R69 Illness, unspecified: Secondary | ICD-10-CM | POA: Diagnosis not present

## 2021-04-03 DIAGNOSIS — Z720 Tobacco use: Secondary | ICD-10-CM | POA: Diagnosis present

## 2021-04-03 DIAGNOSIS — R319 Hematuria, unspecified: Secondary | ICD-10-CM | POA: Diagnosis not present

## 2021-04-03 DIAGNOSIS — I10 Essential (primary) hypertension: Secondary | ICD-10-CM | POA: Diagnosis present

## 2021-04-03 DIAGNOSIS — F17210 Nicotine dependence, cigarettes, uncomplicated: Secondary | ICD-10-CM | POA: Diagnosis present

## 2021-04-03 DIAGNOSIS — E871 Hypo-osmolality and hyponatremia: Secondary | ICD-10-CM | POA: Diagnosis present

## 2021-04-03 DIAGNOSIS — K760 Fatty (change of) liver, not elsewhere classified: Secondary | ICD-10-CM | POA: Diagnosis present

## 2021-04-03 DIAGNOSIS — E876 Hypokalemia: Secondary | ICD-10-CM | POA: Diagnosis present

## 2021-04-03 DIAGNOSIS — Z86010 Personal history of colonic polyps: Secondary | ICD-10-CM

## 2021-04-03 LAB — TROPONIN I (HIGH SENSITIVITY)
Troponin I (High Sensitivity): 8 ng/L (ref <18)
Troponin I (High Sensitivity): 8 ng/L (ref <18)

## 2021-04-03 LAB — BASIC METABOLIC PANEL
Anion gap: 10 (ref 5–15)
BUN: 8 mg/dL (ref 6–20)
CO2: 22 mmol/L (ref 22–32)
Calcium: 8.2 mg/dL — ABNORMAL LOW (ref 8.9–10.3)
Chloride: 97 mmol/L — ABNORMAL LOW (ref 98–111)
Creatinine, Ser: 0.47 mg/dL — ABNORMAL LOW (ref 0.61–1.24)
GFR, Estimated: >60 mL/min (ref >60)
Glucose, Bld: 103 mg/dL — ABNORMAL HIGH (ref 70–99)
Potassium: 3.3 mmol/L — ABNORMAL LOW (ref 3.5–5.1)
Sodium: 129 mmol/L — ABNORMAL LOW (ref 135–145)

## 2021-04-03 LAB — CBC
HCT: 39.7 % (ref 39.0–52.0)
Hemoglobin: 14.3 g/dL (ref 13.0–17.0)
MCH: 37.2 pg — ABNORMAL HIGH (ref 26.0–34.0)
MCHC: 36 g/dL (ref 30.0–36.0)
MCV: 103.4 fL — ABNORMAL HIGH (ref 80.0–100.0)
Platelets: 167 10*3/uL (ref 150–400)
RBC: 3.84 MIL/uL — ABNORMAL LOW (ref 4.22–5.81)
RDW: 18.8 % — ABNORMAL HIGH (ref 11.5–15.5)
WBC: 8.8 10*3/uL (ref 4.0–10.5)
nRBC: 0 % (ref 0.0–0.2)

## 2021-04-03 LAB — ETHANOL: Alcohol, Ethyl (B): <10 mg/dL (ref <10)

## 2021-04-03 LAB — HEPATIC FUNCTION PANEL
ALT: 36 U/L (ref 0–44)
AST: 229 U/L — ABNORMAL HIGH (ref 15–41)
Albumin: 2.9 g/dL — ABNORMAL LOW (ref 3.5–5.0)
Alkaline Phosphatase: 297 U/L — ABNORMAL HIGH (ref 38–126)
Bilirubin, Direct: 9.1 mg/dL — ABNORMAL HIGH (ref 0.0–0.2)
Indirect Bilirubin: 6.1 mg/dL — ABNORMAL HIGH (ref 0.3–0.9)
Total Bilirubin: 15.2 mg/dL — ABNORMAL HIGH (ref 0.3–1.2)
Total Protein: 7.3 g/dL (ref 6.5–8.1)

## 2021-04-03 LAB — URINALYSIS, ROUTINE W REFLEX MICROSCOPIC
Glucose, UA: 100 mg/dL — AB
Hgb urine dipstick: NEGATIVE
Ketones, ur: 15 mg/dL — AB
Nitrite: POSITIVE — AB
Protein, ur: 30 mg/dL — AB
Specific Gravity, Urine: 1.02 (ref 1.005–1.030)
pH: 6.5 (ref 5.0–8.0)

## 2021-04-03 LAB — URINALYSIS, MICROSCOPIC (REFLEX)

## 2021-04-03 LAB — AMMONIA: Ammonia: 71 umol/L — ABNORMAL HIGH (ref 9–35)

## 2021-04-03 LAB — RESP PANEL BY RT-PCR (FLU A&B, COVID) ARPGX2
Influenza A by PCR: NEGATIVE
Influenza B by PCR: NEGATIVE
SARS Coronavirus 2 by RT PCR: POSITIVE — AB

## 2021-04-03 LAB — LACTIC ACID, PLASMA: Lactic Acid, Venous: 1.1 mmol/L (ref 0.5–1.9)

## 2021-04-03 LAB — PROTIME-INR
INR: 1.3 — ABNORMAL HIGH (ref 0.8–1.2)
Prothrombin Time: 16 seconds — ABNORMAL HIGH (ref 11.4–15.2)

## 2021-04-03 MED ORDER — SODIUM CHLORIDE 0.9 % IV SOLN
1.0000 g | Freq: Once | INTRAVENOUS | Status: AC
  Administered 2021-04-03: 1 g via INTRAVENOUS
  Filled 2021-04-03: qty 10

## 2021-04-03 NOTE — ED Notes (Signed)
Took patient care, not complaining of any pain at this time, a &o x4

## 2021-04-03 NOTE — ED Triage Notes (Signed)
Pt to ED by POV from home with c/o jaundice, palpations & blood in his urine which all began approx 2 weeks ago. Pt states this has happened twice previously following an endo/colonoscopy and that he just had one 2 weeks ago. Pt arrives with yellow appearance to skin and eyes consistent with stated jaundice. VSS.

## 2021-04-03 NOTE — ED Provider Notes (Signed)
[facility] EMERGENCY DEPARTMENT Provider Note   CSN: [id number] Arrival date & time: 04/03/21  1948     History Chief Complaint  Patient presents with   Jaundice   Palpitations   Hematuria    Christopher Carrillo is a 50 y.o. male.   Palpitations Associated symptoms: no back pain, no shortness of breath and no weakness   Hematuria Associated symptoms include abdominal pain. Pertinent negatives include no shortness of breath. Patient gets feeling bad.  Has had some abdominal pain jaundiced and history of cirrhosis.  Has had dark urine.  History of alcoholic hepatitis.  States has been a month without drinking.  Had a colonoscopy about 2 weeks ago.  States that when he has had sedation like this in the past he will get jaundice after it.  Does have a fever.  Has had increased diarrhea.  States he had stool over himself because it came on so quickly.  Does have some frequent diarrhea however.  No known sick contacts.  No cough.  States he feels as if abdomen is more swollen than before.     Past Medical History:  Diagnosis Date   Abdominal pain    Alcoholic hepatitis without ascites    Anemia    Anxiety    Arthritis    Elevated bilirubin    Essential hypertension    Psoriasis    Transaminitis     Patient Active Problem List   Diagnosis Date Noted   Overweight (BMI 25.0-29.9) 09/28/2020   Nausea & vomiting 09/23/2020   GI bleed 09/23/2020   Hyponatremia 09/23/2020   Hypokalemia 09/23/2020   Dehydration 09/23/2020   AKI (acute kidney injury) ([place]) 09/23/2020   Acute diarrhea 32/54/9826   Alcoholic cirrhosis ([place]) 41/58/3094   Encephalopathy, portal systemic 08/09/2020   Polyarthralgia 01/08/2020   History of diverticulosis 10/28/2019   Internal and external bleeding hemorrhoids 08/20/2019   Colon ulcer    Hematochezia 08/12/2019   Leukocytosis 08/12/2019   Coagulopathy ([place])    Lower extremity edema 07/29/2019   Urinary hesitancy 07/29/2019   Anemia of chronic  disease 07/29/2019   Drug-induced hepatic toxicity 07/25/2019   Elevated bilirubin    Elevated LFTs 07/14/2019   Hepatic steatosis 02/18/2019   GAD (generalized anxiety disorder) 02/18/2019   GERD (gastroesophageal reflux disease) 04/08/2013   Abdominal pain 04/07/2013   Hyperlipidemia 12/19/2012   Essential hypertension 12/19/2012   Routine general medical examination at a health care facility 08/07/2012   Psoriatic arthritis ([place]) 05/04/2012   Obese 05/04/2012   Tobacco use 05/04/2012    Past Surgical History:  Procedure Laterality Date   BIOPSY  07/20/2019   Procedure: BIOPSY;  Surgeon: Daneil Dolin, MD;  [facility];  Service: Endoscopy;;   BIOPSY  08/14/2019   Procedure: BIOPSY;  Surgeon: Thornton Park, MD;  Location: [place];  Service: Gastroenterology;;   COLONOSCOPY     COLONOSCOPY WITH PROPOFOL N/A 07/20/2019   Procedure: COLONOSCOPY WITH PROPOFOL;  Surgeon: Daneil Dolin, MD;  Location[facility];  Service: Endoscopy;  Laterality: N/A;  10:15am   COLONOSCOPY WITH PROPOFOL N/A 08/14/2019   Procedure: COLONOSCOPY WITH PROPOFOL;  Surgeon: Thornton Park, MD;  Location: [place];  Service: Gastroenterology;  Laterality: N/A;   IR PARACENTESIS  08/14/2019   POLYPECTOMY  07/20/2019   Procedure: POLYPECTOMY;  Surgeon: Daneil Dolin, MD;  Location: [facility];  Service: Endoscopy;;   SUBMUCOSAL TATTOO INJECTION  08/14/2019   Procedure: SUBMUCOSAL TATTOO INJECTION;  Surgeon: Tarri Glenn,  Joelene Millin, MD;  Location[facility] END[facility]COPY;  Service: Gastroenterology;;       Family History  Problem Relation Age of Onset   Asthma Father    Cancer Sister 60       Pharyngeal   Colon cancer Neg Hx    Liver disease Neg Hx    Esophageal cancer Neg Hx    Rectal cancer Neg Hx    Stomach cancer Neg Hx     Social History   Tobacco Use   Smoking status: Every Day    Packs/day: 0.33    Types: Cigarettes   Smokeless tobacco: Never  Vaping Use    Vaping Use: Never used  Substance Use Topics   Alcohol use: Yes    Alcohol/week: 1.0 standard drink    Types: 1 Cans of beer per week    Comment: occasionally   Drug use: No    Home Medications Prior to Admission medications   Medication Sig Start Date End Date Taking? Authorizing Provider  ondansetron (ZOFRAN) 4 MG tablet Take by mouth. 03/15/21   [provider]  rifaximin (XIFAXAN) 550 MG TABS tablet Take 1 tablet (550 mg total) by mouth 2 (two) times daily. 03/31/21   Susy Frizzle, MD    Allergies    Patient has no known allergies.  Review of Systems   Review of Systems  Constitutional:  Positive for appetite change and fever.  HENT:  Negative for congestion.   Respiratory:  Negative for shortness of breath.   Cardiovascular:  Positive for palpitations.  Gastrointestinal:  Positive for abdominal pain and diarrhea.  Genitourinary:  Positive for hematuria.  Musculoskeletal:  Negative for back pain.  Skin:  Positive for color change. Negative for rash.  Neurological:  Negative for weakness.  Psychiatric/Behavioral:  Negative for confusion.    Physical Exam Updated Vital Signs BP 139/65    Pulse (!) 105    Temp (!) 101.8 F (38.8 C) (Oral)    Resp 18    Ht 5' 8"  (1.727 m)    Wt 88.5 kg    SpO2 100%    BMI 29.65 kg/m   Physical Exam Vitals and nursing note reviewed.  HENT:     Head: Normocephalic.  Eyes:     General: Scleral icterus present.     Pupils: Pupils are equal, round, and reactive to light.  Cardiovascular:     Rate and Rhythm: Regular rhythm.  Abdominal:     Comments: Hepatomegaly.  Mild abdominal tenderness.  Musculoskeletal:     Cervical back: Neck supple.     Right lower leg: No edema.     Left lower leg: No edema.  Skin:    General: Skin is warm.     Capillary Refill: Capillary refill takes less than 2 seconds.     Coloration: Skin is jaundiced.  Neurological:     Mental Status: He is alert and oriented to person, place, and time.     ED Results / Procedures / Treatments   Labs (all labs ordered are listed, but only abnormal results are displayed) Labs Reviewed  BASIC METABOLIC PANEL - Abnormal; Notable for the following components:      Result Value   Sodium 129 (*)    Potassium 3.3 (*)    Chloride 97 (*)    Glucose, Bld 103 (*)    Creatinine, Ser 0.47 (*)    Calcium 8.2 (*)    All other components within normal limits  CBC - Abnormal; Notable for the  following components:   RBC 3.84 (*)    MCV 103.4 (*)    MCH 37.2 (*)    RDW 18.8 (*)    All other components within normal limits  URINALYSIS, ROUTINE W REFLEX MICROSCOPIC - Abnormal; Notable for the following components:   Color, Urine AMBER (*)    Glucose, UA 100 (*)    Bilirubin Urine LARGE (*)    Ketones, ur 15 (*)    Protein, ur 30 (*)    Nitrite POSITIVE (*)    Leukocytes,Ua TRACE (*)    All other components within normal limits  HEPATIC FUNCTION PANEL - Abnormal; Notable for the following components:   Albumin 2.9 (*)    AST 229 (*)    Alkaline Phosphatase 297 (*)    Total Bilirubin 15.2 (*)    Bilirubin, Direct 9.1 (*)    Indirect Bilirubin 6.1 (*)    All other components within normal limits  AMMONIA - Abnormal; Notable for the following components:   Ammonia 71 (*)    All other components within normal limits  PROTIME-INR - Abnormal; Notable for the following components:   Prothrombin Time 16.0 (*)    INR 1.3 (*)    All other components within normal limits  URINALYSIS, MICROSCOPIC (REFLEX) - Abnormal; Notable for the following components:   Bacteria, UA MANY (*)    All other components within normal limits  CULTURE, BLOOD (ROUTINE X 2)  CULTURE, BLOOD (ROUTINE X 2)  RESP PANEL BY RT-PCR (FLU A&B, COVID) ARPGX2  URINE CULTURE  LACTIC ACID, PLASMA  ETHANOL  LACTIC ACID, PLASMA  TROPONIN I (HIGH SENSITIVITY)  TROPONIN I (HIGH SENSITIVITY)    EKG None  Radiology DG Chest Portable 1 View  Result Date: 04/03/2021 CLINICAL  DATA:  Fever, jaundice, palpitations and hematuria EXAM: PORTABLE CHEST 1 VIEW COMPARISON:  Aug 13, 2019. FINDINGS: The heart size and mediastinal contours are within normal limits. No focal consolidation. No visible pleural effusion or pneumothorax. The visualized skeletal structures are unremarkable. IMPRESSION: No active disease. Electronically Signed   By: Dahlia Bailiff M.D.   On: 04/03/2021 22:24    Procedures Procedures   Medications Ordered in ED Medications  cefTRIAXone (ROCEPHIN) 1 g in sodium chloride 0.9 % 100 mL IVPB (1 g Intravenous New Bag/Given 04/03/21 2330)    ED Course  I have reviewed the triage vital signs and the nursing notes.  Pertinent labs & imaging results that were available during my care of the patient were reviewed by me and considered in my medical decision making (see chart for details).    MDM Rules/Calculators/A&P                         Patient presents with worsening jaundice.  Diarrhea.  Dark urine.  History of known alcoholic cirrhosis.  However does have fever.  With the dark urine this potentially UTI.  Also was having frequent diarrhea.  This also was potential cause of the fever.  Has had previous infectious diarrhea although this was after a trip to [place].  Mild abdominal tenderness.  White count reassuring.  Ammonia elevated.  LFTs elevated including a bilirubin of 15.  Urine culture sent and started on Rocephin.  Will admit to hospital for fever and worsening hepatic function    Final Clinical Impression(s) / ED Diagnoses Final diagnoses:  Lower urinary tract infectious disease  Alcoholic hepatitis, unspecified whether ascites present    Rx / DC Orders ED Discharge  Orders     None        Davonna Belling, MD 04/03/21 [phone]

## 2021-04-04 ENCOUNTER — Inpatient Hospital Stay (HOSPITAL_COMMUNITY): Payer: No Typology Code available for payment source

## 2021-04-04 ENCOUNTER — Encounter (HOSPITAL_COMMUNITY): Payer: Self-pay | Admitting: Family Medicine

## 2021-04-04 DIAGNOSIS — K7460 Unspecified cirrhosis of liver: Secondary | ICD-10-CM | POA: Diagnosis not present

## 2021-04-04 DIAGNOSIS — K828 Other specified diseases of gallbladder: Secondary | ICD-10-CM | POA: Diagnosis not present

## 2021-04-04 DIAGNOSIS — K3189 Other diseases of stomach and duodenum: Secondary | ICD-10-CM | POA: Diagnosis present

## 2021-04-04 DIAGNOSIS — F17210 Nicotine dependence, cigarettes, uncomplicated: Secondary | ICD-10-CM | POA: Diagnosis present

## 2021-04-04 DIAGNOSIS — R17 Unspecified jaundice: Secondary | ICD-10-CM | POA: Diagnosis not present

## 2021-04-04 DIAGNOSIS — R1012 Left upper quadrant pain: Secondary | ICD-10-CM | POA: Diagnosis not present

## 2021-04-04 DIAGNOSIS — E440 Moderate protein-calorie malnutrition: Secondary | ICD-10-CM | POA: Diagnosis not present

## 2021-04-04 DIAGNOSIS — K7010 Alcoholic hepatitis without ascites: Secondary | ICD-10-CM | POA: Diagnosis not present

## 2021-04-04 DIAGNOSIS — K766 Portal hypertension: Secondary | ICD-10-CM | POA: Diagnosis not present

## 2021-04-04 DIAGNOSIS — K2970 Gastritis, unspecified, without bleeding: Secondary | ICD-10-CM | POA: Diagnosis present

## 2021-04-04 DIAGNOSIS — U071 COVID-19: Secondary | ICD-10-CM | POA: Diagnosis not present

## 2021-04-04 DIAGNOSIS — Z6830 Body mass index (BMI) 30.0-30.9, adult: Secondary | ICD-10-CM | POA: Diagnosis not present

## 2021-04-04 DIAGNOSIS — N390 Urinary tract infection, site not specified: Secondary | ICD-10-CM | POA: Diagnosis present

## 2021-04-04 DIAGNOSIS — R69 Illness, unspecified: Secondary | ICD-10-CM | POA: Diagnosis not present

## 2021-04-04 DIAGNOSIS — E876 Hypokalemia: Secondary | ICD-10-CM | POA: Diagnosis present

## 2021-04-04 DIAGNOSIS — E871 Hypo-osmolality and hyponatremia: Secondary | ICD-10-CM | POA: Diagnosis not present

## 2021-04-04 DIAGNOSIS — K831 Obstruction of bile duct: Secondary | ICD-10-CM | POA: Diagnosis not present

## 2021-04-04 DIAGNOSIS — K7031 Alcoholic cirrhosis of liver with ascites: Secondary | ICD-10-CM | POA: Diagnosis present

## 2021-04-04 DIAGNOSIS — E669 Obesity, unspecified: Secondary | ICD-10-CM | POA: Diagnosis present

## 2021-04-04 DIAGNOSIS — I10 Essential (primary) hypertension: Secondary | ICD-10-CM | POA: Diagnosis not present

## 2021-04-04 DIAGNOSIS — F1010 Alcohol abuse, uncomplicated: Secondary | ICD-10-CM | POA: Diagnosis present

## 2021-04-04 DIAGNOSIS — K760 Fatty (change of) liver, not elsewhere classified: Secondary | ICD-10-CM | POA: Diagnosis present

## 2021-04-04 LAB — CBC
HCT: 35.5 % — ABNORMAL LOW (ref 39.0–52.0)
Hemoglobin: 12.9 g/dL — ABNORMAL LOW (ref 13.0–17.0)
MCH: 37.4 pg — ABNORMAL HIGH (ref 26.0–34.0)
MCHC: 36.3 g/dL — ABNORMAL HIGH (ref 30.0–36.0)
MCV: 102.9 fL — ABNORMAL HIGH (ref 80.0–100.0)
Platelets: 144 10*3/uL — ABNORMAL LOW (ref 150–400)
RBC: 3.45 MIL/uL — ABNORMAL LOW (ref 4.22–5.81)
RDW: 18.7 % — ABNORMAL HIGH (ref 11.5–15.5)
WBC: 7.5 10*3/uL (ref 4.0–10.5)
nRBC: 0 % (ref 0.0–0.2)

## 2021-04-04 LAB — HEPATITIS PANEL, ACUTE
HCV Ab: NONREACTIVE
Hep A IgM: NONREACTIVE
Hep B C IgM: NONREACTIVE
Hepatitis B Surface Ag: NONREACTIVE

## 2021-04-04 LAB — COMPREHENSIVE METABOLIC PANEL
ALT: 31 U/L (ref 0–44)
AST: 197 U/L — ABNORMAL HIGH (ref 15–41)
Albumin: 2.5 g/dL — ABNORMAL LOW (ref 3.5–5.0)
Alkaline Phosphatase: 245 U/L — ABNORMAL HIGH (ref 38–126)
Anion gap: 9 (ref 5–15)
BUN: 8 mg/dL (ref 6–20)
CO2: 20 mmol/L — ABNORMAL LOW (ref 22–32)
Calcium: 7.7 mg/dL — ABNORMAL LOW (ref 8.9–10.3)
Chloride: 100 mmol/L (ref 98–111)
Creatinine, Ser: 0.43 mg/dL — ABNORMAL LOW (ref 0.61–1.24)
GFR, Estimated: >60 mL/min (ref >60)
Glucose, Bld: 100 mg/dL — ABNORMAL HIGH (ref 70–99)
Potassium: 3.6 mmol/L (ref 3.5–5.1)
Sodium: 129 mmol/L — ABNORMAL LOW (ref 135–145)
Total Bilirubin: 13.1 mg/dL — ABNORMAL HIGH (ref 0.3–1.2)
Total Protein: 6.1 g/dL — ABNORMAL LOW (ref 6.5–8.1)

## 2021-04-04 LAB — MAGNESIUM: Magnesium: 1.7 mg/dL (ref 1.7–2.4)

## 2021-04-04 LAB — LACTIC ACID, PLASMA: Lactic Acid, Venous: 1.3 mmol/L (ref 0.5–1.9)

## 2021-04-04 LAB — PROTIME-INR
INR: 1.4 — ABNORMAL HIGH (ref 0.8–1.2)
Prothrombin Time: 16.8 seconds — ABNORMAL HIGH (ref 11.4–15.2)

## 2021-04-04 MED ORDER — POTASSIUM CHLORIDE 10 MEQ/100ML IV SOLN
10.0000 meq | INTRAVENOUS | Status: AC
  Administered 2021-04-04 (×4): 10 meq via INTRAVENOUS
  Filled 2021-04-04 (×4): qty 100

## 2021-04-04 MED ORDER — GADOBUTROL 1 MMOL/ML IV SOLN
9.0000 mL | Freq: Once | INTRAVENOUS | Status: AC | PRN
  Administered 2021-04-04: 18:00:00 9 mL via INTRAVENOUS

## 2021-04-04 MED ORDER — ONDANSETRON HCL 4 MG/2ML IJ SOLN: 4.0000 mg | Freq: Four times a day (QID) | INTRAMUSCULAR | Status: DC | PRN

## 2021-04-04 MED ORDER — RIFAXIMIN 550 MG PO TABS: 550.0000 mg | ORAL_TABLET | Freq: Two times a day (BID) | ORAL | 2 refills | Status: DC

## 2021-04-04 MED ORDER — ENSURE ENLIVE PO LIQD
237.0000 mL | Freq: Two times a day (BID) | ORAL | Status: DC
  Administered 2021-04-05 (×2): 237 mL via ORAL

## 2021-04-04 MED ORDER — RIFAXIMIN 550 MG PO TABS
550.0000 mg | ORAL_TABLET | Freq: Two times a day (BID) | ORAL | Status: DC
  Administered 2021-04-04 – 2021-04-07 (×8): 550 mg via ORAL
  Filled 2021-04-04 (×8): qty 1

## 2021-04-04 MED ORDER — ONDANSETRON HCL 4 MG PO TABS: 4.0000 mg | ORAL_TABLET | Freq: Four times a day (QID) | ORAL | Status: DC | PRN

## 2021-04-04 MED ORDER — LORAZEPAM 2 MG/ML IJ SOLN
1.0000 mg | Freq: Once | INTRAMUSCULAR | Status: AC | PRN
  Administered 2021-04-04: 18:00:00 1 mg via INTRAVENOUS
  Filled 2021-04-04: qty 1

## 2021-04-04 MED ORDER — IBUPROFEN 400 MG PO TABS
400.0000 mg | ORAL_TABLET | Freq: Four times a day (QID) | ORAL | Status: DC | PRN
  Administered 2021-04-04 – 2021-04-05 (×4): 400 mg via ORAL
  Filled 2021-04-04 (×4): qty 1

## 2021-04-04 MED ORDER — ENSURE MAX PROTEIN PO LIQD
11.0000 [oz_av] | Freq: Every day | ORAL | Status: DC
  Administered 2021-04-05: 10:00:00 11 [oz_av] via ORAL

## 2021-04-04 MED ORDER — SODIUM CHLORIDE 0.9 % IV SOLN
1.0000 g | INTRAVENOUS | Status: AC
  Administered 2021-04-04 – 2021-04-05 (×2): 1 g via INTRAVENOUS
  Filled 2021-04-04 (×2): qty 10

## 2021-04-04 MED ORDER — LACTULOSE 10 GM/15ML PO SOLN
10.0000 g | Freq: Two times a day (BID) | ORAL | Status: DC
  Administered 2021-04-04 – 2021-04-05 (×2): 10 g via ORAL
  Filled 2021-04-04 (×2): qty 30

## 2021-04-04 MED ORDER — MORPHINE SULFATE (PF) 2 MG/ML IV SOLN
2.0000 mg | INTRAVENOUS | Status: DC | PRN
  Administered 2021-04-04 – 2021-04-06 (×21): 2 mg via INTRAVENOUS
  Filled 2021-04-04 (×22): qty 1

## 2021-04-04 MED ORDER — SODIUM CHLORIDE 0.9 % IV SOLN: INTRAVENOUS | Status: DC

## 2021-04-04 MED ORDER — HEPARIN SODIUM (PORCINE) 5000 UNIT/ML IJ SOLN
5000.0000 [IU] | Freq: Three times a day (TID) | INTRAMUSCULAR | Status: DC
  Administered 2021-04-04 – 2021-04-07 (×10): 5000 [IU] via SUBCUTANEOUS
  Filled 2021-04-04 (×10): qty 1

## 2021-04-04 NOTE — H&P (Signed)
TRH H&P    Patient Demographics:    Christopher Carrillo, is a 50 y.o. male  MRN: [id number]  DOB - 23-Jan-1971  Admit Date - 04/03/2021  Referring MD/NP/PA: Alvino Chapel  Outpatient Primary MD for the patient is Susy Frizzle, MD  Patient coming from: Home  Chief complaint- Jaundice and abdominal pain   HPI:    Christopher Carrillo  is a 50 y.o. male, with history of alcoholic hepatitis, anxiety, essential hypertension, psoriasis, and more presents the ED with a chief complaint of stomach pain and jaundice.  Patient reports that started approximately 1 week ago.  1 week prior to that he had had a colonoscopy with conscious sedation.  Patient reports that this would be the third time he has become jaundice after having anesthesia.  Patient reports that been getting worse since it started.  Today his urine became dark.  He describes it as a color between coffee and tea.  He had some left lower quadrant pain.  He reports loose stools but he does take lactulose.  He tries to take his lactulose every day but does not always get to as he believes it interferes with his outings.  The last time he took it was the day prior to presentation.  Patient reports he started noticing his jaundice 4-5 days ago.  Been gradually worse.  Patient does still have his gallbladder.  Patient describes his abdominal pain as a dull ache.  Laying on that side or palpating it makes it more like a sharp pain.  Patient reports its been 1 month since he had alcohol.  He reports being fevers but no chills.  He did have a fever in the ED.  Patient does have some slight dysuria as well.  He tries to drink 65-100 ounces of water per day but reports that recently it might of been a little decreased.  Patient is a current smoker one third of a pack per day.  He has not had a drink in a month.  He does not use illicit drugs.  Patient is full code.  In the  ED Patient is febrile 101.8, heart rate 104-109, respiratory rate 18, blood pressure 132/67, satting 100% No leukocytosis with white blood cell count of 8.8, hemoglobin 14.3 Potassium 3.3, sodium 129, alk phos 297, albumin 2.9, AST 229, T bili 15.2, ammonia 71 Chest x-ray shows no active disease COVID-positive Blood cultures pending Alcohol level less than 10 UA suspicious for UTI Urine culture pending Admission requested for further evaluation and treatment of jaundice   Review of systems:    In addition to the HPI above,  Admits to fever No Headache, No changes with Vision or hearing, No problems swallowing food or Liquids, No Chest pain, Cough or Shortness of Breath, Admits to abdominal pain and loose stool, No Blood in stool or Urine, No dysuria, No new skin rashes or bruises, No new joints pains-aches,  No new weakness, tingling, numbness in any extremity, No recent weight gain or loss, No polyuria, polydypsia or polyphagia, No significant Mental Stressors.  All other systems reviewed and are negative.    Past History of the following :    Past Medical History:  Diagnosis Date   Abdominal pain    Alcoholic hepatitis without ascites    Anemia    Anxiety    Arthritis    Elevated bilirubin    Essential hypertension    Psoriasis    Transaminitis       Past Surgical History:  Procedure Laterality Date   BIOPSY  07/20/2019   Procedure: BIOPSY;  Surgeon: Daneil Dolin, MD;  [facility];  Service: Endoscopy;;   BIOPSY  08/14/2019   Procedure: BIOPSY;  Surgeon: Thornton Park, MD;  Location: [place];  Service: Gastroenterology;;   COLONOSCOPY     COLONOSCOPY WITH PROPOFOL N/A 07/20/2019   Procedure: COLONOSCOPY WITH PROPOFOL;  Surgeon: Daneil Dolin, MD;  [facility];  Service: Endoscopy;  Laterality: N/A;  10:15am   COLONOSCOPY WITH PROPOFOL N/A 08/14/2019   Procedure: COLONOSCOPY WITH PROPOFOL;  Surgeon: Thornton Park,  MD;  Location: [place];  Service: Gastroenterology;  Laterality: N/A;   IR PARACENTESIS  08/14/2019   POLYPECTOMY  07/20/2019   Procedure: POLYPECTOMY;  Surgeon: Daneil Dolin, MD;  [facility];  Service: Endoscopy;;   SUBMUCOSAL TATTOO INJECTION  08/14/2019   Procedure: SUBMUCOSAL TATTOO INJECTION;  Surgeon: Thornton Park, MD;  Location: [facility];  Service: Gastroenterology;;      Social History:      Social History   Tobacco Use   Smoking status: Every Day    Packs/day: 0.33    Types: Cigarettes   Smokeless tobacco: Never  Substance Use Topics   Alcohol use: Yes    Alcohol/week: 1.0 standard drink    Types: 1 Cans of beer per week    Comment: occasionally       Family History :     Family History  Problem Relation Age of Onset   Asthma Father    Cancer Sister 49       Pharyngeal   Colon cancer Neg Hx    Liver disease Neg Hx    Esophageal cancer Neg Hx    Rectal cancer Neg Hx    Stomach cancer Neg Hx       Home Medications:   Prior to Admission medications   Medication Sig Start Date End Date Taking? Authorizing Provider  ondansetron (ZOFRAN) 4 MG tablet Take by mouth. 03/15/21   [provider]  rifaximin (XIFAXAN) 550 MG TABS tablet Take 1 tablet (550 mg total) by mouth 2 (two) times daily. 03/31/21   Susy Frizzle, MD     Allergies:    No Known Allergies   Physical Exam:   Vitals  Blood pressure 113/67, pulse 96, temperature 98.7 F (37.1 C), temperature source Oral, resp. rate 16, height 5' 8"  (1.727 m), weight 91.2 kg, SpO2 97 %.   1.  General: Patient lying supine in bed,  no acute distress   2. Psychiatric: Alert and oriented x 3, mood and behavior normal for situation, pleasant and cooperative with exam   3. Neurologic: Speech and language are normal, face is symmetric, moves all 4 extremities voluntarily, at baseline without acute deficits on limited exam   4. HEENMT:  Head is atraumatic,  normocephalic, pupils reactive to light, neck is supple, trachea is midline, mucous membranes are moist   5. Respiratory : Lungs are clear to auscultation bilaterally without wheezing, rhonchi, rales, no cyanosis, no increase in  work of breathing or accessory muscle use   6. Cardiovascular : Heart rate normal, rhythm is regular, no murmurs, rubs or gallops, no peripheral edema, peripheral pulses palpated   7. Gastrointestinal:  Abdomen is soft, minimally distended, ender to the palpation in the left lower quadrant just lateral to midline, bowel sounds active, no masses, hepatomegaly present   8. Skin:  Skin is warm, dry and intact, but jaundiced with scattered spider angiomas   9.Musculoskeletal:  No acute deformities or trauma, no asymmetry in tone, no peripheral edema, peripheral pulses palpated, no tenderness to palpation in the extremities     Data Review:    CBC Recent Labs  Lab 03/28/21 1649 04/03/21 2120  WBC 9.4 8.8  HGB 14.1 14.3  HCT 38.1* 39.7  PLT 205 167  MCV 102.4* 103.4*  MCH 37.9* 37.2*  MCHC 37.0* 36.0  RDW 15.4* 18.8*  LYMPHSABS 1,363  --   EOSABS 28  --   BASOSABS 103  --    ------------------------------------------------------------------------------------------------------------------  Results for orders placed or performed during the hospital encounter of 04/03/21 (from the past 48 hour(s))  Basic metabolic panel     Status: Abnormal   Collection Time: 04/03/21  9:20 PM  Result Value Ref Range   Sodium 129 (L) 135 - 145 mmol/L   Potassium 3.3 (L) 3.5 - 5.1 mmol/L   Chloride 97 (L) 98 - 111 mmol/L   CO2 22 22 - 32 mmol/L   Glucose, Bld 103 (H) 70 - 99 mg/dL    Comment: Glucose reference range applies only to samples taken after fasting for at least 8 hours.   BUN 8 6 - 20 mg/dL   Creatinine, Ser 0.47 (L) 0.61 - 1.24 mg/dL   Calcium 8.2 (L) 8.9 - 10.3 mg/dL   GFR, Estimated >60 >60 mL/min    Comment: (NOTE) Calculated using the CKD-EPI  Creatinine Equation (2021)    Anion gap 10 5 - 15    Comment: Performed at [facility], [address], [place], [place] [address]  CBC     Status: Abnormal   Collection Time: 04/03/21  9:20 PM  Result Value Ref Range   WBC 8.8 4.0 - 10.5 K/uL   RBC 3.84 (L) 4.22 - 5.81 MIL/uL   Hemoglobin 14.3 13.0 - 17.0 g/dL   HCT 39.7 39.0 - 52.0 %   MCV 103.4 (H) 80.0 - 100.0 fL   MCH 37.2 (H) 26.0 - 34.0 pg   MCHC 36.0 30.0 - 36.0 g/dL   RDW 18.8 (H) 11.5 - 15.5 %   Platelets 167 150 - 400 K/uL   nRBC 0.0 0.0 - 0.2 %    Comment: Performed at [facility], [address] [place], [place] [address]  Troponin I (High Sensitivity)     Status: None   Collection Time: 04/03/21  9:20 PM  Result Value Ref Range   Troponin I (High Sensitivity) 8 <18 ng/L    Comment: (NOTE) Elevated high sensitivity troponin I (hsTnI) values and significant  changes across serial measurements may suggest ACS but many other  chronic and acute conditions are known to elevate hsTnI results.  Refer to the "Links" section for chest pain algorithms and additional  guidance. Performed at [facility], [address], [place], [place] [address]   Hepatic function panel     Status: Abnormal   Collection Time: 04/03/21  9:20 PM  Result Value Ref Range   Total Protein 7.3 6.5 - 8.1 g/dL   Albumin 2.9 (  L) 3.5 - 5.0 g/dL   AST 229 (H) 15 - 41 U/L   ALT 36 0 - 44 U/L   Alkaline Phosphatase 297 (H) 38 - 126 U/L   Total Bilirubin 15.2 (H) 0.3 - 1.2 mg/dL   Bilirubin, Direct 9.1 (H) 0.0 - 0.2 mg/dL   Indirect Bilirubin 6.1 (H) 0.3 - 0.9 mg/dL    Comment: Performed at [facility], [address], [place], [place] [address]  Ethanol     Status: None   Collection Time: 04/03/21  9:20 PM  Result Value Ref Range   Alcohol, Ethyl (B) <10 <10 mg/dL    Comment: (NOTE) Lowest detectable limit for serum alcohol is 10 mg/dL.  For medical purposes only. Performed at [facility], [address], [place], [place] [address]    Protime-INR     Status: Abnormal   Collection Time: 04/03/21  9:20 PM  Result Value Ref Range   Prothrombin Time 16.0 (H) 11.4 - 15.2 seconds   INR 1.3 (H) 0.8 - 1.2    Comment: (NOTE) INR goal varies based on device and disease states. Performed at [facility], [address], [place], [place] [address]   Urinalysis, Routine w reflex microscopic Urine, Clean Catch     Status: Abnormal   Collection Time: 04/03/21  9:23 PM  Result Value Ref Range   Color, Urine AMBER (A) YELLOW    Comment: BIOCHEMICALS MAY BE AFFECTED BY COLOR   APPearance CLEAR CLEAR   Specific Gravity, Urine 1.020 1.005 - 1.030   pH 6.5 5.0 - 8.0   Glucose, UA 100 (A) NEGATIVE mg/dL   Hgb urine dipstick NEGATIVE NEGATIVE   Bilirubin Urine LARGE (A) NEGATIVE   Ketones, ur 15 (A) NEGATIVE mg/dL   Protein, ur 30 (A) NEGATIVE mg/dL   Nitrite POSITIVE (A) NEGATIVE   Leukocytes,Ua TRACE (A) NEGATIVE    Comment: Performed at [facility], [address] [place], [place] [address]  Urinalysis, Microscopic (reflex)     Status: Abnormal   Collection Time: 04/03/21  9:23 PM  Result Value Ref Range   RBC / HPF 0-5 0 - 5 RBC/hpf   WBC, UA 0-5 0 - 5 WBC/hpf   Bacteria, UA MANY (A) NONE SEEN   Squamous Epithelial / LPF 0-5 0 - 5   Mucus PRESENT     Comment: Performed at [facility], [address], [place], [place] [address]  Culture, blood (routine x 2)     Status: None (Preliminary result)   Collection Time: 04/03/21 10:12 PM   Specimen: BLOOD RIGHT HAND  Result Value Ref Range   Specimen Description BLOOD RIGHT HAND    Special Requests      BOTTLES DRAWN AEROBIC AND ANAEROBIC Blood Culture adequate volume Performed at [facility], [address], [place], [place] [address]    Culture PENDING    Report Status PENDING   Culture, blood (routine x 2)     Status: None (Preliminary result)   Collection Time: 04/03/21 10:12 PM   Specimen: BLOOD LEFT HAND  Result Value Ref Range   Specimen Description BLOOD LEFT HAND     Special Requests      BOTTLES DRAWN AEROBIC AND ANAEROBIC Blood Culture adequate volume Performed at [facility], [address] [place], [place] [address]    Culture PENDING    Report Status PENDING   Lactic acid, plasma     Status: None   Collection Time: 04/03/21 10:12 PM  Result Value Ref Range  Lactic Acid, Venous 1.1 0.5 - 1.9 mmol/L    Comment: Performed at [facility], [address] [place], [place] [address]  Ammonia     Status: Abnormal   Collection Time: 04/03/21 10:12 PM  Result Value Ref Range   Ammonia 71 (H) 9 - 35 umol/L    Comment: Performed at [facility], [address] [place], [place] [address]  Resp Panel by RT-PCR (Flu A&B, Covid) Nasopharyngeal Swab     Status: Abnormal   Collection Time: 04/03/21 10:20 PM   Specimen: Nasopharyngeal Swab; Nasopharyngeal(NP) swabs in vial transport medium  Result Value Ref Range   SARS Coronavirus 2 by RT PCR POSITIVE (A) NEGATIVE    Comment: (NOTE) SARS-CoV-2 target nucleic acids are DETECTED.  The SARS-CoV-2 RNA is generally detectable in upper respiratory specimens during the acute phase of infection. Positive results are indicative of the presence of the identified virus, but do not rule out bacterial infection or co-infection with other pathogens not detected by the test. Clinical correlation with patient history and other diagnostic information is necessary to determine patient infection status. The expected result is Negative.  Fact Sheet for Patients: EntrepreneurPulse.com.au  Fact Sheet for Healthcare Providers: IncredibleEmployment.be  This test is not yet approved or cleared by the [place] FDA and  has been authorized for detection and/or diagnosis of SARS-CoV-2 by FDA under an Emergency Use Authorization (EUA).  This EUA will remain in effect (meaning this test can be used) for the duration of  the COVID-19 declaration under Section 564(b)(1) of the A ct,  21 U.S.C. section 360bbb-3(b)(1), unless the authorization is terminated or revoked sooner.     Influenza A by PCR NEGATIVE NEGATIVE   Influenza B by PCR NEGATIVE NEGATIVE    Comment: (NOTE) The Xpert Xpress SARS-CoV-2/FLU/RSV plus assay is intended as an aid in the diagnosis of influenza from Nasopharyngeal swab specimens and should not be used as a sole basis for treatment. Nasal washings and aspirates are unacceptable for Xpert Xpress SARS-CoV-2/FLU/RSV testing.  Fact Sheet for Patients: EntrepreneurPulse.com.au  Fact Sheet for Healthcare Providers: IncredibleEmployment.be  This test is not yet approved or cleared by the [place] FDA and has been authorized for detection and/or diagnosis of SARS-CoV-2 by FDA under an Emergency Use Authorization (EUA). This EUA will remain in effect (meaning this test can be used) for the duration of the COVID-19 declaration under Section 564(b)(1) of the Act, 21 U.S.C. section 360bbb-3(b)(1), unless the authorization is terminated or revoked.  Performed at [facility], [address], [place], [place] [address]   Troponin I (High Sensitivity)     Status: None   Collection Time: 04/03/21 11:15 PM  Result Value Ref Range   Troponin I (High Sensitivity) 8 <18 ng/L    Comment: (NOTE) Elevated high sensitivity troponin I (hsTnI) values and significant  changes across serial measurements may suggest ACS but many other  chronic and acute conditions are known to elevate hsTnI results.  Refer to the "Links" section for chest pain algorithms and additional  guidance. Performed at [facility], [address], [place], [place] [address]   Lactic acid, plasma     Status: None   Collection Time: 04/04/21 12:42 AM  Result Value Ref Range   Lactic Acid, Venous 1.3 0.5 - 1.9 mmol/L    Comment: Performed at [facility], [address] [place], [place] [address]    Chemistries  Recent Labs  Lab  03/28/21 1649 03/30/21 1159 04/03/21 2120  NA 133*  138 129*  K 3.5 3.5 3.3*  CL 98 101 97*  CO2 20 22 22   GLUCOSE 196* 148* 103*  BUN 5* 3* 8  CREATININE 0.56* 0.55* 0.47*  CALCIUM 8.2* 8.2* 8.2*  AST 110* 111* 229*  ALT 27 28 36  ALKPHOS  --   --  297*  BILITOT 7.9* 8.6* 15.2*   ------------------------------------------------------------------------------------------------------------------  ------------------------------------------------------------------------------------------------------------------ GFR: Estimated Creatinine Clearance: 122.4 mL/min (A) (by C-G formula based on SCr of 0.47 mg/dL (L)). Liver Function Tests: Recent Labs  Lab 03/28/21 1649 03/30/21 1159 04/03/21 2120  AST 110* 111* 229*  ALT 27 28 36  ALKPHOS  --   --  297*  BILITOT 7.9* 8.6* 15.2*  PROT 6.8 6.6 7.3  ALBUMIN  --   --  2.9*   No results for input(s): LIPASE, AMYLASE in the last 168 hours. Recent Labs  Lab 03/28/21 1649 04/03/21 2212  AMMONIA 100* 71*   Coagulation Profile: Recent Labs  Lab 04/03/21 2120  INR 1.3*   Cardiac Enzymes: No results for input(s): CKTOTAL, CKMB, CKMBINDEX, TROPONINI in the last 168 hours. BNP (last 3 results) No results for input(s): PROBNP in the last 8760 hours. HbA1C: No results for input(s): HGBA1C in the last 72 hours. CBG: No results for input(s): GLUCAP in the last 168 hours. Lipid Profile: No results for input(s): CHOL, HDL, LDLCALC, TRIG, CHOLHDL, LDLDIRECT in the last 72 hours. Thyroid Function Tests: No results for input(s): TSH, T4TOTAL, FREET4, T3FREE, THYROIDAB in the last 72 hours. Anemia Panel: No results for input(s): VITAMINB12, FOLATE, FERRITIN, TIBC, IRON, RETICCTPCT in the last 72 hours.  --------------------------------------------------------------------------------------------------------------- Urine analysis:    Component Value Date/Time   COLORURINE AMBER (A) 04/03/2021 2123   APPEARANCEUR CLEAR 04/03/2021 2123    LABSPEC 1.020 04/03/2021 2123   PHURINE 6.5 04/03/2021 2123   GLUCOSEU 100 (A) 04/03/2021 2123   HGBUR NEGATIVE 04/03/2021 2123   BILIRUBINUR LARGE (A) 04/03/2021 2123   KETONESUR 15 (A) 04/03/2021 2123   PROTEINUR 30 (A) 04/03/2021 2123   NITRITE POSITIVE (A) 04/03/2021 2123   LEUKOCYTESUR TRACE (A) 04/03/2021 2123      Imaging Results:    DG Chest Portable 1 View  Result Date: 04/03/2021 CLINICAL DATA:  Fever, jaundice, palpitations and hematuria EXAM: PORTABLE CHEST 1 VIEW COMPARISON:  Aug 13, 2019. FINDINGS: The heart size and mediastinal contours are within normal limits. No focal consolidation. No visible pleural effusion or pneumothorax. The visualized skeletal structures are unremarkable. IMPRESSION: No active disease. Electronically Signed   By: Dahlia Bailiff M.D.   On: 04/03/2021 22:24      Assessment & Plan:    Principal Problem:   Jaundice   Jaundice Continue gentle fluids Supportive care Recheck in the a.m. T bili currently 15.2-has been as high as 27 on previous hospitalizations Hypokalemia Replace with 40 mEq potassium Recheck in the a.m. Protein calorie malnutrition Secondary to liver disease Encourage p.o. intake Continue to monitor Alcoholic hepatitis AST 086, ammonia 71, T bili 15.2 With hepatomegaly and jump in LFTs we will get right upper quadrant ultrasound for completeness Patient reports the symptoms are consistent with previous hospitalizations Avoid hepatotoxic agents when possible Continue to monitor UTI Continue Rocephin Urine culture pending COVID-19 No oxygen requirement, vaccinated Holding paxlovid in the setting of transaminitis Continue to monitor    DVT Prophylaxis-   Heparin - SCDs   AM Labs Ordered, also please review Full Orders  Family Communication: No family at bedside  Code Status: Full  Admission status:  Inpatient :The appropriate admission status for this patient is INPATIENT. Inpatient status is judged to be  reasonable and necessary in order to provide the required intensity of service to ensure the patient's safety. The patient's presenting symptoms, physical exam findings, and initial radiographic and laboratory data in the context of their chronic comorbidities is felt to place them at high risk for further clinical deterioration. Furthermore, it is not anticipated that the patient will be medically stable for discharge from the hospital within 2 midnights of admission. The following factors support the admission status of inpatient.     The patient's presenting symptoms include jaundice. The worrisome physical exam findings include jaundice. The initial radiographic and laboratory data are worrisome because of T bili 15.2. The chronic co-morbidities include hepatitis.       * I certify that at the point of admission it is my clinical judgment that the patient will require inpatient hospital care spanning beyond 2 midnights from the point of admission due to high intensity of service, high risk for further deterioration and high frequency of surveillance required.*  Disposition: Anticipated Discharge date 48-72 hours discharge to home  Time spent in minutes : [place] DO

## 2021-04-04 NOTE — Progress Notes (Signed)
Patient admitted to the hospital earlier this morning by Dr. Clearence Ped  Patient seen and examined.  He is noted to be jaundiced with scleral icterus.  He complains of some abdominal distention and pain mostly in the left side of his abdomen.  Denies any shortness of breath, cough.  He was noted to be febrile on admission.  Hyperbilirubinemia -Etiology is not entirely clear -Reports that he is not having alcohol approximately a month -He had right upper quadrant ultrasound that did show distended gallbladder with wall thickening.  No definite stones were identified.  CBD measured up to 8 mm. -We will likely need further evaluation with MRCP -Acute viral hepatitis panel noted to be unremarkable -We will also request GI input -He has been started on Rocephin  COVID-19 infection -He did have mild fever, no respiratory symptoms -He reports that he did receive 2 Moderna vaccines, but has not had a booster.  Last vaccination was approximately a year ago. -Can consider starting him on molnupiravir, since he does have underlying comorbidities.  Possible UTI -Started on ceftriaxone -Follow-up culture  [facility]

## 2021-04-04 NOTE — Addendum Note (Signed)
Addended by: Jaynie Crumble on: 04/04/2021 01:51 PM   Modules accepted: Orders

## 2021-04-04 NOTE — Progress Notes (Addendum)
Initial Nutrition Assessment  DOCUMENTATION CODES:   Obesity unspecified  INTERVENTION:  Ensure Enlive daily   Provide snack for nursing nourishment room as desired  Ensure Max daily (protein supplement)  NUTRITION DIAGNOSIS:   Inadequate oral intake related to decreased appetite (pt presents with jaundice and abdominal pain) as evidenced by per patient/family report.   GOAL:  Patient will meet greater than or equal to 90% of their needs   MONITOR:  PO intake, Supplement acceptance, Labs, Skin, Weight trends  REASON FOR ASSESSMENT:   Malnutrition Screening Tool    ASSESSMENT: Patient has a hx of ETOH hepatitis, HTN, psoriasis and anxiety. Smokes tobacco. Presents with complaint of jaundice. Tested positive for COVID.   Patient ate well at breakfast 100% and was NPO for procedure at lunch. He is hungry and ready for dinner. He says appetite has been decreased lately and he has felt more bloated.  At home he drinks ONS for breakfast, eats a sandwich at lunch and cooks dinner in the evening for him and spouse.   Usual weight range 89-92 kg. Currently 91.2 kg.   Medications reviewed and include: rifaximin, morphine q 2hr IVF-NS@ 75 ml/hr  Labs: BMP Latest Ref Rng & Units 04/04/2021 04/03/2021 03/30/2021  Glucose 70 - 99 mg/dL 100(H) 103(H) 148(H)  BUN 6 - 20 mg/dL 8 8 3(L)  Creatinine 0.61 - 1.24 mg/dL 0.43(L) 0.47(L) 0.55(L)  BUN/Creat Ratio 6 - 22 (calc) - - 5(L)  Sodium 135 - 145 mmol/L 129(L) 129(L) 138  Potassium 3.5 - 5.1 mmol/L 3.6 3.3(L) 3.5  Chloride 98 - 111 mmol/L 100 97(L) 101  CO2 22 - 32 mmol/L 20(L) 22 22  Calcium 8.9 - 10.3 mg/dL 7.7(L) 8.2(L) 8.2(L)      NUTRITION - FOCUSED PHYSICAL EXAM:  Flowsheet Row Most Recent Value  Orbital Region No depletion  Upper Arm Region No depletion  Thoracic and Lumbar Region No depletion  Buccal Region No depletion  Temple Region No depletion  Clavicle Bone Region No depletion  Clavicle and Acromion Bone Region  No depletion  Scapular Bone Region No depletion  Dorsal Hand No depletion  Edema (RD Assessment) Mild  Hair Reviewed  Eyes Reviewed  Mouth Reviewed  Skin Reviewed  Nails Reviewed       Diet Order:   Diet Order             Diet Heart Room service appropriate? Yes; Fluid consistency: Thin  Diet effective now                   EDUCATION NEEDS:  Education needs have been addressed  Skin:  Skin Assessment: Reviewed RN Assessment  Last BM:  12/27 type 7 medium abdomen distended.   Height:   Ht Readings from Last 1 Encounters:  04/04/21 5' 8"  (1.727 m)    Weight:   Wt Readings from Last 1 Encounters:  04/04/21 91.2 kg    Ideal Body Weight:   70 kg  BMI:  Body mass index is 30.57 kg/m.  Estimated Nutritional Needs:   Kcal:  2100-2300  Protein:  105-112 gr  Fluid:  > 2 liters  Colman Cater MS,RD,CSG,LDN Contact: Shea Evans

## 2021-04-04 NOTE — Progress Notes (Signed)
°  Transition of Care [facility]) Screening Note   Patient Details  Name: Christopher Carrillo Date of Birth: 03-21-1971   Transition of Care [facility]) CM/SW Contact:    Ihor Gully, LCSW Phone Number: 04/04/2021, 3:32 PM    Transition of Care Department [facility]) has reviewed patient and no TOC needs have been identified at this time. We will continue to monitor patient advancement through interdisciplinary progression rounds. If new patient transition needs arise, please place a TOC consult.   Viva Gallaher, Clydene Pugh, LCSW

## 2021-04-05 ENCOUNTER — Telehealth: Payer: Self-pay | Admitting: Gastroenterology

## 2021-04-05 DIAGNOSIS — R1012 Left upper quadrant pain: Secondary | ICD-10-CM

## 2021-04-05 DIAGNOSIS — U071 COVID-19: Secondary | ICD-10-CM | POA: Diagnosis not present

## 2021-04-05 DIAGNOSIS — K7010 Alcoholic hepatitis without ascites: Secondary | ICD-10-CM | POA: Diagnosis not present

## 2021-04-05 DIAGNOSIS — R17 Unspecified jaundice: Secondary | ICD-10-CM

## 2021-04-05 DIAGNOSIS — K7030 Alcoholic cirrhosis of liver without ascites: Secondary | ICD-10-CM

## 2021-04-05 LAB — LIPASE, BLOOD: Lipase: 61 U/L — ABNORMAL HIGH (ref 11–51)

## 2021-04-05 LAB — COMPREHENSIVE METABOLIC PANEL
ALT: 29 U/L (ref 0–44)
AST: 148 U/L — ABNORMAL HIGH (ref 15–41)
Albumin: 2.5 g/dL — ABNORMAL LOW (ref 3.5–5.0)
Alkaline Phosphatase: 231 U/L — ABNORMAL HIGH (ref 38–126)
Anion gap: 8 (ref 5–15)
BUN: 7 mg/dL (ref 6–20)
CO2: 19 mmol/L — ABNORMAL LOW (ref 22–32)
Calcium: 7.9 mg/dL — ABNORMAL LOW (ref 8.9–10.3)
Chloride: 105 mmol/L (ref 98–111)
Creatinine, Ser: <0.3 mg/dL — ABNORMAL LOW (ref 0.61–1.24)
Glucose, Bld: 84 mg/dL (ref 70–99)
Potassium: 3.5 mmol/L (ref 3.5–5.1)
Sodium: 132 mmol/L — ABNORMAL LOW (ref 135–145)
Total Bilirubin: 15.7 mg/dL — ABNORMAL HIGH (ref 0.3–1.2)
Total Protein: 6.2 g/dL — ABNORMAL LOW (ref 6.5–8.1)

## 2021-04-05 LAB — URINE CULTURE: Culture: NO GROWTH

## 2021-04-05 LAB — CBC
HCT: 37.1 % — ABNORMAL LOW (ref 39.0–52.0)
Hemoglobin: 13.1 g/dL (ref 13.0–17.0)
MCH: 36.6 pg — ABNORMAL HIGH (ref 26.0–34.0)
MCHC: 35.3 g/dL (ref 30.0–36.0)
MCV: 103.6 fL — ABNORMAL HIGH (ref 80.0–100.0)
Platelets: 129 10*3/uL — ABNORMAL LOW (ref 150–400)
RBC: 3.58 MIL/uL — ABNORMAL LOW (ref 4.22–5.81)
RDW: 18.5 % — ABNORMAL HIGH (ref 11.5–15.5)
WBC: 7.2 10*3/uL (ref 4.0–10.5)
nRBC: 0 % (ref 0.0–0.2)

## 2021-04-05 LAB — PROTIME-INR
INR: 1.5 — ABNORMAL HIGH (ref 0.8–1.2)
Prothrombin Time: 17.9 seconds — ABNORMAL HIGH (ref 11.4–15.2)

## 2021-04-05 LAB — FOLATE: Folate: 3.2 ng/mL — ABNORMAL LOW (ref >5.9)

## 2021-04-05 LAB — VITAMIN B12: Vitamin B-12: 788 pg/mL (ref 180–914)

## 2021-04-05 MED ORDER — LACTULOSE 10 GM/15ML PO SOLN
10.0000 g | Freq: Every day | ORAL | Status: DC
  Filled 2021-04-05: qty 30

## 2021-04-05 MED ORDER — VITAMIN K1 10 MG/ML IJ SOLN
10.0000 mg | Freq: Once | INTRAMUSCULAR | Status: AC
  Administered 2021-04-05: 20:00:00 10 mg via SUBCUTANEOUS
  Filled 2021-04-05: qty 1

## 2021-04-05 MED ORDER — MOLNUPIRAVIR EUA 200MG CAPSULE
4.0000 | ORAL_CAPSULE | Freq: Two times a day (BID) | ORAL | Status: DC
  Administered 2021-04-05 – 2021-04-07 (×5): 800 mg via ORAL
  Filled 2021-04-05: qty 4

## 2021-04-05 NOTE — Progress Notes (Signed)
PROGRESS NOTE     Christopher Carrillo, is a 50 y.o. male, DOB - Dec 28, 1970, EVO:[id number]  Admit date - 04/03/2021   Admitting Physician Christopher Plate, DO  Outpatient Primary MD for the patient is Christopher Frizzle, MD  LOS - 1  Chief Complaint  Patient presents with   Jaundice   Palpitations   Hematuria        Brief Narrative:  50 y.o. male, with history of alcoholic liver cirrhosis with portal hypertension and anxiety, essential hypertension, psoriasis admitted on 04/04/2021 with elevated LFTs and COVID-19 infection  Assessment & Plan:   Principal Problem:   Jaundice Active Problems:   Tobacco use   Alcoholic hepatitis   [id number]   Hyperbilirubinemia/alcoholic liver cirrhosis -Patient has biopsy-proven alcoholic cirrhosis -He has cut back on alcoholic intake but does not actually quit per se  -Imaging studies including gallbladder ultrasound and MRCP consistent with alcoholic liver cirrhosis with diffuse steatosis - did show distended gallbladder with wall thickening.  No definite stones were identified.  CBD measured up to 8 mm. -Trace ascites noted -Acute viral hepatitis panel noted to be unremarkable -GI consult and input appreciated -GI service recommends watching him at least another 24 hours, hopefully to see downward trend in T bili and PT/INR, and to document no signs of fulminate liver failure.   -Daily labs for MELD, DF, platelets. -Lactulose adjusted by GI service,  -Continue rifaximin -Defer to GI service if patient is a candidate for prednisolone (urine culture NGTD) Hepatic Function Latest Ref Rng & Units 04/05/2021 04/04/2021 04/03/2021  Total Protein 6.5 - 8.1 g/dL 6.2(L) 6.1(L) 7.3  Albumin 3.5 - 5.0 g/dL 2.5(L) 2.5(L) 2.9(L)  AST 15 - 41 U/L 148(H) 197(H) 229(H)  ALT 0 - 44 U/L 29 31 36  Alk Phosphatase 38 - 126 U/L 231(H) 245(H) 297(H)  Total Bilirubin 0.3 - 1.2 mg/dL 15.7(H) 13.1(H) 15.2(H)  Bilirubin, Direct 0.0 - 0.2 mg/dL - - 9.1(H)       COVID-19 infection -He did have mild fever, no respiratory symptoms -No hypoxia, radiologically no COVID-pneumonia -He reports that he did receive 2 Moderna vaccines, but has not had a booster.  Last vaccination was approximately a year ago. -Okay to treat with molnupiravir, since he does have underlying comorbidities.   Possible UTI -Started on ceftriaxone -Follow-up culture--- so far negative  Hyponatremia--- sodium currently around 132, suspect related to underlying liver disease --- cannot rule out some component of beer potomania - Elevated lipase-----mild elevated lipase noted clinically and radiologically no evidence of significant pancreatitis at this time  Disposition/Need for in-Hospital Stay- patient unable to be discharged at this time due to --- GI service recommends watching him at least another 24 hours, hopefully to see downward trend in T bili and PT/INR, and to document no signs of fulminate liver failure.   Status is: Inpatient  Remains inpatient appropriate because:   Disposition: The patient is from: Home              Anticipated d/c is to: Home              Anticipated d/c date is: 1 day              Patient currently is not medically stable to d/c. Barriers: Not Clinically Stable-   Code Status :  -  Code Status: Full Code   Family Communication:    NA (patient is alert, awake and coherent)   Consults  :  Gi  DVT  Prophylaxis  :   - SCDs  heparin injection 5,000 Units Start: 04/04/21 0600 SCDs Start: 04/04/21 0223    Lab Results  Component Value Date   PLT 129 (L) 04/05/2021    Inpatient Medications  Scheduled Meds:  feeding supplement  237 mL Oral BID BM   heparin  5,000 Units Subcutaneous Q8H   [START ON 04/06/2021] lactulose  10 g Oral Daily   molnupiravir EUA  4 capsule Oral BID   phytonadione  10 mg Subcutaneous Once   Ensure Max Protein  11 oz Oral Daily   rifaximin  550 mg Oral BID   Continuous Infusions:  sodium chloride 75  mL/hr at 04/05/21 1715   cefTRIAXone (ROCEPHIN)  IV 1 g (04/04/21 2144)   PRN Meds:.ibuprofen, morphine injection, ondansetron **OR** ondansetron (ZOFRAN) IV   Anti-infectives (From admission, onward)    Start     Dose/Rate Route Frequency Ordered Stop   04/05/21 1215  molnupiravir EUA (LAGEVRIO) capsule 800 mg        4 capsule Oral 2 times daily 04/05/21 1124 04/10/21 0959   04/04/21 2200  cefTRIAXone (ROCEPHIN) 1 g in sodium chloride 0.9 % 100 mL IVPB        1 g 200 mL/hr over 30 Minutes Intravenous Every 24 hours 04/04/21 0609 04/05/21 2359   04/04/21 0315  rifaximin (XIFAXAN) tablet 550 mg        550 mg Oral 2 times daily 04/04/21 0222     04/03/21 2330  cefTRIAXone (ROCEPHIN) 1 g in sodium chloride 0.9 % 100 mL IVPB        1 g 200 mL/hr over 30 Minutes Intravenous  Once 04/03/21 2323 04/04/21 0013         Subjective: Christopher Carrillo today has no fevers, no emesis,  No chest pain,   Complains of fatigue and malaise -No productive cough, no hypoxia -Had BMs   Objective: Vitals:   04/04/21 0538 04/04/21 1333 04/04/21 2143 04/05/21 0508  BP: 113/67 116/67 114/61 121/79  Pulse: 96 85 81 85  Resp: 16 18 20 18   Temp: 98.7 F (37.1 C) 98.2 F (36.8 C) 98 F (36.7 C) 97.9 F (36.6 C)  TempSrc: Oral Oral Oral Oral  SpO2: 97% 98% 98% 100%  Weight:      Height:        Intake/Output Summary (Last 24 hours) at 04/05/2021 1946 Last data filed at 04/05/2021 1800 Gross per 24 hour  Intake 3378.39 ml  Output 300 ml  Net 3078.39 ml   Filed Weights   04/03/21 2052 04/04/21 0220  Weight: 88.5 kg 91.2 kg    Physical Exam  Gen:- Awake Alert,  in no acute distress HEENT:- [place].AT,  +VE sclera icterus Neck-Supple Neck,No JVD,.  Lungs-  CTAB , fair symmetrical air movement CV- S1, S2 normal, regular  Abd-  +ve B.Sounds, Abd Soft, mild left upper quadrant tenderness without rebound or guarding, increased truncal adiposity Extremity/Skin:- No  edema, pedal pulses present   Psych-affect is appropriate, oriented x3, patient is worried about being discharged home Neuro-generalized weakness, no new focal deficits, no tremors  Data Reviewed: I have personally reviewed following labs and imaging studies  CBC: Recent Labs  Lab 04/03/21 2120 04/04/21 0553 04/05/21 0604  WBC 8.8 7.5 7.2  HGB 14.3 12.9* 13.1  HCT 39.7 35.5* 37.1*  MCV 103.4* 102.9* 103.6*  PLT 167 144* 518*   Basic Metabolic Panel: Recent Labs  Lab 03/30/21 1159 04/03/21 2120 04/04/21 0553 04/05/21 0604  NA 138 129* 129* 132*  K 3.5 3.3* 3.6 3.5  CL 101 97* 100 105  CO2 22 22 20* 19*  GLUCOSE 148* 103* 100* 84  BUN 3* 8 8 7   CREATININE 0.55* 0.47* 0.43* <0.30*  CALCIUM 8.2* 8.2* 7.7* 7.9*  MG  --   --  1.7  --    GFR: CrCl cannot be calculated (This lab value cannot be used to calculate CrCl because it is not a number: <0.30). Liver Function Tests: Recent Labs  Lab 03/30/21 1159 04/03/21 2120 04/04/21 0553 04/05/21 0604  AST 111* 229* 197* 148*  ALT 28 36 31 29  ALKPHOS  --  297* 245* 231*  BILITOT 8.6* 15.2* 13.1* 15.7*  PROT 6.6 7.3 6.1* 6.2*  ALBUMIN  --  2.9* 2.5* 2.5*   Recent Labs  Lab 04/05/21 0604  LIPASE 61*   Recent Labs  Lab 04/03/21 2212  AMMONIA 71*   Coagulation Profile: Recent Labs  Lab 04/03/21 2120 04/04/21 0553 04/05/21 0604  INR 1.3* 1.4* 1.5*   Cardiac Enzymes: No results for input(s): CKTOTAL, CKMB, CKMBINDEX, TROPONINI in the last 168 hours. BNP (last 3 results) No results for input(s): PROBNP in the last 8760 hours. HbA1C: No results for input(s): HGBA1C in the last 72 hours. CBG: No results for input(s): GLUCAP in the last 168 hours. Lipid Profile: No results for input(s): CHOL, HDL, LDLCALC, TRIG, CHOLHDL, LDLDIRECT in the last 72 hours. Thyroid Function Tests: No results for input(s): TSH, T4TOTAL, FREET4, T3FREE, THYROIDAB in the last 72 hours. Anemia Panel: Recent Labs    04/05/21 0604  VITAMINB12 788  FOLATE 3.2*    Urine analysis:    Component Value Date/Time   COLORURINE AMBER (A) 04/03/2021 2123   APPEARANCEUR CLEAR 04/03/2021 2123   LABSPEC 1.020 04/03/2021 2123   PHURINE 6.5 04/03/2021 2123   GLUCOSEU 100 (A) 04/03/2021 2123   HGBUR NEGATIVE 04/03/2021 2123   BILIRUBINUR LARGE (A) 04/03/2021 2123   KETONESUR 15 (A) 04/03/2021 2123   PROTEINUR 30 (A) 04/03/2021 2123   NITRITE POSITIVE (A) 04/03/2021 2123   LEUKOCYTESUR TRACE (A) 04/03/2021 2123   Sepsis Labs: @LABRCNTIP (procalcitonin:4,lacticidven:4)  ) Recent Results (from the past 240 hour(s))  Urine Culture     Status: None   Collection Time: 04/03/21  9:23 PM   Specimen: Urine, Clean Catch  Result Value Ref Range Status   Specimen Description   Final    URINE, CLEAN CATCH Performed at [facility], [address] [place], [place] [address]    Special Requests   Final    NONE Performed at [facility], [address] [place], [place] [address]    Culture   Final    NO GROWTH Performed at [facility], [place] [address] [place], [place] [address]    Report Status 04/05/2021 FINAL  Final  Culture, blood (routine x 2)     Status: None (Preliminary result)   Collection Time: 04/03/21 10:12 PM   Specimen: BLOOD RIGHT HAND  Result Value Ref Range Status   Specimen Description BLOOD RIGHT HAND  Final   Special Requests   Final    BOTTLES DRAWN AEROBIC AND ANAEROBIC Blood Culture adequate volume   Culture   Final    NO GROWTH 2 DAYS Performed at [facility], [address], [place], [place] [address]    Report Status PENDING  Incomplete  Culture, blood (routine x 2)     Status: None (Preliminary result)   Collection Time: 04/03/21  10:12 PM   Specimen: BLOOD LEFT HAND  Result Value Ref Range Status   Specimen Description BLOOD LEFT HAND  Final   Special Requests   Final    BOTTLES DRAWN AEROBIC AND ANAEROBIC Blood Culture adequate volume   Culture   Final    NO GROWTH 2 DAYS Performed at [facility], [address], [place], [place] [address]    Report Status PENDING  Incomplete  Resp Panel by RT-PCR (Flu A&B, Covid) Nasopharyngeal Swab     Status: Abnormal   Collection Time: 04/03/21 10:20 PM   Specimen: Nasopharyngeal Swab; Nasopharyngeal(NP) swabs in vial transport medium  Result Value Ref Range Status   SARS Coronavirus 2 by RT PCR POSITIVE (A) NEGATIVE Final    Comment: (NOTE) SARS-CoV-2 target nucleic acids are DETECTED.  The SARS-CoV-2 RNA is generally detectable in upper respiratory specimens during the acute phase of infection. Positive results are indicative of the presence of the identified virus, but do not rule out bacterial infection or co-infection with other pathogens not detected by the test. Clinical correlation with patient history and other diagnostic information is necessary to determine patient infection status. The expected result is Negative.  Fact Sheet for Patients: EntrepreneurPulse.com.au  Fact Sheet for Healthcare Providers: IncredibleEmployment.be  This test is not yet approved or cleared by the [place] FDA and  has been authorized for detection and/or diagnosis of SARS-CoV-2 by FDA under an Emergency Use Authorization (EUA).  This EUA will remain in effect (meaning this test can be used) for the duration of  the COVID-19 declaration under Section 564(b)(1) of the A ct, 21 U.S.C. section 360bbb-3(b)(1), unless the authorization is terminated or revoked sooner.     Influenza A by PCR NEGATIVE NEGATIVE Final   Influenza B by PCR NEGATIVE NEGATIVE Final    Comment: (NOTE) The Xpert Xpress SARS-CoV-2/FLU/RSV plus assay is intended as an aid in the diagnosis of influenza from Nasopharyngeal swab specimens and should not be used as a sole basis for treatment. Nasal washings and aspirates are unacceptable for Xpert Xpress SARS-CoV-2/FLU/RSV testing.  Fact Sheet for  Patients: EntrepreneurPulse.com.au  Fact Sheet for Healthcare Providers: IncredibleEmployment.be  This test is not yet approved or cleared by the [place] FDA and has been authorized for detection and/or diagnosis of SARS-CoV-2 by FDA under an Emergency Use Authorization (EUA). This EUA will remain in effect (meaning this test can be used) for the duration of the COVID-19 declaration under Section 564(b)(1) of the Act, 21 U.S.C. section 360bbb-3(b)(1), unless the authorization is terminated or revoked.  Performed at [facility], [address] [place], [place] [address]       Radiology Studies: MR 3D Recon At Scanner  Result Date: 04/04/2021 CLINICAL DATA:  Jaundice, abnormal ultrasound. EXAM: MRI ABDOMEN WITHOUT AND WITH CONTRAST (INCLUDING MRCP) TECHNIQUE: Multiplanar multisequence MR imaging of the abdomen was performed both before and after the administration of intravenous contrast. Heavily T2-weighted images of the biliary and pancreatic ducts were obtained, and three-dimensional MRCP images were rendered by post processing. CONTRAST:  18m GADAVIST GADOBUTROL 1 MMOL/ML IV SOLN COMPARISON:  Multiple priors including ultrasound from same day and MRI December 30, 2020. FINDINGS: Despite efforts by the technologist and patient, motion artifact is present on today's exam and could not be eliminated. This reduces exam sensitivity and specificity. Lower chest: No acute abnormality. Hepatobiliary: Diffuse hepatic steatosis with morphologic changes of cirrhosis. No suspicious hepatic lesion, however postcontrast imaging is degraded by respiratory motion. Gallbladder is distended  with wall thickening without cholelithiasis, favored sequela of hepatocellular disease. No biliary ductal dilation, the common duct measures 4 mm. Pancreas: No pancreatic ductal dilation or evidence of acute inflammation. Spleen:  Splenomegaly measuring 13.8 cm in maximum axial  dimension. Adrenals/Urinary Tract: Bilateral adrenal glands are unremarkable. No hydronephrosis. No solid enhancing renal mass. Stomach/Bowel: Stomach is unremarkable for degree of distension. No pathologic dilation or evidence of acute inflammation involving loops of large or small bowel in the abdomen. Vascular/Lymphatic: No abdominal aortic aneurysm. The portal, splenic and superior mesenteric veins appear patent. Prominent upper abdominal lymph nodes are favored reactive. Other:  Trace perihepatic ascites. Musculoskeletal: No suspicious bone lesions identified. IMPRESSION: 1. Gallbladder is distended with wall thickening without cholelithiasis or dilated cystic duct, favored sequela of hepatocellular disease. If continued clinical concern for acute cholecystitis consider further evaluation with nuclear medicine HIDA scan. 2. No biliary ductal dilation or choledocholithiasis. 3. Hepatic cirrhosis and diffuse steatosis. No suspicious hepatic lesion, however postcontrast imaging is degraded by respiratory motion. 4. Sequela of portal hypertension including trace perihepatic ascites and splenomegaly. Electronically Signed   By: Dahlia Bailiff M.D.   On: 04/04/2021 20:46   DG Chest Portable 1 View  Result Date: 04/03/2021 CLINICAL DATA:  Fever, jaundice, palpitations and hematuria EXAM: PORTABLE CHEST 1 VIEW COMPARISON:  Aug 13, 2019. FINDINGS: The heart size and mediastinal contours are within normal limits. No focal consolidation. No visible pleural effusion or pneumothorax. The visualized skeletal structures are unremarkable. IMPRESSION: No active disease. Electronically Signed   By: Dahlia Bailiff M.D.   On: 04/03/2021 22:24   MR ABDOMEN MRCP W WO CONTAST  Result Date: 04/04/2021 CLINICAL DATA:  Jaundice, abnormal ultrasound. EXAM: MRI ABDOMEN WITHOUT AND WITH CONTRAST (INCLUDING MRCP) TECHNIQUE: Multiplanar multisequence MR imaging of the abdomen was performed both before and after the administration of  intravenous contrast. Heavily T2-weighted images of the biliary and pancreatic ducts were obtained, and three-dimensional MRCP images were rendered by post processing. CONTRAST:  43m GADAVIST GADOBUTROL 1 MMOL/ML IV SOLN COMPARISON:  Multiple priors including ultrasound from same day and MRI December 30, 2020. FINDINGS: Despite efforts by the technologist and patient, motion artifact is present on today's exam and could not be eliminated. This reduces exam sensitivity and specificity. Lower chest: No acute abnormality. Hepatobiliary: Diffuse hepatic steatosis with morphologic changes of cirrhosis. No suspicious hepatic lesion, however postcontrast imaging is degraded by respiratory motion. Gallbladder is distended with wall thickening without cholelithiasis, favored sequela of hepatocellular disease. No biliary ductal dilation, the common duct measures 4 mm. Pancreas: No pancreatic ductal dilation or evidence of acute inflammation. Spleen:  Splenomegaly measuring 13.8 cm in maximum axial dimension. Adrenals/Urinary Tract: Bilateral adrenal glands are unremarkable. No hydronephrosis. No solid enhancing renal mass. Stomach/Bowel: Stomach is unremarkable for degree of distension. No pathologic dilation or evidence of acute inflammation involving loops of large or small bowel in the abdomen. Vascular/Lymphatic: No abdominal aortic aneurysm. The portal, splenic and superior mesenteric veins appear patent. Prominent upper abdominal lymph nodes are favored reactive. Other:  Trace perihepatic ascites. Musculoskeletal: No suspicious bone lesions identified. IMPRESSION: 1. Gallbladder is distended with wall thickening without cholelithiasis or dilated cystic duct, favored sequela of hepatocellular disease. If continued clinical concern for acute cholecystitis consider further evaluation with nuclear medicine HIDA scan. 2. No biliary ductal dilation or choledocholithiasis. 3. Hepatic cirrhosis and diffuse steatosis. No  suspicious hepatic lesion, however postcontrast imaging is degraded by respiratory motion. 4. Sequela of portal hypertension including trace perihepatic ascites and splenomegaly.  Electronically Signed   By: Dahlia Bailiff M.D.   On: 04/04/2021 20:46   US Abdomen Limited RUQ (LIVER/GB)  Result Date: 04/04/2021 CLINICAL DATA:  Jaundice EXAM: ULTRASOUND ABDOMEN LIMITED RIGHT UPPER QUADRANT COMPARISON:  MRI September 2022, CT abdomen pelvis June 2022 FINDINGS: Gallbladder: The gallbladder is slightly distended with wall thickening measuring 6 mm. No significant pericholecystic fluid appreciated. Negative sonographic Murphy's sign. Common bile duct: Diameter: 0.8 cm, stable in size when compared to recent CT in June 2022. Liver: Diffuse increased echogenicity. The liver appears enlarged. No focal liver lesion. Portal vein is patent on color Doppler imaging with normal direction of blood flow towards the liver. Other: None. IMPRESSION: 1. Distended gallbladder with wall thickening. No definite stones are identified, and negative sonographic Murphy's sign. Findings are equivocal for acute cholecystitis, and may represent reactive wall edema secondary to another process. Correlate clinically. 2. Prominent appearance of the common bile duct measuring up to 8 mm, which is stable when compared to recent CT in June 2022. 3. Enlarged appearance of the liver with diffuse increased echogenicity, suggestive of global hepatocellular dysfunction. Electronically Signed   By: Albin Felling M.D.   On: 04/04/2021 15:49     Scheduled Meds:  feeding supplement  237 mL Oral BID BM   heparin  5,000 Units Subcutaneous Q8H   [START ON 04/06/2021] lactulose  10 g Oral Daily   molnupiravir EUA  4 capsule Oral BID   phytonadione  10 mg Subcutaneous Once   Ensure Max Protein  11 oz Oral Daily   rifaximin  550 mg Oral BID   Continuous Infusions:  sodium chloride 75 mL/hr at 04/05/21 1715   cefTRIAXone (ROCEPHIN)  IV 1 g  (04/04/21 2144)     LOS: 1 day    Roxan Hockey M.D on 04/05/2021 at 7:46 PM  Go to [email or web address] - for contact info  [facility] - Office  [phone]  If 7PM-7AM, please contact night-coverage [email or web address] Password [facility] 04/05/2021, 7:46 PM

## 2021-04-05 NOTE — Telephone Encounter (Signed)
Returned pt call. Feels the providers have not fully addressed his medical condition and needs to be hospitalized longer. Further added that he did test positive for COVID and felt he would have underwent further evaluation and diagnostic testing based on the results of his labs. Reassurance provided and advised he is in the most appropriate setting for care. Advised we remain available to the ED providers should they require our assistance with his care. Verbalized acceptance and understanding.

## 2021-04-05 NOTE — Telephone Encounter (Signed)
Patient called stating he was admitted into [facility] and would like to talk to Dr. Tarri Glenn about a diagnosis the doctors were giving him.  Please reach out to patient.  Thank you.

## 2021-04-05 NOTE — Consult Note (Addendum)
Referring Provider: Roxan Hockey, MD Primary Care Physician:  Susy Frizzle, MD Primary Gastroenterologist:  Rudolpho Sevin, Dr. Tarri Glenn. Also followed by Roosevelt Locks at [facility] clinic) last seen 02/21/21  Reason for Consultation:  abnormal LFTs  HPI: Christopher Carrillo is a 50 y.o. male with PMH significant for h/o etoh hepatitis, anxiety, HTN, psoriasis. He has biopsy proven alcohol associated cirrhosis which has been complicated by ascites requiring paracentesis and hepatic encephalopathy in setting of acute on chronic alcohol associated hepatitis. Presented to ED with complaints of abdominal pain and jaundice.   Patient reports abdominal pain started several days prior to admission. Pain is located in the left upper abdomen from the level of the umbilicus to the ribs. Never really had pain like this before. No change in stools but notes he has up to 15 loose stools daily on lactulose. He states he knows goal should be 3 stools a day but he continues on current dosage of lactulose. He is also on Xifaxan. No brbpr. Has had black stools off/on for a year. Notes black stools within past week. Denies Pepto-Bismol or iron. Abdominal pain unaffected by meals or BMs. Denies having fallen. He says when he got home from his endoscopy on 03/09/21 he had a quarter sized bruise in the left upper abdomen, with central "incision" that he thought should have had a stitch placed. He thought it was part of the endoscopy so he did not report. Place still there but healing. He states his LUQ pain is a constant ache (rates a 4 out of 10 on pain scale). If he roles over on left side or pushes on the abdomen, he has a sharp pain (8 out of 10). No N/V. No GERD. He has felt fatigued for several days and felt like he was having chills/sweats but could never document a fever. He decided to come to ED due to jaundice, that he has also noted for at least a week.   He feels his jaundice  was brought on by anesthesia given at time of upper endoscopy on 03/09/21. He reports that this is the third time he has developed jaundice after having anesthesia in the past couple of years. He reports last etoh was over a month ago. He states he quit drinking regularly or heavily over a year ago but before he quit completely last month, he would drink 1-2 beers or a glass of wine some days.  In 07/2019, admitted day after colonoscopy, he was noted to be jaundiced at time of procedure with recent labs consistent with etoh hepatitis at that time. There was mention of possible drug induced as well, by ketamine/propofol in the setting of chronic alcohol abuse. Admitted 08/2019 for LE edema, confusion, jaundice after having colonoscopy, thought to be due to etoh hepatitis, he was treated with prolonged course of prednisolone.   Patient reports only recent medication change in the past couple of months was taking Lexapro for short term but he did not tolerate due to SI.   In the ED, Temp 101.9. WBC 8800, Hgb 14.3, sodium 129, potassium 3.3, creatinine 0.47, BUN 8, Tbili 15.2, Direct bilirubin 9.1. AP 297, AST 229, ALT 36, albumin 2.9, INR 1.3, ammonia 71. CXR no active disease. COVID positive. Alcohol level less than 10. UA suspicious for UTI/culture pending (remain negative to date).   Today: lipase slightly elevated at 61, WBC 7200, hgb 13.1, platelets 129,000. Sodium 132, creatinine <0.3, t bili 15.7, AP 231, AST 138,  ALT 29. INR 1.5.  RUQ U/S 04/04/21: showed distended gallbladder with wall thickening. No definitie stones. CBD up to 12m.   MRCP 04/04/21: diffuse hepatic steatosis with cirrhosis. No focal liver lesions, however postcontrast imaging degraded by respiratory motion. Gallbladder distended with wall thickening, no stones. Favor sequela of hepatocellular disease. No biliary ductal dilation, CBD 466m Splenomegaly measuring 13.8cm. Pancreas unremarkable.   CT 09/2020: acute diverticulitis of  proximal ascending colon. Cirrhosis. Spleen normal in size.   CT imaging in 06/2019: showed masslike thickening within the cecum at the ICV measuring 4.8 cm. Colonosocpy 07/20/19 with biopsy of ileocecal mass; pathology showed no malignancy. Follow up colonoscopy 08/14/2019 with no cecal mass. In retrospect, findings felt to be due to intussusception.   EGD 03/09/21: Impression:        - Normal esophagus.                           - Portal hypertensive gastropathy.                           - Gastritis. Biopsied. Mild nonspecific inflammation with focal intestinal metaplasia, H.pylori not see.                            - Erythematous duodenopathy. Biopsied, no celiac on bx                           - The examination was otherwise normal.   Colonoscopy 08/14/19: - Internal and external hemorrhoids are the likely source of rectal bleeding. - Diverticulosis in the entire examined colon. - Ulceration with possible residual polyp located in the descending colon. Resected and retrieved. Tattooed. Biopsy: hyperplastic polyp - No evidence for active or recent bleeding.  Colonoscopy 07/20/19: - Diverticulosis in the entire examined colon. - One 5 mm polyp in the descending colon, removed with a cold snare. Resected and retrieved. Biopsy with tubular adenoma.  -Abnormal ileocecal valve with apparent mass as described. Status post biopsy.Biopsy benign. -Patient also noted to be mildly jaundiced. Recent hepatic profile consistent with alcoholic hepatitis. Patient states he stopped drinking 2 weeks ago. Of note, his ferritin is nearly 3000. Further iron studies not done.  Prior to Admission medications   Medication Sig Start Date End Date Taking? Authorizing Provider  lactulose, encephalopathy, (CHRONULAC) 10 GM/15ML SOLN Take 10 g by mouth 2 (two) times daily.   Yes [provider]  ondansetron (ZOFRAN) 4 MG tablet Take by mouth. 03/15/21  Yes [provider]  rifaximin (XIFAXAN) 550 MG  TABS tablet Take 1 tablet (550 mg total) by mouth 2 (two) times daily. 04/04/21  Yes PiSusy FrizzleMD    Current Facility-Administered Medications  Medication Dose Route Frequency Provider Last Rate Last Admin   0.9 %  sodium chloride infusion   Intravenous Continuous Zierle-Ghosh, Asia B, DO 75 mL/hr at 04/04/21 2145 New Bag at 04/04/21 2145   cefTRIAXone (ROCEPHIN) 1 g in sodium chloride 0.9 % 100 mL IVPB  1 g Intravenous Q24H Zierle-Ghosh, Asia B, DO 200 mL/hr at 04/04/21 2144 1 g at 04/04/21 2144   feeding supplement (ENSURE ENLIVE / ENSURE PLUS) liquid 237 mL  237 mL Oral BID BM Memon, JeJolaine ArtistMD       heparin injection 5,000 Units  5,000 Units Subcutaneous Q8H Zierle-Ghosh, Asia B, DO  5,000 Units at 04/05/21 0522   ibuprofen (ADVIL) tablet 400 mg  400 mg Oral Q6H PRN Zierle-Ghosh, Asia B, DO   400 mg at 04/04/21 1325   lactulose (CHRONULAC) 10 GM/15ML solution 10 g  10 g Oral BID Kathie Dike, MD   10 g at 04/04/21 2143   morphine 2 MG/ML injection 2 mg  2 mg Intravenous Q2H PRN Zierle-Ghosh, Asia B, DO   2 mg at 04/05/21 0819   ondansetron (ZOFRAN) tablet 4 mg  4 mg Oral Q6H PRN Zierle-Ghosh, Asia B, DO       Or   ondansetron (ZOFRAN) injection 4 mg  4 mg Intravenous Q6H PRN Zierle-Ghosh, Asia B, DO       protein supplement (ENSURE MAX) liquid  11 oz Oral Daily Memon, Jolaine Artist, MD       rifaximin (XIFAXAN) tablet 550 mg  550 mg Oral BID Zierle-Ghosh, Asia B, DO   550 mg at 04/04/21 2144    Allergies as of 04/03/2021   (No Known Allergies)    Past Medical History:  Diagnosis Date   Abdominal pain    Alcoholic hepatitis without ascites    Anemia    Anxiety    Arthritis    Elevated bilirubin    Essential hypertension    Psoriasis    Transaminitis     Past Surgical History:  Procedure Laterality Date   BIOPSY  07/20/2019   Procedure: BIOPSY;  Surgeon: Daneil Dolin, MD;  Location: [facility];  Service: Endoscopy;;   BIOPSY  08/14/2019   Procedure: BIOPSY;   Surgeon: Thornton Park, MD;  Location: [place];  Service: Gastroenterology;;   COLONOSCOPY     COLONOSCOPY WITH PROPOFOL N/A 07/20/2019   Procedure: COLONOSCOPY WITH PROPOFOL;  Surgeon: Daneil Dolin, MD;  Location: [facility];  Service: Endoscopy;  Laterality: N/A;  10:15am   COLONOSCOPY WITH PROPOFOL N/A 08/14/2019   Procedure: COLONOSCOPY WITH PROPOFOL;  Surgeon: Thornton Park, MD;  Location: [place];  Service: Gastroenterology;  Laterality: N/A;   IR PARACENTESIS  08/14/2019   POLYPECTOMY  07/20/2019   Procedure: POLYPECTOMY;  Surgeon: Daneil Dolin, MD;  Location: [facility];  Service: Endoscopy;;   SUBMUCOSAL TATTOO INJECTION  08/14/2019   Procedure: SUBMUCOSAL TATTOO INJECTION;  Surgeon: Thornton Park, MD;  Location: [facility];  Service: Gastroenterology;;    Family History  Problem Relation Age of Onset   Asthma Father    Cancer Sister 65       Pharyngeal   Colon cancer Neg Hx    Liver disease Neg Hx    Esophageal cancer Neg Hx    Rectal cancer Neg Hx    Stomach cancer Neg Hx     Social History   Socioeconomic History   Marital status: Married    Spouse name: Not on file   Number of children: Not on file   Years of education: Not on file   Highest education level: Not on file  Occupational History   Not on file  Tobacco Use   Smoking status: Every Day    Packs/day: 0.33    Types: Cigarettes   Smokeless tobacco: Never  Vaping Use   Vaping Use: Never used  Substance and Sexual Activity   Alcohol use: Yes    Alcohol/week: 1.0 standard drink    Types: 1 Cans of beer per week    Comment: occasionally   Drug use: No   Sexual activity: Yes  Other Topics Concern  Not on file  Social History Narrative   Not on file   Social Determinants of Health   Financial Resource Strain: Not on file  Food Insecurity: Not on file  Transportation Needs: Not on file  Physical Activity: Not on file  Stress: Not on file  Social  Connections: Not on file  Intimate Partner Violence: Not on file     ROS:  General: Negative for anorexia, weight loss, +fever, +chills, +fatigue, weakness. Feels like he is in a fog a lot. No confusion. Eyes: Negative for vision changes.  ENT: Negative for hoarseness, difficulty swallowing , nasal congestion. CV: Negative for chest pain, angina, palpitations, dyspnea on exertion, peripheral edema.  Respiratory: Negative for dyspnea at rest, dyspnea on exertion, cough, sputum, wheezing.  GI: See history of present illness. GU:  Negative for dysuria, hematuria, urinary incontinence, urinary frequency, nocturnal urination.  MS: Negative for joint pain, low back pain.  Derm: Negative for rash or itching.  Neuro: Negative for weakness, abnormal sensation, seizure, frequent headaches, memory loss, confusion.  Psych: Negative for anxiety, depression, suicidal ideation, hallucinations.  Endo: Negative for unusual weight change.  Heme: Negative for bruising or bleeding. Allergy: Negative for rash or hives.       Physical Examination: Vital signs in last 24 hours: Temp:  [97.9 F (36.6 C)-98.2 F (36.8 C)] 97.9 F (36.6 C) (12/28 0508) Pulse Rate:  [81-85] 85 (12/28 0508) Resp:  [18-20] 18 (12/28 0508) BP: (114-121)/(61-79) 121/79 (12/28 0508) SpO2:  [98 %-100 %] 100 % (12/28 0508) Last BM Date: 04/05/21  General: jaundiced, well-developed in no acute distress.  Head: Normocephalic, atraumatic.   Eyes: Conjunctiva pink, + icterus. Mouth:masked Neck: Supple without thyromegaly, masses, or lymphadenopathy.  Lungs: Clear to auscultation bilaterally.  Heart: Regular rate and rhythm, no murmurs rubs or gallops.  Abdomen: Bowel sounds are normal,nondistended, no hepatosplenomegaly or masses appreciated, no abdominal bruits or hernia, no rebound or guarding. Small pinpoint superficial opening in the skin, with mild induration but no redness or drainage in the RUQ. Tender right mid to upper  abdomen.  Rectal: not performed Extremities: No lower extremity edema, clubbing, deformity.  Neuro: Alert and oriented x 4 , grossly normal neurologically. No asterixis. Slight tremor in right hand. Skin: Warm and dry, no rash. + jaundice.   Psych: Alert and cooperative, normal mood and affect.        Intake/Output from previous day: 12/27 0701 - 12/28 0700 In: 1076 [P.O.:1076] Out: 300 [Urine:300] Intake/Output this shift: No intake/output data recorded.  Lab Results: CBC Recent Labs    04/03/21 2120 04/04/21 0553 04/05/21 0604  WBC 8.8 7.5 7.2  HGB 14.3 12.9* 13.1  HCT 39.7 35.5* 37.1*  MCV 103.4* 102.9* 103.6*  PLT 167 144* 129*   BMET Recent Labs    04/03/21 2120 04/04/21 0553 04/05/21 0604  NA 129* 129* 132*  K 3.3* 3.6 3.5  CL 97* 100 105  CO2 22 20* 19*  GLUCOSE 103* 100* 84  BUN 8 8 7   CREATININE 0.47* 0.43* <0.30*  CALCIUM 8.2* 7.7* 7.9*   LFT Recent Labs    04/03/21 2120 04/04/21 0553 04/05/21 0604  BILITOT 15.2* 13.1* 15.7*  BILIDIR 9.1*  --   --   IBILI 6.1*  --   --   ALKPHOS 297* 245* 231*  AST 229* 197* 148*  ALT 36 31 29  PROT 7.3 6.1* 6.2*  ALBUMIN 2.9* 2.5* 2.5*    Lipase Recent Labs    04/05/21 0604  LIPASE 61*    PT/INR Recent Labs    04/03/21 2120 04/04/21 0553 04/05/21 0604  LABPROT 16.0* 16.8* 17.9*  INR 1.3* 1.4* 1.5*   Lab Results  Component Value Date   IRON 50 07/20/2019   TIBC 131 (L) 07/20/2019   FERRITIN 2,953 (H) 07/14/2019     Imaging Studies: MR 3D Recon At Scanner  Result Date: 04/04/2021 CLINICAL DATA:  Jaundice, abnormal ultrasound. EXAM: MRI ABDOMEN WITHOUT AND WITH CONTRAST (INCLUDING MRCP) TECHNIQUE: Multiplanar multisequence MR imaging of the abdomen was performed both before and after the administration of intravenous contrast. Heavily T2-weighted images of the biliary and pancreatic ducts were obtained, and three-dimensional MRCP images were rendered by post processing. CONTRAST:  41m  GADAVIST GADOBUTROL 1 MMOL/ML IV SOLN COMPARISON:  Multiple priors including ultrasound from same day and MRI December 30, 2020. FINDINGS: Despite efforts by the technologist and patient, motion artifact is present on today's exam and could not be eliminated. This reduces exam sensitivity and specificity. Lower chest: No acute abnormality. Hepatobiliary: Diffuse hepatic steatosis with morphologic changes of cirrhosis. No suspicious hepatic lesion, however postcontrast imaging is degraded by respiratory motion. Gallbladder is distended with wall thickening without cholelithiasis, favored sequela of hepatocellular disease. No biliary ductal dilation, the common duct measures 4 mm. Pancreas: No pancreatic ductal dilation or evidence of acute inflammation. Spleen:  Splenomegaly measuring 13.8 cm in maximum axial dimension. Adrenals/Urinary Tract: Bilateral adrenal glands are unremarkable. No hydronephrosis. No solid enhancing renal mass. Stomach/Bowel: Stomach is unremarkable for degree of distension. No pathologic dilation or evidence of acute inflammation involving loops of large or small bowel in the abdomen. Vascular/Lymphatic: No abdominal aortic aneurysm. The portal, splenic and superior mesenteric veins appear patent. Prominent upper abdominal lymph nodes are favored reactive. Other:  Trace perihepatic ascites. Musculoskeletal: No suspicious bone lesions identified. IMPRESSION: 1. Gallbladder is distended with wall thickening without cholelithiasis or dilated cystic duct, favored sequela of hepatocellular disease. If continued clinical concern for acute cholecystitis consider further evaluation with nuclear medicine HIDA scan. 2. No biliary ductal dilation or choledocholithiasis. 3. Hepatic cirrhosis and diffuse steatosis. No suspicious hepatic lesion, however postcontrast imaging is degraded by respiratory motion. 4. Sequela of portal hypertension including trace perihepatic ascites and splenomegaly.  Electronically Signed   By: JDahlia BailiffM.D.   On: 04/04/2021 20:46   DG Chest Portable 1 View  Result Date: 04/03/2021 CLINICAL DATA:  Fever, jaundice, palpitations and hematuria EXAM: PORTABLE CHEST 1 VIEW COMPARISON:  Aug 13, 2019. FINDINGS: The heart size and mediastinal contours are within normal limits. No focal consolidation. No visible pleural effusion or pneumothorax. The visualized skeletal structures are unremarkable. IMPRESSION: No active disease. Electronically Signed   By: JDahlia BailiffM.D.   On: 04/03/2021 22:24   MR ABDOMEN MRCP W WO CONTAST  Result Date: 04/04/2021 CLINICAL DATA:  Jaundice, abnormal ultrasound. EXAM: MRI ABDOMEN WITHOUT AND WITH CONTRAST (INCLUDING MRCP) TECHNIQUE: Multiplanar multisequence MR imaging of the abdomen was performed both before and after the administration of intravenous contrast. Heavily T2-weighted images of the biliary and pancreatic ducts were obtained, and three-dimensional MRCP images were rendered by post processing. CONTRAST:  933mGADAVIST GADOBUTROL 1 MMOL/ML IV SOLN COMPARISON:  Multiple priors including ultrasound from same day and MRI December 30, 2020. FINDINGS: Despite efforts by the technologist and patient, motion artifact is present on today's exam and could not be eliminated. This reduces exam sensitivity and specificity. Lower chest: No acute abnormality. Hepatobiliary: Diffuse hepatic steatosis with morphologic changes of cirrhosis.  No suspicious hepatic lesion, however postcontrast imaging is degraded by respiratory motion. Gallbladder is distended with wall thickening without cholelithiasis, favored sequela of hepatocellular disease. No biliary ductal dilation, the common duct measures 4 mm. Pancreas: No pancreatic ductal dilation or evidence of acute inflammation. Spleen:  Splenomegaly measuring 13.8 cm in maximum axial dimension. Adrenals/Urinary Tract: Bilateral adrenal glands are unremarkable. No hydronephrosis. No solid  enhancing renal mass. Stomach/Bowel: Stomach is unremarkable for degree of distension. No pathologic dilation or evidence of acute inflammation involving loops of large or small bowel in the abdomen. Vascular/Lymphatic: No abdominal aortic aneurysm. The portal, splenic and superior mesenteric veins appear patent. Prominent upper abdominal lymph nodes are favored reactive. Other:  Trace perihepatic ascites. Musculoskeletal: No suspicious bone lesions identified. IMPRESSION: 1. Gallbladder is distended with wall thickening without cholelithiasis or dilated cystic duct, favored sequela of hepatocellular disease. If continued clinical concern for acute cholecystitis consider further evaluation with nuclear medicine HIDA scan. 2. No biliary ductal dilation or choledocholithiasis. 3. Hepatic cirrhosis and diffuse steatosis. No suspicious hepatic lesion, however postcontrast imaging is degraded by respiratory motion. 4. Sequela of portal hypertension including trace perihepatic ascites and splenomegaly. Electronically Signed   By: Dahlia Bailiff M.D.   On: 04/04/2021 20:46   US Abdomen Limited RUQ (LIVER/GB)  Result Date: 04/04/2021 CLINICAL DATA:  Jaundice EXAM: ULTRASOUND ABDOMEN LIMITED RIGHT UPPER QUADRANT COMPARISON:  MRI September 2022, CT abdomen pelvis June 2022 FINDINGS: Gallbladder: The gallbladder is slightly distended with wall thickening measuring 6 mm. No significant pericholecystic fluid appreciated. Negative sonographic Murphy's sign. Common bile duct: Diameter: 0.8 cm, stable in size when compared to recent CT in June 2022. Liver: Diffuse increased echogenicity. The liver appears enlarged. No focal liver lesion. Portal vein is patent on color Doppler imaging with normal direction of blood flow towards the liver. Other: None. IMPRESSION: 1. Distended gallbladder with wall thickening. No definite stones are identified, and negative sonographic Murphy's sign. Findings are equivocal for acute  cholecystitis, and may represent reactive wall edema secondary to another process. Correlate clinically. 2. Prominent appearance of the common bile duct measuring up to 8 mm, which is stable when compared to recent CT in June 2022. 3. Enlarged appearance of the liver with diffuse increased echogenicity, suggestive of global hepatocellular dysfunction. Electronically Signed   By: Albin Felling M.D.   On: 04/04/2021 15:49  [4 week]   Impression: 50 y/o male with history of biopsy-proven alcohol associated cirrhosis, previously complicated by ascites requiring paracentesis and hepatic encephalopathy in the setting of acute on chronic alcohol associated hepatitis.  Last admission for hepatic decompensation in May 2021.  Patient currently followed by Dr. Tarri Glenn with [facility], and sees Roosevelt Locks, NP at [facility] yearly. He presents this admission with complaints of left upper abdominal pain, jaundice which he noted over a week ago.  Symptoms associated with fatigue and fever.  He suspected symptoms were brought on by recent anesthesia on December 1 for upper endoscopy.  He states this is the third episode of jaundice, hepatic decompensation which seem to occur after anesthesia.  First 2 episodes occurred in March and April 2021 as outlined above.  He reports last alcohol consumption over 4 weeks ago.  Jaundice/cirrhosis: In the setting of alcohol-related cirrhosis.  Last alcohol consumption over 4 weeks ago, does not exclude possibility of alcoholic hepatitis.  Interestingly, he relates his hepatic decompensation to anesthesia events.  Acute hepatitis panel negative.  March 2021, he had visible jaundice at time of his colonoscopy  with recent labs reflecting alcoholic hepatitis but required admission for further acute decompensation with hepatic encephalopathy, ascites, lower extremity edema.  There has been concerned about drug-induced hepatitis (ketamine/propofol). Similar occurrence in 07/2019. This  admission, Right upper quadrant ultrasound and MRCP completed.  Initially ultrasound with stable CBD diameter of 8 mm compared to CT in March.  Liver enlarged, portal vein patent on Doppler.  Gallbladder slightly distended with wall thickening of 6 mm but no pericholecystic fluid or Murphy sign.  MRCP with no gallstones, CBD of 4 mm, splenomegaly, suspected gallbladder distention with wall thickening related to sequela of hepatocellular disease. Does not appear to have obstructive process.  On 04/03/21, MELD Na was 25 and DF was 29 (using PT control of 13).  Today MELD Na is 24. DF 38.2.   LUQ pain: unclear etiology.  Constant ache with episodes of sharp pain when palpated or lays on that side.  Does not appear to be related to meals or BMs.  Lipase minimally elevated without abnormality of the pancreas on MRI. Doubt pancreatitis. He has new finding of splenomegaly which could be contributing. Recent EGD as outlined with portal gastropathy, gastritis, duodenitis. He has history of diverticulitis with pancolonic disease, therefore could be playing a role.  Patient was concerned about left upper quadrant quarter sized bruise with central "incision like" lesion that occurred at time of his recent upper endoscopy.  Site appears to be healing without any obvious abnormalities on MRI.  Etiology unclear, patient initially thought it was part of the endoscopy therefore did not question the finding.  UTI: Suspected based on urinalysis. Patient currently on Rocephin.  Blood culture and urine cultures negative to date.  COVID: Febrile on presentation.  Afebrile since.  No shortness of breath or cough. Started on molnupiravir.  Melena: chronic. Unlikely to be significant given normal Hgb.   Elevated MCV: check folate/B12 levels. Likely due to chronic etoh use/cirrhosis/bone marrow suppresion.  Plan: Daily labs for MELD, DF, platelets. He would benefit from Prednisolone use given DF of 38.2 (given probable etoh  hepatitis) but currently on hold in setting of UTI.  Check B12/folate.  Follow up blood/urine cultures.  Monitor LUQ pain, if does not improve, could consider imaging via CT A/P to rule out diverticulitis. Currently on rocephin for UTI.  Lactulose, titrate for 3 soft BMs daily. He has had 6 stools in the last 24 hours. Will hold lactulose for 24 hours, then start at lower dose. Continue xifaxan. As discussed with attending, Dr. Denton Brick, would recommend watching him at least another 24 hours, hopefully to see downward trend in T bili and PT/INR, and to document no signs of fulminate liver failure.    We would like to thank you for the opportunity to participate in the care of [name].  Laureen Ochs. Bernarda Caffey [facility] [phone] 12/28/202212:43 PM    LOS: 1 day

## 2021-04-06 ENCOUNTER — Other Ambulatory Visit: Payer: Self-pay

## 2021-04-06 ENCOUNTER — Telehealth: Payer: Self-pay | Admitting: Gastroenterology

## 2021-04-06 ENCOUNTER — Ambulatory Visit: Payer: 59 | Admitting: Family Medicine

## 2021-04-06 DIAGNOSIS — K7469 Other cirrhosis of liver: Secondary | ICD-10-CM

## 2021-04-06 DIAGNOSIS — K7010 Alcoholic hepatitis without ascites: Secondary | ICD-10-CM | POA: Diagnosis not present

## 2021-04-06 DIAGNOSIS — R17 Unspecified jaundice: Secondary | ICD-10-CM | POA: Diagnosis not present

## 2021-04-06 DIAGNOSIS — K7682 Hepatic encephalopathy: Secondary | ICD-10-CM

## 2021-04-06 LAB — COMPREHENSIVE METABOLIC PANEL
ALT: 25 U/L (ref 0–44)
AST: 123 U/L — ABNORMAL HIGH (ref 15–41)
Albumin: 2.4 g/dL — ABNORMAL LOW (ref 3.5–5.0)
Alkaline Phosphatase: 224 U/L — ABNORMAL HIGH (ref 38–126)
Anion gap: 10 (ref 5–15)
BUN: 6 mg/dL (ref 6–20)
CO2: 16 mmol/L — ABNORMAL LOW (ref 22–32)
Calcium: 7.8 mg/dL — ABNORMAL LOW (ref 8.9–10.3)
Chloride: 105 mmol/L (ref 98–111)
Creatinine, Ser: <0.3 mg/dL — ABNORMAL LOW (ref 0.61–1.24)
Glucose, Bld: 89 mg/dL (ref 70–99)
Potassium: 3.3 mmol/L — ABNORMAL LOW (ref 3.5–5.1)
Sodium: 131 mmol/L — ABNORMAL LOW (ref 135–145)
Total Bilirubin: 16.8 mg/dL — ABNORMAL HIGH (ref 0.3–1.2)
Total Protein: 6 g/dL — ABNORMAL LOW (ref 6.5–8.1)

## 2021-04-06 LAB — PROTIME-INR
INR: 1.4 — ABNORMAL HIGH (ref 0.8–1.2)
Prothrombin Time: 17.4 seconds — ABNORMAL HIGH (ref 11.4–15.2)

## 2021-04-06 MED ORDER — POTASSIUM CHLORIDE CRYS ER 20 MEQ PO TBCR
40.0000 meq | EXTENDED_RELEASE_TABLET | ORAL | Status: AC
  Administered 2021-04-06 (×2): 40 meq via ORAL
  Filled 2021-04-06 (×2): qty 2

## 2021-04-06 MED ORDER — PREDNISOLONE 5 MG PO TABS
40.0000 mg | ORAL_TABLET | Freq: Every day | ORAL | Status: DC
  Administered 2021-04-06 – 2021-04-07 (×2): 40 mg via ORAL
  Filled 2021-04-06 (×5): qty 8

## 2021-04-06 MED ORDER — MORPHINE SULFATE (PF) 2 MG/ML IV SOLN
2.0000 mg | INTRAVENOUS | Status: DC | PRN
  Administered 2021-04-06 – 2021-04-07 (×4): 2 mg via INTRAVENOUS
  Filled 2021-04-06 (×4): qty 1

## 2021-04-06 MED ORDER — FOLIC ACID 1 MG PO TABS
1.0000 mg | ORAL_TABLET | Freq: Every day | ORAL | Status: DC
  Administered 2021-04-06 – 2021-04-07 (×2): 1 mg via ORAL
  Filled 2021-04-06 (×2): qty 1

## 2021-04-06 NOTE — Progress Notes (Signed)
PROGRESS NOTE     Christopher Carrillo, is a 50 y.o. male, DOB - 03/02/1971, OIZ:[id number]  Admit date - 04/03/2021   Admitting Physician Rolla Plate, DO  Outpatient Primary MD for the patient is Susy Frizzle, MD  LOS - 2  Chief Complaint  Patient presents with   Jaundice   Palpitations   Hematuria        Brief Narrative:  50 y.o. male, with history of alcoholic liver cirrhosis with portal hypertension and anxiety, essential hypertension, psoriasis admitted on 04/04/2021 with elevated LFTs and COVID-19 infection -GI service initiated prednisolone on 04/06/2021 ,.  GI service continues to recommends watching him at least another 24 hours, hopefully to see downward trend in T bili and PT/INR, and to document no signs of fulminate liver failure.   Assessment & Plan:   Principal Problem:   Jaundice Active Problems:   Tobacco use   Alcoholic hepatitis   [id number]   Hyperbilirubinemia/alcoholic liver cirrhosis -Patient has biopsy-proven alcoholic cirrhosis -He has cut back on alcoholic intake but did not actually quit per se  -Imaging studies including gallbladder ultrasound and MRCP consistent with alcoholic liver cirrhosis with diffuse steatosis - did show distended gallbladder with wall thickening.  No definite stones were identified.  CBD measured up to 8 mm. -Trace ascites noted -Acute viral hepatitis panel noted to be unremarkable -GI consult and input appreciated -GI service recommends watching him at least another 24 hours, hopefully to see downward trend in T bili and PT/INR, and to document no signs of fulminate liver failure.   -Daily labs for MELD, DF, platelets. -Lactulose adjusted by GI service,  -Continue rifaximin --GI service initiated prednisolone on 04/06/2021 ,.  GI service continues to recommends watching him at least another 24 hours, hopefully to see downward trend in T bili and PT/INR, and to document no signs of fulminate liver failure.    Hepatic Function Latest Ref Rng & Units 04/06/2021 04/05/2021 04/04/2021  Total Protein 6.5 - 8.1 g/dL 6.0(L) 6.2(L) 6.1(L)  Albumin 3.5 - 5.0 g/dL 2.4(L) 2.5(L) 2.5(L)  AST 15 - 41 U/L 123(H) 148(H) 197(H)  ALT 0 - 44 U/L _0 Alk Phosphatase 38 - 126 U/L 224(H) 231(H) 245(H)  Total Bilirubin 0.3 - 1.2 mg/dL 16.8(H) 15.7(H) 13.1(H)  Bilirubin, Direct 0.0 - 0.2 mg/dL - - -      COVID-19 infection -He did have mild fever, no respiratory symptoms -No hypoxia, radiologically no COVID-pneumonia -He reports that he did receive 2 Moderna vaccines, but has not had a booster.  Last vaccination was approximately a year ago. -Okay to treat with molnupiravir, since he does have underlying comorbidities.   Possible UTI -Started on ceftriaxone -Follow-up culture--- so far negative  Hyponatremia--- sodium currently around 131, suspect related to underlying liver disease --- cannot rule out some component of beer potomania - Elevated lipase-----mild elevated lipase noted clinically and radiologically no evidence of significant pancreatitis at this time  -Hypokalemia suspect due to lactulose induced diarrhea--replace and recheck  Disposition/Need for in-Hospital Stay- patient unable to be discharged at this time due to --- GI service initiated prednisolone on 04/06/2021 ,.  GI service continues to recommends watching him at least another 24 hours, hopefully to see downward trend in T bili and PT/INR, and to document no signs of fulminate liver failure.   Status is: Inpatient  Remains inpatient appropriate because:   Disposition: The patient is from: Home  Anticipated d/c is to: Home              Anticipated d/c date is: 1 day              Patient currently is not medically stable to d/c. Barriers: Not Clinically Stable-   Code Status :  -  Code Status: Full Code   Family Communication:    NA (patient is alert, awake and coherent)   Consults  :  Gi  DVT Prophylaxis  :    - SCDs  heparin injection 5,000 Units Start: 04/04/21 0600 SCDs Start: 04/04/21 2637    Lab Results  Component Value Date   PLT 129 (L) 04/05/2021    Inpatient Medications  Scheduled Meds:  feeding supplement  237 mL Oral BID BM   folic acid  1 mg Oral Daily   heparin  5,000 Units Subcutaneous Q8H   lactulose  10 g Oral Daily   molnupiravir EUA  4 capsule Oral BID   prednisoLONE  40 mg Oral Daily   Ensure Max Protein  11 oz Oral Daily   rifaximin  550 mg Oral BID   Continuous Infusions:  sodium chloride 75 mL/hr at 04/06/21 1439   PRN Meds:.ibuprofen, morphine injection, ondansetron **OR** ondansetron (ZOFRAN) IV   Anti-infectives (From admission, onward)    Start     Dose/Rate Route Frequency Ordered Stop   04/05/21 1215  molnupiravir EUA (LAGEVRIO) capsule 800 mg        4 capsule Oral 2 times daily 04/05/21 1124 04/10/21 0959   04/04/21 2200  cefTRIAXone (ROCEPHIN) 1 g in sodium chloride 0.9 % 100 mL IVPB        1 g 200 mL/hr over 30 Minutes Intravenous Every 24 hours 04/04/21 0609 04/05/21 2140   04/04/21 0315  rifaximin (XIFAXAN) tablet 550 mg        550 mg Oral 2 times daily 04/04/21 0222     04/03/21 2330  cefTRIAXone (ROCEPHIN) 1 g in sodium chloride 0.9 % 100 mL IVPB        1 g 200 mL/hr over 30 Minutes Intravenous  Once 04/03/21 2323 04/04/21 0013         Subjective: [name] today has no fevers, no emesis,  No chest pain,   -Requesting increasing narcotics dosage and frequency -Oral intake is okay -Continue to have BMs - GI service initiated prednisolone on 04/06/2021 ,.  GI service continues to recommends watching him at least another 24 hours, hopefully to see downward trend in T bili and PT/INR, and to document no signs of fulminate liver failure.   Objective: Vitals:   04/05/21 0508 04/05/21 2228 04/06/21 0608 04/06/21 1453  BP: 121/79 115/70 130/66 113/65  Pulse: 85 93 92 92  Resp: _0 Temp: 97.9 F (36.6 C) (!) 97.4 F  (36.3 C) 97.7 F (36.5 C)   TempSrc: Oral  Oral   SpO2: 100% 98% 98% 98%  Weight:      Height:        Intake/Output Summary (Last 24 hours) at 04/06/2021 2004 Last data filed at 04/06/2021 1848 Gross per 24 hour  Intake 1019.6 ml  Output 750 ml  Net 269.6 ml   Filed Weights   04/03/21 2052 04/04/21 0220  Weight: 88.5 kg 91.2 kg    Physical Exam  Gen:- Awake Alert,  in no acute distress HEENT:- [place].AT,  +VE sclera icterus Neck-Supple Neck,No JVD,.  Lungs-  CTAB , fair symmetrical air  movement CV- S1, S2 normal, regular  Abd-  +ve B.Sounds, Abd Soft, mild left upper quadrant tenderness without rebound or guarding, increased truncal adiposity Extremity/Skin:- No  edema, pedal pulses present  Psych-affect is appropriate, oriented x3, Neuro-generalized weakness, no new focal deficits, no tremors  Data Reviewed: I have personally reviewed following labs and imaging studies  CBC: Recent Labs  Lab 04/03/21 2120 04/04/21 0553 04/05/21 0604  WBC 8.8 7.5 7.2  HGB 14.3 12.9* 13.1  HCT 39.7 35.5* 37.1*  MCV 103.4* 102.9* 103.6*  PLT 167 144* 175*   Basic Metabolic Panel: Recent Labs  Lab 04/03/21 2120 04/04/21 0553 04/05/21 0604 04/06/21 0621  NA 129* 129* 132* 131*  K 3.3* 3.6 3.5 3.3*  CL 97* 100 105 105  CO2 22 20* 19* 16*  GLUCOSE 103* 100* 84 89  BUN _0 CREATININE 0.47* 0.43* <0.30* <0.30*  CALCIUM 8.2* 7.7* 7.9* 7.8*  MG  --  1.7  --   --    GFR: CrCl cannot be calculated (This lab value cannot be used to calculate CrCl because it is not a number: <0.30). Liver Function Tests: Recent Labs  Lab 04/03/21 2120 04/04/21 0553 04/05/21 0604 04/06/21 0621  AST 229* 197* 148* 123*  ALT 36 _1 ALKPHOS 297* 245* 231* 224*  BILITOT 15.2* 13.1* 15.7* 16.8*  PROT 7.3 6.1* 6.2* 6.0*  ALBUMIN 2.9* 2.5* 2.5* 2.4*   Recent Labs  Lab 04/05/21 0604  LIPASE 61*   Recent Labs  Lab 04/03/21 2212  AMMONIA 71*   Coagulation Profile: Recent Labs   Lab 04/03/21 2120 04/04/21 0553 04/05/21 0604 04/06/21 0621  INR 1.3* 1.4* 1.5* 1.4*   Cardiac Enzymes: No results for input(s): CKTOTAL, CKMB, CKMBINDEX, TROPONINI in the last 168 hours. BNP (last 3 results) No results for input(s): PROBNP in the last 8760 hours. HbA1C: No results for input(s): HGBA1C in the last 72 hours. CBG: No results for input(s): GLUCAP in the last 168 hours. Lipid Profile: No results for input(s): CHOL, HDL, LDLCALC, TRIG, CHOLHDL, LDLDIRECT in the last 72 hours. Thyroid Function Tests: No results for input(s): TSH, T4TOTAL, FREET4, T3FREE, THYROIDAB in the last 72 hours. Anemia Panel: Recent Labs    04/05/21 0604  VITAMINB12 788  FOLATE 3.2*   Urine analysis:    Component Value Date/Time   COLORURINE AMBER (A) 04/03/2021 2123   APPEARANCEUR CLEAR 04/03/2021 2123   LABSPEC 1.020 04/03/2021 2123   PHURINE 6.5 04/03/2021 2123   GLUCOSEU 100 (A) 04/03/2021 2123   HGBUR NEGATIVE 04/03/2021 2123   BILIRUBINUR LARGE (A) 04/03/2021 2123   KETONESUR 15 (A) 04/03/2021 2123   PROTEINUR 30 (A) 04/03/2021 2123   NITRITE POSITIVE (A) 04/03/2021 2123   LEUKOCYTESUR TRACE (A) 04/03/2021 2123   Sepsis Labs: _2 (procalcitonin:4,lacticidven:4)  ) Recent Results (from the past 240 hour(s))  Urine Culture     Status: None   Collection Time: 04/03/21  9:23 PM   Specimen: Urine, Clean Catch  Result Value Ref Range Status   Specimen Description   Final    URINE, CLEAN CATCH Performed at [facility], [address], [place], [place] [address]    Special Requests   Final    NONE Performed at [facility], [address], [place], [place] [address]    Culture   Final    NO GROWTH Performed at [facility], [place] [address], [place], [place] [address]    Report Status 04/05/2021 FINAL  Final  Culture, blood (routine x 2)     Status: None (Preliminary result)   Collection Time: 04/03/21 10:12 PM   Specimen: BLOOD RIGHT HAND  Result Value Ref  Range Status   Specimen Description BLOOD RIGHT HAND  Final   Special Requests   Final    BOTTLES DRAWN AEROBIC AND ANAEROBIC Blood Culture adequate volume   Culture   Final    NO GROWTH 2 DAYS Performed at [facility], [address] [place], [place] [address]    Report Status PENDING  Incomplete  Culture, blood (routine x 2)     Status: None (Preliminary result)   Collection Time: 04/03/21 10:12 PM   Specimen: BLOOD LEFT HAND  Result Value Ref Range Status   Specimen Description BLOOD LEFT HAND  Final   Special Requests   Final    BOTTLES DRAWN AEROBIC AND ANAEROBIC Blood Culture adequate volume   Culture   Final    NO GROWTH 2 DAYS Performed at [facility], [address] [place], [place] [address]    Report Status PENDING  Incomplete  Resp Panel by RT-PCR (Flu A&B, Covid) Nasopharyngeal Swab     Status: Abnormal   Collection Time: 04/03/21 10:20 PM   Specimen: Nasopharyngeal Swab; Nasopharyngeal(NP) swabs in vial transport medium  Result Value Ref Range Status   SARS Coronavirus 2 by RT PCR POSITIVE (A) NEGATIVE Final    Comment: (NOTE) SARS-CoV-2 target nucleic acids are DETECTED.  The SARS-CoV-2 RNA is generally detectable in upper respiratory specimens during the acute phase of infection. Positive results are indicative of the presence of the identified virus, but do not rule out bacterial infection or co-infection with other pathogens not detected by the test. Clinical correlation with patient history and other diagnostic information is necessary to determine patient infection status. The expected result is Negative.  Fact Sheet for Patients: EntrepreneurPulse.com.au  Fact Sheet for Healthcare Providers: IncredibleEmployment.be  This test is not yet approved or cleared by the [place] FDA and  has been authorized for detection and/or diagnosis of SARS-CoV-2 by FDA under an Emergency Use Authorization (EUA).  This EUA  will remain in effect (meaning this test can be used) for the duration of  the COVID-19 declaration under Section 564(b)(1) of the A ct, 21 U.S.C. section 360bbb-3(b)(1), unless the authorization is terminated or revoked sooner.     Influenza A by PCR NEGATIVE NEGATIVE Final   Influenza B by PCR NEGATIVE NEGATIVE Final    Comment: (NOTE) The Xpert Xpress SARS-CoV-2/FLU/RSV plus assay is intended as an aid in the diagnosis of influenza from Nasopharyngeal swab specimens and should not be used as a sole basis for treatment. Nasal washings and aspirates are unacceptable for Xpert Xpress SARS-CoV-2/FLU/RSV testing.  Fact Sheet for Patients: EntrepreneurPulse.com.au  Fact Sheet for Healthcare Providers: IncredibleEmployment.be  This test is not yet approved or cleared by the [place] FDA and has been authorized for detection and/or diagnosis of SARS-CoV-2 by FDA under an Emergency Use Authorization (EUA). This EUA will remain in effect (meaning this test can be used) for the duration of the COVID-19 declaration under Section 564(b)(1) of the Act, 21 U.S.C. section 360bbb-3(b)(1), unless the authorization is terminated or revoked.  Performed at [facility], [address], [place],  [address]       Radiology Studies: No results found.   Scheduled Meds:  feeding supplement  237 mL Oral BID BM   folic acid  1 mg Oral Daily   heparin  5,000  Units Subcutaneous Q8H   lactulose  10 g Oral Daily   molnupiravir EUA  4 capsule Oral BID   prednisoLONE  40 mg Oral Daily   Ensure Max Protein  11 oz Oral Daily   rifaximin  550 mg Oral BID   Continuous Infusions:  sodium chloride 75 mL/hr at 04/06/21 1439     LOS: 2 days    Roxan Hockey M.D on 04/06/2021 at 8:04 PM  Go to [email or web address] - for contact info  [facility] - Office  [phone]  If 7PM-7AM, please contact night-coverage [email or web address] Password  [facility] 04/06/2021, 8:04 PM

## 2021-04-06 NOTE — Telephone Encounter (Signed)
Tammy, we actually should get CMET, PT/INR, CBC. Can you order these and cancel the LFTs.

## 2021-04-06 NOTE — Progress Notes (Addendum)
Subjective:  Complains of bad headache. Not completely controlled by Morphine. Asking for his dosage to be adjusted. He is concerned about going home, does not want home pain medication for fear of getting addicted. States he does not feel any better. He has persistent LUQ discomfort. Complains of pain at site of heparin injections. Continues with several loose stools daily. Held dose today. No melena, brbpr. No cough, shortness of breath.   Objective: Vital signs in last 24 hours: Temp:  [97.4 F (36.3 C)-97.7 F (36.5 C)] 97.7 F (36.5 C) (12/29 2707) Pulse Rate:  [92-93] 92 (12/29 0608) Resp:  [18-20] 18 (12/29 8675) BP: (115-130)/(66-70) 130/66 (12/29 0608) SpO2:  [98 %] 98 % (12/29 0608) Last BM Date: 04/05/21 General:   Alert,  Well-developed, well-nourished, pleasant and cooperative in NAD Head:  Normocephalic and atraumatic. Eyes:  Sclera clear, + icterus.  Abdomen:  Soft,left hepatic lobe extends well into the LUQ. Enlarged, hard. Some LUQ tenderness, no guarding/rebound. Extremities:  Without clubbing, deformity or edema. Neurologic:  Alert and  oriented x4;  grossly normal neurologically. Skin:  Intact without significant lesions or rashes. +jaundice. Psych:  Alert and cooperative. Normal mood and affect.  Intake/Output from previous day: 12/28 0701 - 12/29 0700 In: 4492 [P.O.:592; I.V.:2986; IV Piggyback:100] Out: -  Intake/Output this shift: No intake/output data recorded.  Lab Results: CBC Recent Labs    04/03/21 2120 04/04/21 0553 04/05/21 0604  WBC 8.8 7.5 7.2  HGB 14.3 12.9* 13.1  HCT 39.7 35.5* 37.1*  MCV 103.4* 102.9* 103.6*  PLT 167 144* 129*   BMET Recent Labs    04/04/21 0553 04/05/21 0604 04/06/21 0621  NA 129* 132* 131*  K 3.6 3.5 3.3*  CL 100 105 105  CO2 20* 19* 16*  GLUCOSE 100* 84 89  BUN 8 7 6   CREATININE 0.43* <0.30* <0.30*  CALCIUM 7.7* 7.9* 7.8*   LFTs Recent Labs    04/03/21 2120 04/04/21 0553 04/05/21 0604 04/06/21 0621   BILITOT 15.2* 13.1* 15.7* 16.8*  BILIDIR 9.1*  --   --   --   IBILI 6.1*  --   --   --   ALKPHOS 297* 245* 231* 224*  AST 229* 197* 148* 123*  ALT 36 31 29 25   PROT 7.3 6.1* 6.2* 6.0*  ALBUMIN 2.9* 2.5* 2.5* 2.4*   Recent Labs    04/05/21 0604  LIPASE 61*   PT/INR Recent Labs    04/04/21 0553 04/05/21 0604 04/06/21 0621  LABPROT 16.8* 17.9* 17.4*  INR 1.4* 1.5* 1.4*   Lab Results  Component Value Date   VITAMINB12 788 04/05/2021   Lab Results  Component Value Date   FOLATE 3.2 (L) 04/05/2021        Imaging Studies: MR 3D Recon At Scanner  Result Date: 04/04/2021 CLINICAL DATA:  Jaundice, abnormal ultrasound. EXAM: MRI ABDOMEN WITHOUT AND WITH CONTRAST (INCLUDING MRCP) TECHNIQUE: Multiplanar multisequence MR imaging of the abdomen was performed both before and after the administration of intravenous contrast. Heavily T2-weighted images of the biliary and pancreatic ducts were obtained, and three-dimensional MRCP images were rendered by post processing. CONTRAST:  34m GADAVIST GADOBUTROL 1 MMOL/ML IV SOLN COMPARISON:  Multiple priors including ultrasound from same day and MRI December 30, 2020. FINDINGS: Despite efforts by the technologist and patient, motion artifact is present on today's exam and could not be eliminated. This reduces exam sensitivity and specificity. Lower chest: No acute abnormality. Hepatobiliary: Diffuse hepatic steatosis with morphologic changes of cirrhosis. No suspicious  hepatic lesion, however postcontrast imaging is degraded by respiratory motion. Gallbladder is distended with wall thickening without cholelithiasis, favored sequela of hepatocellular disease. No biliary ductal dilation, the common duct measures 4 mm. Pancreas: No pancreatic ductal dilation or evidence of acute inflammation. Spleen:  Splenomegaly measuring 13.8 cm in maximum axial dimension. Adrenals/Urinary Tract: Bilateral adrenal glands are unremarkable. No hydronephrosis. No solid  enhancing renal mass. Stomach/Bowel: Stomach is unremarkable for degree of distension. No pathologic dilation or evidence of acute inflammation involving loops of large or small bowel in the abdomen. Vascular/Lymphatic: No abdominal aortic aneurysm. The portal, splenic and superior mesenteric veins appear patent. Prominent upper abdominal lymph nodes are favored reactive. Other:  Trace perihepatic ascites. Musculoskeletal: No suspicious bone lesions identified. IMPRESSION: 1. Gallbladder is distended with wall thickening without cholelithiasis or dilated cystic duct, favored sequela of hepatocellular disease. If continued clinical concern for acute cholecystitis consider further evaluation with nuclear medicine HIDA scan. 2. No biliary ductal dilation or choledocholithiasis. 3. Hepatic cirrhosis and diffuse steatosis. No suspicious hepatic lesion, however postcontrast imaging is degraded by respiratory motion. 4. Sequela of portal hypertension including trace perihepatic ascites and splenomegaly. Electronically Signed   By: Dahlia Bailiff M.D.   On: 04/04/2021 20:46   DG Chest Portable 1 View  Result Date: 04/03/2021 CLINICAL DATA:  Fever, jaundice, palpitations and hematuria EXAM: PORTABLE CHEST 1 VIEW COMPARISON:  Aug 13, 2019. FINDINGS: The heart size and mediastinal contours are within normal limits. No focal consolidation. No visible pleural effusion or pneumothorax. The visualized skeletal structures are unremarkable. IMPRESSION: No active disease. Electronically Signed   By: Dahlia Bailiff M.D.   On: 04/03/2021 22:24   MR ABDOMEN MRCP W WO CONTAST  Result Date: 04/04/2021 CLINICAL DATA:  Jaundice, abnormal ultrasound. EXAM: MRI ABDOMEN WITHOUT AND WITH CONTRAST (INCLUDING MRCP) TECHNIQUE: Multiplanar multisequence MR imaging of the abdomen was performed both before and after the administration of intravenous contrast. Heavily T2-weighted images of the biliary and pancreatic ducts were obtained, and  three-dimensional MRCP images were rendered by post processing. CONTRAST:  32m GADAVIST GADOBUTROL 1 MMOL/ML IV SOLN COMPARISON:  Multiple priors including ultrasound from same day and MRI December 30, 2020. FINDINGS: Despite efforts by the technologist and patient, motion artifact is present on today's exam and could not be eliminated. This reduces exam sensitivity and specificity. Lower chest: No acute abnormality. Hepatobiliary: Diffuse hepatic steatosis with morphologic changes of cirrhosis. No suspicious hepatic lesion, however postcontrast imaging is degraded by respiratory motion. Gallbladder is distended with wall thickening without cholelithiasis, favored sequela of hepatocellular disease. No biliary ductal dilation, the common duct measures 4 mm. Pancreas: No pancreatic ductal dilation or evidence of acute inflammation. Spleen:  Splenomegaly measuring 13.8 cm in maximum axial dimension. Adrenals/Urinary Tract: Bilateral adrenal glands are unremarkable. No hydronephrosis. No solid enhancing renal mass. Stomach/Bowel: Stomach is unremarkable for degree of distension. No pathologic dilation or evidence of acute inflammation involving loops of large or small bowel in the abdomen. Vascular/Lymphatic: No abdominal aortic aneurysm. The portal, splenic and superior mesenteric veins appear patent. Prominent upper abdominal lymph nodes are favored reactive. Other:  Trace perihepatic ascites. Musculoskeletal: No suspicious bone lesions identified. IMPRESSION: 1. Gallbladder is distended with wall thickening without cholelithiasis or dilated cystic duct, favored sequela of hepatocellular disease. If continued clinical concern for acute cholecystitis consider further evaluation with nuclear medicine HIDA scan. 2. No biliary ductal dilation or choledocholithiasis. 3. Hepatic cirrhosis and diffuse steatosis. No suspicious hepatic lesion, however postcontrast imaging is degraded by respiratory  motion. 4. Sequela of  portal hypertension including trace perihepatic ascites and splenomegaly. Electronically Signed   By: Dahlia Bailiff M.D.   On: 04/04/2021 20:46   US Abdomen Limited RUQ (LIVER/GB)  Result Date: 04/04/2021 CLINICAL DATA:  Jaundice EXAM: ULTRASOUND ABDOMEN LIMITED RIGHT UPPER QUADRANT COMPARISON:  MRI September 2022, CT abdomen pelvis June 2022 FINDINGS: Gallbladder: The gallbladder is slightly distended with wall thickening measuring 6 mm. No significant pericholecystic fluid appreciated. Negative sonographic Murphy's sign. Common bile duct: Diameter: 0.8 cm, stable in size when compared to recent CT in June 2022. Liver: Diffuse increased echogenicity. The liver appears enlarged. No focal liver lesion. Portal vein is patent on color Doppler imaging with normal direction of blood flow towards the liver. Other: None. IMPRESSION: 1. Distended gallbladder with wall thickening. No definite stones are identified, and negative sonographic Murphy's sign. Findings are equivocal for acute cholecystitis, and may represent reactive wall edema secondary to another process. Correlate clinically. 2. Prominent appearance of the common bile duct measuring up to 8 mm, which is stable when compared to recent CT in June 2022. 3. Enlarged appearance of the liver with diffuse increased echogenicity, suggestive of global hepatocellular dysfunction. Electronically Signed   By: Albin Felling M.D.   On: 04/04/2021 15:49  [2 weeks]   Assessment:  50 y/o male with history of biopsy-proven alcohol associated cirrhosis, previously complicated by ascites requiring paracentesis and hepatic encephalopathy in the setting of acute on chronic alcohol associated hepatitis.  Last admission for hepatic decompensation in May 2021.  Patient currently followed by Dr. Tarri Glenn with [facility], and sees Roosevelt Locks, NP at [facility] yearly. He presents this admission with complaints of left upper abdominal pain, jaundice which he noted over a week  ago.  Symptoms associated with fatigue and fever.  He suspected symptoms were brought on by recent anesthesia on December 1 for upper endoscopy.  He states this is the third episode of jaundice, hepatic decompensation which seem to occur after anesthesia.  First 2 episodes occurred in March and April 2021 as outlined above.  He reports last alcohol consumption over 4 weeks ago.  Jaundice/cirrhosis: In the setting of alcohol-related cirrhosis.  Last alcohol consumption over 4 weeks ago, does not exclude possibility of alcoholic hepatitis.  Interestingly, he relates his hepatic decompensation to anesthesia events.  March 2021, he had visible jaundice at time of colonoscopy with recent prior labs reflecting alcoholic hepatitis but required admission for further acute decompensation with hepatic encephalopathy, ascites, lower extremity edema.  At that time there had been concerned about drug-induced hepatitis (ketamine/propofol).  Similar occurrence in April 2021.  This admission, right upper quadrant ultrasound and MRCP completed.  Initially ultrasound with stable CBD diameter of 8 mm compared to CT in March.  Liver enlarged, portal vein patent on Doppler.  Gallbladder slightly distended with wall thickening of 6 mm but no pericholecystic fluid or Murphy sign.  MRCP with no gallstones, CBD 4 mm, splenomegaly, suspected gallbladder distention with wall thickening related to sequela of hepatocellular disease.  Does not appear to have obstructive process. COVID/infection may have contributed to progressive cholestasis.   On April 03, 2021, MELD Na was 25 and DF was 29 (using PT control of 13).  Yesterday MELD Na 24.  DF 38.2.  Today MELD Na 25. DF 37 today. His PT improved slightly from 17.9 to 17.4 over last 24 hours, hopefully started to trend downward. His T bili crept up a bit from 15.7 to 16.8. AST/AP stable.  LUQ pain: Possible musculoskeletal. Possibly due to enlarged left hepatic lobe. Lipase minimally  elevated without abnormality of the pancreas on MRI.  Doubt pancreatitis.  New finding of mild splenomegaly, unlikely to be contributing.  Recent EGD as outlined previously with portal gastropathy, gastritis, duodenitis, again unlikely to be contributing to his symptoms. Patient was concerned about left upper quadrant quarter sized bruise with central "incision like" lesion that occurred at time of his recent upper endoscopy.  Site appears to be healing without any obvious abnormalities on MRI. Etiology unclear, patient initially thought it was part of the endoscopy therefore did not question the finding.  UTI: Suspected based on urinalysis.  Patient completed three days of Rocephin.  Urine culture negative, final.  COVID: Febrile on presentation.  Has remained afebrile since admission.  No shortness of breath or cough.  Currently on molnupiravir.   Melena: chronic, likely not real given normal Hgb.   Plan: Titrate lactulose for 3 soft BMs daily.  Continue Xifaxan 550 mg twice daily. Follow-up pending AFP. Replete folate.  Avoid acetaminophen.  On threshold for starting prednisolone. Urine culture negative. Rocephin has been discontinued. Blood cultures negative but not final. Overall his DF is slightly better but remains >32. Slight improvement in PT/INR but Tbili continues to slowly rise. Will discuss with Dr. Abbey Chatters regarding possible prednisolone.   Laureen Ochs. Bernarda Caffey [facility] [phone] 12/29/202210:34 AM    LOS: 2 days    Addendum: Discussed with Dr. Abbey Chatters. UTI has been treated. Urine culture negative. Blood cultures remain negative. No respiratory symptoms from COVID standpoint. CXR with no acute disease at time of admission. No contraindication to steroids in setting of COVID (discussed with Dr. Roxan Hockey). Will start prednisolone 5m daily. He will need to have CMET, PT/INR rechecked in one week. If improving, then will plan for 4 weeks of  prednisolone at 456mdaily, then rapid taper over 2 weeks (total of 6 weeks). He will need to follow up with his primary GI, Dr. BeTarri Glennut will be happy to assist until he is able to have her take over his care. Please note, prednisolone is preferred over prednisone, but if cost prohibited, then prednisone can be substituted. Ideally would keep one more day to trend Tbili, PT/INR.  LeLaureen OchsLeBernarda [facility]affeyoBell[facility] [phone]/29/20221:39 PM

## 2021-04-06 NOTE — Telephone Encounter (Signed)
New labs have been ordered and lft has been cancelled. New orders put in the mail.

## 2021-04-06 NOTE — Telephone Encounter (Signed)
Patient likely to be discharged in the next 24 hours. Started prednisolone today for suspected etoh hepatitis. He is usually followed by Dr. Tarri Glenn at [place]. Until he is able to get in to be seen or have his care resumed by Dr. Tarri Glenn, we will need to continue outpatient management since we are starting him on steroid course.   Patient will need LFTs in one week to determine if prednisolone is working. Please arrange with [place].

## 2021-04-06 NOTE — Telephone Encounter (Signed)
Pt was made aware and lab orders were put in the mail with a reminder note of when to have done.

## 2021-04-06 NOTE — Addendum Note (Signed)
Addended by: Orland Jarred on: 04/06/2021 03:57 PM   Modules accepted: Orders

## 2021-04-07 ENCOUNTER — Telehealth: Payer: Self-pay

## 2021-04-07 ENCOUNTER — Other Ambulatory Visit (INDEPENDENT_AMBULATORY_CARE_PROVIDER_SITE_OTHER): Payer: Self-pay | Admitting: Gastroenterology

## 2021-04-07 ENCOUNTER — Other Ambulatory Visit: Payer: Self-pay | Admitting: Family Medicine

## 2021-04-07 DIAGNOSIS — R1012 Left upper quadrant pain: Secondary | ICD-10-CM | POA: Diagnosis not present

## 2021-04-07 DIAGNOSIS — R17 Unspecified jaundice: Secondary | ICD-10-CM | POA: Diagnosis not present

## 2021-04-07 DIAGNOSIS — K7010 Alcoholic hepatitis without ascites: Secondary | ICD-10-CM | POA: Diagnosis not present

## 2021-04-07 LAB — COMPREHENSIVE METABOLIC PANEL
ALT: 28 U/L (ref 0–44)
AST: 105 U/L — ABNORMAL HIGH (ref 15–41)
Albumin: 2.2 g/dL — ABNORMAL LOW (ref 3.5–5.0)
Alkaline Phosphatase: 203 U/L — ABNORMAL HIGH (ref 38–126)
Anion gap: 7 (ref 5–15)
BUN: 6 mg/dL (ref 6–20)
CO2: 19 mmol/L — ABNORMAL LOW (ref 22–32)
Calcium: 7.7 mg/dL — ABNORMAL LOW (ref 8.9–10.3)
Chloride: 106 mmol/L (ref 98–111)
Creatinine, Ser: <0.3 mg/dL — ABNORMAL LOW (ref 0.61–1.24)
Glucose, Bld: 125 mg/dL — ABNORMAL HIGH (ref 70–99)
Potassium: 3.7 mmol/L (ref 3.5–5.1)
Sodium: 132 mmol/L — ABNORMAL LOW (ref 135–145)
Total Bilirubin: 16.8 mg/dL — ABNORMAL HIGH (ref 0.3–1.2)
Total Protein: 5.6 g/dL — ABNORMAL LOW (ref 6.5–8.1)

## 2021-04-07 LAB — CBC
HCT: 36.3 % — ABNORMAL LOW (ref 39.0–52.0)
Hemoglobin: 12.7 g/dL — ABNORMAL LOW (ref 13.0–17.0)
MCH: 36.2 pg — ABNORMAL HIGH (ref 26.0–34.0)
MCHC: 35 g/dL (ref 30.0–36.0)
MCV: 103.4 fL — ABNORMAL HIGH (ref 80.0–100.0)
Platelets: 165 10*3/uL (ref 150–400)
RBC: 3.51 MIL/uL — ABNORMAL LOW (ref 4.22–5.81)
RDW: 18.4 % — ABNORMAL HIGH (ref 11.5–15.5)
WBC: 6.2 10*3/uL (ref 4.0–10.5)
nRBC: 0 % (ref 0.0–0.2)

## 2021-04-07 LAB — AFP TUMOR MARKER: AFP, Serum, Tumor Marker: 2.2 ng/mL (ref 0.0–6.9)

## 2021-04-07 LAB — PROTIME-INR
INR: 1.5 — ABNORMAL HIGH (ref 0.8–1.2)
Prothrombin Time: 18.4 seconds — ABNORMAL HIGH (ref 11.4–15.2)

## 2021-04-07 MED ORDER — THIAMINE HCL 100 MG PO TABS: 100.0000 mg | ORAL_TABLET | Freq: Every day | ORAL | 2 refills | Status: DC

## 2021-04-07 MED ORDER — LACTULOSE 10 GM/15ML PO SOLN: 10.0000 g | Freq: Every day | ORAL | 2 refills | Status: DC

## 2021-04-07 MED ORDER — MULTI-VITAMIN/MINERALS PO TABS: 1.0000 | ORAL_TABLET | Freq: Every day | ORAL | 2 refills | Status: DC

## 2021-04-07 MED ORDER — MOLNUPIRAVIR EUA 200MG CAPSULE
4.0000 | ORAL_CAPSULE | Freq: Two times a day (BID) | ORAL | 0 refills | Status: AC
Start: 2021-04-07 — End: 2021-04-12

## 2021-04-07 MED ORDER — FOLIC ACID 1 MG PO TABS: 1.0000 mg | ORAL_TABLET | Freq: Every day | ORAL | 3 refills | Status: DC

## 2021-04-07 MED ORDER — PREDNISONE 20 MG PO TABS: 40.0000 mg | ORAL_TABLET | Freq: Every day | ORAL | 0 refills | Status: DC

## 2021-04-07 MED ORDER — ONDANSETRON HCL 4 MG PO TABS: 4.0000 mg | ORAL_TABLET | Freq: Four times a day (QID) | ORAL | 0 refills | Status: DC | PRN

## 2021-04-07 MED ORDER — PREDNISOLONE 5 MG PO TABS: 40.0000 mg | ORAL_TABLET | Freq: Every day | ORAL | 0 refills | Status: DC

## 2021-04-07 NOTE — Telephone Encounter (Signed)
Pt discharged from hospital today due to UTI, Jaundice and Acute Hepatitis. Pt's wife called stating they tried to pick up the patient's Prednisolone Rx and the insurance will not cover it unless you call the insurance for a prior authorization. The number is [phone].   Pt requested if an Rx for a pain medication can be called in for him. Pt states that his abdomen is very sore and asks if he only needs 4-5 days worth.  Thank you.

## 2021-04-07 NOTE — Telephone Encounter (Signed)
Transition Care Management Follow-up Telephone Call Date of discharge and from where: 04/07/21 [facility] Diagnosis: UTI, Jaundice, Acute Hepatitis. How have you been since you were released from the hospital? Pt states he is very sore from injections in his stomach but other than that doing okay. Any questions or concerns? No  Items Reviewed: Did the pt receive and understand the discharge instructions provided? Yes  Medications obtained and verified?  Issues with getting Prednisolone due to insurance needing PA. Other? No  Any new allergies since your discharge? No  Dietary orders reviewed? No Do you have support at home? Yes   Home Care and Equipment/Supplies: Were home health services ordered? no If so, what is the name of the agency? N/A  Has the agency set up a time to come to the patient's home? not applicable Were any new equipment or medical supplies ordered?  No What is the name of the medical supply agency? N/A Were you able to get the supplies/equipment? not applicable Do you have any questions related to the use of the equipment or supplies? No  Functional Questionnaire: (I = Independent and D = Dependent) ADLs: I  Bathing/Dressing- I  Meal Prep- I  Eating- I  Maintaining continence- I  Transferring/Ambulation- I  Managing Meds- I  Follow up appointments reviewed:  PCP Hospital f/u appt confirmed? Yes  Scheduled to see Dr. Dennard Schaumann on 04/14/2020 @ 3:30. [facility] f/u appt confirmed? No   Are transportation arrangements needed? No  If their condition worsens, is the pt aware to call PCP or go to the Emergency Dept.? Yes Was the patient provided with contact information for the PCP's office or ED? Yes Was to pt encouraged to call back with questions or concerns? Yes

## 2021-04-07 NOTE — Discharge Summary (Signed)
°                                                                                ° ° °Christopher Carrillo, is a 50 y.o. male  DOB 09/01/1970  MRN [id number]. ° °Admission date:  04/03/2021  Admitting Physician  Asia B Zierle-Ghosh, DO ° °Discharge Date:  04/07/2021  ° °Primary MD  Pickard, Warren T, MD ° °Recommendations for primary care physician for things to follow:  ° °1)Continue Prednisolone 40mg daily x28 days,  decreasing by 10mg each week and should be tapered/weaned off  in about 4 weeks ° °2)Complete Abstinence from Alcohol Advised, patient or inpatient alcohol rehab programs advised ° °3)Follow up with Dr. Rehman-- address [address], [address], [place] [address],,Phone Number [phone]  in 1 week for repeat CBC, CMP and PT/INR blood tests and Re-evaluation ° °4)Avoid acetaminophen and NSAIDs ° °5)Avoid ibuprofen/Advil/Aleve/Motrin/Goody Powders/Naproxen/BC powders/Meloxicam/Diclofenac/Indomethacin and other Nonsteroidal anti-inflammatory medications as these will make you more likely to bleed and can cause stomach ulcers, can also cause Kidney problems. ° ° °Admission Diagnosis  Lower urinary tract infectious disease [N39.0] °Jaundice [R17] °Alcoholic hepatitis, unspecified whether ascites present [K70.10] °COVID-19 [U07.1] ° ° °Discharge Diagnosis  Lower urinary tract infectious disease [N39.0] °Jaundice [R17] °Alcoholic hepatitis, unspecified whether ascites present [K70.10] °COVID-19 [U07.1]   ° °Principal Problem: °  Jaundice °Active Problems: °  Tobacco use °  Alcoholic hepatitis °  COVID-19 °    ° °Past Medical History:  °Diagnosis Date  ° Abdominal pain   ° Alcoholic hepatitis without ascites   ° Anemia   ° Anxiety   ° Arthritis   ° Elevated bilirubin   ° Essential hypertension   ° Psoriasis   ° Transaminitis   ° ° °Past Surgical History:  °Procedure Laterality Date  ° BIOPSY  07/20/2019  ° Procedure: BIOPSY;  Surgeon: Rourk, Robert M, MD;  [facility];  Service: Endoscopy;;   ° BIOPSY  08/14/2019  ° Procedure: BIOPSY;  Surgeon: Beavers, Kimberly, MD;  [facility]: [facility];  Service: Gastroenterology;;  ° COLONOSCOPY    ° COLONOSCOPY WITH PROPOFOL N/A 07/20/2019  ° Procedure: COLONOSCOPY WITH PROPOFOL;  Surgeon: Rourk, Robert M, MD;  [facility];  Service: Endoscopy;  Laterality: N/A;  10:15am  ° COLONOSCOPY WITH PROPOFOL N/A 08/14/2019  ° Procedure: COLONOSCOPY WITH PROPOFOL;  Surgeon: Beavers, Kimberly, MD;  [facility];  Service: Gastroenterology;  Laterality: N/A;  ° IR PARACENTESIS  08/14/2019  ° POLYPECTOMY  07/20/2019  ° Procedure: POLYPECTOMY;  Surgeon: Rourk, Robert M, MD;  [facility];  Service: Endoscopy;;  ° SUBMUCOSAL TATTOO INJECTION  08/14/2019  ° Procedure: SUBMUCOSAL TATTOO INJECTION;  Surgeon: Beavers, Kimberly, MD;  [facility];  Service: Gastroenterology;;  ° ° ° ° ° HPI  from the history and physical done on the day of admission:  ° ° Christopher Carrillo  is a 50 y.o. male, with history of alcoholic hepatitis, anxiety, essential hypertension, psoriasis, and more presents the ED with a chief complaint of stomach pain and jaundice.  Patient reports that started approximately 1 week ago.  1 week prior to that he had had a colonoscopy   with conscious sedation.  Patient reports that this would be the third time he has become jaundice after having anesthesia.  Patient reports that been getting worse since it started.  Today his urine became dark.  He describes it as a color between coffee and tea.  He had some left lower quadrant pain.  He reports loose stools but he does take lactulose.  He tries to take his lactulose every day but does not always get to as he believes it interferes with his outings.  The last time he took it was the day prior to presentation.  Patient reports he started noticing his jaundice 4-5 days ago.  Been gradually worse.  Patient does still have his gallbladder.  Patient describes his abdominal pain as a  dull ache.  Laying on that side or palpating it makes it more like a sharp pain.  Patient reports its been 1 month since he had alcohol.  He reports being fevers but no chills.  He did have a fever in the ED.  Patient does have some slight dysuria as well.  He tries to drink 65-100 ounces of water per day but reports that recently it might of been a little decreased. °  °Patient is a current smoker one third of a pack per day.  He has not had a drink in a month.  He does not use illicit drugs.  Patient is full code. °  °In the ED °Patient is febrile 101.8, heart rate 104-109, respiratory rate 18, blood pressure 132/67, satting 100% °No leukocytosis with white blood cell count of 8.8, hemoglobin 14.3 °Potassium 3.3, sodium 129, alk phos 297, albumin 2.9, AST 229, T bili 15.2, ammonia 71 °Chest x-ray shows no active disease °COVID-positive °Blood cultures pending °Alcohol level less than 10 °UA suspicious for UTI °Urine culture pending °Admission requested for further evaluation and treatment of jaundice ° ° ° ° Hospital Course:  ° °  °Brief Narrative:  °50 y.o. male, with history of alcoholic liver cirrhosis with portal hypertension and anxiety, essential hypertension, psoriasis admitted on 04/04/2021 with elevated LFTs and COVID-19 infection °-GI service initiated prednisolone on 04/06/2021  ° °A/p °Hyperbilirubinemia/alcoholic liver cirrhosis °-Patient has biopsy-proven alcoholic cirrhosis °-He has cut back on alcoholic intake but did not actually quit per se  °-Imaging studies including gallbladder ultrasound and MRCP consistent with alcoholic liver cirrhosis with diffuse steatosis °- did show distended gallbladder with wall thickening.  No definite stones were identified.  CBD measured up to 8 mm. °-Trace ascites noted °-Acute viral hepatitis panel noted to be unremarkable °-GI consult and input appreciated °Repeat labs for MELD, DF, platelets °Patient is able to °-Lactulose adjusted by GI service,  °-Continue  rifaximin °--GI service initiated prednisolone on 04/06/2021  °-Continue Prednisolone 40mg daily x28 days,  decreasing by 10mg each week and should be tapered/weaned off  in about 4 weeks ° °2)COVID-19 infection °-No further fevers and respiratory symptoms at this time °-No hypoxia, radiologically no COVID-pneumonia °-He reports that he did receive 2 Moderna vaccines, but has not had a booster.  Last vaccination was approximately a year ago. °-Okay to complete molnupiravir, since he does have underlying comorbidities. °  °Possible UTI °--Initially treated with Rocephin, urine culture NGTD no further antibiotics needed °  °Hyponatremia--- sodium currently around 132, suspect related to underlying liver disease --- cannot rule out some component of beer potomania ° °- °Elevated lipase-----mild elevated lipase noted clinically and radiologically no evidence of significant pancreatitis at this time °  °-  Hypokalemia suspect due to lactulose induced diarrhea--normalized with replacement ° °Alcohol abuse---  °-Patient admitted to GI midlevel practitioner Ms.Carlan that he did cut down his drinking but never really stopped drinking per se,  °-patient states that his last alcoholic intake was about a week prior to admission according to patient °-Complete abstinence from alcohol and alcohol rehab programs advised, ° °Disposition--- Home °  °Disposition: The patient is from: Home °             Anticipated d/c is to: Home °              °Code Status :  -  Code Status: Full Code  °  °Family Communication:    NA (patient is alert, awake and coherent)  °  °Consults  :  Gi °Discharge Condition: stable ° °Follow UP ° ° Follow-up Information   ° ° Pickard, Warren T, MD. Schedule an appointment as soon as possible for a visit in 1 month(s).   °Specialty: Family Medicine °Contact information: °[address] °[place] [address] °[phone] ° ° °  °  ° ° Rehman, Najeeb U, MD Follow up in 1 week(s).   °Specialty:  Gastroenterology °Contact information: °[address], [address] °[place] [address] °[phone] ° ° °  °  ° °  °  ° °  °  °Diet and Activity recommendation:  As advised ° °Discharge Instructions   ° °Discharge Instructions   ° ° Call MD for:  difficulty breathing, headache or visual disturbances   Complete by: As directed °  ° Call MD for:  persistant dizziness or light-headedness   Complete by: As directed °  ° Call MD for:  persistant nausea and vomiting   Complete by: As directed °  ° Call MD for:  severe uncontrolled pain   Complete by: As directed °  ° Call MD for:  temperature >100.4   Complete by: As directed °  ° Diet - low sodium heart healthy   Complete by: As directed °  ° Discharge instructions   Complete by: As directed °  ° 1)Continue Prednisolone 40mg daily x28 days,  decreasing by 10mg each week and should be tapered/weaned off  in about 4 weeks ° °2)Complete Abstinence from Alcohol Advised, patient or inpatient alcohol rehab programs advised ° °3)Follow up with Dr. Rehman-- address [address], [address], [place] [address],,Phone Number [phone]  in 1 week for repeat CBC, CMP and PT/INR blood tests and Re-evaluation ° °4)Avoid acetaminophen and NSAIDs ° °5)Avoid ibuprofen/Advil/Aleve/Motrin/Goody Powders/Naproxen/BC powders/Meloxicam/Diclofenac/Indomethacin and other Nonsteroidal anti-inflammatory medications as these will make you more likely to bleed and can cause stomach ulcers, can also cause Kidney problems.  ° Increase activity slowly   Complete by: As directed °  ° °  ° ° ° ° Discharge Medications  ° °  °Allergies as of 04/07/2021   °No Known Allergies °  ° °  °Medication List  °  ° °TAKE these medications   ° °folic acid 1 MG tablet °Commonly known as: FOLVITE °Take 1 tablet (1 mg total) by mouth daily. °Start taking on: April 08, 2021 °  °lactulose 10 GM/15ML solution °Commonly known as: CHRONULAC °Take 15 mLs (10 g total) by mouth daily. °Start taking on: April 08, 2021 °What changed: when to take this °  °molnupiravir EUA 200 mg Caps capsule °Commonly known as: LAGEVRIO °Take 4 capsules (800 mg total) by mouth 2 (two) times daily for 5 days. °  °  multivitamin with minerals tablet Take 1 tablet by mouth daily.   ondansetron 4 MG tablet Commonly known as: ZOFRAN Take 1 tablet (4 mg total) by mouth every 6 (six) hours as needed for nausea. What changed:  how much to take when to take this reasons to take this   prednisoLONE 5 MG Tabs tablet Take 8 tablets (40 mg total) by mouth daily. Continue Prednisolone 103m daily x28 days,  decreasing by 162meach week and should be tapered/weaned off  in about 4 weeks Start taking on: April 08, 2021   rifaximin 550 MG Tabs tablet Commonly known as: XIFAXAN Take 1 tablet (550 mg total) by mouth 2 (two) times daily.   thiamine 100 MG tablet Take 1 tablet (100 mg total) by mouth daily.        Major procedures and Radiology Reports - PLEASE review detailed and final reports for all details, in brief -   MR 3D Recon At Scanner  Result Date: 04/04/2021 CLINICAL DATA:  Jaundice, abnormal ultrasound. EXAM: MRI ABDOMEN WITHOUT AND WITH CONTRAST (INCLUDING MRCP) TECHNIQUE: Multiplanar multisequence MR imaging of the abdomen was performed both before and after the administration of intravenous contrast. Heavily T2-weighted images of the biliary and pancreatic ducts were obtained, and three-dimensional MRCP images were rendered by post processing. CONTRAST:  37m43mADAVIST GADOBUTROL 1 MMOL/ML IV SOLN COMPARISON:  Multiple priors including ultrasound from same day and MRI December 30, 2020. FINDINGS: Despite efforts by the technologist and patient, motion artifact is present on today's exam and could not be eliminated. This reduces exam sensitivity and specificity. Lower chest: No acute abnormality. Hepatobiliary: Diffuse hepatic steatosis with morphologic changes of cirrhosis. No suspicious hepatic lesion, however  postcontrast imaging is degraded by respiratory motion. Gallbladder is distended with wall thickening without cholelithiasis, favored sequela of hepatocellular disease. No biliary ductal dilation, the common duct measures 4 mm. Pancreas: No pancreatic ductal dilation or evidence of acute inflammation. Spleen:  Splenomegaly measuring 13.8 cm in maximum axial dimension. Adrenals/Urinary Tract: Bilateral adrenal glands are unremarkable. No hydronephrosis. No solid enhancing renal mass. Stomach/Bowel: Stomach is unremarkable for degree of distension. No pathologic dilation or evidence of acute inflammation involving loops of large or small bowel in the abdomen. Vascular/Lymphatic: No abdominal aortic aneurysm. The portal, splenic and superior mesenteric veins appear patent. Prominent upper abdominal lymph nodes are favored reactive. Other:  Trace perihepatic ascites. Musculoskeletal: No suspicious bone lesions identified. IMPRESSION: 1. Gallbladder is distended with wall thickening without cholelithiasis or dilated cystic duct, favored sequela of hepatocellular disease. If continued clinical concern for acute cholecystitis consider further evaluation with nuclear medicine HIDA scan. 2. No biliary ductal dilation or choledocholithiasis. 3. Hepatic cirrhosis and diffuse steatosis. No suspicious hepatic lesion, however postcontrast imaging is degraded by respiratory motion. 4. Sequela of portal hypertension including trace perihepatic ascites and splenomegaly. Electronically Signed   By: JefDahlia BailiffD.   On: 04/04/2021 20:46   DG Chest Portable 1 View  Result Date: 04/03/2021 CLINICAL DATA:  Fever, jaundice, palpitations and hematuria EXAM: PORTABLE CHEST 1 VIEW COMPARISON:  Aug 13, 2019. FINDINGS: The heart size and mediastinal contours are within normal limits. No focal consolidation. No visible pleural effusion or pneumothorax. The visualized skeletal structures are unremarkable. IMPRESSION: No active disease.  Electronically Signed   By: JefDahlia BailiffD.   On: 04/03/2021 22:24   MR ABDOMEN MRCP W WO CONTAST  Result Date: 04/04/2021 CLINICAL DATA:  Jaundice, abnormal ultrasound. EXAM: MRI ABDOMEN WITHOUT AND WITH CONTRAST (INCLUDING MRCP)  TECHNIQUE: Multiplanar multisequence MR imaging of the abdomen was performed both before and after the administration of intravenous contrast. Heavily T2-weighted images of the biliary and pancreatic ducts were obtained, and three-dimensional MRCP images were rendered by post processing. CONTRAST:  9mL GADAVIST GADOBUTROL 1 MMOL/ML IV SOLN COMPARISON:  Multiple priors including ultrasound from same day and MRI December 30, 2020. FINDINGS: Despite efforts by the technologist and patient, motion artifact is present on today's exam and could not be eliminated. This reduces exam sensitivity and specificity. Lower chest: No acute abnormality. Hepatobiliary: Diffuse hepatic steatosis with morphologic changes of cirrhosis. No suspicious hepatic lesion, however postcontrast imaging is degraded by respiratory motion. Gallbladder is distended with wall thickening without cholelithiasis, favored sequela of hepatocellular disease. No biliary ductal dilation, the common duct measures 4 mm. Pancreas: No pancreatic ductal dilation or evidence of acute inflammation. Spleen:  Splenomegaly measuring 13.8 cm in maximum axial dimension. Adrenals/Urinary Tract: Bilateral adrenal glands are unremarkable. No hydronephrosis. No solid enhancing renal mass. Stomach/Bowel: Stomach is unremarkable for degree of distension. No pathologic dilation or evidence of acute inflammation involving loops of large or small bowel in the abdomen. Vascular/Lymphatic: No abdominal aortic aneurysm. The portal, splenic and superior mesenteric veins appear patent. Prominent upper abdominal lymph nodes are favored reactive. Other:  Trace perihepatic ascites. Musculoskeletal: No suspicious bone lesions identified. IMPRESSION:  1. Gallbladder is distended with wall thickening without cholelithiasis or dilated cystic duct, favored sequela of hepatocellular disease. If continued clinical concern for acute cholecystitis consider further evaluation with nuclear medicine HIDA scan. 2. No biliary ductal dilation or choledocholithiasis. 3. Hepatic cirrhosis and diffuse steatosis. No suspicious hepatic lesion, however postcontrast imaging is degraded by respiratory motion. 4. Sequela of portal hypertension including trace perihepatic ascites and splenomegaly. Electronically Signed   By: Jeffrey  Waltz M.D.   On: 04/04/2021 20:46  ° °US Abdomen Limited RUQ (LIVER/GB) ° °Result Date: 04/04/2021 °CLINICAL DATA:  Jaundice EXAM: ULTRASOUND ABDOMEN LIMITED RIGHT UPPER QUADRANT COMPARISON:  MRI September 2022, CT abdomen pelvis June 2022 FINDINGS: Gallbladder: The gallbladder is slightly distended with wall thickening measuring 6 mm. No significant pericholecystic fluid appreciated. Negative sonographic Murphy's sign. Common bile duct: Diameter: 0.8 cm, stable in size when compared to recent CT in June 2022. Liver: Diffuse increased echogenicity. The liver appears enlarged. No focal liver lesion. Portal vein is patent on color Doppler imaging with normal direction of blood flow towards the liver. Other: None. IMPRESSION: 1. Distended gallbladder with wall thickening. No definite stones are identified, and negative sonographic Murphy's sign. Findings are equivocal for acute cholecystitis, and may represent reactive wall edema secondary to another process. Correlate clinically. 2. Prominent appearance of the common bile duct measuring up to 8 mm, which is stable when compared to recent CT in June 2022. 3. Enlarged appearance of the liver with diffuse increased echogenicity, suggestive of global hepatocellular dysfunction. Electronically Signed   By: Yasser  El-Abd M.D.   On: 04/04/2021 15:49   ° °Micro Results  ° °Recent Results (from the past 240 hour(s))   °Urine Culture     Status: None  ° Collection Time: 04/03/21  9:23 PM  ° Specimen: Urine, Clean Catch  °Result Value Ref Range Status  ° Specimen Description   Final  °  URINE, CLEAN CATCH °Performed at [facility], [address] [place], [place] [address] °  ° Special Requests   Final  °  NONE °Performed at [facility], [address] [place], [place] [address] °  ° Culture     Final    NO GROWTH Performed at [facility], [place] [address], [place], [place] [address]    Report Status 04/05/2021 FINAL  Final  Culture, blood (routine x 2)     Status: None (Preliminary result)   Collection Time: 04/03/21 10:12 PM   Specimen: BLOOD RIGHT HAND  Result Value Ref Range Status   Specimen Description BLOOD RIGHT HAND  Final   Special Requests   Final    BOTTLES DRAWN AEROBIC AND ANAEROBIC Blood Culture adequate volume   Culture   Final    NO GROWTH 4 DAYS Performed at [facility], [address] [place], [place] [address]    Report Status PENDING  Incomplete  Culture, blood (routine x 2)     Status: None (Preliminary result)   Collection Time: 04/03/21 10:12 PM   Specimen: BLOOD LEFT HAND  Result Value Ref Range Status   Specimen Description BLOOD LEFT HAND  Final   Special Requests   Final    BOTTLES DRAWN AEROBIC AND ANAEROBIC Blood Culture adequate volume   Culture   Final    NO GROWTH 4 DAYS Performed at [facility], [address], [place], [place] [address]    Report Status PENDING  Incomplete  Resp Panel by RT-PCR (Flu A&B, Covid) Nasopharyngeal Swab     Status: Abnormal   Collection Time: 04/03/21 10:20 PM   Specimen: Nasopharyngeal Swab; Nasopharyngeal(NP) swabs in vial transport medium  Result Value Ref Range Status   SARS Coronavirus 2 by RT PCR POSITIVE (A) NEGATIVE Final    Comment: (NOTE) SARS-CoV-2 target nucleic acids are DETECTED.  The SARS-CoV-2 RNA is generally detectable in upper respiratory specimens during the acute phase of infection. Positive results  are indicative of the presence of the identified virus, but do not rule out bacterial infection or co-infection with other pathogens not detected by the test. Clinical correlation with patient history and other diagnostic information is necessary to determine patient infection status. The expected result is Negative.  Fact Sheet for Patients: EntrepreneurPulse.com.au  Fact Sheet for Healthcare Providers: IncredibleEmployment.be  This test is not yet approved or cleared by the [place] FDA and  has been authorized for detection and/or diagnosis of SARS-CoV-2 by FDA under an Emergency Use Authorization (EUA).  This EUA will remain in effect (meaning this test can be used) for the duration of  the COVID-19 declaration under Section 564(b)(1) of the A ct, 21 U.S.C. section 360bbb-3(b)(1), unless the authorization is terminated or revoked sooner.     Influenza A by PCR NEGATIVE NEGATIVE Final   Influenza B by PCR NEGATIVE NEGATIVE Final    Comment: (NOTE) The Xpert Xpress SARS-CoV-2/FLU/RSV plus assay is intended as an aid in the diagnosis of influenza from Nasopharyngeal swab specimens and should not be used as a sole basis for treatment. Nasal washings and aspirates are unacceptable for Xpert Xpress SARS-CoV-2/FLU/RSV testing.  Fact Sheet for Patients: EntrepreneurPulse.com.au  Fact Sheet for Healthcare Providers: IncredibleEmployment.be  This test is not yet approved or cleared by the [place] FDA and has been authorized for detection and/or diagnosis of SARS-CoV-2 by FDA under an Emergency Use Authorization (EUA). This EUA will remain in effect (meaning this test can be used) for the duration of the COVID-19 declaration under Section 564(b)(1) of the Act, 21 U.S.C. section 360bbb-3(b)(1), unless the authorization is terminated or revoked.  Performed at [facility], [address],  [place], [place] [address]    Today   Subjective  Standley Ma today has no additional new complaints °-Skipped lactulose due to loose stools °-GI midlevel practitioner, Ms Carlan  at bedside °-Dr Rehman stopped in  as well ° ° Patient has been seen and examined prior to discharge °  °Objective  ° °Blood pressure 128/78, pulse 91, temperature (!) 97.5 °F (36.4 °C), temperature source Oral, resp. rate 18, height 5' 8" (1.727 m), weight 91.2 kg, SpO2 98 %. ° ° °Intake/Output Summary (Last 24 hours) at 04/07/2021 1357 °Last data filed at 04/07/2021 1138 °Gross per 24 hour  °Intake 960 ml  °Output 750 ml  °Net 210 ml  ° ° °Exam °Gen:- Awake Alert,  in no acute distress °HEENT:- [place].AT,  +VE sclera icterus °Neck-Supple Neck,No JVD,.  °Lungs-  CTAB , fair symmetrical air movement °CV- S1, S2 normal, regular  °Abd-  +ve B.Sounds, Abd Soft, improved left upper quadrant tenderness without rebound or guarding, increased truncal adiposity °Extremity/Skin:- No  edema, pedal pulses present  °Psych-affect is appropriate, oriented x3, °Neuro-generalized weakness, no new focal deficits, no tremors ° ° Data Review  ° °CBC w Diff:  °Lab Results  °Component Value Date  ° WBC 6.2 04/07/2021  ° HGB 12.7 (L) 04/07/2021  ° HCT 36.3 (L) 04/07/2021  ° HCT 35.0 (L) 07/23/2019  ° PLT 165 04/07/2021  ° LYMPHOPCT 19 09/25/2020  ° BANDSPCT 5 09/25/2020  ° MONOPCT 12.4 03/28/2021  ° EOSPCT 0.3 03/28/2021  ° BASOPCT 1.1 03/28/2021  ° ° °CMP:  °Lab Results  °Component Value Date  ° NA 132 (L) 04/07/2021  ° K 3.7 04/07/2021  ° CL 106 04/07/2021  ° CO2 19 (L) 04/07/2021  ° BUN 6 04/07/2021  ° CREATININE <0.30 (L) 04/07/2021  ° CREATININE 0.55 (L) 03/30/2021  ° PROT 5.6 (L) 04/07/2021  ° ALBUMIN 2.2 (L) 04/07/2021  ° BILITOT 16.8 (H) 04/07/2021  ° ALKPHOS 203 (H) 04/07/2021  ° AST 105 (H) 04/07/2021  ° ALT 28 04/07/2021  °. °Total Discharge time is about 33 minutes ° °  M.D on 04/07/2021 at 1:57 PM ° °Go to [email or web address] -  for  contact info ° °[facility] - Office  [phone] ° ° °

## 2021-04-07 NOTE — Discharge Instructions (Addendum)
1)Continue Prednisolone 41m daily x28 days,  decreasing by 184meach week and should be tapered/weaned off  in about 4 weeks  2)Complete Abstinence from Alcohol Advised, patient or inpatient alcohol rehab programs advised  3)Follow up with Dr. ReLaural Golden address [phone], [id number],,[place] [phone] 1 week for repeat CBC, CMP and PT/INR blood tests and Re-evaluation  4)Avoid acetaminophen and NSAIDs  5)Avoid ibuprofen/Advil/Aleve/Motrin/Goody Powders/Naproxen/BC powders/Meloxicam/Diclofenac/Indomethacin and other Nonsteroidal anti-inflammatory medications as these will make you more likely to bleed and can cause stomach ulcers, can also cause Kidney problems.

## 2021-04-07 NOTE — Progress Notes (Signed)
Nsg Discharge Note  Admit Date:  04/03/2021 Discharge date: 04/07/2021   Asante Blanda to be D/C'd Home per MD order.  AVS completed.   Patient/caregiver able to verbalize understanding.  Discharge Medication: Allergies as of 04/07/2021   No Known Allergies      Medication List     TAKE these medications    folic acid 1 MG tablet Commonly known as: FOLVITE Take 1 tablet (1 mg total) by mouth daily. Start taking on: April 08, 2021   lactulose 10 GM/15ML solution Commonly known as: CHRONULAC Take 15 mLs (10 g total) by mouth daily. Start taking on: April 08, 2021 What changed: when to take this   molnupiravir EUA 200 mg Caps capsule Commonly known as: LAGEVRIO Take 4 capsules (800 mg total) by mouth 2 (two) times daily for 5 days.   multivitamin with minerals tablet Take 1 tablet by mouth daily.   ondansetron 4 MG tablet Commonly known as: ZOFRAN Take 1 tablet (4 mg total) by mouth every 6 (six) hours as needed for nausea. What changed:  how much to take when to take this reasons to take this   prednisoLONE 5 MG Tabs tablet Take 8 tablets (40 mg total) by mouth daily. Continue Prednisolone 46m daily x28 days,  decreasing by 15meach week and should be tapered/weaned off  in about 4 weeks Start taking on: April 08, 2021   rifaximin 550 MG Tabs tablet Commonly known as: XIFAXAN Take 1 tablet (550 mg total) by mouth 2 (two) times daily.   thiamine 100 MG tablet Take 1 tablet (100 mg total) by mouth daily.        Discharge Assessment: Vitals:   04/06/21 2123 04/07/21 0502  BP: 116/67 128/78  Pulse: 81 91  Resp: 20 18  Temp: 98.3 F (36.8 C) (!) 97.5 F (36.4 C)  SpO2: 98% 98%   Skin clean, dry and intact without evidence of skin break down, no evidence of skin tears noted. IV catheter discontinued intact. Site without signs and symptoms of complications - no redness or edema noted at insertion site, patient denies c/o pain - only slight  tenderness at site.  Dressing with slight pressure applied.  D/c Instructions-Education: Discharge instructions given to patient/family with verbalized understanding. D/c education completed with patient/family including follow up instructions, medication list, d/c activities limitations if indicated, with other d/c instructions as indicated by MD - patient able to verbalize understanding, all questions fully answered. Patient instructed to return to ED, call 911, or call MD for any changes in condition.  [place] [facility] [place].  JuKathie RhodesRN 04/07/2021 1:51 PM

## 2021-04-07 NOTE — Progress Notes (Signed)
Subjective: Patient states he is not doing well today, having pain to lower abdomen where he has bruising from lovenox injections and continues to have LUQ pain. States hospitalist cut down his pain medications yesterday. He reports that his appetite is starting to increase. Nausea and vomiting has improved some, however, he thinks this is related to not taking his lactulose, reports 8-9 BMs yesterday. Had 1 BM this morning. States that last drink was over 1 week ago, had a small flight of craft beer, reports last time he had heavier alcohol amount was about 1 month ago. States he has not had liquor, in maybe 6-7, months.  Objective: Vital signs in last 24 hours: Temp:  [97.5 F (36.4 C)-98.3 F (36.8 C)] 97.5 F (36.4 C) (12/30 0502) Pulse Rate:  [81-92] 91 (12/30 0502) Resp:  [16-20] 18 (12/30 0502) BP: (113-128)/(65-78) 128/78 (12/30 0502) SpO2:  [98 %] 98 % (12/30 0502) Last BM Date: 04/06/21 General:   Alert and oriented, pleasant Head:  Normocephalic and atraumatic. Eyes:  scleral icterus Mouth:  Without lesions, mucosa pink and moist.   Heart:  S1, S2 present, no murmurs noted.  Lungs: Clear to auscultation bilaterally, without wheezing, rales, or rhonchi.  Abdomen:  Bowel sounds present,distended but not taut. No HSM or hernias noted. No rebound or guarding. No masses appreciated. Purple bruising noted to lower abdomen. Msk:  Symmetrical without gross deformities. Normal posture. Extremities:  Without clubbing or edema. Neurologic:  Alert and  oriented x4;  grossly normal neurologically. Skin:  Warm and dry, intact without significant lesions. jaundice Psych:  Alert and cooperative. Normal mood and affect.  Intake/Output from previous day: 12/29 0701 - 12/30 0700 In: 480 [P.O.:480] Out: 750 [Urine:750] Intake/Output this shift: No intake/output data recorded.  Lab Results: Recent Labs    04/05/21 0604 04/07/21 0607  WBC 7.2 6.2  HGB 13.1 12.7*  HCT 37.1* 36.3*  PLT  129* 165   BMET Recent Labs    04/05/21 0604 04/06/21 0621 04/07/21 0607  NA 132* 131* 132*  K 3.5 3.3* 3.7  CL 105 105 106  CO2 19* 16* 19*  GLUCOSE 84 89 125*  BUN 7 6 6   CREATININE <0.30* <0.30* <0.30*  CALCIUM 7.9* 7.8* 7.7*   LFT Recent Labs    04/05/21 0604 04/06/21 0621 04/07/21 0607  PROT 6.2* 6.0* 5.6*  ALBUMIN 2.5* 2.4* 2.2*  AST 148* 123* 105*  ALT 29 25 28   ALKPHOS 231* 224* 203*  BILITOT 15.7* 16.8* 16.8*   PT/INR Recent Labs    04/06/21 0621 04/07/21 0607  LABPROT 17.4* 18.4*  INR 1.4* 1.5*    Assessment: 50 year old male with history of biopsy proven alcohol assocaited cirrhosis, previously complicated by ascites requiring paracentesis and hepatic encephalopathy in the setting of acute on chronic alcohol associated hepatitis. Last admission for hepatic decomp was in may 2021. Currently followed by Dr. Tarri Glenn with [facility], sees Roosevelt Locks NP at [facility], yearly. He presented this admission with c/o left upper abdominal pain, jaundice x1 week, fatigue, fever. He felt his symptoms may have been brought on by recent anesthesia on 03/09/21 for EGD, reports 3 previous episodes of jaundice and hepatic decompensation that seem to occur after anesthesia. First two episodes were in March and April 2021. Last alcohol consumption was reportedly over 4 weeks ago.   Jaundice/Alcoholic Hepatitis/cirrhosis: biliary obstruction ruled out via MRCP this admission.patient started on prednisolone yesterday for suspected alcoholic hepatitis, in setting of continued alcohol consumption. DF yesterday  was 28, which had improved slightly from DF of 38.2 on Wednesday. Prednisolone 60m started yesterday, initially held until UTI was treated, DF today: 41.6 (control 13).  T bili had increased to 16.8. and remains at this today, though other LFTs seem to be improving/stable AST 105 (123), ALT 28 (25), Albumin 2.2 (2.4), ALk Phos 203 (224), INR 1.5 (1.4), PT 18.4 (17.4), plt 165k  (129). MELD: 25.   I had a thorough discussion with patient about the imperativeness of COMPLETE alcohol cessation, meaning no amount of alcohol and complete compliance with outpatient medications and routine cirrhosis management with his GI doctor. I discussed the importance of therapy/counseling in order to remain sober, which patient reports he is doing with a psychologist on outpatient basis. Patient verbalized understanding of importance of strict avoidance of etoh and ongoing mental health care for alcohol abuse/GI management for his alcoholic cirrhosis/alcoholic hepatitis.  Melena: chronic, hemoglobin 12.7 today, down from 13.1 yesterday, none reported today and no rectal bleeding reported. Reassuringly he had EGD in Dec 2021 that was without esophageal varices or any active source of bleeding.  LUQ Pain: low suspicion for pancreatitis or other GI etiology, given negative lipase and lack of pancreatic inflammation on MRCP, recent EGD with portal gastropathy, gastritis, duodenitis. Etiology likely musculoskeletal as pain is worsened with movement.   Plan: -Continue prednisolone 4620mdaily x28 days, titrate over 2-4 weeks, decreasing by 20m120mach week -Titrate lactulose for 3 soft BMs daily -Continue xifaxan 550m74mD -Avoid acetaminophen and NSAIDs -Patient to have LFTs/INR rechecked on Monday to ensure no decline in his overall status -Should have CMP, CBC and PT/INR on Thursday to evaluate for effectiveness of steroids after completion of 1 week of therapy. -Must continue with alcohol cessation -Continue to follow with psychologist for counseling/therapy -Will follow up with Dr. BeavTarri Glennoutpatient basis for previously established GI care, patient needs to set this up, he can follow with [facility] for continued monitoring of alcoholic Hepatitis in the next 2-3 weeks if he is unable to see Dr. BeavTarri Glenning that time.    LOS: 3 days    04/07/2021, 9:12 AM  Nashaly Dorantes L. CarlAlver SorrowSN, APRN, AGNP-C Adult-Gerontology Nurse Practitioner [facility]

## 2021-04-08 LAB — CULTURE, BLOOD (ROUTINE X 2)
Culture: NO GROWTH
Culture: NO GROWTH
Special Requests: ADEQUATE
Special Requests: ADEQUATE

## 2021-04-10 ENCOUNTER — Other Ambulatory Visit (HOSPITAL_COMMUNITY)
Admission: RE | Admit: 2021-04-10 | Discharge: 2021-04-10 | Disposition: A | Discharge status: Home / Self Care | Payer: 59 | Attending: Internal Medicine | Admitting: Internal Medicine

## 2021-04-10 ENCOUNTER — Other Ambulatory Visit (INDEPENDENT_AMBULATORY_CARE_PROVIDER_SITE_OTHER): Payer: Self-pay | Admitting: Internal Medicine

## 2021-04-10 DIAGNOSIS — K7469 Other cirrhosis of liver: Secondary | ICD-10-CM | POA: Insufficient documentation

## 2021-04-10 LAB — COMPREHENSIVE METABOLIC PANEL
ALT: 29 U/L (ref 0–44)
AST: 63 U/L — ABNORMAL HIGH (ref 15–41)
Albumin: 2.4 g/dL — ABNORMAL LOW (ref 3.5–5.0)
Alkaline Phosphatase: 218 U/L — ABNORMAL HIGH (ref 38–126)
Anion gap: 10 (ref 5–15)
BUN: 11 mg/dL (ref 6–20)
CO2: 20 mmol/L — ABNORMAL LOW (ref 22–32)
Calcium: 8 mg/dL — ABNORMAL LOW (ref 8.9–10.3)
Chloride: 103 mmol/L (ref 98–111)
Creatinine, Ser: 0.45 mg/dL — ABNORMAL LOW (ref 0.61–1.24)
GFR, Estimated: >60 mL/min (ref >60)
Glucose, Bld: 161 mg/dL — ABNORMAL HIGH (ref 70–99)
Potassium: 3.2 mmol/L — ABNORMAL LOW (ref 3.5–5.1)
Sodium: 133 mmol/L — ABNORMAL LOW (ref 135–145)
Total Bilirubin: 22.3 mg/dL (ref 0.3–1.2)
Total Protein: 6.2 g/dL — ABNORMAL LOW (ref 6.5–8.1)

## 2021-04-10 LAB — CBC WITH DIFFERENTIAL/PLATELET
Abs Immature Granulocytes: 0.1 10*3/uL — ABNORMAL HIGH (ref 0.00–0.07)
Basophils Absolute: 0 10*3/uL (ref 0.0–0.1)
Basophils Relative: 0 %
Eosinophils Absolute: 0 10*3/uL (ref 0.0–0.5)
Eosinophils Relative: 0 %
HCT: 37.2 % — ABNORMAL LOW (ref 39.0–52.0)
Hemoglobin: 13.7 g/dL (ref 13.0–17.0)
Immature Granulocytes: 1 %
Lymphocytes Relative: 4 %
Lymphs Abs: 0.5 10*3/uL — ABNORMAL LOW (ref 0.7–4.0)
MCH: 37.6 pg — ABNORMAL HIGH (ref 26.0–34.0)
MCHC: 36.8 g/dL — ABNORMAL HIGH (ref 30.0–36.0)
MCV: 102.2 fL — ABNORMAL HIGH (ref 80.0–100.0)
Monocytes Absolute: 2.2 10*3/uL — ABNORMAL HIGH (ref 0.1–1.0)
Monocytes Relative: 18 %
Neutro Abs: 9.1 10*3/uL — ABNORMAL HIGH (ref 1.7–7.7)
Neutrophils Relative %: 77 %
Platelets: 320 10*3/uL (ref 150–400)
RBC: 3.64 MIL/uL — ABNORMAL LOW (ref 4.22–5.81)
RDW: 18.6 % — ABNORMAL HIGH (ref 11.5–15.5)
WBC: 12 10*3/uL — ABNORMAL HIGH (ref 4.0–10.5)
nRBC: 0 % (ref 0.0–0.2)

## 2021-04-10 LAB — PROTIME-INR
INR: 1.4 — ABNORMAL HIGH (ref 0.8–1.2)
Prothrombin Time: 16.8 seconds — ABNORMAL HIGH (ref 11.4–15.2)

## 2021-04-10 MED ORDER — POTASSIUM CHLORIDE CRYS ER 20 MEQ PO TBCR
20.0000 meq | EXTENDED_RELEASE_TABLET | Freq: Every day | ORAL | 0 refills | Status: DC
Start: 2021-04-10 — End: 2021-05-16

## 2021-04-10 MED ORDER — OXYCODONE HCL 5 MG PO CAPS: 5.0000 mg | ORAL_CAPSULE | Freq: Three times a day (TID) | ORAL | 0 refills | Status: DC | PRN

## 2021-04-11 ENCOUNTER — Telehealth: Payer: Self-pay | Admitting: Gastroenterology

## 2021-04-11 ENCOUNTER — Telehealth (INDEPENDENT_AMBULATORY_CARE_PROVIDER_SITE_OTHER): Payer: Self-pay | Admitting: *Deleted

## 2021-04-11 ENCOUNTER — Other Ambulatory Visit (INDEPENDENT_AMBULATORY_CARE_PROVIDER_SITE_OTHER): Payer: Self-pay | Admitting: Internal Medicine

## 2021-04-11 MED ORDER — OXYCODONE HCL 5 MG PO CAPS: 5.0000 mg | ORAL_CAPSULE | Freq: Three times a day (TID) | ORAL | 0 refills | Status: DC | PRN

## 2021-04-11 NOTE — Telephone Encounter (Signed)
Patient called.  He was discharged from hospital a couple days ago.  The lab called him yesterday to let him know that his labs are at critical values.  He says he feels he is worse since they discharged him from the hospital and feels he may need to be hospitalized again.  Please call patient as soon as you can to let him know what your recommendations are.  Thank you.

## 2021-04-11 NOTE — Telephone Encounter (Signed)
Pt  called states oxycodone not received at pharm. And also that he passed some blood when having a BM today. States BM was painful. Consult with dr Laural Golden and he resent pain med and states bleeding could be from hemorroid but if seeing any more bleeding then to go to ED. I called and discussed all with patient and he verbalized understanding.

## 2021-04-11 NOTE — Telephone Encounter (Signed)
I do not think he needs to go to ED for the bilirubin number or dark urine  Need to stick with treatment plan  The INR is ok - stable - that is what will tell us if liver is worsening and it is not so good news   Schedule w/ Amy 1/9 please

## 2021-04-11 NOTE — Telephone Encounter (Signed)
Called and spoke with pt. Pt states he is worried about lab results. Reviewed Dr. Olevia Perches note about lab work with pt and asked if pt had picked up medications prescribed. Pt states he has not picked up oxycodone yet but thinks he has picked up potassium. Pt very concerned about bilirubin of 22.3 and wanted to know if he should be admitted. Told pt we do not admit and that he would need to go to ER and they could decide if he needs to be admitted. Pt wanted to know doctors opinion about whether or not he should be admitted to hospital for his lab. Pt states he is concerned that he wont make it to next appointment. He states his pee looks like blood and he is having abdominal pain. Pt has appt with PCP on 04/14/21. Dr. Laural Golden wanted pt to follow up with Dr. Tarri Glenn this week otherwise they would get lab work on 04/13/21. Dr. Tarri Glenn doesn't have available appt until Feb. Amy Trellis Paganini has available appt on 04/17/21. Should I schedule him for appt on  04/17/21, or should he just plan to get labs on 04/13/21 and follow up with PCP on 04/14/21, or should he go to ED?   Dr. Carlean Purl as DOD please advise.

## 2021-04-11 NOTE — Telephone Encounter (Signed)
Called and spoke with pt. Let pt know Dr. Celesta Aver recommendations. Scheduled pt for appt with Amy Esterwood on 04/17/21 at 3pm. Pt verbalized understanding and had no other concerns at end of call.

## 2021-04-12 ENCOUNTER — Other Ambulatory Visit (INDEPENDENT_AMBULATORY_CARE_PROVIDER_SITE_OTHER): Payer: Self-pay | Admitting: *Deleted

## 2021-04-12 DIAGNOSIS — K7469 Other cirrhosis of liver: Secondary | ICD-10-CM

## 2021-04-12 DIAGNOSIS — K7011 Alcoholic hepatitis with ascites: Secondary | ICD-10-CM

## 2021-04-14 ENCOUNTER — Encounter: Payer: Self-pay | Admitting: Family Medicine

## 2021-04-14 ENCOUNTER — Ambulatory Visit: Payer: 59 | Admitting: Family Medicine

## 2021-04-14 ENCOUNTER — Other Ambulatory Visit: Payer: Self-pay

## 2021-04-14 VITALS — BP 104/60 | HR 96 | Ht 68.0 in | Wt 200.0 lb

## 2021-04-14 DIAGNOSIS — K7010 Alcoholic hepatitis without ascites: Secondary | ICD-10-CM

## 2021-04-14 DIAGNOSIS — R17 Unspecified jaundice: Secondary | ICD-10-CM | POA: Diagnosis not present

## 2021-04-14 DIAGNOSIS — K7030 Alcoholic cirrhosis of liver without ascites: Secondary | ICD-10-CM | POA: Diagnosis not present

## 2021-04-14 DIAGNOSIS — R69 Illness, unspecified: Secondary | ICD-10-CM | POA: Diagnosis not present

## 2021-04-14 MED ORDER — PREDNISONE 20 MG PO TABS: 40.0000 mg | ORAL_TABLET | Freq: Every day | ORAL | 0 refills | Status: DC

## 2021-04-14 NOTE — Progress Notes (Signed)
Subjective:    Patient ID: Christopher Carrillo, male    DOB: 07/16/1970, 51 y.o.   MRN: [id number]  HPI Admission date:  04/03/2021  Admitting Physician  Rolla Plate, DO   Discharge Date:  04/07/2021    Primary MD  Susy Frizzle, MD   Recommendations for primary care physician for things to follow:    1)Continue Prednisolone 5m daily x28 days,  decreasing by 165meach week and should be tapered/weaned off  in about 4 weeks   2)Complete Abstinence from Alcohol Advised, patient or inpatient alcohol rehab programs advised   3)Follow up with Dr. ReLaural Golden address [address], [id number],,[place] [phone] 1 week for repeat CBC, CMP and PT/INR blood tests and Re-evaluation   4)Avoid acetaminophen and NSAIDs   5)Avoid ibuprofen/Advil/Aleve/Motrin/Goody Powders/Naproxen/BC powders/Meloxicam/Diclofenac/Indomethacin and other Nonsteroidal anti-inflammatory medications as these will make you more likely to bleed and can cause stomach ulcers, can also cause Kidney problems.     Admission Diagnosis  Lower urinary tract infectious disease [N39.0] Jaundice [R[S17]lcoholic hepatitis, unspecified whether ascites present [K70.10] COVID-19 [U07.1]     Discharge Diagnosis  Lower urinary tract infectious disease [N39.0] Jaundice [R[B93]lcoholic hepatitis, unspecified whether ascites present [K70.10] COVID-19 [U07.1]     Principal Problem:   Jaundice Active Problems:   Tobacco use   Alcoholic hepatitis   [id number]         Past Medical History:  Diagnosis Date   Abdominal pain     Alcoholic hepatitis without ascites     Anemia     Anxiety     Arthritis     Elevated bilirubin     Essential hypertension     Psoriasis     Transaminitis             Past Surgical History:  Procedure Laterality Date   BIOPSY   07/20/2019    Procedure: BIOPSY;  Surgeon: RoDaneil DolinMD;  [facility];  Service: Endoscopy;;   BIOPSY    08/14/2019    Procedure: BIOPSY;  Surgeon: BeThornton ParkMD;  [place] [facility];;   COLONOSCOPY       COLONOSCOPY WITH PROPOFOL N/A 07/20/2019    Procedure: COLONOSCOPY WITH PROPOFOL;  Surgeon: RoDaneil DolinMD;  [facility];  Service: Endoscopy;  Laterality: N/A;  10:15am   COLONOSCOPY WITH PROPOFOL N/A 08/14/2019    Procedure: COLONOSCOPY WITH PROPOFOL;  Surgeon: BeThornton ParkMD;  [place] [facility]: [facility];  Laterality: N/A;   IR PARACENTESIS   08/14/2019   POLYPECTOMY   07/20/2019    Procedure: POLYPECTOMY;  Surgeon: RoDaneil DolinMD;  [facility];  Service: Endoscopy;;   SUBMUCOSAL TATTOO INJECTION   08/14/2019    Procedure: SUBMUCOSAL TATTOO INJECTION;  Surgeon: BeThornton ParkMD;  [facility];  Service: Gastroenterology;;           HPI  from the history and physical done on the day of admission:     ChJeffren Dombekis a [age].o. male, with history of alcoholic hepatitis, anxiety, essential hypertension, psoriasis, and more presents the ED with a chief complaint of stomach pain and jaundice.  Patient reports that started approximately 1 week ago.  1 week prior to that he had had a colonoscopy with conscious sedation.  Patient reports that this would be the third time he has become jaundice after having anesthesia.  Patient reports that been getting worse since it started.  Today his urine became dark.  He describes it as a color between coffee and tea.  He had some left lower quadrant pain.  He reports loose stools but he does take lactulose.  He tries to take his lactulose every day but does not always get to as he believes it interferes with his outings.  The last time he took it was the day prior to presentation.  Patient reports he started noticing his jaundice 4-5 days ago.  Been gradually worse.  Patient does still have his gallbladder.  Patient describes his abdominal pain  as a dull ache.  Laying on that side or palpating it makes it more like a sharp pain.  Patient reports its been 1 month since he had alcohol.  He reports being fevers but no chills.  He did have a fever in the ED.  Patient does have some slight dysuria as well.  He tries to drink 65-100 ounces of water per day but reports that recently it might of been a little decreased.   Patient is a current smoker one third of a pack per day.  He has not had a drink in a month.  He does not use illicit drugs.  Patient is full code.   In the ED Patient is febrile 101.8, heart rate 104-109, respiratory rate 18, blood pressure 132/67, satting 100% No leukocytosis with white blood cell count of 8.8, hemoglobin 14.3 Potassium 3.3, sodium 129, alk phos 297, albumin 2.9, AST 229, T bili 15.2, ammonia 71 Chest x-ray shows no active disease COVID-positive Blood cultures pending Alcohol level less than 10 UA suspicious for UTI Urine culture pending Admission requested for further evaluation and treatment of jaundice        Hospital Course:      Brief Narrative:  51 y.o. male, with history of alcoholic liver cirrhosis with portal hypertension and anxiety, essential hypertension, psoriasis admitted on 04/04/2021 with elevated LFTs and COVID-19 infection -GI service initiated prednisolone on 04/06/2021    A/p Hyperbilirubinemia/alcoholic liver cirrhosis -Patient has biopsy-proven alcoholic cirrhosis -He has cut back on alcoholic intake but did not actually quit per se  -Imaging studies including gallbladder ultrasound and MRCP consistent with alcoholic liver cirrhosis with diffuse steatosis - did show distended gallbladder with wall thickening.  No definite stones were identified.  CBD measured up to 8 mm. -Trace ascites noted -Acute viral hepatitis panel noted to be unremarkable -GI consult and input appreciated Repeat labs for MELD, DF, platelets Patient is able to -Lactulose adjusted by GI service,   -Continue rifaximin --GI service initiated prednisolone on 04/06/2021  -Continue Prednisolone 44m daily x28 days,  decreasing by 129meach week and should be tapered/weaned off  in about 4 weeks   2)COVID-19 infection -No further fevers and respiratory symptoms at this time -No hypoxia, radiologically no COVID-pneumonia -He reports that he did receive 2 Moderna vaccines, but has not had a booster.  Last vaccination was approximately a year ago. -Okay to complete molnupiravir, since he does have underlying comorbidities.   Possible UTI --Initially treated with Rocephin, urine culture NGTD no further antibiotics needed   Hyponatremia--- sodium currently around 132, suspect related to underlying liver disease --- cannot rule out some component of beer potomania   - Elevated lipase-----mild elevated lipase noted clinically and radiologically no evidence of significant pancreatitis at this time   -Hypokalemia suspect due to lactulose induced diarrhea--normalized with replacement   Alcohol abuse---  -  Patient admitted to GI midlevel practitioner Ms.Carlan that he did cut down his drinking but never really stopped drinking per se,  -patient states that his last alcoholic intake was about a week prior to admission according to patient -Complete abstinence from alcohol and alcohol rehab programs advised,   Disposition--- Home   Disposition: The patient is from: Home              Anticipated d/c is to: Home  04/14/21 Patient appears worse than when I last saw him.  Bilirubin was 22.3 on January 2.  Meld score is 22 with an INR of 1.4.  Patient is mentating appropriately today however he is severely jaundiced.  He denies any chest pain or shortness of breath.  There is no pitting edema in his extremities and there is no ascites on exam today.  He does have pain in his left abdomen and left flank and left upper quadrant.  Based on his MRI results from the hospital I suspect the splenomegaly.   There is no ascites seen on his ultrasound.  He is not having any fever or chills.  There is no evidence of peritonitis today on his exam.  He denies any bloody diarrhea and there is no guarding or pain or tenderness to palpation in the left lower quadrant.  Therefore I anticipate that the left upper quadrant pain is likely due to the splenomegaly.  He is taking rifaximin and lactulose.  He denies any symptoms of encephalopathy.  There is no asterixis today on exam.  There is no evidence of fluid overload however he appears to be in liver failure secondary to alcoholic hepatitis compounded by COVID Past Medical History:  Diagnosis Date   Abdominal pain    Alcoholic hepatitis without ascites    Anemia    Anxiety    Arthritis    Elevated bilirubin    Essential hypertension    Psoriasis    Transaminitis    Past Surgical History:  Procedure Laterality Date   BIOPSY  07/20/2019   Procedure: BIOPSY;  Surgeon: Daneil Dolin, MD;  [facility];  Service: Endoscopy;;   BIOPSY  08/14/2019   Procedure: BIOPSY;  Surgeon: Thornton Park, MD;  Location: [place];  Service: Gastroenterology;;   COLONOSCOPY     COLONOSCOPY WITH PROPOFOL N/A 07/20/2019   Procedure: COLONOSCOPY WITH PROPOFOL;  Surgeon: Daneil Dolin, MD;  [facility];  Service: Endoscopy;  Laterality: N/A;  10:15am   COLONOSCOPY WITH PROPOFOL N/A 08/14/2019   Procedure: COLONOSCOPY WITH PROPOFOL;  Surgeon: Thornton Park, MD;  Location: [place];  Service: Gastroenterology;  Laterality: N/A;   IR PARACENTESIS  08/14/2019   POLYPECTOMY  07/20/2019   Procedure: POLYPECTOMY;  Surgeon: Daneil Dolin, MD;  [facility];  Service: Endoscopy;;   SUBMUCOSAL TATTOO INJECTION  08/14/2019   Procedure: SUBMUCOSAL TATTOO INJECTION;  Surgeon: Thornton Park, MD;  Location: [place];  Service: Gastroenterology;;   Current Outpatient Medications on File Prior to Visit  Medication Sig Dispense  Refill   folic acid (FOLVITE) 1 MG tablet Take 1 tablet (1 mg total) by mouth daily. 30 tablet 3   lactulose (CHRONULAC) 10 GM/15ML solution Take 15 mLs (10 g total) by mouth daily. 473 mL 2   Multiple Vitamins-Minerals (MULTIVITAMIN WITH MINERALS) tablet Take 1 tablet by mouth daily. 120 tablet 2   ondansetron (ZOFRAN) 4 MG tablet Take 1 tablet (4 mg total) by mouth every 6 (six) hours as needed for  nausea. 20 tablet 0   oxycodone (OXY-IR) 5 MG capsule Take 1 capsule (5 mg total) by mouth 3 (three) times daily as needed. For severe pain only 21 capsule 0   potassium chloride SA (KLOR-CON M) 20 MEQ tablet Take 1 tablet (20 mEq total) by mouth daily. 30 tablet 0   prednisoLONE 5 MG TABS tablet Take 8 tablets (40 mg total) by mouth daily. Continue Prednisolone 69m daily x28 days,  decreasing by 152meach week and should be tapered/weaned off  in about 4 weeks 90 tablet 0   predniSONE (DELTASONE) 20 MG tablet Take 2 tablets (40 mg total) by mouth daily with breakfast. 14 tablet 0   rifaximin (XIFAXAN) 550 MG TABS tablet Take 1 tablet (550 mg total) by mouth 2 (two) times daily. 90 tablet 2   thiamine 100 MG tablet Take 1 tablet (100 mg total) by mouth daily. 30 tablet 2   No current facility-administered medications on file prior to visit.   No Known Allergies Social History   Socioeconomic History   Marital status: Married    Spouse name: Not on file   Number of children: Not on file   Years of education: Not on file   Highest education level: Not on file  Occupational History   Not on file  Tobacco Use   Smoking status: Every Day    Packs/day: 0.33    Types: Cigarettes   Smokeless tobacco: Never  Vaping Use   Vaping Use: Never used  Substance and Sexual Activity   Alcohol use: Yes    Alcohol/week: 1.0 standard drink    Types: 1 Cans of beer per week    Comment: occasionally   Drug use: No   Sexual activity: Yes  Other Topics Concern   Not on file  Social History Narrative    Not on file   Social Determinants of Health   Financial Resource Strain: Not on file  Food Insecurity: Not on file  Transportation Needs: Not on file  Physical Activity: Not on file  Stress: Not on file  Social Connections: Not on file  Intimate Partner Violence: Not on file     Review of Systems     Objective:   Physical Exam Vitals reviewed.  Constitutional:      General: He is not in acute distress.    Appearance: He is normal weight. He is ill-appearing. He is not toxic-appearing or diaphoretic.  Eyes:     General: Scleral icterus present.  Cardiovascular:     Rate and Rhythm: Normal rate and regular rhythm.     Heart sounds: Normal heart sounds. No murmur heard.   No friction rub. No gallop.  Pulmonary:     Effort: Pulmonary effort is normal. No respiratory distress.     Breath sounds: Normal breath sounds. No stridor. No wheezing, rhonchi or rales.  Abdominal:     General: Abdomen is flat. Bowel sounds are normal. There is no distension.     Palpations: Abdomen is soft.     Tenderness: There is no abdominal tenderness. There is no guarding or rebound.  Skin:    Coloration: Skin is jaundiced. Skin is not pale.  Neurological:     General: No focal deficit present.     Mental Status: He is alert and oriented to person, place, and time. Mental status is at baseline.          Assessment & Plan:  Alcoholic hepatitis without ascites - Plan: CBC with Differential/Platelet, COMPLETE  METABOLIC PANEL WITH GFR, PT with INR/Fingerstick, Protime-INR  Alcoholic cirrhosis, unspecified whether ascites present ([place])  Jaundice Patient does not appear to be improving.  Therefore I will continue the prednisone 40 mg a day until he sees his gastroenterologist on Monday.  Continue lactulose and rifaximin as this seems to be preventing hepatic encephalopathy.  Continue to encourage complete total abstinence from alcohol or any hepatotoxic medications.  Recommended avoiding  centrally acting substances such as pain medication to avoid sedation and confusion.  No evidence of diverticulitis or peritonitis on exam today.  No evidence of fluid overload or swelling require diuretics.  Check CBC CMP INR to recalculate meld score.  Explained to the patient that at this point the question is whether his liver will recover from the episode of hepatitis.  If his liver function continues to worsen, he may need referral to transplant center.  Hopefully, the liver dysfunction will gradually start to resolve with time.  Await the results of his lab work and GI consultation

## 2021-04-15 LAB — PROTIME-INR
INR: 1.6 — ABNORMAL HIGH
Prothrombin Time: 15.8 s — ABNORMAL HIGH (ref 9.0–11.5)

## 2021-04-16 LAB — COMPLETE METABOLIC PANEL WITH GFR
AG Ratio: 1.3 (calc) (ref 1.0–2.5)
ALT: 31 U/L (ref 9–46)
AST: 74 U/L — ABNORMAL HIGH (ref 10–40)
Albumin: 3 g/dL — ABNORMAL LOW (ref 3.6–5.1)
Alkaline phosphatase (APISO): 250 U/L — ABNORMAL HIGH (ref 36–130)
BUN: 10 mg/dL (ref 7–25)
CO2: 21 mmol/L (ref 20–32)
Calcium: 8 mg/dL — ABNORMAL LOW (ref 8.6–10.3)
Chloride: 105 mmol/L (ref 98–110)
Creat: 0.74 mg/dL (ref 0.60–1.29)
Globulin: 2.3 g/dL (calc) (ref 1.9–3.7)
Glucose, Bld: 121 mg/dL — ABNORMAL HIGH (ref 65–99)
Potassium: 3.8 mmol/L (ref 3.5–5.3)
Sodium: 137 mmol/L (ref 135–146)
Total Bilirubin: 35.8 mg/dL — ABNORMAL HIGH (ref 0.2–1.2)
Total Protein: 5.3 g/dL — ABNORMAL LOW (ref 6.1–8.1)
eGFR: 111 mL/min/{1.73_m2} (ref >=60)

## 2021-04-16 LAB — CBC WITH DIFFERENTIAL/PLATELET
Absolute Monocytes: 1890 cells/uL — ABNORMAL HIGH (ref 200–950)
Basophils Absolute: 54 cells/uL (ref 0–200)
Basophils Relative: 0.3 %
Eosinophils Absolute: 36 cells/uL (ref 15–500)
Eosinophils Relative: 0.2 %
HCT: 38.3 % — ABNORMAL LOW (ref 38.5–50.0)
Hemoglobin: 14.1 g/dL (ref 13.2–17.1)
Lymphs Abs: 486 cells/uL — ABNORMAL LOW (ref 850–3900)
MCH: 36.3 pg — ABNORMAL HIGH (ref 27.0–33.0)
MCHC: 36.8 g/dL — ABNORMAL HIGH (ref 32.0–36.0)
MCV: 98.7 fL (ref 80.0–100.0)
MPV: 12.5 fL (ref 7.5–12.5)
Monocytes Relative: 10.5 %
Neutro Abs: 15534 cells/uL — ABNORMAL HIGH (ref 1500–7800)
Neutrophils Relative %: 86.3 %
Platelets: 279 10*3/uL (ref 140–400)
RBC: 3.88 10*6/uL — ABNORMAL LOW (ref 4.20–5.80)
RDW: 14.6 % (ref 11.0–15.0)
Total Lymphocyte: 2.7 %
WBC: 18 10*3/uL — ABNORMAL HIGH (ref 3.8–10.8)

## 2021-04-17 ENCOUNTER — Other Ambulatory Visit (INDEPENDENT_AMBULATORY_CARE_PROVIDER_SITE_OTHER): Payer: 59

## 2021-04-17 ENCOUNTER — Ambulatory Visit (INDEPENDENT_AMBULATORY_CARE_PROVIDER_SITE_OTHER): Payer: 59 | Admitting: Physician Assistant

## 2021-04-17 ENCOUNTER — Encounter: Payer: Self-pay | Admitting: Physician Assistant

## 2021-04-17 VITALS — BP 118/68 | HR 95 | Ht 68.0 in | Wt 201.4 lb

## 2021-04-17 DIAGNOSIS — K7010 Alcoholic hepatitis without ascites: Secondary | ICD-10-CM

## 2021-04-17 LAB — COMPREHENSIVE METABOLIC PANEL
ALT: 36 U/L (ref 0–53)
AST: 79 U/L — ABNORMAL HIGH (ref 0–37)
Albumin: 2.9 g/dL — ABNORMAL LOW (ref 3.5–5.2)
Alkaline Phosphatase: 256 U/L — ABNORMAL HIGH (ref 39–117)
BUN: 13 mg/dL (ref 6–23)
CO2: 22 mEq/L (ref 19–32)
Calcium: 8.7 mg/dL (ref 8.4–10.5)
Chloride: 103 mEq/L (ref 96–112)
Creatinine, Ser: 0.83 mg/dL (ref 0.40–1.50)
GFR: 103 mL/min (ref >60.00)
Glucose, Bld: 122 mg/dL — ABNORMAL HIGH (ref 70–99)
Potassium: 3.7 mEq/L (ref 3.5–5.1)
Sodium: 133 mEq/L — ABNORMAL LOW (ref 135–145)
Total Bilirubin: 32.4 mg/dL — ABNORMAL HIGH (ref 0.2–1.2)
Total Protein: 5.5 g/dL — ABNORMAL LOW (ref 6.0–8.3)

## 2021-04-17 LAB — AMMONIA: Ammonia: 83 umol/L — ABNORMAL HIGH (ref 11–35)

## 2021-04-17 LAB — PROTIME-INR
INR: 1.8 ratio — ABNORMAL HIGH (ref 0.8–1.0)
Prothrombin Time: 19.4 s — ABNORMAL HIGH (ref 9.6–13.1)

## 2021-04-17 LAB — CBC WITH DIFFERENTIAL/PLATELET
Basophils Absolute: 0 10*3/uL (ref 0.0–0.1)
Basophils Relative: 0.1 % (ref 0.0–3.0)
Eosinophils Absolute: 0 10*3/uL (ref 0.0–0.7)
Eosinophils Relative: 0.2 % (ref 0.0–5.0)
HCT: 39.5 % (ref 39.0–52.0)
Hemoglobin: 13.3 g/dL (ref 13.0–17.0)
Lymphocytes Relative: 26.9 % (ref 12.0–46.0)
Lymphs Abs: 5.1 10*3/uL — ABNORMAL HIGH (ref 0.7–4.0)
MCHC: 33.8 g/dL (ref 30.0–36.0)
MCV: 106.1 fl — ABNORMAL HIGH (ref 78.0–100.0)
Monocytes Absolute: 1.8 10*3/uL — ABNORMAL HIGH (ref 0.1–1.0)
Monocytes Relative: 9.7 % (ref 3.0–12.0)
Neutro Abs: 12.1 10*3/uL — ABNORMAL HIGH (ref 1.4–7.7)
Neutrophils Relative %: 63.1 % (ref 43.0–77.0)
Platelets: 310 10*3/uL (ref 150.0–400.0)
RBC: 3.72 Mil/uL — ABNORMAL LOW (ref 4.22–5.81)
RDW: 15.6 % — ABNORMAL HIGH (ref 11.5–15.5)
WBC: 18.5 10*3/uL (ref 4.0–10.5)

## 2021-04-17 MED ORDER — THIAMINE HCL 100 MG PO TABS: 100.0000 mg | ORAL_TABLET | Freq: Every day | ORAL | 2 refills | Status: DC

## 2021-04-17 NOTE — Patient Instructions (Addendum)
If you are age 49 or younger, your body mass index should be between 19-25. Your Body mass index is 30.62 kg/m. If this is out of the aformentioned range listed, please consider follow up with your Primary Care Provider.  ________________________________________________________  The [place] [facility] providers would like to encourage you to use [facility] to communicate with providers for non-urgent requests or questions.  Due to long hold times on the telephone, sending your provider a message by [facility] may be a faster and more efficient way to get a response.  Please allow 48 business hours for a response.  Please remember that this is for non-urgent requests.  _______________________________________________________  Your provider has requested that you go to the basement level for lab work before leaving today. Press "B" on the elevator. The lab is located at the first door on the left as you exit the elevator.  START Thiamine 100 mg 1 tablet daily. We have resent this to your pharmacy.  STOP Prednisone  Continue using Lactulose and Xifaxan   Follow up pending the results of your labs.  Thank you for entrusting me with your care and choosing [facility].  Amy Esterwood, PA-C

## 2021-04-17 NOTE — Progress Notes (Signed)
Subjective:    Patient ID: Christopher Carrillo, male    DOB: 03-Feb-1971, 51 y.o.   MRN: [id number]  HPI Christopher Carrillo is a 51 year old white male, established with Dr. Tarri Glenn who was last seen in our office in October 2022 with concerns about increasing alk phos and GGT.  This is in the setting of history of ongoing alcohol use and prior severe alcoholic hepatitis for which she was treated with steroids in April and May 2021.  Patient had associated encephalopathy while hospitalized.  He was also treated for acute diverticulitis in June 2022. He comes back in today for follow-up after recent hospitalization at [facility] 12/26 through 04/07/2021 at which time he was treated for severe alcoholic hepatitis, presenting with severe hyperbilirubinemia in the 16 range.  He met criteria for steroids and was started on prednisolone 40 mg p.o. daily. Initial discriminant function was 37-38 range. He did undergo MRI/MRCP during that hospitalization which showed diffuse hepatic steatosis with cirrhosis, no suspicious hepatic lesion, gallbladder was distended without gallstones and common bile duct of 4 mm. Patient had still been drinking alcohol up until about 1 month prior to that hospitalization by the notes. He did undergo EGD in December 2022 for Dr. Tarri Glenn prior to that hospitalization for variceal surveillance, esophagus was normal noted to have moderate portal gastropathy and mild duodenitis.  Duodenal biopsies were negative, gastric biopsy showed mild nonspecific inflammation and focal intestinal metaplasia no H. pylori. He had follow-up with his PCP since discharge from the hospital and unfortunately his bilirubin has been on the rise. They were unable to obtain prednisolone on discharge and he has been on prednisone 40 mg p.o. daily over the past 10 days. Most recent labs 04/14/2021 with WBC 18,000, hemoglobin 14.1, platelets 279 INR 1.6/pro time 15.8 up from previous T bili 35.8/alk phos 250/AST 74/ALT  31 AFP 2.2  Patient admits that he has had alcohol since discharge from the hospital, is vague but says just a little bit about a week ago, he does not admit to ongoing daily use. He has not been feeling well, in general has been very weak and fatigued.  His wife says that he sleeps a lot and falls asleep easily during the day, he has had some mild confusion/mostly forgetfulness.  He has not had any falls. He has been on lactulose 15 cc daily but says he has frequent bowel movements and if he has been to the bathroom 3 or 4 times in a day he will not take lactulose, also discharged on Xifaxan 550 twice daily.  Patient is also previously been evaluated by [facility]/Christopher DrazekNP fall 2022, and it was made clear to the patient at that time that he was not a transplant candidate with ongoing EtOH use and tobacco use.  Review of Systems. Pertinent positive and negative review of systems were noted in the above HPI section.  All other review of systems was otherwise negative.   Outpatient Encounter Medications as of 04/17/2021  Medication Sig   folic acid (FOLVITE) 1 MG tablet Take 1 tablet (1 mg total) by mouth daily.   lactulose (CHRONULAC) 10 GM/15ML solution Take 15 mLs (10 g total) by mouth daily.   Multiple Vitamins-Minerals (MULTIVITAMIN WITH MINERALS) tablet Take 1 tablet by mouth daily.   ondansetron (ZOFRAN) 4 MG tablet Take 1 tablet (4 mg total) by mouth every 6 (six) hours as needed for nausea.   oxycodone (OXY-IR) 5 MG capsule Take 1 capsule (5 mg total) by mouth 3 (  three) times daily as needed. For severe pain only   potassium chloride SA (KLOR-CON M) 20 MEQ tablet Take 1 tablet (20 mEq total) by mouth daily.   predniSONE (DELTASONE) 20 MG tablet Take 2 tablets (40 mg total) by mouth daily with breakfast.   rifaximin (XIFAXAN) 550 MG TABS tablet Take 1 tablet (550 mg total) by mouth 2 (two) times daily.   thiamine 100 MG tablet Take 1 tablet (100 mg total) by mouth daily.    [DISCONTINUED] prednisoLONE 5 MG TABS tablet Take 8 tablets (40 mg total) by mouth daily. Continue Prednisolone 19m daily x28 days,  decreasing by 159meach week and should be tapered/weaned off  in about 4 weeks   [DISCONTINUED] thiamine 100 MG tablet Take 1 tablet (100 mg total) by mouth daily. (Patient not taking: Reported on 04/17/2021)   No facility-administered encounter medications on file as of 04/17/2021.   No Known Allergies Patient Active Problem List   Diagnosis Date Noted   COVID-19    Jaundice 04/04/2021   Overweight (BMI 25.0-29.9) 09/28/2020   Nausea & vomiting 09/23/2020   GI bleed 09/23/2020   Hyponatremia 09/23/2020   Hypokalemia 09/23/2020   Dehydration 09/23/2020   AKI (acute kidney injury) ([facility]06/17/2022   Acute diarrhea 0695/63/8756 Alcoholic cirrhosis ([place]0543/32/9518 Encephalopathy, portal systemic 08/09/2020   Polyarthralgia 01/08/2020   History of diverticulosis 10/28/2019   Internal and external bleeding hemorrhoids 08/20/2019   Colon ulcer    Hematochezia 08/12/2019   Leukocytosis 08/12/2019   Coagulopathy ([facility]   Lower extremity edema 07/29/2019   Urinary hesitancy 07/29/2019   Anemia of chronic disease 0484/16/6063 Alcoholic hepatitis 0401/60/1093 Elevated bilirubin    Elevated LFTs 07/14/2019   Hepatic steatosis 02/18/2019   GAD (generalized anxiety disorder) 02/18/2019   GERD (gastroesophageal reflux disease) 04/08/2013   Abdominal pain 04/07/2013   Hyperlipidemia 12/19/2012   Essential hypertension 12/19/2012   Routine general medical examination at a health care facility 08/07/2012   Psoriatic arthritis ([facility]01/26/2014   Obese 05/04/2012   Tobacco use 05/04/2012   Social History   Socioeconomic History   Marital status: Married    Spouse name: Not on file   Number of children: Not on file   Years of education: Not on file   Highest education level: Not on file  Occupational History   Not on file  Tobacco Use   Smoking  status: Every Day    Packs/day: 0.33    Types: Cigarettes   Smokeless tobacco: Never  Vaping Use   Vaping Use: Never used  Substance and Sexual Activity   Alcohol use: Yes    Alcohol/week: 1.0 standard drink    Types: 1 Cans of beer per week    Comment: occasionally   Drug use: No   Sexual activity: Yes  Other Topics Concern   Not on file  Social History Narrative   Not on file   Social Determinants of Health   Financial Resource Strain: Not on file  Food Insecurity: Not on file  Transportation Needs: Not on file  Physical Activity: Not on file  Stress: Not on file  Social Connections: Not on file  Intimate Partner Violence: Not on file    Mr. WiPrittamily history includes Asthma in his father; Cancer (age of onset: 1879in his sister.      Objective:    Vitals:   04/17/21 1500  BP: 118/68  Pulse: 95  SpO2: 97%  Physical Exam. Well-developed ill-appearing deeply jaundiced white male in no acute distress.  Accompanied by his wife height, Weight, 201 BMI 30.6  HEENT; nontraumatic normocephalic, EOMI, PE R LA, sclera deeply icteric Oropharynx; not examined Neck; supple, no JVD Cardiovascular; regular rate and rhythm with S1-S2, no murmur rub or gallop Pulmonary; Clear bilaterally Abdomen; soft, protuberant, he is tender across the upper abdomen with markedly enlarged left hepatic lobe, no definite palpable splenomegaly  bowel sounds are active, nontense ascites Rectal; not done today Skin; benign exam, deeply jaundiced Extremities; no clubbing cyanosis or edema skin warm and dry Neuro/Psych; alert and oriented x4, grossly nonfocal mood and affect appropriate positive early asterixis       Assessment & Plan:   #35 51 year old white male with recurrent severe alcoholic hepatitis, and underlying cirrhosis requiring admission at [facility] 12/26 through 04/07/2021 and initiated on prednisolone with discriminant function in the 37-38 range. Patient has had  ongoing EtOH use, and admits to EtOH use even since discharge from the hospital  Most recent labs show progressive worsening of parameters including rising INR nail at 1.6 and T bili 35.8. Discriminant function score today calculates at 39. He has had progressive worsening despite prednisolone and now prednisone 40 mg daily over the past 10 to 12 days.  #2 hepatic encephalopathy-grade 1-2 with altered sleep pattern, daytime sleepiness and forgetfulness #3 portal gastropathy documented 03/2021, no varices at EGD  Plan; discontinue prednisone as no response to treatment over the past 10 to 12 days, and not recommended to continue in this setting Repeat labs today to include CBC, c-Met, INR, venous ammonia Patient and wife were apprised of his deteriorating status, and very high mortality rate over the next 30 days.(Approximately 50%) We do not have any other medication to offer in this situation. Continue Xifaxan 550 mg p.o. twice daily Advised continuing lactulose at least 30 cc daily. Discontinue all alcohol forever I began discussion regarding end-of-life issues, and palliative care.  Patient asked whether he should prepare a living well and was advised that this is definitely indicated at this time. Once repeat labs are reviewed, will message his PCP Dr. Dennard Schaumann.  Will be appropriate to get him established with palliative care as an outpatient if that is available in [employer]. Patient's wife advised that he can be brought to the emergency room for decline in status, overt worsening of hepatic encephalopathy etc.  However there is no indication to hospitalize him at present as we have very limited therapeutic options.  Patton Rabinovich Genia Harold PA-C 04/17/2021   Cc: Susy Frizzle, MD

## 2021-04-18 ENCOUNTER — Other Ambulatory Visit: Payer: Self-pay | Admitting: Family Medicine

## 2021-04-18 ENCOUNTER — Other Ambulatory Visit: Payer: Self-pay

## 2021-04-18 ENCOUNTER — Telehealth: Payer: Self-pay

## 2021-04-18 DIAGNOSIS — K7010 Alcoholic hepatitis without ascites: Secondary | ICD-10-CM

## 2021-04-18 DIAGNOSIS — R17 Unspecified jaundice: Secondary | ICD-10-CM

## 2021-04-18 DIAGNOSIS — K7030 Alcoholic cirrhosis of liver without ascites: Secondary | ICD-10-CM

## 2021-04-18 NOTE — Telephone Encounter (Signed)
Called and spoke to patient and his wife, gave them lab results and that Nicoletta Ba PA wants them to get labs drawn on Firday 04/21/21. They want them drawn at his PCP office. Orders in [place]

## 2021-04-20 NOTE — Progress Notes (Signed)
Reviewed and agree with management plans. Given the limited treatment options in the setting of ongoing alcohol use, outpatient palliative care recommended if this is available in his community.   Joshoa Shawler L. Tarri Glenn, MD, MPH

## 2021-04-21 ENCOUNTER — Other Ambulatory Visit: Payer: Self-pay | Admitting: Family Medicine

## 2021-04-21 ENCOUNTER — Telehealth: Payer: Self-pay

## 2021-04-21 ENCOUNTER — Other Ambulatory Visit: Payer: Self-pay

## 2021-04-21 ENCOUNTER — Other Ambulatory Visit: Payer: 59

## 2021-04-21 DIAGNOSIS — K7010 Alcoholic hepatitis without ascites: Secondary | ICD-10-CM

## 2021-04-21 DIAGNOSIS — K7030 Alcoholic cirrhosis of liver without ascites: Secondary | ICD-10-CM | POA: Diagnosis not present

## 2021-04-21 DIAGNOSIS — R17 Unspecified jaundice: Secondary | ICD-10-CM

## 2021-04-21 DIAGNOSIS — R69 Illness, unspecified: Secondary | ICD-10-CM | POA: Diagnosis not present

## 2021-04-21 DIAGNOSIS — Z7901 Long term (current) use of anticoagulants: Secondary | ICD-10-CM

## 2021-04-21 MED ORDER — TRAMADOL HCL 50 MG PO TABS: 50.0000 mg | ORAL_TABLET | Freq: Four times a day (QID) | ORAL | 0 refills | Status: DC | PRN

## 2021-04-21 NOTE — Telephone Encounter (Signed)
Spoke with pt and advised. Nothing further needed at this time.

## 2021-04-21 NOTE — Telephone Encounter (Signed)
Pt called to report every time he take Oxycodone he starts itching within 30 minutes. Pt would like to know if you could send in an alternative medication. Oxycodone added to pt's allergy list.   Please advise, thanks!

## 2021-04-22 LAB — CBC WITH DIFFERENTIAL/PLATELET
Absolute Monocytes: 2306 cells/uL — ABNORMAL HIGH (ref 200–950)
Basophils Absolute: 0 cells/uL (ref 0–200)
Basophils Relative: 0 %
Eosinophils Absolute: 208 cells/uL (ref 15–500)
Eosinophils Relative: 1.1 %
HCT: 33 % — ABNORMAL LOW (ref 38.5–50.0)
Hemoglobin: 12 g/dL — ABNORMAL LOW (ref 13.2–17.1)
Lymphs Abs: 699 cells/uL — ABNORMAL LOW (ref 850–3900)
MCH: 34.3 pg — ABNORMAL HIGH (ref 27.0–33.0)
MCHC: 36.4 g/dL — ABNORMAL HIGH (ref 32.0–36.0)
MCV: 94.3 fL (ref 80.0–100.0)
MPV: 11.2 fL (ref 7.5–12.5)
Monocytes Relative: 12.2 %
Neutro Abs: 15687 cells/uL — ABNORMAL HIGH (ref 1500–7800)
Neutrophils Relative %: 83 %
Platelets: 210 10*3/uL (ref 140–400)
RBC: 3.5 10*6/uL — ABNORMAL LOW (ref 4.20–5.80)
RDW: 13.3 % (ref 11.0–15.0)
Total Lymphocyte: 3.7 %
WBC: 18.9 10*3/uL — ABNORMAL HIGH (ref 3.8–10.8)

## 2021-04-22 LAB — COMPLETE METABOLIC PANEL WITH GFR
AG Ratio: 1.2 (calc) (ref 1.0–2.5)
ALT: 43 U/L (ref 9–46)
AST: 126 U/L — ABNORMAL HIGH (ref 10–40)
Albumin: 2.6 g/dL — ABNORMAL LOW (ref 3.6–5.1)
Alkaline phosphatase (APISO): 266 U/L — ABNORMAL HIGH (ref 36–130)
BUN: 12 mg/dL (ref 7–25)
CO2: 21 mmol/L (ref 20–32)
Calcium: 8.3 mg/dL — ABNORMAL LOW (ref 8.6–10.3)
Chloride: 101 mmol/L (ref 98–110)
Creat: 1.04 mg/dL (ref 0.60–1.29)
Globulin: 2.2 g/dL (calc) (ref 1.9–3.7)
Glucose, Bld: 70 mg/dL (ref 65–99)
Potassium: 3.5 mmol/L (ref 3.5–5.3)
Sodium: 134 mmol/L — ABNORMAL LOW (ref 135–146)
Total Bilirubin: 33.9 mg/dL — ABNORMAL HIGH (ref 0.2–1.2)
Total Protein: 4.8 g/dL — ABNORMAL LOW (ref 6.1–8.1)
eGFR: 88 mL/min/{1.73_m2} (ref >=60)

## 2021-04-22 LAB — PROTIME-INR
INR: 1.6 — ABNORMAL HIGH
Prothrombin Time: 15.9 s — ABNORMAL HIGH (ref 9.0–11.5)

## 2021-04-22 LAB — CBC MORPHOLOGY

## 2021-04-25 ENCOUNTER — Telehealth: Payer: Self-pay

## 2021-04-25 DIAGNOSIS — K7682 Hepatic encephalopathy: Secondary | ICD-10-CM

## 2021-04-25 DIAGNOSIS — K7010 Alcoholic hepatitis without ascites: Secondary | ICD-10-CM

## 2021-04-25 NOTE — Progress Notes (Signed)
Agreed -

## 2021-04-25 NOTE — Telephone Encounter (Signed)
Spoke with pt and advised per GI he should have labs drawn weekly.  Pt would like to come to this office for labs. Please advise which labs pt would need weekly and if this is ok with you. Thanks!

## 2021-04-25 NOTE — Telephone Encounter (Signed)
Pt called to ask how often he should be coming in for repeat labs.  Please advise, thanks!

## 2021-04-26 NOTE — Telephone Encounter (Signed)
Pt scheduled for labs this Friday. Orders placed.

## 2021-04-28 ENCOUNTER — Other Ambulatory Visit: Payer: Self-pay

## 2021-04-28 ENCOUNTER — Other Ambulatory Visit: Payer: 59

## 2021-04-28 DIAGNOSIS — R69 Illness, unspecified: Secondary | ICD-10-CM | POA: Diagnosis not present

## 2021-04-28 DIAGNOSIS — K7010 Alcoholic hepatitis without ascites: Secondary | ICD-10-CM

## 2021-04-29 LAB — COMPREHENSIVE METABOLIC PANEL
AG Ratio: 1 (calc) (ref 1.0–2.5)
ALT: 47 U/L — ABNORMAL HIGH (ref 9–46)
AST: 114 U/L — ABNORMAL HIGH (ref 10–40)
Albumin: 2.7 g/dL — ABNORMAL LOW (ref 3.6–5.1)
Alkaline phosphatase (APISO): 332 U/L — ABNORMAL HIGH (ref 36–130)
BUN/Creatinine Ratio: 13 (calc) (ref 6–22)
BUN: 47 mg/dL — ABNORMAL HIGH (ref 7–25)
CO2: 16 mmol/L — ABNORMAL LOW (ref 20–32)
Calcium: 8.6 mg/dL (ref 8.6–10.3)
Chloride: 100 mmol/L (ref 98–110)
Creat: 3.76 mg/dL — ABNORMAL HIGH (ref 0.60–1.29)
Globulin: 2.6 g/dL (calc) (ref 1.9–3.7)
Glucose, Bld: 115 mg/dL — ABNORMAL HIGH (ref 65–99)
Potassium: 3.5 mmol/L (ref 3.5–5.3)
Sodium: 129 mmol/L — ABNORMAL LOW (ref 135–146)
Total Bilirubin: 37.1 mg/dL — ABNORMAL HIGH (ref 0.2–1.2)
Total Protein: 5.3 g/dL — ABNORMAL LOW (ref 6.1–8.1)

## 2021-04-29 LAB — CBC WITH DIFFERENTIAL/PLATELET
Absolute Monocytes: 1436 cells/uL — ABNORMAL HIGH (ref 200–950)
Basophils Absolute: 84 cells/uL (ref 0–200)
Basophils Relative: 0.5 %
Eosinophils Absolute: 150 cells/uL (ref 15–500)
Eosinophils Relative: 0.9 %
HCT: 35.6 % — ABNORMAL LOW (ref 38.5–50.0)
Hemoglobin: 14 g/dL (ref 13.2–17.1)
Lymphs Abs: 919 cells/uL (ref 850–3900)
MCH: 35.1 pg — ABNORMAL HIGH (ref 27.0–33.0)
MCHC: 39.3 g/dL — ABNORMAL HIGH (ref 32.0–36.0)
MCV: 89.2 fL (ref 80.0–100.0)
MPV: 9.9 fL (ref 7.5–12.5)
Monocytes Relative: 8.6 %
Neutro Abs: 14112 cells/uL — ABNORMAL HIGH (ref 1500–7800)
Neutrophils Relative %: 84.5 %
Platelets: 223 10*3/uL (ref 140–400)
RBC: 3.99 10*6/uL — ABNORMAL LOW (ref 4.20–5.80)
RDW: 13.1 % (ref 11.0–15.0)
Total Lymphocyte: 5.5 %
WBC: 16.7 10*3/uL — ABNORMAL HIGH (ref 3.8–10.8)

## 2021-04-29 LAB — PROTIME-INR
INR: 1.4 — ABNORMAL HIGH
Prothrombin Time: 14 s — ABNORMAL HIGH (ref 9.0–11.5)

## 2021-05-01 ENCOUNTER — Other Ambulatory Visit: Payer: Self-pay

## 2021-05-01 ENCOUNTER — Encounter (HOSPITAL_COMMUNITY): Payer: Self-pay | Admitting: *Deleted

## 2021-05-01 ENCOUNTER — Inpatient Hospital Stay (HOSPITAL_COMMUNITY)
Admission: EM | Admit: 2021-05-01 | Discharge: 2021-05-05 | DRG: 432 | Disposition: A | Discharge status: Hospice | Payer: Managed Care, Other (non HMO) | Attending: Internal Medicine | Admitting: Internal Medicine

## 2021-05-01 DIAGNOSIS — K5792 Diverticulitis of intestine, part unspecified, without perforation or abscess without bleeding: Secondary | ICD-10-CM | POA: Diagnosis not present

## 2021-05-01 DIAGNOSIS — I10 Essential (primary) hypertension: Secondary | ICD-10-CM | POA: Diagnosis present

## 2021-05-01 DIAGNOSIS — K7682 Hepatic encephalopathy: Secondary | ICD-10-CM | POA: Diagnosis present

## 2021-05-01 DIAGNOSIS — Z66 Do not resuscitate: Secondary | ICD-10-CM | POA: Diagnosis not present

## 2021-05-01 DIAGNOSIS — F419 Anxiety disorder, unspecified: Secondary | ICD-10-CM | POA: Diagnosis present

## 2021-05-01 DIAGNOSIS — K319 Disease of stomach and duodenum, unspecified: Secondary | ICD-10-CM | POA: Diagnosis not present

## 2021-05-01 DIAGNOSIS — N179 Acute kidney failure, unspecified: Secondary | ICD-10-CM | POA: Diagnosis not present

## 2021-05-01 DIAGNOSIS — K3189 Other diseases of stomach and duodenum: Secondary | ICD-10-CM | POA: Diagnosis present

## 2021-05-01 DIAGNOSIS — K921 Melena: Secondary | ICD-10-CM | POA: Diagnosis present

## 2021-05-01 DIAGNOSIS — Z79899 Other long term (current) drug therapy: Secondary | ICD-10-CM

## 2021-05-01 DIAGNOSIS — K7011 Alcoholic hepatitis with ascites: Secondary | ICD-10-CM | POA: Diagnosis not present

## 2021-05-01 DIAGNOSIS — R8281 Pyuria: Secondary | ICD-10-CM | POA: Diagnosis present

## 2021-05-01 DIAGNOSIS — I8510 Secondary esophageal varices without bleeding: Secondary | ICD-10-CM | POA: Diagnosis present

## 2021-05-01 DIAGNOSIS — F1010 Alcohol abuse, uncomplicated: Secondary | ICD-10-CM | POA: Diagnosis present

## 2021-05-01 DIAGNOSIS — E669 Obesity, unspecified: Secondary | ICD-10-CM | POA: Diagnosis present

## 2021-05-01 DIAGNOSIS — E876 Hypokalemia: Secondary | ICD-10-CM | POA: Diagnosis present

## 2021-05-01 DIAGNOSIS — K7030 Alcoholic cirrhosis of liver without ascites: Secondary | ICD-10-CM | POA: Diagnosis present

## 2021-05-01 DIAGNOSIS — R188 Other ascites: Secondary | ICD-10-CM

## 2021-05-01 DIAGNOSIS — K766 Portal hypertension: Secondary | ICD-10-CM | POA: Diagnosis present

## 2021-05-01 DIAGNOSIS — Z515 Encounter for palliative care: Secondary | ICD-10-CM

## 2021-05-01 DIAGNOSIS — Z825 Family history of asthma and other chronic lower respiratory diseases: Secondary | ICD-10-CM

## 2021-05-01 DIAGNOSIS — R69 Illness, unspecified: Secondary | ICD-10-CM | POA: Diagnosis not present

## 2021-05-01 DIAGNOSIS — E872 Acidosis, unspecified: Secondary | ICD-10-CM | POA: Diagnosis not present

## 2021-05-01 DIAGNOSIS — F17210 Nicotine dependence, cigarettes, uncomplicated: Secondary | ICD-10-CM | POA: Diagnosis present

## 2021-05-01 DIAGNOSIS — I700 Atherosclerosis of aorta: Secondary | ICD-10-CM | POA: Diagnosis not present

## 2021-05-01 DIAGNOSIS — Z711 Person with feared health complaint in whom no diagnosis is made: Secondary | ICD-10-CM | POA: Diagnosis not present

## 2021-05-01 DIAGNOSIS — E871 Hypo-osmolality and hyponatremia: Secondary | ICD-10-CM | POA: Diagnosis present

## 2021-05-01 DIAGNOSIS — Z7952 Long term (current) use of systemic steroids: Secondary | ICD-10-CM

## 2021-05-01 DIAGNOSIS — E440 Moderate protein-calorie malnutrition: Secondary | ICD-10-CM | POA: Insufficient documentation

## 2021-05-01 DIAGNOSIS — R638 Other symptoms and signs concerning food and fluid intake: Secondary | ICD-10-CM | POA: Diagnosis not present

## 2021-05-01 DIAGNOSIS — K219 Gastro-esophageal reflux disease without esophagitis: Secondary | ICD-10-CM | POA: Diagnosis not present

## 2021-05-01 DIAGNOSIS — D689 Coagulation defect, unspecified: Secondary | ICD-10-CM | POA: Diagnosis not present

## 2021-05-01 DIAGNOSIS — I8501 Esophageal varices with bleeding: Secondary | ICD-10-CM | POA: Diagnosis not present

## 2021-05-01 DIAGNOSIS — K7010 Alcoholic hepatitis without ascites: Secondary | ICD-10-CM | POA: Diagnosis present

## 2021-05-01 DIAGNOSIS — K7210 Chronic hepatic failure without coma: Secondary | ICD-10-CM | POA: Diagnosis present

## 2021-05-01 DIAGNOSIS — K7031 Alcoholic cirrhosis of liver with ascites: Secondary | ICD-10-CM | POA: Diagnosis present

## 2021-05-01 DIAGNOSIS — Z7189 Other specified counseling: Secondary | ICD-10-CM | POA: Diagnosis not present

## 2021-05-01 DIAGNOSIS — N178 Other acute kidney failure: Secondary | ICD-10-CM

## 2021-05-01 DIAGNOSIS — K767 Hepatorenal syndrome: Secondary | ICD-10-CM | POA: Diagnosis present

## 2021-05-01 DIAGNOSIS — E785 Hyperlipidemia, unspecified: Secondary | ICD-10-CM | POA: Diagnosis not present

## 2021-05-01 DIAGNOSIS — D649 Anemia, unspecified: Secondary | ICD-10-CM | POA: Diagnosis present

## 2021-05-01 DIAGNOSIS — Z8616 Personal history of COVID-19: Secondary | ICD-10-CM | POA: Diagnosis not present

## 2021-05-01 DIAGNOSIS — E8770 Fluid overload, unspecified: Secondary | ICD-10-CM | POA: Diagnosis present

## 2021-05-01 DIAGNOSIS — K2980 Duodenitis without bleeding: Secondary | ICD-10-CM | POA: Diagnosis not present

## 2021-05-01 DIAGNOSIS — R109 Unspecified abdominal pain: Secondary | ICD-10-CM | POA: Diagnosis not present

## 2021-05-01 DIAGNOSIS — F10188 Alcohol abuse with other alcohol-induced disorder: Secondary | ICD-10-CM | POA: Diagnosis not present

## 2021-05-01 DIAGNOSIS — Z809 Family history of malignant neoplasm, unspecified: Secondary | ICD-10-CM

## 2021-05-01 DIAGNOSIS — Z789 Other specified health status: Secondary | ICD-10-CM | POA: Diagnosis not present

## 2021-05-01 DIAGNOSIS — Z885 Allergy status to narcotic agent status: Secondary | ICD-10-CM

## 2021-05-01 LAB — URINALYSIS, ROUTINE W REFLEX MICROSCOPIC
Glucose, UA: NEGATIVE mg/dL
Hgb urine dipstick: NEGATIVE
Ketones, ur: NEGATIVE mg/dL
Leukocytes,Ua: NEGATIVE
Nitrite: NEGATIVE
Protein, ur: NEGATIVE mg/dL
Specific Gravity, Urine: 1.011 (ref 1.005–1.030)
pH: 5 (ref 5.0–8.0)

## 2021-05-01 LAB — COMPREHENSIVE METABOLIC PANEL
ALT: 52 U/L — ABNORMAL HIGH (ref 0–44)
AST: 105 U/L — ABNORMAL HIGH (ref 15–41)
Albumin: 2 g/dL — ABNORMAL LOW (ref 3.5–5.0)
Alkaline Phosphatase: 282 U/L — ABNORMAL HIGH (ref 38–126)
Anion gap: 11 (ref 5–15)
BUN: 56 mg/dL — ABNORMAL HIGH (ref 6–20)
CO2: 15 mmol/L — ABNORMAL LOW (ref 22–32)
Calcium: 8.3 mg/dL — ABNORMAL LOW (ref 8.9–10.3)
Chloride: 103 mmol/L (ref 98–111)
Creatinine, Ser: 3.13 mg/dL — ABNORMAL HIGH (ref 0.61–1.24)
GFR, Estimated: 23 mL/min — ABNORMAL LOW (ref >60)
Glucose, Bld: 172 mg/dL — ABNORMAL HIGH (ref 70–99)
Potassium: 3.2 mmol/L — ABNORMAL LOW (ref 3.5–5.1)
Sodium: 129 mmol/L — ABNORMAL LOW (ref 135–145)
Total Bilirubin: 29.7 mg/dL (ref 0.3–1.2)
Total Protein: 6.2 g/dL — ABNORMAL LOW (ref 6.5–8.1)

## 2021-05-01 LAB — PROTIME-INR
INR: 1.4 — ABNORMAL HIGH (ref 0.8–1.2)
Prothrombin Time: 16.7 seconds — ABNORMAL HIGH (ref 11.4–15.2)

## 2021-05-01 LAB — CBC
HCT: 37.1 % — ABNORMAL LOW (ref 39.0–52.0)
Hemoglobin: 13.8 g/dL (ref 13.0–17.0)
MCH: 35.6 pg — ABNORMAL HIGH (ref 26.0–34.0)
MCHC: 37.2 g/dL — ABNORMAL HIGH (ref 30.0–36.0)
MCV: 95.6 fL (ref 80.0–100.0)
Platelets: 218 10*3/uL (ref 150–400)
RBC: 3.88 MIL/uL — ABNORMAL LOW (ref 4.22–5.81)
RDW: 15.8 % — ABNORMAL HIGH (ref 11.5–15.5)
WBC: 17.4 10*3/uL — ABNORMAL HIGH (ref 4.0–10.5)
nRBC: 0 % (ref 0.0–0.2)

## 2021-05-01 LAB — LIPASE, BLOOD: Lipase: 134 U/L — ABNORMAL HIGH (ref 11–51)

## 2021-05-01 MED ORDER — LACTULOSE 10 GM/15ML PO SOLN
10.0000 g | Freq: Every day | ORAL | Status: DC
  Administered 2021-05-02 – 2021-05-05 (×4): 10 g via ORAL
  Filled 2021-05-01 (×3): qty 15
  Filled 2021-05-01: qty 30

## 2021-05-01 MED ORDER — ONDANSETRON HCL 4 MG PO TABS: 4.0000 mg | ORAL_TABLET | Freq: Four times a day (QID) | ORAL | Status: DC | PRN

## 2021-05-01 MED ORDER — ONDANSETRON HCL 4 MG/2ML IJ SOLN
4.0000 mg | Freq: Four times a day (QID) | INTRAMUSCULAR | Status: DC | PRN
  Administered 2021-05-02: 23:00:00 4 mg via INTRAVENOUS
  Filled 2021-05-01: qty 2

## 2021-05-01 MED ORDER — THIAMINE HCL 100 MG PO TABS
100.0000 mg | ORAL_TABLET | Freq: Every day | ORAL | Status: DC
  Administered 2021-05-02 – 2021-05-05 (×4): 100 mg via ORAL
  Filled 2021-05-01 (×4): qty 1

## 2021-05-01 MED ORDER — ADULT MULTIVITAMIN W/MINERALS CH
1.0000 | ORAL_TABLET | Freq: Every day | ORAL | Status: DC
  Administered 2021-05-02 – 2021-05-05 (×4): 1 via ORAL
  Filled 2021-05-01 (×4): qty 1

## 2021-05-01 MED ORDER — FOLIC ACID 1 MG PO TABS
1.0000 mg | ORAL_TABLET | Freq: Every day | ORAL | Status: DC
  Administered 2021-05-02 – 2021-05-05 (×4): 1 mg via ORAL
  Filled 2021-05-01 (×4): qty 1

## 2021-05-01 MED ORDER — PREDNISONE 20 MG PO TABS
40.0000 mg | ORAL_TABLET | Freq: Every day | ORAL | Status: DC
  Administered 2021-05-02 – 2021-05-05 (×4): 40 mg via ORAL
  Filled 2021-05-01 (×4): qty 2

## 2021-05-01 MED ORDER — TRAMADOL HCL 50 MG PO TABS: 50.0000 mg | ORAL_TABLET | Freq: Four times a day (QID) | ORAL | Status: DC | PRN

## 2021-05-01 MED ORDER — HEPARIN SODIUM (PORCINE) 5000 UNIT/ML IJ SOLN
5000.0000 [IU] | Freq: Three times a day (TID) | INTRAMUSCULAR | Status: DC
  Administered 2021-05-01 – 2021-05-05 (×11): 5000 [IU] via SUBCUTANEOUS
  Filled 2021-05-01 (×11): qty 1

## 2021-05-01 MED ORDER — THIAMINE HCL 100 MG/ML IJ SOLN: 100.0000 mg | Freq: Every day | INTRAMUSCULAR | Status: DC

## 2021-05-01 MED ORDER — ALBUMIN HUMAN 25 % IV SOLN
25.0000 g | Freq: Three times a day (TID) | INTRAVENOUS | Status: DC
  Administered 2021-05-01 – 2021-05-05 (×11): 25 g via INTRAVENOUS
  Filled 2021-05-01 (×11): qty 100

## 2021-05-01 MED ORDER — POTASSIUM CHLORIDE CRYS ER 20 MEQ PO TBCR
40.0000 meq | EXTENDED_RELEASE_TABLET | Freq: Once | ORAL | Status: AC
  Administered 2021-05-01: 40 meq via ORAL
  Filled 2021-05-01: qty 2

## 2021-05-01 MED ORDER — RIFAXIMIN 550 MG PO TABS
550.0000 mg | ORAL_TABLET | Freq: Two times a day (BID) | ORAL | Status: DC
  Administered 2021-05-01 – 2021-05-05 (×7): 550 mg via ORAL
  Filled 2021-05-01 (×11): qty 1

## 2021-05-01 NOTE — Assessment & Plan Note (Addendum)
See nephrology consult note.  Most concerning for acute HRS 1. 25g albumin 2. Not on any diuretics, HTN meds to hold 3. No obstruction based on bedside US 4. Urine lytes

## 2021-05-01 NOTE — ED Provider Notes (Signed)
[facility] Emergency Department Provider Note MRN:  [id number]  Arrival date & time: 05/01/21     Chief Complaint   Abnormal Lab   History of Present Illness   Christopher Carrillo is a 51 y.o. year-old male with a history of alcoholic hepatitis, anemia, anxiety, hypertension presenting to the ED with chief complaint of abnormal lab value.   Patient was admitted with alcoholic hepatitis earlier this month.  Patient follows with GI as an outpatient.  They were trending his labs weekly.  The patient's recent labs showed a creatinine which increased from 1.0-3.7 between 1/13 and 1/20.  The patient denies that he is having any difficulty producing urine.  Patient reports that he has had adequate fluid hydration.  Patient denies any bleeding.  Patient has not used any new nephrotoxic medications.  Specifically denies NSAID use.  Patient has been on steroids since his last admission.  Patient denies trauma to his abdomen.  Denies chest pain, shortness of breath, abdominal pain.  States that he would not be in the emergency department if he had not had abnormal labs.   Review of Systems  A thorough review of systems was obtained and all systems are negative except as noted in the HPI and PMH.   Patient's Health History    Past Medical History:  Diagnosis Date   Abdominal pain    Alcoholic hepatitis without ascites    Anemia    Anxiety    Arthritis    Elevated bilirubin    Essential hypertension    Psoriasis    Transaminitis     Past Surgical History:  Procedure Laterality Date   BIOPSY  07/20/2019   Procedure: BIOPSY;  Surgeon: Daneil Dolin, MD;  [facility];  Service: Endoscopy;;   BIOPSY  08/14/2019   Procedure: BIOPSY;  Surgeon: Thornton Park, MD;  Location: [place];  Service: Gastroenterology;;   COLONOSCOPY     COLONOSCOPY WITH PROPOFOL N/A 07/20/2019   Procedure: COLONOSCOPY WITH PROPOFOL;  Surgeon: Daneil Dolin, MD;   Location[facility];  Service: Endoscopy;  Laterality: N/A;  10:15am   COLONOSCOPY WITH PROPOFOL N/A 08/14/2019   Procedure: COLONOSCOPY WITH PROPOFOL;  Surgeon: Thornton Park, MD;  Location: [place];  Service: Gastroenterology;  Laterality: N/A;   IR PARACENTESIS  08/14/2019   POLYPECTOMY  07/20/2019   Procedure: POLYPECTOMY;  Surgeon: Daneil Dolin, MD;  Location[facility];  Service: Endoscopy;;   SUBMUCOSAL TATTOO INJECTION  08/14/2019   Procedure: SUBMUCOSAL TATTOO INJECTION;  Surgeon: Thornton Park, MD;  Location: [facility];  Service: Gastroenterology;;    Family History  Problem Relation Age of Onset   Asthma Father    Cancer Sister 74       Pharyngeal   Colon cancer Neg Hx    Liver disease Neg Hx    Esophageal cancer Neg Hx    Rectal cancer Neg Hx    Stomach cancer Neg Hx     Social History   Socioeconomic History   Marital status: Married    Spouse name: Not on file   Number of children: Not on file   Years of education: Not on file   Highest education level: Not on file  Occupational History   Not on file  Tobacco Use   Smoking status: Every Day    Packs/day: 0.33    Types: Cigarettes   Smokeless tobacco: Never  Vaping Use   Vaping Use: Never used  Substance and Sexual  Activity   Alcohol use: Yes    Alcohol/week: 1.0 standard drink    Types: 1 Cans of beer per week    Comment: occasionally   Drug use: No   Sexual activity: Yes  Other Topics Concern   Not on file  Social History Narrative   Not on file   Social Determinants of Health   Financial Resource Strain: Not on file  Food Insecurity: Not on file  Transportation Needs: Not on file  Physical Activity: Not on file  Stress: Not on file  Social Connections: Not on file  Intimate Partner Violence: Not on file     Physical Exam   Vitals:   05/01/21 2000 05/01/21 2130  BP: 135/68 117/69  Pulse: 92 90  Resp: 14 14  Temp:    SpO2: 100% 100%    Physical  Exam Constitutional:      Appearance: Normal appearance.     Comments: Jaundiced   HENT:     Head: Normocephalic and atraumatic.     Right Ear: External ear normal.     Left Ear: External ear normal.     Nose: Nose normal.  Eyes:     Extraocular Movements: Extraocular movements intact.     Conjunctiva/sclera: Conjunctivae normal.     Pupils: Pupils are equal, round, and reactive to light.  Cardiovascular:     Rate and Rhythm: Normal rate and regular rhythm.     Pulses: Normal pulses.     Heart sounds: Normal heart sounds.  Pulmonary:     Effort: Pulmonary effort is normal.     Breath sounds: Normal breath sounds.  Abdominal:     Tenderness: There is no abdominal tenderness. There is no right CVA tenderness or left CVA tenderness.  Musculoskeletal:        General: Normal range of motion.     Cervical back: Normal range of motion and neck supple.     Right lower leg: No edema.     Left lower leg: No edema.  Skin:    General: Skin is warm and dry.     Capillary Refill: Capillary refill takes less than 2 seconds.     Coloration: Skin is jaundiced.  Neurological:     General: No focal deficit present.     Mental Status: He is alert and oriented to person, place, and time. Mental status is at baseline.     Diagnostic and Interventional Summary    Labs Reviewed  LIPASE, BLOOD - Abnormal; Notable for the following components:      Result Value   Lipase 134 (*)    All other components within normal limits  COMPREHENSIVE METABOLIC PANEL - Abnormal; Notable for the following components:   Sodium 129 (*)    Potassium 3.2 (*)    CO2 15 (*)    Glucose, Bld 172 (*)    BUN 56 (*)    Creatinine, Ser 3.13 (*)    Calcium 8.3 (*)    Total Protein 6.2 (*)    Albumin 2.0 (*)    AST 105 (*)    ALT 52 (*)    Alkaline Phosphatase 282 (*)    Total Bilirubin 29.7 (*)    GFR, Estimated 23 (*)    All other components within normal limits  CBC - Abnormal; Notable for the following  components:   WBC 17.4 (*)    RBC 3.88 (*)    HCT 37.1 (*)    MCH 35.6 (*)    MCHC  37.2 (*)    RDW 15.8 (*)    All other components within normal limits  URINALYSIS, ROUTINE W REFLEX MICROSCOPIC - Abnormal; Notable for the following components:   Color, Urine AMBER (*)    APPearance HAZY (*)    Bilirubin Urine MODERATE (*)    All other components within normal limits  PROTIME-INR - Abnormal; Notable for the following components:   Prothrombin Time 16.7 (*)    INR 1.4 (*)    All other components within normal limits  NA AND K (SODIUM & POTASSIUM), RAND UR  OSMOLALITY, URINE  SODIUM, URINE, RANDOM  PROTEIN / CREATININE RATIO, URINE  CBC  COMPREHENSIVE METABOLIC PANEL    No orders to display    Medications  albumin human 25 % solution 25 g (has no administration in time range)  heparin injection 5,000 Units (5,000 Units Subcutaneous Given 05/01/21 2327)  ondansetron (ZOFRAN) tablet 4 mg (has no administration in time range)    Or  ondansetron (ZOFRAN) injection 4 mg (has no administration in time range)  thiamine tablet 100 mg (has no administration in time range)    Or  thiamine (B-1) injection 100 mg (has no administration in time range)  folic acid (FOLVITE) tablet 1 mg (has no administration in time range)  multivitamin with minerals tablet 1 tablet (has no administration in time range)  lactulose (CHRONULAC) 10 GM/15ML solution 10 g (has no administration in time range)  predniSONE (DELTASONE) tablet 40 mg (has no administration in time range)  rifaximin (XIFAXAN) tablet 550 mg (550 mg Oral Given 05/01/21 2327)  traMADol (ULTRAM) tablet 50 mg (has no administration in time range)  potassium chloride SA (KLOR-CON M) CR tablet 40 mEq (40 mEq Oral Given 05/01/21 2049)     Procedures  /  Critical Care Procedures  ED Course and Medical Decision Making  Initial Impression and Ddx This is a 51 year old male who presents to emergency department at the recommendation of his  family medicine provider.   Denies any recent nephrotoxic medications.  Denies prerenal or postrenal symptoms at this time.  Differential diagnosis includes nephrotoxic medications, prerenal, postrenal, or intrarenal deficit my leading differential diagnosis at this time is hepatorenal syndrome.  Past medical/surgical history that increases complexity of ED encounter: Alcoholic hepatitis  Interpretation of Diagnostics I personally reviewed the Cardiac Monitor and my interpretation is as follows: Normal sinus rhythm on the monitor.    Patient has a significant increase in his creatinine from less than a month ago.  Urinalysis reviewed and was without proteinuria.  Patient also has significant elevations in his liver function panel as well as his bilirubin.  Patient Reassessment and Ultimate Disposition/Management Suspect that the patient's symptoms are related to a prerenal syndrome.  Will consult hospitalist for admission.  Patient management required discussion with the following services or consulting groups:  Hospitalist Service  Complexity of Problems Addressed Chronic illness with severe exacerbation  Additional Data Reviewed and Analyzed Further history obtained from: Recent PCP notes and Recent Consult notes    Final Clinical Impressions(s) / ED Diagnoses     ICD-10-CM   1. AKI (acute kidney injury) ([place])  N17.9 CANCELED: US RENAL    CANCELED: US RENAL      ED Discharge Orders     None        Discharge Instructions Discussed with and Provided to Patient:   Discharge Instructions   None       Zachery Dakins, MD 05/01/21 2330  Lorelle Gibbs, DO 05/02/21 2325

## 2021-05-01 NOTE — Assessment & Plan Note (Signed)
CIWA Thiamine, folate, MV Holding off on ativan orders for now.

## 2021-05-01 NOTE — Assessment & Plan Note (Addendum)
Tells [place] abstinent since 3 weeks ago, but told GI at office visit on 1/9 that he had some EtOH after hospital admit. 1. Continue prednisone

## 2021-05-01 NOTE — Assessment & Plan Note (Addendum)
Pt with h/o decompensations including encephalopathy and ascites. Now worried about acute HRS. 1. GI consult in AM 2. Continue lactulose 3. Continue Rifaxan 4. Not on any BBs, diuretics at this time. 5. Poor overall prognosis - likely warrants pal care consult.

## 2021-05-01 NOTE — ED Triage Notes (Signed)
Pt reports having liver failure. Has blood drawn regularly, had it done Friday and was called to come to ED today due to kidney failure, elevated BUN and Cr. Patient reports dizziness and generalized weakness. No acute distress is noted at this time.

## 2021-05-01 NOTE — ED Provider Triage Note (Signed)
Emergency Medicine Provider Triage Evaluation Note  Christopher Carrillo , a 51 y.o. male  was evaluated in triage.  Pt has a history of liver failure and was seen on Friday for a blood draw and called today to go to the emergency department due to renal failure.  No history of kidney disease.  States he is also felt dizzy and weak.  Review of Systems  Positive: Abdominal pain, dizziness and weakness Negative: Chest pain or shortness of breath or lower extremity edema  Physical Exam  BP 111/72 (BP Location: Right Arm)    Pulse 88    Temp (!) 97.5 F (36.4 C) (Oral)    Resp 16    SpO2 100%  Gen:   Awake, no distress   Resp:  Normal effort  MSK:   Moves extremities without difficulty  Other:  Very jaundiced, distended abdomen  Medical Decision Making  Medically screening exam initiated at 4:12 PM.  Appropriate orders placed.  Christopher Carrillo was informed that the remainder of the evaluation will be completed by another provider, this initial triage assessment does not replace that evaluation, and the importance of remaining in the ED until their evaluation is complete.     Christopher Carrillo, [place] 05/01/21 [phone]

## 2021-05-01 NOTE — Assessment & Plan Note (Signed)
1. Getting 25g albumin 2. See nephro consult note

## 2021-05-01 NOTE — Consult Note (Addendum)
Christopher Carrillo Admit Date: 05/01/2021 05/01/2021 Christopher Carrillo Requesting Physician:  Hurley Cisco  Reason for Consult:  AKI HPI:  33F with  historical normal GFR but history of recurrent alcoholic hepatitis including flare at the end of 2022, consequential cirrhosis who had blood work done on 1/20 and creatinine had increased to 3.76 for which she was instructed to come to the ED present earlier today.  Here his creatinine is 3.13, BUN 56, bicarbonate 15 with anion gap of 11, sodium 129.  Urine analysis without protein, hemoglobin, leukocytes or nitrites.  MRI 04/04/2021 with normal-sized kidneys and no obstructive findings.  His bilirubin was 37 on 1/20 and is 29.7 today.  INR is 1.4 with a platelet count 218, albumin 2.0.  He has some history of portal hypertension but no history of large volume ascites.  He states he has had some abdominal pain on and off.  He denies use of any nonsteroidals.  No ACE inhibitor's or ARB's on his list.  No diuretics on his list.  He describes his appetite is poor.  He denies use of alcohol in the past 3 weeks.  PMH Incudes: Hypertension   Creat (mg/dL)  Date Value  04/28/2021 3.76 (H)  04/21/2021 1.04  04/14/2021 0.74  03/30/2021 0.55 (L)  03/28/2021 0.56 (L)  02/06/2021 0.62  12/01/2020 0.65  10/14/2020 0.64  06/20/2020 0.76  04/05/2020 0.69   Creatinine, Ser (mg/dL)  Date Value  05/01/2021 3.13 (H)  04/17/2021 0.83  04/10/2021 0.45 (L)  04/07/2021 <0.30 (L)  04/06/2021 <0.30 (L)  04/05/2021 <0.30 (L)  04/04/2021 0.43 (L)  04/03/2021 0.47 (L)  09/30/2020 0.59 (L)  09/29/2020 0.51 (L)  ]  ROS NSAIDS: Denies IV Contrast no exposure TMP/SMX no exposure Hypotension not present Balance of 12 systems is negative w/ exceptions as above  PMH  Past Medical History:  Diagnosis Date   Abdominal pain    Alcoholic hepatitis without ascites    Anemia    Anxiety    Arthritis    Elevated bilirubin    Essential hypertension    Psoriasis     Transaminitis    PSH  Past Surgical History:  Procedure Laterality Date   BIOPSY  07/20/2019   Procedure: BIOPSY;  Surgeon: Daneil Dolin, MD;  [facility];  Service: Endoscopy;;   BIOPSY  08/14/2019   Procedure: BIOPSY;  Surgeon: Thornton Park, MD;  Location: [place];  Service: Gastroenterology;;   COLONOSCOPY     COLONOSCOPY WITH PROPOFOL N/A 07/20/2019   Procedure: COLONOSCOPY WITH PROPOFOL;  Surgeon: Daneil Dolin, MD;  [facility];  Service: Endoscopy;  Laterality: N/A;  10:15am   COLONOSCOPY WITH PROPOFOL N/A 08/14/2019   Procedure: COLONOSCOPY WITH PROPOFOL;  Surgeon: Thornton Park, MD;  Location: [place];  Service: Gastroenterology;  Laterality: N/A;   IR PARACENTESIS  08/14/2019   POLYPECTOMY  07/20/2019   Procedure: POLYPECTOMY;  Surgeon: Daneil Dolin, MD;  [facility];  Service: Endoscopy;;   SUBMUCOSAL TATTOO INJECTION  08/14/2019   Procedure: SUBMUCOSAL TATTOO INJECTION;  Surgeon: Thornton Park, MD;  Location: [facility];  Service: Gastroenterology;;   FH  Family History  Problem Relation Age of Onset   Asthma Father    Cancer Sister 70       Pharyngeal   Colon cancer Neg Hx    Liver disease Neg Hx    Esophageal cancer Neg Hx    Rectal cancer Neg Hx    Stomach cancer Neg Hx  SH  reports that he has been smoking cigarettes. He has been smoking an average of .33 packs per day. He has never used smokeless tobacco. He reports current alcohol use of about 1.0 standard drink per week. He reports that he does not use drugs. Allergies  Allergies  Allergen Reactions   Oxycodone Itching   Home medications Prior to Admission medications   Medication Sig Start Date End Date Taking? Authorizing Provider  folic acid (FOLVITE) 1 MG tablet Take 1 tablet (1 mg total) by mouth daily. 04/08/21  Yes Emokpae, Courage, MD  lactulose (CHRONULAC) 10 GM/15ML solution Take 15 mLs (10 g total) by mouth daily. 04/08/21   Yes Emokpae, Courage, MD  Multiple Vitamins-Minerals (MULTIVITAMIN WITH MINERALS) tablet Take 1 tablet by mouth daily. 04/07/21 04/07/22 Yes Emokpae, Courage, MD  ondansetron (ZOFRAN) 4 MG tablet Take 1 tablet (4 mg total) by mouth every 6 (six) hours as needed for nausea. 04/07/21  Yes Emokpae, Courage, MD  potassium chloride SA (KLOR-CON M) 20 MEQ tablet Take 1 tablet (20 mEq total) by mouth daily. 04/10/21  Yes Rehman, Mechele Dawley, MD  predniSONE (DELTASONE) 20 MG tablet Take 2 tablets (40 mg total) by mouth daily with breakfast. 04/14/21  Yes Pickard, Cammie Mcgee, MD  rifaximin (XIFAXAN) 550 MG TABS tablet Take 1 tablet (550 mg total) by mouth 2 (two) times daily. 04/04/21  Yes Susy Frizzle, MD  thiamine 100 MG tablet Take 1 tablet (100 mg total) by mouth daily. 04/17/21  Yes Esterwood, Amy S, PA-C  traMADol (ULTRAM) 50 MG tablet Take 1 tablet (50 mg total) by mouth every 6 (six) hours as needed. Patient taking differently: Take 50 mg by mouth every 6 (six) hours as needed for severe pain or moderate pain. 04/21/21 05/21/21 Yes Susy Frizzle, MD    Current Medications Scheduled Meds: Continuous Infusions: PRN Meds:.  CBC Recent Labs  Lab 04/28/21 1147 05/01/21 1450  WBC 16.7* 17.4*  NEUTROABS 14,112*  --   HGB 14.0 13.8  HCT 35.6* 37.1*  MCV 89.2 95.6  PLT 223 734   Basic Metabolic Panel Recent Labs  Lab 04/28/21 1147 05/01/21 1450  NA 129* 129*  K 3.5 3.2*  CL 100 103  CO2 16* 15*  GLUCOSE 115* 172*  BUN 47* 56*  CREATININE 3.76* 3.13*  CALCIUM 8.6 8.3*    Physical Exam   Blood pressure 117/69, pulse 90, temperature (!) 97.5 F (36.4 C), temperature source Oral, resp. rate 14, SpO2 100 %. GEN: Icteric, unwell appearing ENT: NCAT EYES: Scleral icterus present CV: Regular, normal S1 and S2 PULM: Clear bilaterally ABD: Not terribly distended, soft, mild tenderness to deep palpation, no rebound or guarding SKIN: Jaundiced as above EXT: No peripheral edema NEURO:  answers basic questions, nonfocal  Assessment 47M with recent alcoholic hepatitis/progressive cirrhosis with AKI, acidosis, hyponatremia, hyperbilirubinemia  AKI, nl baseline GFR.  Likely etiologies include prerenal, type I HRS Recent alcoholic hepatitis, cirrhosis, marked hyperbilirubinemia and persistent transaminitis Alcohol abuse, states abstinent since 3 weeks ago Mild hyponatremia with hypervolemia related to liver disease and impaired free water excretion Metabolic acidosis, mild  Plan Most likely this is HRS from recent hepatic decompensation but also prerenal possible.  No nephrotoxic exposures or acute hemodynamic insults.  I doubt obstruction with reassuring imaging less than a month ago.  Urinalysis today without hematuria, proteinuria, pyuria.  Plan on colloid resuscitation and if fails to improve consideration of midodrine/octreotide.  Follow-up with urine electrolytes, urine protein creatinine ratio. Overall prognosis  very poor with severe liver disease and no renal failure, recommend palliative consult Will follow along Daily weights, Daily Renal Panel, Strict I/Os, Avoid nephrotoxins (NSAIDs, judicious IV Contrast)    Christopher Carrillo  05/01/2021, 9:49 PM

## 2021-05-01 NOTE — H&P (Signed)
History and Physical    Patient: Christopher Carrillo LXB:[id number] DOB: [birth year] DOA: 05/01/2021 DOS: the patient was seen and examined on 05/01/2021 PCP: Susy Frizzle, MD  Patient coming from: Home  Chief Complaint: AKI  HPI: Christopher Carrillo is a 51 y.o. male with medical history significant of EtOH abuse, cirrhosis.  Pt admitted with EtOH hepatitis earlier this month.  Following with [facility] in office.  Recent labs showed jump of creat from 1.0 to 3.7 between 1/13 and 1/20.  Got called and sent in to ED today where creat is 3.1.  He reports dizziness and generalized weakness.  Has had some abd pain off and on.  No NSAID use.  Has been on steroids since last admit due to elevated Maddrey's.    Review of Systems: As mentioned in the history of present illness. All other systems reviewed and are negative. Past Medical History:  Diagnosis Date   Abdominal pain    Alcoholic hepatitis without ascites    Anemia    Anxiety    Arthritis    Elevated bilirubin    Essential hypertension    Psoriasis    Transaminitis    Past Surgical History:  Procedure Laterality Date   BIOPSY  07/20/2019   Procedure: BIOPSY;  Surgeon: Daneil Dolin, MD;  [facility];  Service: Endoscopy;;   BIOPSY  08/14/2019   Procedure: BIOPSY;  Surgeon: Thornton Park, MD;  Location: [place];  Service: Gastroenterology;;   COLONOSCOPY     COLONOSCOPY WITH PROPOFOL N/A 07/20/2019   Procedure: COLONOSCOPY WITH PROPOFOL;  Surgeon: Daneil Dolin, MD;  [facility];  Service: Endoscopy;  Laterality: N/A;  10:15am   COLONOSCOPY WITH PROPOFOL N/A 08/14/2019   Procedure: COLONOSCOPY WITH PROPOFOL;  Surgeon: Thornton Park, MD;  Location: [place];  Service: Gastroenterology;  Laterality: N/A;   IR PARACENTESIS  08/14/2019   POLYPECTOMY  07/20/2019   Procedure: POLYPECTOMY;  Surgeon: Daneil Dolin, MD;  [facility];  Service: Endoscopy;;   SUBMUCOSAL  TATTOO INJECTION  08/14/2019   Procedure: SUBMUCOSAL TATTOO INJECTION;  Surgeon: Thornton Park, MD;  Location: [place];  Service: Gastroenterology;;   Social History:  reports that he has been smoking cigarettes. He has been smoking an average of .33 packs per day. He has never used smokeless tobacco. He reports current alcohol use of about 1.0 standard drink per week. He reports that he does not use drugs.  Allergies  Allergen Reactions   Oxycodone Itching    Family History  Problem Relation Age of Onset   Asthma Father    Cancer Sister 75       Pharyngeal   Colon cancer Neg Hx    Liver disease Neg Hx    Esophageal cancer Neg Hx    Rectal cancer Neg Hx    Stomach cancer Neg Hx     Prior to Admission medications   Medication Sig Start Date End Date Taking? Authorizing Provider  folic acid (FOLVITE) 1 MG tablet Take 1 tablet (1 mg total) by mouth daily. 04/08/21  Yes Emokpae, Courage, MD  lactulose (CHRONULAC) 10 GM/15ML solution Take 15 mLs (10 g total) by mouth daily. 04/08/21  Yes Emokpae, Courage, MD  Multiple Vitamins-Minerals (MULTIVITAMIN WITH MINERALS) tablet Take 1 tablet by mouth daily. 04/07/21 04/07/22 Yes Emokpae, Courage, MD  ondansetron (ZOFRAN) 4 MG tablet Take 1 tablet (4 mg total) by mouth every 6 (six) hours as needed for nausea. 04/07/21  Yes Emokpae, Courage,  MD  potassium chloride SA (KLOR-CON M) 20 MEQ tablet Take 1 tablet (20 mEq total) by mouth daily. 04/10/21  Yes Rehman, Mechele Dawley, MD  predniSONE (DELTASONE) 20 MG tablet Take 2 tablets (40 mg total) by mouth daily with breakfast. 04/14/21  Yes Pickard, Cammie Mcgee, MD  rifaximin (XIFAXAN) 550 MG TABS tablet Take 1 tablet (550 mg total) by mouth 2 (two) times daily. 04/04/21  Yes Susy Frizzle, MD  thiamine 100 MG tablet Take 1 tablet (100 mg total) by mouth daily. 04/17/21  Yes Esterwood, Amy S, PA-C  traMADol (ULTRAM) 50 MG tablet Take 1 tablet (50 mg total) by mouth every 6 (six) hours as  needed. Patient taking differently: Take 50 mg by mouth every 6 (six) hours as needed for severe pain or moderate pain. 04/21/21 05/21/21 Yes Susy Frizzle, MD    Physical Exam: Vitals:   05/01/21 1748 05/01/21 1930 05/01/21 2000 05/01/21 2130  BP: 118/81 128/75 135/68 117/69  Pulse: 98 89 92 90  Resp: 17 14 14 14   Temp:      TempSrc:      SpO2: 99% 100% 100% 100%   Constitutional: NAD, calm, comfortable Eyes: PERRL, lids and conjunctivae icteric ENMT: Mucous membranes are moist. Posterior pharynx clear of any exudate or lesions.Normal dentition.  Neck: normal, supple, no masses, no thyromegaly Respiratory: clear to auscultation bilaterally, no wheezing, no crackles. Normal respiratory effort. No accessory muscle use.  Cardiovascular: Regular rate and rhythm, no murmurs / rubs / gallops. No extremity edema. 2+ pedal pulses. No carotid bruits.  Abdomen: no tenderness, no masses palpated. No hepatosplenomegaly. Bowel sounds positive.  Musculoskeletal: no clubbing / cyanosis. No joint deformity upper and lower extremities. Good ROM, no contractures. Normal muscle tone.  Skin: no rashes, lesions, ulcers. No induration Neurologic: CN 2-12 grossly intact. Sensation intact, DTR normal. Strength 5/5 in all 4.  Psychiatric: Normal judgment and insight. Alert and oriented x 3. Normal mood.    Data Reviewed:  Creat 3.1 up from 1.0 x10 days ago Albumin 2.0 T.Bili 29 is similar (slightly improved) from admission earlier this month. INR 1.4 unchanged. AST 105 and ALT 52 essentially unchanged from 10 days ago.  Assessment/Plan * Acute kidney failure ([place])- (present on admission) See nephrology consult note.  Most concerning for acute HRS 25g albumin Not on any diuretics, HTN meds to hold No obstruction based on bedside US Urine lytes  ETOH abuse- (present on admission) CIWA Thiamine, folate, MV Holding off on ativan orders for now.  Hepatorenal syndrome ([place])- (present on  admission) Getting 25g albumin See nephro consult note  Alcoholic cirrhosis ([place])- (present on admission) Pt with h/o decompensations including encephalopathy and ascites. Now worried about acute HRS. GI consult in AM Continue lactulose Continue Rifaxan Not on any BBs, diuretics at this time. Poor overall prognosis - likely warrants pal care consult.  Alcoholic hepatitis- (present on admission) Tells [place] abstinent since 3 weeks ago, but told GI at office visit on 1/9 that he had some EtOH after hospital admit. Continue prednisone    Advance Care Planning:   Code Status: Full Code  Consults: nephrology, GI  Family Communication: No family in room  Severity of Illness: The appropriate patient status for this patient is INPATIENT. Inpatient status is judged to be reasonable and necessary in order to provide the required intensity of service to ensure the patient's safety. The patient's presenting symptoms, physical exam findings, and initial radiographic and laboratory data in the context of their chronic  comorbidities is felt to place them at high risk for further clinical deterioration. Furthermore, it is not anticipated that the patient will be medically stable for discharge from the hospital within 2 midnights of admission.   * I certify that at the point of admission it is my clinical judgment that the patient will require inpatient hospital care spanning beyond 2 midnights from the point of admission due to high intensity of service, high risk for further deterioration and high frequency of surveillance required.*  Author: Etta Quill., DO 05/01/2021 10:41 PM  For on call review [email or web address].

## 2021-05-02 ENCOUNTER — Inpatient Hospital Stay (HOSPITAL_COMMUNITY): Payer: Managed Care, Other (non HMO)

## 2021-05-02 DIAGNOSIS — Z515 Encounter for palliative care: Secondary | ICD-10-CM

## 2021-05-02 DIAGNOSIS — R188 Other ascites: Secondary | ICD-10-CM

## 2021-05-02 DIAGNOSIS — K7010 Alcoholic hepatitis without ascites: Secondary | ICD-10-CM | POA: Diagnosis not present

## 2021-05-02 DIAGNOSIS — K7031 Alcoholic cirrhosis of liver with ascites: Secondary | ICD-10-CM | POA: Diagnosis not present

## 2021-05-02 DIAGNOSIS — N178 Other acute kidney failure: Secondary | ICD-10-CM | POA: Diagnosis not present

## 2021-05-02 DIAGNOSIS — Z7189 Other specified counseling: Secondary | ICD-10-CM

## 2021-05-02 DIAGNOSIS — N179 Acute kidney failure, unspecified: Secondary | ICD-10-CM | POA: Diagnosis not present

## 2021-05-02 DIAGNOSIS — R638 Other symptoms and signs concerning food and fluid intake: Secondary | ICD-10-CM

## 2021-05-02 DIAGNOSIS — Z789 Other specified health status: Secondary | ICD-10-CM

## 2021-05-02 DIAGNOSIS — F1010 Alcohol abuse, uncomplicated: Secondary | ICD-10-CM | POA: Diagnosis not present

## 2021-05-02 DIAGNOSIS — I8501 Esophageal varices with bleeding: Secondary | ICD-10-CM

## 2021-05-02 DIAGNOSIS — Z711 Person with feared health complaint in whom no diagnosis is made: Secondary | ICD-10-CM

## 2021-05-02 DIAGNOSIS — Z66 Do not resuscitate: Secondary | ICD-10-CM

## 2021-05-02 HISTORY — PX: IR PARACENTESIS: IMG2679

## 2021-05-02 LAB — CBC
HCT: 33.6 % — ABNORMAL LOW (ref 39.0–52.0)
Hemoglobin: 12 g/dL — ABNORMAL LOW (ref 13.0–17.0)
MCH: 34.3 pg — ABNORMAL HIGH (ref 26.0–34.0)
MCHC: 35.7 g/dL (ref 30.0–36.0)
MCV: 96 fL (ref 80.0–100.0)
Platelets: 185 10*3/uL (ref 150–400)
RBC: 3.5 MIL/uL — ABNORMAL LOW (ref 4.22–5.81)
RDW: 15.9 % — ABNORMAL HIGH (ref 11.5–15.5)
WBC: 16.1 10*3/uL — ABNORMAL HIGH (ref 4.0–10.5)
nRBC: 0 % (ref 0.0–0.2)

## 2021-05-02 LAB — COMPREHENSIVE METABOLIC PANEL
ALT: 44 U/L (ref 0–44)
AST: 92 U/L — ABNORMAL HIGH (ref 15–41)
Albumin: 2.2 g/dL — ABNORMAL LOW (ref 3.5–5.0)
Alkaline Phosphatase: 225 U/L — ABNORMAL HIGH (ref 38–126)
Anion gap: 11 (ref 5–15)
BUN: 56 mg/dL — ABNORMAL HIGH (ref 6–20)
CO2: 12 mmol/L — ABNORMAL LOW (ref 22–32)
Calcium: 8.1 mg/dL — ABNORMAL LOW (ref 8.9–10.3)
Chloride: 104 mmol/L (ref 98–111)
Creatinine, Ser: 3.4 mg/dL — ABNORMAL HIGH (ref 0.61–1.24)
GFR, Estimated: 21 mL/min — ABNORMAL LOW (ref >60)
Glucose, Bld: 87 mg/dL (ref 70–99)
Potassium: 3.5 mmol/L (ref 3.5–5.1)
Sodium: 127 mmol/L — ABNORMAL LOW (ref 135–145)
Total Bilirubin: 26.6 mg/dL (ref 0.3–1.2)
Total Protein: 5.4 g/dL — ABNORMAL LOW (ref 6.5–8.1)

## 2021-05-02 LAB — OSMOLALITY, URINE: Osmolality, Ur: 343 mOsm/kg (ref 300–900)

## 2021-05-02 LAB — SODIUM, URINE, RANDOM: Sodium, Ur: <10 mmol/L

## 2021-05-02 LAB — BODY FLUID CELL COUNT WITH DIFFERENTIAL
Eos, Fluid: 0 %
Lymphs, Fluid: 36 %
Monocyte-Macrophage-Serous Fluid: 37 % — ABNORMAL LOW (ref 50–90)
Neutrophil Count, Fluid: 27 % — ABNORMAL HIGH (ref 0–25)
Total Nucleated Cell Count, Fluid: 47 cu mm (ref 0–1000)

## 2021-05-02 LAB — NA AND K (SODIUM & POTASSIUM), RAND UR
Potassium Urine: 37 mmol/L
Sodium, Ur: <10 mmol/L

## 2021-05-02 LAB — GRAM STAIN

## 2021-05-02 LAB — PROTEIN / CREATININE RATIO, URINE
Creatinine, Urine: 114.41 mg/dL
Protein Creatinine Ratio: 0.26 mg/mg{Cre} — ABNORMAL HIGH (ref 0.00–0.15)
Total Protein, Urine: 30 mg/dL

## 2021-05-02 LAB — ALBUMIN, PLEURAL OR PERITONEAL FLUID: Albumin, Fluid: <1.5 g/dL

## 2021-05-02 LAB — PROTIME-INR
INR: 1.6 — ABNORMAL HIGH (ref 0.8–1.2)
Prothrombin Time: 18.8 seconds — ABNORMAL HIGH (ref 11.4–15.2)

## 2021-05-02 MED ORDER — MIDODRINE HCL 5 MG PO TABS
10.0000 mg | ORAL_TABLET | Freq: Two times a day (BID) | ORAL | Status: DC
  Administered 2021-05-02 – 2021-05-05 (×6): 10 mg via ORAL
  Filled 2021-05-02 (×6): qty 2

## 2021-05-02 MED ORDER — HYDROMORPHONE HCL 1 MG/ML IJ SOLN
0.5000 mg | INTRAMUSCULAR | Status: DC | PRN
  Administered 2021-05-02 – 2021-05-04 (×12): 0.5 mg via INTRAVENOUS
  Filled 2021-05-02 (×12): qty 1

## 2021-05-02 MED ORDER — PANTOPRAZOLE SODIUM 40 MG IV SOLR
40.0000 mg | Freq: Two times a day (BID) | INTRAVENOUS | Status: DC
  Administered 2021-05-02 – 2021-05-05 (×7): 40 mg via INTRAVENOUS
  Filled 2021-05-02 (×7): qty 40

## 2021-05-02 MED ORDER — LIDOCAINE HCL 1 % IJ SOLN
INTRAMUSCULAR | Status: AC
  Filled 2021-05-02: qty 20

## 2021-05-02 MED ORDER — LIDOCAINE HCL (PF) 1 % IJ SOLN
INTRAMUSCULAR | Status: DC | PRN
  Administered 2021-05-02: 10 mL

## 2021-05-02 MED ORDER — SODIUM BICARBONATE 650 MG PO TABS
1300.0000 mg | ORAL_TABLET | Freq: Two times a day (BID) | ORAL | Status: DC
  Administered 2021-05-02 – 2021-05-05 (×6): 1300 mg via ORAL
  Filled 2021-05-02 (×7): qty 2

## 2021-05-02 MED ORDER — TRAMADOL HCL 50 MG PO TABS
50.0000 mg | ORAL_TABLET | Freq: Four times a day (QID) | ORAL | Status: DC | PRN
  Administered 2021-05-03 – 2021-05-04 (×4): 50 mg via ORAL
  Filled 2021-05-02 (×4): qty 1

## 2021-05-02 MED ORDER — SODIUM CHLORIDE 0.9 % IV SOLN
50.0000 ug/h | INTRAVENOUS | Status: DC
  Administered 2021-05-02 – 2021-05-05 (×5): 50 ug/h via INTRAVENOUS
  Filled 2021-05-02 (×7): qty 1

## 2021-05-02 MED ORDER — OCTREOTIDE LOAD VIA INFUSION
50.0000 ug | Freq: Once | INTRAVENOUS | Status: AC
  Administered 2021-05-02: 18:00:00 50 ug via INTRAVENOUS
  Filled 2021-05-02: qty 25

## 2021-05-02 NOTE — Procedures (Signed)
PROCEDURE SUMMARY:  Successful US guided paracentesis from right abdomen.  Yielded 500 ml of orange fluid.  No immediate complications.  Pt tolerated well.   Specimen sent for labs.  EBL < 2 mL  Theresa Duty, NP 05/02/2021 3:33 PM

## 2021-05-02 NOTE — Consult Note (Signed)
Consultation  Referring Provider:   Dr. Jennette Kettle   Primary Care Physician:  Susy Frizzle, MD Primary Gastroenterologist:  Dr. Tarri Glenn   Reason for Consultation:   Acute kidney injury in setting of alcoholic cirrhosis   Decompensated cirrhosis secondary continuing ETOH use with history of hepatic encephalopathy and ascites WBC 16.1 HGB 12.0 MCV 96.0 Platelets 185 AST 92 ALT 44  Alkphos 225 TBili 26.6 GFR 21  MELD-Na score: 36  Discriminate Score 52.8, recent steroid use has not helped, on prednisone at this time by primary team, will defer to Dr. Tarri Glenn if this would be beneficial or not.  With these scores and history, very high mortality rate and very poor prognosis Palliative has been consulted -Check CBC, CMET, INR tomorrow  Ascites Mild diffuse tenderness, elevated WBC Will get AB [place] with diagnostic paracentesis if able to rule out SBP, will send off for cell cytology, culture, cell count, protein.  Want to avoid large paracentesis at this time in setting of HRS  Esophageal Varices: Last EGD Dec 2022 without evidence of varices, showed portal gastropathy and mild duodenitis. Have melena/hemtochezia x 1 week without significant drop in H/H With recent EGD likely repeating would be low yield Monitor H/H transfuse to keep around 7 Will start on octeotride for HRS/low risk bleeding, make sure on protonix BID for possible doudentitis  Acute renal failure possibly due to hepatorenal syndrome BUN from 1.0 on 01/13 to 3.7 on 1/20 -Avoid large volume paracentesis -Diuretics on hold, DC antihypertensives --Avoid nephrotoxic drugs -1 g/kg per day of Albumin (max 100 g) for 2 days.  -will place on Octreotide,further recommendations per nephrology, can start midodrine if BP needs or renal not improving  Alcohol abuse -Alcohol Abstinence counseling discussed with patient as continued use is strongly associated with worsening liver disease progression  -Monitor for  withdrawal symptoms while inpatient -Empiric treatment with folic acid, multivitamin and thiamine.  --No role for liver transplantation given ongoing alcohol use  Hepatic Encephalopathy:  No evidence this admission continue meds - Lactulose, 71m titrate to 3bms per day - Rifaximin 5528mbid - minimize/remove all benzos and narcotics  With history of coagulopathy present INR 1.4 Check PT/INR daily (Vitamin K/FFP if elevated for procedure)          HPI:   Christopher Carrillo a [age].o. male established with Dr. BeTarri Glennwith history of severe alcoholic hepatitis with ongoing alcohol use with several treatments of steroids in the past with recent diagnosis of cirrhosis via MRCP 03/2021, history of encephalopathy and acute diverticulitis.   [facility] 04[facility]07/2021 initiated on prednisolone with discriminant function in 37-38 range.  Admitted alcohol use since discharge. Evaluated by [facility] hepatology/[facility] fall 2022 but not a transplant candidate due to ongoing alcohol use.  Has not had ETOH in 3 weeks, no NSAIDS or new medications.  Patient states he has had 1 week of feeling poorly. Started to have nausea and vomiting, nonbloody emesis, p.o. intake. Began to notice black and bright red blood in loose stools for the past week, has had 3 times daily, is on lactulose 30 mL once daily and Xifaxan. Has had worsening abdominal pain left lower quadrant progressing to diffuse.  Does deny fever or chills. Has had weakness and dizziness, denies chest pain or shortness of breath. Patient states his abdomen has been more swollen and has gone down some in the past week, denies edema. Week ago had  intense diffuse itching, states this has resolved. No decrease in urinary output, has had dark urine which is unchanged for the patient.  ED course: Sodium 127, BUN 56, creatinine 3.40, alkaline phosphatase 225, albumin 2.2, AST 92, ALT 44, total bilirubin 26.6 Not  gotten a lactic acid White blood cell count 18.5 on admission currently 16.1, was 18.5 04/17/2021  hemoglobin 12 was 13.8 on admission-baseline between 12 and 13, platelets 185.  Previous GI work up: Last office visit 04/17/2021 with Nicoletta Ba PA for alcoholic cirrhosis Discriminant score at that visit 39, with worsening of labs INR at 1.6, total bilirubin 35.8. Prednisone 40 mg daily for 10 to 12 days showed no response to this was discontinued.  04/14/2021 labs WBC 18,000, hemoglobin 14.1, platelets 279 INR 1.6/pro time 15.8 up from previous T bili 35.8/alk phos 250/AST 74/ALT 31 AFP 2.2   MRCP during hospitalization 04/03/2021, diffuse hepatic steatosis with cirrhosis, no suspicious hepatic lesions, gallbladder distended without gallstones common bile duct 4 mm.   EGD in December 2022 for Dr. Tarri Glenn prior to that hospitalization for variceal surveillance, esophagus was normal noted to have moderate portal gastropathy and mild duodenitis.  Duodenal biopsies were negative, gastric biopsy showed mild nonspecific inflammation and focal intestinal metaplasia no H. pylori.  Past Medical History:  Diagnosis Date   Abdominal pain    Alcoholic hepatitis without ascites    Anemia    Anxiety    Arthritis    Elevated bilirubin    Essential hypertension    Psoriasis    Transaminitis     Surgical History:  He  has a past surgical history that includes Colonoscopy with propofol (N/A, 07/20/2019); biopsy (07/20/2019); polypectomy (07/20/2019); IR Paracentesis (08/14/2019); Colonoscopy with propofol (N/A, 08/14/2019); biopsy (08/14/2019); Submucosal tattoo injection (08/14/2019); and Colonoscopy. Family History:  His family history includes Asthma in his father; Cancer (age of onset: 80) in his sister. Social History:   reports that he has been smoking cigarettes. He has been smoking an average of .33 packs per day. He has never used smokeless tobacco. He reports current alcohol use of  about 1.0 standard drink per week. He reports that he does not use drugs.  Prior to Admission medications   Medication Sig Start Date End Date Taking? Authorizing Provider  folic acid (FOLVITE) 1 MG tablet Take 1 tablet (1 mg total) by mouth daily. 04/08/21  Yes Emokpae, Courage, MD  lactulose (CHRONULAC) 10 GM/15ML solution Take 15 mLs (10 g total) by mouth daily. 04/08/21  Yes Emokpae, Courage, MD  Multiple Vitamins-Minerals (MULTIVITAMIN WITH MINERALS) tablet Take 1 tablet by mouth daily. 04/07/21 04/07/22 Yes Emokpae, Courage, MD  ondansetron (ZOFRAN) 4 MG tablet Take 1 tablet (4 mg total) by mouth every 6 (six) hours as needed for nausea. 04/07/21  Yes Emokpae, Courage, MD  potassium chloride SA (KLOR-CON M) 20 MEQ tablet Take 1 tablet (20 mEq total) by mouth daily. 04/10/21  Yes Rehman, Mechele Dawley, MD  predniSONE (DELTASONE) 20 MG tablet Take 2 tablets (40 mg total) by mouth daily with breakfast. 04/14/21  Yes Pickard, Cammie Mcgee, MD  rifaximin (XIFAXAN) 550 MG TABS tablet Take 1 tablet (550 mg total) by mouth 2 (two) times daily. 04/04/21  Yes Susy Frizzle, MD  thiamine 100 MG tablet Take 1 tablet (100 mg total) by mouth daily. 04/17/21  Yes Esterwood, Amy S, PA-C  traMADol (ULTRAM) 50 MG tablet Take 1 tablet (50 mg total) by mouth every 6 (six) hours as needed. Patient taking differently: Take  50 mg by mouth every 6 (six) hours as needed for severe pain or moderate pain. 04/21/21 05/21/21 Yes Susy Frizzle, MD    Current Facility-Administered Medications  Medication Dose Route Frequency Provider Last Rate Last Admin   albumin human 25 % solution 25 g  25 g Intravenous Q8H Rexene Agent, MD   Stopped at 03/47/42 5956   folic acid (FOLVITE) tablet 1 mg  1 mg Oral Daily Jennette Kettle M, DO       heparin injection 5,000 Units  5,000 Units Subcutaneous Q8H Jennette Kettle M, DO   5,000 Units at 05/01/21 2327   lactulose (CHRONULAC) 10 GM/15ML solution 10 g  10 g Oral Daily Jennette Kettle M, DO        multivitamin with minerals tablet 1 tablet  1 tablet Oral Daily Jennette Kettle M, DO       ondansetron [facility]) tablet 4 mg  4 mg Oral Q6H PRN Etta Quill, DO       Or   ondansetron [facility]) injection 4 mg  4 mg Intravenous Q6H PRN Etta Quill, DO       predniSONE (DELTASONE) tablet 40 mg  40 mg Oral Q breakfast Jennette Kettle M, DO       rifaximin Doreene Nest) tablet 550 mg  550 mg Oral BID Etta Quill, DO   550 mg at 05/01/21 2327   thiamine tablet 100 mg  100 mg Oral Daily Jennette Kettle M, DO       Or   thiamine (B-1) injection 100 mg  100 mg Intravenous Daily Alcario Drought, Jared M, DO       traMADol Veatrice Bourbon) tablet 50 mg  50 mg Oral Q6H PRN Etta Quill, DO       Current Outpatient Medications  Medication Sig Dispense Refill   folic acid (FOLVITE) 1 MG tablet Take 1 tablet (1 mg total) by mouth daily. 30 tablet 3   lactulose (CHRONULAC) 10 GM/15ML solution Take 15 mLs (10 g total) by mouth daily. 473 mL 2   Multiple Vitamins-Minerals (MULTIVITAMIN WITH MINERALS) tablet Take 1 tablet by mouth daily. 120 tablet 2   ondansetron (ZOFRAN) 4 MG tablet Take 1 tablet (4 mg total) by mouth every 6 (six) hours as needed for nausea. 20 tablet 0   potassium chloride SA (KLOR-CON M) 20 MEQ tablet Take 1 tablet (20 mEq total) by mouth daily. 30 tablet 0   predniSONE (DELTASONE) 20 MG tablet Take 2 tablets (40 mg total) by mouth daily with breakfast. 14 tablet 0   rifaximin (XIFAXAN) 550 MG TABS tablet Take 1 tablet (550 mg total) by mouth 2 (two) times daily. 90 tablet 2   thiamine 100 MG tablet Take 1 tablet (100 mg total) by mouth daily. 30 tablet 2   traMADol (ULTRAM) 50 MG tablet Take 1 tablet (50 mg total) by mouth every 6 (six) hours as needed. (Patient taking differently: Take 50 mg by mouth every 6 (six) hours as needed for severe pain or moderate pain.) 30 tablet 0    Allergies as of 05/01/2021 - Review Complete 05/01/2021  Allergen Reaction Noted   Oxycodone Itching  04/21/2021    Review of Systems:    Constitutional: No weight loss, fever, chills, weakness or fatigue HEENT: Eyes: No change in vision               Ears, Nose, Throat:  No change in hearing or congestion Skin: No rash or itching Cardiovascular: No chest pain,  chest pressure or palpitations   Respiratory: No SOB or cough Gastrointestinal: See HPI and otherwise negative Genitourinary: No dysuria or change in urinary frequency Neurological: No headache, dizziness or syncope Musculoskeletal: No new muscle or joint pain Hematologic: No bleeding or bruising Psychiatric: No history of depression or anxiety     Physical Exam:  Vital signs in last 24 hours: Temp:  [97.4 F (36.3 C)-97.5 F (36.4 C)] 97.4 F (36.3 C) (01/24 0748) Pulse Rate:  [88-98] 91 (01/24 0748) Resp:  [12-18] 12 (01/24 0748) BP: (99-135)/(52-81) 99/52 (01/24 0748) SpO2:  [99 %-100 %] 100 % (01/24 0748)    General:   Alert, well developed male in no acute distress Head:  Normocephalic and atraumatic. Eyes: scleral icterus,conjunctive icteric  Heart:  regular rate and rhythm, no murmurs or gallops Pulm: Clear anteriorly; no wheezing Abdomen:  Soft, Obese AB, skin exam normal, Normal bowel sounds. mild tenderness in the entire abdomen.  Worse LLQ, Without guarding, and Without rebound, no  fluid wave,  mild dullness in flanks   Extremities:  Without edema. Msk:  Symmetrical without gross deformities. Peripheral pulses intact.  Neurologic:  Alert and  oriented x4;  grossly normal neurologically. without asterixis or clonus.  Skin:  with jaundice. no palmar erythema or spider angioma.   Psychiatric: Demonstrates good judgement and reason without abnormal affect or behaviors.   LAB RESULTS: Recent Labs    05/01/21 1450 05/02/21 0358  WBC 17.4* 16.1*  HGB 13.8 12.0*  HCT 37.1* 33.6*  PLT 218 185   BMET Recent Labs    05/01/21 1450 05/02/21 0358  NA 129* 127*  K 3.2* 3.5  CL 103 104  CO2 15* 12*   GLUCOSE 172* 87  BUN 56* 56*  CREATININE 3.13* 3.40*  CALCIUM 8.3* 8.1*   LFT Recent Labs    05/02/21 0358  PROT 5.4*  ALBUMIN 2.2*  AST 92*  ALT 44  ALKPHOS 225*  BILITOT 26.6*   PT/INR Recent Labs    05/01/21 1608  LABPROT 16.7*  INR 1.4*    STUDIES: No results found.   Vladimir Crofts  05/02/2021, 8:20 AM

## 2021-05-02 NOTE — Progress Notes (Signed)
Brief Palliative Medicine Progress Note:  PMT consult received and chart reviewed. GOC completed with patient and wife/Christopher Carrillo - full note to follow:  SUMMARY OF RECOMMENDATIONS   Continue current medical interventions. Patient/wife are considering discharge with hospice but request time to process information from today before making final decisions If wife is not present during rounds, she would appreciate phone calls with updates as patient is at times confused and not relaying all information to her Now DNR/DNI - durable DNR form completed. Will place in shadow chart. Copy was made and will be scanned into [facility]/ACP tab PMT will continue to follow and support holistically  Thank you for allowing PMT to assist in the care of this patient.  Christopher Carrillo M. Tamala Julian [facility] Palliative Medicine Team Team Phone: [phone] NO CHARGE

## 2021-05-02 NOTE — ED Notes (Signed)
Breakfast orders placed 

## 2021-05-02 NOTE — ED Notes (Signed)
Pt ambulated to restroom with steady gait.

## 2021-05-02 NOTE — Progress Notes (Signed)
Subjective:  Feels about the same -  says is making a normal amount of urine but none is recorded- crt initally improved from OP value but is trending worse from yesterday to today -  I looked at GI note-  MELD score 36 and Discriminate score 52.8  Objective Vital signs in last 24 hours: Vitals:   05/02/21 0215 05/02/21 0748 05/02/21 1159 05/02/21 1200  BP: (!) 109/55 (!) 99/52 132/72 123/72  Pulse: 91 91 95 76  Resp: 15 12 14 14   Temp:  (!) 97.4 F (36.3 C) (!) 97.5 F (36.4 C)   TempSrc:  Oral Oral   SpO2: 99% 100% 100% 100%   Weight change:   Intake/Output Summary (Last 24 hours) at 05/02/2021 1341 Last data filed at 05/02/2021 1012 Gross per 24 hour  Intake 100 ml  Output --  Net 100 ml    Assessment/ Plan: Pt is a 51 y.o. yo male with ETOH cirrhosis who was admitted on 05/01/2021 with AKI  Assessment/Plan: 1. Renal-  crt 1.04 on 04/21/21 and now above 3-  was 3.7 on 1/20, improved to 3.13 and now 3.4-  non oliguric-  urine bland-  seems to be consistent with HRS.  Has been started on scheduled albumin, octreotide-  I will go ahead and do some midodrine as well since there have been a couple of soft BPs.  He is acidotic so will start bicarb-  the  hyponatremia is not unusual in this scenario but no overwhelming indications to start HD and HD in these cases is tough-  it is not usually recommended because the prognosis is so poor -  we will hope for some stability and possibly improvement 2.cirrhosis-  GI on board-  lactulose and being treated as well for SBP-  considering diagnostic paracentesis 3. Anemia-  not an issue at this time  4. HTN/volume- appears to have ascites-  has little peripheral edema-  giving albumin -  no lasix  Louis Meckel    Labs: Basic Metabolic Panel: Recent Labs  Lab 04/28/21 1147 05/01/21 1450 05/02/21 0358  NA 129* 129* 127*  K 3.5 3.2* 3.5  CL 100 103 104  CO2 16* 15* 12*  GLUCOSE 115* 172* 87  BUN 47* 56* 56*  CREATININE 3.76*  3.13* 3.40*  CALCIUM 8.6 8.3* 8.1*   Liver Function Tests: Recent Labs  Lab 04/28/21 1147 05/01/21 1450 05/02/21 0358  AST 114* 105* 92*  ALT 47* 52* 44  ALKPHOS  --  282* 225*  BILITOT 37.1* 29.7* 26.6*  PROT 5.3* 6.2* 5.4*  ALBUMIN  --  2.0* 2.2*   Recent Labs  Lab 05/01/21 1450  LIPASE 134*   No results for input(s): AMMONIA in the last 168 hours. CBC: Recent Labs  Lab 04/28/21 1147 05/01/21 1450 05/02/21 0358  WBC 16.7* 17.4* 16.1*  NEUTROABS 14,112*  --   --   HGB 14.0 13.8 12.0*  HCT 35.6* 37.1* 33.6*  MCV 89.2 95.6 96.0  PLT 223 218 185   Cardiac Enzymes: No results for input(s): CKTOTAL, CKMB, CKMBINDEX, TROPONINI in the last 168 hours. CBG: No results for input(s): GLUCAP in the last 168 hours.  Iron Studies: No results for input(s): IRON, TIBC, TRANSFERRIN, FERRITIN in the last 72 hours. Studies/Results: No results found. Medications: Infusions:  albumin human Stopped (05/02/21 1012)   octreotide  (SANDOSTATIN)    IV infusion      Scheduled Medications:  folic acid  1 mg Oral Daily   heparin  5,000 Units Subcutaneous Q8H   lactulose  10 g Oral Daily   multivitamin with minerals  1 tablet Oral Daily   octreotide  50 mcg Intravenous Once   pantoprazole (PROTONIX) IV  40 mg Intravenous Q12H   predniSONE  40 mg Oral Q breakfast   rifaximin  550 mg Oral BID   thiamine  100 mg Oral Daily   Or   thiamine  100 mg Intravenous Daily    have reviewed scheduled and prn medications.  Physical Exam: General: soft spoken-  no complaints-  jaundiced Heart: RRR Lungs: mostly clear Abdomen: distended-  fluid wave Extremities: trace peripheral edema     05/02/2021,1:41 PM  LOS: 1 day

## 2021-05-02 NOTE — Consult Note (Addendum)
Consultation Note Date: 05/02/2021   Patient Name: Christopher Carrillo  DOB: May 05, 1970  MRN: [id number]  Age / Sex: 51 y.o., male  PCP: Christopher Frizzle, MD Referring Physician: Barb Merino, MD  Reason for Consultation: Establishing goals of care, "may be eligible for hospice care"  HPI/Patient Profile: 51 y.o. male  with past medical history of alcoholic cirrhosis, end-stage liver disease and recent alcoholic hepatitis presented to the ED on 05/01/20 from home - he has regular blood work completed with GI and due to abnormal lab values, worsening creatinine, he was sent to the ED. Patient was admitted on 05/01/2021 with AKI, ETOH abuse, hepatorenal syndrome, alcoholic cirrhosis. Nephrology and GI were consulted and both feel due to his disease progression, not responding to steroids, and ongoing ETOH abuse, his prognosis is likely poor. Noted his MELD-Na score is 36.  Patient underwent paracentesis 1/24 with 548m removed - avoiding large volume paracentesis due to AKI.   Clinical Assessment and Goals of Care: I have reviewed medical records including EPIC notes, labs, and imaging. Received report from primary RN - no acute concerns.   Went to visit patient at bedside - wife/Christopher Carrillo present. Patient was lying in bed awake, alert, oriented to name and situation only. He is able to participate in conversation; however, intermittently falls asleep during visit today. No signs or non-verbal gestures of pain or discomfort noted. No respiratory distress, increased work of breathing, or secretions noted. Patient endorses pain in abdomen.  Met with patient and his wife  to discuss diagnosis, prognosis, GOC, EOL wishes, disposition, and options.  I introduced Palliative Medicine as specialized medical care for people living with serious illness. It focuses on providing relief from the symptoms and stress of a serious  illness. The goal is to improve quality of life for both the patient and the family.  We discussed a brief life review of the patient as well as functional and nutritional status. 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 He and his wife have been married 8 years - the patient has 1 son from a prior marriage. Prior to hospitalization, patient was living in a private residence with his wife. He was functionally independent; however, wife states for the last week, patient has been unsteady on his feet with dizziness, increased confusion, and more sleepy. Patient reports having a poor appetite at home - denies N/V. Noted albumin to be 2.2 on 05/02/21.   We discussed patient's current illness and what it means in the larger context of patient's on-going co-morbidities. Education provided that cirrhosis is a non-curable disease and unfortunately, he is not a transplant candidate. Education provided that patient has likely reached end stages. Natural disease trajectory and expectations at EOL were discussed. I attempted to elicit values and goals of care important to the patient. The difference between aggressive medical intervention and comfort care was considered in light of the patient's goals of care. Reviewed that due to the disease progression and no longer responding to medical management,  his prognosis is likely poor. Patient's wife was understandably tearful.  Provided education and counseling at length on the philosophy and benefits of hospice care. Discussed that it offers a holistic approach to care in the setting of end-stage illness, and is about supporting the patient where they are allowing nature to take it's course. Discussed the hospice team includes RNs, physicians, social workers, and chaplains. They can provide personal care, support for the family, and help keep patient out of the hospital as well as assist with DME needs for home hospice. Education provided on the  difference between home vs residential hospice.   Advance directives, concepts specific to code status, and rehospitalization were considered and discussed. Patient does not have a Living Will or HCPOA - reviewed his legal decision maker without this document would be his wife - he confirms he would want her to be his decision maker if needed.   Encouraged patient/family to consider DNR/DNI status understanding evidenced based poor outcomes in similar hospitalized patient, as the cause of arrest is likely associated with advanced chronic/terminal illness rather than an easily reversible acute cardio-pulmonary event. I explained that DNR/DNI does not change the medical plan and it only comes into effect after a person has arrested (died).  It is a protective measure to keep [place] from harming the patient in their last moments of life. Patient/Family were agreeable to DNR/DNI with understanding that he would not receive CPR, defibrillation, ACLS medications, or intubation.   Patient and wife would like time to process information given to them today before making final decisions. Patient is ok with continuing current medical treatment.  Visit also consisted of discussions dealing with the complex and emotionally intense issues of symptom management and palliative care in the setting of serious and potentially life-threatening illness. Palliative care team will continue to support patient, patient's family, and medical team.  Discussed with patient/family the importance of continued conversation with each other and the medical providers regarding overall plan of care and treatment options, ensuring decisions are within the context of the patients values and GOCs.    Questions and concerns were addressed. The patient/family was encouraged to call with questions and/or concerns. PMT card was provided.    Primary Decision Maker: PATIENT with assistance from his wife as he is intermittently confused     SUMMARY OF RECOMMENDATIONS   Continue current medical interventions. Patient/wife are considering discharge with hospice but request time to process information from today before making final decisions If wife is not present during rounds, she would appreciate phone calls with updates as patient is at times confused and not relaying all information to her Now DNR/DNI - durable DNR form completed. Will place in shadow chart. Copy was made and will be scanned into Vynca/ACP tab PMT will continue to follow and support holistically   Code Status/Advance Care Planning: DNR   Palliative Prophylaxis:  Aspiration, Bowel Regimen, Delirium Protocol, Frequent Pain Assessment, Oral Care, and Turn Reposition  Additional Recommendations (Limitations, Scope, Preferences): Full Scope Treatment and No Tracheostomy  Psycho-social/Spiritual:  Desire for further Chaplaincy support:no Created space and opportunity for patient and family to express thoughts and feelings regarding patient's current medical situation.  Emotional support and therapeutic listening provided.  Prognosis:  < 6 months  Discharge Planning: To Be Determined      Primary Diagnoses: Present on Admission:  Alcoholic cirrhosis ([place])  Acute kidney failure ([place])  Hepatorenal syndrome ([place])  Alcoholic hepatitis  ETOH abuse   I have reviewed the  medical record, interviewed the patient and family, and examined the patient. The following aspects are pertinent.  Past Medical History:  Diagnosis Date   Abdominal pain    Alcoholic hepatitis without ascites    Anemia    Anxiety    Arthritis    Elevated bilirubin    Essential hypertension    Psoriasis    Transaminitis    Social History   Socioeconomic History   Marital status: Married    Spouse name: Not on file   Number of children: Not on file   Years of education: Not on file   Highest education level: Not on file  Occupational History   Not on file  Tobacco  Use   Smoking status: Every Day    Packs/day: 0.33    Types: Cigarettes   Smokeless tobacco: Never  Vaping Use   Vaping Use: Never used  Substance and Sexual Activity   Alcohol use: Yes    Alcohol/week: 1.0 standard drink    Types: 1 Cans of beer per week    Comment: occasionally   Drug use: No   Sexual activity: Yes  Other Topics Concern   Not on file  Social History Narrative   Not on file   Social Determinants of Health   Financial Resource Strain: Not on file  Food Insecurity: Not on file  Transportation Needs: Not on file  Physical Activity: Not on file  Stress: Not on file  Social Connections: Not on file   Family History  Problem Relation Age of Onset   Asthma Father    Cancer Sister 26       Pharyngeal   Colon cancer Neg Hx    Liver disease Neg Hx    Esophageal cancer Neg Hx    Rectal cancer Neg Hx    Stomach cancer Neg Hx    Scheduled Meds:  folic acid  1 mg Oral Daily   heparin  5,000 Units Subcutaneous Q8H   lactulose  10 g Oral Daily   lidocaine       midodrine  10 mg Oral BID WC   multivitamin with minerals  1 tablet Oral Daily   octreotide  50 mcg Intravenous Once   pantoprazole (PROTONIX) IV  40 mg Intravenous Q12H   predniSONE  40 mg Oral Q breakfast   rifaximin  550 mg Oral BID   sodium bicarbonate  1,300 mg Oral BID   thiamine  100 mg Oral Daily   Or   thiamine  100 mg Intravenous Daily   Continuous Infusions:  albumin human 25 g (05/02/21 1603)   octreotide  (SANDOSTATIN)    IV infusion     PRN Meds:.HYDROmorphone (DILAUDID) injection, lidocaine (PF), ondansetron **OR** ondansetron (ZOFRAN) IV, traMADol Medications Prior to Admission:  Prior to Admission medications   Medication Sig Start Date End Date Taking? Authorizing Provider  folic acid (FOLVITE) 1 MG tablet Take 1 tablet (1 mg total) by mouth daily. 04/08/21  Yes Emokpae, Courage, MD  lactulose (CHRONULAC) 10 GM/15ML solution Take 15 mLs (10 g total) by mouth daily. 04/08/21   Yes Emokpae, Courage, MD  Multiple Vitamins-Minerals (MULTIVITAMIN WITH MINERALS) tablet Take 1 tablet by mouth daily. 04/07/21 04/07/22 Yes Emokpae, Courage, MD  ondansetron (ZOFRAN) 4 MG tablet Take 1 tablet (4 mg total) by mouth every 6 (six) hours as needed for nausea. 04/07/21  Yes Emokpae, Courage, MD  potassium chloride SA (KLOR-CON M) 20 MEQ tablet Take 1 tablet (20 mEq total) by mouth daily.  04/10/21  Yes Rehman, Mechele Dawley, MD  predniSONE (DELTASONE) 20 MG tablet Take 2 tablets (40 mg total) by mouth daily with breakfast. 04/14/21  Yes Pickard, Cammie Mcgee, MD  rifaximin (XIFAXAN) 550 MG TABS tablet Take 1 tablet (550 mg total) by mouth 2 (two) times daily. 04/04/21  Yes Christopher Frizzle, MD  thiamine 100 MG tablet Take 1 tablet (100 mg total) by mouth daily. 04/17/21  Yes Esterwood, Amy S, PA-C  traMADol (ULTRAM) 50 MG tablet Take 1 tablet (50 mg total) by mouth every 6 (six) hours as needed. Patient taking differently: Take 50 mg by mouth every 6 (six) hours as needed for severe pain or moderate pain. 04/21/21 05/21/21 Yes Christopher Frizzle, MD   Allergies  Allergen Reactions   Oxycodone Itching   Review of Systems  Constitutional:  Positive for activity change and appetite change.  Respiratory:  Negative for shortness of breath.   Gastrointestinal:  Negative for nausea and vomiting.  Neurological:  Positive for weakness.  All other systems reviewed and are negative.  Physical Exam Vitals and nursing note reviewed.  Constitutional:      General: He is not in acute distress. Pulmonary:     Effort: No respiratory distress.  Skin:    General: Skin is warm and dry.     Coloration: Skin is jaundiced.  Neurological:     Mental Status: He is alert. He is disoriented and confused.     Motor: Weakness present.  Psychiatric:        Behavior: Behavior is cooperative.        Cognition and Memory: Cognition is impaired. Memory is impaired.    Vital Signs: BP 129/77    Pulse 95    Temp (!)  97.5 F (36.4 C) (Oral)    Resp 12    SpO2 100%  Pain Scale: 0-10   Pain Score: Asleep   SpO2: SpO2: 100 % O2 Device:SpO2: 100 % O2 Flow Rate: .   IO: Intake/output summary:  Intake/Output Summary (Last 24 hours) at 05/02/2021 1730 Last data filed at 05/02/2021 1012 Gross per 24 hour  Intake 100 ml  Output --  Net 100 ml    LBM:   Baseline Weight:   Most recent weight:       Palliative Assessment/Data: PPS 40%     Time In: 1630 Time Out: 1745 Time Total: 75 minutes  Greater than 50%  of this time was spent counseling and coordinating care related to the above assessment and plan.  Signed by: Lin Landsman, NP   Please contact Palliative Medicine Team phone at [phone] for questions and concerns.  For individual provider: See Shea Evans

## 2021-05-02 NOTE — ED Notes (Signed)
Pt ambulated to the restroom with a steady gait.

## 2021-05-02 NOTE — Progress Notes (Signed)
PROGRESS NOTE    Christopher Carrillo  YKZ:[id number] DOB: Mar 04, 1971 DOA: 05/01/2021 PCP: Susy Frizzle, MD    Brief Narrative:  51 year old with history of alcoholic cirrhosis, end-stage liver disease and recent alcoholic hepatitis who was following up with GI as outpatient, recent worsening creatinine so sent to ER. Patient reports diffuse abdominal pain, generalized weakness and intermittent dizziness.   Assessment & Plan:   Acute kidney injury, present on admission likely due to hepatorenal syndrome: Followed by nephrology.  Currently treated with albumin and fluids.  Work-up in progress.  Poor prognosis.  Alcoholic cirrhosis with alcoholic hepatitis, recent encephalopathy and ascites: Continue rifaximin and lactulose.  Patient is on prednisone, will continue prednisone taper. Will consult palliative care, likely end-stage and may benefit with hospice. Symptomatic treatment, will treat with Dilaudid for pain, given renal failure.  Hyponatremia: Due to liver cirrhosis.  Monitor.   DVT prophylaxis: heparin injection 5,000 Units Start: 05/01/21 2230   Code Status: Full code, palliative consulted Family Communication: None Disposition Plan: Status is: Inpatient  Remains inpatient appropriate because: Severe disease         Consultants:  Nephrology Palliative  Procedures:  None  Antimicrobials:  Rifaximin, longstanding   Subjective: Patient seen and examined in the emergency room.  He was trying to eat breakfast.  Denies any nausea vomiting.  Wants something for the belly pain.  Feels distended and diffuse abdominal pain.  Had 1 bloody bowel movement early morning today.  Objective: Vitals:   05/01/21 2000 05/01/21 2130 05/02/21 0215 05/02/21 0748  BP: 135/68 117/69 (!) 109/55 (!) 99/52  Pulse: 92 90 91 91  Resp: 14 14 15 12   Temp:    (!) 97.4 F (36.3 C)  TempSrc:    Oral  SpO2: 100% 100% 99% 100%   No intake or output data in the 24 hours ending  05/02/21 0823 There were no vitals filed for this visit.  Examination:  General exam: Appears calm and comfortable  Frail and debilitated.  Chronically sick looking. Icteric.  On room air.  Sitting at the edge of the bed. Respiratory system: Clear to auscultation. Respiratory effort normal.  No added sounds. Cardiovascular system: S1 & S2 heard, RRR.  Gastrointestinal system: Soft.  Diffusely distended.  No localized tenderness.  Bowel sound present. Central nervous system: Alert and oriented. No focal neurological deficits. Extremities: Symmetric 5 x 5 power.    Data Reviewed: I have personally reviewed following labs and imaging studies  CBC: Recent Labs  Lab 04/28/21 1147 05/01/21 1450 05/02/21 0358  WBC 16.7* 17.4* 16.1*  NEUTROABS 14,112*  --   --   HGB 14.0 13.8 12.0*  HCT 35.6* 37.1* 33.6*  MCV 89.2 95.6 96.0  PLT 223 218 939   Basic Metabolic Panel: Recent Labs  Lab 04/28/21 1147 05/01/21 1450 05/02/21 0358  NA 129* 129* 127*  K 3.5 3.2* 3.5  CL 100 103 104  CO2 16* 15* 12*  GLUCOSE 115* 172* 87  BUN 47* 56* 56*  CREATININE 3.76* 3.13* 3.40*  CALCIUM 8.6 8.3* 8.1*   GFR: CrCl cannot be calculated (Unknown ideal weight.). Liver Function Tests: Recent Labs  Lab 04/28/21 1147 05/01/21 1450 05/02/21 0358  AST 114* 105* 92*  ALT 47* 52* 44  ALKPHOS  --  282* 225*  BILITOT 37.1* 29.7* 26.6*  PROT 5.3* 6.2* 5.4*  ALBUMIN  --  2.0* 2.2*   Recent Labs  Lab 05/01/21 1450  LIPASE 134*   No results for input(s): AMMONIA in  the last 168 hours. Coagulation Profile: Recent Labs  Lab 04/28/21 1147 05/01/21 1608  INR 1.4* 1.4*   Cardiac Enzymes: No results for input(s): CKTOTAL, CKMB, CKMBINDEX, TROPONINI in the last 168 hours. BNP (last 3 results) No results for input(s): PROBNP in the last 8760 hours. HbA1C: No results for input(s): HGBA1C in the last 72 hours. CBG: No results for input(s): GLUCAP in the last 168 hours. Lipid Profile: No  results for input(s): CHOL, HDL, LDLCALC, TRIG, CHOLHDL, LDLDIRECT in the last 72 hours. Thyroid Function Tests: No results for input(s): TSH, T4TOTAL, FREET4, T3FREE, THYROIDAB in the last 72 hours. Anemia Panel: No results for input(s): VITAMINB12, FOLATE, FERRITIN, TIBC, IRON, RETICCTPCT in the last 72 hours. Sepsis Labs: No results for input(s): PROCALCITON, LATICACIDVEN in the last 168 hours.  No results found for this or any previous visit (from the past 240 hour(s)).       Radiology Studies: No results found.      Scheduled Meds:  folic acid  1 mg Oral Daily   heparin  5,000 Units Subcutaneous Q8H   lactulose  10 g Oral Daily   multivitamin with minerals  1 tablet Oral Daily   predniSONE  40 mg Oral Q breakfast   rifaximin  550 mg Oral BID   thiamine  100 mg Oral Daily   Or   thiamine  100 mg Intravenous Daily   Continuous Infusions:  albumin human Stopped (05/02/21 0140)     LOS: 1 day    Time spent: 35 minutes    Barb Merino, MD [facility] Pager [phone]

## 2021-05-02 NOTE — ED Notes (Signed)
Pt sitting at the bedside with spouse eating dinner.

## 2021-05-02 NOTE — ED Notes (Signed)
Patient transported to IR 

## 2021-05-03 DIAGNOSIS — K7010 Alcoholic hepatitis without ascites: Secondary | ICD-10-CM | POA: Diagnosis not present

## 2021-05-03 DIAGNOSIS — K7031 Alcoholic cirrhosis of liver with ascites: Secondary | ICD-10-CM | POA: Diagnosis not present

## 2021-05-03 DIAGNOSIS — R188 Other ascites: Secondary | ICD-10-CM

## 2021-05-03 DIAGNOSIS — E440 Moderate protein-calorie malnutrition: Secondary | ICD-10-CM

## 2021-05-03 DIAGNOSIS — F1010 Alcohol abuse, uncomplicated: Secondary | ICD-10-CM | POA: Diagnosis not present

## 2021-05-03 DIAGNOSIS — N179 Acute kidney failure, unspecified: Secondary | ICD-10-CM | POA: Diagnosis not present

## 2021-05-03 LAB — COMPREHENSIVE METABOLIC PANEL
ALT: 45 U/L — ABNORMAL HIGH (ref 0–44)
AST: 79 U/L — ABNORMAL HIGH (ref 15–41)
Albumin: 2.9 g/dL — ABNORMAL LOW (ref 3.5–5.0)
Alkaline Phosphatase: 192 U/L — ABNORMAL HIGH (ref 38–126)
Anion gap: 10 (ref 5–15)
BUN: 52 mg/dL — ABNORMAL HIGH (ref 6–20)
CO2: 15 mmol/L — ABNORMAL LOW (ref 22–32)
Calcium: 8.4 mg/dL — ABNORMAL LOW (ref 8.9–10.3)
Chloride: 100 mmol/L (ref 98–111)
Creatinine, Ser: 2.84 mg/dL — ABNORMAL HIGH (ref 0.61–1.24)
GFR, Estimated: 26 mL/min — ABNORMAL LOW (ref >60)
Glucose, Bld: 119 mg/dL — ABNORMAL HIGH (ref 70–99)
Potassium: 3.4 mmol/L — ABNORMAL LOW (ref 3.5–5.1)
Sodium: 125 mmol/L — ABNORMAL LOW (ref 135–145)
Total Bilirubin: 24.8 mg/dL (ref 0.3–1.2)
Total Protein: 6 g/dL — ABNORMAL LOW (ref 6.5–8.1)

## 2021-05-03 LAB — CBC WITH DIFFERENTIAL/PLATELET
Abs Immature Granulocytes: 0.13 K/uL — ABNORMAL HIGH (ref 0.00–0.07)
Basophils Absolute: 0 K/uL (ref 0.0–0.1)
Basophils Relative: 0 %
Eosinophils Absolute: 0 K/uL (ref 0.0–0.5)
Eosinophils Relative: 0 %
HCT: 28.9 % — ABNORMAL LOW (ref 39.0–52.0)
Hemoglobin: 10.8 g/dL — ABNORMAL LOW (ref 13.0–17.0)
Immature Granulocytes: 1 %
Lymphocytes Relative: 7 %
Lymphs Abs: 0.7 K/uL (ref 0.7–4.0)
MCH: 35.8 pg — ABNORMAL HIGH (ref 26.0–34.0)
MCHC: 37.4 g/dL — ABNORMAL HIGH (ref 30.0–36.0)
MCV: 95.7 fL (ref 80.0–100.0)
Monocytes Absolute: 0.8 K/uL (ref 0.1–1.0)
Monocytes Relative: 9 %
Neutro Abs: 7.5 K/uL (ref 1.7–7.7)
Neutrophils Relative %: 83 %
Platelets: 152 K/uL (ref 150–400)
RBC: 3.02 MIL/uL — ABNORMAL LOW (ref 4.22–5.81)
RDW: 15.8 % — ABNORMAL HIGH (ref 11.5–15.5)
WBC: 9.1 K/uL (ref 4.0–10.5)
nRBC: 0 % (ref 0.0–0.2)

## 2021-05-03 LAB — PATHOLOGIST SMEAR REVIEW

## 2021-05-03 MED ORDER — CIPROFLOXACIN IN D5W 400 MG/200ML IV SOLN
400.0000 mg | Freq: Two times a day (BID) | INTRAVENOUS | Status: DC
  Administered 2021-05-03 – 2021-05-04 (×2): 400 mg via INTRAVENOUS
  Filled 2021-05-03 (×3): qty 200

## 2021-05-03 MED ORDER — ENSURE ENLIVE PO LIQD
237.0000 mL | Freq: Three times a day (TID) | ORAL | Status: DC
  Administered 2021-05-03 – 2021-05-05 (×5): 237 mL via ORAL

## 2021-05-03 MED ORDER — POTASSIUM CHLORIDE CRYS ER 20 MEQ PO TBCR
40.0000 meq | EXTENDED_RELEASE_TABLET | Freq: Once | ORAL | Status: AC
  Administered 2021-05-03: 09:00:00 40 meq via ORAL
  Filled 2021-05-03: qty 2

## 2021-05-03 MED ORDER — METRONIDAZOLE 500 MG/100ML IV SOLN
500.0000 mg | Freq: Two times a day (BID) | INTRAVENOUS | Status: DC
  Administered 2021-05-03 – 2021-05-04 (×2): 500 mg via INTRAVENOUS
  Filled 2021-05-03 (×2): qty 100

## 2021-05-03 NOTE — Progress Notes (Signed)
PROGRESS NOTE    Christopher Carrillo  WSF:[id number] DOB: 07-31-1970 DOA: 05/01/2021 PCP: Susy Frizzle, MD    Chief Complaint  Patient presents with   Abnormal Lab    Brief Narrative:  51 year old with history of alcoholic cirrhosis, end-stage liver disease and recent alcoholic hepatitis who was following up with GI as outpatient, recent worsening creatinine so sent to ER. Patient reports diffuse abdominal pain, generalized weakness and intermittent dizziness. Pt underwent paracentesis yesterday and 500 ml removed.    Assessment & Plan:   Principal Problem:   Acute kidney failure (HCC) Active Problems:   Alcoholic hepatitis   Alcoholic cirrhosis (HCC)   Hepatorenal syndrome (HCC)   ETOH abuse   Malnutrition of moderate degree  Decompensated cirrhosis secondary to continuous EtOH use Hepatic encephalopathy with ascites Meld score is 36 S/p diagnostic paracentesis yesterday. Improving bilirubin to 24 No role for liver transplantation given ongoing alcohol use   Mild generalized abdominal pain ?  CT of the abdomen pelvis without contrast. Patient started on IV ciprofloxacin for prophylaxis   Esophageal varices Continue with Protonix twice daily and octreotide.   AKI secondary to hepatorenal syndrome Avoid large volume paracentesis, diuretics on hold. Continue with octreotide and midodrine. Nephrology on board and appreciate recommendations.   History of hepatic encephalopathy Continue with lactulose and rifaximin.  To have coagulopathy Continue with PT/INR   Hypokalemia Replaced  DVT prophylaxis: heparin Code Status: DNR Family Communication: NONE AT BEDSIDE.  Disposition:   Status is: Inpatient  Remains inpatient appropriate because: hyponatremia.     Consultants:  GI PALLIATIVE CARE NEPHROLOGY.  Procedures: none.   Antimicrobials: none.    Subjective: No new complaints.   Objective: Vitals:   05/03/21 0204 05/03/21 0451 05/03/21  0856 05/03/21 1449  BP: (!) 150/71 127/79    Pulse: 90 84 92 91  Resp: 16 17 17 17   Temp: 97.7 F (36.5 C) 97.8 F (36.6 C) 97.8 F (36.6 C) (!) 97.5 F (36.4 C)  TempSrc: Oral Oral Oral Oral  SpO2: 100% 100% 100%   Weight: 84.2 kg     Height: 5' 8"  (1.727 m)       Intake/Output Summary (Last 24 hours) at 05/03/2021 1517 Last data filed at 05/03/2021 0300 Gross per 24 hour  Intake 116.73 ml  Output --  Net 116.73 ml   Filed Weights   05/03/21 0204  Weight: 84.2 kg    Examination:  General exam: well developed gentleman , jaundiced.  Respiratory system: Clear to auscultation. Respiratory effort normal. Cardiovascular system: S1 & S2 heard, RRR. No JVD,  No pedal edema. Gastrointestinal system: Abdomen is distended. soft and mild generalized abdominal tenderness. normal bowel sounds heard. Central nervous system: Alert and oriented. No focal neurological deficits. Extremities: Symmetric 5 x 5 power. Skin: No rashes, lesions or ulcers Psychiatry: Mood & affect appropriate.     Data Reviewed: I have personally reviewed following labs and imaging studies  CBC: Recent Labs  Lab 04/28/21 1147 05/01/21 1450 05/02/21 0358 05/03/21 0337  WBC 16.7* 17.4* 16.1* 9.1  NEUTROABS 14,112*  --   --  7.5  HGB 14.0 13.8 12.0* 10.8*  HCT 35.6* 37.1* 33.6* 28.9*  MCV 89.2 95.6 96.0 95.7  PLT 223 218 185 017    Basic Metabolic Panel: Recent Labs  Lab 04/28/21 1147 05/01/21 1450 05/02/21 0358 05/03/21 0337  NA 129* 129* 127* 125*  K 3.5 3.2* 3.5 3.4*  CL 100 103 104 100  CO2 16* 15* 12* 15*  GLUCOSE 115* 172* 87 119*  BUN 47* 56* 56* 52*  CREATININE 3.76* 3.13* 3.40* 2.84*  CALCIUM 8.6 8.3* 8.1* 8.4*    GFR: Estimated Creatinine Clearance: 33.2 mL/min (A) (by C-G formula based on SCr of 2.84 mg/dL (H)).  Liver Function Tests: Recent Labs  Lab 04/28/21 1147 05/01/21 1450 05/02/21 0358 05/03/21 0337  AST 114* 105* 92* 79*  ALT 47* 52* 44 45*  ALKPHOS  --   282* 225* 192*  BILITOT 37.1* 29.7* 26.6* 24.8*  PROT 5.3* 6.2* 5.4* 6.0*  ALBUMIN  --  2.0* 2.2* 2.9*    CBG: No results for input(s): GLUCAP in the last 168 hours.   Recent Results (from the past 240 hour(s))  Culture, body fluid w Gram Stain-bottle     Status: None (Preliminary result)   Collection Time: 05/02/21  3:27 PM   Specimen: Fluid  Result Value Ref Range Status   Specimen Description FLUID  Final   Special Requests PERITONEAL  Final   Culture   Final    NO GROWTH < 12 HOURS Performed at [facility], [address], [place], [place] [address]    Report Status PENDING  Incomplete  Gram stain     Status: None   Collection Time: 05/02/21  3:27 PM   Specimen: Fluid  Result Value Ref Range Status   Specimen Description FLUID  Final   Special Requests PERITONEAL  Final   Gram Stain   Final    WBC PRESENT,BOTH PMN AND MONONUCLEAR NO ORGANISMS SEEN CYTOSPIN SMEAR Performed at [facility], [address] [place], [place] [address]    Report Status 05/02/2021 FINAL  Final         Radiology Studies: IR Paracentesis  Result Date: 05/02/2021 INDICATION: Patient with a history of end-stage liver disease presents today with ascites. Interventional radiology asked to perform a diagnostic paracentesis up to 500 mL. EXAM: ULTRASOUND GUIDED PARACENTESIS MEDICATIONS: 1% lidocaine 10 mL COMPLICATIONS: None immediate. PROCEDURE: Informed written consent was obtained from the patient after a discussion of the risks, benefits and alternatives to treatment. A timeout was performed prior to the initiation of the procedure. Initial ultrasound scanning demonstrates a large amount of ascites within the right lower abdominal quadrant. The right lower abdomen was prepped and draped in the usual sterile fashion. 1% lidocaine was used for local anesthesia. Following this, a 19 gauge, 7-cm, Yueh catheter was introduced. An ultrasound image was saved for documentation purposes. The  paracentesis was performed. The catheter was removed and a dressing was applied. The patient tolerated the procedure well without immediate post procedural complication. FINDINGS: A total of approximately 500 mL of orange fluid was removed. Samples were sent to the laboratory as requested by the clinical team. IMPRESSION: Successful ultrasound-guided paracentesis yielding 500 mL liters of peritoneal fluid. Read by: Soyla Dryer, NP Electronically Signed   By: Corrie Mckusick D.O.   On: 05/02/2021 15:40        Scheduled Meds:  feeding supplement  237 mL Oral TID BM   folic acid  1 mg Oral Daily   heparin  5,000 Units Subcutaneous Q8H   lactulose  10 g Oral Daily   midodrine  10 mg Oral BID WC   multivitamin with minerals  1 tablet Oral Daily   pantoprazole (PROTONIX) IV  40 mg Intravenous Q12H   predniSONE  40 mg Oral Q breakfast   rifaximin  550 mg Oral BID   sodium bicarbonate  1,300 mg  Oral BID   thiamine  100 mg Oral Daily   Or   thiamine  100 mg Intravenous Daily   Continuous Infusions:  albumin human 25 g (05/03/21 1441)   ciprofloxacin 400 mg (05/03/21 1509)   metronidazole     octreotide  (SANDOSTATIN)    IV infusion 50 mcg/hr (05/03/21 0650)     LOS: 2 days        Hosie Poisson, MD [facility]   To contact the attending provider between 7A-7P or the covering provider during after hours 7P-7A, please log into the web site [email or web address] and access using universal [place] password for that web site. If you do not have the password, please call the hospital operator.  05/03/2021, 3:17 PM

## 2021-05-03 NOTE — Progress Notes (Signed)
Progress Note   Subjective  Chief Complaint: AKI likely HRS with decompensated cirrhosis  Wife at bedside. Loose bowel movement this morning, patient states was black. Continues to have abdominal pain, just had pain medications which helped. Denies fever, chills, nausea, vomiting. Patient is making urine at this time.    Objective   Vital signs in last 24 hours: Temp:  [97.5 F (36.4 C)-97.8 F (36.6 C)] 97.8 F (36.6 C) (01/25 0856) Pulse Rate:  [76-95] 92 (01/25 0856) Resp:  [10-17] 17 (01/25 0856) BP: (123-150)/(63-81) 127/79 (01/25 0451) SpO2:  [99 %-100 %] 100 % (01/25 0856) Weight:  [84.2 kg] 84.2 kg (01/25 0204) Last BM Date: 05/03/21  General:   Alert, well developed male in no acute distress Eyes: scleral icterus,conjunctive icteric  Heart:  regular rate and rhythm, no murmurs or gallops Pulm: Clear anteriorly; no wheezing Abdomen:  Soft, Obese AB, skin exam normal, Normal bowel sounds. mild tenderness in the entire abdomen.  Worse LLQ, with rebound, no  fluid wave,  mild dullness in flanks   Extremities:  Without edema. Neurologic:  Alert and  oriented x4;  grossly normal neurologically. without asterixis or clonus.  Skin:  with jaundice. no palmar erythema or spider angioma.   Psychiatric: Demonstrates good judgement and reason without abnormal affect or behaviors.  Intake/Output from previous day: 01/24 0701 - 01/25 0700 In: 216.7 [I.V.:62.5; IV Piggyback:154.3] Out: -  Intake/Output this shift: No intake/output data recorded.  Lab Results: Recent Labs    05/01/21 1450 05/02/21 0358 05/03/21 0337  WBC 17.4* 16.1* 9.1  HGB 13.8 12.0* 10.8*  HCT 37.1* 33.6* 28.9*  PLT 218 185 152   BMET Recent Labs    05/01/21 1450 05/02/21 0358 05/03/21 0337  NA 129* 127* 125*  K 3.2* 3.5 3.4*  CL 103 104 100  CO2 15* 12* 15*  GLUCOSE 172* 87 119*  BUN 56* 56* 52*  CREATININE 3.13* 3.40* 2.84*  CALCIUM 8.3* 8.1* 8.4*   LFT Recent Labs     05/03/21 0337  PROT 6.0*  ALBUMIN 2.9*  AST 79*  ALT 45*  ALKPHOS 192*  BILITOT 24.8*   PT/INR Recent Labs    05/01/21 1608 05/02/21 1235  LABPROT 16.7* 18.8*  INR 1.4* 1.6*    Studies/Results: IR Paracentesis  Result Date: 05/02/2021 INDICATION: Patient with a history of end-stage liver disease presents today with ascites. Interventional radiology asked to perform a diagnostic paracentesis up to 500 mL. EXAM: ULTRASOUND GUIDED PARACENTESIS MEDICATIONS: 1% lidocaine 10 mL COMPLICATIONS: None immediate. PROCEDURE: Informed written consent was obtained from the patient after a discussion of the risks, benefits and alternatives to treatment. A timeout was performed prior to the initiation of the procedure. Initial ultrasound scanning demonstrates a large amount of ascites within the right lower abdominal quadrant. The right lower abdomen was prepped and draped in the usual sterile fashion. 1% lidocaine was used for local anesthesia. Following this, a 19 gauge, 7-cm, Yueh catheter was introduced. An ultrasound image was saved for documentation purposes. The paracentesis was performed. The catheter was removed and a dressing was applied. The patient tolerated the procedure well without immediate post procedural complication. FINDINGS: A total of approximately 500 mL of orange fluid was removed. Samples were sent to the laboratory as requested by the clinical team. IMPRESSION: Successful ultrasound-guided paracentesis yielding 500 mL liters of peritoneal fluid. Read by: Soyla Dryer, NP Electronically Signed   By: Corrie Mckusick D.O.   On: 05/02/2021 15:40  Impression/Plan:   Decompensated cirrhosis secondary continuing ETOH use with history of hepatic encephalopathy and ascites WBC 9.1 HGB 10.8 MCV 95.7 Platelets 152 AST 79 ALT 45 Alkphos 192 TBili 24.8 (26.6) INR 1.6 ( 1.4) MELD-Na score: 36  Discriminate Score 52.8, on prednisone 40 mg daily, will calculate Lille score day 7. With  these scores and history, very high mortality rate and very poor prognosis Palliative has been consulted- appreciate their input Patient now DNR/DNI   Ascites S/p diagnostic paracentesis Paracentesis evaluation reviewed  Gram stain neg, albumin less than 1.5 It showed Neutrophil count at % 27 and total nucleated cell count at 47 indicating no SBP.  WBC improved  AB pain No SBP, more LLQ with rebound now, WBC down trending Will discuss with Dr. Tarri Glenn possibility of getting CT AB and pelvis without contrast to evaluate for diverticulitis/colitis versus treating empirically with ABX  Esophageal Varices no evidence last EGD : Last EGD Dec 2022 without evidence of varices, showed portal gastropathy and mild duodenitis. Have melena/hemtochezia x 1 week without significant drop in H/H With recent EGD likely repeating would be low yield Monitor H/H transfuse to keep around 7 Will start on octeotride for HRS/low risk bleeding, make sure on protonix BID for possible doudentitis   Acute renal failure possibly due to hepatorenal syndrome BUN from 1.0 on 01/13 to 3.7 on 1/20 BUN 52 Cr 2.84 - improving -Avoid large volume paracentesis -Diuretics on hold, DC antihypertensives --Avoid nephrotoxic drugs -albumin with HRS dosing, continue octreotide and midodrine added due to soft BPs - appreciate nephrology input   Alcohol abuse -Alcohol Abstinence counseling discussed with patient as continued use is strongly associated with worsening liver disease progression  -Monitor for withdrawal symptoms while inpatient -Empiric treatment with folic acid, multivitamin and thiamine.  --No role for liver transplantation given ongoing alcohol use   Hepatic Encephalopathy:  No evidence this admission continue meds - Lactulose, 30m titrate to 3bms per day - Rifaximin 5565mbid - minimize/remove all benzos and narcotics   With history of coagulopathy present INR 1.6 Check PT/INR daily (Vitamin K/FFP  if elevated for procedure)  Future Appointments  Date Time Provider De[place]2/21/2023  3:00 PM BeThornton ParkMD [facility]      LOS: 2 days   AmVladimir Crofts1/25/2023, 10:24 AM

## 2021-05-03 NOTE — Progress Notes (Signed)
Initial Nutrition Assessment  DOCUMENTATION CODES:   Non-severe (moderate) malnutrition in context of chronic illness  INTERVENTION:   Ensure Enlive po TID, each supplement provides 350 kcal and 20 grams of protein  Continue MVI with minerals daily  NUTRITION DIAGNOSIS:   Moderate Malnutrition related to chronic illness (ETOH cirrhosis) as evidenced by mild muscle depletion, mild fat depletion.  GOAL:   Patient will meet greater than or equal to 90% of their needs  MONITOR:   PO intake, Supplement acceptance, Labs  REASON FOR ASSESSMENT:   Malnutrition Screening Tool    ASSESSMENT:   51 yo male admitted with AKI, likely hepatorenal syndrome, with decompensated cirrhosis. PMH includes ETOH cirrhosis, hepatic encephalopathy, anemia, HTN.  S/P abdominal paracentesis 1/24 with 500 ml output. Patient reports taking thiamine, folic acid, and vitamin B-12 supplements at home. He typically does not take a MVI.  Intake has been very poor for the past 1-2 weeks. Per wife, he has been eating 1 small meal per day for the past week. Prior to that, he was eating 2-3 small meals per day.  Patient reports usual weight ~200 lbs. Currently 185 lbs. Weight history reviewed. 91.3 kg on 04/17/21. Down to 84.2 kg on 05/03/21. Suspect weight loss can be attributed to fluid removal from paracentesis.   Labs reviewed. Na 125, K 3.4  Medications reviewed and include folic acid, lactulose, MVI with minerals, Protonix, prednisone, sodium bicarb, thiamine.  Patient meets criteria for moderate malnutrition given mild depletion of muscle and subcutaneous fat mass.   NUTRITION - FOCUSED PHYSICAL EXAM:  Flowsheet Row Most Recent Value  Orbital Region Mild depletion  Upper Arm Region Mild depletion  Thoracic and Lumbar Region Mild depletion  Buccal Region Mild depletion  Temple Region Mild depletion  Clavicle Bone Region Mild depletion  Clavicle and Acromion Bone Region No depletion  Scapular Bone  Region Mild depletion  Dorsal Hand Mild depletion  Patellar Region Mild depletion  Anterior Thigh Region Mild depletion  Posterior Calf Region Mild depletion  Edema (RD Assessment) None  Hair Reviewed  Eyes Reviewed  [jaundiced]  Mouth Reviewed  Skin Reviewed  [jaundiced]  Nails Reviewed       Diet Order:   Diet Order             Diet Heart Room service appropriate? Yes; Fluid consistency: Thin  Diet effective now                   EDUCATION NEEDS:   Not appropriate for education at this time  Skin:  Skin Assessment: Reviewed RN Assessment (jaundiced)  Last BM:  1/25  Height:   Ht Readings from Last 1 Encounters:  05/03/21 5' 8"  (1.727 m)    Weight:   Wt Readings from Last 1 Encounters:  05/03/21 84.2 kg     BMI:  Body mass index is 28.24 kg/m.  Estimated Nutritional Needs:   Kcal:  2200-2400  Protein:  85-110 gm  Fluid:  1.8-2 L    Lucas Mallow RD, LDN, CNSC Please refer to Amion for contact information.

## 2021-05-03 NOTE — Progress Notes (Signed)
Daily Progress Note   Patient Name: Christopher Carrillo       Date: 05/03/2021 DOB: 04-21-1970  Age: 51 y.o. MRN#: [id number] Attending Physician: Hosie Poisson, MD Primary Care Physician: Susy Frizzle, MD Admit Date: 05/01/2021  Reason for Consultation/Follow-up:Establishing goals of care, "may be eligible for hospice care"  Subjective: Chart review performed. Received report from primary RN - no acute concerns.  Went to visit patient at bedside - wife/[name] present. Patient was lying in bed asleep - he does wake to voice/gentle touch. He is oriented x3. No signs or non-verbal gestures of pain or discomfort noted. No respiratory distress, increased work of breathing, or secretions noted. He reports pain in his abdomen, which has been managed with current medications.  Patient states he has thought about information given to him yesterday - he does not feel like he's ready for hospice care at this time, but has not decided.   Discussion had again per their request on the difference between Palliative and Hospice care. Palliative care and hospice have similar goals of managing symptoms, promoting comfort, improving quality of life, and maintaining a person's dignity. However, palliative care may be offered during any phase of a serious illness, while hospice care is usually offered when a person is expected to live for 6 months or less.  Reviewed with patient that he is at end stages of his disease, and would qualify for hospice care. Education again provided that discharging without hospice care means he is at a high risk for rehospitalization. Encouraged patient to consider how he would like to spend his last months - in and out of the hospital vs goal to remain at home. He requests additional  time to consider options. He is likely discharging tomorrow - requests PMT follow up tomorrow for decision.   Patient's wife is supportive of whichever decision he makes.  All questions and concerns addressed. Encouraged to call with questions and/or concerns. PMT card previously provided.   Length of Stay: 2  Current Medications: Scheduled Meds:   feeding supplement  237 mL Oral TID BM   folic acid  1 mg Oral Daily   heparin  5,000 Units Subcutaneous Q8H   lactulose  10 g Oral Daily   midodrine  10 mg Oral BID WC   multivitamin with minerals  1 tablet  Oral Daily   pantoprazole (PROTONIX) IV  40 mg Intravenous Q12H   predniSONE  40 mg Oral Q breakfast   rifaximin  550 mg Oral BID   sodium bicarbonate  1,300 mg Oral BID   thiamine  100 mg Oral Daily   Or   thiamine  100 mg Intravenous Daily    Continuous Infusions:  albumin human 25 g (05/03/21 0457)   octreotide  (SANDOSTATIN)    IV infusion 50 mcg/hr (05/03/21 0650)    PRN Meds: HYDROmorphone (DILAUDID) injection, lidocaine (PF), ondansetron **OR** ondansetron (ZOFRAN) IV, traMADol  Physical Exam Vitals and nursing note reviewed.  Constitutional:      General: He is not in acute distress.    Appearance: He is ill-appearing.  Pulmonary:     Effort: No respiratory distress.  Skin:    General: Skin is warm and dry.     Coloration: Skin is jaundiced.  Neurological:     Mental Status: He is alert and oriented to person, place, and time.     Motor: Weakness present.  Psychiatric:        Attention and Perception: Attention normal.        Behavior: Behavior is cooperative.        Cognition and Memory: Cognition and memory normal.            Vital Signs: BP 127/79 (BP Location: Right Arm)    Pulse 92    Temp 97.8 F (36.6 C) (Oral)    Resp 17    Ht 5' 8"  (1.727 m)    Wt 84.2 kg    SpO2 100%    BMI 28.24 kg/m  SpO2: SpO2: 100 % O2 Device: O2 Device: Room Air O2 Flow Rate:    Intake/output summary:  Intake/Output  Summary (Last 24 hours) at 05/03/2021 1355 Last data filed at 05/03/2021 0300 Gross per 24 hour  Intake 116.73 ml  Output --  Net 116.73 ml   LBM: Last BM Date: 05/03/21 Baseline Weight: Weight: 84.2 kg Most recent weight: Weight: 84.2 kg       Palliative Assessment/Data: PPS 50-60%    Flowsheet Rows    Flowsheet Row Most Recent Value  Intake Tab   Referral Department Hospitalist  Unit at Time of Referral ER  Palliative Care Primary Diagnosis Other (Comment)  [end stage cirrhosis]  Date Notified 05/02/21  Palliative Care Type New Palliative care  Reason for referral Clarify Goals of Care  Date of Admission 05/01/21  Date first seen by Palliative Care 05/02/21  # of days Palliative referral response time 0 Day(s)  # of days IP prior to Palliative referral 1  Clinical Assessment   Psychosocial & Spiritual Assessment   Palliative Care Outcomes   Patient/Family meeting held? Yes  Who was at the meeting? patient, wife  Palliative Care Outcomes Clarified goals of care, Counseled regarding hospice, Provided advance care planning, Provided psychosocial or spiritual support, Changed CPR status, Completed durable DNR       Patient Active Problem List   Diagnosis Date Noted   Hepatorenal syndrome ([place]) 05/01/2021   ETOH abuse 05/01/2021   COVID-19    Jaundice 04/04/2021   Overweight (BMI 25.0-29.9) 09/28/2020   Nausea & vomiting 09/23/2020   GI bleed 09/23/2020   Hyponatremia 09/23/2020   Hypokalemia 09/23/2020   Dehydration 09/23/2020   Acute kidney failure ([place]) 09/23/2020   Acute diarrhea 51/70/0174   Alcoholic cirrhosis ([place]) 94/49/6759   Encephalopathy, portal systemic 08/09/2020   Polyarthralgia 01/08/2020  History of diverticulosis 10/28/2019   Internal and external bleeding hemorrhoids 08/20/2019   Colon ulcer    Hematochezia 08/12/2019   Leukocytosis 08/12/2019   Coagulopathy ([place])    Lower extremity edema 07/29/2019   Urinary hesitancy 07/29/2019    Anemia of chronic disease 67/03/4579   Alcoholic hepatitis 99/83/3825   Elevated bilirubin    Elevated LFTs 07/14/2019   Hepatic steatosis 02/18/2019   GAD (generalized anxiety disorder) 02/18/2019   GERD (gastroesophageal reflux disease) 04/08/2013   Abdominal pain 04/07/2013   Hyperlipidemia 12/19/2012   Essential hypertension 12/19/2012   Routine general medical examination at a health care facility 08/07/2012   Psoriatic arthritis ([place]) 05/04/2012   Obese 05/04/2012   Tobacco use 05/04/2012    Palliative Care Assessment & Plan   Patient Profile: 51 y.o. male  with past medical history of alcoholic cirrhosis, end-stage liver disease and recent alcoholic hepatitis presented to the ED on 05/01/20 from home - he has regular blood work completed with GI and due to abnormal lab values, worsening creatinine, he was sent to the ED. Patient was admitted on 05/01/2021 with AKI, ETOH abuse, hepatorenal syndrome, alcoholic cirrhosis. Nephrology and GI were consulted and both feel due to his disease progression, not responding to steroids, and ongoing ETOH abuse, his prognosis is likely poor. Noted his MELD-Na score is 36.  Assessment: AKI Alcoholic hepatitis Decompensated alcoholic cirrhosis Hepatorenal syndrome ETOH abuse Malnutrition of moderate degree Concern about end of life  Recommendations/Plan: Continue current medical interventions - patient is considering discharge with either home hospice or outpatient Palliative Care. He requests additional time to consider options - PMT will follow up tomorrow for final decision before discharge. He will likely not be ready to pursue hospice Continue DNR/DNI as previously documented   Goals of Care and Additional Recommendations: Limitations on Scope of Treatment: Full Scope Treatment  Code Status:    Code Status Orders  (From admission, onward)           Start     Ordered   05/02/21 1729  Do not attempt resuscitation (DNR)   Continuous       Question Answer Comment  In the event of cardiac or respiratory ARREST Do not call a code blue   In the event of cardiac or respiratory ARREST Do not perform Intubation, CPR, defibrillation or ACLS   In the event of cardiac or respiratory ARREST Use medication by any route, position, wound care, and other measures to relive pain and suffering. May use oxygen, suction and manual treatment of airway obstruction as needed for comfort.      05/02/21 1728           Code Status History     Date Active Date Inactive Code Status Order ID Comments User Context   05/01/2021 2222 05/02/2021 1728 Full Code [id number]  Etta Quill, DO ED   04/04/2021 0222 04/07/2021 1950 Full Code [id number]  [place], Mower, DO Inpatient   09/22/2020 2313 09/30/2020 2314 Full Code [id number]  Bernadette Hoit, DO ED   08/12/2019 2359 08/15/2019 1650 Full Code [id number]  Reubin Milan, MD ED   07/29/2019 2033 08/06/2019 1946 Full Code [id number]  Orene Desanctis, DO ED   07/21/2019 2206 07/26/2019 1909 Full Code [id number]  Bethena Roys, MD Inpatient       Prognosis:  < 6 months  Discharge Planning: To Be Determined  Care plan was discussed with primary RN, patient, patient's wife, Dr. Karleen Hampshire, [facility]  Thank you  for allowing the Palliative Medicine Team to assist in the care of this patient.   Total Time 37 minutes Prolonged Time Billed  no       Greater than 50%  of this time was spent counseling and coordinating care related to the above assessment and plan.  Lin Landsman, NP  Please contact Palliative Medicine Team phone at [phone] for questions and concerns.

## 2021-05-03 NOTE — Progress Notes (Signed)
CSW received consult for resources for substance use. CSW spoke with patient and offered patient outpatient substance use treatment services resources. Patient accepted. All questions answered. No further questions reported at this time.

## 2021-05-03 NOTE — Progress Notes (Signed)
Responded to consult for 2nd IV site. Pt requested to go to restroom first and stated "it will be a while". VAS Team to return at later time.

## 2021-05-03 NOTE — ED Notes (Signed)
Attempted to call 6E for report, per 6E unable to give report/purple man at this time.

## 2021-05-03 NOTE — Progress Notes (Signed)
Subjective:  UOP not recorded-  crt actually improved quite a bit from 3.4 to 2.8-  BP good-  says has been making urine-  going in toilet   Objective Vital signs in last 24 hours: Vitals:   05/03/21 0016 05/03/21 0204 05/03/21 0451 05/03/21 0856  BP: 130/63 (!) 150/71 127/79   Pulse: 89 90 84 92  Resp: 14 16 17 17   Temp:  97.7 F (36.5 C) 97.8 F (36.6 C) 97.8 F (36.6 C)  TempSrc:  Oral Oral Oral  SpO2: 100% 100% 100% 100%  Weight:  84.2 kg    Height:  5' 8"  (1.727 m)     Weight change:   Intake/Output Summary (Last 24 hours) at 05/03/2021 1022 Last data filed at 05/03/2021 0300 Gross per 24 hour  Intake 116.73 ml  Output --  Net 116.73 ml    Assessment/ Plan: Pt is a 51 y.o. yo male with ETOH cirrhosis who was admitted on 05/01/2021 with AKI  Assessment/Plan: 1. Renal-  crt 1.04 on 04/21/21 and then above 3-  was 3.7 on 1/20, improved to 3.13 and 3.4 yest and 2.8 today !  -  non oliguric-  urine bland-  seems to be consistent with HRS.  Has been started on scheduled albumin, octreotide-  also midodrine as well since there have been a couple of soft BPs.  He is acidotic - started  bicarb-  the  hyponatremia is not unusual in this scenario,  slightly worse.   HD in these cases is tough-  it is not usually recommended because the prognosis is so poor -  we will hope for some stability and possibly improvement.  So far so good, no change in recommendations for now  2.cirrhosis-  GI on board-  lactulose and being treated as well for SBP-  considering diagnostic paracentesis, done-  is  now DNR after discussion with palliative care 3. Anemia-  not a major issue at this time  4. HTN/volume- appears to have ascites-  has little peripheral edema-  giving albumin -  no lasix  Christopher Carrillo    Labs: Basic Metabolic Panel: Recent Labs  Lab 05/01/21 1450 05/02/21 0358 05/03/21 0337  NA 129* 127* 125*  K 3.2* 3.5 3.4*  CL 103 104 100  CO2 15* 12* 15*  GLUCOSE 172* 87 119*   BUN 56* 56* 52*  CREATININE 3.13* 3.40* 2.84*  CALCIUM 8.3* 8.1* 8.4*   Liver Function Tests: Recent Labs  Lab 05/01/21 1450 05/02/21 0358 05/03/21 0337  AST 105* 92* 79*  ALT 52* 44 45*  ALKPHOS 282* 225* 192*  BILITOT 29.7* 26.6* 24.8*  PROT 6.2* 5.4* 6.0*  ALBUMIN 2.0* 2.2* 2.9*   Recent Labs  Lab 05/01/21 1450  LIPASE 134*   No results for input(s): AMMONIA in the last 168 hours. CBC: Recent Labs  Lab 04/28/21 1147 05/01/21 1450 05/02/21 0358 05/03/21 0337  WBC 16.7* 17.4* 16.1* 9.1  NEUTROABS 14,112*  --   --  7.5  HGB 14.0 13.8 12.0* 10.8*  HCT 35.6* 37.1* 33.6* 28.9*  MCV 89.2 95.6 96.0 95.7  PLT 223 218 185 152   Cardiac Enzymes: No results for input(s): CKTOTAL, CKMB, CKMBINDEX, TROPONINI in the last 168 hours. CBG: No results for input(s): GLUCAP in the last 168 hours.  Iron Studies: No results for input(s): IRON, TIBC, TRANSFERRIN, FERRITIN in the last 72 hours. Studies/Results: IR Paracentesis  Result Date: 05/02/2021 INDICATION: Patient with a history of end-stage liver disease presents today  with ascites. Interventional radiology asked to perform a diagnostic paracentesis up to 500 mL. EXAM: ULTRASOUND GUIDED PARACENTESIS MEDICATIONS: 1% lidocaine 10 mL COMPLICATIONS: None immediate. PROCEDURE: Informed written consent was obtained from the patient after a discussion of the risks, benefits and alternatives to treatment. A timeout was performed prior to the initiation of the procedure. Initial ultrasound scanning demonstrates a large amount of ascites within the right lower abdominal quadrant. The right lower abdomen was prepped and draped in the usual sterile fashion. 1% lidocaine was used for local anesthesia. Following this, a 19 gauge, 7-cm, Yueh catheter was introduced. An ultrasound image was saved for documentation purposes. The paracentesis was performed. The catheter was removed and a dressing was applied. The patient tolerated the procedure well  without immediate post procedural complication. FINDINGS: A total of approximately 500 mL of orange fluid was removed. Samples were sent to the laboratory as requested by the clinical team. IMPRESSION: Successful ultrasound-guided paracentesis yielding 500 mL liters of peritoneal fluid. Read by: Soyla Dryer, NP Electronically Signed   By: Corrie Mckusick D.O.   On: 05/02/2021 15:40   Medications: Infusions:  albumin human 25 g (05/03/21 0457)   octreotide  (SANDOSTATIN)    IV infusion 50 mcg/hr (05/03/21 0650)    Scheduled Medications:  folic acid  1 mg Oral Daily   heparin  5,000 Units Subcutaneous Q8H   lactulose  10 g Oral Daily   midodrine  10 mg Oral BID WC   multivitamin with minerals  1 tablet Oral Daily   pantoprazole (PROTONIX) IV  40 mg Intravenous Q12H   predniSONE  40 mg Oral Q breakfast   rifaximin  550 mg Oral BID   sodium bicarbonate  1,300 mg Oral BID   thiamine  100 mg Oral Daily   Or   thiamine  100 mg Intravenous Daily    have reviewed scheduled and prn medications.  Physical Exam: General: soft spoken-  no complaints-  jaundiced Heart: RRR Lungs: mostly clear Abdomen: distended-  fluid wave Extremities: trace peripheral edema     05/03/2021,10:22 AM  LOS: 2 days

## 2021-05-04 ENCOUNTER — Inpatient Hospital Stay (HOSPITAL_COMMUNITY): Payer: Managed Care, Other (non HMO)

## 2021-05-04 DIAGNOSIS — N179 Acute kidney failure, unspecified: Secondary | ICD-10-CM | POA: Diagnosis not present

## 2021-05-04 DIAGNOSIS — F1010 Alcohol abuse, uncomplicated: Secondary | ICD-10-CM | POA: Diagnosis not present

## 2021-05-04 DIAGNOSIS — K7030 Alcoholic cirrhosis of liver without ascites: Secondary | ICD-10-CM | POA: Diagnosis not present

## 2021-05-04 DIAGNOSIS — K767 Hepatorenal syndrome: Secondary | ICD-10-CM | POA: Diagnosis not present

## 2021-05-04 DIAGNOSIS — K7031 Alcoholic cirrhosis of liver with ascites: Secondary | ICD-10-CM | POA: Diagnosis not present

## 2021-05-04 DIAGNOSIS — K7011 Alcoholic hepatitis with ascites: Secondary | ICD-10-CM | POA: Diagnosis not present

## 2021-05-04 LAB — COMPREHENSIVE METABOLIC PANEL
ALT: 56 U/L — ABNORMAL HIGH (ref 0–44)
AST: 84 U/L — ABNORMAL HIGH (ref 15–41)
Albumin: 3.4 g/dL — ABNORMAL LOW (ref 3.5–5.0)
Alkaline Phosphatase: 177 U/L — ABNORMAL HIGH (ref 38–126)
Anion gap: 8 (ref 5–15)
BUN: 41 mg/dL — ABNORMAL HIGH (ref 6–20)
CO2: 17 mmol/L — ABNORMAL LOW (ref 22–32)
Calcium: 8.4 mg/dL — ABNORMAL LOW (ref 8.9–10.3)
Chloride: 109 mmol/L (ref 98–111)
Creatinine, Ser: 2.21 mg/dL — ABNORMAL HIGH (ref 0.61–1.24)
GFR, Estimated: 36 mL/min — ABNORMAL LOW (ref >60)
Glucose, Bld: 135 mg/dL — ABNORMAL HIGH (ref 70–99)
Potassium: 3 mmol/L — ABNORMAL LOW (ref 3.5–5.1)
Sodium: 134 mmol/L — ABNORMAL LOW (ref 135–145)
Total Bilirubin: 19.7 mg/dL (ref 0.3–1.2)
Total Protein: 6.3 g/dL — ABNORMAL LOW (ref 6.5–8.1)

## 2021-05-04 LAB — CBC WITH DIFFERENTIAL/PLATELET
Abs Immature Granulocytes: 0.08 10*3/uL — ABNORMAL HIGH (ref 0.00–0.07)
Basophils Absolute: 0 10*3/uL (ref 0.0–0.1)
Basophils Relative: 0 %
Eosinophils Absolute: 0 10*3/uL (ref 0.0–0.5)
Eosinophils Relative: 0 %
HCT: 28.4 % — ABNORMAL LOW (ref 39.0–52.0)
Hemoglobin: 10.3 g/dL — ABNORMAL LOW (ref 13.0–17.0)
Immature Granulocytes: 1 %
Lymphocytes Relative: 7 %
Lymphs Abs: 0.8 10*3/uL (ref 0.7–4.0)
MCH: 35.5 pg — ABNORMAL HIGH (ref 26.0–34.0)
MCHC: 36.3 g/dL — ABNORMAL HIGH (ref 30.0–36.0)
MCV: 97.9 fL (ref 80.0–100.0)
Monocytes Absolute: 1 10*3/uL (ref 0.1–1.0)
Monocytes Relative: 10 %
Neutro Abs: 8.8 10*3/uL — ABNORMAL HIGH (ref 1.7–7.7)
Neutrophils Relative %: 82 %
Platelets: 175 10*3/uL (ref 150–400)
RBC: 2.9 MIL/uL — ABNORMAL LOW (ref 4.22–5.81)
RDW: 15.8 % — ABNORMAL HIGH (ref 11.5–15.5)
WBC: 10.7 10*3/uL — ABNORMAL HIGH (ref 4.0–10.5)
nRBC: 0 % (ref 0.0–0.2)

## 2021-05-04 MED ORDER — POTASSIUM CHLORIDE CRYS ER 20 MEQ PO TBCR
40.0000 meq | EXTENDED_RELEASE_TABLET | Freq: Once | ORAL | Status: AC
  Administered 2021-05-04: 40 meq via ORAL
  Filled 2021-05-04: qty 2

## 2021-05-04 MED ORDER — DIPHENHYDRAMINE HCL 50 MG/ML IJ SOLN
50.0000 mg | Freq: Once | INTRAMUSCULAR | Status: AC
  Administered 2021-05-04: 50 mg via INTRAVENOUS
  Filled 2021-05-04: qty 1

## 2021-05-04 NOTE — Care Management (Signed)
°  Transition of Care [facility]) Screening Note   Patient Details  Name: Christopher Carrillo Date of Birth: 09/14/1970   Transition of Care [facility]) CM/SW Contact:    Bethena Roys, RN Phone Number: 05/04/2021, 11:47 AM    Transition of Care Department [facility] has reviewed the patient. Patient is from home with spouse. Patient states plan will be to go home with outpatient palliative services. Patient and spouse are agreeable to [facility] if the agency is in network. Case Manager made the outpatient referral and [name] is reviewing the insurance to see if they will be in network. Case Manager will continue to follow for additional transition of care needs.

## 2021-05-04 NOTE — TOC Initial Note (Signed)
Transition of Care [facility]) - Initial/Assessment Note    Patient Details  Name: Christopher Carrillo MRN: [id number] Date of Birth: 12/15/1970  Transition of Care [facility]) CM/SW Contact:    Bethena Roys, RN Phone Number: 05/04/2021, 3:13 PM  Clinical Narrative:  Case Manager spoke with spouse after palliative care meeting. Patient and spouse are agreeable to home with hospice services. Referral re-submitted to [facility] for [facility] and cancelled the palliative services. Case Manager discussed durable medical equipment (DME) and family did not need DME at this time. [place] to assess the need for DME in the home. Patient to travel home via private vehicle once stable. Case Manager will continue to follow for additional transition of care needs.                  Expected Discharge Plan: Home w Hospice Care Barriers to Discharge: No Barriers Identified   Patient Goals and CMS Choice Patient states their goals for this hospitalization and ongoing recovery are:: Home with Hospice Services   Choice offered to / list presented to : Patient, Spouse (Family wnated to use [place] since palliaitve was gong to be utilized with them.)  Expected Discharge Plan and Services Expected Discharge Plan: [place] In-house Referral: Hospice / [facility] Discharge Planning Services: CM Consult Post Acute Care Choice: Hospice Living arrangements for the past 2 months: Single Family Home                   DME Agency: NA         HH Agency: [facility] [place] [facility] Date [facility] Agency Contacted: 05/04/21 Time HH Agency Contacted: 74 Representative spoke with at [place]: Roselee Nova  Prior Living Arrangements/Services Living arrangements for the past 2 months: [place] Lives with:: Spouse Patient language and need for interpreter reviewed:: Yes Do you feel safe going back to the  place where you live?: Yes      Need for Family Participation in Patient Care: Yes (Comment) Care giver support system in place?: Yes (comment)   Criminal Activity/Legal Involvement Pertinent to Current Situation/Hospitalization: No - Comment as needed  Activities of Daily Living Home Assistive Devices/Equipment: None ADL Screening (condition at time of admission) Patient's cognitive ability adequate to safely complete daily activities?: Yes Is the patient deaf or have difficulty hearing?: No Does the patient have difficulty seeing, even when wearing glasses/contacts?: Yes Does the patient have difficulty concentrating, remembering, or making decisions?: No Patient able to express need for assistance with ADLs?: Yes Does the patient have difficulty dressing or bathing?: No Independently performs ADLs?: Yes (appropriate for developmental age) Does the patient have difficulty walking or climbing stairs?: Yes Weakness of Legs: Both Weakness of Arms/Hands: Both  Permission Sought/Granted Permission sought to share information with : Family Supports, Customer service manager, Case Optician, dispensing granted to share information with : Yes, Verbal Permission Granted     Permission granted to share info w AGENCY: Cabin crew.        Emotional Assessment Appearance:: Appears stated age Attitude/Demeanor/Rapport: Engaged Affect (typically observed): Appropriate Orientation: : Oriented to  Time, Oriented to Place, Oriented to Self, Oriented to Situation Alcohol / Substance Use: Not Applicable Psych Involvement: No (comment)  Admission diagnosis:  Hepatorenal syndrome ([place]) [K76.7] AKI (acute kidney injury) ([place]) [N17.9] Ascites [R18.8] Patient Active Problem List   Diagnosis Date Noted   Malnutrition of moderate degree 05/03/2021  Ascites    Hepatorenal syndrome ([place]) 05/01/2021   ETOH abuse 05/01/2021   COVID-19    Jaundice 04/04/2021   Overweight (BMI  25.0-29.9) 09/28/2020   Nausea & vomiting 09/23/2020   GI bleed 09/23/2020   Hyponatremia 09/23/2020   Hypokalemia 09/23/2020   Dehydration 09/23/2020   Acute kidney failure ([place]) 09/23/2020   Acute diarrhea 81/85/9093   Alcoholic cirrhosis ([place]) 02/28/6243   Encephalopathy, portal systemic 08/09/2020   Polyarthralgia 01/08/2020   History of diverticulosis 10/28/2019   Internal and external bleeding hemorrhoids 08/20/2019   Colon ulcer    Hematochezia 08/12/2019   Leukocytosis 08/12/2019   Coagulopathy ([place])    Lower extremity edema 07/29/2019   Urinary hesitancy 07/29/2019   Anemia of chronic disease 69/50/7225   Alcoholic hepatitis 75/08/1831   Elevated bilirubin    Elevated LFTs 07/14/2019   Hepatic steatosis 02/18/2019   GAD (generalized anxiety disorder) 02/18/2019   GERD (gastroesophageal reflux disease) 04/08/2013   Abdominal pain 04/07/2013   Hyperlipidemia 12/19/2012   Essential hypertension 12/19/2012   Routine general medical examination at a health care facility 08/07/2012   Psoriatic arthritis ([place]) 05/04/2012   Obese 05/04/2012   Tobacco use 05/04/2012   PCP:  Susy Frizzle, MD Pharmacy:   [facility] [place], [place] AT [place] [address] [place] [address] Phone: [phone] Fax: [phone]     Social Determinants of Health (SDOH) Interventions    Readmission Risk Interventions No flowsheet data found.

## 2021-05-04 NOTE — Progress Notes (Signed)
Subjective:  UOP 1800 -  crt actually improved again from 2.8 to 2.2-  BP good-    Objective Vital signs in last 24 hours: Vitals:   05/03/21 0856 05/03/21 1449 05/03/21 2100 05/04/21 0914  BP:   138/78 124/69  Pulse: 92 91 78   Resp: 17 17 18    Temp: 97.8 F (36.6 C) (!) 97.5 F (36.4 C) (!) 97.4 F (36.3 C)   TempSrc: Oral Oral Oral   SpO2: 100%     Weight:      Height:       Weight change:   Intake/Output Summary (Last 24 hours) at 05/04/2021 1111 Last data filed at 05/04/2021 1000 Gross per 24 hour  Intake 2368.68 ml  Output --  Net 2368.68 ml    Assessment/ Plan: Pt is a 51 y.o. yo male with ETOH cirrhosis who was admitted on 05/01/2021 with AKI  Assessment/Plan: 1. Renal-  crt 1.04 on 04/21/21 and then above 3-  was 3.7 on 1/20, improved to 3.13 and 3.4 yest but now a steady improvement-  non oliguric-  urine bland-  seems to be consistent with HRS.  Has been started on scheduled albumin, octreotide-  also midodrine as well since there have been a couple of soft BPs.  He is acidotic - started  bicarb-  hyponatremia is not unusual in this scenario.   HD in these cases is tough-  it is not usually recommended because the prognosis is so poor -  we will hope for some stability and possibly improvement.  So far so good, all parameters improving-  no change in recommendations for now  2.cirrhosis-  GI on board-  lactulose and being treated as well for SBP-  considering diagnostic paracentesis, done-  is  now DNR after discussion with palliative care 3. Anemia-  not a major issue at this time  4. HTN/volume- appears to have ascites-  has little peripheral edema-  giving albumin -  no lasix-   doing well with that however  [place]: Basic Metabolic Panel: Recent Labs  Lab 05/02/21 0358 05/03/21 0337 05/04/21 0643  NA 127* 125* 134*  K 3.5 3.4* 3.0*  CL 104 100 109  CO2 12* 15* 17*  GLUCOSE 87 119* 135*  BUN 56* 52* 41*  CREATININE 3.40* 2.84* 2.21*   CALCIUM 8.1* 8.4* 8.4*   Liver Function Tests: Recent Labs  Lab 05/02/21 0358 05/03/21 0337 05/04/21 0643  AST 92* 79* 84*  ALT 44 45* 56*  ALKPHOS 225* 192* 177*  BILITOT 26.6* 24.8* 19.7*  PROT 5.4* 6.0* 6.3*  ALBUMIN 2.2* 2.9* 3.4*   Recent Labs  Lab 05/01/21 1450  LIPASE 134*   No results for input(s): AMMONIA in the last 168 hours. CBC: Recent Labs  Lab 04/28/21 1147 05/01/21 1450 05/02/21 0358 05/03/21 0337 05/04/21 0643  WBC 16.7* 17.4* 16.1* 9.1 10.7*  NEUTROABS 14,112*  --   --  7.5 8.8*  HGB 14.0 13.8 12.0* 10.8* 10.3*  HCT 35.6* 37.1* 33.6* 28.9* 28.4*  MCV 89.2 95.6 96.0 95.7 97.9  PLT 223 218 185 152 175   Cardiac Enzymes: No results for input(s): CKTOTAL, CKMB, CKMBINDEX, TROPONINI in the last 168 hours. CBG: No results for input(s): GLUCAP in the last 168 hours.  Iron Studies: No results for input(s): IRON, TIBC, TRANSFERRIN, FERRITIN in the last 72 hours. Studies/Results: CT ABDOMEN PELVIS WO CONTRAST  Result Date: 05/04/2021 CLINICAL DATA:  Left lower quadrant pain. EXAM: CT  ABDOMEN AND PELVIS WITHOUT CONTRAST TECHNIQUE: Multidetector CT imaging of the abdomen and pelvis was performed following the standard protocol without IV contrast. RADIATION DOSE REDUCTION: This exam was performed according to the departmental dose-optimization program which includes automated exposure control, adjustment of the mA and/or kV according to patient size and/or use of iterative reconstruction technique. COMPARISON:  CT with IV contrast 09/28/2020, 08/12/2019 FINDINGS: Lower chest: No acute abnormality. There is calcification in the right coronary artery. Normal cardiac size. Hepatobiliary: The liver is 16.3 cm length mildly steatotic and there are morphologic changes of hepatic cirrhosis chronically seen. The hepatic portal main vein is upper normal in caliber. No focal lesion is seen in the liver without contrast. The gallbladder wall again is slightly thickened.  There is layering density not seen previously which could be dense sludge or gravel new in the interval. Pancreas: Unremarkable without contrast.  No adjacent edema. Spleen: Mildly enlarged, 13.5 cm transverse, stable. Adrenals/Urinary Tract: There is no adrenal mass no focal abnormality in the unenhanced renal cortex and no stones or hydronephrosis either side. The bladder thickness mildly prominent but the bladder is not fully distended. Stomach/Bowel: There is diffuse gastric fold thickening consistent with gastritis or portal gastropathy. Thickened folds involve multiple left abdominal small bowel segments as well. No small bowel dilatation or inflammation is seen. There is wall thickening or underdistention of the ascending colon, remainder of the large intestine walls normal thickness with scattered uncomplicated sigmoid diverticula. Appendix is unremarkable. Vascular/Lymphatic: Mild patchy aortoiliac calcific disease. No AAA. Shotty subcentimeter up to borderline size retroperitoneal nodes are unchanged. No inguinal or pelvic adenopathy. There are relatively small perigastric and omental varices. There are no large varices. Reproductive: No prostatomegaly. Other: There mesenteric congestive features and mild abdominopelvic free ascites with scattered fluid in the mesenteric folds. The deepest pocket is within the anterior right iliac fossa measuring 4.1 cm AP. Others would not be drainable. There is no free air, hemorrhage or abscess. There are small umbilical and inguinal fat hernias. Musculoskeletal: Mild degenerative change lumbar spine. No worrisome regional skeletal lesion. There are mild features of body wall anasarca increased over prior study. IMPRESSION: 1. Cirrhosis, mesenteric congestive changes and increased mild abdominopelvic free ascites. 2. Mild splenomegaly and upper limit of normal portal vein caliber with small upper abdominal varices. 3. Ascending colitis versus changes of portal  hypertension with portal colopathy. 4. Gastroenteritis versus changes of portal hypertension or reactive wall thickening from the ascites. 5. Increased changes of body wall anasarca. 6. Moderately dense layering material newly noted in the gallbladder lumen which could be layering dense sludge or gravel , not seen previously. 7. The gallbladder wall slightly thickened, which was seen previously and is probably due to hepatic dysfunction. Correlate clinically for early cholecystitis. 8. Cystitis versus bladder nondistention. 9. Aortic atherosclerosis. Electronically Signed   By: Telford Nab M.D.   On: 05/04/2021 06:04   IR Paracentesis  Result Date: 05/02/2021 INDICATION: Patient with a history of end-stage liver disease presents today with ascites. Interventional radiology asked to perform a diagnostic paracentesis up to 500 mL. EXAM: ULTRASOUND GUIDED PARACENTESIS MEDICATIONS: 1% lidocaine 10 mL COMPLICATIONS: None immediate. PROCEDURE: Informed written consent was obtained from the patient after a discussion of the risks, benefits and alternatives to treatment. A timeout was performed prior to the initiation of the procedure. Initial ultrasound scanning demonstrates a large amount of ascites within the right lower abdominal quadrant. The right lower abdomen was prepped and draped in the usual sterile fashion.  1% lidocaine was used for local anesthesia. Following this, a 19 gauge, 7-cm, Yueh catheter was introduced. An ultrasound image was saved for documentation purposes. The paracentesis was performed. The catheter was removed and a dressing was applied. The patient tolerated the procedure well without immediate post procedural complication. FINDINGS: A total of approximately 500 mL of orange fluid was removed. Samples were sent to the laboratory as requested by the clinical team. IMPRESSION: Successful ultrasound-guided paracentesis yielding 500 mL liters of peritoneal fluid. Read by: Soyla Dryer, NP  Electronically Signed   By: Corrie Mckusick D.O.   On: 05/02/2021 15:40   Medications: Infusions:  albumin human 25 g (05/04/21 0649)   octreotide  (SANDOSTATIN)    IV infusion 50 mcg/hr (05/04/21 0358)    Scheduled Medications:  feeding supplement  237 mL Oral TID BM   folic acid  1 mg Oral Daily   heparin  5,000 Units Subcutaneous Q8H   lactulose  10 g Oral Daily   midodrine  10 mg Oral BID WC   multivitamin with minerals  1 tablet Oral Daily   pantoprazole (PROTONIX) IV  40 mg Intravenous Q12H   predniSONE  40 mg Oral Q breakfast   rifaximin  550 mg Oral BID   sodium bicarbonate  1,300 mg Oral BID   thiamine  100 mg Oral Daily   Or   thiamine  100 mg Intravenous Daily    have reviewed scheduled and prn medications.  Physical Exam: General: soft spoken-  no complaints-  jaundiced Heart: RRR Lungs: mostly clear Abdomen: distended-  fluid wave Extremities: trace peripheral edema     05/04/2021,11:11 AM  LOS: 3 days

## 2021-05-04 NOTE — Progress Notes (Addendum)
Progress Note   Subjective  Chief Complaint: AKI likely HRS with decompensated cirrhosis  Wife at bedside. Loose bowel movement this morning, patient states was black. Continues to have abdominal pain, diffuse, has bruising along lower AB that is new.  Denies fever, chills, nausea, vomiting. Patient is making urine at this time.    Objective   Vital signs in last 24 hours: Temp:  [97.4 F (36.3 C)-97.5 F (36.4 C)] 97.4 F (36.3 C) (01/25 2100) Pulse Rate:  [78-91] 78 (01/25 2100) Resp:  [17-18] 18 (01/25 2100) BP: (138)/(78) 138/78 (01/25 2100) Last BM Date: 05/03/21  General:   Alert, well developed male in no acute distress Eyes: scleral icterus,conjunctive icteric  Heart:  regular rate and rhythm, no murmurs or gallops Pulm: Clear anteriorly; no wheezing Abdomen:  Soft, Obese AB, skin exam normal, Normal bowel sounds. mild tenderness in the entire abdomen.  Worse LLQ with ecchymosis, no  fluid wave,  dullness in flanks   Extremities:  Without edema. Neurologic:  Alert and  oriented x4;  grossly normal neurologically. without asterixis or clonus.  Skin:  with jaundice, lower AB ecchymosis. no palmar erythema or spider angioma.   Psychiatric: Demonstrates good judgement and reason without abnormal affect or behaviors.  Intake/Output from previous day: 01/25 0701 - 01/26 0700 In: 1489.5 [P.O.:540; I.V.:426.5; IV Piggyback:523] Out: -  Intake/Output this shift: No intake/output data recorded.  Lab Results: Recent Labs    05/02/21 0358 05/03/21 0337 05/04/21 0643  WBC 16.1* 9.1 10.7*  HGB 12.0* 10.8* 10.3*  HCT 33.6* 28.9* 28.4*  PLT 185 152 175   BMET Recent Labs    05/02/21 0358 05/03/21 0337 05/04/21 0643  NA 127* 125* 134*  K 3.5 3.4* 3.0*  CL 104 100 109  CO2 12* 15* 17*  GLUCOSE 87 119* 135*  BUN 56* 52* 41*  CREATININE 3.40* 2.84* 2.21*  CALCIUM 8.1* 8.4* 8.4*   LFT Recent Labs    05/04/21 0643  PROT 6.3*  ALBUMIN 3.4*  AST 84*  ALT  56*  ALKPHOS 177*  BILITOT 19.7*   PT/INR Recent Labs    05/01/21 1608 05/02/21 1235  LABPROT 16.7* 18.8*  INR 1.4* 1.6*    Studies/Results: CT ABDOMEN PELVIS WO CONTRAST  Result Date: 05/04/2021 CLINICAL DATA:  Left lower quadrant pain. EXAM: CT ABDOMEN AND PELVIS WITHOUT CONTRAST TECHNIQUE: Multidetector CT imaging of the abdomen and pelvis was performed following the standard protocol without IV contrast. RADIATION DOSE REDUCTION: This exam was performed according to the departmental dose-optimization program which includes automated exposure control, adjustment of the mA and/or kV according to patient size and/or use of iterative reconstruction technique. COMPARISON:  CT with IV contrast 09/28/2020, 08/12/2019 FINDINGS: Lower chest: No acute abnormality. There is calcification in the right coronary artery. Normal cardiac size. Hepatobiliary: The liver is 16.3 cm length mildly steatotic and there are morphologic changes of hepatic cirrhosis chronically seen. The hepatic portal main vein is upper normal in caliber. No focal lesion is seen in the liver without contrast. The gallbladder wall again is slightly thickened. There is layering density not seen previously which could be dense sludge or gravel new in the interval. Pancreas: Unremarkable without contrast.  No adjacent edema. Spleen: Mildly enlarged, 13.5 cm transverse, stable. Adrenals/Urinary Tract: There is no adrenal mass no focal abnormality in the unenhanced renal cortex and no stones or hydronephrosis either side. The bladder thickness mildly prominent but the bladder is not fully distended. Stomach/Bowel: There is diffuse gastric  fold thickening consistent with gastritis or portal gastropathy. Thickened folds involve multiple left abdominal small bowel segments as well. No small bowel dilatation or inflammation is seen. There is wall thickening or underdistention of the ascending colon, remainder of the large intestine walls normal  thickness with scattered uncomplicated sigmoid diverticula. Appendix is unremarkable. Vascular/Lymphatic: Mild patchy aortoiliac calcific disease. No AAA. Shotty subcentimeter up to borderline size retroperitoneal nodes are unchanged. No inguinal or pelvic adenopathy. There are relatively small perigastric and omental varices. There are no large varices. Reproductive: No prostatomegaly. Other: There mesenteric congestive features and mild abdominopelvic free ascites with scattered fluid in the mesenteric folds. The deepest pocket is within the anterior right iliac fossa measuring 4.1 cm AP. Others would not be drainable. There is no free air, hemorrhage or abscess. There are small umbilical and inguinal fat hernias. Musculoskeletal: Mild degenerative change lumbar spine. No worrisome regional skeletal lesion. There are mild features of body wall anasarca increased over prior study. IMPRESSION: 1. Cirrhosis, mesenteric congestive changes and increased mild abdominopelvic free ascites. 2. Mild splenomegaly and upper limit of normal portal vein caliber with small upper abdominal varices. 3. Ascending colitis versus changes of portal hypertension with portal colopathy. 4. Gastroenteritis versus changes of portal hypertension or reactive wall thickening from the ascites. 5. Increased changes of body wall anasarca. 6. Moderately dense layering material newly noted in the gallbladder lumen which could be layering dense sludge or gravel , not seen previously. 7. The gallbladder wall slightly thickened, which was seen previously and is probably due to hepatic dysfunction. Correlate clinically for early cholecystitis. 8. Cystitis versus bladder nondistention. 9. Aortic atherosclerosis. Electronically Signed   By: Telford Nab M.D.   On: 05/04/2021 06:04   IR Paracentesis  Result Date: 05/02/2021 INDICATION: Patient with a history of end-stage liver disease presents today with ascites. Interventional radiology asked to  perform a diagnostic paracentesis up to 500 mL. EXAM: ULTRASOUND GUIDED PARACENTESIS MEDICATIONS: 1% lidocaine 10 mL COMPLICATIONS: None immediate. PROCEDURE: Informed written consent was obtained from the patient after a discussion of the risks, benefits and alternatives to treatment. A timeout was performed prior to the initiation of the procedure. Initial ultrasound scanning demonstrates a large amount of ascites within the right lower abdominal quadrant. The right lower abdomen was prepped and draped in the usual sterile fashion. 1% lidocaine was used for local anesthesia. Following this, a 19 gauge, 7-cm, Yueh catheter was introduced. An ultrasound image was saved for documentation purposes. The paracentesis was performed. The catheter was removed and a dressing was applied. The patient tolerated the procedure well without immediate post procedural complication. FINDINGS: A total of approximately 500 mL of orange fluid was removed. Samples were sent to the laboratory as requested by the clinical team. IMPRESSION: Successful ultrasound-guided paracentesis yielding 500 mL liters of peritoneal fluid. Read by: Soyla Dryer, NP Electronically Signed   By: Corrie Mckusick D.O.   On: 05/02/2021 15:40      Impression/Plan:   Decompensated cirrhosis secondary continuing ETOH use with history of hepatic encephalopathy and ascites AST 84 ALT 56 Alkphos 177 TBili 19.7 INR 1.6 MELD-Na score: 32 (36)  Discriminate Score 52.8, on prednisone 40 mg daily, will calculate [place] score day 7 (01/31) Discussed while labs may seem to be improving, continues to have very high mortality rate and very poor prognosis Palliative has been consulted- appreciate their input Patient now DNR/DNI   Ascites S/p diagnostic paracentesis Negative for SBP, gram stain neg  AB pain No SBP  CT AB with diffuse portal hypertension and ascites, has new gallbladder wall thickening versus from ascites with sludge versus gravel LFTs  down trending, WBC was improving, no Fever, chills, no significant RUQ AB pain Will stop ABX,  clinical picture does not match cholecystitis Some ecchymosis bilateral flank? Causing, monitor Can consider RUQ [place] if labs worsening, symptoms get worse.   Esophageal Varices no evidence last EGD : Last EGD Dec 2022 without evidence of varices, showed portal gastropathy and mild duodenitis. Have melena/hemtochezia x 1 week without significant drop in H/H With recent EGD likely repeating would be low yield Monitor H/H transfuse to keep around 7 On octeotride and protonix BID    Acute renal failure possibly due to hepatorenal syndrome BUN from 1.0 on 01/13 to 3.7 on 1/20 BUN 52 Cr 2.84 BUN 41 Cr 2.21- improving -Avoid large volume paracentesis -Diuretics on hold, DC antihypertensives --Avoid nephrotoxic drugs -albumin with HRS dosing, continue octreotide and midodrine - appreciate nephrology input   Alcohol abuse -Alcohol Abstinence counseling discussed with patient as continued use is strongly associated with worsening liver disease progression  -Monitor for withdrawal symptoms while inpatient -Empiric treatment with folic acid, multivitamin and thiamine.  --No role for liver transplantation given ongoing alcohol use   Hepatic Encephalopathy:  No evidence this admission continue meds - Lactulose, 24m titrate to 3bms per day - Rifaximin [id number] - minimize/remove all benzos and narcotics   With history of coagulopathy present INR 1.6 Check PT/INR daily   Future Appointments  Date Time Provider DePrado Verde2/21/2023  3:00 PM BeThornton ParkMD [facility]      LOS: 3 days   AmVladimir Crofts1/26/2023, 10:12 AM

## 2021-05-04 NOTE — Progress Notes (Signed)
PROGRESS NOTE    Christopher Carrillo  ZWC:[id number] DOB: 10-24-1970 DOA: 05/01/2021 PCP: Susy Frizzle, MD    Chief Complaint  Patient presents with   Abnormal Lab    Brief Narrative:  51 year old with history of alcoholic cirrhosis, end-stage liver disease and recent alcoholic hepatitis who was following up with GI as outpatient, recent worsening creatinine so sent to ER. Patient reports diffuse abdominal pain, generalized weakness and intermittent dizziness. Pt underwent paracentesis on 05/02/21  and 500 ml removed.  Negative for SBP, gram stain is negative. Gastroenterology and nephrology on board and following.  Pt currently on octreotide gtt, with discriminate score of 52.8 and on prednisone 40 mg daily. Patient has poor prognosis and has a very high morality rate. Meanwhile palliative care consulted but patient does not want to make any decisions at this time, he would like to to go home with palliative care services.    Assessment & Plan:   Principal Problem:   Acute kidney failure (HCC) Active Problems:   Alcoholic hepatitis   Alcoholic cirrhosis (HCC)   Hepatorenal syndrome (HCC)   ETOH abuse   Malnutrition of moderate degree   Ascites  Decompensated cirrhosis secondary to continuous EtOH use Hepatic encephalopathy with ascites Meld score is 32 today.  S/p diagnostic paracentesis on 05/02/21, no signs of SBP. Antibiotics discontinues.  No role for liver transplantation given ongoing alcohol use   Mild generalized abdominal pain Intermittent , most with eating and associated with some nausea.  CT abd and pelvis without contrast reviewed.  No indication for antibiotics.    Esophageal varices Continue with Protonix twice daily and octreotide.   AKI secondary to hepatorenal syndrome Avoid large volume paracentesis, diuretics on hold. Continue with octreotide and midodrine. Nephrology on board and appreciate recommendations. Improving renal parameters. Bicarb  improving .  Continue with bicarb tablets.     History of hepatic encephalopathy Continue with lactulose and rifaximin.  coagulopathy Continue with PT/INR monitoring.    Hypokalemia Replaced, repeat in am.   DVT prophylaxis: heparin Code Status: DNR Family Communication: family at bedside.  Disposition:   Status is: Inpatient  Remains inpatient appropriate because: octreotide gtt.      Consultants:  GI PALLIATIVE CARE NEPHROLOGY.  Procedures: none.   Antimicrobials: none.    Subjective: Some abd pain with nausea with eating.  No new complaints.   Objective: Vitals:   05/03/21 1449 05/03/21 2100 05/04/21 0914 05/04/21 1100  BP:  138/78 124/69 132/75  Pulse: 91 78  81  Resp: 17 18  20   Temp: (!) 97.5 F (36.4 C) (!) 97.4 F (36.3 C)  97.7 F (36.5 C)  TempSrc: Oral Oral  Oral  SpO2:    99%  Weight:      Height:        Intake/Output Summary (Last 24 hours) at 05/04/2021 1301 Last data filed at 05/04/2021 1000 Gross per 24 hour  Intake 2368.68 ml  Output --  Net 2368.68 ml    Filed Weights   05/03/21 0204  Weight: 84.2 kg    Examination:  General exam: appears comfortable Jaundiced.  Respiratory system: Clear to auscultation. Respiratory effort normal. Cardiovascular system: S1 & S2 heard, RRR. No JVD,  pedal edema present.  Gastrointestinal system: Abdomen is soft mild generalized tenderness.  Normal bowel sounds heard. Central nervous system: Alert and oriented. No focal neurological deficits. Extremities: Symmetric 5 x 5 power. Skin: No rashes, Psychiatry: flat affect.     Data Reviewed: I have personally reviewed  following labs and imaging studies  CBC: Recent Labs  Lab 04/28/21 1147 05/01/21 1450 05/02/21 0358 05/03/21 0337 05/04/21 0643  WBC 16.7* 17.4* 16.1* 9.1 10.7*  NEUTROABS 14,112*  --   --  7.5 8.8*  HGB 14.0 13.8 12.0* 10.8* 10.3*  HCT 35.6* 37.1* 33.6* 28.9* 28.4*  MCV 89.2 95.6 96.0 95.7 97.9  PLT 223 218 185  152 175     Basic Metabolic Panel: Recent Labs  Lab 04/28/21 1147 05/01/21 1450 05/02/21 0358 05/03/21 0337 05/04/21 0643  NA 129* 129* 127* 125* 134*  K 3.5 3.2* 3.5 3.4* 3.0*  CL 100 103 104 100 109  CO2 16* 15* 12* 15* 17*  GLUCOSE 115* 172* 87 119* 135*  BUN 47* 56* 56* 52* 41*  CREATININE 3.76* 3.13* 3.40* 2.84* 2.21*  CALCIUM 8.6 8.3* 8.1* 8.4* 8.4*     GFR: Estimated Creatinine Clearance: 42.7 mL/min (A) (by C-G formula based on SCr of 2.21 mg/dL (H)).  Liver Function Tests: Recent Labs  Lab 04/28/21 1147 05/01/21 1450 05/02/21 0358 05/03/21 0337 05/04/21 0643  AST 114* 105* 92* 79* 84*  ALT 47* 52* 44 45* 56*  ALKPHOS  --  282* 225* 192* 177*  BILITOT 37.1* 29.7* 26.6* 24.8* 19.7*  PROT 5.3* 6.2* 5.4* 6.0* 6.3*  ALBUMIN  --  2.0* 2.2* 2.9* 3.4*     CBG: No results for input(s): GLUCAP in the last 168 hours.   Recent Results (from the past 240 hour(s))  Culture, body fluid w Gram Stain-bottle     Status: None (Preliminary result)   Collection Time: 05/02/21  3:27 PM   Specimen: Fluid  Result Value Ref Range Status   Specimen Description FLUID  Final   Special Requests PERITONEAL  Final   Culture   Final    NO GROWTH 2 DAYS Performed at [facility], [address], [place], [place] [address]    Report Status PENDING  Incomplete  Gram stain     Status: None   Collection Time: 05/02/21  3:27 PM   Specimen: Fluid  Result Value Ref Range Status   Specimen Description FLUID  Final   Special Requests PERITONEAL  Final   Gram Stain   Final    WBC PRESENT,BOTH PMN AND MONONUCLEAR NO ORGANISMS SEEN CYTOSPIN SMEAR Performed at [facility], [address] [place], [place] [address]    Report Status 05/02/2021 FINAL  Final          Radiology Studies: CT ABDOMEN PELVIS WO CONTRAST  Result Date: 05/04/2021 CLINICAL DATA:  Left lower quadrant pain. EXAM: CT ABDOMEN AND PELVIS WITHOUT CONTRAST TECHNIQUE: Multidetector CT imaging  of the abdomen and pelvis was performed following the standard protocol without IV contrast. RADIATION DOSE REDUCTION: This exam was performed according to the departmental dose-optimization program which includes automated exposure control, adjustment of the mA and/or kV according to patient size and/or use of iterative reconstruction technique. COMPARISON:  CT with IV contrast 09/28/2020, 08/12/2019 FINDINGS: Lower chest: No acute abnormality. There is calcification in the right coronary artery. Normal cardiac size. Hepatobiliary: The liver is 16.3 cm length mildly steatotic and there are morphologic changes of hepatic cirrhosis chronically seen. The hepatic portal main vein is upper normal in caliber. No focal lesion is seen in the liver without contrast. The gallbladder wall again is slightly thickened. There is layering density not seen previously which could be dense sludge or gravel new in the interval. Pancreas: Unremarkable without contrast.  No  adjacent edema. Spleen: Mildly enlarged, 13.5 cm transverse, stable. Adrenals/Urinary Tract: There is no adrenal mass no focal abnormality in the unenhanced renal cortex and no stones or hydronephrosis either side. The bladder thickness mildly prominent but the bladder is not fully distended. Stomach/Bowel: There is diffuse gastric fold thickening consistent with gastritis or portal gastropathy. Thickened folds involve multiple left abdominal small bowel segments as well. No small bowel dilatation or inflammation is seen. There is wall thickening or underdistention of the ascending colon, remainder of the large intestine walls normal thickness with scattered uncomplicated sigmoid diverticula. Appendix is unremarkable. Vascular/Lymphatic: Mild patchy aortoiliac calcific disease. No AAA. Shotty subcentimeter up to borderline size retroperitoneal nodes are unchanged. No inguinal or pelvic adenopathy. There are relatively small perigastric and omental varices. There are  no large varices. Reproductive: No prostatomegaly. Other: There mesenteric congestive features and mild abdominopelvic free ascites with scattered fluid in the mesenteric folds. The deepest pocket is within the anterior right iliac fossa measuring 4.1 cm AP. Others would not be drainable. There is no free air, hemorrhage or abscess. There are small umbilical and inguinal fat hernias. Musculoskeletal: Mild degenerative change lumbar spine. No worrisome regional skeletal lesion. There are mild features of body wall anasarca increased over prior study. IMPRESSION: 1. Cirrhosis, mesenteric congestive changes and increased mild abdominopelvic free ascites. 2. Mild splenomegaly and upper limit of normal portal vein caliber with small upper abdominal varices. 3. Ascending colitis versus changes of portal hypertension with portal colopathy. 4. Gastroenteritis versus changes of portal hypertension or reactive wall thickening from the ascites. 5. Increased changes of body wall anasarca. 6. Moderately dense layering material newly noted in the gallbladder lumen which could be layering dense sludge or gravel , not seen previously. 7. The gallbladder wall slightly thickened, which was seen previously and is probably due to hepatic dysfunction. Correlate clinically for early cholecystitis. 8. Cystitis versus bladder nondistention. 9. Aortic atherosclerosis. Electronically Signed   By: Telford Nab M.D.   On: 05/04/2021 06:04   IR Paracentesis  Result Date: 05/02/2021 INDICATION: Patient with a history of end-stage liver disease presents today with ascites. Interventional radiology asked to perform a diagnostic paracentesis up to 500 mL. EXAM: ULTRASOUND GUIDED PARACENTESIS MEDICATIONS: 1% lidocaine 10 mL COMPLICATIONS: None immediate. PROCEDURE: Informed written consent was obtained from the patient after a discussion of the risks, benefits and alternatives to treatment. A timeout was performed prior to the initiation of  the procedure. Initial ultrasound scanning demonstrates a large amount of ascites within the right lower abdominal quadrant. The right lower abdomen was prepped and draped in the usual sterile fashion. 1% lidocaine was used for local anesthesia. Following this, a 19 gauge, 7-cm, Yueh catheter was introduced. An ultrasound image was saved for documentation purposes. The paracentesis was performed. The catheter was removed and a dressing was applied. The patient tolerated the procedure well without immediate post procedural complication. FINDINGS: A total of approximately 500 mL of orange fluid was removed. Samples were sent to the laboratory as requested by the clinical team. IMPRESSION: Successful ultrasound-guided paracentesis yielding 500 mL liters of peritoneal fluid. Read by: Soyla Dryer, NP Electronically Signed   By: Corrie Mckusick D.O.   On: 05/02/2021 15:40        Scheduled Meds:  feeding supplement  237 mL Oral TID BM   folic acid  1 mg Oral Daily   heparin  5,000 Units Subcutaneous Q8H   lactulose  10 g Oral Daily   midodrine  10 mg  Oral BID WC   multivitamin with minerals  1 tablet Oral Daily   pantoprazole (PROTONIX) IV  40 mg Intravenous Q12H   predniSONE  40 mg Oral Q breakfast   rifaximin  550 mg Oral BID   sodium bicarbonate  1,300 mg Oral BID   thiamine  100 mg Oral Daily   Or   thiamine  100 mg Intravenous Daily   Continuous Infusions:  albumin human 25 g (05/04/21 0649)   octreotide  (SANDOSTATIN)    IV infusion 50 mcg/hr (05/04/21 1124)     LOS: 3 days        Hosie Poisson, MD [facility]   To contact the attending provider between 7A-7P or the covering provider during after hours 7P-7A, please log into the web site [email or web address] and access using universal [place] password for that web site. If you do not have the password, please call the hospital operator.  05/04/2021, 1:01 PM

## 2021-05-04 NOTE — Progress Notes (Signed)
[employer] [facility]) Hospital Liaison Note   Current palliative patient. Notified that family may potentially want home with hospice. [facility] liaison sent secure chat message to [facility]. Awaiting to hear back before proceeding. Please call with any questions or concerns.   Buck Mam [facility] Liaison [phone]

## 2021-05-04 NOTE — Progress Notes (Signed)
[facility]) Hospital Liaison note:  Notified by Marianna Fuss of request for [facility] Palliative Care services. Will continue to follow for disposition.  Please call with any outpatient palliative questions or concerns.  Thank you for the opportunity to participate in this patient's care.  Thank you, Lorelee Market, LPN [facility] Liaison [phone]

## 2021-05-04 NOTE — Progress Notes (Signed)
Daily Progress Note   Patient Name: Christopher Carrillo       Date: 05/04/2021 DOB: 1970/04/24  Age: 51 y.o. MRN#: [id number] Attending Physician: Hosie Poisson, MD Primary Care Physician: Susy Frizzle, MD Admit Date: 05/01/2021    HPI/Patient Profile: 51 y.o. male  with past medical history of alcoholic cirrhosis, end-stage liver disease and recent alcoholic hepatitis presented to the ED on 05/01/20 from home - he has regular blood work completed with GI and due to abnormal lab values, worsening creatinine, he was sent to the ED. Patient was admitted on 05/01/2021 with AKI, ETOH abuse, hepatorenal syndrome, alcoholic cirrhosis. Nephrology and GI were consulted and both feel due to his disease progression, not responding to steroids, and ongoing ETOH abuse, his prognosis is likely poor. Noted his MELD-Na score is 36.  Subjective: Chart reviewed. Note that creatinine has decreased to 1.21 today.   I met at bedside with patient and his wife Christopher Carrillo at bedside. He is alert but intermittently closes his eyes and starts to fall asleep requiring Christopher Carrillo to wake him back up throughout our discussion.  Christopher Carrillo shares that she is returning to work next week, and is concerned because Harrie Jeans will be at home all day alone. She also shares that she has had positive experiences with hospice in the past. Christopher Carrillo and Pella both verbalize understanding that he has a non-curable condition and is at high risk for rehospitalization. Discussed that the fluid in his abdomen will likely be a recurrent issue.   Hospice and Palliative Care services outpatient were reviewed. Discussion on the difference between Palliative and Hospice care was had per patient and family request. Palliative care and hospice have similar goals of  managing symptoms, promoting comfort, improving quality of life, and maintaining a person's dignity. However, palliative care may be offered during any phase of a serious illness, while hospice care is usually offered when a person is expected to live for 6 months or less. Also emphasized that outpatient palliative provides minimal "hands-on" care, but  is more of a periodic "check-in" to continue [place] discussion.  Provided education and counseling at length on the philosophy and benefits of hospice care. Discussed that it offers a holistic approach to care in the setting of end-stage illness/disease, and is about supporting the patient where they are while allowing the  natural course to occur. Hospice can help a patient feel as good as possible for as long as possible. Discussed the hospice team includes RNs, physicians, social workers, and chaplains. They can provide personal care, support for the family, and help keep patient out of the hospital. Harrie Jeans and [place] verbalize understanding of this information and will consider.    I was later notified by [facility] team that patient and wife had decided on home with hospice.    Objective:  Physical Exam Vitals reviewed.  Constitutional:      General: He is not in acute distress.    Appearance: He is ill-appearing.  Pulmonary:     Effort: Pulmonary effort is normal.  Abdominal:     General: There is distension.  Skin:    Coloration: Skin is jaundiced.  Neurological:     Mental Status: He is alert and oriented to person, place, and time.            Vital Signs: BP 132/75 (BP Location: Left Arm)    Pulse 81    Temp 97.7 F (36.5 C) (Oral)    Resp 20    Ht 5' 8"  (1.727 m)    Wt 84.2 kg    SpO2 99%    BMI 28.24 kg/m  SpO2: SpO2: 99 % O2 Device: O2 Device: Room Air O2 Flow Rate:     LBM: Last BM Date: 05/04/21 Baseline Weight: Weight: 84.2 kg Most recent weight: Weight: 84.2 kg       Palliative Assessment/Data: PPS  50-60%       Palliative Care Assessment & Plan   Assessment: AKI Alcoholic hepatitis Decompensated alcoholic cirrhosis Hepatorenal syndrome ETOH abuse Malnutrition of moderate degree Concern about end of life  Recommendations/Plan: Continue current care Plan to discharge home with hospice when medically stable PMT will continue to follow   Goals of Care and Additional Recommendations: Limitations on Scope of Treatment: Avoid Hospitalization  Code Status: DNR/DNI  Prognosis:  < 6 months  Discharge Planning: Home with Hospice    Thank you for allowing the Palliative Medicine Team to assist in the care of this patient.  MDM - High due to: 1 or more chronic illnesses with severe exacerbation, progression, or side effects of treatment OR acute or chronic illness or injury that poses a threat to life or bodily function Review of prior external notes, review of test results, assessment requiring an independent historian Discussion or decision not to resuscitate or to de-escalate care because poor prognosis   Lavena Bullion, NP  Please contact Palliative Medicine Team phone at [phone] for questions and concerns.

## 2021-05-04 NOTE — Progress Notes (Addendum)
[facility]) Hospital Liaison Note   Received request from Transitions of Care Manager, Hassan Rowan, for hospice services at home after discharge. Chart and patient information under review by [facility] physician. Hospice eligibility approved.   Spoke with Marita Kansas to initiate education related to hospice philosophy, services, and team approach to care. Marita Kansas verbalized understanding of information given. Per discussion, the plan is for patient to discharge home via TBD once cleared to DC.    DME needs discussed. Patient has the following equipment in the home (Purchased privately): none Patient requests the following equipment for delivery: none  Address verified and is correct in the chart.   Please send signed and completed DNR home with patient/family. Please provide prescriptions at discharge as needed to ensure ongoing symptom management.    [facility] information and contact numbers given to family & above information shared with TOC.   Please call with any questions/concerns.    Thank you for the opportunity to participate in this patient's care.   Daphene Calamity, MSW [facility] Liaison  [phone]

## 2021-05-05 ENCOUNTER — Other Ambulatory Visit (INDEPENDENT_AMBULATORY_CARE_PROVIDER_SITE_OTHER): Payer: Self-pay | Admitting: Internal Medicine

## 2021-05-05 ENCOUNTER — Telehealth: Payer: Self-pay

## 2021-05-05 DIAGNOSIS — N179 Acute kidney failure, unspecified: Secondary | ICD-10-CM | POA: Diagnosis not present

## 2021-05-05 DIAGNOSIS — K7031 Alcoholic cirrhosis of liver with ascites: Secondary | ICD-10-CM | POA: Diagnosis not present

## 2021-05-05 DIAGNOSIS — F1010 Alcohol abuse, uncomplicated: Secondary | ICD-10-CM | POA: Diagnosis not present

## 2021-05-05 DIAGNOSIS — K767 Hepatorenal syndrome: Secondary | ICD-10-CM | POA: Diagnosis not present

## 2021-05-05 LAB — PROTIME-INR
INR: 1.7 — ABNORMAL HIGH (ref 0.8–1.2)
Prothrombin Time: 20.2 seconds — ABNORMAL HIGH (ref 11.4–15.2)

## 2021-05-05 LAB — CBC WITH DIFFERENTIAL/PLATELET
Abs Immature Granulocytes: 0.15 10*3/uL — ABNORMAL HIGH (ref 0.00–0.07)
Basophils Absolute: 0 10*3/uL (ref 0.0–0.1)
Basophils Relative: 0 %
Eosinophils Absolute: 0 10*3/uL (ref 0.0–0.5)
Eosinophils Relative: 0 %
HCT: 25.9 % — ABNORMAL LOW (ref 39.0–52.0)
Hemoglobin: 9.3 g/dL — ABNORMAL LOW (ref 13.0–17.0)
Immature Granulocytes: 1 %
Lymphocytes Relative: 4 %
Lymphs Abs: 0.6 10*3/uL — ABNORMAL LOW (ref 0.7–4.0)
MCH: 35.4 pg — ABNORMAL HIGH (ref 26.0–34.0)
MCHC: 35.9 g/dL (ref 30.0–36.0)
MCV: 98.5 fL (ref 80.0–100.0)
Monocytes Absolute: 1.2 10*3/uL — ABNORMAL HIGH (ref 0.1–1.0)
Monocytes Relative: 9 %
Neutro Abs: 11.2 10*3/uL — ABNORMAL HIGH (ref 1.7–7.7)
Neutrophils Relative %: 86 %
Platelets: 181 10*3/uL (ref 150–400)
RBC: 2.63 MIL/uL — ABNORMAL LOW (ref 4.22–5.81)
RDW: 16.3 % — ABNORMAL HIGH (ref 11.5–15.5)
WBC: 13.1 10*3/uL — ABNORMAL HIGH (ref 4.0–10.5)
nRBC: 0 % (ref 0.0–0.2)

## 2021-05-05 LAB — COMPREHENSIVE METABOLIC PANEL
ALT: 57 U/L — ABNORMAL HIGH (ref 0–44)
AST: 73 U/L — ABNORMAL HIGH (ref 15–41)
Albumin: 3.7 g/dL (ref 3.5–5.0)
Alkaline Phosphatase: 154 U/L — ABNORMAL HIGH (ref 38–126)
Anion gap: 10 (ref 5–15)
BUN: 35 mg/dL — ABNORMAL HIGH (ref 6–20)
CO2: 15 mmol/L — ABNORMAL LOW (ref 22–32)
Calcium: 8.4 mg/dL — ABNORMAL LOW (ref 8.9–10.3)
Chloride: 112 mmol/L — ABNORMAL HIGH (ref 98–111)
Creatinine, Ser: 2.27 mg/dL — ABNORMAL HIGH (ref 0.61–1.24)
GFR, Estimated: 34 mL/min — ABNORMAL LOW (ref >60)
Glucose, Bld: 182 mg/dL — ABNORMAL HIGH (ref 70–99)
Potassium: 3.4 mmol/L — ABNORMAL LOW (ref 3.5–5.1)
Sodium: 137 mmol/L (ref 135–145)
Total Bilirubin: 16.3 mg/dL — ABNORMAL HIGH (ref 0.3–1.2)
Total Protein: 6.3 g/dL — ABNORMAL LOW (ref 6.5–8.1)

## 2021-05-05 LAB — MAGNESIUM: Magnesium: 2 mg/dL (ref 1.7–2.4)

## 2021-05-05 MED ORDER — PANTOPRAZOLE SODIUM 40 MG PO TBEC: 40.0000 mg | DELAYED_RELEASE_TABLET | Freq: Two times a day (BID) | ORAL | 2 refills | Status: DC

## 2021-05-05 MED ORDER — PREDNISONE 20 MG PO TABS: 40.0000 mg | ORAL_TABLET | Freq: Every day | ORAL | 0 refills | Status: AC

## 2021-05-05 MED ORDER — PANTOPRAZOLE SODIUM 40 MG PO TBEC: 40.0000 mg | DELAYED_RELEASE_TABLET | Freq: Two times a day (BID) | ORAL | Status: DC

## 2021-05-05 MED ORDER — ENSURE ENLIVE PO LIQD: 237.0000 mL | Freq: Three times a day (TID) | ORAL | 12 refills | Status: AC

## 2021-05-05 MED ORDER — POTASSIUM CHLORIDE CRYS ER 20 MEQ PO TBCR
40.0000 meq | EXTENDED_RELEASE_TABLET | Freq: Once | ORAL | Status: AC
  Administered 2021-05-05: 40 meq via ORAL
  Filled 2021-05-05: qty 2

## 2021-05-05 MED ORDER — SODIUM BICARBONATE 650 MG PO TABS: 1300.0000 mg | ORAL_TABLET | Freq: Two times a day (BID) | ORAL | 0 refills | Status: DC

## 2021-05-05 MED ORDER — LACTULOSE 10 GM/15ML PO SOLN: 20.0000 g | Freq: Every day | ORAL | 2 refills | Status: DC

## 2021-05-05 NOTE — Progress Notes (Signed)
Subjective:  UOP not recorded-  it actually wasn't recorded yesterday either-  I misread-    crt stable at 2.2-  BP good-    Objective Vital signs in last 24 hours: Vitals:   05/04/21 1100 05/04/21 1634 05/04/21 2028 05/05/21 0639  BP: 132/75 127/82 129/78 127/76  Pulse: 81  88 87  Resp: 20   18  Temp: 97.7 F (36.5 C)  97.8 F (36.6 C) 97.8 F (36.6 C)  TempSrc: Oral  Oral Oral  SpO2: 99%   98%  Weight:      Height:       Weight change:   Intake/Output Summary (Last 24 hours) at 05/05/2021 1125 Last data filed at 05/05/2021 0000 Gross per 24 hour  Intake 770.72 ml  Output --  Net 770.72 ml    Assessment/ Plan: Pt is a 51 y.o. yo male with ETOH cirrhosis who was admitted on 05/01/2021 with AKI  Assessment/Plan: 1. Renal-  crt 1.04 on 04/21/21 and then above 3-  was 3.7 on 1/20, improved to 3.13 and 3.4 yest but now a steady improvement now at 2.2 -  non oliguric-  urine bland-  seems to be consistent with HRS.  Has been started on scheduled albumin, octreotide-  also midodrine.  He is acidotic - started  bicarb-  hyponatremia is not unusual in this scenario but has improved as well.   HD in these cases is tough-  it is not usually recommended because the prognosis is so poor -  we will hope for some stability which we have achieved.  Now the plan is for him to go home with hospice-  can keep sodium bicarb going as OP but stop the other meds for HRS 2.cirrhosis-  GI on board-  lactulose and being treated as well for SBP-  considering diagnostic paracentesis, done-  is  now DNR after discussion with palliative care and actually going to be going home with hospice-  felt to have poor prognosis  3. Anemia-  not a major issue at this time  4. HTN/volume- appears to have ascites-  has little peripheral edema-  giving albumin -  no lasix-   doing well with that however-  can use diuretics PRN as OP   Pt being discharged today-   home with hospice-  his AKI has improved but not back to  normal-  is at risk for having recurrence.  Unclear what regimen regarding labs will be at home-  can argue to not really check-   have not arranged specific renal follow up BUT if felt to be needed from a hospice point of view just contact our office   [place]: Basic Metabolic Panel: Recent Labs  Lab 05/03/21 0337 05/04/21 0643 05/05/21 0126  NA 125* 134* 137  K 3.4* 3.0* 3.4*  CL 100 109 112*  CO2 15* 17* 15*  GLUCOSE 119* 135* 182*  BUN 52* 41* 35*  CREATININE 2.84* 2.21* 2.27*  CALCIUM 8.4* 8.4* 8.4*   Liver Function Tests: Recent Labs  Lab 05/03/21 0337 05/04/21 0643 05/05/21 0126  AST 79* 84* 73*  ALT 45* 56* 57*  ALKPHOS 192* 177* 154*  BILITOT 24.8* 19.7* 16.3*  PROT 6.0* 6.3* 6.3*  ALBUMIN 2.9* 3.4* 3.7   Recent Labs  Lab 05/01/21 1450  LIPASE 134*   No results for input(s): AMMONIA in the last 168 hours. CBC: Recent Labs  Lab 05/01/21 1450 05/02/21 0358 05/03/21 5956 05/04/21 3875 05/05/21  0126  WBC 17.4* 16.1* 9.1 10.7* 13.1*  NEUTROABS  --   --  7.5 8.8* 11.2*  HGB 13.8 12.0* 10.8* 10.3* 9.3*  HCT 37.1* 33.6* 28.9* 28.4* 25.9*  MCV 95.6 96.0 95.7 97.9 98.5  PLT 218 185 152 175 181   Cardiac Enzymes: No results for input(s): CKTOTAL, CKMB, CKMBINDEX, TROPONINI in the last 168 hours. CBG: No results for input(s): GLUCAP in the last 168 hours.  Iron Studies: No results for input(s): IRON, TIBC, TRANSFERRIN, FERRITIN in the last 72 hours. Studies/Results: CT ABDOMEN PELVIS WO CONTRAST  Result Date: 05/04/2021 CLINICAL DATA:  Left lower quadrant pain. EXAM: CT ABDOMEN AND PELVIS WITHOUT CONTRAST TECHNIQUE: Multidetector CT imaging of the abdomen and pelvis was performed following the standard protocol without IV contrast. RADIATION DOSE REDUCTION: This exam was performed according to the departmental dose-optimization program which includes automated exposure control, adjustment of the mA and/or kV according to patient size  and/or use of iterative reconstruction technique. COMPARISON:  CT with IV contrast 09/28/2020, 08/12/2019 FINDINGS: Lower chest: No acute abnormality. There is calcification in the right coronary artery. Normal cardiac size. Hepatobiliary: The liver is 16.3 cm length mildly steatotic and there are morphologic changes of hepatic cirrhosis chronically seen. The hepatic portal main vein is upper normal in caliber. No focal lesion is seen in the liver without contrast. The gallbladder wall again is slightly thickened. There is layering density not seen previously which could be dense sludge or gravel new in the interval. Pancreas: Unremarkable without contrast.  No adjacent edema. Spleen: Mildly enlarged, 13.5 cm transverse, stable. Adrenals/Urinary Tract: There is no adrenal mass no focal abnormality in the unenhanced renal cortex and no stones or hydronephrosis either side. The bladder thickness mildly prominent but the bladder is not fully distended. Stomach/Bowel: There is diffuse gastric fold thickening consistent with gastritis or portal gastropathy. Thickened folds involve multiple left abdominal small bowel segments as well. No small bowel dilatation or inflammation is seen. There is wall thickening or underdistention of the ascending colon, remainder of the large intestine walls normal thickness with scattered uncomplicated sigmoid diverticula. Appendix is unremarkable. Vascular/Lymphatic: Mild patchy aortoiliac calcific disease. No AAA. Shotty subcentimeter up to borderline size retroperitoneal nodes are unchanged. No inguinal or pelvic adenopathy. There are relatively small perigastric and omental varices. There are no large varices. Reproductive: No prostatomegaly. Other: There mesenteric congestive features and mild abdominopelvic free ascites with scattered fluid in the mesenteric folds. The deepest pocket is within the anterior right iliac fossa measuring 4.1 cm AP. Others would not be drainable. There is  no free air, hemorrhage or abscess. There are small umbilical and inguinal fat hernias. Musculoskeletal: Mild degenerative change lumbar spine. No worrisome regional skeletal lesion. There are mild features of body wall anasarca increased over prior study. IMPRESSION: 1. Cirrhosis, mesenteric congestive changes and increased mild abdominopelvic free ascites. 2. Mild splenomegaly and upper limit of normal portal vein caliber with small upper abdominal varices. 3. Ascending colitis versus changes of portal hypertension with portal colopathy. 4. Gastroenteritis versus changes of portal hypertension or reactive wall thickening from the ascites. 5. Increased changes of body wall anasarca. 6. Moderately dense layering material newly noted in the gallbladder lumen which could be layering dense sludge or gravel , not seen previously. 7. The gallbladder wall slightly thickened, which was seen previously and is probably due to hepatic dysfunction. Correlate clinically for early cholecystitis. 8. Cystitis versus bladder nondistention. 9. Aortic atherosclerosis. Electronically Signed   By: Telford Nab  M.D.   On: 05/04/2021 06:04   Medications: Infusions:  albumin human 25 g (05/05/21 0435)   octreotide  (SANDOSTATIN)    IV infusion 50 mcg/hr (05/05/21 0430)    Scheduled Medications:  feeding supplement  237 mL Oral TID BM   folic acid  1 mg Oral Daily   heparin  5,000 Units Subcutaneous Q8H   lactulose  10 g Oral Daily   midodrine  10 mg Oral BID WC   multivitamin with minerals  1 tablet Oral Daily   pantoprazole (PROTONIX) IV  40 mg Intravenous Q12H   predniSONE  40 mg Oral Q breakfast   rifaximin  550 mg Oral BID   sodium bicarbonate  1,300 mg Oral BID   thiamine  100 mg Oral Daily   Or   thiamine  100 mg Intravenous Daily    have reviewed scheduled and prn medications.  Physical Exam: General: soft spoken-  no complaints-  jaundiced-  resting -  wife at bedside  Heart: RRR Lungs: mostly  clear Abdomen: distended-  fluid wave Extremities: trace peripheral edema     05/05/2021,11:25 AM  LOS: 4 days

## 2021-05-05 NOTE — Telephone Encounter (Signed)
Kristie with [facility] called in about this pt being d/c from hospital with a referral for hospice. Drue Dun would like to know if pcp agrees with referral and if pcp would serve as attending. Please advise.  Cb#: Drue Dun with [facility] at [phone]

## 2021-05-05 NOTE — Progress Notes (Signed)
[place] ([facility])    Please send signed and completed DNR with patient/family upon discharge. Please provide prescriptions at discharge as needed to ensure ongoing symptom management and a transport packet.   [facility] information and contact numbers given to family and above information shared with TOC.    Please call with any questions/concerns.    Thank you for the opportunity to participate in this patient's care   Daphene Calamity, MSW [facility] Liaison  [phone]

## 2021-05-05 NOTE — Progress Notes (Addendum)
Progress Note   Subjective  Chief Complaint: AKI likely HRS with decompensated cirrhosis  Wife at bedside. Loose bowel movement this morning, brown. . Continue to have AB pain, diffuse. Denies fever, chills, nausea, vomiting. Wife and patient have talked with palliative care/hospice and intending to go home with hospice.    Objective   Vital signs in last 24 hours: Temp:  [97.7 F (36.5 C)-97.8 F (36.6 C)] 97.8 F (36.6 C) (01/27 0639) Pulse Rate:  [81-88] 87 (01/27 0639) Resp:  [18-20] 18 (01/27 0639) BP: (127-132)/(75-82) 127/76 (01/27 0639) SpO2:  [98 %-99 %] 98 % (01/27 0639) Last BM Date: 05/04/21  General:   Alert, well developed male in no acute distress Eyes: scleral icterus,conjunctive icteric  Heart:  regular rate and rhythm, no murmurs or gallops Pulm: Clear anteriorly; no wheezing Abdomen:  Soft, Obese AB, skin exam normal, Normal bowel sounds. mild tenderness in the entire abdomen.  Worse LLQ with ecchymosis, no  fluid wave,  dullness in flanks   Extremities:  Without edema. Neurologic:  Alert and  oriented x4;  grossly normal neurologically. without asterixis or clonus.  Skin:  with jaundice, lower AB ecchymosis. no palmar erythema or spider angioma.   Psychiatric: Demonstrates good judgement and reason without abnormal affect or behaviors.  Intake/Output from previous day: 01/26 0701 - 01/27 0700 In: 1490.7 [P.O.:960; I.V.:289.6; IV Piggyback:241.2] Out: -  Intake/Output this shift: No intake/output data recorded.  Lab Results: Recent Labs    05/03/21 0337 05/04/21 0643 05/05/21 0126  WBC 9.1 10.7* 13.1*  HGB 10.8* 10.3* 9.3*  HCT 28.9* 28.4* 25.9*  PLT 152 175 181   BMET Recent Labs    05/03/21 0337 05/04/21 0643 05/05/21 0126  NA 125* 134* 137  K 3.4* 3.0* 3.4*  CL 100 109 112*  CO2 15* 17* 15*  GLUCOSE 119* 135* 182*  BUN 52* 41* 35*  CREATININE 2.84* 2.21* 2.27*  CALCIUM 8.4* 8.4* 8.4*   LFT Recent Labs    05/05/21 0126   PROT 6.3*  ALBUMIN 3.7  AST 73*  ALT 57*  ALKPHOS 154*  BILITOT 16.3*   PT/INR Recent Labs    05/02/21 1235 05/05/21 0126  LABPROT 18.8* 20.2*  INR 1.6* 1.7*    Studies/Results: CT ABDOMEN PELVIS WO CONTRAST  Result Date: 05/04/2021 CLINICAL DATA:  Left lower quadrant pain. EXAM: CT ABDOMEN AND PELVIS WITHOUT CONTRAST TECHNIQUE: Multidetector CT imaging of the abdomen and pelvis was performed following the standard protocol without IV contrast. RADIATION DOSE REDUCTION: This exam was performed according to the departmental dose-optimization program which includes automated exposure control, adjustment of the mA and/or kV according to patient size and/or use of iterative reconstruction technique. COMPARISON:  CT with IV contrast 09/28/2020, 08/12/2019 FINDINGS: Lower chest: No acute abnormality. There is calcification in the right coronary artery. Normal cardiac size. Hepatobiliary: The liver is 16.3 cm length mildly steatotic and there are morphologic changes of hepatic cirrhosis chronically seen. The hepatic portal main vein is upper normal in caliber. No focal lesion is seen in the liver without contrast. The gallbladder wall again is slightly thickened. There is layering density not seen previously which could be dense sludge or gravel new in the interval. Pancreas: Unremarkable without contrast.  No adjacent edema. Spleen: Mildly enlarged, 13.5 cm transverse, stable. Adrenals/Urinary Tract: There is no adrenal mass no focal abnormality in the unenhanced renal cortex and no stones or hydronephrosis either side. The bladder thickness mildly prominent but the bladder is not fully  distended. Stomach/Bowel: There is diffuse gastric fold thickening consistent with gastritis or portal gastropathy. Thickened folds involve multiple left abdominal small bowel segments as well. No small bowel dilatation or inflammation is seen. There is wall thickening or underdistention of the ascending colon,  remainder of the large intestine walls normal thickness with scattered uncomplicated sigmoid diverticula. Appendix is unremarkable. Vascular/Lymphatic: Mild patchy aortoiliac calcific disease. No AAA. Shotty subcentimeter up to borderline size retroperitoneal nodes are unchanged. No inguinal or pelvic adenopathy. There are relatively small perigastric and omental varices. There are no large varices. Reproductive: No prostatomegaly. Other: There mesenteric congestive features and mild abdominopelvic free ascites with scattered fluid in the mesenteric folds. The deepest pocket is within the anterior right iliac fossa measuring 4.1 cm AP. Others would not be drainable. There is no free air, hemorrhage or abscess. There are small umbilical and inguinal fat hernias. Musculoskeletal: Mild degenerative change lumbar spine. No worrisome regional skeletal lesion. There are mild features of body wall anasarca increased over prior study. IMPRESSION: 1. Cirrhosis, mesenteric congestive changes and increased mild abdominopelvic free ascites. 2. Mild splenomegaly and upper limit of normal portal vein caliber with small upper abdominal varices. 3. Ascending colitis versus changes of portal hypertension with portal colopathy. 4. Gastroenteritis versus changes of portal hypertension or reactive wall thickening from the ascites. 5. Increased changes of body wall anasarca. 6. Moderately dense layering material newly noted in the gallbladder lumen which could be layering dense sludge or gravel , not seen previously. 7. The gallbladder wall slightly thickened, which was seen previously and is probably due to hepatic dysfunction. Correlate clinically for early cholecystitis. 8. Cystitis versus bladder nondistention. 9. Aortic atherosclerosis. Electronically Signed   By: Telford Nab M.D.   On: 05/04/2021 06:04      Impression/Plan:   Decompensated cirrhosis secondary continuing ETOH use with history of hepatic encephalopathy and  ascites WBC 13.1 HGB 9.3 MCV 98.5 Platelets 181 AST 73 ALT 57  Alkphos 154 TBili 16.3 GFR 34  INR 05/05/2021 1.7  MELD-Na score: 31   Discriminate Score 52.8, on prednisone 40 mg daily, will calculate Lille score day 7 (01/31), can continue outpatient as patient seems to symptomatically be better with it.  Discussed while labs may seem to be improving, continues to have very high mortality rate and very poor prognosis Palliative has been consulted- plan to go home with hospice Patient now DNR/DNI Patient has follow up with Dr. Tarri Glenn in Feb, can keep this appointment if they wish.    Ascites S/p diagnostic paracentesis Negative for SBP, gram stain neg  AB pain No SBP CT AB with diffuse portal hypertension and ascites, has new gallbladder wall thickening versus from ascites with sludge versus gravel LFTs down trending, WBC was improving, no Fever, chills, no significant RUQ AB pain clinical picture does not match cholecystitis, can consider RUQ if things change.  Esophageal Varices no evidence last EGD : Last EGD Dec 2022 without evidence of varices, showed portal gastropathy and mild duodenitis. Have melena/hemtochezia x 1 week without significant drop in H/H Low yield on repeat endoscopy. Monitor H/H transfuse to keep around 7 Suggest continuing outpatient Protonix 40 mg BID   Acute renal failure possibly due to hepatorenal syndrome - stable at this time -Avoid large volume paracentesis -Diuretics on hold, DC antihypertensives- do not restart outpatient at this time --Avoid nephrotoxic drugs - appreciate nephrology input   Alcohol abuse -Alcohol Abstinence counseling discussed with patient as continued use is strongly associated with worsening liver  disease progression  --No role for liver transplantation given ongoing alcohol use   Hepatic Encephalopathy:  continue meds outpatient - Lactulose, 82m titrate to 3bms per day - Rifaximin 5534mbid - minimize/remove all  benzos and narcotics   With history of coagulopathy present INR 1.7  Future Appointments  Date Time Provider [place]2/21/2023  3:00 PM [place]ornton ParkMD [facility]      LOS: 4 days   AmVladimir Crofts1/27/2023, 9:29 AM

## 2021-05-05 NOTE — Telephone Encounter (Signed)
Drue Dun called back to follow up; [facility] would like to see the patient this evening. Requesting call back before the close of business today. Please advise at [phone].

## 2021-05-05 NOTE — Telephone Encounter (Signed)
Spoke with [facility] and advised of approval to be attending.

## 2021-05-05 NOTE — Consult Note (Signed)
° °  [facility] CM Inpatient Consult   05/05/2021  [name] 1970-04-29 [id number]  [facility] [[facility]] Patient: Christopher Carrillo Medicare  Primary Care Provider:  Susy Frizzle, MD, [place] is an embedded provider with a Chronic Care Management team and program, and is listed for the transition of care follow up and appointments.  Patient was screened for Embedded practice service needs for chronic care management for less than 30 days readmission noted. Plan:  Patient will be followed by [facility] team for post hospital needs.  No Embedded CCM program referral requested at this time.  Please contact for further questions,  Natividad Brood, RN BSN [facility] Liaison  [phone] business mobile phone Toll free office [phone]  Fax number: [phone] [email or web address]

## 2021-05-06 DIAGNOSIS — Z720 Tobacco use: Secondary | ICD-10-CM | POA: Diagnosis not present

## 2021-05-06 DIAGNOSIS — K7010 Alcoholic hepatitis without ascites: Secondary | ICD-10-CM

## 2021-05-06 DIAGNOSIS — R69 Illness, unspecified: Secondary | ICD-10-CM | POA: Diagnosis not present

## 2021-05-06 DIAGNOSIS — U071 COVID-19: Secondary | ICD-10-CM | POA: Diagnosis not present

## 2021-05-06 DIAGNOSIS — R17 Unspecified jaundice: Secondary | ICD-10-CM | POA: Diagnosis not present

## 2021-05-07 LAB — CULTURE, BODY FLUID W GRAM STAIN -BOTTLE: Culture: NO GROWTH

## 2021-05-07 NOTE — Discharge Summary (Signed)
Physician Discharge Summary  Alvia Tory SAY:[id number] DOB: 11/08/1970 DOA: 05/01/2021  PCP: Susy Frizzle, MD  Admit date: 05/01/2021 Discharge date: 05/05/2021  Admitted From: home.  Disposition:  Home with hospice.   Recommendations for Outpatient Follow-up:  Follow up with PCP in 1-2 weeks Please obtain BMP/CBC in one week Please follow up with GI/ Nephrology as needed.   Discharge Condition:Hospice.  CODE STATUS:hospice.  Diet recommendation: Heart Healthy  Brief/Interim Summary:  51 year old with history of alcoholic cirrhosis, end-stage liver disease and recent alcoholic hepatitis who was following up with GI as outpatient, recent worsening creatinine so sent to ER. Patient reports diffuse abdominal pain, generalized weakness and intermittent dizziness. Pt underwent paracentesis on 05/02/21  and 500 ml removed.  Negative for SBP, gram stain is negative. Gastroenterology and nephrology on board and following.  Pt currently on octreotide gtt, with discriminate score of 52.8 and on prednisone 40 mg daily. Patient has poor prognosis and has a very high morality rate. Meanwhile palliative care consulted but patient does not want to make any decisions at this time, he would like to to go home with hospice.   Discharge Diagnoses:  Principal Problem:   Acute kidney failure ([place]) Active Problems:   Alcoholic hepatitis   Alcoholic cirrhosis ([place])   Hepatorenal syndrome ([facility])   ETOH abuse   Malnutrition of moderate degree   Ascites   Decompensated cirrhosis secondary to continuous EtOH use Hepatic encephalopathy with ascites S/p diagnostic paracentesis on 05/02/21, no signs of SBP. Antibiotics discontinues.  No role for liver transplantation given ongoing alcohol use     Mild generalized abdominal pain Intermittent , most with eating and associated with some nausea.  CT abd and pelvis without contrast reviewed.  No indication for antibiotics.      Esophageal  varices Continue with Protonix twice daily    AKI secondary to hepatorenal syndrome Avoid large volume paracentesis, diuretics on hold. Nephrology on board and appreciate recommendations. Improving renal parameters. Bicarb improving .  Continue with bicarb tablets.       History of hepatic encephalopathy Continue with lactulose and rifaximin.   coagulopathy Continue with PT/INR monitoring.      Hypokalemia Replaced,.  In view of poor prognosis, palliative care consulted , pt and family decided to go home with hospice.    Discharge Instructions  Discharge Instructions     Diet - low sodium heart healthy   Complete by: As directed    Discharge instructions   Complete by: As directed    Please follow up with Hospice MD as needed.      Allergies as of 05/05/2021       Reactions   Oxycodone Itching        Medication List     TAKE these medications    feeding supplement Liqd Take 237 mLs by mouth 3 (three) times daily between meals.   folic acid 1 MG tablet Commonly known as: FOLVITE Take 1 tablet (1 mg total) by mouth daily.   lactulose 10 GM/15ML solution Commonly known as: CHRONULAC Take 30 mLs (20 g total) by mouth daily. What changed: how much to take   multivitamin with minerals tablet Take 1 tablet by mouth daily.   ondansetron 4 MG tablet Commonly known as: ZOFRAN Take 1 tablet (4 mg total) by mouth every 6 (six) hours as needed for nausea.   pantoprazole 40 MG tablet Commonly known as: PROTONIX Take 1 tablet (40 mg total) by mouth 2 (two) times daily before a  meal.   potassium chloride SA 20 MEQ tablet Commonly known as: KLOR-CON M Take 1 tablet (20 mEq total) by mouth daily.   predniSONE 20 MG tablet Commonly known as: DELTASONE Take 2 tablets (40 mg total) by mouth daily with breakfast for 24 days.   rifaximin 550 MG Tabs tablet Commonly known as: XIFAXAN Take 1 tablet (550 mg total) by mouth 2 (two) times daily.   sodium  bicarbonate 650 MG tablet Take 2 tablets (1,300 mg total) by mouth 2 (two) times daily.   thiamine 100 MG tablet Take 1 tablet (100 mg total) by mouth daily.   traMADol 50 MG tablet Commonly known as: ULTRAM Take 1 tablet (50 mg total) by mouth every 6 (six) hours as needed. What changed: reasons to take this        Follow-up Information     [facility] up.   Specialty: PALLIATIVE CARE Why: Hospice Services-Office to contact family with a visit time. Contact information: [facility] [phone]               Allergies  Allergen Reactions   Oxycodone Itching    Consultations: Nephrology Palliative care  Gastroenterology   Procedures/Studies: CT ABDOMEN PELVIS WO CONTRAST  Result Date: 05/04/2021 CLINICAL DATA:  Left lower quadrant pain. EXAM: CT ABDOMEN AND PELVIS WITHOUT CONTRAST TECHNIQUE: Multidetector CT imaging of the abdomen and pelvis was performed following the standard protocol without IV contrast. RADIATION DOSE REDUCTION: This exam was performed according to the departmental dose-optimization program which includes automated exposure control, adjustment of the mA and/or kV according to patient size and/or use of iterative reconstruction technique. COMPARISON:  CT with IV contrast 09/28/2020, 08/12/2019 FINDINGS: Lower chest: No acute abnormality. There is calcification in the right coronary artery. Normal cardiac size. Hepatobiliary: The liver is 16.3 cm length mildly steatotic and there are morphologic changes of hepatic cirrhosis chronically seen. The hepatic portal main vein is upper normal in caliber. No focal lesion is seen in the liver without contrast. The gallbladder wall again is slightly thickened. There is layering density not seen previously which could be dense sludge or gravel new in the interval. Pancreas: Unremarkable without contrast.  No adjacent edema. Spleen: Mildly enlarged, 13.5 cm  transverse, stable. Adrenals/Urinary Tract: There is no adrenal mass no focal abnormality in the unenhanced renal cortex and no stones or hydronephrosis either side. The bladder thickness mildly prominent but the bladder is not fully distended. Stomach/Bowel: There is diffuse gastric fold thickening consistent with gastritis or portal gastropathy. Thickened folds involve multiple left abdominal small bowel segments as well. No small bowel dilatation or inflammation is seen. There is wall thickening or underdistention of the ascending colon, remainder of the large intestine walls normal thickness with scattered uncomplicated sigmoid diverticula. Appendix is unremarkable. Vascular/Lymphatic: Mild patchy aortoiliac calcific disease. No AAA. Shotty subcentimeter up to borderline size retroperitoneal nodes are unchanged. No inguinal or pelvic adenopathy. There are relatively small perigastric and omental varices. There are no large varices. Reproductive: No prostatomegaly. Other: There mesenteric congestive features and mild abdominopelvic free ascites with scattered fluid in the mesenteric folds. The deepest pocket is within the anterior right iliac fossa measuring 4.1 cm AP. Others would not be drainable. There is no free air, hemorrhage or abscess. There are small umbilical and inguinal fat hernias. Musculoskeletal: Mild degenerative change lumbar spine. No worrisome regional skeletal lesion. There are mild features of body wall anasarca increased over prior study. IMPRESSION: 1. Cirrhosis,  mesenteric congestive changes and increased mild abdominopelvic free ascites. 2. Mild splenomegaly and upper limit of normal portal vein caliber with small upper abdominal varices. 3. Ascending colitis versus changes of portal hypertension with portal colopathy. 4. Gastroenteritis versus changes of portal hypertension or reactive wall thickening from the ascites. 5. Increased changes of body wall anasarca. 6. Moderately dense  layering material newly noted in the gallbladder lumen which could be layering dense sludge or gravel , not seen previously. 7. The gallbladder wall slightly thickened, which was seen previously and is probably due to hepatic dysfunction. Correlate clinically for early cholecystitis. 8. Cystitis versus bladder nondistention. 9. Aortic atherosclerosis. Electronically Signed   By: Telford Nab M.D.   On: 05/04/2021 06:04   IR Paracentesis  Result Date: 05/02/2021 INDICATION: Patient with a history of end-stage liver disease presents today with ascites. Interventional radiology asked to perform a diagnostic paracentesis up to 500 mL. EXAM: ULTRASOUND GUIDED PARACENTESIS MEDICATIONS: 1% lidocaine 10 mL COMPLICATIONS: None immediate. PROCEDURE: Informed written consent was obtained from the patient after a discussion of the risks, benefits and alternatives to treatment. A timeout was performed prior to the initiation of the procedure. Initial ultrasound scanning demonstrates a large amount of ascites within the right lower abdominal quadrant. The right lower abdomen was prepped and draped in the usual sterile fashion. 1% lidocaine was used for local anesthesia. Following this, a 19 gauge, 7-cm, Yueh catheter was introduced. An ultrasound image was saved for documentation purposes. The paracentesis was performed. The catheter was removed and a dressing was applied. The patient tolerated the procedure well without immediate post procedural complication. FINDINGS: A total of approximately 500 mL of orange fluid was removed. Samples were sent to the laboratory as requested by the clinical team. IMPRESSION: Successful ultrasound-guided paracentesis yielding 500 mL liters of peritoneal fluid. Read by: Soyla Dryer, NP Electronically Signed   By: Corrie Mckusick D.O.   On: 05/02/2021 15:40      Subjective: No new complaints.   Discharge Exam: Vitals:   05/04/21 2028 05/05/21 0639  BP: 129/78 127/76  Pulse: 88  87  Resp:  18  Temp: 97.8 F (36.6 C) 97.8 F (36.6 C)  SpO2:  98%   Vitals:   05/04/21 1100 05/04/21 1634 05/04/21 2028 05/05/21 0639  BP: 132/75 127/82 129/78 127/76  Pulse: 81  88 87  Resp: 20   18  Temp: 97.7 F (36.5 C)  97.8 F (36.6 C) 97.8 F (36.6 C)  TempSrc: Oral  Oral Oral  SpO2: 99%   98%  Weight:      Height:        General: Pt is alert, awake, not in acute distress Cardiovascular: RRR, S1/S2 +, no rubs, no gallops Respiratory: CTA bilaterally, no wheezing, no rhonchi Abdominal: Soft, NT, ND, bowel sounds + Extremities: no edema, no cyanosis    The results of significant diagnostics from this hospitalization (including imaging, microbiology, ancillary and laboratory) are listed below for reference.     Microbiology: Recent Results (from the past 240 hour(s))  Culture, body fluid w Gram Stain-bottle     Status: None   Collection Time: 05/02/21  3:27 PM   Specimen: Fluid  Result Value Ref Range Status   Specimen Description FLUID  Final   Special Requests PERITONEAL  Final   Culture   Final    NO GROWTH 5 DAYS Performed at [facility], [address], [place], [place] [address]    Report Status 05/07/2021 FINAL  Final  Gram stain     Status: None   Collection Time: 05/02/21  3:27 PM   Specimen: Fluid  Result Value Ref Range Status   Specimen Description FLUID  Final   Special Requests PERITONEAL  Final   Gram Stain   Final    WBC PRESENT,BOTH PMN AND MONONUCLEAR NO ORGANISMS SEEN CYTOSPIN SMEAR Performed at [facility], [address] [place], [place] [address]    Report Status 05/02/2021 FINAL  Final     Labs: BNP (last 3 results) No results for input(s): BNP in the last 8760 hours. Basic Metabolic Panel: Recent Labs  Lab 05/01/21 1450 05/02/21 0358 05/03/21 0337 05/04/21 0643 05/05/21 0126  NA 129* 127* 125* 134* 137  K 3.2* 3.5 3.4* 3.0* 3.4*  CL 103 104 100 109 112*  CO2 15* 12* 15* 17* 15*  GLUCOSE 172* 87  119* 135* 182*  BUN 56* 56* 52* 41* 35*  CREATININE 3.13* 3.40* 2.84* 2.21* 2.27*  CALCIUM 8.3* 8.1* 8.4* 8.4* 8.4*  MG  --   --   --   --  2.0   Liver Function Tests: Recent Labs  Lab 05/01/21 1450 05/02/21 0358 05/03/21 0337 05/04/21 0643 05/05/21 0126  AST 105* 92* 79* 84* 73*  ALT 52* 44 45* 56* 57*  ALKPHOS 282* 225* 192* 177* 154*  BILITOT 29.7* 26.6* 24.8* 19.7* 16.3*  PROT 6.2* 5.4* 6.0* 6.3* 6.3*  ALBUMIN 2.0* 2.2* 2.9* 3.4* 3.7   Recent Labs  Lab 05/01/21 1450  LIPASE 134*   No results for input(s): AMMONIA in the last 168 hours. CBC: Recent Labs  Lab 05/01/21 1450 05/02/21 0358 05/03/21 0337 05/04/21 0643 05/05/21 0126  WBC 17.4* 16.1* 9.1 10.7* 13.1*  NEUTROABS  --   --  7.5 8.8* 11.2*  HGB 13.8 12.0* 10.8* 10.3* 9.3*  HCT 37.1* 33.6* 28.9* 28.4* 25.9*  MCV 95.6 96.0 95.7 97.9 98.5  PLT 218 185 152 175 181   Cardiac Enzymes: No results for input(s): CKTOTAL, CKMB, CKMBINDEX, TROPONINI in the last 168 hours. BNP: Invalid input(s): POCBNP CBG: No results for input(s): GLUCAP in the last 168 hours. D-Dimer No results for input(s): DDIMER in the last 72 hours. Hgb A1c No results for input(s): HGBA1C in the last 72 hours. Lipid Profile No results for input(s): CHOL, HDL, LDLCALC, TRIG, CHOLHDL, LDLDIRECT in the last 72 hours. Thyroid function studies No results for input(s): TSH, T4TOTAL, T3FREE, THYROIDAB in the last 72 hours.  Invalid input(s): FREET3 Anemia work up No results for input(s): VITAMINB12, FOLATE, FERRITIN, TIBC, IRON, RETICCTPCT in the last 72 hours. Urinalysis    Component Value Date/Time   COLORURINE AMBER (A) 05/01/2021 1336   APPEARANCEUR HAZY (A) 05/01/2021 1336   LABSPEC 1.011 05/01/2021 1336   PHURINE 5.0 05/01/2021 1336   GLUCOSEU NEGATIVE 05/01/2021 1336   HGBUR NEGATIVE 05/01/2021 1336   BILIRUBINUR MODERATE (A) 05/01/2021 1336   KETONESUR NEGATIVE 05/01/2021 1336   PROTEINUR NEGATIVE 05/01/2021 1336   NITRITE  NEGATIVE 05/01/2021 1336   LEUKOCYTESUR NEGATIVE 05/01/2021 1336   Sepsis Labs Invalid input(s): PROCALCITONIN,  WBC,  LACTICIDVEN Microbiology Recent Results (from the past 240 hour(s))  Culture, body fluid w Gram Stain-bottle     Status: None   Collection Time: 05/02/21  3:27 PM   Specimen: Fluid  Result Value Ref Range Status   Specimen Description FLUID  Final   Special Requests PERITONEAL  Final   Culture   Final    NO GROWTH 5  DAYS Performed at [facility], [place] [address] [place], [place] [address]    Report Status 05/07/2021 FINAL  Final  Gram stain     Status: None   Collection Time: 05/02/21  3:27 PM   Specimen: Fluid  Result Value Ref Range Status   Specimen Description FLUID  Final   Special Requests PERITONEAL  Final   Gram Stain   Final    WBC PRESENT,BOTH PMN AND MONONUCLEAR NO ORGANISMS SEEN CYTOSPIN SMEAR Performed at [facility], [address] [place] [address] [place], [place] [address]    Report Status 05/02/2021 FINAL  Final     Time coordinating discharge: Over 39 minutes.   SIGNED:   Hosie Poisson, MD  [facility]

## 2021-05-10 DIAGNOSIS — F10188 Alcohol abuse with other alcohol-induced disorder: Secondary | ICD-10-CM | POA: Diagnosis not present

## 2021-05-10 DIAGNOSIS — D649 Anemia, unspecified: Secondary | ICD-10-CM | POA: Diagnosis not present

## 2021-05-10 DIAGNOSIS — K767 Hepatorenal syndrome: Secondary | ICD-10-CM | POA: Diagnosis not present

## 2021-05-10 DIAGNOSIS — K2980 Duodenitis without bleeding: Secondary | ICD-10-CM | POA: Diagnosis not present

## 2021-05-10 DIAGNOSIS — D689 Coagulation defect, unspecified: Secondary | ICD-10-CM | POA: Diagnosis not present

## 2021-05-10 DIAGNOSIS — K319 Disease of stomach and duodenum, unspecified: Secondary | ICD-10-CM | POA: Diagnosis not present

## 2021-05-10 DIAGNOSIS — K7010 Alcoholic hepatitis without ascites: Secondary | ICD-10-CM | POA: Diagnosis not present

## 2021-05-10 DIAGNOSIS — R69 Illness, unspecified: Secondary | ICD-10-CM | POA: Diagnosis not present

## 2021-05-10 DIAGNOSIS — Z8616 Personal history of COVID-19: Secondary | ICD-10-CM | POA: Diagnosis not present

## 2021-05-11 DIAGNOSIS — D649 Anemia, unspecified: Secondary | ICD-10-CM | POA: Diagnosis not present

## 2021-05-11 DIAGNOSIS — K7010 Alcoholic hepatitis without ascites: Secondary | ICD-10-CM | POA: Diagnosis not present

## 2021-05-11 DIAGNOSIS — K2980 Duodenitis without bleeding: Secondary | ICD-10-CM | POA: Diagnosis not present

## 2021-05-11 DIAGNOSIS — D689 Coagulation defect, unspecified: Secondary | ICD-10-CM | POA: Diagnosis not present

## 2021-05-11 DIAGNOSIS — K767 Hepatorenal syndrome: Secondary | ICD-10-CM | POA: Diagnosis not present

## 2021-05-11 DIAGNOSIS — R69 Illness, unspecified: Secondary | ICD-10-CM | POA: Diagnosis not present

## 2021-05-11 DIAGNOSIS — F10188 Alcohol abuse with other alcohol-induced disorder: Secondary | ICD-10-CM | POA: Diagnosis not present

## 2021-05-11 DIAGNOSIS — K319 Disease of stomach and duodenum, unspecified: Secondary | ICD-10-CM | POA: Diagnosis not present

## 2021-05-11 DIAGNOSIS — Z8616 Personal history of COVID-19: Secondary | ICD-10-CM | POA: Diagnosis not present

## 2021-05-12 ENCOUNTER — Telehealth: Payer: Self-pay

## 2021-05-12 DIAGNOSIS — F10188 Alcohol abuse with other alcohol-induced disorder: Secondary | ICD-10-CM | POA: Diagnosis not present

## 2021-05-12 DIAGNOSIS — K7010 Alcoholic hepatitis without ascites: Secondary | ICD-10-CM | POA: Diagnosis not present

## 2021-05-12 DIAGNOSIS — K767 Hepatorenal syndrome: Secondary | ICD-10-CM | POA: Diagnosis not present

## 2021-05-12 DIAGNOSIS — D649 Anemia, unspecified: Secondary | ICD-10-CM | POA: Diagnosis not present

## 2021-05-12 DIAGNOSIS — D689 Coagulation defect, unspecified: Secondary | ICD-10-CM | POA: Diagnosis not present

## 2021-05-12 DIAGNOSIS — K2980 Duodenitis without bleeding: Secondary | ICD-10-CM | POA: Diagnosis not present

## 2021-05-12 DIAGNOSIS — K319 Disease of stomach and duodenum, unspecified: Secondary | ICD-10-CM | POA: Diagnosis not present

## 2021-05-12 DIAGNOSIS — R69 Illness, unspecified: Secondary | ICD-10-CM | POA: Diagnosis not present

## 2021-05-12 DIAGNOSIS — Z8616 Personal history of COVID-19: Secondary | ICD-10-CM | POA: Diagnosis not present

## 2021-05-12 NOTE — Telephone Encounter (Signed)
Renee w/Hospice called to report on her home visit today. She states pt mild pitting edema BLE, 1+, not concerning at this time. She states pt is requesting refill on potassium. And she states pt has asked if it would be possible for him to go skydiving. She also mentions she does not necessarily feel pt needs weekly lab draws and wanted to inquire if this could be addressed.   Rx for potassium sent to pharmacy.   Spoke with Dr. Dennard Schaumann and he advised we can hold off on weekly lab draws at this time. Called pt to advise, he states he would like to get labs drawn regularly to check his liver function and would like to know what time frame you would recommend.  Please advise on labs and skydiving request. Thanks!

## 2021-05-13 DIAGNOSIS — D689 Coagulation defect, unspecified: Secondary | ICD-10-CM | POA: Diagnosis not present

## 2021-05-13 DIAGNOSIS — K767 Hepatorenal syndrome: Secondary | ICD-10-CM | POA: Diagnosis not present

## 2021-05-13 DIAGNOSIS — K7010 Alcoholic hepatitis without ascites: Secondary | ICD-10-CM | POA: Diagnosis not present

## 2021-05-13 DIAGNOSIS — K319 Disease of stomach and duodenum, unspecified: Secondary | ICD-10-CM | POA: Diagnosis not present

## 2021-05-13 DIAGNOSIS — Z8616 Personal history of COVID-19: Secondary | ICD-10-CM | POA: Diagnosis not present

## 2021-05-13 DIAGNOSIS — R69 Illness, unspecified: Secondary | ICD-10-CM | POA: Diagnosis not present

## 2021-05-13 DIAGNOSIS — D649 Anemia, unspecified: Secondary | ICD-10-CM | POA: Diagnosis not present

## 2021-05-13 DIAGNOSIS — K2980 Duodenitis without bleeding: Secondary | ICD-10-CM | POA: Diagnosis not present

## 2021-05-13 DIAGNOSIS — F10188 Alcohol abuse with other alcohol-induced disorder: Secondary | ICD-10-CM | POA: Diagnosis not present

## 2021-05-14 DIAGNOSIS — K2980 Duodenitis without bleeding: Secondary | ICD-10-CM | POA: Diagnosis not present

## 2021-05-14 DIAGNOSIS — Z8616 Personal history of COVID-19: Secondary | ICD-10-CM | POA: Diagnosis not present

## 2021-05-14 DIAGNOSIS — K7010 Alcoholic hepatitis without ascites: Secondary | ICD-10-CM | POA: Diagnosis not present

## 2021-05-14 DIAGNOSIS — F10188 Alcohol abuse with other alcohol-induced disorder: Secondary | ICD-10-CM | POA: Diagnosis not present

## 2021-05-14 DIAGNOSIS — K319 Disease of stomach and duodenum, unspecified: Secondary | ICD-10-CM | POA: Diagnosis not present

## 2021-05-14 DIAGNOSIS — K767 Hepatorenal syndrome: Secondary | ICD-10-CM | POA: Diagnosis not present

## 2021-05-14 DIAGNOSIS — D689 Coagulation defect, unspecified: Secondary | ICD-10-CM | POA: Diagnosis not present

## 2021-05-14 DIAGNOSIS — R69 Illness, unspecified: Secondary | ICD-10-CM | POA: Diagnosis not present

## 2021-05-14 DIAGNOSIS — D649 Anemia, unspecified: Secondary | ICD-10-CM | POA: Diagnosis not present

## 2021-05-15 ENCOUNTER — Other Ambulatory Visit: Payer: Self-pay

## 2021-05-15 DIAGNOSIS — R69 Illness, unspecified: Secondary | ICD-10-CM | POA: Diagnosis not present

## 2021-05-15 DIAGNOSIS — K319 Disease of stomach and duodenum, unspecified: Secondary | ICD-10-CM | POA: Diagnosis not present

## 2021-05-15 DIAGNOSIS — F10188 Alcohol abuse with other alcohol-induced disorder: Secondary | ICD-10-CM | POA: Diagnosis not present

## 2021-05-15 DIAGNOSIS — K2980 Duodenitis without bleeding: Secondary | ICD-10-CM | POA: Diagnosis not present

## 2021-05-15 DIAGNOSIS — K767 Hepatorenal syndrome: Secondary | ICD-10-CM | POA: Diagnosis not present

## 2021-05-15 DIAGNOSIS — D689 Coagulation defect, unspecified: Secondary | ICD-10-CM | POA: Diagnosis not present

## 2021-05-15 DIAGNOSIS — Z8616 Personal history of COVID-19: Secondary | ICD-10-CM | POA: Diagnosis not present

## 2021-05-15 DIAGNOSIS — D649 Anemia, unspecified: Secondary | ICD-10-CM | POA: Diagnosis not present

## 2021-05-15 DIAGNOSIS — K7010 Alcoholic hepatitis without ascites: Secondary | ICD-10-CM | POA: Diagnosis not present

## 2021-05-15 NOTE — Telephone Encounter (Signed)
LMTR[facility] to advise pt no benefit to regular lab draws at this time, per Dr Dennard Schaumann   Dr. Dennard Schaumann - Please advise on pt's request to go skydiving. Thanks!

## 2021-05-15 NOTE — Telephone Encounter (Signed)
Spoke with patient advised about labwork.

## 2021-05-16 ENCOUNTER — Other Ambulatory Visit (INDEPENDENT_AMBULATORY_CARE_PROVIDER_SITE_OTHER): Payer: Self-pay | Admitting: Family Medicine

## 2021-05-16 DIAGNOSIS — R69 Illness, unspecified: Secondary | ICD-10-CM | POA: Diagnosis not present

## 2021-05-16 DIAGNOSIS — K2980 Duodenitis without bleeding: Secondary | ICD-10-CM | POA: Diagnosis not present

## 2021-05-16 DIAGNOSIS — K767 Hepatorenal syndrome: Secondary | ICD-10-CM | POA: Diagnosis not present

## 2021-05-16 DIAGNOSIS — K319 Disease of stomach and duodenum, unspecified: Secondary | ICD-10-CM | POA: Diagnosis not present

## 2021-05-16 DIAGNOSIS — D649 Anemia, unspecified: Secondary | ICD-10-CM | POA: Diagnosis not present

## 2021-05-16 DIAGNOSIS — K7010 Alcoholic hepatitis without ascites: Secondary | ICD-10-CM | POA: Diagnosis not present

## 2021-05-16 DIAGNOSIS — D689 Coagulation defect, unspecified: Secondary | ICD-10-CM | POA: Diagnosis not present

## 2021-05-16 DIAGNOSIS — Z8616 Personal history of COVID-19: Secondary | ICD-10-CM | POA: Diagnosis not present

## 2021-05-16 DIAGNOSIS — F10188 Alcohol abuse with other alcohol-induced disorder: Secondary | ICD-10-CM | POA: Diagnosis not present

## 2021-05-16 NOTE — Telephone Encounter (Signed)
Ok to refill 

## 2021-05-17 ENCOUNTER — Telehealth: Payer: Self-pay

## 2021-05-17 DIAGNOSIS — Z8616 Personal history of COVID-19: Secondary | ICD-10-CM | POA: Diagnosis not present

## 2021-05-17 DIAGNOSIS — K7010 Alcoholic hepatitis without ascites: Secondary | ICD-10-CM | POA: Diagnosis not present

## 2021-05-17 DIAGNOSIS — R69 Illness, unspecified: Secondary | ICD-10-CM | POA: Diagnosis not present

## 2021-05-17 DIAGNOSIS — K2980 Duodenitis without bleeding: Secondary | ICD-10-CM | POA: Diagnosis not present

## 2021-05-17 DIAGNOSIS — K767 Hepatorenal syndrome: Secondary | ICD-10-CM | POA: Diagnosis not present

## 2021-05-17 DIAGNOSIS — K319 Disease of stomach and duodenum, unspecified: Secondary | ICD-10-CM | POA: Diagnosis not present

## 2021-05-17 DIAGNOSIS — D649 Anemia, unspecified: Secondary | ICD-10-CM | POA: Diagnosis not present

## 2021-05-17 DIAGNOSIS — D689 Coagulation defect, unspecified: Secondary | ICD-10-CM | POA: Diagnosis not present

## 2021-05-17 DIAGNOSIS — F10188 Alcohol abuse with other alcohol-induced disorder: Secondary | ICD-10-CM | POA: Diagnosis not present

## 2021-05-17 NOTE — Telephone Encounter (Signed)
Received paperwork from [employer].  Refaxed to medical records for medical information.

## 2021-05-17 NOTE — Telephone Encounter (Signed)
Spoke with Christopher Carrillo.  They will send disability paperwork.

## 2021-05-18 ENCOUNTER — Other Ambulatory Visit: Payer: No Typology Code available for payment source

## 2021-05-18 ENCOUNTER — Other Ambulatory Visit: Payer: Self-pay

## 2021-05-18 ENCOUNTER — Telehealth: Payer: Self-pay

## 2021-05-18 DIAGNOSIS — K7010 Alcoholic hepatitis without ascites: Secondary | ICD-10-CM | POA: Diagnosis not present

## 2021-05-18 DIAGNOSIS — Z8616 Personal history of COVID-19: Secondary | ICD-10-CM | POA: Diagnosis not present

## 2021-05-18 DIAGNOSIS — D689 Coagulation defect, unspecified: Secondary | ICD-10-CM | POA: Diagnosis not present

## 2021-05-18 DIAGNOSIS — K767 Hepatorenal syndrome: Secondary | ICD-10-CM | POA: Diagnosis not present

## 2021-05-18 DIAGNOSIS — K319 Disease of stomach and duodenum, unspecified: Secondary | ICD-10-CM | POA: Diagnosis not present

## 2021-05-18 DIAGNOSIS — K7030 Alcoholic cirrhosis of liver without ascites: Secondary | ICD-10-CM

## 2021-05-18 DIAGNOSIS — K2980 Duodenitis without bleeding: Secondary | ICD-10-CM | POA: Diagnosis not present

## 2021-05-18 DIAGNOSIS — R69 Illness, unspecified: Secondary | ICD-10-CM | POA: Diagnosis not present

## 2021-05-18 DIAGNOSIS — R17 Unspecified jaundice: Secondary | ICD-10-CM

## 2021-05-18 DIAGNOSIS — F10188 Alcohol abuse with other alcohol-induced disorder: Secondary | ICD-10-CM | POA: Diagnosis not present

## 2021-05-18 DIAGNOSIS — D649 Anemia, unspecified: Secondary | ICD-10-CM | POA: Diagnosis not present

## 2021-05-18 LAB — CBC WITH DIFFERENTIAL/PLATELET
Absolute Monocytes: 923 cells/uL (ref 200–950)
Basophils Absolute: 12 cells/uL (ref 0–200)
Basophils Relative: 0.1 %
Eosinophils Absolute: 25 cells/uL (ref 15–500)
Eosinophils Relative: 0.2 %
HCT: 28.2 % — ABNORMAL LOW (ref 38.5–50.0)
Hemoglobin: 10.2 g/dL — ABNORMAL LOW (ref 13.2–17.1)
Lymphs Abs: 344 cells/uL — ABNORMAL LOW (ref 850–3900)
MCH: 34.2 pg — ABNORMAL HIGH (ref 27.0–33.0)
MCHC: 36.2 g/dL — ABNORMAL HIGH (ref 32.0–36.0)
MCV: 94.6 fL (ref 80.0–100.0)
MPV: 11.3 fL (ref 7.5–12.5)
Monocytes Relative: 7.5 %
Neutro Abs: 10996 cells/uL — ABNORMAL HIGH (ref 1500–7800)
Neutrophils Relative %: 89.4 %
Platelets: 169 10*3/uL (ref 140–400)
RBC: 2.98 10*6/uL — ABNORMAL LOW (ref 4.20–5.80)
RDW: 15 % (ref 11.0–15.0)
Total Lymphocyte: 2.8 %
WBC: 12.3 10*3/uL — ABNORMAL HIGH (ref 3.8–10.8)

## 2021-05-18 LAB — COMPREHENSIVE METABOLIC PANEL
AG Ratio: 1.9 (calc) (ref 1.0–2.5)
ALT: 82 U/L — ABNORMAL HIGH (ref 9–46)
AST: 60 U/L — ABNORMAL HIGH (ref 10–40)
Albumin: 3.7 g/dL (ref 3.6–5.1)
Alkaline phosphatase (APISO): 262 U/L — ABNORMAL HIGH (ref 36–130)
BUN: 17 mg/dL (ref 7–25)
CO2: 19 mmol/L — ABNORMAL LOW (ref 20–32)
Calcium: 8.7 mg/dL (ref 8.6–10.3)
Chloride: 107 mmol/L (ref 98–110)
Creat: 1.1 mg/dL (ref 0.60–1.29)
Globulin: 2 g/dL (calc) (ref 1.9–3.7)
Glucose, Bld: 157 mg/dL — ABNORMAL HIGH (ref 65–99)
Potassium: 3.6 mmol/L (ref 3.5–5.3)
Sodium: 137 mmol/L (ref 135–146)
Total Bilirubin: 12.1 mg/dL — ABNORMAL HIGH (ref 0.2–1.2)
Total Protein: 5.7 g/dL — ABNORMAL LOW (ref 6.1–8.1)

## 2021-05-18 NOTE — Telephone Encounter (Signed)
Pt came in to have some forms filled out  by pcp. Pt filled out an intake form. Intake form and ppw placed in nurse's folder. Please advise.  Cb #: [id number]

## 2021-05-19 ENCOUNTER — Other Ambulatory Visit: Payer: No Typology Code available for payment source

## 2021-05-19 ENCOUNTER — Telehealth: Payer: Self-pay

## 2021-05-19 DIAGNOSIS — K7010 Alcoholic hepatitis without ascites: Secondary | ICD-10-CM | POA: Diagnosis not present

## 2021-05-19 DIAGNOSIS — D649 Anemia, unspecified: Secondary | ICD-10-CM | POA: Diagnosis not present

## 2021-05-19 DIAGNOSIS — Z8616 Personal history of COVID-19: Secondary | ICD-10-CM | POA: Diagnosis not present

## 2021-05-19 DIAGNOSIS — K319 Disease of stomach and duodenum, unspecified: Secondary | ICD-10-CM | POA: Diagnosis not present

## 2021-05-19 DIAGNOSIS — R69 Illness, unspecified: Secondary | ICD-10-CM | POA: Diagnosis not present

## 2021-05-19 DIAGNOSIS — F10188 Alcohol abuse with other alcohol-induced disorder: Secondary | ICD-10-CM | POA: Diagnosis not present

## 2021-05-19 DIAGNOSIS — K2980 Duodenitis without bleeding: Secondary | ICD-10-CM | POA: Diagnosis not present

## 2021-05-19 DIAGNOSIS — D689 Coagulation defect, unspecified: Secondary | ICD-10-CM | POA: Diagnosis not present

## 2021-05-19 DIAGNOSIS — K767 Hepatorenal syndrome: Secondary | ICD-10-CM | POA: Diagnosis not present

## 2021-05-19 LAB — PROTIME-INR
INR: 1.2 — ABNORMAL HIGH
Prothrombin Time: 12.4 s — ABNORMAL HIGH (ref 9.0–11.5)

## 2021-05-19 NOTE — Telephone Encounter (Signed)
Caryl Pina w/[facility] did home visit with pt today. She reports pt has increased edema to BLE, to the thigh. Pt reports occasional feeling of his abdomen being distended, not noted today. She states pt reports he has not been taking lactulose regularly. She is concerned about the edema as it has increased since her last visit. She also reports he is complaining of increased pain in his feet d/t the swelling. She said he is asking for a higher level pain medication as the current Tramadol only takes the edge off, pt is only taking BID.   Please advise, thanks!

## 2021-05-19 NOTE — Telephone Encounter (Signed)
Forms completed and given to provider for signature.

## 2021-05-19 NOTE — Telephone Encounter (Signed)
Spoke with Caryl Pina and advised her pt has been advised he needs OV next week, stated he will call on Monday to schedule as he needs to check with his wife.

## 2021-05-20 DIAGNOSIS — K2980 Duodenitis without bleeding: Secondary | ICD-10-CM | POA: Diagnosis not present

## 2021-05-20 DIAGNOSIS — K767 Hepatorenal syndrome: Secondary | ICD-10-CM | POA: Diagnosis not present

## 2021-05-20 DIAGNOSIS — R69 Illness, unspecified: Secondary | ICD-10-CM | POA: Diagnosis not present

## 2021-05-20 DIAGNOSIS — F10188 Alcohol abuse with other alcohol-induced disorder: Secondary | ICD-10-CM | POA: Diagnosis not present

## 2021-05-20 DIAGNOSIS — D649 Anemia, unspecified: Secondary | ICD-10-CM | POA: Diagnosis not present

## 2021-05-20 DIAGNOSIS — Z8616 Personal history of COVID-19: Secondary | ICD-10-CM | POA: Diagnosis not present

## 2021-05-20 DIAGNOSIS — K319 Disease of stomach and duodenum, unspecified: Secondary | ICD-10-CM | POA: Diagnosis not present

## 2021-05-20 DIAGNOSIS — K7010 Alcoholic hepatitis without ascites: Secondary | ICD-10-CM | POA: Diagnosis not present

## 2021-05-20 DIAGNOSIS — D689 Coagulation defect, unspecified: Secondary | ICD-10-CM | POA: Diagnosis not present

## 2021-05-21 DIAGNOSIS — F10188 Alcohol abuse with other alcohol-induced disorder: Secondary | ICD-10-CM | POA: Diagnosis not present

## 2021-05-21 DIAGNOSIS — K7010 Alcoholic hepatitis without ascites: Secondary | ICD-10-CM | POA: Diagnosis not present

## 2021-05-21 DIAGNOSIS — Z8616 Personal history of COVID-19: Secondary | ICD-10-CM | POA: Diagnosis not present

## 2021-05-21 DIAGNOSIS — K2980 Duodenitis without bleeding: Secondary | ICD-10-CM | POA: Diagnosis not present

## 2021-05-21 DIAGNOSIS — K319 Disease of stomach and duodenum, unspecified: Secondary | ICD-10-CM | POA: Diagnosis not present

## 2021-05-21 DIAGNOSIS — D689 Coagulation defect, unspecified: Secondary | ICD-10-CM | POA: Diagnosis not present

## 2021-05-21 DIAGNOSIS — D649 Anemia, unspecified: Secondary | ICD-10-CM | POA: Diagnosis not present

## 2021-05-21 DIAGNOSIS — R69 Illness, unspecified: Secondary | ICD-10-CM | POA: Diagnosis not present

## 2021-05-21 DIAGNOSIS — K767 Hepatorenal syndrome: Secondary | ICD-10-CM | POA: Diagnosis not present

## 2021-05-22 ENCOUNTER — Telehealth: Payer: Self-pay

## 2021-05-22 DIAGNOSIS — R69 Illness, unspecified: Secondary | ICD-10-CM | POA: Diagnosis not present

## 2021-05-22 DIAGNOSIS — Z8616 Personal history of COVID-19: Secondary | ICD-10-CM | POA: Diagnosis not present

## 2021-05-22 DIAGNOSIS — K2980 Duodenitis without bleeding: Secondary | ICD-10-CM | POA: Diagnosis not present

## 2021-05-22 DIAGNOSIS — K767 Hepatorenal syndrome: Secondary | ICD-10-CM | POA: Diagnosis not present

## 2021-05-22 DIAGNOSIS — D689 Coagulation defect, unspecified: Secondary | ICD-10-CM | POA: Diagnosis not present

## 2021-05-22 DIAGNOSIS — K319 Disease of stomach and duodenum, unspecified: Secondary | ICD-10-CM | POA: Diagnosis not present

## 2021-05-22 DIAGNOSIS — D649 Anemia, unspecified: Secondary | ICD-10-CM | POA: Diagnosis not present

## 2021-05-22 DIAGNOSIS — F10188 Alcohol abuse with other alcohol-induced disorder: Secondary | ICD-10-CM | POA: Diagnosis not present

## 2021-05-22 DIAGNOSIS — K7010 Alcoholic hepatitis without ascites: Secondary | ICD-10-CM | POA: Diagnosis not present

## 2021-05-22 NOTE — Telephone Encounter (Signed)
Calling to verified Short Term Disability for pt

## 2021-05-23 ENCOUNTER — Other Ambulatory Visit: Payer: Self-pay

## 2021-05-23 ENCOUNTER — Other Ambulatory Visit: Payer: Self-pay | Admitting: Family Medicine

## 2021-05-23 DIAGNOSIS — F10188 Alcohol abuse with other alcohol-induced disorder: Secondary | ICD-10-CM | POA: Diagnosis not present

## 2021-05-23 DIAGNOSIS — K7010 Alcoholic hepatitis without ascites: Secondary | ICD-10-CM | POA: Diagnosis not present

## 2021-05-23 DIAGNOSIS — D689 Coagulation defect, unspecified: Secondary | ICD-10-CM | POA: Diagnosis not present

## 2021-05-23 DIAGNOSIS — K319 Disease of stomach and duodenum, unspecified: Secondary | ICD-10-CM | POA: Diagnosis not present

## 2021-05-23 DIAGNOSIS — D649 Anemia, unspecified: Secondary | ICD-10-CM | POA: Diagnosis not present

## 2021-05-23 DIAGNOSIS — K2980 Duodenitis without bleeding: Secondary | ICD-10-CM | POA: Diagnosis not present

## 2021-05-23 DIAGNOSIS — K767 Hepatorenal syndrome: Secondary | ICD-10-CM | POA: Diagnosis not present

## 2021-05-23 DIAGNOSIS — R69 Illness, unspecified: Secondary | ICD-10-CM | POA: Diagnosis not present

## 2021-05-23 DIAGNOSIS — Z8616 Personal history of COVID-19: Secondary | ICD-10-CM | POA: Diagnosis not present

## 2021-05-24 DIAGNOSIS — F10188 Alcohol abuse with other alcohol-induced disorder: Secondary | ICD-10-CM | POA: Diagnosis not present

## 2021-05-24 DIAGNOSIS — D689 Coagulation defect, unspecified: Secondary | ICD-10-CM | POA: Diagnosis not present

## 2021-05-24 DIAGNOSIS — R69 Illness, unspecified: Secondary | ICD-10-CM | POA: Diagnosis not present

## 2021-05-24 DIAGNOSIS — Z8616 Personal history of COVID-19: Secondary | ICD-10-CM | POA: Diagnosis not present

## 2021-05-24 DIAGNOSIS — K767 Hepatorenal syndrome: Secondary | ICD-10-CM | POA: Diagnosis not present

## 2021-05-24 DIAGNOSIS — K319 Disease of stomach and duodenum, unspecified: Secondary | ICD-10-CM | POA: Diagnosis not present

## 2021-05-24 DIAGNOSIS — K2980 Duodenitis without bleeding: Secondary | ICD-10-CM | POA: Diagnosis not present

## 2021-05-24 DIAGNOSIS — D649 Anemia, unspecified: Secondary | ICD-10-CM | POA: Diagnosis not present

## 2021-05-24 DIAGNOSIS — K7010 Alcoholic hepatitis without ascites: Secondary | ICD-10-CM | POA: Diagnosis not present

## 2021-05-24 NOTE — Telephone Encounter (Signed)
LOV 04/14/21 Last refill 04/21/21, #30, 0 refills  Please review, thanks!

## 2021-05-25 DIAGNOSIS — K7010 Alcoholic hepatitis without ascites: Secondary | ICD-10-CM | POA: Diagnosis not present

## 2021-05-25 DIAGNOSIS — R69 Illness, unspecified: Secondary | ICD-10-CM | POA: Diagnosis not present

## 2021-05-25 DIAGNOSIS — D649 Anemia, unspecified: Secondary | ICD-10-CM | POA: Diagnosis not present

## 2021-05-25 DIAGNOSIS — K2980 Duodenitis without bleeding: Secondary | ICD-10-CM | POA: Diagnosis not present

## 2021-05-25 DIAGNOSIS — D689 Coagulation defect, unspecified: Secondary | ICD-10-CM | POA: Diagnosis not present

## 2021-05-25 DIAGNOSIS — F10188 Alcohol abuse with other alcohol-induced disorder: Secondary | ICD-10-CM | POA: Diagnosis not present

## 2021-05-25 DIAGNOSIS — Z8616 Personal history of COVID-19: Secondary | ICD-10-CM | POA: Diagnosis not present

## 2021-05-25 DIAGNOSIS — K319 Disease of stomach and duodenum, unspecified: Secondary | ICD-10-CM | POA: Diagnosis not present

## 2021-05-25 DIAGNOSIS — K767 Hepatorenal syndrome: Secondary | ICD-10-CM | POA: Diagnosis not present

## 2021-05-26 ENCOUNTER — Ambulatory Visit (INDEPENDENT_AMBULATORY_CARE_PROVIDER_SITE_OTHER): Payer: No Typology Code available for payment source | Admitting: Family Medicine

## 2021-05-26 ENCOUNTER — Other Ambulatory Visit: Payer: Self-pay

## 2021-05-26 ENCOUNTER — Encounter: Payer: Self-pay | Admitting: Family Medicine

## 2021-05-26 VITALS — BP 132/82 | HR 103 | Temp 97.7°F | Resp 18 | Ht 68.0 in | Wt 200.0 lb

## 2021-05-26 DIAGNOSIS — D689 Coagulation defect, unspecified: Secondary | ICD-10-CM | POA: Diagnosis not present

## 2021-05-26 DIAGNOSIS — D649 Anemia, unspecified: Secondary | ICD-10-CM | POA: Diagnosis not present

## 2021-05-26 DIAGNOSIS — K7010 Alcoholic hepatitis without ascites: Secondary | ICD-10-CM

## 2021-05-26 DIAGNOSIS — K2980 Duodenitis without bleeding: Secondary | ICD-10-CM | POA: Diagnosis not present

## 2021-05-26 DIAGNOSIS — F10188 Alcohol abuse with other alcohol-induced disorder: Secondary | ICD-10-CM | POA: Diagnosis not present

## 2021-05-26 DIAGNOSIS — K767 Hepatorenal syndrome: Secondary | ICD-10-CM | POA: Diagnosis not present

## 2021-05-26 DIAGNOSIS — K319 Disease of stomach and duodenum, unspecified: Secondary | ICD-10-CM | POA: Diagnosis not present

## 2021-05-26 DIAGNOSIS — Z8616 Personal history of COVID-19: Secondary | ICD-10-CM | POA: Diagnosis not present

## 2021-05-26 DIAGNOSIS — R69 Illness, unspecified: Secondary | ICD-10-CM | POA: Diagnosis not present

## 2021-05-26 NOTE — Progress Notes (Signed)
Subjective:    Patient ID: Christopher Carrillo, male    DOB: May 24, 1970, 51 y.o.   MRN: [id number]  HPI Patient is a 51 year old Caucasian gentleman who has been dealing with liver failure secondary to alcoholic hepatitis and cirrhosis.  Bilirubin had trended above 30!Marland Kitchen  He was admitted to the hospital with hepatorenal syndrome and then was discharged home on hospice.  However, his lab work continues to gradually improve.  His most recent lab work from a week ago was the best that has been in more than a month.  Bilirubin had trended down to 12.  INR is down to 1.2.  Protein remain low at 5.7.  Creatinine had normalized.  He presents today however with increased swelling in his hands and in his feet.  He has +2 edema up to his knees.  He is still jaundiced although much improved from previously.  He is using rifaximin and lactulose as prescribed.  He is having more than 6 bowel movements a day.  His wife states that occasionally he is confused and lethargic.  He is also taking tramadol for pain but his pain is primarily due to the swelling in his legs and the aching and throbbing in his legs secondary to the swelling Past Medical History:  Diagnosis Date   Abdominal pain    Alcoholic hepatitis without ascites    Anemia    Anxiety    Arthritis    Elevated bilirubin    Essential hypertension    Psoriasis    Transaminitis    Past Surgical History:  Procedure Laterality Date   BIOPSY  07/20/2019   Procedure: BIOPSY;  Surgeon: Daneil Dolin, MD;  [facility];  Service: Endoscopy;;   BIOPSY  08/14/2019   Procedure: BIOPSY;  Surgeon: Thornton Park, MD;  Location: [facility];  Service: Gastroenterology;;   COLONOSCOPY     COLONOSCOPY WITH PROPOFOL N/A 07/20/2019   Procedure: COLONOSCOPY WITH PROPOFOL;  Surgeon: Daneil Dolin, MD;  [facility];  Service: Endoscopy;  Laterality: N/A;  10:15am   COLONOSCOPY WITH PROPOFOL N/A 08/14/2019   Procedure: COLONOSCOPY WITH  PROPOFOL;  Surgeon: Thornton Park, MD;  Location: [facility];  Service: Gastroenterology;  Laterality: N/A;   IR PARACENTESIS  08/14/2019   IR PARACENTESIS  05/02/2021   POLYPECTOMY  07/20/2019   Procedure: POLYPECTOMY;  Surgeon: Daneil Dolin, MD;  [facility];  Service: Endoscopy;;   SUBMUCOSAL TATTOO INJECTION  08/14/2019   Procedure: SUBMUCOSAL TATTOO INJECTION;  Surgeon: Thornton Park, MD;  Location: [facility];  Service: Gastroenterology;;   Current Outpatient Medications on File Prior to Visit  Medication Sig Dispense Refill   feeding supplement (ENSURE ENLIVE / ENSURE PLUS) LIQD Take 237 mLs by mouth 3 (three) times daily between meals. 191 mL 12   folic acid (FOLVITE) 1 MG tablet Take 1 tablet (1 mg total) by mouth daily. 30 tablet 3   lactulose (CHRONULAC) 10 GM/15ML solution Take 30 mLs (20 g total) by mouth daily. 473 mL 2   Multiple Vitamins-Minerals (MULTIVITAMIN WITH MINERALS) tablet Take 1 tablet by mouth daily. 120 tablet 2   ondansetron (ZOFRAN) 4 MG tablet Take 1 tablet (4 mg total) by mouth every 6 (six) hours as needed for nausea. 20 tablet 0   pantoprazole (PROTONIX) 40 MG tablet Take 1 tablet (40 mg total) by mouth 2 (two) times daily before a meal. 60 tablet 2   potassium chloride SA (KLOR-CON M) 20 MEQ tablet TAKE 1  TABLET BY MOUTH DAILY 30 tablet 0   predniSONE (DELTASONE) 20 MG tablet Take 2 tablets (40 mg total) by mouth daily with breakfast for 24 days. 48 tablet 0   rifaximin (XIFAXAN) 550 MG TABS tablet Take 1 tablet (550 mg total) by mouth 2 (two) times daily. 90 tablet 2   thiamine 100 MG tablet Take 1 tablet (100 mg total) by mouth daily. 30 tablet 2   traMADol (ULTRAM) 50 MG tablet TAKE 1 TABLET(50 MG) BY MOUTH EVERY 6 HOURS AS NEEDED 30 tablet 0   No current facility-administered medications on file prior to visit.   Allergies  Allergen Reactions   Oxycodone Itching   Social History   Socioeconomic History   Marital status:  Married    Spouse name: Not on file   Number of children: Not on file   Years of education: Not on file   Highest education level: Not on file  Occupational History   Not on file  Tobacco Use   Smoking status: Every Day    Packs/day: 0.33    Types: Cigarettes   Smokeless tobacco: Never  Vaping Use   Vaping Use: Never used  Substance and Sexual Activity   Alcohol use: Yes    Alcohol/week: 1.0 standard drink    Types: 1 Cans of beer per week    Comment: occasionally   Drug use: No   Sexual activity: Yes  Other Topics Concern   Not on file  Social History Narrative   Not on file   Social Determinants of Health   Financial Resource Strain: Not on file  Food Insecurity: Not on file  Transportation Needs: Not on file  Physical Activity: Not on file  Stress: Not on file  Social Connections: Not on file  Intimate Partner Violence: Not on file     Review of Systems     Objective:   Physical Exam Vitals reviewed.  Constitutional:      General: He is not in acute distress.    Appearance: He is normal weight. He is ill-appearing. He is not toxic-appearing or diaphoretic.  Eyes:     General: Scleral icterus present.  Cardiovascular:     Rate and Rhythm: Normal rate and regular rhythm.     Heart sounds: Normal heart sounds. No murmur heard.   No friction rub. No gallop.  Pulmonary:     Effort: Pulmonary effort is normal. No respiratory distress.     Breath sounds: Normal breath sounds. No stridor. No wheezing, rhonchi or rales.  Abdominal:     General: Abdomen is flat. Bowel sounds are normal. There is no distension.     Palpations: Abdomen is soft.     Tenderness: There is no abdominal tenderness. There is no guarding or rebound.  Musculoskeletal:     Right lower leg: Edema present.     Left lower leg: Edema present.  Skin:    Coloration: Skin is jaundiced. Skin is not pale.  Neurological:     General: No focal deficit present.     Mental Status: He is alert and  oriented to person, place, and time. Mental status is at baseline.          Assessment & Plan:  Alcoholic hepatitis without ascites - Plan: CBC with Differential/Platelet, COMPLETE METABOLIC PANEL WITH GFR, PT with INR/Fingerstick, Protime-INR  Chronic alcoholic hepatitis Patient is requesting fluid pills to help with the swelling due to the pain in his legs.  I explained hepatorenal syndrome and how  even though he appears swollen and overloaded with fluid he actually has decreased circulating intravascular volume and is third spacing.  Therefore we have to be very cautious about diuretics.  We will start Lasix 20 mg a day with spironolactone 25 mg a day.  I will obtain a CBC and a CMP and an INR today and plan to repeat the blood work on Monday so that he will have an updated set of labs prior to seeing his gastroenterologist on Tuesday.  Patient has been abstinent from alcohol since discharge from the hospital.  Also recommended avoiding any medications that can be mood altering such as pain medication due to his decreased level of alertness.

## 2021-05-27 DIAGNOSIS — K319 Disease of stomach and duodenum, unspecified: Secondary | ICD-10-CM | POA: Diagnosis not present

## 2021-05-27 DIAGNOSIS — K767 Hepatorenal syndrome: Secondary | ICD-10-CM | POA: Diagnosis not present

## 2021-05-27 DIAGNOSIS — D649 Anemia, unspecified: Secondary | ICD-10-CM | POA: Diagnosis not present

## 2021-05-27 DIAGNOSIS — F10188 Alcohol abuse with other alcohol-induced disorder: Secondary | ICD-10-CM | POA: Diagnosis not present

## 2021-05-27 DIAGNOSIS — D689 Coagulation defect, unspecified: Secondary | ICD-10-CM | POA: Diagnosis not present

## 2021-05-27 DIAGNOSIS — K7010 Alcoholic hepatitis without ascites: Secondary | ICD-10-CM | POA: Diagnosis not present

## 2021-05-27 DIAGNOSIS — R69 Illness, unspecified: Secondary | ICD-10-CM | POA: Diagnosis not present

## 2021-05-27 DIAGNOSIS — Z8616 Personal history of COVID-19: Secondary | ICD-10-CM | POA: Diagnosis not present

## 2021-05-27 DIAGNOSIS — K2980 Duodenitis without bleeding: Secondary | ICD-10-CM | POA: Diagnosis not present

## 2021-05-27 LAB — PROTIME-INR
INR: 1.3 — ABNORMAL HIGH
Prothrombin Time: 12.6 s — ABNORMAL HIGH (ref 9.0–11.5)

## 2021-05-27 LAB — CBC WITH DIFFERENTIAL/PLATELET
Absolute Monocytes: 466 cells/uL (ref 200–950)
Basophils Absolute: 9 cells/uL (ref 0–200)
Basophils Relative: 0.1 %
Eosinophils Absolute: 0 cells/uL — ABNORMAL LOW (ref 15–500)
Eosinophils Relative: 0 %
HCT: 27.2 % — ABNORMAL LOW (ref 38.5–50.0)
Hemoglobin: 9.8 g/dL — ABNORMAL LOW (ref 13.2–17.1)
Lymphs Abs: 440 cells/uL — ABNORMAL LOW (ref 850–3900)
MCH: 34 pg — ABNORMAL HIGH (ref 27.0–33.0)
MCHC: 36 g/dL (ref 32.0–36.0)
MCV: 94.4 fL (ref 80.0–100.0)
MPV: 11.3 fL (ref 7.5–12.5)
Monocytes Relative: 5.3 %
Neutro Abs: 7885 cells/uL — ABNORMAL HIGH (ref 1500–7800)
Neutrophils Relative %: 89.6 %
Platelets: 122 10*3/uL — ABNORMAL LOW (ref 140–400)
RBC: 2.88 10*6/uL — ABNORMAL LOW (ref 4.20–5.80)
RDW: 14.7 % (ref 11.0–15.0)
Total Lymphocyte: 5 %
WBC: 8.8 10*3/uL (ref 3.8–10.8)

## 2021-05-27 LAB — COMPLETE METABOLIC PANEL WITH GFR
AG Ratio: 1.7 (calc) (ref 1.0–2.5)
ALT: 64 U/L — ABNORMAL HIGH (ref 9–46)
AST: 48 U/L — ABNORMAL HIGH (ref 10–40)
Albumin: 3.3 g/dL — ABNORMAL LOW (ref 3.6–5.1)
Alkaline phosphatase (APISO): 295 U/L — ABNORMAL HIGH (ref 36–130)
BUN: 17 mg/dL (ref 7–25)
CO2: 20 mmol/L (ref 20–32)
Calcium: 8.7 mg/dL (ref 8.6–10.3)
Chloride: 107 mmol/L (ref 98–110)
Creat: 1.03 mg/dL (ref 0.60–1.29)
Globulin: 1.9 g/dL (calc) (ref 1.9–3.7)
Glucose, Bld: 139 mg/dL — ABNORMAL HIGH (ref 65–99)
Potassium: 3.9 mmol/L (ref 3.5–5.3)
Sodium: 137 mmol/L (ref 135–146)
Total Bilirubin: 8.5 mg/dL — ABNORMAL HIGH (ref 0.2–1.2)
Total Protein: 5.2 g/dL — ABNORMAL LOW (ref 6.1–8.1)
eGFR: 89 mL/min/{1.73_m2} (ref >=60)

## 2021-05-28 DIAGNOSIS — K319 Disease of stomach and duodenum, unspecified: Secondary | ICD-10-CM | POA: Diagnosis not present

## 2021-05-28 DIAGNOSIS — K767 Hepatorenal syndrome: Secondary | ICD-10-CM | POA: Diagnosis not present

## 2021-05-28 DIAGNOSIS — K2980 Duodenitis without bleeding: Secondary | ICD-10-CM | POA: Diagnosis not present

## 2021-05-28 DIAGNOSIS — D689 Coagulation defect, unspecified: Secondary | ICD-10-CM | POA: Diagnosis not present

## 2021-05-28 DIAGNOSIS — Z8616 Personal history of COVID-19: Secondary | ICD-10-CM | POA: Diagnosis not present

## 2021-05-28 DIAGNOSIS — K7010 Alcoholic hepatitis without ascites: Secondary | ICD-10-CM | POA: Diagnosis not present

## 2021-05-28 DIAGNOSIS — D649 Anemia, unspecified: Secondary | ICD-10-CM | POA: Diagnosis not present

## 2021-05-28 DIAGNOSIS — F10188 Alcohol abuse with other alcohol-induced disorder: Secondary | ICD-10-CM | POA: Diagnosis not present

## 2021-05-28 DIAGNOSIS — R69 Illness, unspecified: Secondary | ICD-10-CM | POA: Diagnosis not present

## 2021-05-29 ENCOUNTER — Other Ambulatory Visit: Payer: Self-pay

## 2021-05-29 ENCOUNTER — Other Ambulatory Visit: Payer: No Typology Code available for payment source

## 2021-05-29 DIAGNOSIS — D689 Coagulation defect, unspecified: Secondary | ICD-10-CM | POA: Diagnosis not present

## 2021-05-29 DIAGNOSIS — F10188 Alcohol abuse with other alcohol-induced disorder: Secondary | ICD-10-CM | POA: Diagnosis not present

## 2021-05-29 DIAGNOSIS — R69 Illness, unspecified: Secondary | ICD-10-CM | POA: Diagnosis not present

## 2021-05-29 DIAGNOSIS — K319 Disease of stomach and duodenum, unspecified: Secondary | ICD-10-CM | POA: Diagnosis not present

## 2021-05-29 DIAGNOSIS — K7010 Alcoholic hepatitis without ascites: Secondary | ICD-10-CM

## 2021-05-29 DIAGNOSIS — Z8616 Personal history of COVID-19: Secondary | ICD-10-CM | POA: Diagnosis not present

## 2021-05-29 DIAGNOSIS — K2980 Duodenitis without bleeding: Secondary | ICD-10-CM | POA: Diagnosis not present

## 2021-05-29 DIAGNOSIS — K767 Hepatorenal syndrome: Secondary | ICD-10-CM | POA: Diagnosis not present

## 2021-05-29 DIAGNOSIS — D649 Anemia, unspecified: Secondary | ICD-10-CM | POA: Diagnosis not present

## 2021-05-29 LAB — COMPREHENSIVE METABOLIC PANEL
AG Ratio: 1.5 (calc) (ref 1.0–2.5)
ALT: 59 U/L — ABNORMAL HIGH (ref 9–46)
AST: 48 U/L — ABNORMAL HIGH (ref 10–40)
Albumin: 3.4 g/dL — ABNORMAL LOW (ref 3.6–5.1)
Alkaline phosphatase (APISO): 317 U/L — ABNORMAL HIGH (ref 36–130)
BUN: 15 mg/dL (ref 7–25)
CO2: 24 mmol/L (ref 20–32)
Calcium: 8.4 mg/dL — ABNORMAL LOW (ref 8.6–10.3)
Chloride: 104 mmol/L (ref 98–110)
Creat: 0.92 mg/dL (ref 0.60–1.29)
Globulin: 2.3 g/dL (calc) (ref 1.9–3.7)
Glucose, Bld: 105 mg/dL — ABNORMAL HIGH (ref 65–99)
Potassium: 3.5 mmol/L (ref 3.5–5.3)
Sodium: 138 mmol/L (ref 135–146)
Total Bilirubin: 6.8 mg/dL — ABNORMAL HIGH (ref 0.2–1.2)
Total Protein: 5.7 g/dL — ABNORMAL LOW (ref 6.1–8.1)

## 2021-05-30 ENCOUNTER — Encounter: Payer: Self-pay | Admitting: Gastroenterology

## 2021-05-30 ENCOUNTER — Ambulatory Visit (INDEPENDENT_AMBULATORY_CARE_PROVIDER_SITE_OTHER): Payer: No Typology Code available for payment source | Admitting: Gastroenterology

## 2021-05-30 VITALS — BP 144/72 | HR 104 | Ht 68.0 in | Wt 198.0 lb

## 2021-05-30 DIAGNOSIS — D649 Anemia, unspecified: Secondary | ICD-10-CM | POA: Diagnosis not present

## 2021-05-30 DIAGNOSIS — K7469 Other cirrhosis of liver: Secondary | ICD-10-CM | POA: Diagnosis not present

## 2021-05-30 DIAGNOSIS — K7682 Hepatic encephalopathy: Secondary | ICD-10-CM

## 2021-05-30 DIAGNOSIS — K7011 Alcoholic hepatitis with ascites: Secondary | ICD-10-CM | POA: Diagnosis not present

## 2021-05-30 DIAGNOSIS — K7010 Alcoholic hepatitis without ascites: Secondary | ICD-10-CM | POA: Diagnosis not present

## 2021-05-30 DIAGNOSIS — F10188 Alcohol abuse with other alcohol-induced disorder: Secondary | ICD-10-CM | POA: Diagnosis not present

## 2021-05-30 DIAGNOSIS — K767 Hepatorenal syndrome: Secondary | ICD-10-CM | POA: Diagnosis not present

## 2021-05-30 DIAGNOSIS — R69 Illness, unspecified: Secondary | ICD-10-CM | POA: Diagnosis not present

## 2021-05-30 DIAGNOSIS — Z8616 Personal history of COVID-19: Secondary | ICD-10-CM | POA: Diagnosis not present

## 2021-05-30 DIAGNOSIS — K2980 Duodenitis without bleeding: Secondary | ICD-10-CM | POA: Diagnosis not present

## 2021-05-30 DIAGNOSIS — K319 Disease of stomach and duodenum, unspecified: Secondary | ICD-10-CM | POA: Diagnosis not present

## 2021-05-30 DIAGNOSIS — D689 Coagulation defect, unspecified: Secondary | ICD-10-CM | POA: Diagnosis not present

## 2021-05-30 MED ORDER — PREDNISONE 5 MG PO TABS: ORAL_TABLET | ORAL | 3 refills | Status: DC

## 2021-05-30 MED ORDER — SPIRONOLACTONE 25 MG PO TABS: 25.0000 mg | ORAL_TABLET | Freq: Every day | ORAL | 5 refills | Status: DC

## 2021-05-30 NOTE — Patient Instructions (Addendum)
Congratulations on not drinking any alcohol. Continue to participate in counseling.  Start Lasix 20 mg morning and sprionolactone 25 mg every morning.   Have labs in 7-14 days.  Continue to watch your sodium. Do not eat more than 2000 mg daily.   We discussed strategies to treat your cramps. Do not use pickle juice!  Start to reduce your prednisone by 5 mg every 10-14 days.   High-protein diet from a primarily plant-based source. Consider supplementing your diet with low sodium protein shakes.  Avoid red meat.  No raw or undercooked meat, seafood, or shellfish.  Low-fat/cholesterol/carbohydrate diet.  Limit sodium to 2000 mg daily.  Recommend a daily multivitamin.  Recommend 30 minutes of aerobic and resistance exercise at least 3 days a week. Pick up some light or medium sized weights.   Return to the office in 2-4 weeks, earlier if needed.

## 2021-05-30 NOTE — Progress Notes (Signed)
Referring Provider: Susy Frizzle, MD Primary Care Physician:  Susy Frizzle, MD  Chief Complaint:  Cirrhosis   IMPRESSION:  Acute on chronic liver disease - labs have stabilized since completing 28 days of steroids. Overall prognosis remains tenuous.   Alcoholic hepatitis - completed 28 days of treatment. Will taper as outlined below. Formal alcohol counseling recommended.   Type 1 HRS - Recent creatinine has improved. Underlying type 2 HRS present. Will need to be cautious about resuming diuretics.   Ascites - 2000 mg sodium restricted diet. Resume diuretics with very slow taper.   PLAN: - Resume Lasix 20 mg QAM and spironolactone 25 mg QAM - Decrease the prednisone by 5 mg every 2 weeks - CMP in 7-14 days - Abstinence from all alcohol - Formal alcohol counseling recommended - Avoid all hepatotoxic drugs including NSAIDs - Discussed strategies to minimize muscle cramps - Discussed 2000 mg sodium high protein diet - Avoid pain medications - Follow-up with [facility] to readdress liver transplant status - I've asked him not to drive given his hepatic encephalopathy - Office follow-up in 2-4  weeks, earlier if needed  See patient instructions for additional recommendations.   HPI: Christopher Carrillo is a 51 y.o. male who returns in follow-up. His wife accompanies him to this appointment.  He has had multiple hospitalizations over the last couple of months for acute on chronic liver failure related to alcohol.  During his most recent hospitalization 05/01/21-05/05/21 he had concurrent hepatorenal syndrome type I, did not appear to be responding to steroids for alcoholic hepatitis, and was ultimately discharged home on hospice.  However, follow-up labs since that hospitalization show improving lab work.  He reports no alcohol since his most recent hospitalization.   Seen by Dr. Dennard Schaumann last week with concerns for increasing edema in his hands and feet, persistent  jaundice although overall improved, and 6 bowel movements daily on rifaximin and lactulose.  Dr. Dennard Schaumann started him on Lasix 20 mg daily and spironolactone 25 mg daily. But, he never started the spironolactone because he thought he has some at home but he did not. He stopped the Lasix over the weekend while he was waiting for the results of follow-up labs. He could hardly get his shoes on today.  Has started having headaches over the last week at the same time as the edema, although his wife notes that he's never had a headache before.  Also having frequent cramps. Jaundice has improved  He cannot remember when he had his last alcoholic drink. His wife notes no alcohol since his last hospitalization. He doesn't feel like he will drink again. He has established care with [facility] for alcohol and anxiety. He has not seen them since his hospitalization.   He is smoking a pack every 4 days.   05/29/2021: Sodium 138, creatinine 0.92 albumin 3.4, total bilirubin 6.8 Peak creatinine on hospitalization 04/28/2021 3.76 Peak total bilirubin 37.1 04/28/21 Peak INR 1.7 05/05/21   Past Medical History:  Diagnosis Date   Abdominal pain    Alcoholic hepatitis without ascites    Anemia    Anxiety    Arthritis    Elevated bilirubin    Essential hypertension    Psoriasis    Transaminitis     Past Surgical History:  Procedure Laterality Date   BIOPSY  07/20/2019   Procedure: BIOPSY;  Surgeon: Daneil Dolin, MD;  [facility];  Service: Endoscopy;;   BIOPSY  08/14/2019   Procedure: BIOPSY;  Surgeon: Thornton Park, MD;  Location: [place];  Service: Gastroenterology;;   COLONOSCOPY     COLONOSCOPY WITH PROPOFOL N/A 07/20/2019   Procedure: COLONOSCOPY WITH PROPOFOL;  Surgeon: Daneil Dolin, MD;  [facility];  Service: Endoscopy;  Laterality: N/A;  10:15am   COLONOSCOPY WITH PROPOFOL N/A 08/14/2019   Procedure: COLONOSCOPY WITH PROPOFOL;  Surgeon: Thornton Park,  MD;  Location: [place];  Service: Gastroenterology;  Laterality: N/A;   IR PARACENTESIS  08/14/2019   IR PARACENTESIS  05/02/2021   POLYPECTOMY  07/20/2019   Procedure: POLYPECTOMY;  Surgeon: Daneil Dolin, MD;  [facility];  Service: Endoscopy;;   SUBMUCOSAL TATTOO INJECTION  08/14/2019   Procedure: SUBMUCOSAL TATTOO INJECTION;  Surgeon: Thornton Park, MD;  Location: [place];  Service: Gastroenterology;;    Current Outpatient Medications  Medication Sig Dispense Refill   feeding supplement (ENSURE ENLIVE / ENSURE PLUS) LIQD Take 237 mLs by mouth 3 (three) times daily between meals. 440 mL 12   folic acid (FOLVITE) 1 MG tablet Take 1 tablet (1 mg total) by mouth daily. 30 tablet 3   lactulose (CHRONULAC) 10 GM/15ML solution Take 30 mLs (20 g total) by mouth daily. 473 mL 2   Multiple Vitamins-Minerals (MULTIVITAMIN WITH MINERALS) tablet Take 1 tablet by mouth daily. 120 tablet 2   ondansetron (ZOFRAN) 4 MG tablet Take 1 tablet (4 mg total) by mouth every 6 (six) hours as needed for nausea. 20 tablet 0   pantoprazole (PROTONIX) 40 MG tablet Take 1 tablet (40 mg total) by mouth 2 (two) times daily before a meal. 60 tablet 2   potassium chloride SA (KLOR-CON M) 20 MEQ tablet TAKE 1 TABLET BY MOUTH DAILY 30 tablet 0   rifaximin (XIFAXAN) 550 MG TABS tablet Take 1 tablet (550 mg total) by mouth 2 (two) times daily. 90 tablet 2   thiamine 100 MG tablet Take 1 tablet (100 mg total) by mouth daily. 30 tablet 2   traMADol (ULTRAM) 50 MG tablet TAKE 1 TABLET(50 MG) BY MOUTH EVERY 6 HOURS AS NEEDED 30 tablet 0   No current facility-administered medications for this visit.    Allergies as of 05/30/2021 - Review Complete 05/26/2021  Allergen Reaction Noted   Oxycodone Itching 04/21/2021    Family History  Problem Relation Age of Onset   Asthma Father    Cancer Sister 64       Pharyngeal   Colon cancer Neg Hx    Liver disease Neg Hx    Esophageal cancer Neg Hx     Rectal cancer Neg Hx    Stomach cancer Neg Hx       Physical Exam: General:   Alert,  well-nourished, pleasant and cooperative in NAD Head:  Normocephalic and atraumatic. Eyes:  Sclera clear, no icterus.   Conjunctiva pink. Ears:  Normal auditory acuity. Nose:  No deformity, discharge,  or lesions. Mouth:  No deformity or lesions.   Neck:  Supple; no masses or thyromegaly. Lungs:  Clear throughout to auscultation.   No wheezes. Heart:  Regular rate and rhythm; no murmurs. Abdomen:  Soft, nontender, nondistended, normal bowel sounds, no rebound or guarding. No hepatosplenomegaly.   Rectal:  Deferred  Msk:  Symmetrical. No boney deformities LAD: No inguinal or umbilical LAD Extremities:  No clubbing or edema. Neurologic:  Alert and  oriented x4;  grossly nonfocal Skin:  Intact without significant lesions or rashes. Psych:  Alert and cooperative. Normal mood and affect.   Lab  Results: BMET Recent Labs    05/29/21 0824  NA 138  K 3.5  CL 104  CO2 24  GLUCOSE 105*  BUN 15  CREATININE 0.92  CALCIUM 8.4*   LFT Recent Labs    05/29/21 0824  PROT 5.7*  AST 48*  ALT 59*  BILITOT 6.8*    Esperansa Sarabia L. Tarri Glenn, MD, MPH 05/30/2021, 3:07 PM

## 2021-05-31 DIAGNOSIS — R69 Illness, unspecified: Secondary | ICD-10-CM | POA: Diagnosis not present

## 2021-05-31 DIAGNOSIS — D649 Anemia, unspecified: Secondary | ICD-10-CM | POA: Diagnosis not present

## 2021-05-31 DIAGNOSIS — D689 Coagulation defect, unspecified: Secondary | ICD-10-CM | POA: Diagnosis not present

## 2021-05-31 DIAGNOSIS — K2980 Duodenitis without bleeding: Secondary | ICD-10-CM | POA: Diagnosis not present

## 2021-05-31 DIAGNOSIS — K767 Hepatorenal syndrome: Secondary | ICD-10-CM | POA: Diagnosis not present

## 2021-05-31 DIAGNOSIS — K319 Disease of stomach and duodenum, unspecified: Secondary | ICD-10-CM | POA: Diagnosis not present

## 2021-05-31 DIAGNOSIS — Z8616 Personal history of COVID-19: Secondary | ICD-10-CM | POA: Diagnosis not present

## 2021-05-31 DIAGNOSIS — F10188 Alcohol abuse with other alcohol-induced disorder: Secondary | ICD-10-CM | POA: Diagnosis not present

## 2021-05-31 DIAGNOSIS — K7010 Alcoholic hepatitis without ascites: Secondary | ICD-10-CM | POA: Diagnosis not present

## 2021-06-01 DIAGNOSIS — K2980 Duodenitis without bleeding: Secondary | ICD-10-CM | POA: Diagnosis not present

## 2021-06-01 DIAGNOSIS — K319 Disease of stomach and duodenum, unspecified: Secondary | ICD-10-CM | POA: Diagnosis not present

## 2021-06-01 DIAGNOSIS — K7010 Alcoholic hepatitis without ascites: Secondary | ICD-10-CM | POA: Diagnosis not present

## 2021-06-01 DIAGNOSIS — F10188 Alcohol abuse with other alcohol-induced disorder: Secondary | ICD-10-CM | POA: Diagnosis not present

## 2021-06-01 DIAGNOSIS — K767 Hepatorenal syndrome: Secondary | ICD-10-CM | POA: Diagnosis not present

## 2021-06-01 DIAGNOSIS — Z8616 Personal history of COVID-19: Secondary | ICD-10-CM | POA: Diagnosis not present

## 2021-06-01 DIAGNOSIS — R69 Illness, unspecified: Secondary | ICD-10-CM | POA: Diagnosis not present

## 2021-06-01 DIAGNOSIS — D649 Anemia, unspecified: Secondary | ICD-10-CM | POA: Diagnosis not present

## 2021-06-01 DIAGNOSIS — D689 Coagulation defect, unspecified: Secondary | ICD-10-CM | POA: Diagnosis not present

## 2021-06-02 DIAGNOSIS — K767 Hepatorenal syndrome: Secondary | ICD-10-CM | POA: Diagnosis not present

## 2021-06-02 DIAGNOSIS — K319 Disease of stomach and duodenum, unspecified: Secondary | ICD-10-CM | POA: Diagnosis not present

## 2021-06-02 DIAGNOSIS — D649 Anemia, unspecified: Secondary | ICD-10-CM | POA: Diagnosis not present

## 2021-06-02 DIAGNOSIS — F10188 Alcohol abuse with other alcohol-induced disorder: Secondary | ICD-10-CM | POA: Diagnosis not present

## 2021-06-02 DIAGNOSIS — K7010 Alcoholic hepatitis without ascites: Secondary | ICD-10-CM | POA: Diagnosis not present

## 2021-06-02 DIAGNOSIS — Z8616 Personal history of COVID-19: Secondary | ICD-10-CM | POA: Diagnosis not present

## 2021-06-02 DIAGNOSIS — K2980 Duodenitis without bleeding: Secondary | ICD-10-CM | POA: Diagnosis not present

## 2021-06-02 DIAGNOSIS — D689 Coagulation defect, unspecified: Secondary | ICD-10-CM | POA: Diagnosis not present

## 2021-06-02 DIAGNOSIS — R69 Illness, unspecified: Secondary | ICD-10-CM | POA: Diagnosis not present

## 2021-06-03 DIAGNOSIS — D689 Coagulation defect, unspecified: Secondary | ICD-10-CM | POA: Diagnosis not present

## 2021-06-03 DIAGNOSIS — K2980 Duodenitis without bleeding: Secondary | ICD-10-CM | POA: Diagnosis not present

## 2021-06-03 DIAGNOSIS — K767 Hepatorenal syndrome: Secondary | ICD-10-CM | POA: Diagnosis not present

## 2021-06-03 DIAGNOSIS — K7010 Alcoholic hepatitis without ascites: Secondary | ICD-10-CM | POA: Diagnosis not present

## 2021-06-03 DIAGNOSIS — K319 Disease of stomach and duodenum, unspecified: Secondary | ICD-10-CM | POA: Diagnosis not present

## 2021-06-03 DIAGNOSIS — D649 Anemia, unspecified: Secondary | ICD-10-CM | POA: Diagnosis not present

## 2021-06-03 DIAGNOSIS — R69 Illness, unspecified: Secondary | ICD-10-CM | POA: Diagnosis not present

## 2021-06-03 DIAGNOSIS — Z8616 Personal history of COVID-19: Secondary | ICD-10-CM | POA: Diagnosis not present

## 2021-06-03 DIAGNOSIS — F10188 Alcohol abuse with other alcohol-induced disorder: Secondary | ICD-10-CM | POA: Diagnosis not present

## 2021-06-03 IMAGING — CT CT ABD-PELV W/ CM
2 of 5 series · 16 of 46 positions shown, 18 images · IV contrast (Omnipaque or Isovue)
Comparison: 02/02/2019

CLINICAL DATA: Right-sided abdominal pain, nausea and vomiting,
elevated liver enzymes

EXAM:
CT ABDOMEN AND PELVIS WITH CONTRAST
TECHNIQUE: Multidetector CT imaging of the abdomen and pelvis was performed
using the standard protocol following bolus administration of
intravenous contrast.
CONTRAST:  100mL OMNIPAQUE IOHEXOL 300 MG/ML  SOLN

[Series 2: axial st · axial · 0.78mm/px · z∈[+766,+1222]mm · 13 of 106 slices shown, 15 images]
[im 8/106  soft-tissue]
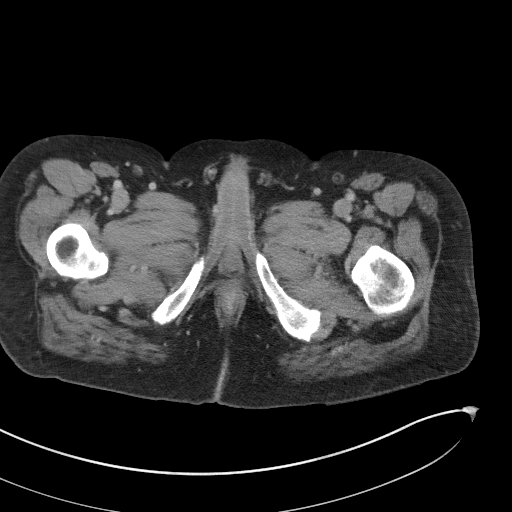
[im 8/106  bone]
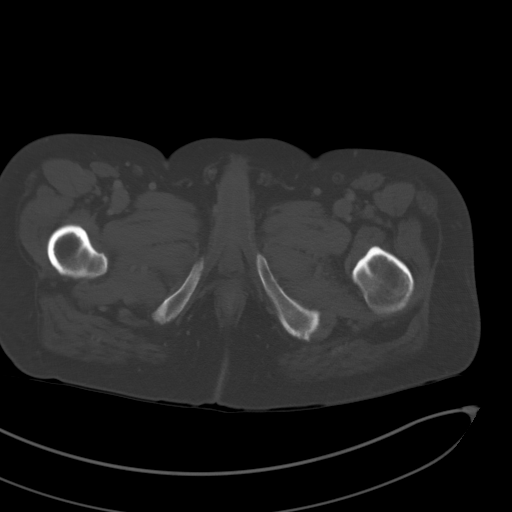
[im 15/106  soft-tissue]
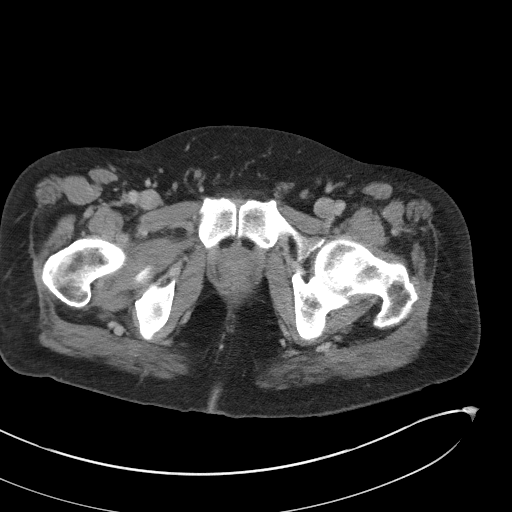
[im 22/106  soft-tissue]
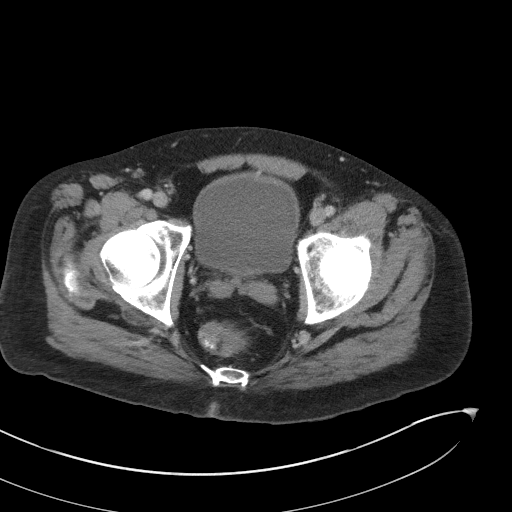
[im 29/106  soft-tissue]
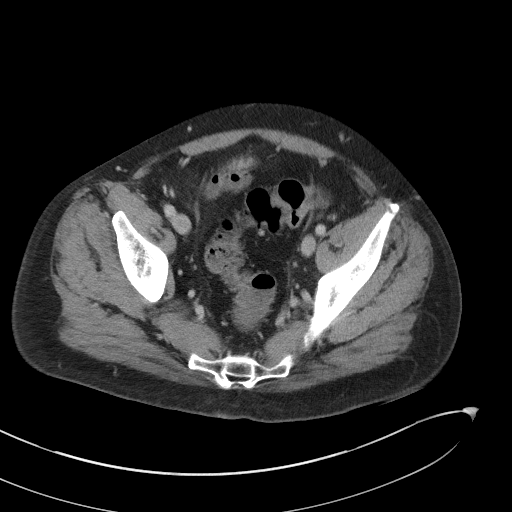
[im 36/106  soft-tissue]
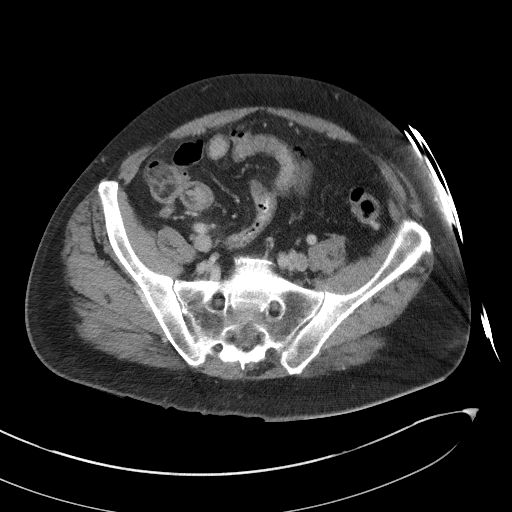
[im 43/106  soft-tissue]
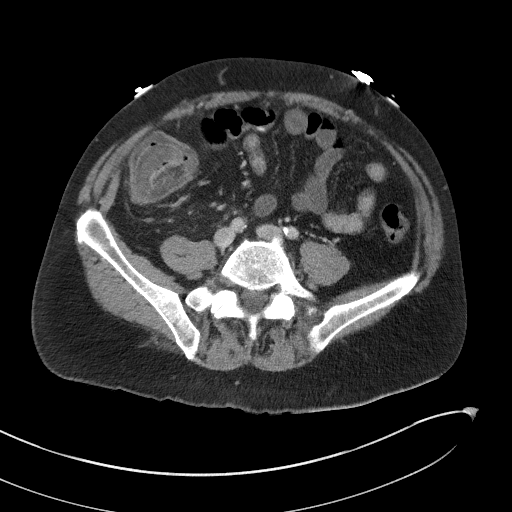
[im 57/106  soft-tissue]
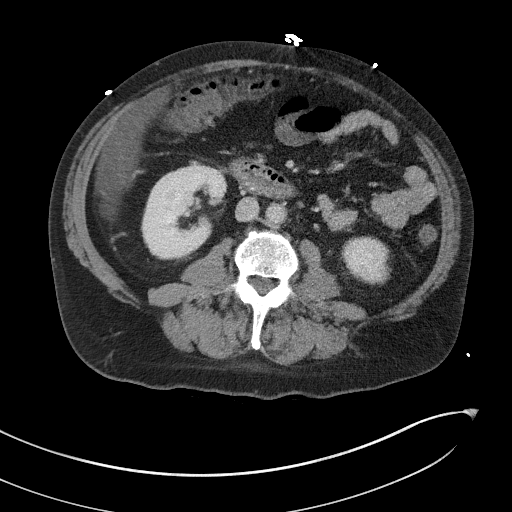
[im 64/106  soft-tissue]
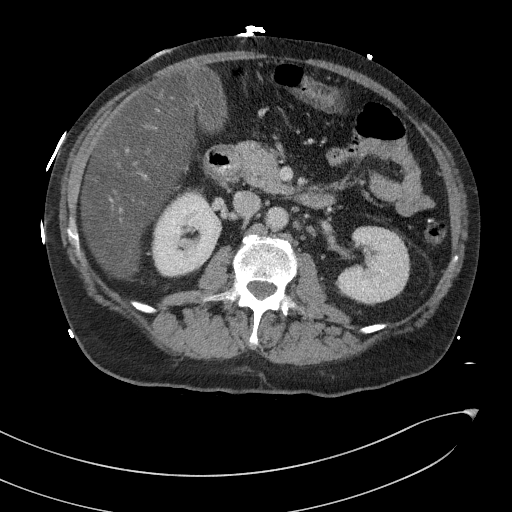
[im 71/106  soft-tissue]
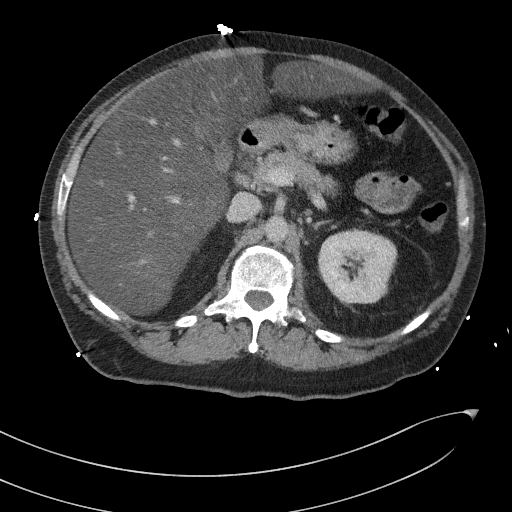
[im 71/106  bone]
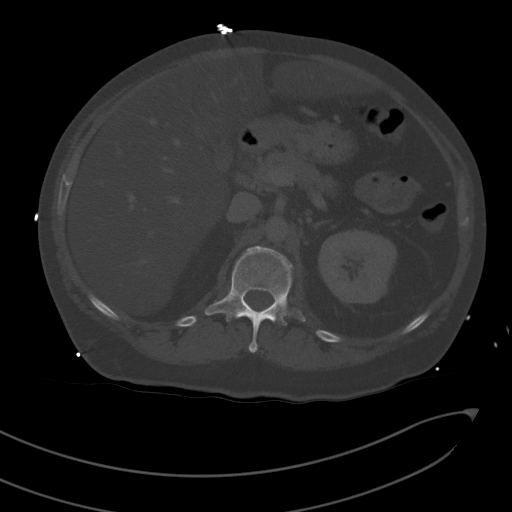
[im 78/106  soft-tissue]
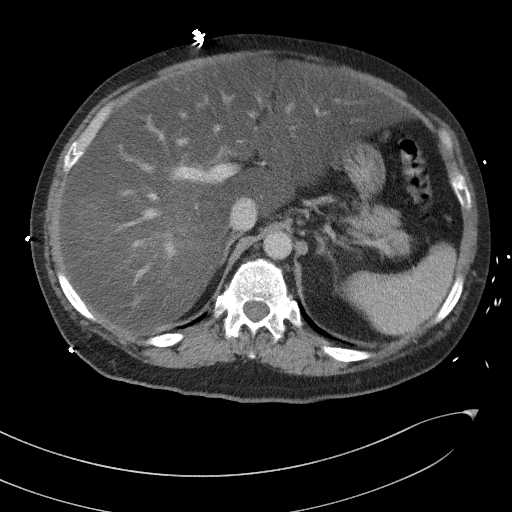
[im 85/106  soft-tissue]
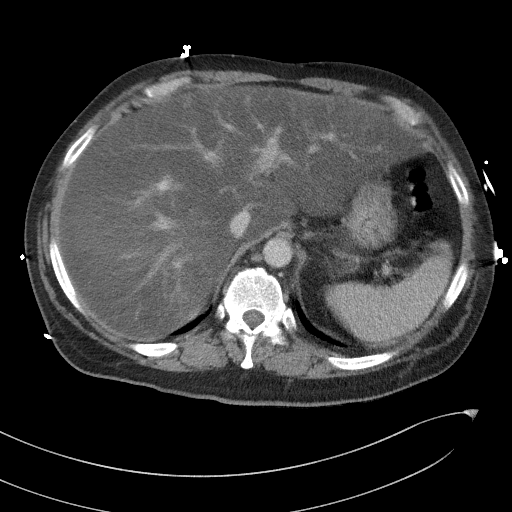
[im 92/106  soft-tissue]
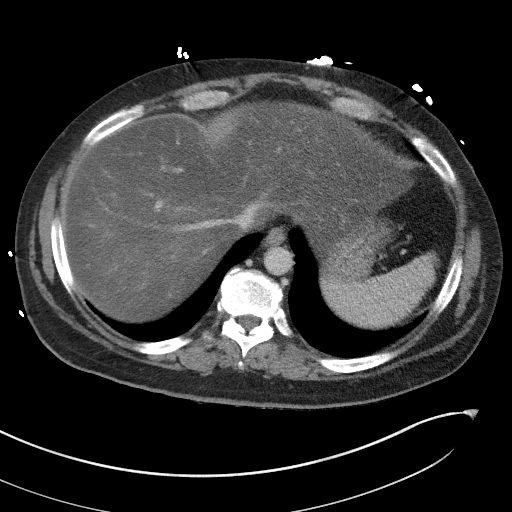
[im 99/106  soft-tissue]
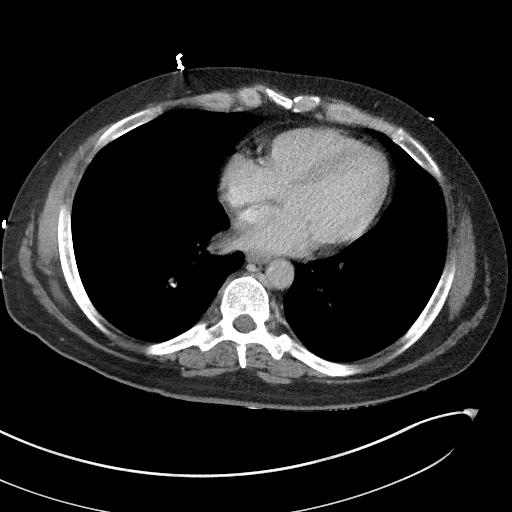

[Series 5: coronal st · coronal · 0.83mm/px · 3 of 123 slices shown]
[im 41/123  soft-tissue]
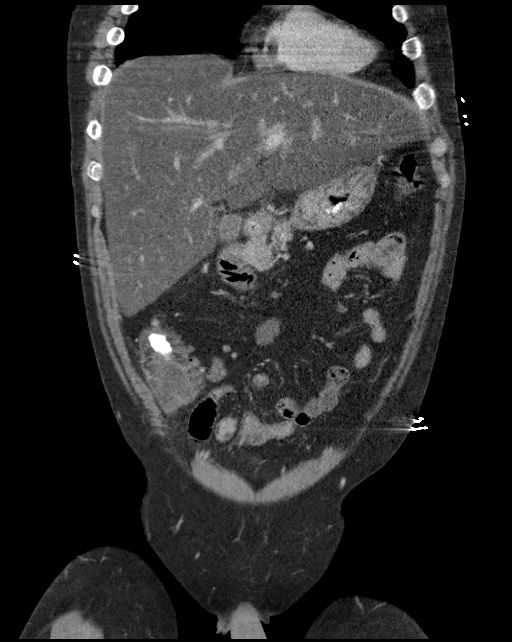
[im 55/123  soft-tissue]
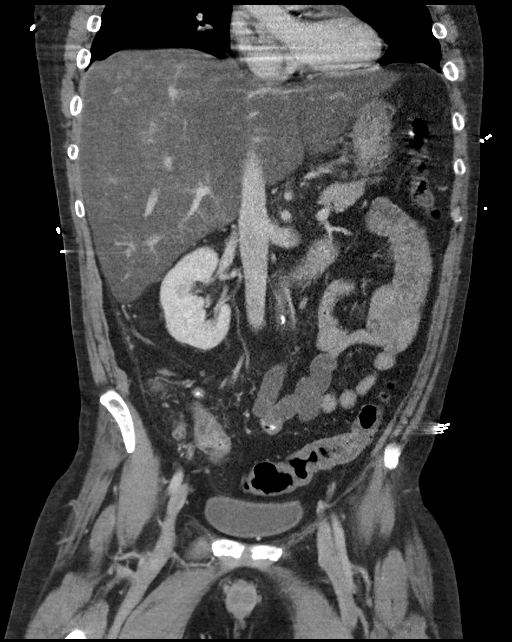
[im 68/123  soft-tissue]
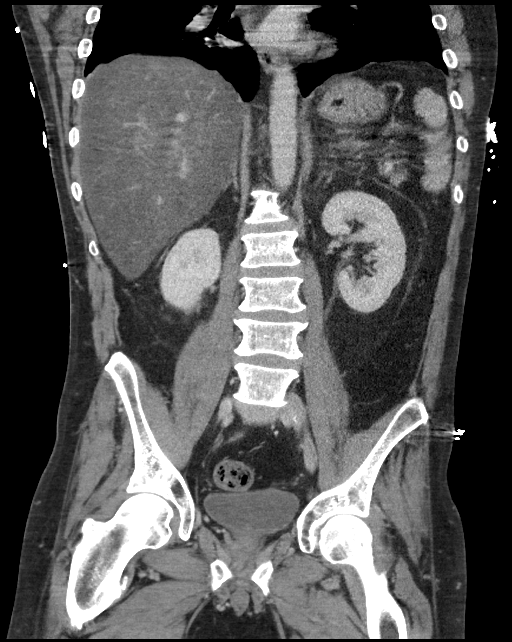

[16 of 46 positions shown; findings below may reference images not displayed]

FINDINGS: Lower chest: No acute pleural or parenchymal lung disease.

Hepatobiliary: Diffuse hepatic steatosis without focal abnormality.
The gallbladder is unremarkable.

Pancreas: Unremarkable. No pancreatic ductal dilatation or
surrounding inflammatory changes.

Spleen: Normal in size without focal abnormality.

Adrenals/Urinary Tract: Adrenal glands are unremarkable. Kidneys are
normal, without renal calculi, focal lesion, or hydronephrosis.
Bladder is unremarkable.

Stomach/Bowel: A normal retrocecal appendix is identified.

There is masslike thickening within the cecum at the ileocecal
valve, measuring 4.4 x 3.1 x 4.8 cm. Neoplasm cannot be excluded,
and colonoscopy is recommended for further evaluation.

There is no bowel obstruction or ileus. Minimal distal colonic
diverticulosis without diverticulitis.

Vascular/Lymphatic: Aortic atherosclerosis. No enlarged abdominal or
pelvic lymph nodes.

Reproductive: Prostate is unremarkable.

Other: There several subcentimeter lymph nodes in the right lower
quadrant mesentery, nonspecific. I do not see any pathologic
adenopathy within the abdomen or pelvis. No free intra-abdominal
gas.

Musculoskeletal: No acute or destructive bony lesions. Reconstructed
images demonstrate no additional findings.
IMPRESSION: 1. Cecal mass at the level of the ileocecal valve, concerning for
neoplasm. Colonoscopy is recommended for further evaluation.
2. Diffuse hepatic steatosis without focal abnormality.

## 2021-06-04 ENCOUNTER — Encounter: Payer: Self-pay | Admitting: Gastroenterology

## 2021-06-04 DIAGNOSIS — Z8616 Personal history of COVID-19: Secondary | ICD-10-CM | POA: Diagnosis not present

## 2021-06-04 DIAGNOSIS — K2980 Duodenitis without bleeding: Secondary | ICD-10-CM | POA: Diagnosis not present

## 2021-06-04 DIAGNOSIS — R69 Illness, unspecified: Secondary | ICD-10-CM | POA: Diagnosis not present

## 2021-06-04 DIAGNOSIS — K767 Hepatorenal syndrome: Secondary | ICD-10-CM | POA: Diagnosis not present

## 2021-06-04 DIAGNOSIS — K7010 Alcoholic hepatitis without ascites: Secondary | ICD-10-CM | POA: Diagnosis not present

## 2021-06-04 DIAGNOSIS — D689 Coagulation defect, unspecified: Secondary | ICD-10-CM | POA: Diagnosis not present

## 2021-06-04 DIAGNOSIS — D649 Anemia, unspecified: Secondary | ICD-10-CM | POA: Diagnosis not present

## 2021-06-04 DIAGNOSIS — F10188 Alcohol abuse with other alcohol-induced disorder: Secondary | ICD-10-CM | POA: Diagnosis not present

## 2021-06-04 DIAGNOSIS — K319 Disease of stomach and duodenum, unspecified: Secondary | ICD-10-CM | POA: Diagnosis not present

## 2021-06-05 ENCOUNTER — Telehealth: Payer: Self-pay

## 2021-06-05 DIAGNOSIS — D689 Coagulation defect, unspecified: Secondary | ICD-10-CM | POA: Diagnosis not present

## 2021-06-05 DIAGNOSIS — K319 Disease of stomach and duodenum, unspecified: Secondary | ICD-10-CM | POA: Diagnosis not present

## 2021-06-05 DIAGNOSIS — D649 Anemia, unspecified: Secondary | ICD-10-CM | POA: Diagnosis not present

## 2021-06-05 DIAGNOSIS — K7010 Alcoholic hepatitis without ascites: Secondary | ICD-10-CM | POA: Diagnosis not present

## 2021-06-05 DIAGNOSIS — K2980 Duodenitis without bleeding: Secondary | ICD-10-CM | POA: Diagnosis not present

## 2021-06-05 DIAGNOSIS — K767 Hepatorenal syndrome: Secondary | ICD-10-CM | POA: Diagnosis not present

## 2021-06-05 DIAGNOSIS — F10188 Alcohol abuse with other alcohol-induced disorder: Secondary | ICD-10-CM | POA: Diagnosis not present

## 2021-06-05 DIAGNOSIS — R69 Illness, unspecified: Secondary | ICD-10-CM | POA: Diagnosis not present

## 2021-06-05 DIAGNOSIS — Z8616 Personal history of COVID-19: Secondary | ICD-10-CM | POA: Diagnosis not present

## 2021-06-05 NOTE — Telephone Encounter (Signed)
Felicia,RN w/[facility] called to advise she had a home visit with the patient and he is having increased BLE edema. She did not report any other symptoms.  Please advise, thanks!

## 2021-06-05 NOTE — Telephone Encounter (Signed)
LVM for Christopher Carrillo with recommendations.  Spoke with patient and advised of increase in medication. Appt for next week scheduled.

## 2021-06-06 ENCOUNTER — Telehealth: Payer: Self-pay | Admitting: Family Medicine

## 2021-06-06 DIAGNOSIS — K767 Hepatorenal syndrome: Secondary | ICD-10-CM | POA: Diagnosis not present

## 2021-06-06 DIAGNOSIS — D689 Coagulation defect, unspecified: Secondary | ICD-10-CM | POA: Diagnosis not present

## 2021-06-06 DIAGNOSIS — K7010 Alcoholic hepatitis without ascites: Secondary | ICD-10-CM | POA: Diagnosis not present

## 2021-06-06 DIAGNOSIS — D649 Anemia, unspecified: Secondary | ICD-10-CM | POA: Diagnosis not present

## 2021-06-06 DIAGNOSIS — R69 Illness, unspecified: Secondary | ICD-10-CM | POA: Diagnosis not present

## 2021-06-06 DIAGNOSIS — Z8616 Personal history of COVID-19: Secondary | ICD-10-CM | POA: Diagnosis not present

## 2021-06-06 DIAGNOSIS — K2980 Duodenitis without bleeding: Secondary | ICD-10-CM | POA: Diagnosis not present

## 2021-06-06 DIAGNOSIS — F10188 Alcohol abuse with other alcohol-induced disorder: Secondary | ICD-10-CM | POA: Diagnosis not present

## 2021-06-06 DIAGNOSIS — K319 Disease of stomach and duodenum, unspecified: Secondary | ICD-10-CM | POA: Diagnosis not present

## 2021-06-06 NOTE — Telephone Encounter (Signed)
Wife called states that Dr. Modena Nunnery is requesting lab work and they normally come here to get that done. She knows it is a BMP but not sure what the other labs are.  Please advise if you will put in order so it can be drawn here. She states this is for his GI doctor and that they have been coming weekly for this.  CB# [phone]

## 2021-06-07 ENCOUNTER — Other Ambulatory Visit: Payer: Self-pay | Admitting: Nurse Practitioner

## 2021-06-07 DIAGNOSIS — F10188 Alcohol abuse with other alcohol-induced disorder: Secondary | ICD-10-CM | POA: Diagnosis not present

## 2021-06-07 DIAGNOSIS — K319 Disease of stomach and duodenum, unspecified: Secondary | ICD-10-CM | POA: Diagnosis not present

## 2021-06-07 DIAGNOSIS — K2980 Duodenitis without bleeding: Secondary | ICD-10-CM | POA: Diagnosis not present

## 2021-06-07 DIAGNOSIS — K7010 Alcoholic hepatitis without ascites: Secondary | ICD-10-CM | POA: Diagnosis not present

## 2021-06-07 DIAGNOSIS — R69 Illness, unspecified: Secondary | ICD-10-CM | POA: Diagnosis not present

## 2021-06-07 DIAGNOSIS — K767 Hepatorenal syndrome: Secondary | ICD-10-CM | POA: Diagnosis not present

## 2021-06-07 DIAGNOSIS — D689 Coagulation defect, unspecified: Secondary | ICD-10-CM | POA: Diagnosis not present

## 2021-06-07 DIAGNOSIS — K7030 Alcoholic cirrhosis of liver without ascites: Secondary | ICD-10-CM

## 2021-06-07 DIAGNOSIS — Z8616 Personal history of COVID-19: Secondary | ICD-10-CM | POA: Diagnosis not present

## 2021-06-07 DIAGNOSIS — D649 Anemia, unspecified: Secondary | ICD-10-CM | POA: Diagnosis not present

## 2021-06-07 NOTE — Telephone Encounter (Signed)
Spoke with patient's wife and relayed that Dr. Dennard Schaumann has said he does not see need for weekly labs with our office at this time. Recommended patient keep upcoming appt with GI as they are adjusting his medications and monitoring his labs. She voiced understanding. Nothing further needed at this time.  ? ?

## 2021-06-08 ENCOUNTER — Other Ambulatory Visit (INDEPENDENT_AMBULATORY_CARE_PROVIDER_SITE_OTHER): Payer: Self-pay | Admitting: Family Medicine

## 2021-06-08 ENCOUNTER — Telehealth: Payer: Self-pay

## 2021-06-08 DIAGNOSIS — R69 Illness, unspecified: Secondary | ICD-10-CM | POA: Diagnosis not present

## 2021-06-08 DIAGNOSIS — Z8616 Personal history of COVID-19: Secondary | ICD-10-CM | POA: Diagnosis not present

## 2021-06-08 DIAGNOSIS — K2980 Duodenitis without bleeding: Secondary | ICD-10-CM | POA: Diagnosis not present

## 2021-06-08 DIAGNOSIS — D689 Coagulation defect, unspecified: Secondary | ICD-10-CM | POA: Diagnosis not present

## 2021-06-08 DIAGNOSIS — F10188 Alcohol abuse with other alcohol-induced disorder: Secondary | ICD-10-CM | POA: Diagnosis not present

## 2021-06-08 DIAGNOSIS — K767 Hepatorenal syndrome: Secondary | ICD-10-CM | POA: Diagnosis not present

## 2021-06-08 DIAGNOSIS — D649 Anemia, unspecified: Secondary | ICD-10-CM | POA: Diagnosis not present

## 2021-06-08 DIAGNOSIS — K7010 Alcoholic hepatitis without ascites: Secondary | ICD-10-CM | POA: Diagnosis not present

## 2021-06-08 DIAGNOSIS — K319 Disease of stomach and duodenum, unspecified: Secondary | ICD-10-CM | POA: Diagnosis not present

## 2021-06-08 NOTE — Telephone Encounter (Signed)
Received call from Sidney with [facility] [facility] following on the status of pt disability forms. Asking forms be faxed to [phone]. May return call at [phone]. Routing this message to Dr. Tarri Glenn CMA to best address this request. ?

## 2021-06-08 NOTE — Telephone Encounter (Signed)
Did you discuss the disability forms with patient at his last visit? ?

## 2021-06-08 NOTE — Telephone Encounter (Signed)
Left message for Christopher Carrillo at [facility] to call me back to discuss this issue.  ?

## 2021-06-09 ENCOUNTER — Telehealth: Payer: Self-pay | Admitting: Family Medicine

## 2021-06-09 DIAGNOSIS — K2980 Duodenitis without bleeding: Secondary | ICD-10-CM | POA: Diagnosis not present

## 2021-06-09 DIAGNOSIS — K7010 Alcoholic hepatitis without ascites: Secondary | ICD-10-CM | POA: Diagnosis not present

## 2021-06-09 DIAGNOSIS — K319 Disease of stomach and duodenum, unspecified: Secondary | ICD-10-CM | POA: Diagnosis not present

## 2021-06-09 DIAGNOSIS — R69 Illness, unspecified: Secondary | ICD-10-CM | POA: Diagnosis not present

## 2021-06-09 DIAGNOSIS — D689 Coagulation defect, unspecified: Secondary | ICD-10-CM | POA: Diagnosis not present

## 2021-06-09 DIAGNOSIS — D649 Anemia, unspecified: Secondary | ICD-10-CM | POA: Diagnosis not present

## 2021-06-09 DIAGNOSIS — F10188 Alcohol abuse with other alcohol-induced disorder: Secondary | ICD-10-CM | POA: Diagnosis not present

## 2021-06-09 DIAGNOSIS — K767 Hepatorenal syndrome: Secondary | ICD-10-CM | POA: Diagnosis not present

## 2021-06-09 DIAGNOSIS — Z8616 Personal history of COVID-19: Secondary | ICD-10-CM | POA: Diagnosis not present

## 2021-06-09 NOTE — Telephone Encounter (Signed)
Spoke with Renee regarding refill. Advised her Dr. Dennard Schaumann is out of the office until next Wednesday. She states she will have Dr. Lyman Speller send in refill of Tramadol. She also states she has had multiple conversations with the patient regarding lab draws, and that Hospice will no longer be providing lab orders for him. She wanted to make sure we were aware. I let her know I have also had conversations with the patient and his wife about labs, and that we have recommended he follow with GI for continued care and labs. She will let Dr. Lyman Speller know this. Nothing further needed at this time.  ? ?

## 2021-06-09 NOTE — Telephone Encounter (Signed)
Received call from Eleanora Neighbor, RN with [facility] to request refill of traMADol (ULTRAM) 50 MG tablet [[id number]]  ? ?Pharmacy listed as ? ?[facility] [phone] - [place], [place] [address]  ?[address], [place] [address]  ?Phone:  [phone]  Fax:  [phone]  ?DEA #:  [id number] ? ?Patient completely out of medication.  ? ?Please advise Renee at [phone]. ?

## 2021-06-10 DIAGNOSIS — R69 Illness, unspecified: Secondary | ICD-10-CM | POA: Diagnosis not present

## 2021-06-10 DIAGNOSIS — Z8616 Personal history of COVID-19: Secondary | ICD-10-CM | POA: Diagnosis not present

## 2021-06-10 DIAGNOSIS — K7010 Alcoholic hepatitis without ascites: Secondary | ICD-10-CM | POA: Diagnosis not present

## 2021-06-10 DIAGNOSIS — K2980 Duodenitis without bleeding: Secondary | ICD-10-CM | POA: Diagnosis not present

## 2021-06-10 DIAGNOSIS — K319 Disease of stomach and duodenum, unspecified: Secondary | ICD-10-CM | POA: Diagnosis not present

## 2021-06-10 DIAGNOSIS — K767 Hepatorenal syndrome: Secondary | ICD-10-CM | POA: Diagnosis not present

## 2021-06-10 DIAGNOSIS — D689 Coagulation defect, unspecified: Secondary | ICD-10-CM | POA: Diagnosis not present

## 2021-06-10 DIAGNOSIS — F10188 Alcohol abuse with other alcohol-induced disorder: Secondary | ICD-10-CM | POA: Diagnosis not present

## 2021-06-10 DIAGNOSIS — D649 Anemia, unspecified: Secondary | ICD-10-CM | POA: Diagnosis not present

## 2021-06-11 DIAGNOSIS — Z8616 Personal history of COVID-19: Secondary | ICD-10-CM | POA: Diagnosis not present

## 2021-06-11 DIAGNOSIS — K2980 Duodenitis without bleeding: Secondary | ICD-10-CM | POA: Diagnosis not present

## 2021-06-11 DIAGNOSIS — D649 Anemia, unspecified: Secondary | ICD-10-CM | POA: Diagnosis not present

## 2021-06-11 DIAGNOSIS — K319 Disease of stomach and duodenum, unspecified: Secondary | ICD-10-CM | POA: Diagnosis not present

## 2021-06-11 DIAGNOSIS — R69 Illness, unspecified: Secondary | ICD-10-CM | POA: Diagnosis not present

## 2021-06-11 DIAGNOSIS — K7010 Alcoholic hepatitis without ascites: Secondary | ICD-10-CM | POA: Diagnosis not present

## 2021-06-11 DIAGNOSIS — D689 Coagulation defect, unspecified: Secondary | ICD-10-CM | POA: Diagnosis not present

## 2021-06-11 DIAGNOSIS — F10188 Alcohol abuse with other alcohol-induced disorder: Secondary | ICD-10-CM | POA: Diagnosis not present

## 2021-06-11 DIAGNOSIS — K767 Hepatorenal syndrome: Secondary | ICD-10-CM | POA: Diagnosis not present

## 2021-06-12 DIAGNOSIS — K767 Hepatorenal syndrome: Secondary | ICD-10-CM | POA: Diagnosis not present

## 2021-06-12 DIAGNOSIS — D689 Coagulation defect, unspecified: Secondary | ICD-10-CM | POA: Diagnosis not present

## 2021-06-12 DIAGNOSIS — K319 Disease of stomach and duodenum, unspecified: Secondary | ICD-10-CM | POA: Diagnosis not present

## 2021-06-12 DIAGNOSIS — R69 Illness, unspecified: Secondary | ICD-10-CM | POA: Diagnosis not present

## 2021-06-12 DIAGNOSIS — K7010 Alcoholic hepatitis without ascites: Secondary | ICD-10-CM | POA: Diagnosis not present

## 2021-06-12 DIAGNOSIS — D649 Anemia, unspecified: Secondary | ICD-10-CM | POA: Diagnosis not present

## 2021-06-12 DIAGNOSIS — Z8616 Personal history of COVID-19: Secondary | ICD-10-CM | POA: Diagnosis not present

## 2021-06-12 DIAGNOSIS — F10188 Alcohol abuse with other alcohol-induced disorder: Secondary | ICD-10-CM | POA: Diagnosis not present

## 2021-06-12 DIAGNOSIS — K2980 Duodenitis without bleeding: Secondary | ICD-10-CM | POA: Diagnosis not present

## 2021-06-13 DIAGNOSIS — K7010 Alcoholic hepatitis without ascites: Secondary | ICD-10-CM | POA: Diagnosis not present

## 2021-06-13 DIAGNOSIS — F10188 Alcohol abuse with other alcohol-induced disorder: Secondary | ICD-10-CM | POA: Diagnosis not present

## 2021-06-13 DIAGNOSIS — Z8616 Personal history of COVID-19: Secondary | ICD-10-CM | POA: Diagnosis not present

## 2021-06-13 DIAGNOSIS — R69 Illness, unspecified: Secondary | ICD-10-CM | POA: Diagnosis not present

## 2021-06-13 DIAGNOSIS — K319 Disease of stomach and duodenum, unspecified: Secondary | ICD-10-CM | POA: Diagnosis not present

## 2021-06-13 DIAGNOSIS — K2980 Duodenitis without bleeding: Secondary | ICD-10-CM | POA: Diagnosis not present

## 2021-06-13 DIAGNOSIS — D649 Anemia, unspecified: Secondary | ICD-10-CM | POA: Diagnosis not present

## 2021-06-13 DIAGNOSIS — D689 Coagulation defect, unspecified: Secondary | ICD-10-CM | POA: Diagnosis not present

## 2021-06-13 DIAGNOSIS — K767 Hepatorenal syndrome: Secondary | ICD-10-CM | POA: Diagnosis not present

## 2021-06-14 ENCOUNTER — Ambulatory Visit: Payer: No Typology Code available for payment source | Admitting: Family Medicine

## 2021-06-14 DIAGNOSIS — D689 Coagulation defect, unspecified: Secondary | ICD-10-CM | POA: Diagnosis not present

## 2021-06-14 DIAGNOSIS — F10188 Alcohol abuse with other alcohol-induced disorder: Secondary | ICD-10-CM | POA: Diagnosis not present

## 2021-06-14 DIAGNOSIS — K319 Disease of stomach and duodenum, unspecified: Secondary | ICD-10-CM | POA: Diagnosis not present

## 2021-06-14 DIAGNOSIS — K767 Hepatorenal syndrome: Secondary | ICD-10-CM | POA: Diagnosis not present

## 2021-06-14 DIAGNOSIS — K7010 Alcoholic hepatitis without ascites: Secondary | ICD-10-CM | POA: Diagnosis not present

## 2021-06-14 DIAGNOSIS — Z8616 Personal history of COVID-19: Secondary | ICD-10-CM | POA: Diagnosis not present

## 2021-06-14 DIAGNOSIS — K2980 Duodenitis without bleeding: Secondary | ICD-10-CM | POA: Diagnosis not present

## 2021-06-14 DIAGNOSIS — D649 Anemia, unspecified: Secondary | ICD-10-CM | POA: Diagnosis not present

## 2021-06-14 DIAGNOSIS — R69 Illness, unspecified: Secondary | ICD-10-CM | POA: Diagnosis not present

## 2021-06-15 DIAGNOSIS — K319 Disease of stomach and duodenum, unspecified: Secondary | ICD-10-CM | POA: Diagnosis not present

## 2021-06-15 DIAGNOSIS — Z8616 Personal history of COVID-19: Secondary | ICD-10-CM | POA: Diagnosis not present

## 2021-06-15 DIAGNOSIS — D649 Anemia, unspecified: Secondary | ICD-10-CM | POA: Diagnosis not present

## 2021-06-15 DIAGNOSIS — D689 Coagulation defect, unspecified: Secondary | ICD-10-CM | POA: Diagnosis not present

## 2021-06-15 DIAGNOSIS — K2980 Duodenitis without bleeding: Secondary | ICD-10-CM | POA: Diagnosis not present

## 2021-06-15 DIAGNOSIS — R69 Illness, unspecified: Secondary | ICD-10-CM | POA: Diagnosis not present

## 2021-06-15 DIAGNOSIS — K767 Hepatorenal syndrome: Secondary | ICD-10-CM | POA: Diagnosis not present

## 2021-06-15 DIAGNOSIS — K7010 Alcoholic hepatitis without ascites: Secondary | ICD-10-CM | POA: Diagnosis not present

## 2021-06-15 DIAGNOSIS — F10188 Alcohol abuse with other alcohol-induced disorder: Secondary | ICD-10-CM | POA: Diagnosis not present

## 2021-06-16 DIAGNOSIS — K2980 Duodenitis without bleeding: Secondary | ICD-10-CM | POA: Diagnosis not present

## 2021-06-16 DIAGNOSIS — R69 Illness, unspecified: Secondary | ICD-10-CM | POA: Diagnosis not present

## 2021-06-16 DIAGNOSIS — K319 Disease of stomach and duodenum, unspecified: Secondary | ICD-10-CM | POA: Diagnosis not present

## 2021-06-16 DIAGNOSIS — K767 Hepatorenal syndrome: Secondary | ICD-10-CM | POA: Diagnosis not present

## 2021-06-16 DIAGNOSIS — D689 Coagulation defect, unspecified: Secondary | ICD-10-CM | POA: Diagnosis not present

## 2021-06-16 DIAGNOSIS — K7010 Alcoholic hepatitis without ascites: Secondary | ICD-10-CM | POA: Diagnosis not present

## 2021-06-16 DIAGNOSIS — D649 Anemia, unspecified: Secondary | ICD-10-CM | POA: Diagnosis not present

## 2021-06-16 DIAGNOSIS — F10188 Alcohol abuse with other alcohol-induced disorder: Secondary | ICD-10-CM | POA: Diagnosis not present

## 2021-06-16 DIAGNOSIS — Z8616 Personal history of COVID-19: Secondary | ICD-10-CM | POA: Diagnosis not present

## 2021-06-17 DIAGNOSIS — D649 Anemia, unspecified: Secondary | ICD-10-CM | POA: Diagnosis not present

## 2021-06-17 DIAGNOSIS — K319 Disease of stomach and duodenum, unspecified: Secondary | ICD-10-CM | POA: Diagnosis not present

## 2021-06-17 DIAGNOSIS — K2980 Duodenitis without bleeding: Secondary | ICD-10-CM | POA: Diagnosis not present

## 2021-06-17 DIAGNOSIS — R69 Illness, unspecified: Secondary | ICD-10-CM | POA: Diagnosis not present

## 2021-06-17 DIAGNOSIS — Z8616 Personal history of COVID-19: Secondary | ICD-10-CM | POA: Diagnosis not present

## 2021-06-17 DIAGNOSIS — K767 Hepatorenal syndrome: Secondary | ICD-10-CM | POA: Diagnosis not present

## 2021-06-17 DIAGNOSIS — F10188 Alcohol abuse with other alcohol-induced disorder: Secondary | ICD-10-CM | POA: Diagnosis not present

## 2021-06-17 DIAGNOSIS — D689 Coagulation defect, unspecified: Secondary | ICD-10-CM | POA: Diagnosis not present

## 2021-06-17 DIAGNOSIS — K7010 Alcoholic hepatitis without ascites: Secondary | ICD-10-CM | POA: Diagnosis not present

## 2021-06-18 DIAGNOSIS — R69 Illness, unspecified: Secondary | ICD-10-CM | POA: Diagnosis not present

## 2021-06-18 DIAGNOSIS — Z8616 Personal history of COVID-19: Secondary | ICD-10-CM | POA: Diagnosis not present

## 2021-06-18 DIAGNOSIS — K2980 Duodenitis without bleeding: Secondary | ICD-10-CM | POA: Diagnosis not present

## 2021-06-18 DIAGNOSIS — K7010 Alcoholic hepatitis without ascites: Secondary | ICD-10-CM | POA: Diagnosis not present

## 2021-06-18 DIAGNOSIS — K319 Disease of stomach and duodenum, unspecified: Secondary | ICD-10-CM | POA: Diagnosis not present

## 2021-06-18 DIAGNOSIS — D689 Coagulation defect, unspecified: Secondary | ICD-10-CM | POA: Diagnosis not present

## 2021-06-18 DIAGNOSIS — D649 Anemia, unspecified: Secondary | ICD-10-CM | POA: Diagnosis not present

## 2021-06-18 DIAGNOSIS — F10188 Alcohol abuse with other alcohol-induced disorder: Secondary | ICD-10-CM | POA: Diagnosis not present

## 2021-06-18 DIAGNOSIS — K767 Hepatorenal syndrome: Secondary | ICD-10-CM | POA: Diagnosis not present

## 2021-06-19 DIAGNOSIS — K319 Disease of stomach and duodenum, unspecified: Secondary | ICD-10-CM | POA: Diagnosis not present

## 2021-06-19 DIAGNOSIS — Z8616 Personal history of COVID-19: Secondary | ICD-10-CM | POA: Diagnosis not present

## 2021-06-19 DIAGNOSIS — K2980 Duodenitis without bleeding: Secondary | ICD-10-CM | POA: Diagnosis not present

## 2021-06-19 DIAGNOSIS — F10188 Alcohol abuse with other alcohol-induced disorder: Secondary | ICD-10-CM | POA: Diagnosis not present

## 2021-06-19 DIAGNOSIS — D649 Anemia, unspecified: Secondary | ICD-10-CM | POA: Diagnosis not present

## 2021-06-19 DIAGNOSIS — R69 Illness, unspecified: Secondary | ICD-10-CM | POA: Diagnosis not present

## 2021-06-19 DIAGNOSIS — D689 Coagulation defect, unspecified: Secondary | ICD-10-CM | POA: Diagnosis not present

## 2021-06-19 DIAGNOSIS — K767 Hepatorenal syndrome: Secondary | ICD-10-CM | POA: Diagnosis not present

## 2021-06-19 DIAGNOSIS — K7010 Alcoholic hepatitis without ascites: Secondary | ICD-10-CM | POA: Diagnosis not present

## 2021-06-20 DIAGNOSIS — D689 Coagulation defect, unspecified: Secondary | ICD-10-CM | POA: Diagnosis not present

## 2021-06-20 DIAGNOSIS — F10188 Alcohol abuse with other alcohol-induced disorder: Secondary | ICD-10-CM | POA: Diagnosis not present

## 2021-06-20 DIAGNOSIS — K7010 Alcoholic hepatitis without ascites: Secondary | ICD-10-CM | POA: Diagnosis not present

## 2021-06-20 DIAGNOSIS — R69 Illness, unspecified: Secondary | ICD-10-CM | POA: Diagnosis not present

## 2021-06-20 DIAGNOSIS — K767 Hepatorenal syndrome: Secondary | ICD-10-CM | POA: Diagnosis not present

## 2021-06-20 DIAGNOSIS — K319 Disease of stomach and duodenum, unspecified: Secondary | ICD-10-CM | POA: Diagnosis not present

## 2021-06-20 DIAGNOSIS — D649 Anemia, unspecified: Secondary | ICD-10-CM | POA: Diagnosis not present

## 2021-06-20 DIAGNOSIS — Z8616 Personal history of COVID-19: Secondary | ICD-10-CM | POA: Diagnosis not present

## 2021-06-20 DIAGNOSIS — K2980 Duodenitis without bleeding: Secondary | ICD-10-CM | POA: Diagnosis not present

## 2021-06-21 DIAGNOSIS — K319 Disease of stomach and duodenum, unspecified: Secondary | ICD-10-CM | POA: Diagnosis not present

## 2021-06-21 DIAGNOSIS — R69 Illness, unspecified: Secondary | ICD-10-CM | POA: Diagnosis not present

## 2021-06-21 DIAGNOSIS — K2980 Duodenitis without bleeding: Secondary | ICD-10-CM | POA: Diagnosis not present

## 2021-06-21 DIAGNOSIS — D649 Anemia, unspecified: Secondary | ICD-10-CM | POA: Diagnosis not present

## 2021-06-21 DIAGNOSIS — F10188 Alcohol abuse with other alcohol-induced disorder: Secondary | ICD-10-CM | POA: Diagnosis not present

## 2021-06-21 DIAGNOSIS — K7010 Alcoholic hepatitis without ascites: Secondary | ICD-10-CM | POA: Diagnosis not present

## 2021-06-21 DIAGNOSIS — K767 Hepatorenal syndrome: Secondary | ICD-10-CM | POA: Diagnosis not present

## 2021-06-21 DIAGNOSIS — Z8616 Personal history of COVID-19: Secondary | ICD-10-CM | POA: Diagnosis not present

## 2021-06-21 DIAGNOSIS — D689 Coagulation defect, unspecified: Secondary | ICD-10-CM | POA: Diagnosis not present

## 2021-06-22 ENCOUNTER — Other Ambulatory Visit (INDEPENDENT_AMBULATORY_CARE_PROVIDER_SITE_OTHER): Payer: No Typology Code available for payment source

## 2021-06-22 ENCOUNTER — Encounter: Payer: Self-pay | Admitting: Nurse Practitioner

## 2021-06-22 ENCOUNTER — Ambulatory Visit (INDEPENDENT_AMBULATORY_CARE_PROVIDER_SITE_OTHER): Payer: No Typology Code available for payment source | Admitting: Nurse Practitioner

## 2021-06-22 VITALS — BP 124/72 | HR 99 | Ht 68.0 in | Wt 182.0 lb

## 2021-06-22 DIAGNOSIS — K7011 Alcoholic hepatitis with ascites: Secondary | ICD-10-CM | POA: Diagnosis not present

## 2021-06-22 DIAGNOSIS — Z8616 Personal history of COVID-19: Secondary | ICD-10-CM | POA: Diagnosis not present

## 2021-06-22 DIAGNOSIS — K7010 Alcoholic hepatitis without ascites: Secondary | ICD-10-CM | POA: Diagnosis not present

## 2021-06-22 DIAGNOSIS — K319 Disease of stomach and duodenum, unspecified: Secondary | ICD-10-CM | POA: Diagnosis not present

## 2021-06-22 DIAGNOSIS — K7031 Alcoholic cirrhosis of liver with ascites: Secondary | ICD-10-CM

## 2021-06-22 DIAGNOSIS — R140 Abdominal distension (gaseous): Secondary | ICD-10-CM | POA: Diagnosis not present

## 2021-06-22 DIAGNOSIS — D649 Anemia, unspecified: Secondary | ICD-10-CM | POA: Diagnosis not present

## 2021-06-22 DIAGNOSIS — R69 Illness, unspecified: Secondary | ICD-10-CM | POA: Diagnosis not present

## 2021-06-22 DIAGNOSIS — F10188 Alcohol abuse with other alcohol-induced disorder: Secondary | ICD-10-CM | POA: Diagnosis not present

## 2021-06-22 DIAGNOSIS — R1010 Upper abdominal pain, unspecified: Secondary | ICD-10-CM | POA: Diagnosis not present

## 2021-06-22 DIAGNOSIS — D689 Coagulation defect, unspecified: Secondary | ICD-10-CM | POA: Diagnosis not present

## 2021-06-22 DIAGNOSIS — K767 Hepatorenal syndrome: Secondary | ICD-10-CM | POA: Diagnosis not present

## 2021-06-22 DIAGNOSIS — K7469 Other cirrhosis of liver: Secondary | ICD-10-CM | POA: Diagnosis not present

## 2021-06-22 DIAGNOSIS — K2980 Duodenitis without bleeding: Secondary | ICD-10-CM | POA: Diagnosis not present

## 2021-06-22 DIAGNOSIS — K709 Alcoholic liver disease, unspecified: Secondary | ICD-10-CM | POA: Diagnosis not present

## 2021-06-22 LAB — AMMONIA: Ammonia: 95 umol/L — ABNORMAL HIGH (ref 11–35)

## 2021-06-22 LAB — HEPATIC FUNCTION PANEL
ALT: 34 U/L (ref 0–53)
AST: 32 U/L (ref 0–37)
Albumin: 3.9 g/dL (ref 3.5–5.2)
Alkaline Phosphatase: 288 U/L — ABNORMAL HIGH (ref 39–117)
Bilirubin, Direct: 1.2 mg/dL — ABNORMAL HIGH (ref 0.0–0.3)
Total Bilirubin: 2.6 mg/dL — ABNORMAL HIGH (ref 0.2–1.2)
Total Protein: 6.7 g/dL (ref 6.0–8.3)

## 2021-06-22 LAB — PROTIME-INR
INR: 1.1 ratio — ABNORMAL HIGH (ref 0.8–1.0)
Prothrombin Time: 12.5 s (ref 9.6–13.1)

## 2021-06-22 LAB — CBC WITH DIFFERENTIAL/PLATELET
Basophils Absolute: 0.1 10*3/uL (ref 0.0–0.1)
Basophils Relative: 0.8 % (ref 0.0–3.0)
Eosinophils Absolute: 0 10*3/uL (ref 0.0–0.7)
Eosinophils Relative: 0.4 % (ref 0.0–5.0)
HCT: 38.2 % — ABNORMAL LOW (ref 39.0–52.0)
Hemoglobin: 13.1 g/dL (ref 13.0–17.0)
Lymphocytes Relative: 8.9 % — ABNORMAL LOW (ref 12.0–46.0)
Lymphs Abs: 1 10*3/uL (ref 0.7–4.0)
MCHC: 34.2 g/dL (ref 30.0–36.0)
MCV: 98.5 fl (ref 78.0–100.0)
Monocytes Absolute: 1.1 10*3/uL — ABNORMAL HIGH (ref 0.1–1.0)
Monocytes Relative: 10.3 % (ref 3.0–12.0)
Neutro Abs: 8.9 10*3/uL — ABNORMAL HIGH (ref 1.4–7.7)
Neutrophils Relative %: 79.6 % — ABNORMAL HIGH (ref 43.0–77.0)
Platelets: 196 10*3/uL (ref 150.0–400.0)
RBC: 3.88 Mil/uL — ABNORMAL LOW (ref 4.22–5.81)
RDW: 15.3 % (ref 11.5–15.5)
WBC: 11.1 10*3/uL — ABNORMAL HIGH (ref 4.0–10.5)

## 2021-06-22 LAB — COMPREHENSIVE METABOLIC PANEL
ALT: 34 U/L (ref 0–53)
AST: 32 U/L (ref 0–37)
Albumin: 3.9 g/dL (ref 3.5–5.2)
Alkaline Phosphatase: 288 U/L — ABNORMAL HIGH (ref 39–117)
BUN: 12 mg/dL (ref 6–23)
CO2: 26 mEq/L (ref 19–32)
Calcium: 9.4 mg/dL (ref 8.4–10.5)
Chloride: 98 mEq/L (ref 96–112)
Creatinine, Ser: 0.96 mg/dL (ref 0.40–1.50)
GFR: 92.91 mL/min (ref >60.00)
Glucose, Bld: 133 mg/dL — ABNORMAL HIGH (ref 70–99)
Potassium: 4 mEq/L (ref 3.5–5.1)
Sodium: 134 mEq/L — ABNORMAL LOW (ref 135–145)
Total Bilirubin: 2.6 mg/dL — ABNORMAL HIGH (ref 0.2–1.2)
Total Protein: 6.7 g/dL (ref 6.0–8.3)

## 2021-06-22 LAB — BILIRUBIN, DIRECT: Bilirubin, Direct: 1.2 mg/dL — ABNORMAL HIGH (ref 0.0–0.3)

## 2021-06-22 NOTE — Progress Notes (Signed)
? ? ? ?06/22/2021 ?[name] ?[id number] ?1970-11-14 ? ? ?Chief Complaint: Cirrhosis follow up ? ?History of Present Illness: Christopher Carrillo is a 51 year old male with a history of alcohol associated hepatitis and cirrhosis which required hospital admission 1/23 - 05/05/2021 with acute on chronic liver failure and HRS. He was last seen in office by Dr. Tarri Glenn on 05/30/2021.  At that time, he completed 28 days of Prednisone for acute alcoholic hepatitis and he was instructed to decrease prednisone by 5 mg every 2 weeks and to resume Lasix 20 mg daily and spironolactone 25 mg daily.  He was advised to follow-up with [facility] at [facility] to readdress his liver transplant status.  He presents to our office today for further follow-up.  He is accompanied by his wife [place].  He remains abstinent from alcohol since 04/02/2021.  He did not pursue formal alcohol rehab as previously recommended.  He did not follow-up with [facility] NP because [facility] does not accept his health insurance.  He continues to feel a bit foggy at times without any confusion.  No pruritus.  His eyes are less yellow.  He denies having any nausea or vomiting.  No upper or lower abdominal pain.  No abdominal or leg swelling.  He is passing normal formed brown bowel movement daily.  No rectal bleeding or black stools.  Urine is normal yellow color.  He endorses being compliant with taking Lactulose 20 g p.o. daily, Furosemide 20 mg p.o. daily, Spironolactone 25 mg p.o. daily, Pantoprazole 40 mg p.o. twice daily, Thiamine 100 mg daily and he is weaning down Prednisone by 5 mg weekly, currently taking Prednisone 25 mg daily.  No complaints at this time. ? ?CBC Latest Ref Rng & Units 05/26/2021 05/18/2021 05/05/2021  ?WBC 3.8 - 10.8 Thousand/uL 8.8 12.3(H) 13.1(H)  ?Hemoglobin 13.2 - 17.1 g/dL 9.8(L) 10.2(L) 9.3(L)  ?Hematocrit 38.5 - 50.0 % 27.2(L) 28.2(L) 25.9(L)  ?Platelets 140 - 400 Thousand/uL 122(L) 169 181  ?  ?CMP  Latest Ref Rng & Units 05/29/2021 05/26/2021 05/18/2021  ?Glucose 65 - 99 mg/dL 105(H) 139(H) 157(H)  ?BUN 7 - 25 mg/dL 15 17 17   ?Creatinine 0.60 - 1.29 mg/dL 0.92 1.03 1.10  ?Sodium 135 - 146 mmol/L 138 137 137  ?Potassium 3.5 - 5.3 mmol/L 3.5 3.9 3.6  ?Chloride 98 - 110 mmol/L 104 107 107  ?CO2 20 - 32 mmol/L 24 20 19(L)  ?Calcium 8.6 - 10.3 mg/dL 8.4(L) 8.7 8.7  ?Total Protein 6.1 - 8.1 g/dL 5.7(L) 5.2(L) 5.7(L)  ?Total Bilirubin 0.2 - 1.2 mg/dL 6.8(H) 8.5(H) 12.1(H)  ?Alkaline Phos 38 - 126 U/L - - -  ?AST 10 - 40 U/L 48(H) 48(H) 60(H)  ?ALT 9 - 46 U/L 59(H) 64(H) 82(H)  ?  ?INR 1.3 on 05/26/2021. ? ?Ammonia 83 on 04/17/2021 ? ?AFP 2.2 on 04/06/2021 ? ?Abdominal MRI/MRCP with and without contrast 04/04/2021: ?1. Gallbladder is distended with wall thickening without ?cholelithiasis or dilated cystic duct, favored sequela of ?hepatocellular disease. If continued clinical concern for acute ?cholecystitis consider further evaluation with nuclear medicine HIDA ?scan. ?2. No biliary ductal dilation or choledocholithiasis. ?3. Hepatic cirrhosis and diffuse steatosis. No suspicious hepatic ?lesion, however postcontrast imaging is degraded by respiratory ?motion. ?4. Sequela of portal hypertension including trace perihepatic ?ascites and splenomegaly ? ?CTAP without contrast 05/04/2021: ?1. Cirrhosis, mesenteric congestive changes and increased mild ?abdominopelvic free ascites. ?2. Mild splenomegaly and upper limit of normal portal vein caliber ?with small upper  abdominal varices. ?3. Ascending colitis versus changes of portal hypertension with ?portal colopathy. ?4. Gastroenteritis versus changes of portal hypertension or reactive ?wall thickening from the ascites. ?5. Increased changes of body wall anasarca. ?6. Moderately dense layering material newly noted in the gallbladder ?lumen which could be layering dense sludge or gravel , not seen ?previously. ?7. The gallbladder wall slightly thickened, which was seen ?previously  and is probably due to hepatic dysfunction. Correlate ?clinically for early cholecystitis. ?8. Cystitis versus bladder nondistention. ?9. Aortic atherosclerosis. ? ?EGD 03/09/2021: ?- Normal esophagus. ?- Portal hypertensive gastropathy. ?- Gastritis. Biopsied. ?- Erythematous duodenopathy ?- Erythematous duodenopathy. Biopsied. ?- The examination was otherwise normal. ?-2 to 3-year EGD recall ?1. Surgical [P], duodenal biopsies ?FRAGMENTS OF NORMAL DUODENAL MUCOSA. ?THERE ARE NO DIAGNOSTIC FEATURES OF CELIAC DISEASE. ?2. Surgical [P], fundus, gastric antrum, and gastric body ?MILD NONSPECIFIC INFLAMMATION WITH FOCAL INTESTINAL METAPLASIA. ?H. PYLORI ARE NOT IDENTIFIED. ?NEGATIVE FOR DYSPLASIA AND MALIGNANCY ? ?Colonoscopy 08/14/2019: ?- Internal and external hemorrhoids are the likely source of rectal bleeding. ?- Diverticulosis in the entire examined colon. ?- Ulceration with possible residual polyp located in the descending colon. Resected and ?retrieved. Tattooed. ?-Recall colonoscopy 7 years ?- No evidence for active or recent bleeding. ?. COLON, DESCENDING, BIOPSY:  ?- Hyperplastic polyp.  ?- There is no evidence of malignancy.  ? ?Colonoscopy 07/20/2019: ?Diverticulosis in the entire examined colon. ?- One 5 mm polyp in the descending colon, removed with a cold snare. Resected and ?retrieved. Abnormal ileocecal valve with apparent mass as described. Status post biopsy.Marland Kitchen ?Patient also noted to be mildly jaundiced. Recent hepatic profile consistent with alcoholic ?Hepatitis. ?A. COLON, CECAL MASS, BIOPSY:  ?- Benign colonic mucosa with lamina propria edema.  ? ?B. COLON, DESCENDING, POLYPECTOMY  ?- Tubular adenoma.  ?- Negative for high grade dysplasia. ? ? ?Current Outpatient Medications on File Prior to Visit  ?Medication Sig Dispense Refill  ? feeding supplement (ENSURE ENLIVE / ENSURE PLUS) LIQD Take 237 mLs by mouth 3 (three) times daily between meals. 094 mL 12  ? folic acid (FOLVITE) 1 MG tablet Take 1  tablet (1 mg total) by mouth daily. 30 tablet 3  ? lactulose (CHRONULAC) 10 GM/15ML solution Take 30 mLs (20 g total) by mouth daily. 473 mL 2  ? ondansetron (ZOFRAN) 4 MG tablet Take 1 tablet (4 mg total) by mouth every 6 (six) hours as needed for nausea. 20 tablet 0  ? pantoprazole (PROTONIX) 40 MG tablet Take 1 tablet (40 mg total) by mouth 2 (two) times daily before a meal. 60 tablet 2  ? potassium chloride SA (KLOR-CON M) 20 MEQ tablet TAKE 1 TABLET BY MOUTH EVERY DAY 30 tablet 0  ? predniSONE (DELTASONE) 5 MG tablet Take 40 mg daily, then decrease by 5 mg weekly. 150 tablet 3  ? rifaximin (XIFAXAN) 550 MG TABS tablet Take 1 tablet (550 mg total) by mouth 2 (two) times daily. 90 tablet 2  ? spironolactone (ALDACTONE) 25 MG tablet Take 1 tablet (25 mg total) by mouth daily. 30 tablet 5  ? thiamine 100 MG tablet Take 1 tablet (100 mg total) by mouth daily. 30 tablet 2  ? traMADol (ULTRAM) 50 MG tablet TAKE 1 TABLET(50 MG) BY MOUTH EVERY 6 HOURS AS NEEDED 30 tablet 0  ? ?No current facility-administered medications on file prior to visit.  ?Furosemide 20 mg 1 p.o. daily. ? ? ?Allergies  ?Allergen Reactions  ? Oxycodone Itching  ? ?Current Medications, Allergies, Past Medical History, Past Surgical  History, Family History and Social History were reviewed in [employer] record. ? ?Review of Systems:   ?Constitutional: Negative for fever, sweats, chills or weight loss.  ?Respiratory: Negative for shortness of breath.   ?Cardiovascular: Negative for chest pain, palpitations and leg swelling.  ?Gastrointestinal: See HPI.  ?Musculoskeletal: Negative for back pain or muscle aches.  ?Neurological: Negative for dizziness, headaches or paresthesias.  ? ? ?Physical Exam: ?BP 124/72   Pulse 99   Ht 5' 8"  (1.727 m)   Wt 182 lb (82.6 kg)   SpO2 98%   BMI 27.67 kg/m?  ?General: 51 year old male in no acute distress. ?Head: Normocephalic and atraumatic. ?Eyes: Mild scleral icterus.  Conjunctiva pink  . ?Ears: Normal auditory acuity. ?Mouth: Dentition intact. No ulcers or lesions.  ?Lungs: Clear throughout to auscultation. ?Heart: Regular rate and rhythm, no murmur. ?Abdomen: Soft, nontender and nondistended.

## 2021-06-22 NOTE — Patient Instructions (Addendum)
Taper Prednisone as previously prescribed by Dr. Tarri Glenn. ?Continue all other medications as previously prescribed. ?Follow a 2 gram low sodium diet. ?No alcohol ever. ? ?We have scheduled you a follow up with Dr. Tarri Glenn on 08/01/21 at 8:30 am. ? ?Please proceed to the basement level for lab work before leaving today. Press "B" on the elevator. The lab is located at the first door on the left as you exit the elevator. ? ?HEALTHCARE LAWS AND MY CHART RESULTS:  ? ?Due to recent changes in healthcare laws, you may see results of your imaging and/or laboratory studies on MyChart before I have had a chance to review them.  I understand that in some cases there may be results that are confusing or concerning to you. Please understand that not all results are received at the same time and often I may need to interpret multiple results in order to provide you with the best plan of care or course of treatment. Therefore, I ask that you please give me 48 hours to thoroughly review all your results before contacting my office for clarification.  ? ?Thank you for trusting me with your gastrointestinal care!   ? ?Noralyn Pick, CRNP ? ? ? ?BMI: ? ?If you are age 44 or older, your body mass index should be between 23-30. Your Body mass index is 27.67 kg/m?Marland Kitchen If this is out of the aforementioned range listed, please consider follow up with your Primary Care Provider. ? ?If you are age 63 or younger, your body mass index should be between 19-25. Your Body mass index is 27.67 kg/m?Marland Kitchen If this is out of the aformentioned range listed, please consider follow up with your Primary Care Provider.  ? ?MY CHART: ? ?The [facility] providers would like to encourage you to use [facility] to communicate with providers for non-urgent requests or questions.  Due to long hold times on the telephone, sending your provider a message by [facility] may be a faster and more efficient way to get a response.  Please allow 48 business hours for a  response.  Please remember that this is for non-urgent requests.  ? ? ?

## 2021-06-23 DIAGNOSIS — K767 Hepatorenal syndrome: Secondary | ICD-10-CM | POA: Diagnosis not present

## 2021-06-23 DIAGNOSIS — F10188 Alcohol abuse with other alcohol-induced disorder: Secondary | ICD-10-CM | POA: Diagnosis not present

## 2021-06-23 DIAGNOSIS — Z8616 Personal history of COVID-19: Secondary | ICD-10-CM | POA: Diagnosis not present

## 2021-06-23 DIAGNOSIS — D649 Anemia, unspecified: Secondary | ICD-10-CM | POA: Diagnosis not present

## 2021-06-23 DIAGNOSIS — K319 Disease of stomach and duodenum, unspecified: Secondary | ICD-10-CM | POA: Diagnosis not present

## 2021-06-23 DIAGNOSIS — K2980 Duodenitis without bleeding: Secondary | ICD-10-CM | POA: Diagnosis not present

## 2021-06-23 DIAGNOSIS — D689 Coagulation defect, unspecified: Secondary | ICD-10-CM | POA: Diagnosis not present

## 2021-06-23 DIAGNOSIS — K7010 Alcoholic hepatitis without ascites: Secondary | ICD-10-CM | POA: Diagnosis not present

## 2021-06-23 DIAGNOSIS — R69 Illness, unspecified: Secondary | ICD-10-CM | POA: Diagnosis not present

## 2021-06-24 DIAGNOSIS — Z8616 Personal history of COVID-19: Secondary | ICD-10-CM | POA: Diagnosis not present

## 2021-06-24 DIAGNOSIS — D689 Coagulation defect, unspecified: Secondary | ICD-10-CM | POA: Diagnosis not present

## 2021-06-24 DIAGNOSIS — K2980 Duodenitis without bleeding: Secondary | ICD-10-CM | POA: Diagnosis not present

## 2021-06-24 DIAGNOSIS — F10188 Alcohol abuse with other alcohol-induced disorder: Secondary | ICD-10-CM | POA: Diagnosis not present

## 2021-06-24 DIAGNOSIS — K767 Hepatorenal syndrome: Secondary | ICD-10-CM | POA: Diagnosis not present

## 2021-06-24 DIAGNOSIS — K319 Disease of stomach and duodenum, unspecified: Secondary | ICD-10-CM | POA: Diagnosis not present

## 2021-06-24 DIAGNOSIS — R69 Illness, unspecified: Secondary | ICD-10-CM | POA: Diagnosis not present

## 2021-06-24 DIAGNOSIS — K7010 Alcoholic hepatitis without ascites: Secondary | ICD-10-CM | POA: Diagnosis not present

## 2021-06-24 DIAGNOSIS — D649 Anemia, unspecified: Secondary | ICD-10-CM | POA: Diagnosis not present

## 2021-06-25 DIAGNOSIS — K7010 Alcoholic hepatitis without ascites: Secondary | ICD-10-CM | POA: Diagnosis not present

## 2021-06-25 DIAGNOSIS — F10188 Alcohol abuse with other alcohol-induced disorder: Secondary | ICD-10-CM | POA: Diagnosis not present

## 2021-06-25 DIAGNOSIS — K319 Disease of stomach and duodenum, unspecified: Secondary | ICD-10-CM | POA: Diagnosis not present

## 2021-06-25 DIAGNOSIS — Z8616 Personal history of COVID-19: Secondary | ICD-10-CM | POA: Diagnosis not present

## 2021-06-25 DIAGNOSIS — K2980 Duodenitis without bleeding: Secondary | ICD-10-CM | POA: Diagnosis not present

## 2021-06-25 DIAGNOSIS — D649 Anemia, unspecified: Secondary | ICD-10-CM | POA: Diagnosis not present

## 2021-06-25 DIAGNOSIS — K767 Hepatorenal syndrome: Secondary | ICD-10-CM | POA: Diagnosis not present

## 2021-06-25 DIAGNOSIS — R69 Illness, unspecified: Secondary | ICD-10-CM | POA: Diagnosis not present

## 2021-06-25 DIAGNOSIS — D689 Coagulation defect, unspecified: Secondary | ICD-10-CM | POA: Diagnosis not present

## 2021-06-26 ENCOUNTER — Other Ambulatory Visit: Payer: Self-pay

## 2021-06-26 DIAGNOSIS — K767 Hepatorenal syndrome: Secondary | ICD-10-CM | POA: Diagnosis not present

## 2021-06-26 DIAGNOSIS — K7010 Alcoholic hepatitis without ascites: Secondary | ICD-10-CM | POA: Diagnosis not present

## 2021-06-26 DIAGNOSIS — R69 Illness, unspecified: Secondary | ICD-10-CM | POA: Diagnosis not present

## 2021-06-26 DIAGNOSIS — K2980 Duodenitis without bleeding: Secondary | ICD-10-CM | POA: Diagnosis not present

## 2021-06-26 DIAGNOSIS — K7011 Alcoholic hepatitis with ascites: Secondary | ICD-10-CM

## 2021-06-26 DIAGNOSIS — Z8616 Personal history of COVID-19: Secondary | ICD-10-CM | POA: Diagnosis not present

## 2021-06-26 DIAGNOSIS — D689 Coagulation defect, unspecified: Secondary | ICD-10-CM | POA: Diagnosis not present

## 2021-06-26 DIAGNOSIS — F10188 Alcohol abuse with other alcohol-induced disorder: Secondary | ICD-10-CM | POA: Diagnosis not present

## 2021-06-26 DIAGNOSIS — K319 Disease of stomach and duodenum, unspecified: Secondary | ICD-10-CM | POA: Diagnosis not present

## 2021-06-26 DIAGNOSIS — D649 Anemia, unspecified: Secondary | ICD-10-CM | POA: Diagnosis not present

## 2021-06-26 LAB — AFP TUMOR MARKER: AFP-Tumor Marker: 3 ng/mL (ref <6.1)

## 2021-06-27 DIAGNOSIS — D689 Coagulation defect, unspecified: Secondary | ICD-10-CM | POA: Diagnosis not present

## 2021-06-27 DIAGNOSIS — K319 Disease of stomach and duodenum, unspecified: Secondary | ICD-10-CM | POA: Diagnosis not present

## 2021-06-27 DIAGNOSIS — R69 Illness, unspecified: Secondary | ICD-10-CM | POA: Diagnosis not present

## 2021-06-27 DIAGNOSIS — K7010 Alcoholic hepatitis without ascites: Secondary | ICD-10-CM | POA: Diagnosis not present

## 2021-06-27 DIAGNOSIS — Z8616 Personal history of COVID-19: Secondary | ICD-10-CM | POA: Diagnosis not present

## 2021-06-27 DIAGNOSIS — K2980 Duodenitis without bleeding: Secondary | ICD-10-CM | POA: Diagnosis not present

## 2021-06-27 DIAGNOSIS — D649 Anemia, unspecified: Secondary | ICD-10-CM | POA: Diagnosis not present

## 2021-06-27 DIAGNOSIS — F10188 Alcohol abuse with other alcohol-induced disorder: Secondary | ICD-10-CM | POA: Diagnosis not present

## 2021-06-27 DIAGNOSIS — K767 Hepatorenal syndrome: Secondary | ICD-10-CM | POA: Diagnosis not present

## 2021-06-28 DIAGNOSIS — K319 Disease of stomach and duodenum, unspecified: Secondary | ICD-10-CM | POA: Diagnosis not present

## 2021-06-28 DIAGNOSIS — D649 Anemia, unspecified: Secondary | ICD-10-CM | POA: Diagnosis not present

## 2021-06-28 DIAGNOSIS — Z8616 Personal history of COVID-19: Secondary | ICD-10-CM | POA: Diagnosis not present

## 2021-06-28 DIAGNOSIS — K2980 Duodenitis without bleeding: Secondary | ICD-10-CM | POA: Diagnosis not present

## 2021-06-28 DIAGNOSIS — R69 Illness, unspecified: Secondary | ICD-10-CM | POA: Diagnosis not present

## 2021-06-28 DIAGNOSIS — F10188 Alcohol abuse with other alcohol-induced disorder: Secondary | ICD-10-CM | POA: Diagnosis not present

## 2021-06-28 DIAGNOSIS — K767 Hepatorenal syndrome: Secondary | ICD-10-CM | POA: Diagnosis not present

## 2021-06-28 DIAGNOSIS — K7010 Alcoholic hepatitis without ascites: Secondary | ICD-10-CM | POA: Diagnosis not present

## 2021-06-28 DIAGNOSIS — D689 Coagulation defect, unspecified: Secondary | ICD-10-CM | POA: Diagnosis not present

## 2021-06-29 DIAGNOSIS — Z8616 Personal history of COVID-19: Secondary | ICD-10-CM | POA: Diagnosis not present

## 2021-06-29 DIAGNOSIS — F10188 Alcohol abuse with other alcohol-induced disorder: Secondary | ICD-10-CM | POA: Diagnosis not present

## 2021-06-29 DIAGNOSIS — K319 Disease of stomach and duodenum, unspecified: Secondary | ICD-10-CM | POA: Diagnosis not present

## 2021-06-29 DIAGNOSIS — R69 Illness, unspecified: Secondary | ICD-10-CM | POA: Diagnosis not present

## 2021-06-29 DIAGNOSIS — K767 Hepatorenal syndrome: Secondary | ICD-10-CM | POA: Diagnosis not present

## 2021-06-29 DIAGNOSIS — D649 Anemia, unspecified: Secondary | ICD-10-CM | POA: Diagnosis not present

## 2021-06-29 DIAGNOSIS — D689 Coagulation defect, unspecified: Secondary | ICD-10-CM | POA: Diagnosis not present

## 2021-06-29 DIAGNOSIS — K2980 Duodenitis without bleeding: Secondary | ICD-10-CM | POA: Diagnosis not present

## 2021-06-29 DIAGNOSIS — K7010 Alcoholic hepatitis without ascites: Secondary | ICD-10-CM | POA: Diagnosis not present

## 2021-06-30 DIAGNOSIS — D649 Anemia, unspecified: Secondary | ICD-10-CM | POA: Diagnosis not present

## 2021-06-30 DIAGNOSIS — K319 Disease of stomach and duodenum, unspecified: Secondary | ICD-10-CM | POA: Diagnosis not present

## 2021-06-30 DIAGNOSIS — F10188 Alcohol abuse with other alcohol-induced disorder: Secondary | ICD-10-CM | POA: Diagnosis not present

## 2021-06-30 DIAGNOSIS — Z8616 Personal history of COVID-19: Secondary | ICD-10-CM | POA: Diagnosis not present

## 2021-06-30 DIAGNOSIS — D689 Coagulation defect, unspecified: Secondary | ICD-10-CM | POA: Diagnosis not present

## 2021-06-30 DIAGNOSIS — K2980 Duodenitis without bleeding: Secondary | ICD-10-CM | POA: Diagnosis not present

## 2021-06-30 DIAGNOSIS — K767 Hepatorenal syndrome: Secondary | ICD-10-CM | POA: Diagnosis not present

## 2021-06-30 DIAGNOSIS — R69 Illness, unspecified: Secondary | ICD-10-CM | POA: Diagnosis not present

## 2021-06-30 DIAGNOSIS — K7010 Alcoholic hepatitis without ascites: Secondary | ICD-10-CM | POA: Diagnosis not present

## 2021-07-01 DIAGNOSIS — Z8616 Personal history of COVID-19: Secondary | ICD-10-CM | POA: Diagnosis not present

## 2021-07-01 DIAGNOSIS — K767 Hepatorenal syndrome: Secondary | ICD-10-CM | POA: Diagnosis not present

## 2021-07-01 DIAGNOSIS — R69 Illness, unspecified: Secondary | ICD-10-CM | POA: Diagnosis not present

## 2021-07-01 DIAGNOSIS — K2980 Duodenitis without bleeding: Secondary | ICD-10-CM | POA: Diagnosis not present

## 2021-07-01 DIAGNOSIS — K319 Disease of stomach and duodenum, unspecified: Secondary | ICD-10-CM | POA: Diagnosis not present

## 2021-07-01 DIAGNOSIS — D689 Coagulation defect, unspecified: Secondary | ICD-10-CM | POA: Diagnosis not present

## 2021-07-01 DIAGNOSIS — D649 Anemia, unspecified: Secondary | ICD-10-CM | POA: Diagnosis not present

## 2021-07-01 DIAGNOSIS — F10188 Alcohol abuse with other alcohol-induced disorder: Secondary | ICD-10-CM | POA: Diagnosis not present

## 2021-07-01 DIAGNOSIS — K7010 Alcoholic hepatitis without ascites: Secondary | ICD-10-CM | POA: Diagnosis not present

## 2021-07-02 DIAGNOSIS — K767 Hepatorenal syndrome: Secondary | ICD-10-CM | POA: Diagnosis not present

## 2021-07-02 DIAGNOSIS — Z8616 Personal history of COVID-19: Secondary | ICD-10-CM | POA: Diagnosis not present

## 2021-07-02 DIAGNOSIS — D649 Anemia, unspecified: Secondary | ICD-10-CM | POA: Diagnosis not present

## 2021-07-02 DIAGNOSIS — K2980 Duodenitis without bleeding: Secondary | ICD-10-CM | POA: Diagnosis not present

## 2021-07-02 DIAGNOSIS — R69 Illness, unspecified: Secondary | ICD-10-CM | POA: Diagnosis not present

## 2021-07-02 DIAGNOSIS — F10188 Alcohol abuse with other alcohol-induced disorder: Secondary | ICD-10-CM | POA: Diagnosis not present

## 2021-07-02 DIAGNOSIS — K319 Disease of stomach and duodenum, unspecified: Secondary | ICD-10-CM | POA: Diagnosis not present

## 2021-07-02 DIAGNOSIS — K7010 Alcoholic hepatitis without ascites: Secondary | ICD-10-CM | POA: Diagnosis not present

## 2021-07-02 DIAGNOSIS — D689 Coagulation defect, unspecified: Secondary | ICD-10-CM | POA: Diagnosis not present

## 2021-07-03 DIAGNOSIS — K7010 Alcoholic hepatitis without ascites: Secondary | ICD-10-CM | POA: Diagnosis not present

## 2021-07-03 DIAGNOSIS — D689 Coagulation defect, unspecified: Secondary | ICD-10-CM | POA: Diagnosis not present

## 2021-07-03 DIAGNOSIS — R69 Illness, unspecified: Secondary | ICD-10-CM | POA: Diagnosis not present

## 2021-07-03 DIAGNOSIS — Z8616 Personal history of COVID-19: Secondary | ICD-10-CM | POA: Diagnosis not present

## 2021-07-03 DIAGNOSIS — F10188 Alcohol abuse with other alcohol-induced disorder: Secondary | ICD-10-CM | POA: Diagnosis not present

## 2021-07-03 DIAGNOSIS — K767 Hepatorenal syndrome: Secondary | ICD-10-CM | POA: Diagnosis not present

## 2021-07-03 DIAGNOSIS — K2980 Duodenitis without bleeding: Secondary | ICD-10-CM | POA: Diagnosis not present

## 2021-07-03 DIAGNOSIS — D649 Anemia, unspecified: Secondary | ICD-10-CM | POA: Diagnosis not present

## 2021-07-03 DIAGNOSIS — K319 Disease of stomach and duodenum, unspecified: Secondary | ICD-10-CM | POA: Diagnosis not present

## 2021-07-04 DIAGNOSIS — D649 Anemia, unspecified: Secondary | ICD-10-CM | POA: Diagnosis not present

## 2021-07-04 DIAGNOSIS — Z8616 Personal history of COVID-19: Secondary | ICD-10-CM | POA: Diagnosis not present

## 2021-07-04 DIAGNOSIS — R69 Illness, unspecified: Secondary | ICD-10-CM | POA: Diagnosis not present

## 2021-07-04 DIAGNOSIS — K7010 Alcoholic hepatitis without ascites: Secondary | ICD-10-CM | POA: Diagnosis not present

## 2021-07-04 DIAGNOSIS — D689 Coagulation defect, unspecified: Secondary | ICD-10-CM | POA: Diagnosis not present

## 2021-07-04 DIAGNOSIS — K2980 Duodenitis without bleeding: Secondary | ICD-10-CM | POA: Diagnosis not present

## 2021-07-04 DIAGNOSIS — F10188 Alcohol abuse with other alcohol-induced disorder: Secondary | ICD-10-CM | POA: Diagnosis not present

## 2021-07-04 DIAGNOSIS — K767 Hepatorenal syndrome: Secondary | ICD-10-CM | POA: Diagnosis not present

## 2021-07-04 DIAGNOSIS — K319 Disease of stomach and duodenum, unspecified: Secondary | ICD-10-CM | POA: Diagnosis not present

## 2021-07-05 DIAGNOSIS — F10188 Alcohol abuse with other alcohol-induced disorder: Secondary | ICD-10-CM | POA: Diagnosis not present

## 2021-07-05 DIAGNOSIS — D689 Coagulation defect, unspecified: Secondary | ICD-10-CM | POA: Diagnosis not present

## 2021-07-05 DIAGNOSIS — R69 Illness, unspecified: Secondary | ICD-10-CM | POA: Diagnosis not present

## 2021-07-05 DIAGNOSIS — D649 Anemia, unspecified: Secondary | ICD-10-CM | POA: Diagnosis not present

## 2021-07-05 DIAGNOSIS — K7010 Alcoholic hepatitis without ascites: Secondary | ICD-10-CM | POA: Diagnosis not present

## 2021-07-05 DIAGNOSIS — K767 Hepatorenal syndrome: Secondary | ICD-10-CM | POA: Diagnosis not present

## 2021-07-05 DIAGNOSIS — K2980 Duodenitis without bleeding: Secondary | ICD-10-CM | POA: Diagnosis not present

## 2021-07-05 DIAGNOSIS — Z8616 Personal history of COVID-19: Secondary | ICD-10-CM | POA: Diagnosis not present

## 2021-07-05 DIAGNOSIS — K319 Disease of stomach and duodenum, unspecified: Secondary | ICD-10-CM | POA: Diagnosis not present

## 2021-07-05 NOTE — Progress Notes (Signed)
Following message sent to and read by pt as below. Still awaiting his response: ? ?Hi Christopher Carrillo, ?  ?Please discuss with your insurance provider to determine if [facility] or [facility] is covered by your plan. We are in the process of initiating your referral but need to know who is covered by your plan.  ?  ?Thank you ? ?Last read by [name] "Christopher Carrillo" at 10:07 PM on 07/04/2021. ?

## 2021-07-06 DIAGNOSIS — Z8616 Personal history of COVID-19: Secondary | ICD-10-CM | POA: Diagnosis not present

## 2021-07-06 DIAGNOSIS — F10188 Alcohol abuse with other alcohol-induced disorder: Secondary | ICD-10-CM | POA: Diagnosis not present

## 2021-07-06 DIAGNOSIS — K767 Hepatorenal syndrome: Secondary | ICD-10-CM | POA: Diagnosis not present

## 2021-07-06 DIAGNOSIS — D649 Anemia, unspecified: Secondary | ICD-10-CM | POA: Diagnosis not present

## 2021-07-06 DIAGNOSIS — D689 Coagulation defect, unspecified: Secondary | ICD-10-CM | POA: Diagnosis not present

## 2021-07-06 DIAGNOSIS — R69 Illness, unspecified: Secondary | ICD-10-CM | POA: Diagnosis not present

## 2021-07-06 DIAGNOSIS — K2980 Duodenitis without bleeding: Secondary | ICD-10-CM | POA: Diagnosis not present

## 2021-07-06 DIAGNOSIS — K7010 Alcoholic hepatitis without ascites: Secondary | ICD-10-CM | POA: Diagnosis not present

## 2021-07-06 DIAGNOSIS — K319 Disease of stomach and duodenum, unspecified: Secondary | ICD-10-CM | POA: Diagnosis not present

## 2021-07-07 DIAGNOSIS — K767 Hepatorenal syndrome: Secondary | ICD-10-CM | POA: Diagnosis not present

## 2021-07-07 DIAGNOSIS — D649 Anemia, unspecified: Secondary | ICD-10-CM | POA: Diagnosis not present

## 2021-07-07 DIAGNOSIS — F10188 Alcohol abuse with other alcohol-induced disorder: Secondary | ICD-10-CM | POA: Diagnosis not present

## 2021-07-07 DIAGNOSIS — D689 Coagulation defect, unspecified: Secondary | ICD-10-CM | POA: Diagnosis not present

## 2021-07-07 DIAGNOSIS — K7010 Alcoholic hepatitis without ascites: Secondary | ICD-10-CM | POA: Diagnosis not present

## 2021-07-07 DIAGNOSIS — K2980 Duodenitis without bleeding: Secondary | ICD-10-CM | POA: Diagnosis not present

## 2021-07-07 DIAGNOSIS — K319 Disease of stomach and duodenum, unspecified: Secondary | ICD-10-CM | POA: Diagnosis not present

## 2021-07-07 DIAGNOSIS — R69 Illness, unspecified: Secondary | ICD-10-CM | POA: Diagnosis not present

## 2021-07-07 DIAGNOSIS — Z8616 Personal history of COVID-19: Secondary | ICD-10-CM | POA: Diagnosis not present

## 2021-07-08 DIAGNOSIS — F10188 Alcohol abuse with other alcohol-induced disorder: Secondary | ICD-10-CM | POA: Diagnosis not present

## 2021-07-08 DIAGNOSIS — E785 Hyperlipidemia, unspecified: Secondary | ICD-10-CM | POA: Diagnosis not present

## 2021-07-08 DIAGNOSIS — K219 Gastro-esophageal reflux disease without esophagitis: Secondary | ICD-10-CM | POA: Diagnosis not present

## 2021-07-08 DIAGNOSIS — R69 Illness, unspecified: Secondary | ICD-10-CM | POA: Diagnosis not present

## 2021-07-08 DIAGNOSIS — D689 Coagulation defect, unspecified: Secondary | ICD-10-CM | POA: Diagnosis not present

## 2021-07-08 DIAGNOSIS — K2980 Duodenitis without bleeding: Secondary | ICD-10-CM | POA: Diagnosis not present

## 2021-07-08 DIAGNOSIS — D649 Anemia, unspecified: Secondary | ICD-10-CM | POA: Diagnosis not present

## 2021-07-08 DIAGNOSIS — K767 Hepatorenal syndrome: Secondary | ICD-10-CM | POA: Diagnosis not present

## 2021-07-08 DIAGNOSIS — Z8616 Personal history of COVID-19: Secondary | ICD-10-CM | POA: Diagnosis not present

## 2021-07-08 DIAGNOSIS — K7010 Alcoholic hepatitis without ascites: Secondary | ICD-10-CM | POA: Diagnosis not present

## 2021-07-08 DIAGNOSIS — K319 Disease of stomach and duodenum, unspecified: Secondary | ICD-10-CM | POA: Diagnosis not present

## 2021-07-09 DIAGNOSIS — K2980 Duodenitis without bleeding: Secondary | ICD-10-CM | POA: Diagnosis not present

## 2021-07-09 DIAGNOSIS — D689 Coagulation defect, unspecified: Secondary | ICD-10-CM | POA: Diagnosis not present

## 2021-07-09 DIAGNOSIS — K7010 Alcoholic hepatitis without ascites: Secondary | ICD-10-CM | POA: Diagnosis not present

## 2021-07-09 DIAGNOSIS — K767 Hepatorenal syndrome: Secondary | ICD-10-CM | POA: Diagnosis not present

## 2021-07-09 DIAGNOSIS — K219 Gastro-esophageal reflux disease without esophagitis: Secondary | ICD-10-CM | POA: Diagnosis not present

## 2021-07-09 DIAGNOSIS — K319 Disease of stomach and duodenum, unspecified: Secondary | ICD-10-CM | POA: Diagnosis not present

## 2021-07-09 DIAGNOSIS — Z8616 Personal history of COVID-19: Secondary | ICD-10-CM | POA: Diagnosis not present

## 2021-07-09 DIAGNOSIS — E785 Hyperlipidemia, unspecified: Secondary | ICD-10-CM | POA: Diagnosis not present

## 2021-07-09 DIAGNOSIS — R69 Illness, unspecified: Secondary | ICD-10-CM | POA: Diagnosis not present

## 2021-07-09 DIAGNOSIS — D649 Anemia, unspecified: Secondary | ICD-10-CM | POA: Diagnosis not present

## 2021-07-09 DIAGNOSIS — F10188 Alcohol abuse with other alcohol-induced disorder: Secondary | ICD-10-CM | POA: Diagnosis not present

## 2021-07-10 DIAGNOSIS — K2980 Duodenitis without bleeding: Secondary | ICD-10-CM | POA: Diagnosis not present

## 2021-07-10 DIAGNOSIS — R69 Illness, unspecified: Secondary | ICD-10-CM | POA: Diagnosis not present

## 2021-07-10 DIAGNOSIS — D649 Anemia, unspecified: Secondary | ICD-10-CM | POA: Diagnosis not present

## 2021-07-10 DIAGNOSIS — K219 Gastro-esophageal reflux disease without esophagitis: Secondary | ICD-10-CM | POA: Diagnosis not present

## 2021-07-10 DIAGNOSIS — K767 Hepatorenal syndrome: Secondary | ICD-10-CM | POA: Diagnosis not present

## 2021-07-10 DIAGNOSIS — K7010 Alcoholic hepatitis without ascites: Secondary | ICD-10-CM | POA: Diagnosis not present

## 2021-07-10 DIAGNOSIS — D689 Coagulation defect, unspecified: Secondary | ICD-10-CM | POA: Diagnosis not present

## 2021-07-10 DIAGNOSIS — F10188 Alcohol abuse with other alcohol-induced disorder: Secondary | ICD-10-CM | POA: Diagnosis not present

## 2021-07-10 DIAGNOSIS — K319 Disease of stomach and duodenum, unspecified: Secondary | ICD-10-CM | POA: Diagnosis not present

## 2021-07-10 DIAGNOSIS — E785 Hyperlipidemia, unspecified: Secondary | ICD-10-CM | POA: Diagnosis not present

## 2021-07-10 DIAGNOSIS — Z8616 Personal history of COVID-19: Secondary | ICD-10-CM | POA: Diagnosis not present

## 2021-07-11 DIAGNOSIS — K219 Gastro-esophageal reflux disease without esophagitis: Secondary | ICD-10-CM | POA: Diagnosis not present

## 2021-07-11 DIAGNOSIS — K319 Disease of stomach and duodenum, unspecified: Secondary | ICD-10-CM | POA: Diagnosis not present

## 2021-07-11 DIAGNOSIS — K767 Hepatorenal syndrome: Secondary | ICD-10-CM | POA: Diagnosis not present

## 2021-07-11 DIAGNOSIS — E785 Hyperlipidemia, unspecified: Secondary | ICD-10-CM | POA: Diagnosis not present

## 2021-07-11 DIAGNOSIS — D689 Coagulation defect, unspecified: Secondary | ICD-10-CM | POA: Diagnosis not present

## 2021-07-11 DIAGNOSIS — K2980 Duodenitis without bleeding: Secondary | ICD-10-CM | POA: Diagnosis not present

## 2021-07-11 DIAGNOSIS — F10188 Alcohol abuse with other alcohol-induced disorder: Secondary | ICD-10-CM | POA: Diagnosis not present

## 2021-07-11 DIAGNOSIS — K7010 Alcoholic hepatitis without ascites: Secondary | ICD-10-CM | POA: Diagnosis not present

## 2021-07-11 DIAGNOSIS — R69 Illness, unspecified: Secondary | ICD-10-CM | POA: Diagnosis not present

## 2021-07-11 DIAGNOSIS — D649 Anemia, unspecified: Secondary | ICD-10-CM | POA: Diagnosis not present

## 2021-07-11 DIAGNOSIS — Z8616 Personal history of COVID-19: Secondary | ICD-10-CM | POA: Diagnosis not present

## 2021-07-12 DIAGNOSIS — D649 Anemia, unspecified: Secondary | ICD-10-CM | POA: Diagnosis not present

## 2021-07-12 DIAGNOSIS — K7010 Alcoholic hepatitis without ascites: Secondary | ICD-10-CM | POA: Diagnosis not present

## 2021-07-12 DIAGNOSIS — K767 Hepatorenal syndrome: Secondary | ICD-10-CM | POA: Diagnosis not present

## 2021-07-12 DIAGNOSIS — D689 Coagulation defect, unspecified: Secondary | ICD-10-CM | POA: Diagnosis not present

## 2021-07-12 DIAGNOSIS — R69 Illness, unspecified: Secondary | ICD-10-CM | POA: Diagnosis not present

## 2021-07-12 DIAGNOSIS — E785 Hyperlipidemia, unspecified: Secondary | ICD-10-CM | POA: Diagnosis not present

## 2021-07-12 DIAGNOSIS — Z8616 Personal history of COVID-19: Secondary | ICD-10-CM | POA: Diagnosis not present

## 2021-07-12 DIAGNOSIS — K319 Disease of stomach and duodenum, unspecified: Secondary | ICD-10-CM | POA: Diagnosis not present

## 2021-07-12 DIAGNOSIS — K219 Gastro-esophageal reflux disease without esophagitis: Secondary | ICD-10-CM | POA: Diagnosis not present

## 2021-07-12 DIAGNOSIS — F10188 Alcohol abuse with other alcohol-induced disorder: Secondary | ICD-10-CM | POA: Diagnosis not present

## 2021-07-12 DIAGNOSIS — K2980 Duodenitis without bleeding: Secondary | ICD-10-CM | POA: Diagnosis not present

## 2021-07-13 DIAGNOSIS — K319 Disease of stomach and duodenum, unspecified: Secondary | ICD-10-CM | POA: Diagnosis not present

## 2021-07-13 DIAGNOSIS — E785 Hyperlipidemia, unspecified: Secondary | ICD-10-CM | POA: Diagnosis not present

## 2021-07-13 DIAGNOSIS — K767 Hepatorenal syndrome: Secondary | ICD-10-CM | POA: Diagnosis not present

## 2021-07-13 DIAGNOSIS — R69 Illness, unspecified: Secondary | ICD-10-CM | POA: Diagnosis not present

## 2021-07-13 DIAGNOSIS — F10188 Alcohol abuse with other alcohol-induced disorder: Secondary | ICD-10-CM | POA: Diagnosis not present

## 2021-07-13 DIAGNOSIS — D689 Coagulation defect, unspecified: Secondary | ICD-10-CM | POA: Diagnosis not present

## 2021-07-13 DIAGNOSIS — K2980 Duodenitis without bleeding: Secondary | ICD-10-CM | POA: Diagnosis not present

## 2021-07-13 DIAGNOSIS — K7010 Alcoholic hepatitis without ascites: Secondary | ICD-10-CM | POA: Diagnosis not present

## 2021-07-13 DIAGNOSIS — K219 Gastro-esophageal reflux disease without esophagitis: Secondary | ICD-10-CM | POA: Diagnosis not present

## 2021-07-13 DIAGNOSIS — Z8616 Personal history of COVID-19: Secondary | ICD-10-CM | POA: Diagnosis not present

## 2021-07-13 DIAGNOSIS — D649 Anemia, unspecified: Secondary | ICD-10-CM | POA: Diagnosis not present

## 2021-07-14 DIAGNOSIS — K7010 Alcoholic hepatitis without ascites: Secondary | ICD-10-CM | POA: Diagnosis not present

## 2021-07-14 DIAGNOSIS — K767 Hepatorenal syndrome: Secondary | ICD-10-CM | POA: Diagnosis not present

## 2021-07-14 DIAGNOSIS — E785 Hyperlipidemia, unspecified: Secondary | ICD-10-CM | POA: Diagnosis not present

## 2021-07-14 DIAGNOSIS — K319 Disease of stomach and duodenum, unspecified: Secondary | ICD-10-CM | POA: Diagnosis not present

## 2021-07-14 DIAGNOSIS — D689 Coagulation defect, unspecified: Secondary | ICD-10-CM | POA: Diagnosis not present

## 2021-07-14 DIAGNOSIS — K219 Gastro-esophageal reflux disease without esophagitis: Secondary | ICD-10-CM | POA: Diagnosis not present

## 2021-07-14 DIAGNOSIS — F10188 Alcohol abuse with other alcohol-induced disorder: Secondary | ICD-10-CM | POA: Diagnosis not present

## 2021-07-14 DIAGNOSIS — R69 Illness, unspecified: Secondary | ICD-10-CM | POA: Diagnosis not present

## 2021-07-14 DIAGNOSIS — D649 Anemia, unspecified: Secondary | ICD-10-CM | POA: Diagnosis not present

## 2021-07-14 DIAGNOSIS — Z8616 Personal history of COVID-19: Secondary | ICD-10-CM | POA: Diagnosis not present

## 2021-07-14 DIAGNOSIS — K2980 Duodenitis without bleeding: Secondary | ICD-10-CM | POA: Diagnosis not present

## 2021-07-15 DIAGNOSIS — K7010 Alcoholic hepatitis without ascites: Secondary | ICD-10-CM | POA: Diagnosis not present

## 2021-07-15 DIAGNOSIS — K767 Hepatorenal syndrome: Secondary | ICD-10-CM | POA: Diagnosis not present

## 2021-07-15 DIAGNOSIS — Z8616 Personal history of COVID-19: Secondary | ICD-10-CM | POA: Diagnosis not present

## 2021-07-15 DIAGNOSIS — K319 Disease of stomach and duodenum, unspecified: Secondary | ICD-10-CM | POA: Diagnosis not present

## 2021-07-15 DIAGNOSIS — D649 Anemia, unspecified: Secondary | ICD-10-CM | POA: Diagnosis not present

## 2021-07-15 DIAGNOSIS — K2980 Duodenitis without bleeding: Secondary | ICD-10-CM | POA: Diagnosis not present

## 2021-07-15 DIAGNOSIS — K219 Gastro-esophageal reflux disease without esophagitis: Secondary | ICD-10-CM | POA: Diagnosis not present

## 2021-07-15 DIAGNOSIS — R69 Illness, unspecified: Secondary | ICD-10-CM | POA: Diagnosis not present

## 2021-07-15 DIAGNOSIS — F10188 Alcohol abuse with other alcohol-induced disorder: Secondary | ICD-10-CM | POA: Diagnosis not present

## 2021-07-15 DIAGNOSIS — E785 Hyperlipidemia, unspecified: Secondary | ICD-10-CM | POA: Diagnosis not present

## 2021-07-15 DIAGNOSIS — D689 Coagulation defect, unspecified: Secondary | ICD-10-CM | POA: Diagnosis not present

## 2021-07-16 DIAGNOSIS — D649 Anemia, unspecified: Secondary | ICD-10-CM | POA: Diagnosis not present

## 2021-07-16 DIAGNOSIS — K219 Gastro-esophageal reflux disease without esophagitis: Secondary | ICD-10-CM | POA: Diagnosis not present

## 2021-07-16 DIAGNOSIS — Z8616 Personal history of COVID-19: Secondary | ICD-10-CM | POA: Diagnosis not present

## 2021-07-16 DIAGNOSIS — K767 Hepatorenal syndrome: Secondary | ICD-10-CM | POA: Diagnosis not present

## 2021-07-16 DIAGNOSIS — R69 Illness, unspecified: Secondary | ICD-10-CM | POA: Diagnosis not present

## 2021-07-16 DIAGNOSIS — E785 Hyperlipidemia, unspecified: Secondary | ICD-10-CM | POA: Diagnosis not present

## 2021-07-16 DIAGNOSIS — K319 Disease of stomach and duodenum, unspecified: Secondary | ICD-10-CM | POA: Diagnosis not present

## 2021-07-16 DIAGNOSIS — D689 Coagulation defect, unspecified: Secondary | ICD-10-CM | POA: Diagnosis not present

## 2021-07-16 DIAGNOSIS — K7010 Alcoholic hepatitis without ascites: Secondary | ICD-10-CM | POA: Diagnosis not present

## 2021-07-16 DIAGNOSIS — F10188 Alcohol abuse with other alcohol-induced disorder: Secondary | ICD-10-CM | POA: Diagnosis not present

## 2021-07-16 DIAGNOSIS — K2980 Duodenitis without bleeding: Secondary | ICD-10-CM | POA: Diagnosis not present

## 2021-07-17 ENCOUNTER — Telehealth: Payer: Self-pay

## 2021-07-17 DIAGNOSIS — D649 Anemia, unspecified: Secondary | ICD-10-CM | POA: Diagnosis not present

## 2021-07-17 DIAGNOSIS — K7010 Alcoholic hepatitis without ascites: Secondary | ICD-10-CM | POA: Diagnosis not present

## 2021-07-17 DIAGNOSIS — F10188 Alcohol abuse with other alcohol-induced disorder: Secondary | ICD-10-CM | POA: Diagnosis not present

## 2021-07-17 DIAGNOSIS — R69 Illness, unspecified: Secondary | ICD-10-CM | POA: Diagnosis not present

## 2021-07-17 DIAGNOSIS — Z8616 Personal history of COVID-19: Secondary | ICD-10-CM | POA: Diagnosis not present

## 2021-07-17 DIAGNOSIS — D689 Coagulation defect, unspecified: Secondary | ICD-10-CM | POA: Diagnosis not present

## 2021-07-17 DIAGNOSIS — K219 Gastro-esophageal reflux disease without esophagitis: Secondary | ICD-10-CM | POA: Diagnosis not present

## 2021-07-17 DIAGNOSIS — K2980 Duodenitis without bleeding: Secondary | ICD-10-CM | POA: Diagnosis not present

## 2021-07-17 DIAGNOSIS — K767 Hepatorenal syndrome: Secondary | ICD-10-CM | POA: Diagnosis not present

## 2021-07-17 DIAGNOSIS — E785 Hyperlipidemia, unspecified: Secondary | ICD-10-CM | POA: Diagnosis not present

## 2021-07-17 DIAGNOSIS — K319 Disease of stomach and duodenum, unspecified: Secondary | ICD-10-CM | POA: Diagnosis not present

## 2021-07-17 NOTE — Telephone Encounter (Signed)
Review of pt chart indicates pt still has not completed labs. Pt was reminded via My Chart and did read his My Chart message as below: ? ?As of our 06/26/21 conversation, you were advised to stop Lasix/Furosemide, Continue Spironolactone 82m daily and for you to return to the lab in 2 weeks for a repeat labs. It was expected that you would follow the directions to stop the medications as advised and to have labs obtained by 07/10/21. The results are needed now and NOT for a May appointment. ?  ?Thank you ? ?Last read by CKarena Addison"Chuck" at  9:19 PM on 07/13/2021. ? ?Called pt to again remind to complete labs as requested. LVM requesting returned call. ?

## 2021-07-18 ENCOUNTER — Encounter: Payer: Self-pay | Admitting: Nurse Practitioner

## 2021-07-18 DIAGNOSIS — D649 Anemia, unspecified: Secondary | ICD-10-CM | POA: Diagnosis not present

## 2021-07-18 DIAGNOSIS — K219 Gastro-esophageal reflux disease without esophagitis: Secondary | ICD-10-CM | POA: Diagnosis not present

## 2021-07-18 DIAGNOSIS — Z8616 Personal history of COVID-19: Secondary | ICD-10-CM | POA: Diagnosis not present

## 2021-07-18 DIAGNOSIS — R69 Illness, unspecified: Secondary | ICD-10-CM | POA: Diagnosis not present

## 2021-07-18 DIAGNOSIS — F10188 Alcohol abuse with other alcohol-induced disorder: Secondary | ICD-10-CM | POA: Diagnosis not present

## 2021-07-18 DIAGNOSIS — K2980 Duodenitis without bleeding: Secondary | ICD-10-CM | POA: Diagnosis not present

## 2021-07-18 DIAGNOSIS — K767 Hepatorenal syndrome: Secondary | ICD-10-CM | POA: Diagnosis not present

## 2021-07-18 DIAGNOSIS — K319 Disease of stomach and duodenum, unspecified: Secondary | ICD-10-CM | POA: Diagnosis not present

## 2021-07-18 DIAGNOSIS — K7010 Alcoholic hepatitis without ascites: Secondary | ICD-10-CM | POA: Diagnosis not present

## 2021-07-18 DIAGNOSIS — D689 Coagulation defect, unspecified: Secondary | ICD-10-CM | POA: Diagnosis not present

## 2021-07-18 DIAGNOSIS — E785 Hyperlipidemia, unspecified: Secondary | ICD-10-CM | POA: Diagnosis not present

## 2021-07-18 NOTE — Telephone Encounter (Signed)
LVM requesting returned call. Given failed attempts via phone and My Chart, letter has been mailed ?

## 2021-07-19 DIAGNOSIS — F10188 Alcohol abuse with other alcohol-induced disorder: Secondary | ICD-10-CM | POA: Diagnosis not present

## 2021-07-19 DIAGNOSIS — K7010 Alcoholic hepatitis without ascites: Secondary | ICD-10-CM | POA: Diagnosis not present

## 2021-07-19 DIAGNOSIS — D649 Anemia, unspecified: Secondary | ICD-10-CM | POA: Diagnosis not present

## 2021-07-19 DIAGNOSIS — K319 Disease of stomach and duodenum, unspecified: Secondary | ICD-10-CM | POA: Diagnosis not present

## 2021-07-19 DIAGNOSIS — K767 Hepatorenal syndrome: Secondary | ICD-10-CM | POA: Diagnosis not present

## 2021-07-19 DIAGNOSIS — K219 Gastro-esophageal reflux disease without esophagitis: Secondary | ICD-10-CM | POA: Diagnosis not present

## 2021-07-19 DIAGNOSIS — D689 Coagulation defect, unspecified: Secondary | ICD-10-CM | POA: Diagnosis not present

## 2021-07-19 DIAGNOSIS — K2980 Duodenitis without bleeding: Secondary | ICD-10-CM | POA: Diagnosis not present

## 2021-07-19 DIAGNOSIS — R69 Illness, unspecified: Secondary | ICD-10-CM | POA: Diagnosis not present

## 2021-07-19 DIAGNOSIS — Z8616 Personal history of COVID-19: Secondary | ICD-10-CM | POA: Diagnosis not present

## 2021-07-19 DIAGNOSIS — E785 Hyperlipidemia, unspecified: Secondary | ICD-10-CM | POA: Diagnosis not present

## 2021-07-20 ENCOUNTER — Encounter: Payer: Self-pay | Admitting: Gastroenterology

## 2021-07-20 DIAGNOSIS — R69 Illness, unspecified: Secondary | ICD-10-CM | POA: Diagnosis not present

## 2021-07-20 DIAGNOSIS — D689 Coagulation defect, unspecified: Secondary | ICD-10-CM | POA: Diagnosis not present

## 2021-07-20 DIAGNOSIS — K319 Disease of stomach and duodenum, unspecified: Secondary | ICD-10-CM | POA: Diagnosis not present

## 2021-07-20 DIAGNOSIS — E785 Hyperlipidemia, unspecified: Secondary | ICD-10-CM | POA: Diagnosis not present

## 2021-07-20 DIAGNOSIS — K7010 Alcoholic hepatitis without ascites: Secondary | ICD-10-CM | POA: Diagnosis not present

## 2021-07-20 DIAGNOSIS — K2980 Duodenitis without bleeding: Secondary | ICD-10-CM | POA: Diagnosis not present

## 2021-07-20 DIAGNOSIS — K219 Gastro-esophageal reflux disease without esophagitis: Secondary | ICD-10-CM | POA: Diagnosis not present

## 2021-07-20 DIAGNOSIS — F10188 Alcohol abuse with other alcohol-induced disorder: Secondary | ICD-10-CM | POA: Diagnosis not present

## 2021-07-20 DIAGNOSIS — D649 Anemia, unspecified: Secondary | ICD-10-CM | POA: Diagnosis not present

## 2021-07-20 DIAGNOSIS — Z8616 Personal history of COVID-19: Secondary | ICD-10-CM | POA: Diagnosis not present

## 2021-07-20 DIAGNOSIS — K767 Hepatorenal syndrome: Secondary | ICD-10-CM | POA: Diagnosis not present

## 2021-07-20 NOTE — Telephone Encounter (Signed)
Patient returned your call.  Wants to set up labs to be done in [place] [employer]. ?

## 2021-07-20 NOTE — Telephone Encounter (Signed)
Lab requisition printed and mailed to pt home address. Letter also included advising pt that it will be his responsibility to ensure we have the results for whatever outside agency he chooses to attend in [place]. ?

## 2021-07-21 ENCOUNTER — Other Ambulatory Visit (INDEPENDENT_AMBULATORY_CARE_PROVIDER_SITE_OTHER): Payer: Self-pay | Admitting: Family Medicine

## 2021-07-21 DIAGNOSIS — R69 Illness, unspecified: Secondary | ICD-10-CM | POA: Diagnosis not present

## 2021-07-21 DIAGNOSIS — D649 Anemia, unspecified: Secondary | ICD-10-CM | POA: Diagnosis not present

## 2021-07-21 DIAGNOSIS — K319 Disease of stomach and duodenum, unspecified: Secondary | ICD-10-CM | POA: Diagnosis not present

## 2021-07-21 DIAGNOSIS — K219 Gastro-esophageal reflux disease without esophagitis: Secondary | ICD-10-CM | POA: Diagnosis not present

## 2021-07-21 DIAGNOSIS — F10188 Alcohol abuse with other alcohol-induced disorder: Secondary | ICD-10-CM | POA: Diagnosis not present

## 2021-07-21 DIAGNOSIS — K2980 Duodenitis without bleeding: Secondary | ICD-10-CM | POA: Diagnosis not present

## 2021-07-21 DIAGNOSIS — D689 Coagulation defect, unspecified: Secondary | ICD-10-CM | POA: Diagnosis not present

## 2021-07-21 DIAGNOSIS — Z8616 Personal history of COVID-19: Secondary | ICD-10-CM | POA: Diagnosis not present

## 2021-07-21 DIAGNOSIS — K767 Hepatorenal syndrome: Secondary | ICD-10-CM | POA: Diagnosis not present

## 2021-07-21 DIAGNOSIS — K7010 Alcoholic hepatitis without ascites: Secondary | ICD-10-CM | POA: Diagnosis not present

## 2021-07-21 DIAGNOSIS — E785 Hyperlipidemia, unspecified: Secondary | ICD-10-CM | POA: Diagnosis not present

## 2021-07-22 DIAGNOSIS — E785 Hyperlipidemia, unspecified: Secondary | ICD-10-CM | POA: Diagnosis not present

## 2021-07-22 DIAGNOSIS — K767 Hepatorenal syndrome: Secondary | ICD-10-CM | POA: Diagnosis not present

## 2021-07-22 DIAGNOSIS — D649 Anemia, unspecified: Secondary | ICD-10-CM | POA: Diagnosis not present

## 2021-07-22 DIAGNOSIS — R69 Illness, unspecified: Secondary | ICD-10-CM | POA: Diagnosis not present

## 2021-07-22 DIAGNOSIS — D689 Coagulation defect, unspecified: Secondary | ICD-10-CM | POA: Diagnosis not present

## 2021-07-22 DIAGNOSIS — K2980 Duodenitis without bleeding: Secondary | ICD-10-CM | POA: Diagnosis not present

## 2021-07-22 DIAGNOSIS — F10188 Alcohol abuse with other alcohol-induced disorder: Secondary | ICD-10-CM | POA: Diagnosis not present

## 2021-07-22 DIAGNOSIS — K7010 Alcoholic hepatitis without ascites: Secondary | ICD-10-CM | POA: Diagnosis not present

## 2021-07-22 DIAGNOSIS — K319 Disease of stomach and duodenum, unspecified: Secondary | ICD-10-CM | POA: Diagnosis not present

## 2021-07-22 DIAGNOSIS — Z8616 Personal history of COVID-19: Secondary | ICD-10-CM | POA: Diagnosis not present

## 2021-07-22 DIAGNOSIS — K219 Gastro-esophageal reflux disease without esophagitis: Secondary | ICD-10-CM | POA: Diagnosis not present

## 2021-07-23 DIAGNOSIS — E785 Hyperlipidemia, unspecified: Secondary | ICD-10-CM | POA: Diagnosis not present

## 2021-07-23 DIAGNOSIS — F10188 Alcohol abuse with other alcohol-induced disorder: Secondary | ICD-10-CM | POA: Diagnosis not present

## 2021-07-23 DIAGNOSIS — R69 Illness, unspecified: Secondary | ICD-10-CM | POA: Diagnosis not present

## 2021-07-23 DIAGNOSIS — D649 Anemia, unspecified: Secondary | ICD-10-CM | POA: Diagnosis not present

## 2021-07-23 DIAGNOSIS — K767 Hepatorenal syndrome: Secondary | ICD-10-CM | POA: Diagnosis not present

## 2021-07-23 DIAGNOSIS — K2980 Duodenitis without bleeding: Secondary | ICD-10-CM | POA: Diagnosis not present

## 2021-07-23 DIAGNOSIS — K7010 Alcoholic hepatitis without ascites: Secondary | ICD-10-CM | POA: Diagnosis not present

## 2021-07-23 DIAGNOSIS — D689 Coagulation defect, unspecified: Secondary | ICD-10-CM | POA: Diagnosis not present

## 2021-07-23 DIAGNOSIS — K319 Disease of stomach and duodenum, unspecified: Secondary | ICD-10-CM | POA: Diagnosis not present

## 2021-07-23 DIAGNOSIS — Z8616 Personal history of COVID-19: Secondary | ICD-10-CM | POA: Diagnosis not present

## 2021-07-23 DIAGNOSIS — K219 Gastro-esophageal reflux disease without esophagitis: Secondary | ICD-10-CM | POA: Diagnosis not present

## 2021-07-24 ENCOUNTER — Other Ambulatory Visit: Payer: No Typology Code available for payment source

## 2021-07-24 DIAGNOSIS — F10188 Alcohol abuse with other alcohol-induced disorder: Secondary | ICD-10-CM | POA: Diagnosis not present

## 2021-07-24 DIAGNOSIS — K219 Gastro-esophageal reflux disease without esophagitis: Secondary | ICD-10-CM | POA: Diagnosis not present

## 2021-07-24 DIAGNOSIS — K2980 Duodenitis without bleeding: Secondary | ICD-10-CM | POA: Diagnosis not present

## 2021-07-24 DIAGNOSIS — N179 Acute kidney failure, unspecified: Secondary | ICD-10-CM

## 2021-07-24 DIAGNOSIS — K319 Disease of stomach and duodenum, unspecified: Secondary | ICD-10-CM | POA: Diagnosis not present

## 2021-07-24 DIAGNOSIS — K7010 Alcoholic hepatitis without ascites: Secondary | ICD-10-CM | POA: Diagnosis not present

## 2021-07-24 DIAGNOSIS — E785 Hyperlipidemia, unspecified: Secondary | ICD-10-CM | POA: Diagnosis not present

## 2021-07-24 DIAGNOSIS — D649 Anemia, unspecified: Secondary | ICD-10-CM | POA: Diagnosis not present

## 2021-07-24 DIAGNOSIS — K767 Hepatorenal syndrome: Secondary | ICD-10-CM | POA: Diagnosis not present

## 2021-07-24 DIAGNOSIS — D689 Coagulation defect, unspecified: Secondary | ICD-10-CM | POA: Diagnosis not present

## 2021-07-24 DIAGNOSIS — R69 Illness, unspecified: Secondary | ICD-10-CM | POA: Diagnosis not present

## 2021-07-24 DIAGNOSIS — Z8616 Personal history of COVID-19: Secondary | ICD-10-CM | POA: Diagnosis not present

## 2021-07-24 NOTE — Telephone Encounter (Signed)
Inbound call from patient wife. Would like the lab order to go to Dr, Dennard Schaumann. If it can be faxed over as patient want it to be done as soon as  ?

## 2021-07-24 NOTE — Telephone Encounter (Signed)
Called Dr. Samella Carrillo office to inquire about their office policy regarding our office faxing a lab requisition for orders placed by Christopher Carrillo. Christopher Carrillo states it their office policy that they will not obtain outside labs from any outside agency or office. Only labs that can be obtained in their office would be those ordered by Christopher Carrillo. Called pt and informed about Dr. Samella Carrillo office policy. Advised he and his wife are welcome to call and speak directly to [facility] should they have any further questions or concerns re: their office policy. Verbalized acceptance and understanding. ?

## 2021-07-25 DIAGNOSIS — K2980 Duodenitis without bleeding: Secondary | ICD-10-CM | POA: Diagnosis not present

## 2021-07-25 DIAGNOSIS — D649 Anemia, unspecified: Secondary | ICD-10-CM | POA: Diagnosis not present

## 2021-07-25 DIAGNOSIS — D689 Coagulation defect, unspecified: Secondary | ICD-10-CM | POA: Diagnosis not present

## 2021-07-25 DIAGNOSIS — K767 Hepatorenal syndrome: Secondary | ICD-10-CM | POA: Diagnosis not present

## 2021-07-25 DIAGNOSIS — F10188 Alcohol abuse with other alcohol-induced disorder: Secondary | ICD-10-CM | POA: Diagnosis not present

## 2021-07-25 DIAGNOSIS — K219 Gastro-esophageal reflux disease without esophagitis: Secondary | ICD-10-CM | POA: Diagnosis not present

## 2021-07-25 DIAGNOSIS — R69 Illness, unspecified: Secondary | ICD-10-CM | POA: Diagnosis not present

## 2021-07-25 DIAGNOSIS — Z8616 Personal history of COVID-19: Secondary | ICD-10-CM | POA: Diagnosis not present

## 2021-07-25 DIAGNOSIS — K7010 Alcoholic hepatitis without ascites: Secondary | ICD-10-CM | POA: Diagnosis not present

## 2021-07-25 DIAGNOSIS — E785 Hyperlipidemia, unspecified: Secondary | ICD-10-CM | POA: Diagnosis not present

## 2021-07-25 DIAGNOSIS — K319 Disease of stomach and duodenum, unspecified: Secondary | ICD-10-CM | POA: Diagnosis not present

## 2021-07-25 LAB — BASIC METABOLIC PANEL
BUN: 9 mg/dL (ref 7–25)
CO2: 23 mmol/L (ref 20–32)
Calcium: 9.2 mg/dL (ref 8.6–10.3)
Chloride: 109 mmol/L (ref 98–110)
Creat: 0.9 mg/dL (ref 0.60–1.29)
Glucose, Bld: 129 mg/dL — ABNORMAL HIGH (ref 65–99)
Potassium: 4.3 mmol/L (ref 3.5–5.3)
Sodium: 141 mmol/L (ref 135–146)

## 2021-07-26 ENCOUNTER — Other Ambulatory Visit: Payer: Self-pay

## 2021-07-26 DIAGNOSIS — K7010 Alcoholic hepatitis without ascites: Secondary | ICD-10-CM | POA: Diagnosis not present

## 2021-07-26 DIAGNOSIS — K219 Gastro-esophageal reflux disease without esophagitis: Secondary | ICD-10-CM | POA: Diagnosis not present

## 2021-07-26 DIAGNOSIS — E785 Hyperlipidemia, unspecified: Secondary | ICD-10-CM | POA: Diagnosis not present

## 2021-07-26 DIAGNOSIS — K767 Hepatorenal syndrome: Secondary | ICD-10-CM | POA: Diagnosis not present

## 2021-07-26 DIAGNOSIS — D649 Anemia, unspecified: Secondary | ICD-10-CM | POA: Diagnosis not present

## 2021-07-26 DIAGNOSIS — Z8616 Personal history of COVID-19: Secondary | ICD-10-CM | POA: Diagnosis not present

## 2021-07-26 DIAGNOSIS — K7030 Alcoholic cirrhosis of liver without ascites: Secondary | ICD-10-CM

## 2021-07-26 DIAGNOSIS — R69 Illness, unspecified: Secondary | ICD-10-CM | POA: Diagnosis not present

## 2021-07-26 DIAGNOSIS — F10188 Alcohol abuse with other alcohol-induced disorder: Secondary | ICD-10-CM | POA: Diagnosis not present

## 2021-07-26 DIAGNOSIS — D689 Coagulation defect, unspecified: Secondary | ICD-10-CM | POA: Diagnosis not present

## 2021-07-26 DIAGNOSIS — K319 Disease of stomach and duodenum, unspecified: Secondary | ICD-10-CM | POA: Diagnosis not present

## 2021-07-26 DIAGNOSIS — K2980 Duodenitis without bleeding: Secondary | ICD-10-CM | POA: Diagnosis not present

## 2021-07-27 ENCOUNTER — Ambulatory Visit (INDEPENDENT_AMBULATORY_CARE_PROVIDER_SITE_OTHER): Payer: No Typology Code available for payment source | Admitting: Family Medicine

## 2021-07-27 VITALS — BP 115/72 | HR 98 | Temp 97.3°F | Ht 68.0 in | Wt 182.4 lb

## 2021-07-27 DIAGNOSIS — K319 Disease of stomach and duodenum, unspecified: Secondary | ICD-10-CM | POA: Diagnosis not present

## 2021-07-27 DIAGNOSIS — Z8616 Personal history of COVID-19: Secondary | ICD-10-CM | POA: Diagnosis not present

## 2021-07-27 DIAGNOSIS — E785 Hyperlipidemia, unspecified: Secondary | ICD-10-CM | POA: Diagnosis not present

## 2021-07-27 DIAGNOSIS — K7030 Alcoholic cirrhosis of liver without ascites: Secondary | ICD-10-CM

## 2021-07-27 DIAGNOSIS — K2980 Duodenitis without bleeding: Secondary | ICD-10-CM | POA: Diagnosis not present

## 2021-07-27 DIAGNOSIS — R69 Illness, unspecified: Secondary | ICD-10-CM | POA: Diagnosis not present

## 2021-07-27 DIAGNOSIS — F10188 Alcohol abuse with other alcohol-induced disorder: Secondary | ICD-10-CM | POA: Diagnosis not present

## 2021-07-27 DIAGNOSIS — K7010 Alcoholic hepatitis without ascites: Secondary | ICD-10-CM | POA: Diagnosis not present

## 2021-07-27 DIAGNOSIS — D689 Coagulation defect, unspecified: Secondary | ICD-10-CM | POA: Diagnosis not present

## 2021-07-27 DIAGNOSIS — D649 Anemia, unspecified: Secondary | ICD-10-CM | POA: Diagnosis not present

## 2021-07-27 DIAGNOSIS — K219 Gastro-esophageal reflux disease without esophagitis: Secondary | ICD-10-CM | POA: Diagnosis not present

## 2021-07-27 DIAGNOSIS — K767 Hepatorenal syndrome: Secondary | ICD-10-CM | POA: Diagnosis not present

## 2021-07-27 NOTE — Progress Notes (Signed)
? ?Subjective:  ? ? Patient ID: Christopher Carrillo, male    DOB: 1970-06-05, 51 y.o.   MRN: [id number] ? ?HPI ?Patient looks remarkable today.  The last time I saw the patient was in February.  At that time, he was on hospice due to liver failure from alcoholic cirrhosis.  However he has been abstinent from alcohol for the last 3 months.  His bilirubin has trended down for more than 30 to just above 2.  His bilirubin at this time is still pending.  His most recent albumin in March was back to 3.9.  His INR has dropped from 1.8-1.1.  He is no longer jaundiced.  There may be slight scleral icterus.  He has stopped spironolactone and he no longer has any swelling in his extremities even despite not taking the fluid pill.  Honestly, the patient seems to be doing well.  I am very happy for him and I am also happy for the fact that he is maintaining his abstinence from alcohol.  He is working on smoking cessation.  He had a BMP drawn earlier this week.  There were no liver function test on that test.  I asked the lab to add that today.  Therefore we do not have his most recent bilirubin or albumin to review but his clinical appearance is much better.  The only concern the patient has is a constant headache.  He reports it is a moderate headache.  Is been present daily for the last 2 months.  There is no photophobia or phonophobia.  There is no obvious neurologic deficit.  He denies any seizures or loss of consciousness.  There is no asterixis on exam.  He is not confused today or lethargic.  He has not had any imaging of the brain as he was acutely ill in January.  However the headache continues to persist with no apparent reason.  He has no previous history of a headache. ? ?Past Medical History:  ?Diagnosis Date  ? Abdominal pain   ? Alcoholic hepatitis without ascites   ? Anemia   ? Anxiety   ? Arthritis   ? Elevated bilirubin   ? Essential hypertension   ? Psoriasis   ? Transaminitis   ? ?Past Surgical History:   ?Procedure Laterality Date  ? BIOPSY  07/20/2019  ? Procedure: BIOPSY;  Surgeon: Daneil Dolin, MD;  [facility];  Service: Endoscopy;;  ? BIOPSY  08/14/2019  ? Procedure: BIOPSY;  Surgeon: Thornton Park, MD;  Location: [place];  Service: Gastroenterology;;  ? COLONOSCOPY    ? COLONOSCOPY WITH PROPOFOL N/A 07/20/2019  ? Procedure: COLONOSCOPY WITH PROPOFOL;  Surgeon: Daneil Dolin, MD;  [facility];  Service: Endoscopy;  Laterality: N/A;  10:15am  ? COLONOSCOPY WITH PROPOFOL N/A 08/14/2019  ? Procedure: COLONOSCOPY WITH PROPOFOL;  Surgeon: Thornton Park, MD;  Location: [place];  Service: Gastroenterology;  Laterality: N/A;  ? IR PARACENTESIS  08/14/2019  ? IR PARACENTESIS  05/02/2021  ? POLYPECTOMY  07/20/2019  ? Procedure: POLYPECTOMY;  Surgeon: Daneil Dolin, MD;  [facility];  Service: Endoscopy;;  ? SUBMUCOSAL TATTOO INJECTION  08/14/2019  ? Procedure: SUBMUCOSAL TATTOO INJECTION;  Surgeon: Thornton Park, MD;  Location: [facility];  Service: Gastroenterology;;  ? ?Current Outpatient Medications on File Prior to Visit  ?Medication Sig Dispense Refill  ? feeding supplement (ENSURE ENLIVE / ENSURE PLUS) LIQD Take 237 mLs by mouth 3 (three) times daily between meals. 237 mL  12  ? folic acid (FOLVITE) 1 MG tablet Take 1 tablet (1 mg total) by mouth daily. 30 tablet 3  ? lactulose (CHRONULAC) 10 GM/15ML solution Take 30 mLs (20 g total) by mouth daily. 473 mL 2  ? ondansetron (ZOFRAN) 4 MG tablet Take 1 tablet (4 mg total) by mouth every 6 (six) hours as needed for nausea. 20 tablet 0  ? pantoprazole (PROTONIX) 40 MG tablet Take 1 tablet (40 mg total) by mouth 2 (two) times daily before a meal. 60 tablet 2  ? potassium chloride SA (KLOR-CON M) 20 MEQ tablet TAKE 1 TABLET BY MOUTH EVERY DAY 30 tablet 0  ? predniSONE (DELTASONE) 5 MG tablet Take 40 mg daily, then decrease by 5 mg weekly. 150 tablet 3  ? rifaximin (XIFAXAN) 550 MG TABS tablet Take 1 tablet  (550 mg total) by mouth 2 (two) times daily. 90 tablet 2  ? spironolactone (ALDACTONE) 25 MG tablet Take 1 tablet (25 mg total) by mouth daily. 30 tablet 5  ? thiamine 100 MG tablet Take 1 tablet (100 mg total) by mouth daily. 30 tablet 2  ? traMADol (ULTRAM) 50 MG tablet TAKE 1 TABLET(50 MG) BY MOUTH EVERY 6 HOURS AS NEEDED (Patient not taking: Reported on 07/27/2021) 30 tablet 0  ? ?No current facility-administered medications on file prior to visit.  ? ?Allergies  ?Allergen Reactions  ? Oxycodone Itching  ? ?Social History  ? ?Socioeconomic History  ? Marital status: Married  ?  Spouse name: Not on file  ? Number of children: Not on file  ? Years of education: Not on file  ? Highest education level: Not on file  ?Occupational History  ? Not on file  ?Tobacco Use  ? Smoking status: Every Day  ?  Packs/day: 0.33  ?  Types: Cigarettes  ? Smokeless tobacco: Never  ?Vaping Use  ? Vaping Use: Never used  ?Substance and Sexual Activity  ? Alcohol use: Yes  ?  Alcohol/week: 1.0 standard drink  ?  Types: 1 Cans of beer per week  ?  Comment: occasionally  ? Drug use: No  ? Sexual activity: Yes  ?Other Topics Concern  ? Not on file  ?Social History Narrative  ? Not on file  ? ?Social Determinants of Health  ? ?Financial Resource Strain: Not on file  ?Food Insecurity: Not on file  ?Transportation Needs: Not on file  ?Physical Activity: Not on file  ?Stress: Not on file  ?Social Connections: Not on file  ?Intimate Partner Violence: Not on file  ? ? ? ?Review of Systems ? ?   ?Objective:  ? Physical Exam ?Vitals reviewed.  ?Constitutional:   ?   General: He is not in acute distress. ?   Appearance: Normal appearance. He is normal weight. He is not ill-appearing, toxic-appearing or diaphoretic.  ?Eyes:  ?   General: Scleral icterus present.  ?Cardiovascular:  ?   Rate and Rhythm: Normal rate and regular rhythm.  ?   Heart sounds: Normal heart sounds. No murmur heard. ?  No friction rub. No gallop.  ?Pulmonary:  ?   Effort:  Pulmonary effort is normal. No respiratory distress.  ?   Breath sounds: Normal breath sounds. No stridor. No wheezing, rhonchi or rales.  ?Abdominal:  ?   General: Abdomen is flat. Bowel sounds are normal. There is no distension.  ?   Palpations: Abdomen is soft.  ?   Tenderness: There is no abdominal tenderness. There is no guarding or  rebound.  ?Musculoskeletal:  ?   Right lower leg: No edema.  ?   Left lower leg: No edema.  ?Skin: ?   Coloration: Skin is not jaundiced or pale.  ?Neurological:  ?   General: No focal deficit present.  ?   Mental Status: He is alert and oriented to person, place, and time. Mental status is at baseline.  ? ? ? ? ? ?   ?Assessment & Plan:  ?Alcoholic cirrhosis of liver without ascites (May) ?I am amazed by how good the patient is doing.  At this point he is almost back to his baseline.  He is even asking me about going back to work.  My only concern about him returning to work at this point is if he were to catch a virus or some type of illness.  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  Other than that, he is physically working around his house without any difficulty.  His blood pressure today is excellent.  His physical exam is normal except for some mild scleral icterus.  Await the results of his CMP.  Given the persistent nature of the headache, we discussed scheduling imaging of the burning.  The present time, the patient elects to monitor the situation clinically however if the headache worsens or becomes more intense he agrees to proceed with a CT scan of the brain. ?

## 2021-07-28 DIAGNOSIS — K2980 Duodenitis without bleeding: Secondary | ICD-10-CM | POA: Diagnosis not present

## 2021-07-28 DIAGNOSIS — K319 Disease of stomach and duodenum, unspecified: Secondary | ICD-10-CM | POA: Diagnosis not present

## 2021-07-28 DIAGNOSIS — K219 Gastro-esophageal reflux disease without esophagitis: Secondary | ICD-10-CM | POA: Diagnosis not present

## 2021-07-28 DIAGNOSIS — E785 Hyperlipidemia, unspecified: Secondary | ICD-10-CM | POA: Diagnosis not present

## 2021-07-28 DIAGNOSIS — F10188 Alcohol abuse with other alcohol-induced disorder: Secondary | ICD-10-CM | POA: Diagnosis not present

## 2021-07-28 DIAGNOSIS — K767 Hepatorenal syndrome: Secondary | ICD-10-CM | POA: Diagnosis not present

## 2021-07-28 DIAGNOSIS — R69 Illness, unspecified: Secondary | ICD-10-CM | POA: Diagnosis not present

## 2021-07-28 DIAGNOSIS — K7010 Alcoholic hepatitis without ascites: Secondary | ICD-10-CM | POA: Diagnosis not present

## 2021-07-28 DIAGNOSIS — Z8616 Personal history of COVID-19: Secondary | ICD-10-CM | POA: Diagnosis not present

## 2021-07-28 DIAGNOSIS — D689 Coagulation defect, unspecified: Secondary | ICD-10-CM | POA: Diagnosis not present

## 2021-07-28 DIAGNOSIS — D649 Anemia, unspecified: Secondary | ICD-10-CM | POA: Diagnosis not present

## 2021-07-29 DIAGNOSIS — R69 Illness, unspecified: Secondary | ICD-10-CM | POA: Diagnosis not present

## 2021-07-29 DIAGNOSIS — Z8616 Personal history of COVID-19: Secondary | ICD-10-CM | POA: Diagnosis not present

## 2021-07-29 DIAGNOSIS — K7010 Alcoholic hepatitis without ascites: Secondary | ICD-10-CM | POA: Diagnosis not present

## 2021-07-29 DIAGNOSIS — D689 Coagulation defect, unspecified: Secondary | ICD-10-CM | POA: Diagnosis not present

## 2021-07-29 DIAGNOSIS — K2980 Duodenitis without bleeding: Secondary | ICD-10-CM | POA: Diagnosis not present

## 2021-07-29 DIAGNOSIS — K319 Disease of stomach and duodenum, unspecified: Secondary | ICD-10-CM | POA: Diagnosis not present

## 2021-07-29 DIAGNOSIS — D649 Anemia, unspecified: Secondary | ICD-10-CM | POA: Diagnosis not present

## 2021-07-29 DIAGNOSIS — E785 Hyperlipidemia, unspecified: Secondary | ICD-10-CM | POA: Diagnosis not present

## 2021-07-29 DIAGNOSIS — K767 Hepatorenal syndrome: Secondary | ICD-10-CM | POA: Diagnosis not present

## 2021-07-29 DIAGNOSIS — F10188 Alcohol abuse with other alcohol-induced disorder: Secondary | ICD-10-CM | POA: Diagnosis not present

## 2021-07-29 DIAGNOSIS — K219 Gastro-esophageal reflux disease without esophagitis: Secondary | ICD-10-CM | POA: Diagnosis not present

## 2021-07-29 LAB — COMPREHENSIVE METABOLIC PANEL
AG Ratio: 1.5 (calc) (ref 1.0–2.5)
ALT: 16 U/L (ref 9–46)
AST: 37 U/L (ref 10–40)
Albumin: 3.7 g/dL (ref 3.6–5.1)
Alkaline phosphatase (APISO): 291 U/L — ABNORMAL HIGH (ref 36–130)
BUN: 9 mg/dL (ref 7–25)
CO2: 23 mmol/L (ref 20–32)
Calcium: 9.2 mg/dL (ref 8.6–10.3)
Chloride: 109 mmol/L (ref 98–110)
Creat: 0.9 mg/dL (ref 0.60–1.29)
Globulin: 2.4 g/dL (calc) (ref 1.9–3.7)
Glucose, Bld: 129 mg/dL — ABNORMAL HIGH (ref 65–99)
Potassium: 4.3 mmol/L (ref 3.5–5.3)
Sodium: 141 mmol/L (ref 135–146)
Total Bilirubin: 1 mg/dL (ref 0.2–1.2)
Total Protein: 6.1 g/dL (ref 6.1–8.1)

## 2021-07-29 LAB — TEST AUTHORIZATION

## 2021-07-30 DIAGNOSIS — D649 Anemia, unspecified: Secondary | ICD-10-CM | POA: Diagnosis not present

## 2021-07-30 DIAGNOSIS — K219 Gastro-esophageal reflux disease without esophagitis: Secondary | ICD-10-CM | POA: Diagnosis not present

## 2021-07-30 DIAGNOSIS — R69 Illness, unspecified: Secondary | ICD-10-CM | POA: Diagnosis not present

## 2021-07-30 DIAGNOSIS — F10188 Alcohol abuse with other alcohol-induced disorder: Secondary | ICD-10-CM | POA: Diagnosis not present

## 2021-07-30 DIAGNOSIS — Z8616 Personal history of COVID-19: Secondary | ICD-10-CM | POA: Diagnosis not present

## 2021-07-30 DIAGNOSIS — K2980 Duodenitis without bleeding: Secondary | ICD-10-CM | POA: Diagnosis not present

## 2021-07-30 DIAGNOSIS — D689 Coagulation defect, unspecified: Secondary | ICD-10-CM | POA: Diagnosis not present

## 2021-07-30 DIAGNOSIS — E785 Hyperlipidemia, unspecified: Secondary | ICD-10-CM | POA: Diagnosis not present

## 2021-07-30 DIAGNOSIS — K7010 Alcoholic hepatitis without ascites: Secondary | ICD-10-CM | POA: Diagnosis not present

## 2021-07-30 DIAGNOSIS — K767 Hepatorenal syndrome: Secondary | ICD-10-CM | POA: Diagnosis not present

## 2021-07-30 DIAGNOSIS — K319 Disease of stomach and duodenum, unspecified: Secondary | ICD-10-CM | POA: Diagnosis not present

## 2021-07-31 DIAGNOSIS — D649 Anemia, unspecified: Secondary | ICD-10-CM | POA: Diagnosis not present

## 2021-07-31 DIAGNOSIS — D689 Coagulation defect, unspecified: Secondary | ICD-10-CM | POA: Diagnosis not present

## 2021-07-31 DIAGNOSIS — Z8616 Personal history of COVID-19: Secondary | ICD-10-CM | POA: Diagnosis not present

## 2021-07-31 DIAGNOSIS — R69 Illness, unspecified: Secondary | ICD-10-CM | POA: Diagnosis not present

## 2021-07-31 DIAGNOSIS — F10188 Alcohol abuse with other alcohol-induced disorder: Secondary | ICD-10-CM | POA: Diagnosis not present

## 2021-07-31 DIAGNOSIS — K219 Gastro-esophageal reflux disease without esophagitis: Secondary | ICD-10-CM | POA: Diagnosis not present

## 2021-07-31 DIAGNOSIS — K319 Disease of stomach and duodenum, unspecified: Secondary | ICD-10-CM | POA: Diagnosis not present

## 2021-07-31 DIAGNOSIS — K767 Hepatorenal syndrome: Secondary | ICD-10-CM | POA: Diagnosis not present

## 2021-07-31 DIAGNOSIS — K7010 Alcoholic hepatitis without ascites: Secondary | ICD-10-CM | POA: Diagnosis not present

## 2021-07-31 DIAGNOSIS — K2980 Duodenitis without bleeding: Secondary | ICD-10-CM | POA: Diagnosis not present

## 2021-07-31 DIAGNOSIS — E785 Hyperlipidemia, unspecified: Secondary | ICD-10-CM | POA: Diagnosis not present

## 2021-08-01 ENCOUNTER — Ambulatory Visit: Payer: No Typology Code available for payment source | Admitting: Gastroenterology

## 2021-08-01 DIAGNOSIS — E785 Hyperlipidemia, unspecified: Secondary | ICD-10-CM | POA: Diagnosis not present

## 2021-08-01 DIAGNOSIS — D649 Anemia, unspecified: Secondary | ICD-10-CM | POA: Diagnosis not present

## 2021-08-01 DIAGNOSIS — F10188 Alcohol abuse with other alcohol-induced disorder: Secondary | ICD-10-CM | POA: Diagnosis not present

## 2021-08-01 DIAGNOSIS — K767 Hepatorenal syndrome: Secondary | ICD-10-CM | POA: Diagnosis not present

## 2021-08-01 DIAGNOSIS — K2980 Duodenitis without bleeding: Secondary | ICD-10-CM | POA: Diagnosis not present

## 2021-08-01 DIAGNOSIS — D689 Coagulation defect, unspecified: Secondary | ICD-10-CM | POA: Diagnosis not present

## 2021-08-01 DIAGNOSIS — K7010 Alcoholic hepatitis without ascites: Secondary | ICD-10-CM | POA: Diagnosis not present

## 2021-08-01 DIAGNOSIS — K319 Disease of stomach and duodenum, unspecified: Secondary | ICD-10-CM | POA: Diagnosis not present

## 2021-08-01 DIAGNOSIS — Z8616 Personal history of COVID-19: Secondary | ICD-10-CM | POA: Diagnosis not present

## 2021-08-01 DIAGNOSIS — R69 Illness, unspecified: Secondary | ICD-10-CM | POA: Diagnosis not present

## 2021-08-01 DIAGNOSIS — K219 Gastro-esophageal reflux disease without esophagitis: Secondary | ICD-10-CM | POA: Diagnosis not present

## 2021-08-02 DIAGNOSIS — K319 Disease of stomach and duodenum, unspecified: Secondary | ICD-10-CM | POA: Diagnosis not present

## 2021-08-02 DIAGNOSIS — D649 Anemia, unspecified: Secondary | ICD-10-CM | POA: Diagnosis not present

## 2021-08-02 DIAGNOSIS — F10188 Alcohol abuse with other alcohol-induced disorder: Secondary | ICD-10-CM | POA: Diagnosis not present

## 2021-08-02 DIAGNOSIS — K767 Hepatorenal syndrome: Secondary | ICD-10-CM | POA: Diagnosis not present

## 2021-08-02 DIAGNOSIS — K2980 Duodenitis without bleeding: Secondary | ICD-10-CM | POA: Diagnosis not present

## 2021-08-02 DIAGNOSIS — R69 Illness, unspecified: Secondary | ICD-10-CM | POA: Diagnosis not present

## 2021-08-02 DIAGNOSIS — K219 Gastro-esophageal reflux disease without esophagitis: Secondary | ICD-10-CM | POA: Diagnosis not present

## 2021-08-02 DIAGNOSIS — K7010 Alcoholic hepatitis without ascites: Secondary | ICD-10-CM | POA: Diagnosis not present

## 2021-08-02 DIAGNOSIS — D689 Coagulation defect, unspecified: Secondary | ICD-10-CM | POA: Diagnosis not present

## 2021-08-02 DIAGNOSIS — E785 Hyperlipidemia, unspecified: Secondary | ICD-10-CM | POA: Diagnosis not present

## 2021-08-02 DIAGNOSIS — Z8616 Personal history of COVID-19: Secondary | ICD-10-CM | POA: Diagnosis not present

## 2021-08-03 DIAGNOSIS — Z8616 Personal history of COVID-19: Secondary | ICD-10-CM | POA: Diagnosis not present

## 2021-08-03 DIAGNOSIS — D649 Anemia, unspecified: Secondary | ICD-10-CM | POA: Diagnosis not present

## 2021-08-03 DIAGNOSIS — K319 Disease of stomach and duodenum, unspecified: Secondary | ICD-10-CM | POA: Diagnosis not present

## 2021-08-03 DIAGNOSIS — D689 Coagulation defect, unspecified: Secondary | ICD-10-CM | POA: Diagnosis not present

## 2021-08-03 DIAGNOSIS — F10188 Alcohol abuse with other alcohol-induced disorder: Secondary | ICD-10-CM | POA: Diagnosis not present

## 2021-08-03 DIAGNOSIS — K767 Hepatorenal syndrome: Secondary | ICD-10-CM | POA: Diagnosis not present

## 2021-08-03 DIAGNOSIS — K219 Gastro-esophageal reflux disease without esophagitis: Secondary | ICD-10-CM | POA: Diagnosis not present

## 2021-08-03 DIAGNOSIS — E785 Hyperlipidemia, unspecified: Secondary | ICD-10-CM | POA: Diagnosis not present

## 2021-08-03 DIAGNOSIS — K7010 Alcoholic hepatitis without ascites: Secondary | ICD-10-CM | POA: Diagnosis not present

## 2021-08-03 DIAGNOSIS — R69 Illness, unspecified: Secondary | ICD-10-CM | POA: Diagnosis not present

## 2021-08-03 DIAGNOSIS — K2980 Duodenitis without bleeding: Secondary | ICD-10-CM | POA: Diagnosis not present

## 2021-08-04 DIAGNOSIS — K319 Disease of stomach and duodenum, unspecified: Secondary | ICD-10-CM | POA: Diagnosis not present

## 2021-08-04 DIAGNOSIS — K767 Hepatorenal syndrome: Secondary | ICD-10-CM | POA: Diagnosis not present

## 2021-08-04 DIAGNOSIS — R69 Illness, unspecified: Secondary | ICD-10-CM | POA: Diagnosis not present

## 2021-08-04 DIAGNOSIS — F10188 Alcohol abuse with other alcohol-induced disorder: Secondary | ICD-10-CM | POA: Diagnosis not present

## 2021-08-04 DIAGNOSIS — D689 Coagulation defect, unspecified: Secondary | ICD-10-CM | POA: Diagnosis not present

## 2021-08-04 DIAGNOSIS — K7010 Alcoholic hepatitis without ascites: Secondary | ICD-10-CM | POA: Diagnosis not present

## 2021-08-04 DIAGNOSIS — D649 Anemia, unspecified: Secondary | ICD-10-CM | POA: Diagnosis not present

## 2021-08-04 DIAGNOSIS — E785 Hyperlipidemia, unspecified: Secondary | ICD-10-CM | POA: Diagnosis not present

## 2021-08-04 DIAGNOSIS — Z8616 Personal history of COVID-19: Secondary | ICD-10-CM | POA: Diagnosis not present

## 2021-08-04 DIAGNOSIS — K219 Gastro-esophageal reflux disease without esophagitis: Secondary | ICD-10-CM | POA: Diagnosis not present

## 2021-08-04 DIAGNOSIS — K2980 Duodenitis without bleeding: Secondary | ICD-10-CM | POA: Diagnosis not present

## 2021-08-05 DIAGNOSIS — K767 Hepatorenal syndrome: Secondary | ICD-10-CM | POA: Diagnosis not present

## 2021-08-05 DIAGNOSIS — E785 Hyperlipidemia, unspecified: Secondary | ICD-10-CM | POA: Diagnosis not present

## 2021-08-05 DIAGNOSIS — F10188 Alcohol abuse with other alcohol-induced disorder: Secondary | ICD-10-CM | POA: Diagnosis not present

## 2021-08-05 DIAGNOSIS — R69 Illness, unspecified: Secondary | ICD-10-CM | POA: Diagnosis not present

## 2021-08-05 DIAGNOSIS — D689 Coagulation defect, unspecified: Secondary | ICD-10-CM | POA: Diagnosis not present

## 2021-08-05 DIAGNOSIS — K219 Gastro-esophageal reflux disease without esophagitis: Secondary | ICD-10-CM | POA: Diagnosis not present

## 2021-08-05 DIAGNOSIS — K2980 Duodenitis without bleeding: Secondary | ICD-10-CM | POA: Diagnosis not present

## 2021-08-05 DIAGNOSIS — Z8616 Personal history of COVID-19: Secondary | ICD-10-CM | POA: Diagnosis not present

## 2021-08-05 DIAGNOSIS — D649 Anemia, unspecified: Secondary | ICD-10-CM | POA: Diagnosis not present

## 2021-08-05 DIAGNOSIS — K7010 Alcoholic hepatitis without ascites: Secondary | ICD-10-CM | POA: Diagnosis not present

## 2021-08-05 DIAGNOSIS — K319 Disease of stomach and duodenum, unspecified: Secondary | ICD-10-CM | POA: Diagnosis not present

## 2021-08-06 DIAGNOSIS — F10188 Alcohol abuse with other alcohol-induced disorder: Secondary | ICD-10-CM | POA: Diagnosis not present

## 2021-08-06 DIAGNOSIS — D689 Coagulation defect, unspecified: Secondary | ICD-10-CM | POA: Diagnosis not present

## 2021-08-06 DIAGNOSIS — Z8616 Personal history of COVID-19: Secondary | ICD-10-CM | POA: Diagnosis not present

## 2021-08-06 DIAGNOSIS — D649 Anemia, unspecified: Secondary | ICD-10-CM | POA: Diagnosis not present

## 2021-08-06 DIAGNOSIS — K7010 Alcoholic hepatitis without ascites: Secondary | ICD-10-CM | POA: Diagnosis not present

## 2021-08-06 DIAGNOSIS — K219 Gastro-esophageal reflux disease without esophagitis: Secondary | ICD-10-CM | POA: Diagnosis not present

## 2021-08-06 DIAGNOSIS — E785 Hyperlipidemia, unspecified: Secondary | ICD-10-CM | POA: Diagnosis not present

## 2021-08-06 DIAGNOSIS — K2980 Duodenitis without bleeding: Secondary | ICD-10-CM | POA: Diagnosis not present

## 2021-08-06 DIAGNOSIS — K767 Hepatorenal syndrome: Secondary | ICD-10-CM | POA: Diagnosis not present

## 2021-08-06 DIAGNOSIS — K319 Disease of stomach and duodenum, unspecified: Secondary | ICD-10-CM | POA: Diagnosis not present

## 2021-08-06 DIAGNOSIS — R69 Illness, unspecified: Secondary | ICD-10-CM | POA: Diagnosis not present

## 2021-08-07 DIAGNOSIS — K2980 Duodenitis without bleeding: Secondary | ICD-10-CM | POA: Diagnosis not present

## 2021-08-07 DIAGNOSIS — E785 Hyperlipidemia, unspecified: Secondary | ICD-10-CM | POA: Diagnosis not present

## 2021-08-07 DIAGNOSIS — D649 Anemia, unspecified: Secondary | ICD-10-CM | POA: Diagnosis not present

## 2021-08-07 DIAGNOSIS — K319 Disease of stomach and duodenum, unspecified: Secondary | ICD-10-CM | POA: Diagnosis not present

## 2021-08-07 DIAGNOSIS — Z8616 Personal history of COVID-19: Secondary | ICD-10-CM | POA: Diagnosis not present

## 2021-08-07 DIAGNOSIS — D689 Coagulation defect, unspecified: Secondary | ICD-10-CM | POA: Diagnosis not present

## 2021-08-07 DIAGNOSIS — K219 Gastro-esophageal reflux disease without esophagitis: Secondary | ICD-10-CM | POA: Diagnosis not present

## 2021-08-07 DIAGNOSIS — K767 Hepatorenal syndrome: Secondary | ICD-10-CM | POA: Diagnosis not present

## 2021-08-07 DIAGNOSIS — F10188 Alcohol abuse with other alcohol-induced disorder: Secondary | ICD-10-CM | POA: Diagnosis not present

## 2021-08-07 DIAGNOSIS — K7010 Alcoholic hepatitis without ascites: Secondary | ICD-10-CM | POA: Diagnosis not present

## 2021-08-07 DIAGNOSIS — R69 Illness, unspecified: Secondary | ICD-10-CM | POA: Diagnosis not present

## 2021-08-08 ENCOUNTER — Ambulatory Visit: Payer: No Typology Code available for payment source | Admitting: Gastroenterology

## 2021-08-08 ENCOUNTER — Encounter: Payer: Self-pay | Admitting: Gastroenterology

## 2021-08-08 VITALS — BP 112/60 | HR 102 | Ht 68.0 in | Wt 180.0 lb

## 2021-08-08 DIAGNOSIS — K767 Hepatorenal syndrome: Secondary | ICD-10-CM | POA: Diagnosis not present

## 2021-08-08 DIAGNOSIS — K7010 Alcoholic hepatitis without ascites: Secondary | ICD-10-CM | POA: Diagnosis not present

## 2021-08-08 DIAGNOSIS — D649 Anemia, unspecified: Secondary | ICD-10-CM | POA: Diagnosis not present

## 2021-08-08 DIAGNOSIS — E785 Hyperlipidemia, unspecified: Secondary | ICD-10-CM | POA: Diagnosis not present

## 2021-08-08 DIAGNOSIS — K319 Disease of stomach and duodenum, unspecified: Secondary | ICD-10-CM | POA: Diagnosis not present

## 2021-08-08 DIAGNOSIS — K2980 Duodenitis without bleeding: Secondary | ICD-10-CM | POA: Diagnosis not present

## 2021-08-08 DIAGNOSIS — R69 Illness, unspecified: Secondary | ICD-10-CM | POA: Diagnosis not present

## 2021-08-08 DIAGNOSIS — K7011 Alcoholic hepatitis with ascites: Secondary | ICD-10-CM | POA: Diagnosis not present

## 2021-08-08 DIAGNOSIS — K7031 Alcoholic cirrhosis of liver with ascites: Secondary | ICD-10-CM | POA: Diagnosis not present

## 2021-08-08 DIAGNOSIS — Z8616 Personal history of COVID-19: Secondary | ICD-10-CM | POA: Diagnosis not present

## 2021-08-08 DIAGNOSIS — F10188 Alcohol abuse with other alcohol-induced disorder: Secondary | ICD-10-CM | POA: Diagnosis not present

## 2021-08-08 DIAGNOSIS — K219 Gastro-esophageal reflux disease without esophagitis: Secondary | ICD-10-CM | POA: Diagnosis not present

## 2021-08-08 DIAGNOSIS — D689 Coagulation defect, unspecified: Secondary | ICD-10-CM | POA: Diagnosis not present

## 2021-08-08 NOTE — Patient Instructions (Signed)
It was my pleasure to provide care to you today. Based on our discussion, I am providing you with my recommendations below: ? ?RECOMMENDATION(S):  ? ?Abdominal Ultrasound due July 2023 ? ?FOLLOW UP: ? ?I would like for you to follow up with me in August 2023. Please call the office at [phone] to schedule your appointment. ? ?BMI: ? ?If you are age 50 or older, your body mass index should be between 23-30. Your Body mass index is 27.37 kg/m?Marland Kitchen If this is out of the aforementioned range listed, please consider follow up with your Primary Care Provider. ? ?If you are age 65 or younger, your body mass index should be between 19-25. Your Body mass index is 27.37 kg/m?Marland Kitchen If this is out of the aformentioned range listed, please consider follow up with your Primary Care Provider.  ? ?MY CHART: ? ?The [facility] providers would like to encourage you to use [facility] to communicate with providers for non-urgent requests or questions.  Due to long hold times on the telephone, sending your provider a message by [facility] may be a faster and more efficient way to get a response.  Please allow 48 business hours for a response.  Please remember that this is for non-urgent requests.  ? ?Thank you for trusting me with your gastrointestinal care!   ? ?Thornton Park, MD, MPH ? ?

## 2021-08-08 NOTE — Progress Notes (Signed)
? ?Referring Provider: Susy Frizzle, MD ?Primary Care Physician:  Susy Frizzle, MD ? ?Chief Complaint:  Cirrhosis ? ? ?IMPRESSION:  ?Alcohol-related cirrhosis complicated by encephalopathy, HRS, panctopenia and ascites - Labs have stabilized since completing 28 days of steroids. Unfortunately after nearly 4 months of sobriety, he started drinking again last week. I've encouraged him to follow-up with [facility]. No esophageal or gastric varices on EGD 03/09/21.  ? ?Ascites - 2000 mg sodium restricted diet. Diuretics tapered as able.  ? ?Encephalopathy - Continue lactulose and Xifaxan.  ? ?GERD - Symptoms stable on PPI BID ? ?History of colon polyps. History of tubular adenomas on colonoscopy 2021. Surveillance recommended 2028. ? ?PLAN: ?- I did not change your medications today ?- Abstinence from all alcohol ?- Formal alcohol counseling recommended ?- Avoid all hepatotoxic drugs including NSAIDs ?- Discussed 2000 mg sodium high protein diet ?- Avoid pain medications, I told him not to use narcotics ?- Abdominal ultrasound 10/2021 for [facility] screening ?- Follow-up with [facility] to readdress liver transplant status ?- I've asked him not to drive given his hepatic encephalopathy ?- Office follow-up in 3 months with Dr. Tarri Glenn or Carl Best, earlier if needed ? ?See patient instructions for additional recommendations.  ? ?HPI: Christopher Carrillo is a 51 y.o. male who returns in follow-up.  His labs have been stabilizing since his last hospitalization 05/01/21-05/05/21. He had been sober since that hospitalization until last week and following with [facility] for alcohol and anxiety. He has not seen them since his hospitalization.  ? ?Returns today with severe abdominal pain that developed last week after binge drinking related to psychosocial stressors. Symptoms resolved without intervention.  ? ?No new complaints or concerns.  ? ?Labs 06/22/21: WBC 11.1, hgb 13.1, platelets 196, INR 1.1, AFP  3 ?Labs 07/24/21: Na 141, crt 0.9, AST 37, ALT 16, alk phos 291 ? ?Last paracentesis 05/02/21 ? ?CT abd/pelvis without contrast 05/04/21 ?IMPRESSION: ?1. Cirrhosis, mesenteric congestive changes and increased mild ?abdominopelvic free ascites. ?2. Mild splenomegaly and upper limit of normal portal vein caliber ?with small upper abdominal varices. ?3. Ascending colitis versus changes of portal hypertension with ?portal colopathy. ?4. Gastroenteritis versus changes of portal hypertension or reactive ?wall thickening from the ascites. ?5. Increased changes of body wall anasarca. ?6. Moderately dense layering material newly noted in the gallbladder ?lumen which could be layering dense sludge or gravel , not seen ?previously. ?7. The gallbladder wall slightly thickened, which was seen ?previously and is probably due to hepatic dysfunction. Correlate ?clinically for early cholecystitis. ?8. Cystitis versus bladder nondistention. ?9. Aortic atherosclerosis. ? ?Past Medical History:  ?Diagnosis Date  ? Abdominal pain   ? Alcoholic hepatitis without ascites   ? Anemia   ? Anxiety   ? Arthritis   ? Elevated bilirubin   ? Essential hypertension   ? Psoriasis   ? Transaminitis   ? ? ?Past Surgical History:  ?Procedure Laterality Date  ? BIOPSY  07/20/2019  ? Procedure: BIOPSY;  Surgeon: Daneil Dolin, MD;  [facility];  Service: Endoscopy;;  ? BIOPSY  08/14/2019  ? Procedure: BIOPSY;  Surgeon: Thornton Park, MD;  Location: [place];  Service: Gastroenterology;;  ? COLONOSCOPY    ? COLONOSCOPY WITH PROPOFOL N/A 07/20/2019  ? Procedure: COLONOSCOPY WITH PROPOFOL;  Surgeon: Daneil Dolin, MD;  [facility];  Service: Endoscopy;  Laterality: N/A;  10:15am  ? COLONOSCOPY WITH PROPOFOL N/A 08/14/2019  ? Procedure: COLONOSCOPY WITH PROPOFOL;  Surgeon: Thornton Park, MD;  Location: [place];  Service: Gastroenterology;  Laterality: N/A;  ? IR PARACENTESIS  08/14/2019  ? IR PARACENTESIS  05/02/2021   ? POLYPECTOMY  07/20/2019  ? Procedure: POLYPECTOMY;  Surgeon: Daneil Dolin, MD;  [facility];  Service: Endoscopy;;  ? SUBMUCOSAL TATTOO INJECTION  08/14/2019  ? Procedure: SUBMUCOSAL TATTOO INJECTION;  Surgeon: Thornton Park, MD;  Location: [place];  Service: Gastroenterology;;  ? ? ?Current Outpatient Medications  ?Medication Sig Dispense Refill  ? feeding supplement (ENSURE ENLIVE / ENSURE PLUS) LIQD Take 237 mLs by mouth 3 (three) times daily between meals. 438 mL 12  ? folic acid (FOLVITE) 1 MG tablet Take 1 tablet (1 mg total) by mouth daily. 30 tablet 3  ? lactulose (CHRONULAC) 10 GM/15ML solution Take 30 mLs (20 g total) by mouth daily. 473 mL 2  ? ondansetron (ZOFRAN) 4 MG tablet Take 1 tablet (4 mg total) by mouth every 6 (six) hours as needed for nausea. 20 tablet 0  ? pantoprazole (PROTONIX) 40 MG tablet Take 1 tablet (40 mg total) by mouth 2 (two) times daily before a meal. 60 tablet 2  ? potassium chloride SA (KLOR-CON M) 20 MEQ tablet TAKE 1 TABLET BY MOUTH EVERY DAY 30 tablet 0  ? predniSONE (DELTASONE) 5 MG tablet Take 40 mg daily, then decrease by 5 mg weekly. 150 tablet 3  ? rifaximin (XIFAXAN) 550 MG TABS tablet Take 1 tablet (550 mg total) by mouth 2 (two) times daily. 90 tablet 2  ? spironolactone (ALDACTONE) 25 MG tablet Take 1 tablet (25 mg total) by mouth daily. 30 tablet 5  ? thiamine 100 MG tablet Take 1 tablet (100 mg total) by mouth daily. 30 tablet 2  ? traMADol (ULTRAM) 50 MG tablet TAKE 1 TABLET(50 MG) BY MOUTH EVERY 6 HOURS AS NEEDED 30 tablet 0  ? ?No current facility-administered medications for this visit.  ? ? ?Allergies as of 08/08/2021 - Review Complete 08/08/2021  ?Allergen Reaction Noted  ? Oxycodone Itching 04/21/2021  ? ? ? ? ? ?Physical Exam: ?Vital signs in last 24 hours: ?General:   Alert, in NAD. Mild scleral icterus. No bilateral temporal wasting.  ?Heart:  Regular rate and rhythm; no murmurs ?Pulm: Clear anteriorly; no wheezing ?Abdomen:   Soft. Nontender. Nondistended. Normal bowel sounds. No rebound or guarding. No fluid wave.  ?LAD: No inguinal or umbilical LAD ?Extremities:  Without edema. ?Neurologic:  Alert and  oriented x4;  grossly normal neurologically; no asterixis or clonus. ?Skin: No jaundice. No palmar erythema or spider angioma.  ?Psych:  Alert and cooperative. Normal mood and affect.  ? ? ?Dolorez Jeffrey L. Tarri Glenn, MD, MPH ?08/08/2021, 3:46 PM ? ? ? ?  ?

## 2021-08-09 DIAGNOSIS — Z8616 Personal history of COVID-19: Secondary | ICD-10-CM | POA: Diagnosis not present

## 2021-08-09 DIAGNOSIS — K767 Hepatorenal syndrome: Secondary | ICD-10-CM | POA: Diagnosis not present

## 2021-08-09 DIAGNOSIS — K219 Gastro-esophageal reflux disease without esophagitis: Secondary | ICD-10-CM | POA: Diagnosis not present

## 2021-08-09 DIAGNOSIS — K2980 Duodenitis without bleeding: Secondary | ICD-10-CM | POA: Diagnosis not present

## 2021-08-09 DIAGNOSIS — D649 Anemia, unspecified: Secondary | ICD-10-CM | POA: Diagnosis not present

## 2021-08-09 DIAGNOSIS — E785 Hyperlipidemia, unspecified: Secondary | ICD-10-CM | POA: Diagnosis not present

## 2021-08-09 DIAGNOSIS — D689 Coagulation defect, unspecified: Secondary | ICD-10-CM | POA: Diagnosis not present

## 2021-08-09 DIAGNOSIS — R69 Illness, unspecified: Secondary | ICD-10-CM | POA: Diagnosis not present

## 2021-08-09 DIAGNOSIS — F10188 Alcohol abuse with other alcohol-induced disorder: Secondary | ICD-10-CM | POA: Diagnosis not present

## 2021-08-09 DIAGNOSIS — K319 Disease of stomach and duodenum, unspecified: Secondary | ICD-10-CM | POA: Diagnosis not present

## 2021-08-09 DIAGNOSIS — K7010 Alcoholic hepatitis without ascites: Secondary | ICD-10-CM | POA: Diagnosis not present

## 2021-08-10 ENCOUNTER — Telehealth: Payer: Self-pay

## 2021-08-10 DIAGNOSIS — R69 Illness, unspecified: Secondary | ICD-10-CM | POA: Diagnosis not present

## 2021-08-10 DIAGNOSIS — K767 Hepatorenal syndrome: Secondary | ICD-10-CM | POA: Diagnosis not present

## 2021-08-10 DIAGNOSIS — E785 Hyperlipidemia, unspecified: Secondary | ICD-10-CM | POA: Diagnosis not present

## 2021-08-10 DIAGNOSIS — K2980 Duodenitis without bleeding: Secondary | ICD-10-CM | POA: Diagnosis not present

## 2021-08-10 DIAGNOSIS — D689 Coagulation defect, unspecified: Secondary | ICD-10-CM | POA: Diagnosis not present

## 2021-08-10 DIAGNOSIS — K319 Disease of stomach and duodenum, unspecified: Secondary | ICD-10-CM | POA: Diagnosis not present

## 2021-08-10 DIAGNOSIS — F10188 Alcohol abuse with other alcohol-induced disorder: Secondary | ICD-10-CM | POA: Diagnosis not present

## 2021-08-10 DIAGNOSIS — K219 Gastro-esophageal reflux disease without esophagitis: Secondary | ICD-10-CM | POA: Diagnosis not present

## 2021-08-10 DIAGNOSIS — K7010 Alcoholic hepatitis without ascites: Secondary | ICD-10-CM | POA: Diagnosis not present

## 2021-08-10 DIAGNOSIS — D649 Anemia, unspecified: Secondary | ICD-10-CM | POA: Diagnosis not present

## 2021-08-10 DIAGNOSIS — Z8616 Personal history of COVID-19: Secondary | ICD-10-CM | POA: Diagnosis not present

## 2021-08-10 NOTE — Telephone Encounter (Signed)
Pt called into office to ask if he could get a list of the dr's that he has been seeing, as well as his diagnosis and health issues. Pt states this info is needed for his disability claim. Please advise. ? ?Cb#: [phone] ?

## 2021-08-11 DIAGNOSIS — Z8616 Personal history of COVID-19: Secondary | ICD-10-CM | POA: Diagnosis not present

## 2021-08-11 DIAGNOSIS — E785 Hyperlipidemia, unspecified: Secondary | ICD-10-CM | POA: Diagnosis not present

## 2021-08-11 DIAGNOSIS — K7010 Alcoholic hepatitis without ascites: Secondary | ICD-10-CM | POA: Diagnosis not present

## 2021-08-11 DIAGNOSIS — D649 Anemia, unspecified: Secondary | ICD-10-CM | POA: Diagnosis not present

## 2021-08-11 DIAGNOSIS — D689 Coagulation defect, unspecified: Secondary | ICD-10-CM | POA: Diagnosis not present

## 2021-08-11 DIAGNOSIS — K319 Disease of stomach and duodenum, unspecified: Secondary | ICD-10-CM | POA: Diagnosis not present

## 2021-08-11 DIAGNOSIS — K767 Hepatorenal syndrome: Secondary | ICD-10-CM | POA: Diagnosis not present

## 2021-08-11 DIAGNOSIS — R69 Illness, unspecified: Secondary | ICD-10-CM | POA: Diagnosis not present

## 2021-08-11 DIAGNOSIS — K2980 Duodenitis without bleeding: Secondary | ICD-10-CM | POA: Diagnosis not present

## 2021-08-11 DIAGNOSIS — K219 Gastro-esophageal reflux disease without esophagitis: Secondary | ICD-10-CM | POA: Diagnosis not present

## 2021-08-11 DIAGNOSIS — F10188 Alcohol abuse with other alcohol-induced disorder: Secondary | ICD-10-CM | POA: Diagnosis not present

## 2021-08-12 DIAGNOSIS — K767 Hepatorenal syndrome: Secondary | ICD-10-CM | POA: Diagnosis not present

## 2021-08-12 DIAGNOSIS — D649 Anemia, unspecified: Secondary | ICD-10-CM | POA: Diagnosis not present

## 2021-08-12 DIAGNOSIS — E785 Hyperlipidemia, unspecified: Secondary | ICD-10-CM | POA: Diagnosis not present

## 2021-08-12 DIAGNOSIS — F10188 Alcohol abuse with other alcohol-induced disorder: Secondary | ICD-10-CM | POA: Diagnosis not present

## 2021-08-12 DIAGNOSIS — K7010 Alcoholic hepatitis without ascites: Secondary | ICD-10-CM | POA: Diagnosis not present

## 2021-08-12 DIAGNOSIS — K319 Disease of stomach and duodenum, unspecified: Secondary | ICD-10-CM | POA: Diagnosis not present

## 2021-08-12 DIAGNOSIS — D689 Coagulation defect, unspecified: Secondary | ICD-10-CM | POA: Diagnosis not present

## 2021-08-12 DIAGNOSIS — K2980 Duodenitis without bleeding: Secondary | ICD-10-CM | POA: Diagnosis not present

## 2021-08-12 DIAGNOSIS — K219 Gastro-esophageal reflux disease without esophagitis: Secondary | ICD-10-CM | POA: Diagnosis not present

## 2021-08-12 DIAGNOSIS — Z8616 Personal history of COVID-19: Secondary | ICD-10-CM | POA: Diagnosis not present

## 2021-08-12 DIAGNOSIS — R69 Illness, unspecified: Secondary | ICD-10-CM | POA: Diagnosis not present

## 2021-08-13 DIAGNOSIS — R69 Illness, unspecified: Secondary | ICD-10-CM | POA: Diagnosis not present

## 2021-08-13 DIAGNOSIS — K767 Hepatorenal syndrome: Secondary | ICD-10-CM | POA: Diagnosis not present

## 2021-08-13 DIAGNOSIS — Z8616 Personal history of COVID-19: Secondary | ICD-10-CM | POA: Diagnosis not present

## 2021-08-13 DIAGNOSIS — K219 Gastro-esophageal reflux disease without esophagitis: Secondary | ICD-10-CM | POA: Diagnosis not present

## 2021-08-13 DIAGNOSIS — D649 Anemia, unspecified: Secondary | ICD-10-CM | POA: Diagnosis not present

## 2021-08-13 DIAGNOSIS — E785 Hyperlipidemia, unspecified: Secondary | ICD-10-CM | POA: Diagnosis not present

## 2021-08-13 DIAGNOSIS — D689 Coagulation defect, unspecified: Secondary | ICD-10-CM | POA: Diagnosis not present

## 2021-08-13 DIAGNOSIS — K319 Disease of stomach and duodenum, unspecified: Secondary | ICD-10-CM | POA: Diagnosis not present

## 2021-08-13 DIAGNOSIS — K2980 Duodenitis without bleeding: Secondary | ICD-10-CM | POA: Diagnosis not present

## 2021-08-13 DIAGNOSIS — K7010 Alcoholic hepatitis without ascites: Secondary | ICD-10-CM | POA: Diagnosis not present

## 2021-08-13 DIAGNOSIS — F10188 Alcohol abuse with other alcohol-induced disorder: Secondary | ICD-10-CM | POA: Diagnosis not present

## 2021-08-14 DIAGNOSIS — E785 Hyperlipidemia, unspecified: Secondary | ICD-10-CM | POA: Diagnosis not present

## 2021-08-14 DIAGNOSIS — D689 Coagulation defect, unspecified: Secondary | ICD-10-CM | POA: Diagnosis not present

## 2021-08-14 DIAGNOSIS — D649 Anemia, unspecified: Secondary | ICD-10-CM | POA: Diagnosis not present

## 2021-08-14 DIAGNOSIS — K7010 Alcoholic hepatitis without ascites: Secondary | ICD-10-CM | POA: Diagnosis not present

## 2021-08-14 DIAGNOSIS — K219 Gastro-esophageal reflux disease without esophagitis: Secondary | ICD-10-CM | POA: Diagnosis not present

## 2021-08-14 DIAGNOSIS — K767 Hepatorenal syndrome: Secondary | ICD-10-CM | POA: Diagnosis not present

## 2021-08-14 DIAGNOSIS — K2980 Duodenitis without bleeding: Secondary | ICD-10-CM | POA: Diagnosis not present

## 2021-08-14 DIAGNOSIS — F10188 Alcohol abuse with other alcohol-induced disorder: Secondary | ICD-10-CM | POA: Diagnosis not present

## 2021-08-14 DIAGNOSIS — K319 Disease of stomach and duodenum, unspecified: Secondary | ICD-10-CM | POA: Diagnosis not present

## 2021-08-14 DIAGNOSIS — Z8616 Personal history of COVID-19: Secondary | ICD-10-CM | POA: Diagnosis not present

## 2021-08-14 DIAGNOSIS — R69 Illness, unspecified: Secondary | ICD-10-CM | POA: Diagnosis not present

## 2021-08-15 ENCOUNTER — Telehealth: Payer: Self-pay

## 2021-08-15 DIAGNOSIS — K219 Gastro-esophageal reflux disease without esophagitis: Secondary | ICD-10-CM | POA: Diagnosis not present

## 2021-08-15 DIAGNOSIS — K319 Disease of stomach and duodenum, unspecified: Secondary | ICD-10-CM | POA: Diagnosis not present

## 2021-08-15 DIAGNOSIS — Z8616 Personal history of COVID-19: Secondary | ICD-10-CM | POA: Diagnosis not present

## 2021-08-15 DIAGNOSIS — D649 Anemia, unspecified: Secondary | ICD-10-CM | POA: Diagnosis not present

## 2021-08-15 DIAGNOSIS — E785 Hyperlipidemia, unspecified: Secondary | ICD-10-CM | POA: Diagnosis not present

## 2021-08-15 DIAGNOSIS — K2980 Duodenitis without bleeding: Secondary | ICD-10-CM | POA: Diagnosis not present

## 2021-08-15 DIAGNOSIS — K7010 Alcoholic hepatitis without ascites: Secondary | ICD-10-CM | POA: Diagnosis not present

## 2021-08-15 DIAGNOSIS — R69 Illness, unspecified: Secondary | ICD-10-CM | POA: Diagnosis not present

## 2021-08-15 DIAGNOSIS — D689 Coagulation defect, unspecified: Secondary | ICD-10-CM | POA: Diagnosis not present

## 2021-08-15 DIAGNOSIS — K767 Hepatorenal syndrome: Secondary | ICD-10-CM | POA: Diagnosis not present

## 2021-08-15 DIAGNOSIS — F10188 Alcohol abuse with other alcohol-induced disorder: Secondary | ICD-10-CM | POA: Diagnosis not present

## 2021-08-15 NOTE — Telephone Encounter (Signed)
Patient called to see if he is due for repeat labs and if he could be cleared to drive as he is no longer taking any pain medications. ? ?Please advise, thanks! ?

## 2021-08-15 NOTE — Telephone Encounter (Signed)
Per Dr. Dennard Schaumann he does not see the need for any additional labs at this time. He also does not recommend the patient driving due to his condition. ? ?Spoke with patient and advised. He will continue to refrain from driving at this time. Advised him he could schedule follow up here to reassess in 4-8 weeks. Nothing further needed at this time.  ? ?

## 2021-08-16 ENCOUNTER — Telehealth: Payer: Self-pay

## 2021-08-16 DIAGNOSIS — D689 Coagulation defect, unspecified: Secondary | ICD-10-CM | POA: Diagnosis not present

## 2021-08-16 DIAGNOSIS — K7010 Alcoholic hepatitis without ascites: Secondary | ICD-10-CM | POA: Diagnosis not present

## 2021-08-16 DIAGNOSIS — E785 Hyperlipidemia, unspecified: Secondary | ICD-10-CM | POA: Diagnosis not present

## 2021-08-16 DIAGNOSIS — D649 Anemia, unspecified: Secondary | ICD-10-CM | POA: Diagnosis not present

## 2021-08-16 DIAGNOSIS — K219 Gastro-esophageal reflux disease without esophagitis: Secondary | ICD-10-CM | POA: Diagnosis not present

## 2021-08-16 DIAGNOSIS — K767 Hepatorenal syndrome: Secondary | ICD-10-CM | POA: Diagnosis not present

## 2021-08-16 DIAGNOSIS — Z8616 Personal history of COVID-19: Secondary | ICD-10-CM | POA: Diagnosis not present

## 2021-08-16 DIAGNOSIS — K319 Disease of stomach and duodenum, unspecified: Secondary | ICD-10-CM | POA: Diagnosis not present

## 2021-08-16 DIAGNOSIS — R69 Illness, unspecified: Secondary | ICD-10-CM | POA: Diagnosis not present

## 2021-08-16 DIAGNOSIS — K2980 Duodenitis without bleeding: Secondary | ICD-10-CM | POA: Diagnosis not present

## 2021-08-16 DIAGNOSIS — F10188 Alcohol abuse with other alcohol-induced disorder: Secondary | ICD-10-CM | POA: Diagnosis not present

## 2021-08-16 NOTE — Telephone Encounter (Signed)
Pt called today stated that he is applying for disability insurance. Per pt Disability Office is asking pt to get a letter from Dr regarding his Dx as to why he needs the insurance.  ? ?Pt would like for the letter to reflect that due to his illness is considered Terminal and he is on hospice care as well.  ? ? ?

## 2021-08-17 DIAGNOSIS — F10188 Alcohol abuse with other alcohol-induced disorder: Secondary | ICD-10-CM | POA: Diagnosis not present

## 2021-08-17 DIAGNOSIS — D689 Coagulation defect, unspecified: Secondary | ICD-10-CM | POA: Diagnosis not present

## 2021-08-17 DIAGNOSIS — K7010 Alcoholic hepatitis without ascites: Secondary | ICD-10-CM | POA: Diagnosis not present

## 2021-08-17 DIAGNOSIS — K219 Gastro-esophageal reflux disease without esophagitis: Secondary | ICD-10-CM | POA: Diagnosis not present

## 2021-08-17 DIAGNOSIS — D649 Anemia, unspecified: Secondary | ICD-10-CM | POA: Diagnosis not present

## 2021-08-17 DIAGNOSIS — R69 Illness, unspecified: Secondary | ICD-10-CM | POA: Diagnosis not present

## 2021-08-17 DIAGNOSIS — K767 Hepatorenal syndrome: Secondary | ICD-10-CM | POA: Diagnosis not present

## 2021-08-17 DIAGNOSIS — K319 Disease of stomach and duodenum, unspecified: Secondary | ICD-10-CM | POA: Diagnosis not present

## 2021-08-17 DIAGNOSIS — K2980 Duodenitis without bleeding: Secondary | ICD-10-CM | POA: Diagnosis not present

## 2021-08-17 DIAGNOSIS — Z8616 Personal history of COVID-19: Secondary | ICD-10-CM | POA: Diagnosis not present

## 2021-08-17 DIAGNOSIS — E785 Hyperlipidemia, unspecified: Secondary | ICD-10-CM | POA: Diagnosis not present

## 2021-08-18 ENCOUNTER — Encounter: Payer: Self-pay | Admitting: Family Medicine

## 2021-08-18 DIAGNOSIS — K2980 Duodenitis without bleeding: Secondary | ICD-10-CM | POA: Diagnosis not present

## 2021-08-18 DIAGNOSIS — K319 Disease of stomach and duodenum, unspecified: Secondary | ICD-10-CM | POA: Diagnosis not present

## 2021-08-18 DIAGNOSIS — F10188 Alcohol abuse with other alcohol-induced disorder: Secondary | ICD-10-CM | POA: Diagnosis not present

## 2021-08-18 DIAGNOSIS — K219 Gastro-esophageal reflux disease without esophagitis: Secondary | ICD-10-CM | POA: Diagnosis not present

## 2021-08-18 DIAGNOSIS — R69 Illness, unspecified: Secondary | ICD-10-CM | POA: Diagnosis not present

## 2021-08-18 DIAGNOSIS — Z8616 Personal history of COVID-19: Secondary | ICD-10-CM | POA: Diagnosis not present

## 2021-08-18 DIAGNOSIS — K767 Hepatorenal syndrome: Secondary | ICD-10-CM | POA: Diagnosis not present

## 2021-08-18 DIAGNOSIS — E785 Hyperlipidemia, unspecified: Secondary | ICD-10-CM | POA: Diagnosis not present

## 2021-08-18 DIAGNOSIS — D689 Coagulation defect, unspecified: Secondary | ICD-10-CM | POA: Diagnosis not present

## 2021-08-18 DIAGNOSIS — K7010 Alcoholic hepatitis without ascites: Secondary | ICD-10-CM | POA: Diagnosis not present

## 2021-08-18 DIAGNOSIS — D649 Anemia, unspecified: Secondary | ICD-10-CM | POA: Diagnosis not present

## 2021-08-19 DIAGNOSIS — D689 Coagulation defect, unspecified: Secondary | ICD-10-CM | POA: Diagnosis not present

## 2021-08-19 DIAGNOSIS — K219 Gastro-esophageal reflux disease without esophagitis: Secondary | ICD-10-CM | POA: Diagnosis not present

## 2021-08-19 DIAGNOSIS — R69 Illness, unspecified: Secondary | ICD-10-CM | POA: Diagnosis not present

## 2021-08-19 DIAGNOSIS — K767 Hepatorenal syndrome: Secondary | ICD-10-CM | POA: Diagnosis not present

## 2021-08-19 DIAGNOSIS — Z8616 Personal history of COVID-19: Secondary | ICD-10-CM | POA: Diagnosis not present

## 2021-08-19 DIAGNOSIS — E785 Hyperlipidemia, unspecified: Secondary | ICD-10-CM | POA: Diagnosis not present

## 2021-08-19 DIAGNOSIS — K2980 Duodenitis without bleeding: Secondary | ICD-10-CM | POA: Diagnosis not present

## 2021-08-19 DIAGNOSIS — K319 Disease of stomach and duodenum, unspecified: Secondary | ICD-10-CM | POA: Diagnosis not present

## 2021-08-19 DIAGNOSIS — K7010 Alcoholic hepatitis without ascites: Secondary | ICD-10-CM | POA: Diagnosis not present

## 2021-08-19 DIAGNOSIS — D649 Anemia, unspecified: Secondary | ICD-10-CM | POA: Diagnosis not present

## 2021-08-19 DIAGNOSIS — F10188 Alcohol abuse with other alcohol-induced disorder: Secondary | ICD-10-CM | POA: Diagnosis not present

## 2021-08-20 DIAGNOSIS — D649 Anemia, unspecified: Secondary | ICD-10-CM | POA: Diagnosis not present

## 2021-08-20 DIAGNOSIS — K219 Gastro-esophageal reflux disease without esophagitis: Secondary | ICD-10-CM | POA: Diagnosis not present

## 2021-08-20 DIAGNOSIS — D689 Coagulation defect, unspecified: Secondary | ICD-10-CM | POA: Diagnosis not present

## 2021-08-20 DIAGNOSIS — K2980 Duodenitis without bleeding: Secondary | ICD-10-CM | POA: Diagnosis not present

## 2021-08-20 DIAGNOSIS — K319 Disease of stomach and duodenum, unspecified: Secondary | ICD-10-CM | POA: Diagnosis not present

## 2021-08-20 DIAGNOSIS — K767 Hepatorenal syndrome: Secondary | ICD-10-CM | POA: Diagnosis not present

## 2021-08-20 DIAGNOSIS — R69 Illness, unspecified: Secondary | ICD-10-CM | POA: Diagnosis not present

## 2021-08-20 DIAGNOSIS — E785 Hyperlipidemia, unspecified: Secondary | ICD-10-CM | POA: Diagnosis not present

## 2021-08-20 DIAGNOSIS — Z8616 Personal history of COVID-19: Secondary | ICD-10-CM | POA: Diagnosis not present

## 2021-08-20 DIAGNOSIS — F10188 Alcohol abuse with other alcohol-induced disorder: Secondary | ICD-10-CM | POA: Diagnosis not present

## 2021-08-20 DIAGNOSIS — K7010 Alcoholic hepatitis without ascites: Secondary | ICD-10-CM | POA: Diagnosis not present

## 2021-08-21 DIAGNOSIS — K319 Disease of stomach and duodenum, unspecified: Secondary | ICD-10-CM | POA: Diagnosis not present

## 2021-08-21 DIAGNOSIS — K767 Hepatorenal syndrome: Secondary | ICD-10-CM | POA: Diagnosis not present

## 2021-08-21 DIAGNOSIS — Z8616 Personal history of COVID-19: Secondary | ICD-10-CM | POA: Diagnosis not present

## 2021-08-21 DIAGNOSIS — D689 Coagulation defect, unspecified: Secondary | ICD-10-CM | POA: Diagnosis not present

## 2021-08-21 DIAGNOSIS — K219 Gastro-esophageal reflux disease without esophagitis: Secondary | ICD-10-CM | POA: Diagnosis not present

## 2021-08-21 DIAGNOSIS — D649 Anemia, unspecified: Secondary | ICD-10-CM | POA: Diagnosis not present

## 2021-08-21 DIAGNOSIS — E785 Hyperlipidemia, unspecified: Secondary | ICD-10-CM | POA: Diagnosis not present

## 2021-08-21 DIAGNOSIS — K2980 Duodenitis without bleeding: Secondary | ICD-10-CM | POA: Diagnosis not present

## 2021-08-21 DIAGNOSIS — F10188 Alcohol abuse with other alcohol-induced disorder: Secondary | ICD-10-CM | POA: Diagnosis not present

## 2021-08-21 DIAGNOSIS — R69 Illness, unspecified: Secondary | ICD-10-CM | POA: Diagnosis not present

## 2021-08-21 DIAGNOSIS — K7010 Alcoholic hepatitis without ascites: Secondary | ICD-10-CM | POA: Diagnosis not present

## 2021-08-21 NOTE — Telephone Encounter (Signed)
Letter printed w/Dr. Serena Croissant and gave it to front desk for pt to pick up. Left msg for pt to pick up letter via [place].  ?

## 2021-08-22 DIAGNOSIS — K319 Disease of stomach and duodenum, unspecified: Secondary | ICD-10-CM | POA: Diagnosis not present

## 2021-08-22 DIAGNOSIS — D689 Coagulation defect, unspecified: Secondary | ICD-10-CM | POA: Diagnosis not present

## 2021-08-22 DIAGNOSIS — R69 Illness, unspecified: Secondary | ICD-10-CM | POA: Diagnosis not present

## 2021-08-22 DIAGNOSIS — F10188 Alcohol abuse with other alcohol-induced disorder: Secondary | ICD-10-CM | POA: Diagnosis not present

## 2021-08-22 DIAGNOSIS — E785 Hyperlipidemia, unspecified: Secondary | ICD-10-CM | POA: Diagnosis not present

## 2021-08-22 DIAGNOSIS — K7010 Alcoholic hepatitis without ascites: Secondary | ICD-10-CM | POA: Diagnosis not present

## 2021-08-22 DIAGNOSIS — K219 Gastro-esophageal reflux disease without esophagitis: Secondary | ICD-10-CM | POA: Diagnosis not present

## 2021-08-22 DIAGNOSIS — K767 Hepatorenal syndrome: Secondary | ICD-10-CM | POA: Diagnosis not present

## 2021-08-22 DIAGNOSIS — K2980 Duodenitis without bleeding: Secondary | ICD-10-CM | POA: Diagnosis not present

## 2021-08-22 DIAGNOSIS — D649 Anemia, unspecified: Secondary | ICD-10-CM | POA: Diagnosis not present

## 2021-08-22 DIAGNOSIS — Z8616 Personal history of COVID-19: Secondary | ICD-10-CM | POA: Diagnosis not present

## 2021-08-23 DIAGNOSIS — K319 Disease of stomach and duodenum, unspecified: Secondary | ICD-10-CM | POA: Diagnosis not present

## 2021-08-23 DIAGNOSIS — F10188 Alcohol abuse with other alcohol-induced disorder: Secondary | ICD-10-CM | POA: Diagnosis not present

## 2021-08-23 DIAGNOSIS — D649 Anemia, unspecified: Secondary | ICD-10-CM | POA: Diagnosis not present

## 2021-08-23 DIAGNOSIS — K767 Hepatorenal syndrome: Secondary | ICD-10-CM | POA: Diagnosis not present

## 2021-08-23 DIAGNOSIS — D689 Coagulation defect, unspecified: Secondary | ICD-10-CM | POA: Diagnosis not present

## 2021-08-23 DIAGNOSIS — Z8616 Personal history of COVID-19: Secondary | ICD-10-CM | POA: Diagnosis not present

## 2021-08-23 DIAGNOSIS — R69 Illness, unspecified: Secondary | ICD-10-CM | POA: Diagnosis not present

## 2021-08-23 DIAGNOSIS — K2980 Duodenitis without bleeding: Secondary | ICD-10-CM | POA: Diagnosis not present

## 2021-08-23 DIAGNOSIS — K7010 Alcoholic hepatitis without ascites: Secondary | ICD-10-CM | POA: Diagnosis not present

## 2021-08-23 DIAGNOSIS — K219 Gastro-esophageal reflux disease without esophagitis: Secondary | ICD-10-CM | POA: Diagnosis not present

## 2021-08-23 DIAGNOSIS — E785 Hyperlipidemia, unspecified: Secondary | ICD-10-CM | POA: Diagnosis not present

## 2021-08-24 DIAGNOSIS — D649 Anemia, unspecified: Secondary | ICD-10-CM | POA: Diagnosis not present

## 2021-08-24 DIAGNOSIS — K319 Disease of stomach and duodenum, unspecified: Secondary | ICD-10-CM | POA: Diagnosis not present

## 2021-08-24 DIAGNOSIS — R69 Illness, unspecified: Secondary | ICD-10-CM | POA: Diagnosis not present

## 2021-08-24 DIAGNOSIS — E785 Hyperlipidemia, unspecified: Secondary | ICD-10-CM | POA: Diagnosis not present

## 2021-08-24 DIAGNOSIS — K767 Hepatorenal syndrome: Secondary | ICD-10-CM | POA: Diagnosis not present

## 2021-08-24 DIAGNOSIS — Z8616 Personal history of COVID-19: Secondary | ICD-10-CM | POA: Diagnosis not present

## 2021-08-24 DIAGNOSIS — F10188 Alcohol abuse with other alcohol-induced disorder: Secondary | ICD-10-CM | POA: Diagnosis not present

## 2021-08-24 DIAGNOSIS — K2980 Duodenitis without bleeding: Secondary | ICD-10-CM | POA: Diagnosis not present

## 2021-08-24 DIAGNOSIS — D689 Coagulation defect, unspecified: Secondary | ICD-10-CM | POA: Diagnosis not present

## 2021-08-24 DIAGNOSIS — K219 Gastro-esophageal reflux disease without esophagitis: Secondary | ICD-10-CM | POA: Diagnosis not present

## 2021-08-24 DIAGNOSIS — K7010 Alcoholic hepatitis without ascites: Secondary | ICD-10-CM | POA: Diagnosis not present

## 2021-08-25 DIAGNOSIS — Z8616 Personal history of COVID-19: Secondary | ICD-10-CM | POA: Diagnosis not present

## 2021-08-25 DIAGNOSIS — K7010 Alcoholic hepatitis without ascites: Secondary | ICD-10-CM | POA: Diagnosis not present

## 2021-08-25 DIAGNOSIS — K219 Gastro-esophageal reflux disease without esophagitis: Secondary | ICD-10-CM | POA: Diagnosis not present

## 2021-08-25 DIAGNOSIS — E785 Hyperlipidemia, unspecified: Secondary | ICD-10-CM | POA: Diagnosis not present

## 2021-08-25 DIAGNOSIS — K767 Hepatorenal syndrome: Secondary | ICD-10-CM | POA: Diagnosis not present

## 2021-08-25 DIAGNOSIS — F10188 Alcohol abuse with other alcohol-induced disorder: Secondary | ICD-10-CM | POA: Diagnosis not present

## 2021-08-25 DIAGNOSIS — D689 Coagulation defect, unspecified: Secondary | ICD-10-CM | POA: Diagnosis not present

## 2021-08-25 DIAGNOSIS — R69 Illness, unspecified: Secondary | ICD-10-CM | POA: Diagnosis not present

## 2021-08-25 DIAGNOSIS — D649 Anemia, unspecified: Secondary | ICD-10-CM | POA: Diagnosis not present

## 2021-08-25 DIAGNOSIS — K319 Disease of stomach and duodenum, unspecified: Secondary | ICD-10-CM | POA: Diagnosis not present

## 2021-08-25 DIAGNOSIS — K2980 Duodenitis without bleeding: Secondary | ICD-10-CM | POA: Diagnosis not present

## 2021-08-26 DIAGNOSIS — Z8616 Personal history of COVID-19: Secondary | ICD-10-CM | POA: Diagnosis not present

## 2021-08-26 DIAGNOSIS — K319 Disease of stomach and duodenum, unspecified: Secondary | ICD-10-CM | POA: Diagnosis not present

## 2021-08-26 DIAGNOSIS — K767 Hepatorenal syndrome: Secondary | ICD-10-CM | POA: Diagnosis not present

## 2021-08-26 DIAGNOSIS — K7010 Alcoholic hepatitis without ascites: Secondary | ICD-10-CM | POA: Diagnosis not present

## 2021-08-26 DIAGNOSIS — K2980 Duodenitis without bleeding: Secondary | ICD-10-CM | POA: Diagnosis not present

## 2021-08-26 DIAGNOSIS — E785 Hyperlipidemia, unspecified: Secondary | ICD-10-CM | POA: Diagnosis not present

## 2021-08-26 DIAGNOSIS — F10188 Alcohol abuse with other alcohol-induced disorder: Secondary | ICD-10-CM | POA: Diagnosis not present

## 2021-08-26 DIAGNOSIS — K219 Gastro-esophageal reflux disease without esophagitis: Secondary | ICD-10-CM | POA: Diagnosis not present

## 2021-08-26 DIAGNOSIS — D689 Coagulation defect, unspecified: Secondary | ICD-10-CM | POA: Diagnosis not present

## 2021-08-26 DIAGNOSIS — R69 Illness, unspecified: Secondary | ICD-10-CM | POA: Diagnosis not present

## 2021-08-26 DIAGNOSIS — D649 Anemia, unspecified: Secondary | ICD-10-CM | POA: Diagnosis not present

## 2021-08-27 DIAGNOSIS — K319 Disease of stomach and duodenum, unspecified: Secondary | ICD-10-CM | POA: Diagnosis not present

## 2021-08-27 DIAGNOSIS — R69 Illness, unspecified: Secondary | ICD-10-CM | POA: Diagnosis not present

## 2021-08-27 DIAGNOSIS — K2980 Duodenitis without bleeding: Secondary | ICD-10-CM | POA: Diagnosis not present

## 2021-08-27 DIAGNOSIS — E785 Hyperlipidemia, unspecified: Secondary | ICD-10-CM | POA: Diagnosis not present

## 2021-08-27 DIAGNOSIS — D689 Coagulation defect, unspecified: Secondary | ICD-10-CM | POA: Diagnosis not present

## 2021-08-27 DIAGNOSIS — K7010 Alcoholic hepatitis without ascites: Secondary | ICD-10-CM | POA: Diagnosis not present

## 2021-08-27 DIAGNOSIS — D649 Anemia, unspecified: Secondary | ICD-10-CM | POA: Diagnosis not present

## 2021-08-27 DIAGNOSIS — K219 Gastro-esophageal reflux disease without esophagitis: Secondary | ICD-10-CM | POA: Diagnosis not present

## 2021-08-27 DIAGNOSIS — Z8616 Personal history of COVID-19: Secondary | ICD-10-CM | POA: Diagnosis not present

## 2021-08-27 DIAGNOSIS — K767 Hepatorenal syndrome: Secondary | ICD-10-CM | POA: Diagnosis not present

## 2021-08-27 DIAGNOSIS — F10188 Alcohol abuse with other alcohol-induced disorder: Secondary | ICD-10-CM | POA: Diagnosis not present

## 2021-08-28 DIAGNOSIS — D689 Coagulation defect, unspecified: Secondary | ICD-10-CM | POA: Diagnosis not present

## 2021-08-28 DIAGNOSIS — R69 Illness, unspecified: Secondary | ICD-10-CM | POA: Diagnosis not present

## 2021-08-28 DIAGNOSIS — K2980 Duodenitis without bleeding: Secondary | ICD-10-CM | POA: Diagnosis not present

## 2021-08-28 DIAGNOSIS — E785 Hyperlipidemia, unspecified: Secondary | ICD-10-CM | POA: Diagnosis not present

## 2021-08-28 DIAGNOSIS — D649 Anemia, unspecified: Secondary | ICD-10-CM | POA: Diagnosis not present

## 2021-08-28 DIAGNOSIS — K219 Gastro-esophageal reflux disease without esophagitis: Secondary | ICD-10-CM | POA: Diagnosis not present

## 2021-08-28 DIAGNOSIS — K319 Disease of stomach and duodenum, unspecified: Secondary | ICD-10-CM | POA: Diagnosis not present

## 2021-08-28 DIAGNOSIS — K767 Hepatorenal syndrome: Secondary | ICD-10-CM | POA: Diagnosis not present

## 2021-08-28 DIAGNOSIS — Z8616 Personal history of COVID-19: Secondary | ICD-10-CM | POA: Diagnosis not present

## 2021-08-28 DIAGNOSIS — K7010 Alcoholic hepatitis without ascites: Secondary | ICD-10-CM | POA: Diagnosis not present

## 2021-08-28 DIAGNOSIS — F10188 Alcohol abuse with other alcohol-induced disorder: Secondary | ICD-10-CM | POA: Diagnosis not present

## 2021-08-29 DIAGNOSIS — K2980 Duodenitis without bleeding: Secondary | ICD-10-CM | POA: Diagnosis not present

## 2021-08-29 DIAGNOSIS — E785 Hyperlipidemia, unspecified: Secondary | ICD-10-CM | POA: Diagnosis not present

## 2021-08-29 DIAGNOSIS — D689 Coagulation defect, unspecified: Secondary | ICD-10-CM | POA: Diagnosis not present

## 2021-08-29 DIAGNOSIS — F10188 Alcohol abuse with other alcohol-induced disorder: Secondary | ICD-10-CM | POA: Diagnosis not present

## 2021-08-29 DIAGNOSIS — K7010 Alcoholic hepatitis without ascites: Secondary | ICD-10-CM | POA: Diagnosis not present

## 2021-08-29 DIAGNOSIS — K767 Hepatorenal syndrome: Secondary | ICD-10-CM | POA: Diagnosis not present

## 2021-08-29 DIAGNOSIS — Z8616 Personal history of COVID-19: Secondary | ICD-10-CM | POA: Diagnosis not present

## 2021-08-29 DIAGNOSIS — R69 Illness, unspecified: Secondary | ICD-10-CM | POA: Diagnosis not present

## 2021-08-29 DIAGNOSIS — K219 Gastro-esophageal reflux disease without esophagitis: Secondary | ICD-10-CM | POA: Diagnosis not present

## 2021-08-29 DIAGNOSIS — K319 Disease of stomach and duodenum, unspecified: Secondary | ICD-10-CM | POA: Diagnosis not present

## 2021-08-29 DIAGNOSIS — D649 Anemia, unspecified: Secondary | ICD-10-CM | POA: Diagnosis not present

## 2021-08-30 DIAGNOSIS — K319 Disease of stomach and duodenum, unspecified: Secondary | ICD-10-CM | POA: Diagnosis not present

## 2021-08-30 DIAGNOSIS — K2980 Duodenitis without bleeding: Secondary | ICD-10-CM | POA: Diagnosis not present

## 2021-08-30 DIAGNOSIS — D649 Anemia, unspecified: Secondary | ICD-10-CM | POA: Diagnosis not present

## 2021-08-30 DIAGNOSIS — Z8616 Personal history of COVID-19: Secondary | ICD-10-CM | POA: Diagnosis not present

## 2021-08-30 DIAGNOSIS — E785 Hyperlipidemia, unspecified: Secondary | ICD-10-CM | POA: Diagnosis not present

## 2021-08-30 DIAGNOSIS — K7010 Alcoholic hepatitis without ascites: Secondary | ICD-10-CM | POA: Diagnosis not present

## 2021-08-30 DIAGNOSIS — K219 Gastro-esophageal reflux disease without esophagitis: Secondary | ICD-10-CM | POA: Diagnosis not present

## 2021-08-30 DIAGNOSIS — D689 Coagulation defect, unspecified: Secondary | ICD-10-CM | POA: Diagnosis not present

## 2021-08-30 DIAGNOSIS — R69 Illness, unspecified: Secondary | ICD-10-CM | POA: Diagnosis not present

## 2021-08-30 DIAGNOSIS — K767 Hepatorenal syndrome: Secondary | ICD-10-CM | POA: Diagnosis not present

## 2021-08-30 DIAGNOSIS — F10188 Alcohol abuse with other alcohol-induced disorder: Secondary | ICD-10-CM | POA: Diagnosis not present

## 2021-08-31 DIAGNOSIS — K219 Gastro-esophageal reflux disease without esophagitis: Secondary | ICD-10-CM | POA: Diagnosis not present

## 2021-08-31 DIAGNOSIS — R69 Illness, unspecified: Secondary | ICD-10-CM | POA: Diagnosis not present

## 2021-08-31 DIAGNOSIS — K319 Disease of stomach and duodenum, unspecified: Secondary | ICD-10-CM | POA: Diagnosis not present

## 2021-08-31 DIAGNOSIS — K2980 Duodenitis without bleeding: Secondary | ICD-10-CM | POA: Diagnosis not present

## 2021-08-31 DIAGNOSIS — K7010 Alcoholic hepatitis without ascites: Secondary | ICD-10-CM | POA: Diagnosis not present

## 2021-08-31 DIAGNOSIS — E785 Hyperlipidemia, unspecified: Secondary | ICD-10-CM | POA: Diagnosis not present

## 2021-08-31 DIAGNOSIS — K767 Hepatorenal syndrome: Secondary | ICD-10-CM | POA: Diagnosis not present

## 2021-08-31 DIAGNOSIS — Z8616 Personal history of COVID-19: Secondary | ICD-10-CM | POA: Diagnosis not present

## 2021-08-31 DIAGNOSIS — D689 Coagulation defect, unspecified: Secondary | ICD-10-CM | POA: Diagnosis not present

## 2021-08-31 DIAGNOSIS — D649 Anemia, unspecified: Secondary | ICD-10-CM | POA: Diagnosis not present

## 2021-08-31 DIAGNOSIS — F10188 Alcohol abuse with other alcohol-induced disorder: Secondary | ICD-10-CM | POA: Diagnosis not present

## 2021-09-01 DIAGNOSIS — F10188 Alcohol abuse with other alcohol-induced disorder: Secondary | ICD-10-CM | POA: Diagnosis not present

## 2021-09-01 DIAGNOSIS — D649 Anemia, unspecified: Secondary | ICD-10-CM | POA: Diagnosis not present

## 2021-09-01 DIAGNOSIS — K2980 Duodenitis without bleeding: Secondary | ICD-10-CM | POA: Diagnosis not present

## 2021-09-01 DIAGNOSIS — K767 Hepatorenal syndrome: Secondary | ICD-10-CM | POA: Diagnosis not present

## 2021-09-01 DIAGNOSIS — K319 Disease of stomach and duodenum, unspecified: Secondary | ICD-10-CM | POA: Diagnosis not present

## 2021-09-01 DIAGNOSIS — K7010 Alcoholic hepatitis without ascites: Secondary | ICD-10-CM | POA: Diagnosis not present

## 2021-09-01 DIAGNOSIS — Z8616 Personal history of COVID-19: Secondary | ICD-10-CM | POA: Diagnosis not present

## 2021-09-01 DIAGNOSIS — E785 Hyperlipidemia, unspecified: Secondary | ICD-10-CM | POA: Diagnosis not present

## 2021-09-01 DIAGNOSIS — K219 Gastro-esophageal reflux disease without esophagitis: Secondary | ICD-10-CM | POA: Diagnosis not present

## 2021-09-01 DIAGNOSIS — R69 Illness, unspecified: Secondary | ICD-10-CM | POA: Diagnosis not present

## 2021-09-01 DIAGNOSIS — D689 Coagulation defect, unspecified: Secondary | ICD-10-CM | POA: Diagnosis not present

## 2021-09-02 DIAGNOSIS — R69 Illness, unspecified: Secondary | ICD-10-CM | POA: Diagnosis not present

## 2021-09-02 DIAGNOSIS — K767 Hepatorenal syndrome: Secondary | ICD-10-CM | POA: Diagnosis not present

## 2021-09-02 DIAGNOSIS — Z8616 Personal history of COVID-19: Secondary | ICD-10-CM | POA: Diagnosis not present

## 2021-09-02 DIAGNOSIS — D649 Anemia, unspecified: Secondary | ICD-10-CM | POA: Diagnosis not present

## 2021-09-02 DIAGNOSIS — F10188 Alcohol abuse with other alcohol-induced disorder: Secondary | ICD-10-CM | POA: Diagnosis not present

## 2021-09-02 DIAGNOSIS — K219 Gastro-esophageal reflux disease without esophagitis: Secondary | ICD-10-CM | POA: Diagnosis not present

## 2021-09-02 DIAGNOSIS — K2980 Duodenitis without bleeding: Secondary | ICD-10-CM | POA: Diagnosis not present

## 2021-09-02 DIAGNOSIS — E785 Hyperlipidemia, unspecified: Secondary | ICD-10-CM | POA: Diagnosis not present

## 2021-09-02 DIAGNOSIS — D689 Coagulation defect, unspecified: Secondary | ICD-10-CM | POA: Diagnosis not present

## 2021-09-02 DIAGNOSIS — K319 Disease of stomach and duodenum, unspecified: Secondary | ICD-10-CM | POA: Diagnosis not present

## 2021-09-02 DIAGNOSIS — K7010 Alcoholic hepatitis without ascites: Secondary | ICD-10-CM | POA: Diagnosis not present

## 2021-09-03 DIAGNOSIS — K319 Disease of stomach and duodenum, unspecified: Secondary | ICD-10-CM | POA: Diagnosis not present

## 2021-09-03 DIAGNOSIS — K7010 Alcoholic hepatitis without ascites: Secondary | ICD-10-CM | POA: Diagnosis not present

## 2021-09-03 DIAGNOSIS — K2980 Duodenitis without bleeding: Secondary | ICD-10-CM | POA: Diagnosis not present

## 2021-09-03 DIAGNOSIS — R69 Illness, unspecified: Secondary | ICD-10-CM | POA: Diagnosis not present

## 2021-09-03 DIAGNOSIS — D689 Coagulation defect, unspecified: Secondary | ICD-10-CM | POA: Diagnosis not present

## 2021-09-03 DIAGNOSIS — E785 Hyperlipidemia, unspecified: Secondary | ICD-10-CM | POA: Diagnosis not present

## 2021-09-03 DIAGNOSIS — Z8616 Personal history of COVID-19: Secondary | ICD-10-CM | POA: Diagnosis not present

## 2021-09-03 DIAGNOSIS — D649 Anemia, unspecified: Secondary | ICD-10-CM | POA: Diagnosis not present

## 2021-09-03 DIAGNOSIS — K219 Gastro-esophageal reflux disease without esophagitis: Secondary | ICD-10-CM | POA: Diagnosis not present

## 2021-09-03 DIAGNOSIS — F10188 Alcohol abuse with other alcohol-induced disorder: Secondary | ICD-10-CM | POA: Diagnosis not present

## 2021-09-03 DIAGNOSIS — K767 Hepatorenal syndrome: Secondary | ICD-10-CM | POA: Diagnosis not present

## 2021-09-04 DIAGNOSIS — K2980 Duodenitis without bleeding: Secondary | ICD-10-CM | POA: Diagnosis not present

## 2021-09-04 DIAGNOSIS — E785 Hyperlipidemia, unspecified: Secondary | ICD-10-CM | POA: Diagnosis not present

## 2021-09-04 DIAGNOSIS — R69 Illness, unspecified: Secondary | ICD-10-CM | POA: Diagnosis not present

## 2021-09-04 DIAGNOSIS — F10188 Alcohol abuse with other alcohol-induced disorder: Secondary | ICD-10-CM | POA: Diagnosis not present

## 2021-09-04 DIAGNOSIS — Z8616 Personal history of COVID-19: Secondary | ICD-10-CM | POA: Diagnosis not present

## 2021-09-04 DIAGNOSIS — K7010 Alcoholic hepatitis without ascites: Secondary | ICD-10-CM | POA: Diagnosis not present

## 2021-09-04 DIAGNOSIS — D689 Coagulation defect, unspecified: Secondary | ICD-10-CM | POA: Diagnosis not present

## 2021-09-04 DIAGNOSIS — K319 Disease of stomach and duodenum, unspecified: Secondary | ICD-10-CM | POA: Diagnosis not present

## 2021-09-04 DIAGNOSIS — K219 Gastro-esophageal reflux disease without esophagitis: Secondary | ICD-10-CM | POA: Diagnosis not present

## 2021-09-04 DIAGNOSIS — D649 Anemia, unspecified: Secondary | ICD-10-CM | POA: Diagnosis not present

## 2021-09-04 DIAGNOSIS — K767 Hepatorenal syndrome: Secondary | ICD-10-CM | POA: Diagnosis not present

## 2021-09-05 DIAGNOSIS — R69 Illness, unspecified: Secondary | ICD-10-CM | POA: Diagnosis not present

## 2021-09-05 DIAGNOSIS — D649 Anemia, unspecified: Secondary | ICD-10-CM | POA: Diagnosis not present

## 2021-09-05 DIAGNOSIS — K2980 Duodenitis without bleeding: Secondary | ICD-10-CM | POA: Diagnosis not present

## 2021-09-05 DIAGNOSIS — K319 Disease of stomach and duodenum, unspecified: Secondary | ICD-10-CM | POA: Diagnosis not present

## 2021-09-05 DIAGNOSIS — K767 Hepatorenal syndrome: Secondary | ICD-10-CM | POA: Diagnosis not present

## 2021-09-05 DIAGNOSIS — E785 Hyperlipidemia, unspecified: Secondary | ICD-10-CM | POA: Diagnosis not present

## 2021-09-05 DIAGNOSIS — K7010 Alcoholic hepatitis without ascites: Secondary | ICD-10-CM | POA: Diagnosis not present

## 2021-09-05 DIAGNOSIS — F10188 Alcohol abuse with other alcohol-induced disorder: Secondary | ICD-10-CM | POA: Diagnosis not present

## 2021-09-05 DIAGNOSIS — D689 Coagulation defect, unspecified: Secondary | ICD-10-CM | POA: Diagnosis not present

## 2021-09-05 DIAGNOSIS — Z8616 Personal history of COVID-19: Secondary | ICD-10-CM | POA: Diagnosis not present

## 2021-09-05 DIAGNOSIS — K219 Gastro-esophageal reflux disease without esophagitis: Secondary | ICD-10-CM | POA: Diagnosis not present

## 2021-09-06 DIAGNOSIS — R69 Illness, unspecified: Secondary | ICD-10-CM | POA: Diagnosis not present

## 2021-09-06 DIAGNOSIS — K7010 Alcoholic hepatitis without ascites: Secondary | ICD-10-CM | POA: Diagnosis not present

## 2021-09-06 DIAGNOSIS — D689 Coagulation defect, unspecified: Secondary | ICD-10-CM | POA: Diagnosis not present

## 2021-09-06 DIAGNOSIS — F10188 Alcohol abuse with other alcohol-induced disorder: Secondary | ICD-10-CM | POA: Diagnosis not present

## 2021-09-06 DIAGNOSIS — K319 Disease of stomach and duodenum, unspecified: Secondary | ICD-10-CM | POA: Diagnosis not present

## 2021-09-06 DIAGNOSIS — D649 Anemia, unspecified: Secondary | ICD-10-CM | POA: Diagnosis not present

## 2021-09-06 DIAGNOSIS — E785 Hyperlipidemia, unspecified: Secondary | ICD-10-CM | POA: Diagnosis not present

## 2021-09-06 DIAGNOSIS — K2980 Duodenitis without bleeding: Secondary | ICD-10-CM | POA: Diagnosis not present

## 2021-09-06 DIAGNOSIS — K219 Gastro-esophageal reflux disease without esophagitis: Secondary | ICD-10-CM | POA: Diagnosis not present

## 2021-09-06 DIAGNOSIS — K767 Hepatorenal syndrome: Secondary | ICD-10-CM | POA: Diagnosis not present

## 2021-09-06 DIAGNOSIS — Z8616 Personal history of COVID-19: Secondary | ICD-10-CM | POA: Diagnosis not present

## 2021-09-07 DIAGNOSIS — Z8616 Personal history of COVID-19: Secondary | ICD-10-CM | POA: Diagnosis not present

## 2021-09-07 DIAGNOSIS — D689 Coagulation defect, unspecified: Secondary | ICD-10-CM | POA: Diagnosis not present

## 2021-09-07 DIAGNOSIS — E785 Hyperlipidemia, unspecified: Secondary | ICD-10-CM | POA: Diagnosis not present

## 2021-09-07 DIAGNOSIS — F10188 Alcohol abuse with other alcohol-induced disorder: Secondary | ICD-10-CM | POA: Diagnosis not present

## 2021-09-07 DIAGNOSIS — K2980 Duodenitis without bleeding: Secondary | ICD-10-CM | POA: Diagnosis not present

## 2021-09-07 DIAGNOSIS — D649 Anemia, unspecified: Secondary | ICD-10-CM | POA: Diagnosis not present

## 2021-09-07 DIAGNOSIS — K767 Hepatorenal syndrome: Secondary | ICD-10-CM | POA: Diagnosis not present

## 2021-09-07 DIAGNOSIS — K219 Gastro-esophageal reflux disease without esophagitis: Secondary | ICD-10-CM | POA: Diagnosis not present

## 2021-09-07 DIAGNOSIS — K319 Disease of stomach and duodenum, unspecified: Secondary | ICD-10-CM | POA: Diagnosis not present

## 2021-09-07 DIAGNOSIS — R69 Illness, unspecified: Secondary | ICD-10-CM | POA: Diagnosis not present

## 2021-09-07 DIAGNOSIS — K7010 Alcoholic hepatitis without ascites: Secondary | ICD-10-CM | POA: Diagnosis not present

## 2021-09-08 DIAGNOSIS — F10188 Alcohol abuse with other alcohol-induced disorder: Secondary | ICD-10-CM | POA: Diagnosis not present

## 2021-09-08 DIAGNOSIS — K2980 Duodenitis without bleeding: Secondary | ICD-10-CM | POA: Diagnosis not present

## 2021-09-08 DIAGNOSIS — Z8616 Personal history of COVID-19: Secondary | ICD-10-CM | POA: Diagnosis not present

## 2021-09-08 DIAGNOSIS — D649 Anemia, unspecified: Secondary | ICD-10-CM | POA: Diagnosis not present

## 2021-09-08 DIAGNOSIS — K319 Disease of stomach and duodenum, unspecified: Secondary | ICD-10-CM | POA: Diagnosis not present

## 2021-09-08 DIAGNOSIS — K7010 Alcoholic hepatitis without ascites: Secondary | ICD-10-CM | POA: Diagnosis not present

## 2021-09-08 DIAGNOSIS — E785 Hyperlipidemia, unspecified: Secondary | ICD-10-CM | POA: Diagnosis not present

## 2021-09-08 DIAGNOSIS — R69 Illness, unspecified: Secondary | ICD-10-CM | POA: Diagnosis not present

## 2021-09-08 DIAGNOSIS — K767 Hepatorenal syndrome: Secondary | ICD-10-CM | POA: Diagnosis not present

## 2021-09-08 DIAGNOSIS — D689 Coagulation defect, unspecified: Secondary | ICD-10-CM | POA: Diagnosis not present

## 2021-09-08 DIAGNOSIS — K219 Gastro-esophageal reflux disease without esophagitis: Secondary | ICD-10-CM | POA: Diagnosis not present

## 2021-09-09 DIAGNOSIS — K319 Disease of stomach and duodenum, unspecified: Secondary | ICD-10-CM | POA: Diagnosis not present

## 2021-09-09 DIAGNOSIS — E785 Hyperlipidemia, unspecified: Secondary | ICD-10-CM | POA: Diagnosis not present

## 2021-09-09 DIAGNOSIS — K767 Hepatorenal syndrome: Secondary | ICD-10-CM | POA: Diagnosis not present

## 2021-09-09 DIAGNOSIS — K219 Gastro-esophageal reflux disease without esophagitis: Secondary | ICD-10-CM | POA: Diagnosis not present

## 2021-09-09 DIAGNOSIS — D649 Anemia, unspecified: Secondary | ICD-10-CM | POA: Diagnosis not present

## 2021-09-09 DIAGNOSIS — K7010 Alcoholic hepatitis without ascites: Secondary | ICD-10-CM | POA: Diagnosis not present

## 2021-09-09 DIAGNOSIS — F10188 Alcohol abuse with other alcohol-induced disorder: Secondary | ICD-10-CM | POA: Diagnosis not present

## 2021-09-09 DIAGNOSIS — Z8616 Personal history of COVID-19: Secondary | ICD-10-CM | POA: Diagnosis not present

## 2021-09-09 DIAGNOSIS — K2980 Duodenitis without bleeding: Secondary | ICD-10-CM | POA: Diagnosis not present

## 2021-09-09 DIAGNOSIS — D689 Coagulation defect, unspecified: Secondary | ICD-10-CM | POA: Diagnosis not present

## 2021-09-09 DIAGNOSIS — R69 Illness, unspecified: Secondary | ICD-10-CM | POA: Diagnosis not present

## 2021-09-10 DIAGNOSIS — K219 Gastro-esophageal reflux disease without esophagitis: Secondary | ICD-10-CM | POA: Diagnosis not present

## 2021-09-10 DIAGNOSIS — R69 Illness, unspecified: Secondary | ICD-10-CM | POA: Diagnosis not present

## 2021-09-10 DIAGNOSIS — K767 Hepatorenal syndrome: Secondary | ICD-10-CM | POA: Diagnosis not present

## 2021-09-10 DIAGNOSIS — D649 Anemia, unspecified: Secondary | ICD-10-CM | POA: Diagnosis not present

## 2021-09-10 DIAGNOSIS — K2980 Duodenitis without bleeding: Secondary | ICD-10-CM | POA: Diagnosis not present

## 2021-09-10 DIAGNOSIS — K7010 Alcoholic hepatitis without ascites: Secondary | ICD-10-CM | POA: Diagnosis not present

## 2021-09-10 DIAGNOSIS — F10188 Alcohol abuse with other alcohol-induced disorder: Secondary | ICD-10-CM | POA: Diagnosis not present

## 2021-09-10 DIAGNOSIS — Z8616 Personal history of COVID-19: Secondary | ICD-10-CM | POA: Diagnosis not present

## 2021-09-10 DIAGNOSIS — D689 Coagulation defect, unspecified: Secondary | ICD-10-CM | POA: Diagnosis not present

## 2021-09-10 DIAGNOSIS — K319 Disease of stomach and duodenum, unspecified: Secondary | ICD-10-CM | POA: Diagnosis not present

## 2021-09-10 DIAGNOSIS — E785 Hyperlipidemia, unspecified: Secondary | ICD-10-CM | POA: Diagnosis not present

## 2021-09-11 DIAGNOSIS — D649 Anemia, unspecified: Secondary | ICD-10-CM | POA: Diagnosis not present

## 2021-09-11 DIAGNOSIS — K2980 Duodenitis without bleeding: Secondary | ICD-10-CM | POA: Diagnosis not present

## 2021-09-11 DIAGNOSIS — F10188 Alcohol abuse with other alcohol-induced disorder: Secondary | ICD-10-CM | POA: Diagnosis not present

## 2021-09-11 DIAGNOSIS — K7010 Alcoholic hepatitis without ascites: Secondary | ICD-10-CM | POA: Diagnosis not present

## 2021-09-11 DIAGNOSIS — D689 Coagulation defect, unspecified: Secondary | ICD-10-CM | POA: Diagnosis not present

## 2021-09-11 DIAGNOSIS — E785 Hyperlipidemia, unspecified: Secondary | ICD-10-CM | POA: Diagnosis not present

## 2021-09-11 DIAGNOSIS — R69 Illness, unspecified: Secondary | ICD-10-CM | POA: Diagnosis not present

## 2021-09-11 DIAGNOSIS — K219 Gastro-esophageal reflux disease without esophagitis: Secondary | ICD-10-CM | POA: Diagnosis not present

## 2021-09-11 DIAGNOSIS — Z8616 Personal history of COVID-19: Secondary | ICD-10-CM | POA: Diagnosis not present

## 2021-09-11 DIAGNOSIS — K319 Disease of stomach and duodenum, unspecified: Secondary | ICD-10-CM | POA: Diagnosis not present

## 2021-09-11 DIAGNOSIS — K767 Hepatorenal syndrome: Secondary | ICD-10-CM | POA: Diagnosis not present

## 2021-09-12 DIAGNOSIS — K219 Gastro-esophageal reflux disease without esophagitis: Secondary | ICD-10-CM | POA: Diagnosis not present

## 2021-09-12 DIAGNOSIS — Z8616 Personal history of COVID-19: Secondary | ICD-10-CM | POA: Diagnosis not present

## 2021-09-12 DIAGNOSIS — K319 Disease of stomach and duodenum, unspecified: Secondary | ICD-10-CM | POA: Diagnosis not present

## 2021-09-12 DIAGNOSIS — D689 Coagulation defect, unspecified: Secondary | ICD-10-CM | POA: Diagnosis not present

## 2021-09-12 DIAGNOSIS — F10188 Alcohol abuse with other alcohol-induced disorder: Secondary | ICD-10-CM | POA: Diagnosis not present

## 2021-09-12 DIAGNOSIS — K7010 Alcoholic hepatitis without ascites: Secondary | ICD-10-CM | POA: Diagnosis not present

## 2021-09-12 DIAGNOSIS — K767 Hepatorenal syndrome: Secondary | ICD-10-CM | POA: Diagnosis not present

## 2021-09-12 DIAGNOSIS — E785 Hyperlipidemia, unspecified: Secondary | ICD-10-CM | POA: Diagnosis not present

## 2021-09-12 DIAGNOSIS — D649 Anemia, unspecified: Secondary | ICD-10-CM | POA: Diagnosis not present

## 2021-09-12 DIAGNOSIS — R69 Illness, unspecified: Secondary | ICD-10-CM | POA: Diagnosis not present

## 2021-09-12 DIAGNOSIS — K2980 Duodenitis without bleeding: Secondary | ICD-10-CM | POA: Diagnosis not present

## 2021-09-13 DIAGNOSIS — K219 Gastro-esophageal reflux disease without esophagitis: Secondary | ICD-10-CM | POA: Diagnosis not present

## 2021-09-13 DIAGNOSIS — E785 Hyperlipidemia, unspecified: Secondary | ICD-10-CM | POA: Diagnosis not present

## 2021-09-13 DIAGNOSIS — D689 Coagulation defect, unspecified: Secondary | ICD-10-CM | POA: Diagnosis not present

## 2021-09-13 DIAGNOSIS — R69 Illness, unspecified: Secondary | ICD-10-CM | POA: Diagnosis not present

## 2021-09-13 DIAGNOSIS — K319 Disease of stomach and duodenum, unspecified: Secondary | ICD-10-CM | POA: Diagnosis not present

## 2021-09-13 DIAGNOSIS — D649 Anemia, unspecified: Secondary | ICD-10-CM | POA: Diagnosis not present

## 2021-09-13 DIAGNOSIS — F10188 Alcohol abuse with other alcohol-induced disorder: Secondary | ICD-10-CM | POA: Diagnosis not present

## 2021-09-13 DIAGNOSIS — Z8616 Personal history of COVID-19: Secondary | ICD-10-CM | POA: Diagnosis not present

## 2021-09-13 DIAGNOSIS — K7010 Alcoholic hepatitis without ascites: Secondary | ICD-10-CM | POA: Diagnosis not present

## 2021-09-13 DIAGNOSIS — K767 Hepatorenal syndrome: Secondary | ICD-10-CM | POA: Diagnosis not present

## 2021-09-13 DIAGNOSIS — K2980 Duodenitis without bleeding: Secondary | ICD-10-CM | POA: Diagnosis not present

## 2021-09-14 DIAGNOSIS — Z8616 Personal history of COVID-19: Secondary | ICD-10-CM | POA: Diagnosis not present

## 2021-09-14 DIAGNOSIS — K7010 Alcoholic hepatitis without ascites: Secondary | ICD-10-CM | POA: Diagnosis not present

## 2021-09-14 DIAGNOSIS — R69 Illness, unspecified: Secondary | ICD-10-CM | POA: Diagnosis not present

## 2021-09-14 DIAGNOSIS — K319 Disease of stomach and duodenum, unspecified: Secondary | ICD-10-CM | POA: Diagnosis not present

## 2021-09-14 DIAGNOSIS — K219 Gastro-esophageal reflux disease without esophagitis: Secondary | ICD-10-CM | POA: Diagnosis not present

## 2021-09-14 DIAGNOSIS — D649 Anemia, unspecified: Secondary | ICD-10-CM | POA: Diagnosis not present

## 2021-09-14 DIAGNOSIS — K2980 Duodenitis without bleeding: Secondary | ICD-10-CM | POA: Diagnosis not present

## 2021-09-14 DIAGNOSIS — F10188 Alcohol abuse with other alcohol-induced disorder: Secondary | ICD-10-CM | POA: Diagnosis not present

## 2021-09-14 DIAGNOSIS — D689 Coagulation defect, unspecified: Secondary | ICD-10-CM | POA: Diagnosis not present

## 2021-09-14 DIAGNOSIS — K767 Hepatorenal syndrome: Secondary | ICD-10-CM | POA: Diagnosis not present

## 2021-09-14 DIAGNOSIS — E785 Hyperlipidemia, unspecified: Secondary | ICD-10-CM | POA: Diagnosis not present

## 2021-09-15 DIAGNOSIS — K767 Hepatorenal syndrome: Secondary | ICD-10-CM | POA: Diagnosis not present

## 2021-09-15 DIAGNOSIS — D689 Coagulation defect, unspecified: Secondary | ICD-10-CM | POA: Diagnosis not present

## 2021-09-15 DIAGNOSIS — F10188 Alcohol abuse with other alcohol-induced disorder: Secondary | ICD-10-CM | POA: Diagnosis not present

## 2021-09-15 DIAGNOSIS — R69 Illness, unspecified: Secondary | ICD-10-CM | POA: Diagnosis not present

## 2021-09-15 DIAGNOSIS — D649 Anemia, unspecified: Secondary | ICD-10-CM | POA: Diagnosis not present

## 2021-09-15 DIAGNOSIS — K219 Gastro-esophageal reflux disease without esophagitis: Secondary | ICD-10-CM | POA: Diagnosis not present

## 2021-09-15 DIAGNOSIS — K7010 Alcoholic hepatitis without ascites: Secondary | ICD-10-CM | POA: Diagnosis not present

## 2021-09-15 DIAGNOSIS — E785 Hyperlipidemia, unspecified: Secondary | ICD-10-CM | POA: Diagnosis not present

## 2021-09-15 DIAGNOSIS — K319 Disease of stomach and duodenum, unspecified: Secondary | ICD-10-CM | POA: Diagnosis not present

## 2021-09-15 DIAGNOSIS — K2980 Duodenitis without bleeding: Secondary | ICD-10-CM | POA: Diagnosis not present

## 2021-09-15 DIAGNOSIS — Z8616 Personal history of COVID-19: Secondary | ICD-10-CM | POA: Diagnosis not present

## 2021-09-16 DIAGNOSIS — K219 Gastro-esophageal reflux disease without esophagitis: Secondary | ICD-10-CM | POA: Diagnosis not present

## 2021-09-16 DIAGNOSIS — D649 Anemia, unspecified: Secondary | ICD-10-CM | POA: Diagnosis not present

## 2021-09-16 DIAGNOSIS — K7010 Alcoholic hepatitis without ascites: Secondary | ICD-10-CM | POA: Diagnosis not present

## 2021-09-16 DIAGNOSIS — E785 Hyperlipidemia, unspecified: Secondary | ICD-10-CM | POA: Diagnosis not present

## 2021-09-16 DIAGNOSIS — Z8616 Personal history of COVID-19: Secondary | ICD-10-CM | POA: Diagnosis not present

## 2021-09-16 DIAGNOSIS — K2980 Duodenitis without bleeding: Secondary | ICD-10-CM | POA: Diagnosis not present

## 2021-09-16 DIAGNOSIS — K319 Disease of stomach and duodenum, unspecified: Secondary | ICD-10-CM | POA: Diagnosis not present

## 2021-09-16 DIAGNOSIS — K767 Hepatorenal syndrome: Secondary | ICD-10-CM | POA: Diagnosis not present

## 2021-09-16 DIAGNOSIS — F10188 Alcohol abuse with other alcohol-induced disorder: Secondary | ICD-10-CM | POA: Diagnosis not present

## 2021-09-16 DIAGNOSIS — D689 Coagulation defect, unspecified: Secondary | ICD-10-CM | POA: Diagnosis not present

## 2021-09-16 DIAGNOSIS — R69 Illness, unspecified: Secondary | ICD-10-CM | POA: Diagnosis not present

## 2021-09-17 DIAGNOSIS — D689 Coagulation defect, unspecified: Secondary | ICD-10-CM | POA: Diagnosis not present

## 2021-09-17 DIAGNOSIS — K2980 Duodenitis without bleeding: Secondary | ICD-10-CM | POA: Diagnosis not present

## 2021-09-17 DIAGNOSIS — Z8616 Personal history of COVID-19: Secondary | ICD-10-CM | POA: Diagnosis not present

## 2021-09-17 DIAGNOSIS — E785 Hyperlipidemia, unspecified: Secondary | ICD-10-CM | POA: Diagnosis not present

## 2021-09-17 DIAGNOSIS — R69 Illness, unspecified: Secondary | ICD-10-CM | POA: Diagnosis not present

## 2021-09-17 DIAGNOSIS — K767 Hepatorenal syndrome: Secondary | ICD-10-CM | POA: Diagnosis not present

## 2021-09-17 DIAGNOSIS — K319 Disease of stomach and duodenum, unspecified: Secondary | ICD-10-CM | POA: Diagnosis not present

## 2021-09-17 DIAGNOSIS — K219 Gastro-esophageal reflux disease without esophagitis: Secondary | ICD-10-CM | POA: Diagnosis not present

## 2021-09-17 DIAGNOSIS — K7010 Alcoholic hepatitis without ascites: Secondary | ICD-10-CM | POA: Diagnosis not present

## 2021-09-17 DIAGNOSIS — F10188 Alcohol abuse with other alcohol-induced disorder: Secondary | ICD-10-CM | POA: Diagnosis not present

## 2021-09-17 DIAGNOSIS — D649 Anemia, unspecified: Secondary | ICD-10-CM | POA: Diagnosis not present

## 2021-09-18 DIAGNOSIS — D689 Coagulation defect, unspecified: Secondary | ICD-10-CM | POA: Diagnosis not present

## 2021-09-18 DIAGNOSIS — E785 Hyperlipidemia, unspecified: Secondary | ICD-10-CM | POA: Diagnosis not present

## 2021-09-18 DIAGNOSIS — K319 Disease of stomach and duodenum, unspecified: Secondary | ICD-10-CM | POA: Diagnosis not present

## 2021-09-18 DIAGNOSIS — Z8616 Personal history of COVID-19: Secondary | ICD-10-CM | POA: Diagnosis not present

## 2021-09-18 DIAGNOSIS — R69 Illness, unspecified: Secondary | ICD-10-CM | POA: Diagnosis not present

## 2021-09-18 DIAGNOSIS — K767 Hepatorenal syndrome: Secondary | ICD-10-CM | POA: Diagnosis not present

## 2021-09-18 DIAGNOSIS — K7010 Alcoholic hepatitis without ascites: Secondary | ICD-10-CM | POA: Diagnosis not present

## 2021-09-18 DIAGNOSIS — D649 Anemia, unspecified: Secondary | ICD-10-CM | POA: Diagnosis not present

## 2021-09-18 DIAGNOSIS — K2980 Duodenitis without bleeding: Secondary | ICD-10-CM | POA: Diagnosis not present

## 2021-09-18 DIAGNOSIS — F10188 Alcohol abuse with other alcohol-induced disorder: Secondary | ICD-10-CM | POA: Diagnosis not present

## 2021-09-18 DIAGNOSIS — K219 Gastro-esophageal reflux disease without esophagitis: Secondary | ICD-10-CM | POA: Diagnosis not present

## 2021-09-19 DIAGNOSIS — F10188 Alcohol abuse with other alcohol-induced disorder: Secondary | ICD-10-CM | POA: Diagnosis not present

## 2021-09-19 DIAGNOSIS — D689 Coagulation defect, unspecified: Secondary | ICD-10-CM | POA: Diagnosis not present

## 2021-09-19 DIAGNOSIS — K319 Disease of stomach and duodenum, unspecified: Secondary | ICD-10-CM | POA: Diagnosis not present

## 2021-09-19 DIAGNOSIS — E785 Hyperlipidemia, unspecified: Secondary | ICD-10-CM | POA: Diagnosis not present

## 2021-09-19 DIAGNOSIS — K767 Hepatorenal syndrome: Secondary | ICD-10-CM | POA: Diagnosis not present

## 2021-09-19 DIAGNOSIS — K7010 Alcoholic hepatitis without ascites: Secondary | ICD-10-CM | POA: Diagnosis not present

## 2021-09-19 DIAGNOSIS — R69 Illness, unspecified: Secondary | ICD-10-CM | POA: Diagnosis not present

## 2021-09-19 DIAGNOSIS — K2980 Duodenitis without bleeding: Secondary | ICD-10-CM | POA: Diagnosis not present

## 2021-09-19 DIAGNOSIS — K219 Gastro-esophageal reflux disease without esophagitis: Secondary | ICD-10-CM | POA: Diagnosis not present

## 2021-09-19 DIAGNOSIS — D649 Anemia, unspecified: Secondary | ICD-10-CM | POA: Diagnosis not present

## 2021-09-19 DIAGNOSIS — Z8616 Personal history of COVID-19: Secondary | ICD-10-CM | POA: Diagnosis not present

## 2021-09-20 DIAGNOSIS — K7010 Alcoholic hepatitis without ascites: Secondary | ICD-10-CM | POA: Diagnosis not present

## 2021-09-20 DIAGNOSIS — K2980 Duodenitis without bleeding: Secondary | ICD-10-CM | POA: Diagnosis not present

## 2021-09-20 DIAGNOSIS — Z8616 Personal history of COVID-19: Secondary | ICD-10-CM | POA: Diagnosis not present

## 2021-09-20 DIAGNOSIS — R69 Illness, unspecified: Secondary | ICD-10-CM | POA: Diagnosis not present

## 2021-09-20 DIAGNOSIS — D649 Anemia, unspecified: Secondary | ICD-10-CM | POA: Diagnosis not present

## 2021-09-20 DIAGNOSIS — K767 Hepatorenal syndrome: Secondary | ICD-10-CM | POA: Diagnosis not present

## 2021-09-20 DIAGNOSIS — F10188 Alcohol abuse with other alcohol-induced disorder: Secondary | ICD-10-CM | POA: Diagnosis not present

## 2021-09-20 DIAGNOSIS — K319 Disease of stomach and duodenum, unspecified: Secondary | ICD-10-CM | POA: Diagnosis not present

## 2021-09-20 DIAGNOSIS — D689 Coagulation defect, unspecified: Secondary | ICD-10-CM | POA: Diagnosis not present

## 2021-09-20 DIAGNOSIS — E785 Hyperlipidemia, unspecified: Secondary | ICD-10-CM | POA: Diagnosis not present

## 2021-09-20 DIAGNOSIS — K219 Gastro-esophageal reflux disease without esophagitis: Secondary | ICD-10-CM | POA: Diagnosis not present

## 2021-09-21 DIAGNOSIS — K767 Hepatorenal syndrome: Secondary | ICD-10-CM | POA: Diagnosis not present

## 2021-09-21 DIAGNOSIS — K219 Gastro-esophageal reflux disease without esophagitis: Secondary | ICD-10-CM | POA: Diagnosis not present

## 2021-09-21 DIAGNOSIS — K319 Disease of stomach and duodenum, unspecified: Secondary | ICD-10-CM | POA: Diagnosis not present

## 2021-09-21 DIAGNOSIS — D649 Anemia, unspecified: Secondary | ICD-10-CM | POA: Diagnosis not present

## 2021-09-21 DIAGNOSIS — R69 Illness, unspecified: Secondary | ICD-10-CM | POA: Diagnosis not present

## 2021-09-21 DIAGNOSIS — D689 Coagulation defect, unspecified: Secondary | ICD-10-CM | POA: Diagnosis not present

## 2021-09-21 DIAGNOSIS — E785 Hyperlipidemia, unspecified: Secondary | ICD-10-CM | POA: Diagnosis not present

## 2021-09-21 DIAGNOSIS — K7010 Alcoholic hepatitis without ascites: Secondary | ICD-10-CM | POA: Diagnosis not present

## 2021-09-21 DIAGNOSIS — Z8616 Personal history of COVID-19: Secondary | ICD-10-CM | POA: Diagnosis not present

## 2021-09-21 DIAGNOSIS — K2980 Duodenitis without bleeding: Secondary | ICD-10-CM | POA: Diagnosis not present

## 2021-09-21 DIAGNOSIS — F10188 Alcohol abuse with other alcohol-induced disorder: Secondary | ICD-10-CM | POA: Diagnosis not present

## 2021-09-22 DIAGNOSIS — E785 Hyperlipidemia, unspecified: Secondary | ICD-10-CM | POA: Diagnosis not present

## 2021-09-22 DIAGNOSIS — R69 Illness, unspecified: Secondary | ICD-10-CM | POA: Diagnosis not present

## 2021-09-22 DIAGNOSIS — F10188 Alcohol abuse with other alcohol-induced disorder: Secondary | ICD-10-CM | POA: Diagnosis not present

## 2021-09-22 DIAGNOSIS — Z8616 Personal history of COVID-19: Secondary | ICD-10-CM | POA: Diagnosis not present

## 2021-09-22 DIAGNOSIS — D689 Coagulation defect, unspecified: Secondary | ICD-10-CM | POA: Diagnosis not present

## 2021-09-22 DIAGNOSIS — K319 Disease of stomach and duodenum, unspecified: Secondary | ICD-10-CM | POA: Diagnosis not present

## 2021-09-22 DIAGNOSIS — D649 Anemia, unspecified: Secondary | ICD-10-CM | POA: Diagnosis not present

## 2021-09-22 DIAGNOSIS — K2980 Duodenitis without bleeding: Secondary | ICD-10-CM | POA: Diagnosis not present

## 2021-09-22 DIAGNOSIS — K7010 Alcoholic hepatitis without ascites: Secondary | ICD-10-CM | POA: Diagnosis not present

## 2021-09-22 DIAGNOSIS — K219 Gastro-esophageal reflux disease without esophagitis: Secondary | ICD-10-CM | POA: Diagnosis not present

## 2021-09-22 DIAGNOSIS — K767 Hepatorenal syndrome: Secondary | ICD-10-CM | POA: Diagnosis not present

## 2021-09-23 DIAGNOSIS — K219 Gastro-esophageal reflux disease without esophagitis: Secondary | ICD-10-CM | POA: Diagnosis not present

## 2021-09-23 DIAGNOSIS — F10188 Alcohol abuse with other alcohol-induced disorder: Secondary | ICD-10-CM | POA: Diagnosis not present

## 2021-09-23 DIAGNOSIS — D649 Anemia, unspecified: Secondary | ICD-10-CM | POA: Diagnosis not present

## 2021-09-23 DIAGNOSIS — Z8616 Personal history of COVID-19: Secondary | ICD-10-CM | POA: Diagnosis not present

## 2021-09-23 DIAGNOSIS — K7010 Alcoholic hepatitis without ascites: Secondary | ICD-10-CM | POA: Diagnosis not present

## 2021-09-23 DIAGNOSIS — R69 Illness, unspecified: Secondary | ICD-10-CM | POA: Diagnosis not present

## 2021-09-23 DIAGNOSIS — E785 Hyperlipidemia, unspecified: Secondary | ICD-10-CM | POA: Diagnosis not present

## 2021-09-23 DIAGNOSIS — K767 Hepatorenal syndrome: Secondary | ICD-10-CM | POA: Diagnosis not present

## 2021-09-23 DIAGNOSIS — K2980 Duodenitis without bleeding: Secondary | ICD-10-CM | POA: Diagnosis not present

## 2021-09-23 DIAGNOSIS — D689 Coagulation defect, unspecified: Secondary | ICD-10-CM | POA: Diagnosis not present

## 2021-09-23 DIAGNOSIS — K319 Disease of stomach and duodenum, unspecified: Secondary | ICD-10-CM | POA: Diagnosis not present

## 2021-09-24 DIAGNOSIS — D649 Anemia, unspecified: Secondary | ICD-10-CM | POA: Diagnosis not present

## 2021-09-24 DIAGNOSIS — K767 Hepatorenal syndrome: Secondary | ICD-10-CM | POA: Diagnosis not present

## 2021-09-24 DIAGNOSIS — K319 Disease of stomach and duodenum, unspecified: Secondary | ICD-10-CM | POA: Diagnosis not present

## 2021-09-24 DIAGNOSIS — D689 Coagulation defect, unspecified: Secondary | ICD-10-CM | POA: Diagnosis not present

## 2021-09-24 DIAGNOSIS — K2980 Duodenitis without bleeding: Secondary | ICD-10-CM | POA: Diagnosis not present

## 2021-09-24 DIAGNOSIS — Z8616 Personal history of COVID-19: Secondary | ICD-10-CM | POA: Diagnosis not present

## 2021-09-24 DIAGNOSIS — R69 Illness, unspecified: Secondary | ICD-10-CM | POA: Diagnosis not present

## 2021-09-24 DIAGNOSIS — F10188 Alcohol abuse with other alcohol-induced disorder: Secondary | ICD-10-CM | POA: Diagnosis not present

## 2021-09-24 DIAGNOSIS — E785 Hyperlipidemia, unspecified: Secondary | ICD-10-CM | POA: Diagnosis not present

## 2021-09-24 DIAGNOSIS — K7010 Alcoholic hepatitis without ascites: Secondary | ICD-10-CM | POA: Diagnosis not present

## 2021-09-24 DIAGNOSIS — K219 Gastro-esophageal reflux disease without esophagitis: Secondary | ICD-10-CM | POA: Diagnosis not present

## 2021-09-25 DIAGNOSIS — F10188 Alcohol abuse with other alcohol-induced disorder: Secondary | ICD-10-CM | POA: Diagnosis not present

## 2021-09-25 DIAGNOSIS — R69 Illness, unspecified: Secondary | ICD-10-CM | POA: Diagnosis not present

## 2021-09-25 DIAGNOSIS — Z8616 Personal history of COVID-19: Secondary | ICD-10-CM | POA: Diagnosis not present

## 2021-09-25 DIAGNOSIS — K319 Disease of stomach and duodenum, unspecified: Secondary | ICD-10-CM | POA: Diagnosis not present

## 2021-09-25 DIAGNOSIS — K7010 Alcoholic hepatitis without ascites: Secondary | ICD-10-CM | POA: Diagnosis not present

## 2021-09-25 DIAGNOSIS — K2980 Duodenitis without bleeding: Secondary | ICD-10-CM | POA: Diagnosis not present

## 2021-09-25 DIAGNOSIS — D689 Coagulation defect, unspecified: Secondary | ICD-10-CM | POA: Diagnosis not present

## 2021-09-25 DIAGNOSIS — E785 Hyperlipidemia, unspecified: Secondary | ICD-10-CM | POA: Diagnosis not present

## 2021-09-25 DIAGNOSIS — K767 Hepatorenal syndrome: Secondary | ICD-10-CM | POA: Diagnosis not present

## 2021-09-25 DIAGNOSIS — D649 Anemia, unspecified: Secondary | ICD-10-CM | POA: Diagnosis not present

## 2021-09-25 DIAGNOSIS — K219 Gastro-esophageal reflux disease without esophagitis: Secondary | ICD-10-CM | POA: Diagnosis not present

## 2021-09-26 DIAGNOSIS — F10188 Alcohol abuse with other alcohol-induced disorder: Secondary | ICD-10-CM | POA: Diagnosis not present

## 2021-09-26 DIAGNOSIS — D689 Coagulation defect, unspecified: Secondary | ICD-10-CM | POA: Diagnosis not present

## 2021-09-26 DIAGNOSIS — Z8616 Personal history of COVID-19: Secondary | ICD-10-CM | POA: Diagnosis not present

## 2021-09-26 DIAGNOSIS — R69 Illness, unspecified: Secondary | ICD-10-CM | POA: Diagnosis not present

## 2021-09-26 DIAGNOSIS — K7010 Alcoholic hepatitis without ascites: Secondary | ICD-10-CM | POA: Diagnosis not present

## 2021-09-26 DIAGNOSIS — E785 Hyperlipidemia, unspecified: Secondary | ICD-10-CM | POA: Diagnosis not present

## 2021-09-26 DIAGNOSIS — K767 Hepatorenal syndrome: Secondary | ICD-10-CM | POA: Diagnosis not present

## 2021-09-26 DIAGNOSIS — K319 Disease of stomach and duodenum, unspecified: Secondary | ICD-10-CM | POA: Diagnosis not present

## 2021-09-26 DIAGNOSIS — K2980 Duodenitis without bleeding: Secondary | ICD-10-CM | POA: Diagnosis not present

## 2021-09-26 DIAGNOSIS — K219 Gastro-esophageal reflux disease without esophagitis: Secondary | ICD-10-CM | POA: Diagnosis not present

## 2021-09-26 DIAGNOSIS — D649 Anemia, unspecified: Secondary | ICD-10-CM | POA: Diagnosis not present

## 2021-09-27 DIAGNOSIS — F10188 Alcohol abuse with other alcohol-induced disorder: Secondary | ICD-10-CM | POA: Diagnosis not present

## 2021-09-27 DIAGNOSIS — D689 Coagulation defect, unspecified: Secondary | ICD-10-CM | POA: Diagnosis not present

## 2021-09-27 DIAGNOSIS — D649 Anemia, unspecified: Secondary | ICD-10-CM | POA: Diagnosis not present

## 2021-09-27 DIAGNOSIS — K7010 Alcoholic hepatitis without ascites: Secondary | ICD-10-CM | POA: Diagnosis not present

## 2021-09-27 DIAGNOSIS — E785 Hyperlipidemia, unspecified: Secondary | ICD-10-CM | POA: Diagnosis not present

## 2021-09-27 DIAGNOSIS — K319 Disease of stomach and duodenum, unspecified: Secondary | ICD-10-CM | POA: Diagnosis not present

## 2021-09-27 DIAGNOSIS — Z8616 Personal history of COVID-19: Secondary | ICD-10-CM | POA: Diagnosis not present

## 2021-09-27 DIAGNOSIS — K767 Hepatorenal syndrome: Secondary | ICD-10-CM | POA: Diagnosis not present

## 2021-09-27 DIAGNOSIS — K219 Gastro-esophageal reflux disease without esophagitis: Secondary | ICD-10-CM | POA: Diagnosis not present

## 2021-09-27 DIAGNOSIS — K2980 Duodenitis without bleeding: Secondary | ICD-10-CM | POA: Diagnosis not present

## 2021-09-27 DIAGNOSIS — R69 Illness, unspecified: Secondary | ICD-10-CM | POA: Diagnosis not present

## 2021-09-28 DIAGNOSIS — K319 Disease of stomach and duodenum, unspecified: Secondary | ICD-10-CM | POA: Diagnosis not present

## 2021-09-28 DIAGNOSIS — R69 Illness, unspecified: Secondary | ICD-10-CM | POA: Diagnosis not present

## 2021-09-28 DIAGNOSIS — K219 Gastro-esophageal reflux disease without esophagitis: Secondary | ICD-10-CM | POA: Diagnosis not present

## 2021-09-28 DIAGNOSIS — D649 Anemia, unspecified: Secondary | ICD-10-CM | POA: Diagnosis not present

## 2021-09-28 DIAGNOSIS — K2980 Duodenitis without bleeding: Secondary | ICD-10-CM | POA: Diagnosis not present

## 2021-09-28 DIAGNOSIS — F10188 Alcohol abuse with other alcohol-induced disorder: Secondary | ICD-10-CM | POA: Diagnosis not present

## 2021-09-28 DIAGNOSIS — E785 Hyperlipidemia, unspecified: Secondary | ICD-10-CM | POA: Diagnosis not present

## 2021-09-28 DIAGNOSIS — K7010 Alcoholic hepatitis without ascites: Secondary | ICD-10-CM | POA: Diagnosis not present

## 2021-09-28 DIAGNOSIS — Z8616 Personal history of COVID-19: Secondary | ICD-10-CM | POA: Diagnosis not present

## 2021-09-28 DIAGNOSIS — K767 Hepatorenal syndrome: Secondary | ICD-10-CM | POA: Diagnosis not present

## 2021-09-28 DIAGNOSIS — D689 Coagulation defect, unspecified: Secondary | ICD-10-CM | POA: Diagnosis not present

## 2021-09-29 DIAGNOSIS — K2980 Duodenitis without bleeding: Secondary | ICD-10-CM | POA: Diagnosis not present

## 2021-09-29 DIAGNOSIS — K7010 Alcoholic hepatitis without ascites: Secondary | ICD-10-CM | POA: Diagnosis not present

## 2021-09-29 DIAGNOSIS — K319 Disease of stomach and duodenum, unspecified: Secondary | ICD-10-CM | POA: Diagnosis not present

## 2021-09-29 DIAGNOSIS — E785 Hyperlipidemia, unspecified: Secondary | ICD-10-CM | POA: Diagnosis not present

## 2021-09-29 DIAGNOSIS — K767 Hepatorenal syndrome: Secondary | ICD-10-CM | POA: Diagnosis not present

## 2021-09-29 DIAGNOSIS — R69 Illness, unspecified: Secondary | ICD-10-CM | POA: Diagnosis not present

## 2021-09-29 DIAGNOSIS — F10188 Alcohol abuse with other alcohol-induced disorder: Secondary | ICD-10-CM | POA: Diagnosis not present

## 2021-09-29 DIAGNOSIS — K219 Gastro-esophageal reflux disease without esophagitis: Secondary | ICD-10-CM | POA: Diagnosis not present

## 2021-09-29 DIAGNOSIS — Z8616 Personal history of COVID-19: Secondary | ICD-10-CM | POA: Diagnosis not present

## 2021-09-29 DIAGNOSIS — D689 Coagulation defect, unspecified: Secondary | ICD-10-CM | POA: Diagnosis not present

## 2021-09-29 DIAGNOSIS — D649 Anemia, unspecified: Secondary | ICD-10-CM | POA: Diagnosis not present

## 2021-09-30 DIAGNOSIS — R69 Illness, unspecified: Secondary | ICD-10-CM | POA: Diagnosis not present

## 2021-09-30 DIAGNOSIS — D649 Anemia, unspecified: Secondary | ICD-10-CM | POA: Diagnosis not present

## 2021-09-30 DIAGNOSIS — E785 Hyperlipidemia, unspecified: Secondary | ICD-10-CM | POA: Diagnosis not present

## 2021-09-30 DIAGNOSIS — D689 Coagulation defect, unspecified: Secondary | ICD-10-CM | POA: Diagnosis not present

## 2021-09-30 DIAGNOSIS — K219 Gastro-esophageal reflux disease without esophagitis: Secondary | ICD-10-CM | POA: Diagnosis not present

## 2021-09-30 DIAGNOSIS — F10188 Alcohol abuse with other alcohol-induced disorder: Secondary | ICD-10-CM | POA: Diagnosis not present

## 2021-09-30 DIAGNOSIS — K319 Disease of stomach and duodenum, unspecified: Secondary | ICD-10-CM | POA: Diagnosis not present

## 2021-09-30 DIAGNOSIS — K767 Hepatorenal syndrome: Secondary | ICD-10-CM | POA: Diagnosis not present

## 2021-09-30 DIAGNOSIS — Z8616 Personal history of COVID-19: Secondary | ICD-10-CM | POA: Diagnosis not present

## 2021-09-30 DIAGNOSIS — K2980 Duodenitis without bleeding: Secondary | ICD-10-CM | POA: Diagnosis not present

## 2021-09-30 DIAGNOSIS — K7010 Alcoholic hepatitis without ascites: Secondary | ICD-10-CM | POA: Diagnosis not present

## 2021-10-01 DIAGNOSIS — K219 Gastro-esophageal reflux disease without esophagitis: Secondary | ICD-10-CM | POA: Diagnosis not present

## 2021-10-01 DIAGNOSIS — Z8616 Personal history of COVID-19: Secondary | ICD-10-CM | POA: Diagnosis not present

## 2021-10-01 DIAGNOSIS — K7010 Alcoholic hepatitis without ascites: Secondary | ICD-10-CM | POA: Diagnosis not present

## 2021-10-01 DIAGNOSIS — K319 Disease of stomach and duodenum, unspecified: Secondary | ICD-10-CM | POA: Diagnosis not present

## 2021-10-01 DIAGNOSIS — F10188 Alcohol abuse with other alcohol-induced disorder: Secondary | ICD-10-CM | POA: Diagnosis not present

## 2021-10-01 DIAGNOSIS — D649 Anemia, unspecified: Secondary | ICD-10-CM | POA: Diagnosis not present

## 2021-10-01 DIAGNOSIS — K2980 Duodenitis without bleeding: Secondary | ICD-10-CM | POA: Diagnosis not present

## 2021-10-01 DIAGNOSIS — D689 Coagulation defect, unspecified: Secondary | ICD-10-CM | POA: Diagnosis not present

## 2021-10-01 DIAGNOSIS — E785 Hyperlipidemia, unspecified: Secondary | ICD-10-CM | POA: Diagnosis not present

## 2021-10-01 DIAGNOSIS — K767 Hepatorenal syndrome: Secondary | ICD-10-CM | POA: Diagnosis not present

## 2021-10-01 DIAGNOSIS — R69 Illness, unspecified: Secondary | ICD-10-CM | POA: Diagnosis not present

## 2021-10-02 DIAGNOSIS — K7010 Alcoholic hepatitis without ascites: Secondary | ICD-10-CM | POA: Diagnosis not present

## 2021-10-02 DIAGNOSIS — D649 Anemia, unspecified: Secondary | ICD-10-CM | POA: Diagnosis not present

## 2021-10-02 DIAGNOSIS — K767 Hepatorenal syndrome: Secondary | ICD-10-CM | POA: Diagnosis not present

## 2021-10-02 DIAGNOSIS — K319 Disease of stomach and duodenum, unspecified: Secondary | ICD-10-CM | POA: Diagnosis not present

## 2021-10-02 DIAGNOSIS — K219 Gastro-esophageal reflux disease without esophagitis: Secondary | ICD-10-CM | POA: Diagnosis not present

## 2021-10-02 DIAGNOSIS — E785 Hyperlipidemia, unspecified: Secondary | ICD-10-CM | POA: Diagnosis not present

## 2021-10-02 DIAGNOSIS — Z8616 Personal history of COVID-19: Secondary | ICD-10-CM | POA: Diagnosis not present

## 2021-10-02 DIAGNOSIS — K2980 Duodenitis without bleeding: Secondary | ICD-10-CM | POA: Diagnosis not present

## 2021-10-02 DIAGNOSIS — F10188 Alcohol abuse with other alcohol-induced disorder: Secondary | ICD-10-CM | POA: Diagnosis not present

## 2021-10-02 DIAGNOSIS — D689 Coagulation defect, unspecified: Secondary | ICD-10-CM | POA: Diagnosis not present

## 2021-10-02 DIAGNOSIS — R69 Illness, unspecified: Secondary | ICD-10-CM | POA: Diagnosis not present

## 2021-10-03 DIAGNOSIS — F10188 Alcohol abuse with other alcohol-induced disorder: Secondary | ICD-10-CM | POA: Diagnosis not present

## 2021-10-03 DIAGNOSIS — D649 Anemia, unspecified: Secondary | ICD-10-CM | POA: Diagnosis not present

## 2021-10-03 DIAGNOSIS — E785 Hyperlipidemia, unspecified: Secondary | ICD-10-CM | POA: Diagnosis not present

## 2021-10-03 DIAGNOSIS — K7010 Alcoholic hepatitis without ascites: Secondary | ICD-10-CM | POA: Diagnosis not present

## 2021-10-03 DIAGNOSIS — R69 Illness, unspecified: Secondary | ICD-10-CM | POA: Diagnosis not present

## 2021-10-03 DIAGNOSIS — K319 Disease of stomach and duodenum, unspecified: Secondary | ICD-10-CM | POA: Diagnosis not present

## 2021-10-03 DIAGNOSIS — K219 Gastro-esophageal reflux disease without esophagitis: Secondary | ICD-10-CM | POA: Diagnosis not present

## 2021-10-03 DIAGNOSIS — D689 Coagulation defect, unspecified: Secondary | ICD-10-CM | POA: Diagnosis not present

## 2021-10-03 DIAGNOSIS — K767 Hepatorenal syndrome: Secondary | ICD-10-CM | POA: Diagnosis not present

## 2021-10-03 DIAGNOSIS — Z8616 Personal history of COVID-19: Secondary | ICD-10-CM | POA: Diagnosis not present

## 2021-10-03 DIAGNOSIS — K2980 Duodenitis without bleeding: Secondary | ICD-10-CM | POA: Diagnosis not present

## 2021-10-04 DIAGNOSIS — R69 Illness, unspecified: Secondary | ICD-10-CM | POA: Diagnosis not present

## 2021-10-04 DIAGNOSIS — K2980 Duodenitis without bleeding: Secondary | ICD-10-CM | POA: Diagnosis not present

## 2021-10-04 DIAGNOSIS — Z8616 Personal history of COVID-19: Secondary | ICD-10-CM | POA: Diagnosis not present

## 2021-10-04 DIAGNOSIS — E785 Hyperlipidemia, unspecified: Secondary | ICD-10-CM | POA: Diagnosis not present

## 2021-10-04 DIAGNOSIS — K767 Hepatorenal syndrome: Secondary | ICD-10-CM | POA: Diagnosis not present

## 2021-10-04 DIAGNOSIS — K219 Gastro-esophageal reflux disease without esophagitis: Secondary | ICD-10-CM | POA: Diagnosis not present

## 2021-10-04 DIAGNOSIS — K7010 Alcoholic hepatitis without ascites: Secondary | ICD-10-CM | POA: Diagnosis not present

## 2021-10-04 DIAGNOSIS — D689 Coagulation defect, unspecified: Secondary | ICD-10-CM | POA: Diagnosis not present

## 2021-10-04 DIAGNOSIS — K319 Disease of stomach and duodenum, unspecified: Secondary | ICD-10-CM | POA: Diagnosis not present

## 2021-10-04 DIAGNOSIS — D649 Anemia, unspecified: Secondary | ICD-10-CM | POA: Diagnosis not present

## 2021-10-04 DIAGNOSIS — F10188 Alcohol abuse with other alcohol-induced disorder: Secondary | ICD-10-CM | POA: Diagnosis not present

## 2021-10-05 DIAGNOSIS — K319 Disease of stomach and duodenum, unspecified: Secondary | ICD-10-CM | POA: Diagnosis not present

## 2021-10-05 DIAGNOSIS — D689 Coagulation defect, unspecified: Secondary | ICD-10-CM | POA: Diagnosis not present

## 2021-10-05 DIAGNOSIS — R69 Illness, unspecified: Secondary | ICD-10-CM | POA: Diagnosis not present

## 2021-10-05 DIAGNOSIS — F10188 Alcohol abuse with other alcohol-induced disorder: Secondary | ICD-10-CM | POA: Diagnosis not present

## 2021-10-05 DIAGNOSIS — K767 Hepatorenal syndrome: Secondary | ICD-10-CM | POA: Diagnosis not present

## 2021-10-05 DIAGNOSIS — K7010 Alcoholic hepatitis without ascites: Secondary | ICD-10-CM | POA: Diagnosis not present

## 2021-10-05 DIAGNOSIS — D649 Anemia, unspecified: Secondary | ICD-10-CM | POA: Diagnosis not present

## 2021-10-05 DIAGNOSIS — K219 Gastro-esophageal reflux disease without esophagitis: Secondary | ICD-10-CM | POA: Diagnosis not present

## 2021-10-05 DIAGNOSIS — K2980 Duodenitis without bleeding: Secondary | ICD-10-CM | POA: Diagnosis not present

## 2021-10-05 DIAGNOSIS — E785 Hyperlipidemia, unspecified: Secondary | ICD-10-CM | POA: Diagnosis not present

## 2021-10-05 DIAGNOSIS — Z8616 Personal history of COVID-19: Secondary | ICD-10-CM | POA: Diagnosis not present

## 2021-10-06 DIAGNOSIS — F10188 Alcohol abuse with other alcohol-induced disorder: Secondary | ICD-10-CM | POA: Diagnosis not present

## 2021-10-06 DIAGNOSIS — K7010 Alcoholic hepatitis without ascites: Secondary | ICD-10-CM | POA: Diagnosis not present

## 2021-10-06 DIAGNOSIS — D649 Anemia, unspecified: Secondary | ICD-10-CM | POA: Diagnosis not present

## 2021-10-06 DIAGNOSIS — K767 Hepatorenal syndrome: Secondary | ICD-10-CM | POA: Diagnosis not present

## 2021-10-06 DIAGNOSIS — K2980 Duodenitis without bleeding: Secondary | ICD-10-CM | POA: Diagnosis not present

## 2021-10-06 DIAGNOSIS — E785 Hyperlipidemia, unspecified: Secondary | ICD-10-CM | POA: Diagnosis not present

## 2021-10-06 DIAGNOSIS — Z8616 Personal history of COVID-19: Secondary | ICD-10-CM | POA: Diagnosis not present

## 2021-10-06 DIAGNOSIS — D689 Coagulation defect, unspecified: Secondary | ICD-10-CM | POA: Diagnosis not present

## 2021-10-06 DIAGNOSIS — K319 Disease of stomach and duodenum, unspecified: Secondary | ICD-10-CM | POA: Diagnosis not present

## 2021-10-06 DIAGNOSIS — K219 Gastro-esophageal reflux disease without esophagitis: Secondary | ICD-10-CM | POA: Diagnosis not present

## 2021-10-06 DIAGNOSIS — R69 Illness, unspecified: Secondary | ICD-10-CM | POA: Diagnosis not present

## 2021-10-07 DIAGNOSIS — K319 Disease of stomach and duodenum, unspecified: Secondary | ICD-10-CM | POA: Diagnosis not present

## 2021-10-07 DIAGNOSIS — K2980 Duodenitis without bleeding: Secondary | ICD-10-CM | POA: Diagnosis not present

## 2021-10-07 DIAGNOSIS — D689 Coagulation defect, unspecified: Secondary | ICD-10-CM | POA: Diagnosis not present

## 2021-10-07 DIAGNOSIS — D649 Anemia, unspecified: Secondary | ICD-10-CM | POA: Diagnosis not present

## 2021-10-07 DIAGNOSIS — R69 Illness, unspecified: Secondary | ICD-10-CM | POA: Diagnosis not present

## 2021-10-07 DIAGNOSIS — K219 Gastro-esophageal reflux disease without esophagitis: Secondary | ICD-10-CM | POA: Diagnosis not present

## 2021-10-07 DIAGNOSIS — Z8616 Personal history of COVID-19: Secondary | ICD-10-CM | POA: Diagnosis not present

## 2021-10-07 DIAGNOSIS — K767 Hepatorenal syndrome: Secondary | ICD-10-CM | POA: Diagnosis not present

## 2021-10-07 DIAGNOSIS — F10188 Alcohol abuse with other alcohol-induced disorder: Secondary | ICD-10-CM | POA: Diagnosis not present

## 2021-10-07 DIAGNOSIS — K7010 Alcoholic hepatitis without ascites: Secondary | ICD-10-CM | POA: Diagnosis not present

## 2021-10-07 DIAGNOSIS — E785 Hyperlipidemia, unspecified: Secondary | ICD-10-CM | POA: Diagnosis not present

## 2021-10-08 DIAGNOSIS — K7010 Alcoholic hepatitis without ascites: Secondary | ICD-10-CM | POA: Diagnosis not present

## 2021-10-08 DIAGNOSIS — K2980 Duodenitis without bleeding: Secondary | ICD-10-CM | POA: Diagnosis not present

## 2021-10-08 DIAGNOSIS — K219 Gastro-esophageal reflux disease without esophagitis: Secondary | ICD-10-CM | POA: Diagnosis not present

## 2021-10-08 DIAGNOSIS — K319 Disease of stomach and duodenum, unspecified: Secondary | ICD-10-CM | POA: Diagnosis not present

## 2021-10-08 DIAGNOSIS — R69 Illness, unspecified: Secondary | ICD-10-CM | POA: Diagnosis not present

## 2021-10-08 DIAGNOSIS — D649 Anemia, unspecified: Secondary | ICD-10-CM | POA: Diagnosis not present

## 2021-10-08 DIAGNOSIS — E785 Hyperlipidemia, unspecified: Secondary | ICD-10-CM | POA: Diagnosis not present

## 2021-10-08 DIAGNOSIS — F10188 Alcohol abuse with other alcohol-induced disorder: Secondary | ICD-10-CM | POA: Diagnosis not present

## 2021-10-08 DIAGNOSIS — K767 Hepatorenal syndrome: Secondary | ICD-10-CM | POA: Diagnosis not present

## 2021-10-08 DIAGNOSIS — Z8616 Personal history of COVID-19: Secondary | ICD-10-CM | POA: Diagnosis not present

## 2021-10-08 DIAGNOSIS — D689 Coagulation defect, unspecified: Secondary | ICD-10-CM | POA: Diagnosis not present

## 2021-10-09 DIAGNOSIS — K319 Disease of stomach and duodenum, unspecified: Secondary | ICD-10-CM | POA: Diagnosis not present

## 2021-10-09 DIAGNOSIS — K219 Gastro-esophageal reflux disease without esophagitis: Secondary | ICD-10-CM | POA: Diagnosis not present

## 2021-10-09 DIAGNOSIS — Z8616 Personal history of COVID-19: Secondary | ICD-10-CM | POA: Diagnosis not present

## 2021-10-09 DIAGNOSIS — K767 Hepatorenal syndrome: Secondary | ICD-10-CM | POA: Diagnosis not present

## 2021-10-09 DIAGNOSIS — D649 Anemia, unspecified: Secondary | ICD-10-CM | POA: Diagnosis not present

## 2021-10-09 DIAGNOSIS — K2980 Duodenitis without bleeding: Secondary | ICD-10-CM | POA: Diagnosis not present

## 2021-10-09 DIAGNOSIS — F10188 Alcohol abuse with other alcohol-induced disorder: Secondary | ICD-10-CM | POA: Diagnosis not present

## 2021-10-09 DIAGNOSIS — D689 Coagulation defect, unspecified: Secondary | ICD-10-CM | POA: Diagnosis not present

## 2021-10-09 DIAGNOSIS — K7010 Alcoholic hepatitis without ascites: Secondary | ICD-10-CM | POA: Diagnosis not present

## 2021-10-09 DIAGNOSIS — R69 Illness, unspecified: Secondary | ICD-10-CM | POA: Diagnosis not present

## 2021-10-09 DIAGNOSIS — E785 Hyperlipidemia, unspecified: Secondary | ICD-10-CM | POA: Diagnosis not present

## 2021-10-10 DIAGNOSIS — K2980 Duodenitis without bleeding: Secondary | ICD-10-CM | POA: Diagnosis not present

## 2021-10-10 DIAGNOSIS — D689 Coagulation defect, unspecified: Secondary | ICD-10-CM | POA: Diagnosis not present

## 2021-10-10 DIAGNOSIS — K319 Disease of stomach and duodenum, unspecified: Secondary | ICD-10-CM | POA: Diagnosis not present

## 2021-10-10 DIAGNOSIS — D649 Anemia, unspecified: Secondary | ICD-10-CM | POA: Diagnosis not present

## 2021-10-10 DIAGNOSIS — E785 Hyperlipidemia, unspecified: Secondary | ICD-10-CM | POA: Diagnosis not present

## 2021-10-10 DIAGNOSIS — F10188 Alcohol abuse with other alcohol-induced disorder: Secondary | ICD-10-CM | POA: Diagnosis not present

## 2021-10-10 DIAGNOSIS — R69 Illness, unspecified: Secondary | ICD-10-CM | POA: Diagnosis not present

## 2021-10-10 DIAGNOSIS — K767 Hepatorenal syndrome: Secondary | ICD-10-CM | POA: Diagnosis not present

## 2021-10-10 DIAGNOSIS — K219 Gastro-esophageal reflux disease without esophagitis: Secondary | ICD-10-CM | POA: Diagnosis not present

## 2021-10-10 DIAGNOSIS — Z8616 Personal history of COVID-19: Secondary | ICD-10-CM | POA: Diagnosis not present

## 2021-10-10 DIAGNOSIS — K7010 Alcoholic hepatitis without ascites: Secondary | ICD-10-CM | POA: Diagnosis not present

## 2021-10-11 DIAGNOSIS — D649 Anemia, unspecified: Secondary | ICD-10-CM | POA: Diagnosis not present

## 2021-10-11 DIAGNOSIS — Z8616 Personal history of COVID-19: Secondary | ICD-10-CM | POA: Diagnosis not present

## 2021-10-11 DIAGNOSIS — E785 Hyperlipidemia, unspecified: Secondary | ICD-10-CM | POA: Diagnosis not present

## 2021-10-11 DIAGNOSIS — K7010 Alcoholic hepatitis without ascites: Secondary | ICD-10-CM | POA: Diagnosis not present

## 2021-10-11 DIAGNOSIS — K319 Disease of stomach and duodenum, unspecified: Secondary | ICD-10-CM | POA: Diagnosis not present

## 2021-10-11 DIAGNOSIS — R69 Illness, unspecified: Secondary | ICD-10-CM | POA: Diagnosis not present

## 2021-10-11 DIAGNOSIS — K767 Hepatorenal syndrome: Secondary | ICD-10-CM | POA: Diagnosis not present

## 2021-10-11 DIAGNOSIS — D689 Coagulation defect, unspecified: Secondary | ICD-10-CM | POA: Diagnosis not present

## 2021-10-11 DIAGNOSIS — F10188 Alcohol abuse with other alcohol-induced disorder: Secondary | ICD-10-CM | POA: Diagnosis not present

## 2021-10-11 DIAGNOSIS — K2980 Duodenitis without bleeding: Secondary | ICD-10-CM | POA: Diagnosis not present

## 2021-10-11 DIAGNOSIS — K219 Gastro-esophageal reflux disease without esophagitis: Secondary | ICD-10-CM | POA: Diagnosis not present

## 2021-10-12 DIAGNOSIS — K219 Gastro-esophageal reflux disease without esophagitis: Secondary | ICD-10-CM | POA: Diagnosis not present

## 2021-10-12 DIAGNOSIS — K7010 Alcoholic hepatitis without ascites: Secondary | ICD-10-CM | POA: Diagnosis not present

## 2021-10-12 DIAGNOSIS — K767 Hepatorenal syndrome: Secondary | ICD-10-CM | POA: Diagnosis not present

## 2021-10-12 DIAGNOSIS — E785 Hyperlipidemia, unspecified: Secondary | ICD-10-CM | POA: Diagnosis not present

## 2021-10-12 DIAGNOSIS — R69 Illness, unspecified: Secondary | ICD-10-CM | POA: Diagnosis not present

## 2021-10-12 DIAGNOSIS — K319 Disease of stomach and duodenum, unspecified: Secondary | ICD-10-CM | POA: Diagnosis not present

## 2021-10-12 DIAGNOSIS — K2980 Duodenitis without bleeding: Secondary | ICD-10-CM | POA: Diagnosis not present

## 2021-10-12 DIAGNOSIS — Z8616 Personal history of COVID-19: Secondary | ICD-10-CM | POA: Diagnosis not present

## 2021-10-12 DIAGNOSIS — D689 Coagulation defect, unspecified: Secondary | ICD-10-CM | POA: Diagnosis not present

## 2021-10-12 DIAGNOSIS — D649 Anemia, unspecified: Secondary | ICD-10-CM | POA: Diagnosis not present

## 2021-10-12 DIAGNOSIS — F10188 Alcohol abuse with other alcohol-induced disorder: Secondary | ICD-10-CM | POA: Diagnosis not present

## 2021-10-13 DIAGNOSIS — D689 Coagulation defect, unspecified: Secondary | ICD-10-CM | POA: Diagnosis not present

## 2021-10-13 DIAGNOSIS — K2980 Duodenitis without bleeding: Secondary | ICD-10-CM | POA: Diagnosis not present

## 2021-10-13 DIAGNOSIS — Z8616 Personal history of COVID-19: Secondary | ICD-10-CM | POA: Diagnosis not present

## 2021-10-13 DIAGNOSIS — E785 Hyperlipidemia, unspecified: Secondary | ICD-10-CM | POA: Diagnosis not present

## 2021-10-13 DIAGNOSIS — F10188 Alcohol abuse with other alcohol-induced disorder: Secondary | ICD-10-CM | POA: Diagnosis not present

## 2021-10-13 DIAGNOSIS — K219 Gastro-esophageal reflux disease without esophagitis: Secondary | ICD-10-CM | POA: Diagnosis not present

## 2021-10-13 DIAGNOSIS — K319 Disease of stomach and duodenum, unspecified: Secondary | ICD-10-CM | POA: Diagnosis not present

## 2021-10-13 DIAGNOSIS — R69 Illness, unspecified: Secondary | ICD-10-CM | POA: Diagnosis not present

## 2021-10-13 DIAGNOSIS — K767 Hepatorenal syndrome: Secondary | ICD-10-CM | POA: Diagnosis not present

## 2021-10-13 DIAGNOSIS — K7010 Alcoholic hepatitis without ascites: Secondary | ICD-10-CM | POA: Diagnosis not present

## 2021-10-13 DIAGNOSIS — D649 Anemia, unspecified: Secondary | ICD-10-CM | POA: Diagnosis not present

## 2021-10-14 DIAGNOSIS — Z8616 Personal history of COVID-19: Secondary | ICD-10-CM | POA: Diagnosis not present

## 2021-10-14 DIAGNOSIS — E785 Hyperlipidemia, unspecified: Secondary | ICD-10-CM | POA: Diagnosis not present

## 2021-10-14 DIAGNOSIS — R69 Illness, unspecified: Secondary | ICD-10-CM | POA: Diagnosis not present

## 2021-10-14 DIAGNOSIS — K2980 Duodenitis without bleeding: Secondary | ICD-10-CM | POA: Diagnosis not present

## 2021-10-14 DIAGNOSIS — D649 Anemia, unspecified: Secondary | ICD-10-CM | POA: Diagnosis not present

## 2021-10-14 DIAGNOSIS — K219 Gastro-esophageal reflux disease without esophagitis: Secondary | ICD-10-CM | POA: Diagnosis not present

## 2021-10-14 DIAGNOSIS — K7010 Alcoholic hepatitis without ascites: Secondary | ICD-10-CM | POA: Diagnosis not present

## 2021-10-14 DIAGNOSIS — D689 Coagulation defect, unspecified: Secondary | ICD-10-CM | POA: Diagnosis not present

## 2021-10-14 DIAGNOSIS — K767 Hepatorenal syndrome: Secondary | ICD-10-CM | POA: Diagnosis not present

## 2021-10-14 DIAGNOSIS — K319 Disease of stomach and duodenum, unspecified: Secondary | ICD-10-CM | POA: Diagnosis not present

## 2021-10-14 DIAGNOSIS — F10188 Alcohol abuse with other alcohol-induced disorder: Secondary | ICD-10-CM | POA: Diagnosis not present

## 2021-10-15 DIAGNOSIS — Z8616 Personal history of COVID-19: Secondary | ICD-10-CM | POA: Diagnosis not present

## 2021-10-15 DIAGNOSIS — K2980 Duodenitis without bleeding: Secondary | ICD-10-CM | POA: Diagnosis not present

## 2021-10-15 DIAGNOSIS — D689 Coagulation defect, unspecified: Secondary | ICD-10-CM | POA: Diagnosis not present

## 2021-10-15 DIAGNOSIS — K319 Disease of stomach and duodenum, unspecified: Secondary | ICD-10-CM | POA: Diagnosis not present

## 2021-10-15 DIAGNOSIS — K219 Gastro-esophageal reflux disease without esophagitis: Secondary | ICD-10-CM | POA: Diagnosis not present

## 2021-10-15 DIAGNOSIS — K767 Hepatorenal syndrome: Secondary | ICD-10-CM | POA: Diagnosis not present

## 2021-10-15 DIAGNOSIS — F10188 Alcohol abuse with other alcohol-induced disorder: Secondary | ICD-10-CM | POA: Diagnosis not present

## 2021-10-15 DIAGNOSIS — E785 Hyperlipidemia, unspecified: Secondary | ICD-10-CM | POA: Diagnosis not present

## 2021-10-15 DIAGNOSIS — K7010 Alcoholic hepatitis without ascites: Secondary | ICD-10-CM | POA: Diagnosis not present

## 2021-10-15 DIAGNOSIS — R69 Illness, unspecified: Secondary | ICD-10-CM | POA: Diagnosis not present

## 2021-10-15 DIAGNOSIS — D649 Anemia, unspecified: Secondary | ICD-10-CM | POA: Diagnosis not present

## 2021-10-16 DIAGNOSIS — E785 Hyperlipidemia, unspecified: Secondary | ICD-10-CM | POA: Diagnosis not present

## 2021-10-16 DIAGNOSIS — K7010 Alcoholic hepatitis without ascites: Secondary | ICD-10-CM | POA: Diagnosis not present

## 2021-10-16 DIAGNOSIS — Z8616 Personal history of COVID-19: Secondary | ICD-10-CM | POA: Diagnosis not present

## 2021-10-16 DIAGNOSIS — K219 Gastro-esophageal reflux disease without esophagitis: Secondary | ICD-10-CM | POA: Diagnosis not present

## 2021-10-16 DIAGNOSIS — D689 Coagulation defect, unspecified: Secondary | ICD-10-CM | POA: Diagnosis not present

## 2021-10-16 DIAGNOSIS — K319 Disease of stomach and duodenum, unspecified: Secondary | ICD-10-CM | POA: Diagnosis not present

## 2021-10-16 DIAGNOSIS — K2980 Duodenitis without bleeding: Secondary | ICD-10-CM | POA: Diagnosis not present

## 2021-10-16 DIAGNOSIS — K767 Hepatorenal syndrome: Secondary | ICD-10-CM | POA: Diagnosis not present

## 2021-10-16 DIAGNOSIS — D649 Anemia, unspecified: Secondary | ICD-10-CM | POA: Diagnosis not present

## 2021-10-16 DIAGNOSIS — R69 Illness, unspecified: Secondary | ICD-10-CM | POA: Diagnosis not present

## 2021-10-16 DIAGNOSIS — F10188 Alcohol abuse with other alcohol-induced disorder: Secondary | ICD-10-CM | POA: Diagnosis not present

## 2021-10-17 DIAGNOSIS — K2980 Duodenitis without bleeding: Secondary | ICD-10-CM | POA: Diagnosis not present

## 2021-10-17 DIAGNOSIS — R69 Illness, unspecified: Secondary | ICD-10-CM | POA: Diagnosis not present

## 2021-10-17 DIAGNOSIS — D649 Anemia, unspecified: Secondary | ICD-10-CM | POA: Diagnosis not present

## 2021-10-17 DIAGNOSIS — K219 Gastro-esophageal reflux disease without esophagitis: Secondary | ICD-10-CM | POA: Diagnosis not present

## 2021-10-17 DIAGNOSIS — F10188 Alcohol abuse with other alcohol-induced disorder: Secondary | ICD-10-CM | POA: Diagnosis not present

## 2021-10-17 DIAGNOSIS — K319 Disease of stomach and duodenum, unspecified: Secondary | ICD-10-CM | POA: Diagnosis not present

## 2021-10-17 DIAGNOSIS — D689 Coagulation defect, unspecified: Secondary | ICD-10-CM | POA: Diagnosis not present

## 2021-10-17 DIAGNOSIS — K7010 Alcoholic hepatitis without ascites: Secondary | ICD-10-CM | POA: Diagnosis not present

## 2021-10-17 DIAGNOSIS — E785 Hyperlipidemia, unspecified: Secondary | ICD-10-CM | POA: Diagnosis not present

## 2021-10-17 DIAGNOSIS — K767 Hepatorenal syndrome: Secondary | ICD-10-CM | POA: Diagnosis not present

## 2021-10-17 DIAGNOSIS — Z8616 Personal history of COVID-19: Secondary | ICD-10-CM | POA: Diagnosis not present

## 2021-10-18 DIAGNOSIS — F10188 Alcohol abuse with other alcohol-induced disorder: Secondary | ICD-10-CM | POA: Diagnosis not present

## 2021-10-18 DIAGNOSIS — K319 Disease of stomach and duodenum, unspecified: Secondary | ICD-10-CM | POA: Diagnosis not present

## 2021-10-18 DIAGNOSIS — K219 Gastro-esophageal reflux disease without esophagitis: Secondary | ICD-10-CM | POA: Diagnosis not present

## 2021-10-18 DIAGNOSIS — R69 Illness, unspecified: Secondary | ICD-10-CM | POA: Diagnosis not present

## 2021-10-18 DIAGNOSIS — K7010 Alcoholic hepatitis without ascites: Secondary | ICD-10-CM | POA: Diagnosis not present

## 2021-10-18 DIAGNOSIS — K767 Hepatorenal syndrome: Secondary | ICD-10-CM | POA: Diagnosis not present

## 2021-10-18 DIAGNOSIS — E785 Hyperlipidemia, unspecified: Secondary | ICD-10-CM | POA: Diagnosis not present

## 2021-10-18 DIAGNOSIS — D689 Coagulation defect, unspecified: Secondary | ICD-10-CM | POA: Diagnosis not present

## 2021-10-18 DIAGNOSIS — Z8616 Personal history of COVID-19: Secondary | ICD-10-CM | POA: Diagnosis not present

## 2021-10-18 DIAGNOSIS — K2980 Duodenitis without bleeding: Secondary | ICD-10-CM | POA: Diagnosis not present

## 2021-10-18 DIAGNOSIS — D649 Anemia, unspecified: Secondary | ICD-10-CM | POA: Diagnosis not present

## 2021-10-19 DIAGNOSIS — R69 Illness, unspecified: Secondary | ICD-10-CM | POA: Diagnosis not present

## 2021-10-19 DIAGNOSIS — K219 Gastro-esophageal reflux disease without esophagitis: Secondary | ICD-10-CM | POA: Diagnosis not present

## 2021-10-19 DIAGNOSIS — K319 Disease of stomach and duodenum, unspecified: Secondary | ICD-10-CM | POA: Diagnosis not present

## 2021-10-19 DIAGNOSIS — F10188 Alcohol abuse with other alcohol-induced disorder: Secondary | ICD-10-CM | POA: Diagnosis not present

## 2021-10-19 DIAGNOSIS — E785 Hyperlipidemia, unspecified: Secondary | ICD-10-CM | POA: Diagnosis not present

## 2021-10-19 DIAGNOSIS — D689 Coagulation defect, unspecified: Secondary | ICD-10-CM | POA: Diagnosis not present

## 2021-10-19 DIAGNOSIS — K7010 Alcoholic hepatitis without ascites: Secondary | ICD-10-CM | POA: Diagnosis not present

## 2021-10-19 DIAGNOSIS — K767 Hepatorenal syndrome: Secondary | ICD-10-CM | POA: Diagnosis not present

## 2021-10-19 DIAGNOSIS — Z8616 Personal history of COVID-19: Secondary | ICD-10-CM | POA: Diagnosis not present

## 2021-10-19 DIAGNOSIS — D649 Anemia, unspecified: Secondary | ICD-10-CM | POA: Diagnosis not present

## 2021-10-19 DIAGNOSIS — K2980 Duodenitis without bleeding: Secondary | ICD-10-CM | POA: Diagnosis not present

## 2021-10-20 DIAGNOSIS — K2980 Duodenitis without bleeding: Secondary | ICD-10-CM | POA: Diagnosis not present

## 2021-10-20 DIAGNOSIS — K7010 Alcoholic hepatitis without ascites: Secondary | ICD-10-CM | POA: Diagnosis not present

## 2021-10-20 DIAGNOSIS — D649 Anemia, unspecified: Secondary | ICD-10-CM | POA: Diagnosis not present

## 2021-10-20 DIAGNOSIS — K219 Gastro-esophageal reflux disease without esophagitis: Secondary | ICD-10-CM | POA: Diagnosis not present

## 2021-10-20 DIAGNOSIS — Z8616 Personal history of COVID-19: Secondary | ICD-10-CM | POA: Diagnosis not present

## 2021-10-20 DIAGNOSIS — R69 Illness, unspecified: Secondary | ICD-10-CM | POA: Diagnosis not present

## 2021-10-20 DIAGNOSIS — E785 Hyperlipidemia, unspecified: Secondary | ICD-10-CM | POA: Diagnosis not present

## 2021-10-20 DIAGNOSIS — D689 Coagulation defect, unspecified: Secondary | ICD-10-CM | POA: Diagnosis not present

## 2021-10-20 DIAGNOSIS — K767 Hepatorenal syndrome: Secondary | ICD-10-CM | POA: Diagnosis not present

## 2021-10-20 DIAGNOSIS — K319 Disease of stomach and duodenum, unspecified: Secondary | ICD-10-CM | POA: Diagnosis not present

## 2021-10-20 DIAGNOSIS — F10188 Alcohol abuse with other alcohol-induced disorder: Secondary | ICD-10-CM | POA: Diagnosis not present

## 2021-10-21 DIAGNOSIS — Z8616 Personal history of COVID-19: Secondary | ICD-10-CM | POA: Diagnosis not present

## 2021-10-21 DIAGNOSIS — F10188 Alcohol abuse with other alcohol-induced disorder: Secondary | ICD-10-CM | POA: Diagnosis not present

## 2021-10-21 DIAGNOSIS — K767 Hepatorenal syndrome: Secondary | ICD-10-CM | POA: Diagnosis not present

## 2021-10-21 DIAGNOSIS — D689 Coagulation defect, unspecified: Secondary | ICD-10-CM | POA: Diagnosis not present

## 2021-10-21 DIAGNOSIS — E785 Hyperlipidemia, unspecified: Secondary | ICD-10-CM | POA: Diagnosis not present

## 2021-10-21 DIAGNOSIS — R69 Illness, unspecified: Secondary | ICD-10-CM | POA: Diagnosis not present

## 2021-10-21 DIAGNOSIS — K219 Gastro-esophageal reflux disease without esophagitis: Secondary | ICD-10-CM | POA: Diagnosis not present

## 2021-10-21 DIAGNOSIS — D649 Anemia, unspecified: Secondary | ICD-10-CM | POA: Diagnosis not present

## 2021-10-21 DIAGNOSIS — K319 Disease of stomach and duodenum, unspecified: Secondary | ICD-10-CM | POA: Diagnosis not present

## 2021-10-21 DIAGNOSIS — K2980 Duodenitis without bleeding: Secondary | ICD-10-CM | POA: Diagnosis not present

## 2021-10-21 DIAGNOSIS — K7010 Alcoholic hepatitis without ascites: Secondary | ICD-10-CM | POA: Diagnosis not present

## 2021-10-22 DIAGNOSIS — K767 Hepatorenal syndrome: Secondary | ICD-10-CM | POA: Diagnosis not present

## 2021-10-22 DIAGNOSIS — K2980 Duodenitis without bleeding: Secondary | ICD-10-CM | POA: Diagnosis not present

## 2021-10-22 DIAGNOSIS — Z8616 Personal history of COVID-19: Secondary | ICD-10-CM | POA: Diagnosis not present

## 2021-10-22 DIAGNOSIS — D689 Coagulation defect, unspecified: Secondary | ICD-10-CM | POA: Diagnosis not present

## 2021-10-22 DIAGNOSIS — D649 Anemia, unspecified: Secondary | ICD-10-CM | POA: Diagnosis not present

## 2021-10-22 DIAGNOSIS — K319 Disease of stomach and duodenum, unspecified: Secondary | ICD-10-CM | POA: Diagnosis not present

## 2021-10-22 DIAGNOSIS — K219 Gastro-esophageal reflux disease without esophagitis: Secondary | ICD-10-CM | POA: Diagnosis not present

## 2021-10-22 DIAGNOSIS — R69 Illness, unspecified: Secondary | ICD-10-CM | POA: Diagnosis not present

## 2021-10-22 DIAGNOSIS — F10188 Alcohol abuse with other alcohol-induced disorder: Secondary | ICD-10-CM | POA: Diagnosis not present

## 2021-10-22 DIAGNOSIS — E785 Hyperlipidemia, unspecified: Secondary | ICD-10-CM | POA: Diagnosis not present

## 2021-10-22 DIAGNOSIS — K7010 Alcoholic hepatitis without ascites: Secondary | ICD-10-CM | POA: Diagnosis not present

## 2021-10-23 DIAGNOSIS — F10188 Alcohol abuse with other alcohol-induced disorder: Secondary | ICD-10-CM | POA: Diagnosis not present

## 2021-10-23 DIAGNOSIS — K7010 Alcoholic hepatitis without ascites: Secondary | ICD-10-CM | POA: Diagnosis not present

## 2021-10-23 DIAGNOSIS — D689 Coagulation defect, unspecified: Secondary | ICD-10-CM | POA: Diagnosis not present

## 2021-10-23 DIAGNOSIS — K219 Gastro-esophageal reflux disease without esophagitis: Secondary | ICD-10-CM | POA: Diagnosis not present

## 2021-10-23 DIAGNOSIS — E785 Hyperlipidemia, unspecified: Secondary | ICD-10-CM | POA: Diagnosis not present

## 2021-10-23 DIAGNOSIS — Z8616 Personal history of COVID-19: Secondary | ICD-10-CM | POA: Diagnosis not present

## 2021-10-23 DIAGNOSIS — K767 Hepatorenal syndrome: Secondary | ICD-10-CM | POA: Diagnosis not present

## 2021-10-23 DIAGNOSIS — K319 Disease of stomach and duodenum, unspecified: Secondary | ICD-10-CM | POA: Diagnosis not present

## 2021-10-23 DIAGNOSIS — R69 Illness, unspecified: Secondary | ICD-10-CM | POA: Diagnosis not present

## 2021-10-23 DIAGNOSIS — K2980 Duodenitis without bleeding: Secondary | ICD-10-CM | POA: Diagnosis not present

## 2021-10-23 DIAGNOSIS — D649 Anemia, unspecified: Secondary | ICD-10-CM | POA: Diagnosis not present

## 2021-10-24 DIAGNOSIS — K319 Disease of stomach and duodenum, unspecified: Secondary | ICD-10-CM | POA: Diagnosis not present

## 2021-10-24 DIAGNOSIS — E785 Hyperlipidemia, unspecified: Secondary | ICD-10-CM | POA: Diagnosis not present

## 2021-10-24 DIAGNOSIS — K2980 Duodenitis without bleeding: Secondary | ICD-10-CM | POA: Diagnosis not present

## 2021-10-24 DIAGNOSIS — D649 Anemia, unspecified: Secondary | ICD-10-CM | POA: Diagnosis not present

## 2021-10-24 DIAGNOSIS — Z8616 Personal history of COVID-19: Secondary | ICD-10-CM | POA: Diagnosis not present

## 2021-10-24 DIAGNOSIS — F10188 Alcohol abuse with other alcohol-induced disorder: Secondary | ICD-10-CM | POA: Diagnosis not present

## 2021-10-24 DIAGNOSIS — K767 Hepatorenal syndrome: Secondary | ICD-10-CM | POA: Diagnosis not present

## 2021-10-24 DIAGNOSIS — K7010 Alcoholic hepatitis without ascites: Secondary | ICD-10-CM | POA: Diagnosis not present

## 2021-10-24 DIAGNOSIS — D689 Coagulation defect, unspecified: Secondary | ICD-10-CM | POA: Diagnosis not present

## 2021-10-24 DIAGNOSIS — K219 Gastro-esophageal reflux disease without esophagitis: Secondary | ICD-10-CM | POA: Diagnosis not present

## 2021-10-24 DIAGNOSIS — R69 Illness, unspecified: Secondary | ICD-10-CM | POA: Diagnosis not present

## 2021-10-25 DIAGNOSIS — D689 Coagulation defect, unspecified: Secondary | ICD-10-CM | POA: Diagnosis not present

## 2021-10-25 DIAGNOSIS — K7010 Alcoholic hepatitis without ascites: Secondary | ICD-10-CM | POA: Diagnosis not present

## 2021-10-25 DIAGNOSIS — K767 Hepatorenal syndrome: Secondary | ICD-10-CM | POA: Diagnosis not present

## 2021-10-25 DIAGNOSIS — Z8616 Personal history of COVID-19: Secondary | ICD-10-CM | POA: Diagnosis not present

## 2021-10-25 DIAGNOSIS — F10188 Alcohol abuse with other alcohol-induced disorder: Secondary | ICD-10-CM | POA: Diagnosis not present

## 2021-10-25 DIAGNOSIS — K319 Disease of stomach and duodenum, unspecified: Secondary | ICD-10-CM | POA: Diagnosis not present

## 2021-10-25 DIAGNOSIS — K219 Gastro-esophageal reflux disease without esophagitis: Secondary | ICD-10-CM | POA: Diagnosis not present

## 2021-10-25 DIAGNOSIS — R69 Illness, unspecified: Secondary | ICD-10-CM | POA: Diagnosis not present

## 2021-10-25 DIAGNOSIS — E785 Hyperlipidemia, unspecified: Secondary | ICD-10-CM | POA: Diagnosis not present

## 2021-10-25 DIAGNOSIS — D649 Anemia, unspecified: Secondary | ICD-10-CM | POA: Diagnosis not present

## 2021-10-25 DIAGNOSIS — K2980 Duodenitis without bleeding: Secondary | ICD-10-CM | POA: Diagnosis not present

## 2021-10-26 DIAGNOSIS — K319 Disease of stomach and duodenum, unspecified: Secondary | ICD-10-CM | POA: Diagnosis not present

## 2021-10-26 DIAGNOSIS — Z8616 Personal history of COVID-19: Secondary | ICD-10-CM | POA: Diagnosis not present

## 2021-10-26 DIAGNOSIS — D689 Coagulation defect, unspecified: Secondary | ICD-10-CM | POA: Diagnosis not present

## 2021-10-26 DIAGNOSIS — K7010 Alcoholic hepatitis without ascites: Secondary | ICD-10-CM | POA: Diagnosis not present

## 2021-10-26 DIAGNOSIS — K219 Gastro-esophageal reflux disease without esophagitis: Secondary | ICD-10-CM | POA: Diagnosis not present

## 2021-10-26 DIAGNOSIS — R69 Illness, unspecified: Secondary | ICD-10-CM | POA: Diagnosis not present

## 2021-10-26 DIAGNOSIS — E785 Hyperlipidemia, unspecified: Secondary | ICD-10-CM | POA: Diagnosis not present

## 2021-10-26 DIAGNOSIS — F10188 Alcohol abuse with other alcohol-induced disorder: Secondary | ICD-10-CM | POA: Diagnosis not present

## 2021-10-26 DIAGNOSIS — K2980 Duodenitis without bleeding: Secondary | ICD-10-CM | POA: Diagnosis not present

## 2021-10-26 DIAGNOSIS — K767 Hepatorenal syndrome: Secondary | ICD-10-CM | POA: Diagnosis not present

## 2021-10-26 DIAGNOSIS — D649 Anemia, unspecified: Secondary | ICD-10-CM | POA: Diagnosis not present

## 2021-10-27 DIAGNOSIS — Z8616 Personal history of COVID-19: Secondary | ICD-10-CM | POA: Diagnosis not present

## 2021-10-27 DIAGNOSIS — K7010 Alcoholic hepatitis without ascites: Secondary | ICD-10-CM | POA: Diagnosis not present

## 2021-10-27 DIAGNOSIS — K767 Hepatorenal syndrome: Secondary | ICD-10-CM | POA: Diagnosis not present

## 2021-10-27 DIAGNOSIS — D649 Anemia, unspecified: Secondary | ICD-10-CM | POA: Diagnosis not present

## 2021-10-27 DIAGNOSIS — K319 Disease of stomach and duodenum, unspecified: Secondary | ICD-10-CM | POA: Diagnosis not present

## 2021-10-27 DIAGNOSIS — K2980 Duodenitis without bleeding: Secondary | ICD-10-CM | POA: Diagnosis not present

## 2021-10-27 DIAGNOSIS — R69 Illness, unspecified: Secondary | ICD-10-CM | POA: Diagnosis not present

## 2021-10-27 DIAGNOSIS — K219 Gastro-esophageal reflux disease without esophagitis: Secondary | ICD-10-CM | POA: Diagnosis not present

## 2021-10-27 DIAGNOSIS — D689 Coagulation defect, unspecified: Secondary | ICD-10-CM | POA: Diagnosis not present

## 2021-10-27 DIAGNOSIS — F10188 Alcohol abuse with other alcohol-induced disorder: Secondary | ICD-10-CM | POA: Diagnosis not present

## 2021-10-27 DIAGNOSIS — E785 Hyperlipidemia, unspecified: Secondary | ICD-10-CM | POA: Diagnosis not present

## 2021-10-28 DIAGNOSIS — D649 Anemia, unspecified: Secondary | ICD-10-CM | POA: Diagnosis not present

## 2021-10-28 DIAGNOSIS — F10188 Alcohol abuse with other alcohol-induced disorder: Secondary | ICD-10-CM | POA: Diagnosis not present

## 2021-10-28 DIAGNOSIS — K2980 Duodenitis without bleeding: Secondary | ICD-10-CM | POA: Diagnosis not present

## 2021-10-28 DIAGNOSIS — K319 Disease of stomach and duodenum, unspecified: Secondary | ICD-10-CM | POA: Diagnosis not present

## 2021-10-28 DIAGNOSIS — E785 Hyperlipidemia, unspecified: Secondary | ICD-10-CM | POA: Diagnosis not present

## 2021-10-28 DIAGNOSIS — K7010 Alcoholic hepatitis without ascites: Secondary | ICD-10-CM | POA: Diagnosis not present

## 2021-10-28 DIAGNOSIS — D689 Coagulation defect, unspecified: Secondary | ICD-10-CM | POA: Diagnosis not present

## 2021-10-28 DIAGNOSIS — K219 Gastro-esophageal reflux disease without esophagitis: Secondary | ICD-10-CM | POA: Diagnosis not present

## 2021-10-28 DIAGNOSIS — K767 Hepatorenal syndrome: Secondary | ICD-10-CM | POA: Diagnosis not present

## 2021-10-28 DIAGNOSIS — Z8616 Personal history of COVID-19: Secondary | ICD-10-CM | POA: Diagnosis not present

## 2021-10-28 DIAGNOSIS — R69 Illness, unspecified: Secondary | ICD-10-CM | POA: Diagnosis not present

## 2021-10-29 DIAGNOSIS — R69 Illness, unspecified: Secondary | ICD-10-CM | POA: Diagnosis not present

## 2021-10-29 DIAGNOSIS — F10188 Alcohol abuse with other alcohol-induced disorder: Secondary | ICD-10-CM | POA: Diagnosis not present

## 2021-10-29 DIAGNOSIS — K7010 Alcoholic hepatitis without ascites: Secondary | ICD-10-CM | POA: Diagnosis not present

## 2021-10-29 DIAGNOSIS — D689 Coagulation defect, unspecified: Secondary | ICD-10-CM | POA: Diagnosis not present

## 2021-10-29 DIAGNOSIS — K219 Gastro-esophageal reflux disease without esophagitis: Secondary | ICD-10-CM | POA: Diagnosis not present

## 2021-10-29 DIAGNOSIS — K767 Hepatorenal syndrome: Secondary | ICD-10-CM | POA: Diagnosis not present

## 2021-10-29 DIAGNOSIS — E785 Hyperlipidemia, unspecified: Secondary | ICD-10-CM | POA: Diagnosis not present

## 2021-10-29 DIAGNOSIS — D649 Anemia, unspecified: Secondary | ICD-10-CM | POA: Diagnosis not present

## 2021-10-29 DIAGNOSIS — K2980 Duodenitis without bleeding: Secondary | ICD-10-CM | POA: Diagnosis not present

## 2021-10-29 DIAGNOSIS — Z8616 Personal history of COVID-19: Secondary | ICD-10-CM | POA: Diagnosis not present

## 2021-10-29 DIAGNOSIS — K319 Disease of stomach and duodenum, unspecified: Secondary | ICD-10-CM | POA: Diagnosis not present

## 2021-10-30 DIAGNOSIS — K2980 Duodenitis without bleeding: Secondary | ICD-10-CM | POA: Diagnosis not present

## 2021-10-30 DIAGNOSIS — E785 Hyperlipidemia, unspecified: Secondary | ICD-10-CM | POA: Diagnosis not present

## 2021-10-30 DIAGNOSIS — F10188 Alcohol abuse with other alcohol-induced disorder: Secondary | ICD-10-CM | POA: Diagnosis not present

## 2021-10-30 DIAGNOSIS — D649 Anemia, unspecified: Secondary | ICD-10-CM | POA: Diagnosis not present

## 2021-10-30 DIAGNOSIS — D689 Coagulation defect, unspecified: Secondary | ICD-10-CM | POA: Diagnosis not present

## 2021-10-30 DIAGNOSIS — K319 Disease of stomach and duodenum, unspecified: Secondary | ICD-10-CM | POA: Diagnosis not present

## 2021-10-30 DIAGNOSIS — Z8616 Personal history of COVID-19: Secondary | ICD-10-CM | POA: Diagnosis not present

## 2021-10-30 DIAGNOSIS — K767 Hepatorenal syndrome: Secondary | ICD-10-CM | POA: Diagnosis not present

## 2021-10-30 DIAGNOSIS — K219 Gastro-esophageal reflux disease without esophagitis: Secondary | ICD-10-CM | POA: Diagnosis not present

## 2021-10-30 DIAGNOSIS — R69 Illness, unspecified: Secondary | ICD-10-CM | POA: Diagnosis not present

## 2021-10-30 DIAGNOSIS — K7010 Alcoholic hepatitis without ascites: Secondary | ICD-10-CM | POA: Diagnosis not present

## 2021-10-31 ENCOUNTER — Ambulatory Visit (INDEPENDENT_AMBULATORY_CARE_PROVIDER_SITE_OTHER): Payer: No Typology Code available for payment source | Admitting: Family Medicine

## 2021-10-31 VITALS — BP 102/60 | HR 78 | Temp 98.1°F | Ht 68.0 in | Wt 172.0 lb

## 2021-10-31 DIAGNOSIS — F10188 Alcohol abuse with other alcohol-induced disorder: Secondary | ICD-10-CM | POA: Diagnosis not present

## 2021-10-31 DIAGNOSIS — E785 Hyperlipidemia, unspecified: Secondary | ICD-10-CM | POA: Diagnosis not present

## 2021-10-31 DIAGNOSIS — R69 Illness, unspecified: Secondary | ICD-10-CM | POA: Diagnosis not present

## 2021-10-31 DIAGNOSIS — D689 Coagulation defect, unspecified: Secondary | ICD-10-CM | POA: Diagnosis not present

## 2021-10-31 DIAGNOSIS — K767 Hepatorenal syndrome: Secondary | ICD-10-CM | POA: Diagnosis not present

## 2021-10-31 DIAGNOSIS — Z8616 Personal history of COVID-19: Secondary | ICD-10-CM | POA: Diagnosis not present

## 2021-10-31 DIAGNOSIS — K7010 Alcoholic hepatitis without ascites: Secondary | ICD-10-CM | POA: Diagnosis not present

## 2021-10-31 DIAGNOSIS — K2980 Duodenitis without bleeding: Secondary | ICD-10-CM | POA: Diagnosis not present

## 2021-10-31 DIAGNOSIS — K7030 Alcoholic cirrhosis of liver without ascites: Secondary | ICD-10-CM

## 2021-10-31 DIAGNOSIS — K319 Disease of stomach and duodenum, unspecified: Secondary | ICD-10-CM | POA: Diagnosis not present

## 2021-10-31 DIAGNOSIS — K219 Gastro-esophageal reflux disease without esophagitis: Secondary | ICD-10-CM | POA: Diagnosis not present

## 2021-10-31 DIAGNOSIS — D649 Anemia, unspecified: Secondary | ICD-10-CM | POA: Diagnosis not present

## 2021-10-31 MED ORDER — HYDROXYZINE PAMOATE 25 MG PO CAPS: 25.0000 mg | ORAL_CAPSULE | Freq: Every evening | ORAL | 0 refills | Status: AC | PRN

## 2021-10-31 NOTE — Progress Notes (Signed)
Subjective:    Patient ID: Christopher Carrillo, male    DOB: 06-09-1970, 51 y.o.   MRN: [id number]  HPI In February, he was on hospice due to liver failure from alcoholic cirrhosis.  Patient had remain abstinent from alcohol for 4 months but had a relapse in May where he started drinking beer.  Around the same time his marriage dissolved.  He is separated from his wife and he states that there is no hope for reconciliation.  However the patient states that she was bringing alcohol home for him and that he asked her to stop which triggered her to leave him.  I apologized to the patient.  This seems like a very unhealthy relationship if this was in fact occurring.  He is not receiving any formal alcohol treatment program but he states that he stopped drinking 2 months ago.  However his color has worsened.  He is jaundiced today on exam.  There is scleral icterus.  He is extremely tearful and appears depressed.  However he refuses any medicine for depression as he experienced suicidal thoughts on Lexapro in the past.  He does report trouble sleeping.  He has been trying melatonin states that that is not helping. Past Medical History:  Diagnosis Date   Abdominal pain    Alcoholic hepatitis without ascites    Anemia    Anxiety    Arthritis    Elevated bilirubin    Essential hypertension    Psoriasis    Transaminitis    Past Surgical History:  Procedure Laterality Date   BIOPSY  07/20/2019   Procedure: BIOPSY;  Surgeon: Daneil Dolin, MD;  [facility];  Service: Endoscopy;;   BIOPSY  08/14/2019   Procedure: BIOPSY;  Surgeon: Thornton Park, MD;  Location: [place];  Service: Gastroenterology;;   COLONOSCOPY     COLONOSCOPY WITH PROPOFOL N/A 07/20/2019   Procedure: COLONOSCOPY WITH PROPOFOL;  Surgeon: Daneil Dolin, MD;  [facility];  Service: Endoscopy;  Laterality: N/A;  10:15am   COLONOSCOPY WITH PROPOFOL N/A 08/14/2019   Procedure: COLONOSCOPY WITH PROPOFOL;   Surgeon: Thornton Park, MD;  Location: [place];  Service: Gastroenterology;  Laterality: N/A;   IR PARACENTESIS  08/14/2019   IR PARACENTESIS  05/02/2021   POLYPECTOMY  07/20/2019   Procedure: POLYPECTOMY;  Surgeon: Daneil Dolin, MD;  [facility];  Service: Endoscopy;;   SUBMUCOSAL TATTOO INJECTION  08/14/2019   Procedure: SUBMUCOSAL TATTOO INJECTION;  Surgeon: Thornton Park, MD;  Location: [place];  Service: Gastroenterology;;   Current Outpatient Medications on File Prior to Visit  Medication Sig Dispense Refill   feeding supplement (ENSURE ENLIVE / ENSURE PLUS) LIQD Take 237 mLs by mouth 3 (three) times daily between meals. 177 mL 12   folic acid (FOLVITE) 1 MG tablet Take 1 tablet (1 mg total) by mouth daily. 30 tablet 3   lactulose (CHRONULAC) 10 GM/15ML solution Take 30 mLs (20 g total) by mouth daily. 473 mL 2   ondansetron (ZOFRAN) 4 MG tablet Take 1 tablet (4 mg total) by mouth every 6 (six) hours as needed for nausea. 20 tablet 0   pantoprazole (PROTONIX) 40 MG tablet Take 1 tablet (40 mg total) by mouth 2 (two) times daily before a meal. 60 tablet 2   potassium chloride SA (KLOR-CON M) 20 MEQ tablet TAKE 1 TABLET BY MOUTH EVERY DAY 30 tablet 0   predniSONE (DELTASONE) 5 MG tablet Take 40 mg daily, then decrease by  5 mg weekly. 150 tablet 3   rifaximin (XIFAXAN) 550 MG TABS tablet Take 1 tablet (550 mg total) by mouth 2 (two) times daily. 90 tablet 2   spironolactone (ALDACTONE) 25 MG tablet Take 1 tablet (25 mg total) by mouth daily. 30 tablet 5   thiamine 100 MG tablet Take 1 tablet (100 mg total) by mouth daily. 30 tablet 2   traMADol (ULTRAM) 50 MG tablet TAKE 1 TABLET(50 MG) BY MOUTH EVERY 6 HOURS AS NEEDED 30 tablet 0   No current facility-administered medications on file prior to visit.   Allergies  Allergen Reactions   Oxycodone Itching   Social History   Socioeconomic History   Marital status: Married    Spouse name: Not on file    Number of children: Not on file   Years of education: Not on file   Highest education level: Not on file  Occupational History   Not on file  Tobacco Use   Smoking status: Every Day    Packs/day: 0.33    Types: Cigarettes   Smokeless tobacco: Never  Vaping Use   Vaping Use: Never used  Substance and Sexual Activity   Alcohol use: Yes    Alcohol/week: 1.0 standard drink of alcohol    Types: 1 Cans of beer per week    Comment: occasionally   Drug use: No   Sexual activity: Yes  Other Topics Concern   Not on file  Social History Narrative   Not on file   Social Determinants of Health   Financial Resource Strain: Not on file  Food Insecurity: Not on file  Transportation Needs: Not on file  Physical Activity: Not on file  Stress: Not on file  Social Connections: Not on file  Intimate Partner Violence: Not on file     Review of Systems     Objective:   Physical Exam Vitals reviewed.  Constitutional:      General: He is not in acute distress.    Appearance: Normal appearance. He is normal weight. He is not ill-appearing, toxic-appearing or diaphoretic.  Eyes:     General: Scleral icterus present.  Cardiovascular:     Rate and Rhythm: Normal rate and regular rhythm.     Heart sounds: Normal heart sounds. No murmur heard.    No friction rub. No gallop.  Pulmonary:     Effort: Pulmonary effort is normal. No respiratory distress.     Breath sounds: Normal breath sounds. No stridor. No wheezing, rhonchi or rales.  Abdominal:     General: Abdomen is flat. Bowel sounds are normal. There is no distension.     Palpations: Abdomen is soft.     Tenderness: There is no abdominal tenderness. There is no guarding or rebound.  Musculoskeletal:     Right lower leg: Edema present.     Left lower leg: Edema present.  Skin:    Coloration: Skin is jaundiced. Skin is not pale.  Neurological:     General: No focal deficit present.     Mental Status: He is alert and oriented to  person, place, and time. Mental status is at baseline.  Psychiatric:        Attention and Perception: Attention normal.        Mood and Affect: Mood is depressed. Affect is tearful.        Speech: Speech normal.        Behavior: Behavior normal.        Thought Content: Thought content normal.  Cognition and Memory: Cognition and memory normal.           Assessment & Plan:  Alcoholic cirrhosis of liver without ascites ([place]) - Plan: CBC with Differential/Platelet, COMPLETE METABOLIC PANEL WITH GFR, PT with INR/Fingerstick, Protime-INR Patient seems very sad and depressed today however he declines any medication for depression.  He also declines any referral for counseling.  He has been sober now for 2 months.  I am concerned about his overall clinical appearance which has deteriorated since April.  I will check a CBC a CMP and PT and INR.  I have recommended that the patient increase his Lasix.  He states that he is taking 20 mg a day.  I recommended increasing that to 40 mg a day given the swelling in his legs.  I will check his potassium level to determine if he needs additional potassium.

## 2021-11-01 ENCOUNTER — Telehealth: Payer: Self-pay

## 2021-11-01 DIAGNOSIS — F10188 Alcohol abuse with other alcohol-induced disorder: Secondary | ICD-10-CM | POA: Diagnosis not present

## 2021-11-01 DIAGNOSIS — D689 Coagulation defect, unspecified: Secondary | ICD-10-CM | POA: Diagnosis not present

## 2021-11-01 DIAGNOSIS — K319 Disease of stomach and duodenum, unspecified: Secondary | ICD-10-CM | POA: Diagnosis not present

## 2021-11-01 DIAGNOSIS — E785 Hyperlipidemia, unspecified: Secondary | ICD-10-CM | POA: Diagnosis not present

## 2021-11-01 DIAGNOSIS — K219 Gastro-esophageal reflux disease without esophagitis: Secondary | ICD-10-CM | POA: Diagnosis not present

## 2021-11-01 DIAGNOSIS — D649 Anemia, unspecified: Secondary | ICD-10-CM | POA: Diagnosis not present

## 2021-11-01 DIAGNOSIS — R69 Illness, unspecified: Secondary | ICD-10-CM | POA: Diagnosis not present

## 2021-11-01 DIAGNOSIS — K767 Hepatorenal syndrome: Secondary | ICD-10-CM | POA: Diagnosis not present

## 2021-11-01 DIAGNOSIS — Z8616 Personal history of COVID-19: Secondary | ICD-10-CM | POA: Diagnosis not present

## 2021-11-01 DIAGNOSIS — K2980 Duodenitis without bleeding: Secondary | ICD-10-CM | POA: Diagnosis not present

## 2021-11-01 DIAGNOSIS — K7010 Alcoholic hepatitis without ascites: Secondary | ICD-10-CM | POA: Diagnosis not present

## 2021-11-01 LAB — CBC WITH DIFFERENTIAL/PLATELET
Absolute Monocytes: 938 cells/uL (ref 200–950)
Basophils Absolute: 60 cells/uL (ref 0–200)
Basophils Relative: 0.8 %
Eosinophils Absolute: 173 cells/uL (ref 15–500)
Eosinophils Relative: 2.3 %
HCT: 25.4 % — ABNORMAL LOW (ref 38.5–50.0)
Hemoglobin: 9.2 g/dL — ABNORMAL LOW (ref 13.2–17.1)
Lymphs Abs: 1740 cells/uL (ref 850–3900)
MCH: 35.2 pg — ABNORMAL HIGH (ref 27.0–33.0)
MCHC: 36.2 g/dL — ABNORMAL HIGH (ref 32.0–36.0)
MCV: 97.3 fL (ref 80.0–100.0)
MPV: 9.7 fL (ref 7.5–12.5)
Monocytes Relative: 12.5 %
Neutro Abs: 4590 cells/uL (ref 1500–7800)
Neutrophils Relative %: 61.2 %
Platelets: 150 10*3/uL (ref 140–400)
RBC: 2.61 10*6/uL — ABNORMAL LOW (ref 4.20–5.80)
RDW: 12.7 % (ref 11.0–15.0)
Total Lymphocyte: 23.2 %
WBC: 7.5 10*3/uL (ref 3.8–10.8)

## 2021-11-01 LAB — COMPLETE METABOLIC PANEL WITH GFR
AG Ratio: 1.3 (calc) (ref 1.0–2.5)
ALT: 16 U/L (ref 9–46)
AST: 30 U/L (ref 10–40)
Albumin: 3.3 g/dL — ABNORMAL LOW (ref 3.6–5.1)
Alkaline phosphatase (APISO): 125 U/L (ref 36–130)
BUN: 9 mg/dL (ref 7–25)
CO2: 21 mmol/L (ref 20–32)
Calcium: 8.8 mg/dL (ref 8.6–10.3)
Chloride: 105 mmol/L (ref 98–110)
Creat: 1.19 mg/dL (ref 0.60–1.29)
Globulin: 2.5 g/dL (calc) (ref 1.9–3.7)
Glucose, Bld: 108 mg/dL — ABNORMAL HIGH (ref 65–99)
Potassium: 3.9 mmol/L (ref 3.5–5.3)
Sodium: 137 mmol/L (ref 135–146)
Total Bilirubin: 5.1 mg/dL — ABNORMAL HIGH (ref 0.2–1.2)
Total Protein: 5.8 g/dL — ABNORMAL LOW (ref 6.1–8.1)
eGFR: 75 mL/min/{1.73_m2} (ref >=60)

## 2021-11-01 LAB — PROTIME-INR
INR: 1.4 — ABNORMAL HIGH
Prothrombin Time: 15 s — ABNORMAL HIGH (ref 9.0–11.5)

## 2021-11-01 NOTE — Telephone Encounter (Signed)
Pt called in stating that he wanted to let pcp know of the dosage he is taking of this med furosemide (LASIX) 20 MG. Pt states that he take this med once a day.    Cb#: [phone]

## 2021-11-02 DIAGNOSIS — K7010 Alcoholic hepatitis without ascites: Secondary | ICD-10-CM | POA: Diagnosis not present

## 2021-11-02 DIAGNOSIS — R69 Illness, unspecified: Secondary | ICD-10-CM | POA: Diagnosis not present

## 2021-11-02 DIAGNOSIS — E785 Hyperlipidemia, unspecified: Secondary | ICD-10-CM | POA: Diagnosis not present

## 2021-11-02 DIAGNOSIS — Z8616 Personal history of COVID-19: Secondary | ICD-10-CM | POA: Diagnosis not present

## 2021-11-02 DIAGNOSIS — K319 Disease of stomach and duodenum, unspecified: Secondary | ICD-10-CM | POA: Diagnosis not present

## 2021-11-02 DIAGNOSIS — K219 Gastro-esophageal reflux disease without esophagitis: Secondary | ICD-10-CM | POA: Diagnosis not present

## 2021-11-02 DIAGNOSIS — K2980 Duodenitis without bleeding: Secondary | ICD-10-CM | POA: Diagnosis not present

## 2021-11-02 DIAGNOSIS — F10188 Alcohol abuse with other alcohol-induced disorder: Secondary | ICD-10-CM | POA: Diagnosis not present

## 2021-11-02 DIAGNOSIS — D689 Coagulation defect, unspecified: Secondary | ICD-10-CM | POA: Diagnosis not present

## 2021-11-02 DIAGNOSIS — D649 Anemia, unspecified: Secondary | ICD-10-CM | POA: Diagnosis not present

## 2021-11-02 DIAGNOSIS — K767 Hepatorenal syndrome: Secondary | ICD-10-CM | POA: Diagnosis not present

## 2021-11-02 MED ORDER — FUROSEMIDE 40 MG PO TABS: 40.0000 mg | ORAL_TABLET | Freq: Every day | ORAL | 3 refills | Status: DC

## 2021-11-02 NOTE — Telephone Encounter (Signed)
Spoke w/pt, aware Rx has been sent to pharmacy per Dr. Dennard Schaumann, Increase lasix to 40 mg poqday due to swelling in legs

## 2021-11-03 DIAGNOSIS — K319 Disease of stomach and duodenum, unspecified: Secondary | ICD-10-CM | POA: Diagnosis not present

## 2021-11-03 DIAGNOSIS — K7010 Alcoholic hepatitis without ascites: Secondary | ICD-10-CM | POA: Diagnosis not present

## 2021-11-03 DIAGNOSIS — K2980 Duodenitis without bleeding: Secondary | ICD-10-CM | POA: Diagnosis not present

## 2021-11-03 DIAGNOSIS — E785 Hyperlipidemia, unspecified: Secondary | ICD-10-CM | POA: Diagnosis not present

## 2021-11-03 DIAGNOSIS — F10188 Alcohol abuse with other alcohol-induced disorder: Secondary | ICD-10-CM | POA: Diagnosis not present

## 2021-11-03 DIAGNOSIS — D689 Coagulation defect, unspecified: Secondary | ICD-10-CM | POA: Diagnosis not present

## 2021-11-03 DIAGNOSIS — R69 Illness, unspecified: Secondary | ICD-10-CM | POA: Diagnosis not present

## 2021-11-03 DIAGNOSIS — K219 Gastro-esophageal reflux disease without esophagitis: Secondary | ICD-10-CM | POA: Diagnosis not present

## 2021-11-03 DIAGNOSIS — D649 Anemia, unspecified: Secondary | ICD-10-CM | POA: Diagnosis not present

## 2021-11-03 DIAGNOSIS — K767 Hepatorenal syndrome: Secondary | ICD-10-CM | POA: Diagnosis not present

## 2021-11-03 DIAGNOSIS — Z8616 Personal history of COVID-19: Secondary | ICD-10-CM | POA: Diagnosis not present

## 2021-11-04 DIAGNOSIS — D649 Anemia, unspecified: Secondary | ICD-10-CM | POA: Diagnosis not present

## 2021-11-04 DIAGNOSIS — K219 Gastro-esophageal reflux disease without esophagitis: Secondary | ICD-10-CM | POA: Diagnosis not present

## 2021-11-04 DIAGNOSIS — E785 Hyperlipidemia, unspecified: Secondary | ICD-10-CM | POA: Diagnosis not present

## 2021-11-04 DIAGNOSIS — D689 Coagulation defect, unspecified: Secondary | ICD-10-CM | POA: Diagnosis not present

## 2021-11-04 DIAGNOSIS — R69 Illness, unspecified: Secondary | ICD-10-CM | POA: Diagnosis not present

## 2021-11-04 DIAGNOSIS — Z8616 Personal history of COVID-19: Secondary | ICD-10-CM | POA: Diagnosis not present

## 2021-11-04 DIAGNOSIS — F10188 Alcohol abuse with other alcohol-induced disorder: Secondary | ICD-10-CM | POA: Diagnosis not present

## 2021-11-04 DIAGNOSIS — K767 Hepatorenal syndrome: Secondary | ICD-10-CM | POA: Diagnosis not present

## 2021-11-04 DIAGNOSIS — K2980 Duodenitis without bleeding: Secondary | ICD-10-CM | POA: Diagnosis not present

## 2021-11-04 DIAGNOSIS — K7010 Alcoholic hepatitis without ascites: Secondary | ICD-10-CM | POA: Diagnosis not present

## 2021-11-04 DIAGNOSIS — K319 Disease of stomach and duodenum, unspecified: Secondary | ICD-10-CM | POA: Diagnosis not present

## 2021-11-05 DIAGNOSIS — K767 Hepatorenal syndrome: Secondary | ICD-10-CM | POA: Diagnosis not present

## 2021-11-05 DIAGNOSIS — D689 Coagulation defect, unspecified: Secondary | ICD-10-CM | POA: Diagnosis not present

## 2021-11-05 DIAGNOSIS — K219 Gastro-esophageal reflux disease without esophagitis: Secondary | ICD-10-CM | POA: Diagnosis not present

## 2021-11-05 DIAGNOSIS — F10188 Alcohol abuse with other alcohol-induced disorder: Secondary | ICD-10-CM | POA: Diagnosis not present

## 2021-11-05 DIAGNOSIS — K2980 Duodenitis without bleeding: Secondary | ICD-10-CM | POA: Diagnosis not present

## 2021-11-05 DIAGNOSIS — R69 Illness, unspecified: Secondary | ICD-10-CM | POA: Diagnosis not present

## 2021-11-05 DIAGNOSIS — E785 Hyperlipidemia, unspecified: Secondary | ICD-10-CM | POA: Diagnosis not present

## 2021-11-05 DIAGNOSIS — Z8616 Personal history of COVID-19: Secondary | ICD-10-CM | POA: Diagnosis not present

## 2021-11-05 DIAGNOSIS — K7010 Alcoholic hepatitis without ascites: Secondary | ICD-10-CM | POA: Diagnosis not present

## 2021-11-05 DIAGNOSIS — D649 Anemia, unspecified: Secondary | ICD-10-CM | POA: Diagnosis not present

## 2021-11-05 DIAGNOSIS — K319 Disease of stomach and duodenum, unspecified: Secondary | ICD-10-CM | POA: Diagnosis not present

## 2021-11-06 DIAGNOSIS — F10188 Alcohol abuse with other alcohol-induced disorder: Secondary | ICD-10-CM | POA: Diagnosis not present

## 2021-11-06 DIAGNOSIS — K7010 Alcoholic hepatitis without ascites: Secondary | ICD-10-CM | POA: Diagnosis not present

## 2021-11-06 DIAGNOSIS — Z8616 Personal history of COVID-19: Secondary | ICD-10-CM | POA: Diagnosis not present

## 2021-11-06 DIAGNOSIS — D689 Coagulation defect, unspecified: Secondary | ICD-10-CM | POA: Diagnosis not present

## 2021-11-06 DIAGNOSIS — E785 Hyperlipidemia, unspecified: Secondary | ICD-10-CM | POA: Diagnosis not present

## 2021-11-06 DIAGNOSIS — K319 Disease of stomach and duodenum, unspecified: Secondary | ICD-10-CM | POA: Diagnosis not present

## 2021-11-06 DIAGNOSIS — K219 Gastro-esophageal reflux disease without esophagitis: Secondary | ICD-10-CM | POA: Diagnosis not present

## 2021-11-06 DIAGNOSIS — R69 Illness, unspecified: Secondary | ICD-10-CM | POA: Diagnosis not present

## 2021-11-06 DIAGNOSIS — K2980 Duodenitis without bleeding: Secondary | ICD-10-CM | POA: Diagnosis not present

## 2021-11-06 DIAGNOSIS — D649 Anemia, unspecified: Secondary | ICD-10-CM | POA: Diagnosis not present

## 2021-11-06 DIAGNOSIS — K767 Hepatorenal syndrome: Secondary | ICD-10-CM | POA: Diagnosis not present

## 2021-11-07 DIAGNOSIS — K767 Hepatorenal syndrome: Secondary | ICD-10-CM | POA: Diagnosis not present

## 2021-11-07 DIAGNOSIS — K319 Disease of stomach and duodenum, unspecified: Secondary | ICD-10-CM | POA: Diagnosis not present

## 2021-11-07 DIAGNOSIS — R69 Illness, unspecified: Secondary | ICD-10-CM | POA: Diagnosis not present

## 2021-11-07 DIAGNOSIS — K219 Gastro-esophageal reflux disease without esophagitis: Secondary | ICD-10-CM | POA: Diagnosis not present

## 2021-11-07 DIAGNOSIS — K7010 Alcoholic hepatitis without ascites: Secondary | ICD-10-CM | POA: Diagnosis not present

## 2021-11-07 DIAGNOSIS — K2980 Duodenitis without bleeding: Secondary | ICD-10-CM | POA: Diagnosis not present

## 2021-11-07 DIAGNOSIS — F10188 Alcohol abuse with other alcohol-induced disorder: Secondary | ICD-10-CM | POA: Diagnosis not present

## 2021-11-07 DIAGNOSIS — D649 Anemia, unspecified: Secondary | ICD-10-CM | POA: Diagnosis not present

## 2021-11-07 DIAGNOSIS — D689 Coagulation defect, unspecified: Secondary | ICD-10-CM | POA: Diagnosis not present

## 2021-11-07 DIAGNOSIS — Z8616 Personal history of COVID-19: Secondary | ICD-10-CM | POA: Diagnosis not present

## 2021-11-07 DIAGNOSIS — E785 Hyperlipidemia, unspecified: Secondary | ICD-10-CM | POA: Diagnosis not present

## 2021-11-08 DIAGNOSIS — K767 Hepatorenal syndrome: Secondary | ICD-10-CM | POA: Diagnosis not present

## 2021-11-08 DIAGNOSIS — D689 Coagulation defect, unspecified: Secondary | ICD-10-CM | POA: Diagnosis not present

## 2021-11-08 DIAGNOSIS — D649 Anemia, unspecified: Secondary | ICD-10-CM | POA: Diagnosis not present

## 2021-11-08 DIAGNOSIS — Z8616 Personal history of COVID-19: Secondary | ICD-10-CM | POA: Diagnosis not present

## 2021-11-08 DIAGNOSIS — K2980 Duodenitis without bleeding: Secondary | ICD-10-CM | POA: Diagnosis not present

## 2021-11-08 DIAGNOSIS — F10188 Alcohol abuse with other alcohol-induced disorder: Secondary | ICD-10-CM | POA: Diagnosis not present

## 2021-11-08 DIAGNOSIS — R69 Illness, unspecified: Secondary | ICD-10-CM | POA: Diagnosis not present

## 2021-11-08 DIAGNOSIS — E785 Hyperlipidemia, unspecified: Secondary | ICD-10-CM | POA: Diagnosis not present

## 2021-11-08 DIAGNOSIS — K319 Disease of stomach and duodenum, unspecified: Secondary | ICD-10-CM | POA: Diagnosis not present

## 2021-11-08 DIAGNOSIS — K7010 Alcoholic hepatitis without ascites: Secondary | ICD-10-CM | POA: Diagnosis not present

## 2021-11-08 DIAGNOSIS — K219 Gastro-esophageal reflux disease without esophagitis: Secondary | ICD-10-CM | POA: Diagnosis not present

## 2021-11-09 DIAGNOSIS — E785 Hyperlipidemia, unspecified: Secondary | ICD-10-CM | POA: Diagnosis not present

## 2021-11-09 DIAGNOSIS — F10188 Alcohol abuse with other alcohol-induced disorder: Secondary | ICD-10-CM | POA: Diagnosis not present

## 2021-11-09 DIAGNOSIS — K319 Disease of stomach and duodenum, unspecified: Secondary | ICD-10-CM | POA: Diagnosis not present

## 2021-11-09 DIAGNOSIS — D649 Anemia, unspecified: Secondary | ICD-10-CM | POA: Diagnosis not present

## 2021-11-09 DIAGNOSIS — K219 Gastro-esophageal reflux disease without esophagitis: Secondary | ICD-10-CM | POA: Diagnosis not present

## 2021-11-09 DIAGNOSIS — D689 Coagulation defect, unspecified: Secondary | ICD-10-CM | POA: Diagnosis not present

## 2021-11-09 DIAGNOSIS — K2980 Duodenitis without bleeding: Secondary | ICD-10-CM | POA: Diagnosis not present

## 2021-11-09 DIAGNOSIS — R69 Illness, unspecified: Secondary | ICD-10-CM | POA: Diagnosis not present

## 2021-11-09 DIAGNOSIS — K7010 Alcoholic hepatitis without ascites: Secondary | ICD-10-CM | POA: Diagnosis not present

## 2021-11-09 DIAGNOSIS — K767 Hepatorenal syndrome: Secondary | ICD-10-CM | POA: Diagnosis not present

## 2021-11-09 DIAGNOSIS — Z8616 Personal history of COVID-19: Secondary | ICD-10-CM | POA: Diagnosis not present

## 2021-11-10 DIAGNOSIS — D649 Anemia, unspecified: Secondary | ICD-10-CM | POA: Diagnosis not present

## 2021-11-10 DIAGNOSIS — K2980 Duodenitis without bleeding: Secondary | ICD-10-CM | POA: Diagnosis not present

## 2021-11-10 DIAGNOSIS — E785 Hyperlipidemia, unspecified: Secondary | ICD-10-CM | POA: Diagnosis not present

## 2021-11-10 DIAGNOSIS — K319 Disease of stomach and duodenum, unspecified: Secondary | ICD-10-CM | POA: Diagnosis not present

## 2021-11-10 DIAGNOSIS — K767 Hepatorenal syndrome: Secondary | ICD-10-CM | POA: Diagnosis not present

## 2021-11-10 DIAGNOSIS — R69 Illness, unspecified: Secondary | ICD-10-CM | POA: Diagnosis not present

## 2021-11-10 DIAGNOSIS — D689 Coagulation defect, unspecified: Secondary | ICD-10-CM | POA: Diagnosis not present

## 2021-11-10 DIAGNOSIS — K7010 Alcoholic hepatitis without ascites: Secondary | ICD-10-CM | POA: Diagnosis not present

## 2021-11-10 DIAGNOSIS — Z8616 Personal history of COVID-19: Secondary | ICD-10-CM | POA: Diagnosis not present

## 2021-11-10 DIAGNOSIS — F10188 Alcohol abuse with other alcohol-induced disorder: Secondary | ICD-10-CM | POA: Diagnosis not present

## 2021-11-10 DIAGNOSIS — K219 Gastro-esophageal reflux disease without esophagitis: Secondary | ICD-10-CM | POA: Diagnosis not present

## 2021-11-11 DIAGNOSIS — F10188 Alcohol abuse with other alcohol-induced disorder: Secondary | ICD-10-CM | POA: Diagnosis not present

## 2021-11-11 DIAGNOSIS — K767 Hepatorenal syndrome: Secondary | ICD-10-CM | POA: Diagnosis not present

## 2021-11-11 DIAGNOSIS — Z8616 Personal history of COVID-19: Secondary | ICD-10-CM | POA: Diagnosis not present

## 2021-11-11 DIAGNOSIS — K319 Disease of stomach and duodenum, unspecified: Secondary | ICD-10-CM | POA: Diagnosis not present

## 2021-11-11 DIAGNOSIS — D689 Coagulation defect, unspecified: Secondary | ICD-10-CM | POA: Diagnosis not present

## 2021-11-11 DIAGNOSIS — E785 Hyperlipidemia, unspecified: Secondary | ICD-10-CM | POA: Diagnosis not present

## 2021-11-11 DIAGNOSIS — R69 Illness, unspecified: Secondary | ICD-10-CM | POA: Diagnosis not present

## 2021-11-11 DIAGNOSIS — D649 Anemia, unspecified: Secondary | ICD-10-CM | POA: Diagnosis not present

## 2021-11-11 DIAGNOSIS — K7010 Alcoholic hepatitis without ascites: Secondary | ICD-10-CM | POA: Diagnosis not present

## 2021-11-11 DIAGNOSIS — K219 Gastro-esophageal reflux disease without esophagitis: Secondary | ICD-10-CM | POA: Diagnosis not present

## 2021-11-11 DIAGNOSIS — K2980 Duodenitis without bleeding: Secondary | ICD-10-CM | POA: Diagnosis not present

## 2021-11-12 DIAGNOSIS — K319 Disease of stomach and duodenum, unspecified: Secondary | ICD-10-CM | POA: Diagnosis not present

## 2021-11-12 DIAGNOSIS — Z8616 Personal history of COVID-19: Secondary | ICD-10-CM | POA: Diagnosis not present

## 2021-11-12 DIAGNOSIS — R69 Illness, unspecified: Secondary | ICD-10-CM | POA: Diagnosis not present

## 2021-11-12 DIAGNOSIS — K219 Gastro-esophageal reflux disease without esophagitis: Secondary | ICD-10-CM | POA: Diagnosis not present

## 2021-11-12 DIAGNOSIS — D649 Anemia, unspecified: Secondary | ICD-10-CM | POA: Diagnosis not present

## 2021-11-12 DIAGNOSIS — K2980 Duodenitis without bleeding: Secondary | ICD-10-CM | POA: Diagnosis not present

## 2021-11-12 DIAGNOSIS — D689 Coagulation defect, unspecified: Secondary | ICD-10-CM | POA: Diagnosis not present

## 2021-11-12 DIAGNOSIS — E785 Hyperlipidemia, unspecified: Secondary | ICD-10-CM | POA: Diagnosis not present

## 2021-11-12 DIAGNOSIS — F10188 Alcohol abuse with other alcohol-induced disorder: Secondary | ICD-10-CM | POA: Diagnosis not present

## 2021-11-12 DIAGNOSIS — K7010 Alcoholic hepatitis without ascites: Secondary | ICD-10-CM | POA: Diagnosis not present

## 2021-11-12 DIAGNOSIS — K767 Hepatorenal syndrome: Secondary | ICD-10-CM | POA: Diagnosis not present

## 2021-11-13 DIAGNOSIS — K2980 Duodenitis without bleeding: Secondary | ICD-10-CM | POA: Diagnosis not present

## 2021-11-13 DIAGNOSIS — K7010 Alcoholic hepatitis without ascites: Secondary | ICD-10-CM | POA: Diagnosis not present

## 2021-11-13 DIAGNOSIS — F10188 Alcohol abuse with other alcohol-induced disorder: Secondary | ICD-10-CM | POA: Diagnosis not present

## 2021-11-13 DIAGNOSIS — K219 Gastro-esophageal reflux disease without esophagitis: Secondary | ICD-10-CM | POA: Diagnosis not present

## 2021-11-13 DIAGNOSIS — D689 Coagulation defect, unspecified: Secondary | ICD-10-CM | POA: Diagnosis not present

## 2021-11-13 DIAGNOSIS — R69 Illness, unspecified: Secondary | ICD-10-CM | POA: Diagnosis not present

## 2021-11-13 DIAGNOSIS — E785 Hyperlipidemia, unspecified: Secondary | ICD-10-CM | POA: Diagnosis not present

## 2021-11-13 DIAGNOSIS — K767 Hepatorenal syndrome: Secondary | ICD-10-CM | POA: Diagnosis not present

## 2021-11-13 DIAGNOSIS — K319 Disease of stomach and duodenum, unspecified: Secondary | ICD-10-CM | POA: Diagnosis not present

## 2021-11-13 DIAGNOSIS — D649 Anemia, unspecified: Secondary | ICD-10-CM | POA: Diagnosis not present

## 2021-11-13 DIAGNOSIS — Z8616 Personal history of COVID-19: Secondary | ICD-10-CM | POA: Diagnosis not present

## 2021-11-14 DIAGNOSIS — Z8616 Personal history of COVID-19: Secondary | ICD-10-CM | POA: Diagnosis not present

## 2021-11-14 DIAGNOSIS — K219 Gastro-esophageal reflux disease without esophagitis: Secondary | ICD-10-CM | POA: Diagnosis not present

## 2021-11-14 DIAGNOSIS — K7010 Alcoholic hepatitis without ascites: Secondary | ICD-10-CM | POA: Diagnosis not present

## 2021-11-14 DIAGNOSIS — R69 Illness, unspecified: Secondary | ICD-10-CM | POA: Diagnosis not present

## 2021-11-14 DIAGNOSIS — D649 Anemia, unspecified: Secondary | ICD-10-CM | POA: Diagnosis not present

## 2021-11-14 DIAGNOSIS — K2980 Duodenitis without bleeding: Secondary | ICD-10-CM | POA: Diagnosis not present

## 2021-11-14 DIAGNOSIS — F10188 Alcohol abuse with other alcohol-induced disorder: Secondary | ICD-10-CM | POA: Diagnosis not present

## 2021-11-14 DIAGNOSIS — E785 Hyperlipidemia, unspecified: Secondary | ICD-10-CM | POA: Diagnosis not present

## 2021-11-14 DIAGNOSIS — K319 Disease of stomach and duodenum, unspecified: Secondary | ICD-10-CM | POA: Diagnosis not present

## 2021-11-14 DIAGNOSIS — K767 Hepatorenal syndrome: Secondary | ICD-10-CM | POA: Diagnosis not present

## 2021-11-14 DIAGNOSIS — D689 Coagulation defect, unspecified: Secondary | ICD-10-CM | POA: Diagnosis not present

## 2021-11-15 DIAGNOSIS — E785 Hyperlipidemia, unspecified: Secondary | ICD-10-CM | POA: Diagnosis not present

## 2021-11-15 DIAGNOSIS — K7010 Alcoholic hepatitis without ascites: Secondary | ICD-10-CM | POA: Diagnosis not present

## 2021-11-15 DIAGNOSIS — D649 Anemia, unspecified: Secondary | ICD-10-CM | POA: Diagnosis not present

## 2021-11-15 DIAGNOSIS — K319 Disease of stomach and duodenum, unspecified: Secondary | ICD-10-CM | POA: Diagnosis not present

## 2021-11-15 DIAGNOSIS — Z8616 Personal history of COVID-19: Secondary | ICD-10-CM | POA: Diagnosis not present

## 2021-11-15 DIAGNOSIS — R69 Illness, unspecified: Secondary | ICD-10-CM | POA: Diagnosis not present

## 2021-11-15 DIAGNOSIS — F10188 Alcohol abuse with other alcohol-induced disorder: Secondary | ICD-10-CM | POA: Diagnosis not present

## 2021-11-15 DIAGNOSIS — K2980 Duodenitis without bleeding: Secondary | ICD-10-CM | POA: Diagnosis not present

## 2021-11-15 DIAGNOSIS — D689 Coagulation defect, unspecified: Secondary | ICD-10-CM | POA: Diagnosis not present

## 2021-11-15 DIAGNOSIS — K219 Gastro-esophageal reflux disease without esophagitis: Secondary | ICD-10-CM | POA: Diagnosis not present

## 2021-11-15 DIAGNOSIS — K767 Hepatorenal syndrome: Secondary | ICD-10-CM | POA: Diagnosis not present

## 2021-11-16 DIAGNOSIS — D689 Coagulation defect, unspecified: Secondary | ICD-10-CM | POA: Diagnosis not present

## 2021-11-16 DIAGNOSIS — K7010 Alcoholic hepatitis without ascites: Secondary | ICD-10-CM | POA: Diagnosis not present

## 2021-11-16 DIAGNOSIS — K2980 Duodenitis without bleeding: Secondary | ICD-10-CM | POA: Diagnosis not present

## 2021-11-16 DIAGNOSIS — F10188 Alcohol abuse with other alcohol-induced disorder: Secondary | ICD-10-CM | POA: Diagnosis not present

## 2021-11-16 DIAGNOSIS — K219 Gastro-esophageal reflux disease without esophagitis: Secondary | ICD-10-CM | POA: Diagnosis not present

## 2021-11-16 DIAGNOSIS — K767 Hepatorenal syndrome: Secondary | ICD-10-CM | POA: Diagnosis not present

## 2021-11-16 DIAGNOSIS — R69 Illness, unspecified: Secondary | ICD-10-CM | POA: Diagnosis not present

## 2021-11-16 DIAGNOSIS — E785 Hyperlipidemia, unspecified: Secondary | ICD-10-CM | POA: Diagnosis not present

## 2021-11-16 DIAGNOSIS — K319 Disease of stomach and duodenum, unspecified: Secondary | ICD-10-CM | POA: Diagnosis not present

## 2021-11-16 DIAGNOSIS — Z8616 Personal history of COVID-19: Secondary | ICD-10-CM | POA: Diagnosis not present

## 2021-11-16 DIAGNOSIS — D649 Anemia, unspecified: Secondary | ICD-10-CM | POA: Diagnosis not present

## 2021-11-17 DIAGNOSIS — K319 Disease of stomach and duodenum, unspecified: Secondary | ICD-10-CM | POA: Diagnosis not present

## 2021-11-17 DIAGNOSIS — K219 Gastro-esophageal reflux disease without esophagitis: Secondary | ICD-10-CM | POA: Diagnosis not present

## 2021-11-17 DIAGNOSIS — D649 Anemia, unspecified: Secondary | ICD-10-CM | POA: Diagnosis not present

## 2021-11-17 DIAGNOSIS — K767 Hepatorenal syndrome: Secondary | ICD-10-CM | POA: Diagnosis not present

## 2021-11-17 DIAGNOSIS — Z8616 Personal history of COVID-19: Secondary | ICD-10-CM | POA: Diagnosis not present

## 2021-11-17 DIAGNOSIS — E785 Hyperlipidemia, unspecified: Secondary | ICD-10-CM | POA: Diagnosis not present

## 2021-11-17 DIAGNOSIS — K7010 Alcoholic hepatitis without ascites: Secondary | ICD-10-CM | POA: Diagnosis not present

## 2021-11-17 DIAGNOSIS — R69 Illness, unspecified: Secondary | ICD-10-CM | POA: Diagnosis not present

## 2021-11-17 DIAGNOSIS — D689 Coagulation defect, unspecified: Secondary | ICD-10-CM | POA: Diagnosis not present

## 2021-11-17 DIAGNOSIS — F10188 Alcohol abuse with other alcohol-induced disorder: Secondary | ICD-10-CM | POA: Diagnosis not present

## 2021-11-17 DIAGNOSIS — K2980 Duodenitis without bleeding: Secondary | ICD-10-CM | POA: Diagnosis not present

## 2021-11-18 DIAGNOSIS — K219 Gastro-esophageal reflux disease without esophagitis: Secondary | ICD-10-CM | POA: Diagnosis not present

## 2021-11-18 DIAGNOSIS — K767 Hepatorenal syndrome: Secondary | ICD-10-CM | POA: Diagnosis not present

## 2021-11-18 DIAGNOSIS — D649 Anemia, unspecified: Secondary | ICD-10-CM | POA: Diagnosis not present

## 2021-11-18 DIAGNOSIS — D689 Coagulation defect, unspecified: Secondary | ICD-10-CM | POA: Diagnosis not present

## 2021-11-18 DIAGNOSIS — Z8616 Personal history of COVID-19: Secondary | ICD-10-CM | POA: Diagnosis not present

## 2021-11-18 DIAGNOSIS — E785 Hyperlipidemia, unspecified: Secondary | ICD-10-CM | POA: Diagnosis not present

## 2021-11-18 DIAGNOSIS — K2980 Duodenitis without bleeding: Secondary | ICD-10-CM | POA: Diagnosis not present

## 2021-11-18 DIAGNOSIS — F10188 Alcohol abuse with other alcohol-induced disorder: Secondary | ICD-10-CM | POA: Diagnosis not present

## 2021-11-18 DIAGNOSIS — K7010 Alcoholic hepatitis without ascites: Secondary | ICD-10-CM | POA: Diagnosis not present

## 2021-11-18 DIAGNOSIS — K319 Disease of stomach and duodenum, unspecified: Secondary | ICD-10-CM | POA: Diagnosis not present

## 2021-11-18 DIAGNOSIS — R69 Illness, unspecified: Secondary | ICD-10-CM | POA: Diagnosis not present

## 2021-11-19 DIAGNOSIS — K7010 Alcoholic hepatitis without ascites: Secondary | ICD-10-CM | POA: Diagnosis not present

## 2021-11-19 DIAGNOSIS — K767 Hepatorenal syndrome: Secondary | ICD-10-CM | POA: Diagnosis not present

## 2021-11-19 DIAGNOSIS — F10188 Alcohol abuse with other alcohol-induced disorder: Secondary | ICD-10-CM | POA: Diagnosis not present

## 2021-11-19 DIAGNOSIS — K219 Gastro-esophageal reflux disease without esophagitis: Secondary | ICD-10-CM | POA: Diagnosis not present

## 2021-11-19 DIAGNOSIS — R69 Illness, unspecified: Secondary | ICD-10-CM | POA: Diagnosis not present

## 2021-11-19 DIAGNOSIS — D689 Coagulation defect, unspecified: Secondary | ICD-10-CM | POA: Diagnosis not present

## 2021-11-19 DIAGNOSIS — E785 Hyperlipidemia, unspecified: Secondary | ICD-10-CM | POA: Diagnosis not present

## 2021-11-19 DIAGNOSIS — Z8616 Personal history of COVID-19: Secondary | ICD-10-CM | POA: Diagnosis not present

## 2021-11-19 DIAGNOSIS — K2980 Duodenitis without bleeding: Secondary | ICD-10-CM | POA: Diagnosis not present

## 2021-11-19 DIAGNOSIS — D649 Anemia, unspecified: Secondary | ICD-10-CM | POA: Diagnosis not present

## 2021-11-19 DIAGNOSIS — K319 Disease of stomach and duodenum, unspecified: Secondary | ICD-10-CM | POA: Diagnosis not present

## 2021-11-20 DIAGNOSIS — K7010 Alcoholic hepatitis without ascites: Secondary | ICD-10-CM | POA: Diagnosis not present

## 2021-11-20 DIAGNOSIS — K767 Hepatorenal syndrome: Secondary | ICD-10-CM | POA: Diagnosis not present

## 2021-11-20 DIAGNOSIS — F10188 Alcohol abuse with other alcohol-induced disorder: Secondary | ICD-10-CM | POA: Diagnosis not present

## 2021-11-20 DIAGNOSIS — R69 Illness, unspecified: Secondary | ICD-10-CM | POA: Diagnosis not present

## 2021-11-20 DIAGNOSIS — Z8616 Personal history of COVID-19: Secondary | ICD-10-CM | POA: Diagnosis not present

## 2021-11-20 DIAGNOSIS — K319 Disease of stomach and duodenum, unspecified: Secondary | ICD-10-CM | POA: Diagnosis not present

## 2021-11-20 DIAGNOSIS — K2980 Duodenitis without bleeding: Secondary | ICD-10-CM | POA: Diagnosis not present

## 2021-11-20 DIAGNOSIS — E785 Hyperlipidemia, unspecified: Secondary | ICD-10-CM | POA: Diagnosis not present

## 2021-11-20 DIAGNOSIS — D649 Anemia, unspecified: Secondary | ICD-10-CM | POA: Diagnosis not present

## 2021-11-20 DIAGNOSIS — K219 Gastro-esophageal reflux disease without esophagitis: Secondary | ICD-10-CM | POA: Diagnosis not present

## 2021-11-20 DIAGNOSIS — D689 Coagulation defect, unspecified: Secondary | ICD-10-CM | POA: Diagnosis not present

## 2021-11-21 DIAGNOSIS — K219 Gastro-esophageal reflux disease without esophagitis: Secondary | ICD-10-CM | POA: Diagnosis not present

## 2021-11-21 DIAGNOSIS — F10188 Alcohol abuse with other alcohol-induced disorder: Secondary | ICD-10-CM | POA: Diagnosis not present

## 2021-11-21 DIAGNOSIS — Z8616 Personal history of COVID-19: Secondary | ICD-10-CM | POA: Diagnosis not present

## 2021-11-21 DIAGNOSIS — K767 Hepatorenal syndrome: Secondary | ICD-10-CM | POA: Diagnosis not present

## 2021-11-21 DIAGNOSIS — D649 Anemia, unspecified: Secondary | ICD-10-CM | POA: Diagnosis not present

## 2021-11-21 DIAGNOSIS — K7010 Alcoholic hepatitis without ascites: Secondary | ICD-10-CM | POA: Diagnosis not present

## 2021-11-21 DIAGNOSIS — E785 Hyperlipidemia, unspecified: Secondary | ICD-10-CM | POA: Diagnosis not present

## 2021-11-21 DIAGNOSIS — K2980 Duodenitis without bleeding: Secondary | ICD-10-CM | POA: Diagnosis not present

## 2021-11-21 DIAGNOSIS — K319 Disease of stomach and duodenum, unspecified: Secondary | ICD-10-CM | POA: Diagnosis not present

## 2021-11-21 DIAGNOSIS — D689 Coagulation defect, unspecified: Secondary | ICD-10-CM | POA: Diagnosis not present

## 2021-11-21 DIAGNOSIS — R69 Illness, unspecified: Secondary | ICD-10-CM | POA: Diagnosis not present

## 2021-11-22 ENCOUNTER — Telehealth: Payer: Self-pay

## 2021-11-22 DIAGNOSIS — F10188 Alcohol abuse with other alcohol-induced disorder: Secondary | ICD-10-CM | POA: Diagnosis not present

## 2021-11-22 DIAGNOSIS — R69 Illness, unspecified: Secondary | ICD-10-CM | POA: Diagnosis not present

## 2021-11-22 DIAGNOSIS — K219 Gastro-esophageal reflux disease without esophagitis: Secondary | ICD-10-CM | POA: Diagnosis not present

## 2021-11-22 DIAGNOSIS — E785 Hyperlipidemia, unspecified: Secondary | ICD-10-CM | POA: Diagnosis not present

## 2021-11-22 DIAGNOSIS — D649 Anemia, unspecified: Secondary | ICD-10-CM | POA: Diagnosis not present

## 2021-11-22 DIAGNOSIS — Z8616 Personal history of COVID-19: Secondary | ICD-10-CM | POA: Diagnosis not present

## 2021-11-22 DIAGNOSIS — K767 Hepatorenal syndrome: Secondary | ICD-10-CM | POA: Diagnosis not present

## 2021-11-22 DIAGNOSIS — D689 Coagulation defect, unspecified: Secondary | ICD-10-CM | POA: Diagnosis not present

## 2021-11-22 DIAGNOSIS — K7010 Alcoholic hepatitis without ascites: Secondary | ICD-10-CM | POA: Diagnosis not present

## 2021-11-22 DIAGNOSIS — K2980 Duodenitis without bleeding: Secondary | ICD-10-CM | POA: Diagnosis not present

## 2021-11-22 DIAGNOSIS — K319 Disease of stomach and duodenum, unspecified: Secondary | ICD-10-CM | POA: Diagnosis not present

## 2021-11-22 NOTE — Telephone Encounter (Signed)
Pt callded in stating that he received a bill from [facility] Pt had called to check the status of his insurance. Pt was informed that his insurance is showing as elapsed. Pt states that he has insurance with [employer] and would like to know if this can be fixed or have his claim resubmitted.   Cb#: [phone]

## 2021-11-23 DIAGNOSIS — K2980 Duodenitis without bleeding: Secondary | ICD-10-CM | POA: Diagnosis not present

## 2021-11-23 DIAGNOSIS — K7010 Alcoholic hepatitis without ascites: Secondary | ICD-10-CM | POA: Diagnosis not present

## 2021-11-23 DIAGNOSIS — D689 Coagulation defect, unspecified: Secondary | ICD-10-CM | POA: Diagnosis not present

## 2021-11-23 DIAGNOSIS — E785 Hyperlipidemia, unspecified: Secondary | ICD-10-CM | POA: Diagnosis not present

## 2021-11-23 DIAGNOSIS — F10188 Alcohol abuse with other alcohol-induced disorder: Secondary | ICD-10-CM | POA: Diagnosis not present

## 2021-11-23 DIAGNOSIS — D649 Anemia, unspecified: Secondary | ICD-10-CM | POA: Diagnosis not present

## 2021-11-23 DIAGNOSIS — R69 Illness, unspecified: Secondary | ICD-10-CM | POA: Diagnosis not present

## 2021-11-23 DIAGNOSIS — K767 Hepatorenal syndrome: Secondary | ICD-10-CM | POA: Diagnosis not present

## 2021-11-23 DIAGNOSIS — K319 Disease of stomach and duodenum, unspecified: Secondary | ICD-10-CM | POA: Diagnosis not present

## 2021-11-23 DIAGNOSIS — Z8616 Personal history of COVID-19: Secondary | ICD-10-CM | POA: Diagnosis not present

## 2021-11-23 DIAGNOSIS — K219 Gastro-esophageal reflux disease without esophagitis: Secondary | ICD-10-CM | POA: Diagnosis not present

## 2021-11-24 DIAGNOSIS — E785 Hyperlipidemia, unspecified: Secondary | ICD-10-CM | POA: Diagnosis not present

## 2021-11-24 DIAGNOSIS — R69 Illness, unspecified: Secondary | ICD-10-CM | POA: Diagnosis not present

## 2021-11-24 DIAGNOSIS — K2980 Duodenitis without bleeding: Secondary | ICD-10-CM | POA: Diagnosis not present

## 2021-11-24 DIAGNOSIS — D649 Anemia, unspecified: Secondary | ICD-10-CM | POA: Diagnosis not present

## 2021-11-24 DIAGNOSIS — K7010 Alcoholic hepatitis without ascites: Secondary | ICD-10-CM | POA: Diagnosis not present

## 2021-11-24 DIAGNOSIS — K219 Gastro-esophageal reflux disease without esophagitis: Secondary | ICD-10-CM | POA: Diagnosis not present

## 2021-11-24 DIAGNOSIS — K767 Hepatorenal syndrome: Secondary | ICD-10-CM | POA: Diagnosis not present

## 2021-11-24 DIAGNOSIS — F10188 Alcohol abuse with other alcohol-induced disorder: Secondary | ICD-10-CM | POA: Diagnosis not present

## 2021-11-24 DIAGNOSIS — Z8616 Personal history of COVID-19: Secondary | ICD-10-CM | POA: Diagnosis not present

## 2021-11-24 DIAGNOSIS — D689 Coagulation defect, unspecified: Secondary | ICD-10-CM | POA: Diagnosis not present

## 2021-11-24 DIAGNOSIS — K319 Disease of stomach and duodenum, unspecified: Secondary | ICD-10-CM | POA: Diagnosis not present

## 2021-11-25 DIAGNOSIS — D649 Anemia, unspecified: Secondary | ICD-10-CM | POA: Diagnosis not present

## 2021-11-25 DIAGNOSIS — K7010 Alcoholic hepatitis without ascites: Secondary | ICD-10-CM | POA: Diagnosis not present

## 2021-11-25 DIAGNOSIS — E785 Hyperlipidemia, unspecified: Secondary | ICD-10-CM | POA: Diagnosis not present

## 2021-11-25 DIAGNOSIS — Z8616 Personal history of COVID-19: Secondary | ICD-10-CM | POA: Diagnosis not present

## 2021-11-25 DIAGNOSIS — K767 Hepatorenal syndrome: Secondary | ICD-10-CM | POA: Diagnosis not present

## 2021-11-25 DIAGNOSIS — K2980 Duodenitis without bleeding: Secondary | ICD-10-CM | POA: Diagnosis not present

## 2021-11-25 DIAGNOSIS — K219 Gastro-esophageal reflux disease without esophagitis: Secondary | ICD-10-CM | POA: Diagnosis not present

## 2021-11-25 DIAGNOSIS — R69 Illness, unspecified: Secondary | ICD-10-CM | POA: Diagnosis not present

## 2021-11-25 DIAGNOSIS — K319 Disease of stomach and duodenum, unspecified: Secondary | ICD-10-CM | POA: Diagnosis not present

## 2021-11-25 DIAGNOSIS — F10188 Alcohol abuse with other alcohol-induced disorder: Secondary | ICD-10-CM | POA: Diagnosis not present

## 2021-11-25 DIAGNOSIS — D689 Coagulation defect, unspecified: Secondary | ICD-10-CM | POA: Diagnosis not present

## 2021-11-26 DIAGNOSIS — K219 Gastro-esophageal reflux disease without esophagitis: Secondary | ICD-10-CM | POA: Diagnosis not present

## 2021-11-26 DIAGNOSIS — K767 Hepatorenal syndrome: Secondary | ICD-10-CM | POA: Diagnosis not present

## 2021-11-26 DIAGNOSIS — D649 Anemia, unspecified: Secondary | ICD-10-CM | POA: Diagnosis not present

## 2021-11-26 DIAGNOSIS — K319 Disease of stomach and duodenum, unspecified: Secondary | ICD-10-CM | POA: Diagnosis not present

## 2021-11-26 DIAGNOSIS — D689 Coagulation defect, unspecified: Secondary | ICD-10-CM | POA: Diagnosis not present

## 2021-11-26 DIAGNOSIS — E785 Hyperlipidemia, unspecified: Secondary | ICD-10-CM | POA: Diagnosis not present

## 2021-11-26 DIAGNOSIS — R69 Illness, unspecified: Secondary | ICD-10-CM | POA: Diagnosis not present

## 2021-11-26 DIAGNOSIS — K2980 Duodenitis without bleeding: Secondary | ICD-10-CM | POA: Diagnosis not present

## 2021-11-26 DIAGNOSIS — K7010 Alcoholic hepatitis without ascites: Secondary | ICD-10-CM | POA: Diagnosis not present

## 2021-11-26 DIAGNOSIS — F10188 Alcohol abuse with other alcohol-induced disorder: Secondary | ICD-10-CM | POA: Diagnosis not present

## 2021-11-26 DIAGNOSIS — Z8616 Personal history of COVID-19: Secondary | ICD-10-CM | POA: Diagnosis not present

## 2021-11-27 DIAGNOSIS — E785 Hyperlipidemia, unspecified: Secondary | ICD-10-CM | POA: Diagnosis not present

## 2021-11-27 DIAGNOSIS — K319 Disease of stomach and duodenum, unspecified: Secondary | ICD-10-CM | POA: Diagnosis not present

## 2021-11-27 DIAGNOSIS — R69 Illness, unspecified: Secondary | ICD-10-CM | POA: Diagnosis not present

## 2021-11-27 DIAGNOSIS — D689 Coagulation defect, unspecified: Secondary | ICD-10-CM | POA: Diagnosis not present

## 2021-11-27 DIAGNOSIS — K7010 Alcoholic hepatitis without ascites: Secondary | ICD-10-CM | POA: Diagnosis not present

## 2021-11-27 DIAGNOSIS — Z8616 Personal history of COVID-19: Secondary | ICD-10-CM | POA: Diagnosis not present

## 2021-11-27 DIAGNOSIS — K2980 Duodenitis without bleeding: Secondary | ICD-10-CM | POA: Diagnosis not present

## 2021-11-27 DIAGNOSIS — F10188 Alcohol abuse with other alcohol-induced disorder: Secondary | ICD-10-CM | POA: Diagnosis not present

## 2021-11-27 DIAGNOSIS — K767 Hepatorenal syndrome: Secondary | ICD-10-CM | POA: Diagnosis not present

## 2021-11-27 DIAGNOSIS — D649 Anemia, unspecified: Secondary | ICD-10-CM | POA: Diagnosis not present

## 2021-11-27 DIAGNOSIS — K219 Gastro-esophageal reflux disease without esophagitis: Secondary | ICD-10-CM | POA: Diagnosis not present

## 2021-11-28 DIAGNOSIS — K219 Gastro-esophageal reflux disease without esophagitis: Secondary | ICD-10-CM | POA: Diagnosis not present

## 2021-11-28 DIAGNOSIS — F10188 Alcohol abuse with other alcohol-induced disorder: Secondary | ICD-10-CM | POA: Diagnosis not present

## 2021-11-28 DIAGNOSIS — K7010 Alcoholic hepatitis without ascites: Secondary | ICD-10-CM | POA: Diagnosis not present

## 2021-11-28 DIAGNOSIS — Z8616 Personal history of COVID-19: Secondary | ICD-10-CM | POA: Diagnosis not present

## 2021-11-28 DIAGNOSIS — E785 Hyperlipidemia, unspecified: Secondary | ICD-10-CM | POA: Diagnosis not present

## 2021-11-28 DIAGNOSIS — K319 Disease of stomach and duodenum, unspecified: Secondary | ICD-10-CM | POA: Diagnosis not present

## 2021-11-28 DIAGNOSIS — K767 Hepatorenal syndrome: Secondary | ICD-10-CM | POA: Diagnosis not present

## 2021-11-28 DIAGNOSIS — K2980 Duodenitis without bleeding: Secondary | ICD-10-CM | POA: Diagnosis not present

## 2021-11-28 DIAGNOSIS — R69 Illness, unspecified: Secondary | ICD-10-CM | POA: Diagnosis not present

## 2021-11-28 DIAGNOSIS — D689 Coagulation defect, unspecified: Secondary | ICD-10-CM | POA: Diagnosis not present

## 2021-11-28 DIAGNOSIS — D649 Anemia, unspecified: Secondary | ICD-10-CM | POA: Diagnosis not present

## 2021-11-29 DIAGNOSIS — K767 Hepatorenal syndrome: Secondary | ICD-10-CM | POA: Diagnosis not present

## 2021-11-29 DIAGNOSIS — K219 Gastro-esophageal reflux disease without esophagitis: Secondary | ICD-10-CM | POA: Diagnosis not present

## 2021-11-29 DIAGNOSIS — E785 Hyperlipidemia, unspecified: Secondary | ICD-10-CM | POA: Diagnosis not present

## 2021-11-29 DIAGNOSIS — K2980 Duodenitis without bleeding: Secondary | ICD-10-CM | POA: Diagnosis not present

## 2021-11-29 DIAGNOSIS — K7010 Alcoholic hepatitis without ascites: Secondary | ICD-10-CM | POA: Diagnosis not present

## 2021-11-29 DIAGNOSIS — D649 Anemia, unspecified: Secondary | ICD-10-CM | POA: Diagnosis not present

## 2021-11-29 DIAGNOSIS — D689 Coagulation defect, unspecified: Secondary | ICD-10-CM | POA: Diagnosis not present

## 2021-11-29 DIAGNOSIS — K319 Disease of stomach and duodenum, unspecified: Secondary | ICD-10-CM | POA: Diagnosis not present

## 2021-11-29 DIAGNOSIS — Z8616 Personal history of COVID-19: Secondary | ICD-10-CM | POA: Diagnosis not present

## 2021-11-29 DIAGNOSIS — R69 Illness, unspecified: Secondary | ICD-10-CM | POA: Diagnosis not present

## 2021-11-29 DIAGNOSIS — F10188 Alcohol abuse with other alcohol-induced disorder: Secondary | ICD-10-CM | POA: Diagnosis not present

## 2021-11-30 DIAGNOSIS — D689 Coagulation defect, unspecified: Secondary | ICD-10-CM | POA: Diagnosis not present

## 2021-11-30 DIAGNOSIS — E785 Hyperlipidemia, unspecified: Secondary | ICD-10-CM | POA: Diagnosis not present

## 2021-11-30 DIAGNOSIS — D649 Anemia, unspecified: Secondary | ICD-10-CM | POA: Diagnosis not present

## 2021-11-30 DIAGNOSIS — K767 Hepatorenal syndrome: Secondary | ICD-10-CM | POA: Diagnosis not present

## 2021-11-30 DIAGNOSIS — R69 Illness, unspecified: Secondary | ICD-10-CM | POA: Diagnosis not present

## 2021-11-30 DIAGNOSIS — K2980 Duodenitis without bleeding: Secondary | ICD-10-CM | POA: Diagnosis not present

## 2021-11-30 DIAGNOSIS — K319 Disease of stomach and duodenum, unspecified: Secondary | ICD-10-CM | POA: Diagnosis not present

## 2021-11-30 DIAGNOSIS — K7010 Alcoholic hepatitis without ascites: Secondary | ICD-10-CM | POA: Diagnosis not present

## 2021-11-30 DIAGNOSIS — K219 Gastro-esophageal reflux disease without esophagitis: Secondary | ICD-10-CM | POA: Diagnosis not present

## 2021-11-30 DIAGNOSIS — F10188 Alcohol abuse with other alcohol-induced disorder: Secondary | ICD-10-CM | POA: Diagnosis not present

## 2021-11-30 DIAGNOSIS — Z8616 Personal history of COVID-19: Secondary | ICD-10-CM | POA: Diagnosis not present

## 2021-12-01 DIAGNOSIS — K319 Disease of stomach and duodenum, unspecified: Secondary | ICD-10-CM | POA: Diagnosis not present

## 2021-12-01 DIAGNOSIS — R69 Illness, unspecified: Secondary | ICD-10-CM | POA: Diagnosis not present

## 2021-12-01 DIAGNOSIS — D649 Anemia, unspecified: Secondary | ICD-10-CM | POA: Diagnosis not present

## 2021-12-01 DIAGNOSIS — E785 Hyperlipidemia, unspecified: Secondary | ICD-10-CM | POA: Diagnosis not present

## 2021-12-01 DIAGNOSIS — K767 Hepatorenal syndrome: Secondary | ICD-10-CM | POA: Diagnosis not present

## 2021-12-01 DIAGNOSIS — Z8616 Personal history of COVID-19: Secondary | ICD-10-CM | POA: Diagnosis not present

## 2021-12-01 DIAGNOSIS — D689 Coagulation defect, unspecified: Secondary | ICD-10-CM | POA: Diagnosis not present

## 2021-12-01 DIAGNOSIS — K2980 Duodenitis without bleeding: Secondary | ICD-10-CM | POA: Diagnosis not present

## 2021-12-01 DIAGNOSIS — K7010 Alcoholic hepatitis without ascites: Secondary | ICD-10-CM | POA: Diagnosis not present

## 2021-12-01 DIAGNOSIS — K219 Gastro-esophageal reflux disease without esophagitis: Secondary | ICD-10-CM | POA: Diagnosis not present

## 2021-12-01 DIAGNOSIS — F10188 Alcohol abuse with other alcohol-induced disorder: Secondary | ICD-10-CM | POA: Diagnosis not present

## 2021-12-02 DIAGNOSIS — D689 Coagulation defect, unspecified: Secondary | ICD-10-CM | POA: Diagnosis not present

## 2021-12-02 DIAGNOSIS — E785 Hyperlipidemia, unspecified: Secondary | ICD-10-CM | POA: Diagnosis not present

## 2021-12-02 DIAGNOSIS — K767 Hepatorenal syndrome: Secondary | ICD-10-CM | POA: Diagnosis not present

## 2021-12-02 DIAGNOSIS — K319 Disease of stomach and duodenum, unspecified: Secondary | ICD-10-CM | POA: Diagnosis not present

## 2021-12-02 DIAGNOSIS — R69 Illness, unspecified: Secondary | ICD-10-CM | POA: Diagnosis not present

## 2021-12-02 DIAGNOSIS — D649 Anemia, unspecified: Secondary | ICD-10-CM | POA: Diagnosis not present

## 2021-12-02 DIAGNOSIS — K219 Gastro-esophageal reflux disease without esophagitis: Secondary | ICD-10-CM | POA: Diagnosis not present

## 2021-12-02 DIAGNOSIS — K7010 Alcoholic hepatitis without ascites: Secondary | ICD-10-CM | POA: Diagnosis not present

## 2021-12-02 DIAGNOSIS — Z8616 Personal history of COVID-19: Secondary | ICD-10-CM | POA: Diagnosis not present

## 2021-12-02 DIAGNOSIS — F10188 Alcohol abuse with other alcohol-induced disorder: Secondary | ICD-10-CM | POA: Diagnosis not present

## 2021-12-02 DIAGNOSIS — K2980 Duodenitis without bleeding: Secondary | ICD-10-CM | POA: Diagnosis not present

## 2021-12-03 DIAGNOSIS — Z8616 Personal history of COVID-19: Secondary | ICD-10-CM | POA: Diagnosis not present

## 2021-12-03 DIAGNOSIS — K319 Disease of stomach and duodenum, unspecified: Secondary | ICD-10-CM | POA: Diagnosis not present

## 2021-12-03 DIAGNOSIS — E785 Hyperlipidemia, unspecified: Secondary | ICD-10-CM | POA: Diagnosis not present

## 2021-12-03 DIAGNOSIS — K219 Gastro-esophageal reflux disease without esophagitis: Secondary | ICD-10-CM | POA: Diagnosis not present

## 2021-12-03 DIAGNOSIS — K7010 Alcoholic hepatitis without ascites: Secondary | ICD-10-CM | POA: Diagnosis not present

## 2021-12-03 DIAGNOSIS — K2980 Duodenitis without bleeding: Secondary | ICD-10-CM | POA: Diagnosis not present

## 2021-12-03 DIAGNOSIS — D689 Coagulation defect, unspecified: Secondary | ICD-10-CM | POA: Diagnosis not present

## 2021-12-03 DIAGNOSIS — F10188 Alcohol abuse with other alcohol-induced disorder: Secondary | ICD-10-CM | POA: Diagnosis not present

## 2021-12-03 DIAGNOSIS — K767 Hepatorenal syndrome: Secondary | ICD-10-CM | POA: Diagnosis not present

## 2021-12-03 DIAGNOSIS — D649 Anemia, unspecified: Secondary | ICD-10-CM | POA: Diagnosis not present

## 2021-12-03 DIAGNOSIS — R69 Illness, unspecified: Secondary | ICD-10-CM | POA: Diagnosis not present

## 2021-12-04 DIAGNOSIS — K219 Gastro-esophageal reflux disease without esophagitis: Secondary | ICD-10-CM | POA: Diagnosis not present

## 2021-12-04 DIAGNOSIS — K2980 Duodenitis without bleeding: Secondary | ICD-10-CM | POA: Diagnosis not present

## 2021-12-04 DIAGNOSIS — D649 Anemia, unspecified: Secondary | ICD-10-CM | POA: Diagnosis not present

## 2021-12-04 DIAGNOSIS — F10188 Alcohol abuse with other alcohol-induced disorder: Secondary | ICD-10-CM | POA: Diagnosis not present

## 2021-12-04 DIAGNOSIS — K767 Hepatorenal syndrome: Secondary | ICD-10-CM | POA: Diagnosis not present

## 2021-12-04 DIAGNOSIS — Z8616 Personal history of COVID-19: Secondary | ICD-10-CM | POA: Diagnosis not present

## 2021-12-04 DIAGNOSIS — K319 Disease of stomach and duodenum, unspecified: Secondary | ICD-10-CM | POA: Diagnosis not present

## 2021-12-04 DIAGNOSIS — D689 Coagulation defect, unspecified: Secondary | ICD-10-CM | POA: Diagnosis not present

## 2021-12-04 DIAGNOSIS — R69 Illness, unspecified: Secondary | ICD-10-CM | POA: Diagnosis not present

## 2021-12-04 DIAGNOSIS — E785 Hyperlipidemia, unspecified: Secondary | ICD-10-CM | POA: Diagnosis not present

## 2021-12-04 DIAGNOSIS — K7010 Alcoholic hepatitis without ascites: Secondary | ICD-10-CM | POA: Diagnosis not present

## 2021-12-05 DIAGNOSIS — K767 Hepatorenal syndrome: Secondary | ICD-10-CM | POA: Diagnosis not present

## 2021-12-05 DIAGNOSIS — D689 Coagulation defect, unspecified: Secondary | ICD-10-CM | POA: Diagnosis not present

## 2021-12-05 DIAGNOSIS — K219 Gastro-esophageal reflux disease without esophagitis: Secondary | ICD-10-CM | POA: Diagnosis not present

## 2021-12-05 DIAGNOSIS — R69 Illness, unspecified: Secondary | ICD-10-CM | POA: Diagnosis not present

## 2021-12-05 DIAGNOSIS — Z8616 Personal history of COVID-19: Secondary | ICD-10-CM | POA: Diagnosis not present

## 2021-12-05 DIAGNOSIS — E785 Hyperlipidemia, unspecified: Secondary | ICD-10-CM | POA: Diagnosis not present

## 2021-12-05 DIAGNOSIS — D649 Anemia, unspecified: Secondary | ICD-10-CM | POA: Diagnosis not present

## 2021-12-05 DIAGNOSIS — K7010 Alcoholic hepatitis without ascites: Secondary | ICD-10-CM | POA: Diagnosis not present

## 2021-12-05 DIAGNOSIS — K2980 Duodenitis without bleeding: Secondary | ICD-10-CM | POA: Diagnosis not present

## 2021-12-05 DIAGNOSIS — F10188 Alcohol abuse with other alcohol-induced disorder: Secondary | ICD-10-CM | POA: Diagnosis not present

## 2021-12-05 DIAGNOSIS — K319 Disease of stomach and duodenum, unspecified: Secondary | ICD-10-CM | POA: Diagnosis not present

## 2021-12-06 DIAGNOSIS — K2980 Duodenitis without bleeding: Secondary | ICD-10-CM | POA: Diagnosis not present

## 2021-12-06 DIAGNOSIS — K219 Gastro-esophageal reflux disease without esophagitis: Secondary | ICD-10-CM | POA: Diagnosis not present

## 2021-12-06 DIAGNOSIS — R69 Illness, unspecified: Secondary | ICD-10-CM | POA: Diagnosis not present

## 2021-12-06 DIAGNOSIS — D689 Coagulation defect, unspecified: Secondary | ICD-10-CM | POA: Diagnosis not present

## 2021-12-06 DIAGNOSIS — K319 Disease of stomach and duodenum, unspecified: Secondary | ICD-10-CM | POA: Diagnosis not present

## 2021-12-06 DIAGNOSIS — K7010 Alcoholic hepatitis without ascites: Secondary | ICD-10-CM | POA: Diagnosis not present

## 2021-12-06 DIAGNOSIS — F10188 Alcohol abuse with other alcohol-induced disorder: Secondary | ICD-10-CM | POA: Diagnosis not present

## 2021-12-06 DIAGNOSIS — K767 Hepatorenal syndrome: Secondary | ICD-10-CM | POA: Diagnosis not present

## 2021-12-06 DIAGNOSIS — Z8616 Personal history of COVID-19: Secondary | ICD-10-CM | POA: Diagnosis not present

## 2021-12-06 DIAGNOSIS — D649 Anemia, unspecified: Secondary | ICD-10-CM | POA: Diagnosis not present

## 2021-12-06 DIAGNOSIS — E785 Hyperlipidemia, unspecified: Secondary | ICD-10-CM | POA: Diagnosis not present

## 2021-12-07 DIAGNOSIS — F10188 Alcohol abuse with other alcohol-induced disorder: Secondary | ICD-10-CM | POA: Diagnosis not present

## 2021-12-07 DIAGNOSIS — D649 Anemia, unspecified: Secondary | ICD-10-CM | POA: Diagnosis not present

## 2021-12-07 DIAGNOSIS — K2980 Duodenitis without bleeding: Secondary | ICD-10-CM | POA: Diagnosis not present

## 2021-12-07 DIAGNOSIS — K219 Gastro-esophageal reflux disease without esophagitis: Secondary | ICD-10-CM | POA: Diagnosis not present

## 2021-12-07 DIAGNOSIS — D689 Coagulation defect, unspecified: Secondary | ICD-10-CM | POA: Diagnosis not present

## 2021-12-07 DIAGNOSIS — K767 Hepatorenal syndrome: Secondary | ICD-10-CM | POA: Diagnosis not present

## 2021-12-07 DIAGNOSIS — Z8616 Personal history of COVID-19: Secondary | ICD-10-CM | POA: Diagnosis not present

## 2021-12-07 DIAGNOSIS — E785 Hyperlipidemia, unspecified: Secondary | ICD-10-CM | POA: Diagnosis not present

## 2021-12-07 DIAGNOSIS — K7010 Alcoholic hepatitis without ascites: Secondary | ICD-10-CM | POA: Diagnosis not present

## 2021-12-07 DIAGNOSIS — K319 Disease of stomach and duodenum, unspecified: Secondary | ICD-10-CM | POA: Diagnosis not present

## 2021-12-07 DIAGNOSIS — R69 Illness, unspecified: Secondary | ICD-10-CM | POA: Diagnosis not present

## 2021-12-08 DIAGNOSIS — K219 Gastro-esophageal reflux disease without esophagitis: Secondary | ICD-10-CM | POA: Diagnosis not present

## 2021-12-08 DIAGNOSIS — K767 Hepatorenal syndrome: Secondary | ICD-10-CM | POA: Diagnosis not present

## 2021-12-08 DIAGNOSIS — K2980 Duodenitis without bleeding: Secondary | ICD-10-CM | POA: Diagnosis not present

## 2021-12-08 DIAGNOSIS — R69 Illness, unspecified: Secondary | ICD-10-CM | POA: Diagnosis not present

## 2021-12-08 DIAGNOSIS — Z8616 Personal history of COVID-19: Secondary | ICD-10-CM | POA: Diagnosis not present

## 2021-12-08 DIAGNOSIS — D689 Coagulation defect, unspecified: Secondary | ICD-10-CM | POA: Diagnosis not present

## 2021-12-08 DIAGNOSIS — F10188 Alcohol abuse with other alcohol-induced disorder: Secondary | ICD-10-CM | POA: Diagnosis not present

## 2021-12-08 DIAGNOSIS — E785 Hyperlipidemia, unspecified: Secondary | ICD-10-CM | POA: Diagnosis not present

## 2021-12-08 DIAGNOSIS — K7010 Alcoholic hepatitis without ascites: Secondary | ICD-10-CM | POA: Diagnosis not present

## 2021-12-08 DIAGNOSIS — D649 Anemia, unspecified: Secondary | ICD-10-CM | POA: Diagnosis not present

## 2021-12-08 DIAGNOSIS — K319 Disease of stomach and duodenum, unspecified: Secondary | ICD-10-CM | POA: Diagnosis not present

## 2021-12-09 DIAGNOSIS — K7010 Alcoholic hepatitis without ascites: Secondary | ICD-10-CM | POA: Diagnosis not present

## 2021-12-09 DIAGNOSIS — D689 Coagulation defect, unspecified: Secondary | ICD-10-CM | POA: Diagnosis not present

## 2021-12-09 DIAGNOSIS — R69 Illness, unspecified: Secondary | ICD-10-CM | POA: Diagnosis not present

## 2021-12-09 DIAGNOSIS — K2980 Duodenitis without bleeding: Secondary | ICD-10-CM | POA: Diagnosis not present

## 2021-12-09 DIAGNOSIS — E785 Hyperlipidemia, unspecified: Secondary | ICD-10-CM | POA: Diagnosis not present

## 2021-12-09 DIAGNOSIS — F10188 Alcohol abuse with other alcohol-induced disorder: Secondary | ICD-10-CM | POA: Diagnosis not present

## 2021-12-09 DIAGNOSIS — Z8616 Personal history of COVID-19: Secondary | ICD-10-CM | POA: Diagnosis not present

## 2021-12-09 DIAGNOSIS — K767 Hepatorenal syndrome: Secondary | ICD-10-CM | POA: Diagnosis not present

## 2021-12-09 DIAGNOSIS — K219 Gastro-esophageal reflux disease without esophagitis: Secondary | ICD-10-CM | POA: Diagnosis not present

## 2021-12-09 DIAGNOSIS — D649 Anemia, unspecified: Secondary | ICD-10-CM | POA: Diagnosis not present

## 2021-12-09 DIAGNOSIS — K319 Disease of stomach and duodenum, unspecified: Secondary | ICD-10-CM | POA: Diagnosis not present

## 2021-12-10 DIAGNOSIS — D649 Anemia, unspecified: Secondary | ICD-10-CM | POA: Diagnosis not present

## 2021-12-10 DIAGNOSIS — F10188 Alcohol abuse with other alcohol-induced disorder: Secondary | ICD-10-CM | POA: Diagnosis not present

## 2021-12-10 DIAGNOSIS — K2980 Duodenitis without bleeding: Secondary | ICD-10-CM | POA: Diagnosis not present

## 2021-12-10 DIAGNOSIS — K319 Disease of stomach and duodenum, unspecified: Secondary | ICD-10-CM | POA: Diagnosis not present

## 2021-12-10 DIAGNOSIS — R69 Illness, unspecified: Secondary | ICD-10-CM | POA: Diagnosis not present

## 2021-12-10 DIAGNOSIS — K767 Hepatorenal syndrome: Secondary | ICD-10-CM | POA: Diagnosis not present

## 2021-12-10 DIAGNOSIS — E785 Hyperlipidemia, unspecified: Secondary | ICD-10-CM | POA: Diagnosis not present

## 2021-12-10 DIAGNOSIS — Z8616 Personal history of COVID-19: Secondary | ICD-10-CM | POA: Diagnosis not present

## 2021-12-10 DIAGNOSIS — K7010 Alcoholic hepatitis without ascites: Secondary | ICD-10-CM | POA: Diagnosis not present

## 2021-12-10 DIAGNOSIS — D689 Coagulation defect, unspecified: Secondary | ICD-10-CM | POA: Diagnosis not present

## 2021-12-10 DIAGNOSIS — K219 Gastro-esophageal reflux disease without esophagitis: Secondary | ICD-10-CM | POA: Diagnosis not present

## 2021-12-11 DIAGNOSIS — K319 Disease of stomach and duodenum, unspecified: Secondary | ICD-10-CM | POA: Diagnosis not present

## 2021-12-11 DIAGNOSIS — K767 Hepatorenal syndrome: Secondary | ICD-10-CM | POA: Diagnosis not present

## 2021-12-11 DIAGNOSIS — F10188 Alcohol abuse with other alcohol-induced disorder: Secondary | ICD-10-CM | POA: Diagnosis not present

## 2021-12-11 DIAGNOSIS — Z8616 Personal history of COVID-19: Secondary | ICD-10-CM | POA: Diagnosis not present

## 2021-12-11 DIAGNOSIS — R69 Illness, unspecified: Secondary | ICD-10-CM | POA: Diagnosis not present

## 2021-12-11 DIAGNOSIS — K2980 Duodenitis without bleeding: Secondary | ICD-10-CM | POA: Diagnosis not present

## 2021-12-11 DIAGNOSIS — K7010 Alcoholic hepatitis without ascites: Secondary | ICD-10-CM | POA: Diagnosis not present

## 2021-12-11 DIAGNOSIS — D689 Coagulation defect, unspecified: Secondary | ICD-10-CM | POA: Diagnosis not present

## 2021-12-11 DIAGNOSIS — D649 Anemia, unspecified: Secondary | ICD-10-CM | POA: Diagnosis not present

## 2021-12-11 DIAGNOSIS — K219 Gastro-esophageal reflux disease without esophagitis: Secondary | ICD-10-CM | POA: Diagnosis not present

## 2021-12-11 DIAGNOSIS — E785 Hyperlipidemia, unspecified: Secondary | ICD-10-CM | POA: Diagnosis not present

## 2021-12-12 DIAGNOSIS — Z8616 Personal history of COVID-19: Secondary | ICD-10-CM | POA: Diagnosis not present

## 2021-12-12 DIAGNOSIS — E785 Hyperlipidemia, unspecified: Secondary | ICD-10-CM | POA: Diagnosis not present

## 2021-12-12 DIAGNOSIS — D649 Anemia, unspecified: Secondary | ICD-10-CM | POA: Diagnosis not present

## 2021-12-12 DIAGNOSIS — D689 Coagulation defect, unspecified: Secondary | ICD-10-CM | POA: Diagnosis not present

## 2021-12-12 DIAGNOSIS — K7010 Alcoholic hepatitis without ascites: Secondary | ICD-10-CM | POA: Diagnosis not present

## 2021-12-12 DIAGNOSIS — F10188 Alcohol abuse with other alcohol-induced disorder: Secondary | ICD-10-CM | POA: Diagnosis not present

## 2021-12-12 DIAGNOSIS — R69 Illness, unspecified: Secondary | ICD-10-CM | POA: Diagnosis not present

## 2021-12-12 DIAGNOSIS — K319 Disease of stomach and duodenum, unspecified: Secondary | ICD-10-CM | POA: Diagnosis not present

## 2021-12-12 DIAGNOSIS — K2980 Duodenitis without bleeding: Secondary | ICD-10-CM | POA: Diagnosis not present

## 2021-12-12 DIAGNOSIS — K767 Hepatorenal syndrome: Secondary | ICD-10-CM | POA: Diagnosis not present

## 2021-12-12 DIAGNOSIS — K219 Gastro-esophageal reflux disease without esophagitis: Secondary | ICD-10-CM | POA: Diagnosis not present

## 2021-12-13 DIAGNOSIS — D649 Anemia, unspecified: Secondary | ICD-10-CM | POA: Diagnosis not present

## 2021-12-13 DIAGNOSIS — D689 Coagulation defect, unspecified: Secondary | ICD-10-CM | POA: Diagnosis not present

## 2021-12-13 DIAGNOSIS — K2980 Duodenitis without bleeding: Secondary | ICD-10-CM | POA: Diagnosis not present

## 2021-12-13 DIAGNOSIS — R69 Illness, unspecified: Secondary | ICD-10-CM | POA: Diagnosis not present

## 2021-12-13 DIAGNOSIS — K219 Gastro-esophageal reflux disease without esophagitis: Secondary | ICD-10-CM | POA: Diagnosis not present

## 2021-12-13 DIAGNOSIS — K7010 Alcoholic hepatitis without ascites: Secondary | ICD-10-CM | POA: Diagnosis not present

## 2021-12-13 DIAGNOSIS — K767 Hepatorenal syndrome: Secondary | ICD-10-CM | POA: Diagnosis not present

## 2021-12-13 DIAGNOSIS — K319 Disease of stomach and duodenum, unspecified: Secondary | ICD-10-CM | POA: Diagnosis not present

## 2021-12-13 DIAGNOSIS — F10188 Alcohol abuse with other alcohol-induced disorder: Secondary | ICD-10-CM | POA: Diagnosis not present

## 2021-12-13 DIAGNOSIS — Z8616 Personal history of COVID-19: Secondary | ICD-10-CM | POA: Diagnosis not present

## 2021-12-13 DIAGNOSIS — E785 Hyperlipidemia, unspecified: Secondary | ICD-10-CM | POA: Diagnosis not present

## 2021-12-14 DIAGNOSIS — K767 Hepatorenal syndrome: Secondary | ICD-10-CM | POA: Diagnosis not present

## 2021-12-14 DIAGNOSIS — D649 Anemia, unspecified: Secondary | ICD-10-CM | POA: Diagnosis not present

## 2021-12-14 DIAGNOSIS — D689 Coagulation defect, unspecified: Secondary | ICD-10-CM | POA: Diagnosis not present

## 2021-12-14 DIAGNOSIS — Z8616 Personal history of COVID-19: Secondary | ICD-10-CM | POA: Diagnosis not present

## 2021-12-14 DIAGNOSIS — K2980 Duodenitis without bleeding: Secondary | ICD-10-CM | POA: Diagnosis not present

## 2021-12-14 DIAGNOSIS — K7010 Alcoholic hepatitis without ascites: Secondary | ICD-10-CM | POA: Diagnosis not present

## 2021-12-14 DIAGNOSIS — F10188 Alcohol abuse with other alcohol-induced disorder: Secondary | ICD-10-CM | POA: Diagnosis not present

## 2021-12-14 DIAGNOSIS — K319 Disease of stomach and duodenum, unspecified: Secondary | ICD-10-CM | POA: Diagnosis not present

## 2021-12-14 DIAGNOSIS — R69 Illness, unspecified: Secondary | ICD-10-CM | POA: Diagnosis not present

## 2021-12-14 DIAGNOSIS — K219 Gastro-esophageal reflux disease without esophagitis: Secondary | ICD-10-CM | POA: Diagnosis not present

## 2021-12-14 DIAGNOSIS — E785 Hyperlipidemia, unspecified: Secondary | ICD-10-CM | POA: Diagnosis not present

## 2021-12-15 DIAGNOSIS — K7010 Alcoholic hepatitis without ascites: Secondary | ICD-10-CM | POA: Diagnosis not present

## 2021-12-15 DIAGNOSIS — K219 Gastro-esophageal reflux disease without esophagitis: Secondary | ICD-10-CM | POA: Diagnosis not present

## 2021-12-15 DIAGNOSIS — K2980 Duodenitis without bleeding: Secondary | ICD-10-CM | POA: Diagnosis not present

## 2021-12-15 DIAGNOSIS — Z8616 Personal history of COVID-19: Secondary | ICD-10-CM | POA: Diagnosis not present

## 2021-12-15 DIAGNOSIS — D689 Coagulation defect, unspecified: Secondary | ICD-10-CM | POA: Diagnosis not present

## 2021-12-15 DIAGNOSIS — K319 Disease of stomach and duodenum, unspecified: Secondary | ICD-10-CM | POA: Diagnosis not present

## 2021-12-15 DIAGNOSIS — R69 Illness, unspecified: Secondary | ICD-10-CM | POA: Diagnosis not present

## 2021-12-15 DIAGNOSIS — K767 Hepatorenal syndrome: Secondary | ICD-10-CM | POA: Diagnosis not present

## 2021-12-15 DIAGNOSIS — F10188 Alcohol abuse with other alcohol-induced disorder: Secondary | ICD-10-CM | POA: Diagnosis not present

## 2021-12-15 DIAGNOSIS — D649 Anemia, unspecified: Secondary | ICD-10-CM | POA: Diagnosis not present

## 2021-12-15 DIAGNOSIS — E785 Hyperlipidemia, unspecified: Secondary | ICD-10-CM | POA: Diagnosis not present

## 2021-12-16 DIAGNOSIS — D689 Coagulation defect, unspecified: Secondary | ICD-10-CM | POA: Diagnosis not present

## 2021-12-16 DIAGNOSIS — K767 Hepatorenal syndrome: Secondary | ICD-10-CM | POA: Diagnosis not present

## 2021-12-16 DIAGNOSIS — D649 Anemia, unspecified: Secondary | ICD-10-CM | POA: Diagnosis not present

## 2021-12-16 DIAGNOSIS — F10188 Alcohol abuse with other alcohol-induced disorder: Secondary | ICD-10-CM | POA: Diagnosis not present

## 2021-12-16 DIAGNOSIS — K2980 Duodenitis without bleeding: Secondary | ICD-10-CM | POA: Diagnosis not present

## 2021-12-16 DIAGNOSIS — K219 Gastro-esophageal reflux disease without esophagitis: Secondary | ICD-10-CM | POA: Diagnosis not present

## 2021-12-16 DIAGNOSIS — K319 Disease of stomach and duodenum, unspecified: Secondary | ICD-10-CM | POA: Diagnosis not present

## 2021-12-16 DIAGNOSIS — Z8616 Personal history of COVID-19: Secondary | ICD-10-CM | POA: Diagnosis not present

## 2021-12-16 DIAGNOSIS — E785 Hyperlipidemia, unspecified: Secondary | ICD-10-CM | POA: Diagnosis not present

## 2021-12-16 DIAGNOSIS — R69 Illness, unspecified: Secondary | ICD-10-CM | POA: Diagnosis not present

## 2021-12-16 DIAGNOSIS — K7010 Alcoholic hepatitis without ascites: Secondary | ICD-10-CM | POA: Diagnosis not present

## 2021-12-17 DIAGNOSIS — K767 Hepatorenal syndrome: Secondary | ICD-10-CM | POA: Diagnosis not present

## 2021-12-17 DIAGNOSIS — K219 Gastro-esophageal reflux disease without esophagitis: Secondary | ICD-10-CM | POA: Diagnosis not present

## 2021-12-17 DIAGNOSIS — R69 Illness, unspecified: Secondary | ICD-10-CM | POA: Diagnosis not present

## 2021-12-17 DIAGNOSIS — D649 Anemia, unspecified: Secondary | ICD-10-CM | POA: Diagnosis not present

## 2021-12-17 DIAGNOSIS — D689 Coagulation defect, unspecified: Secondary | ICD-10-CM | POA: Diagnosis not present

## 2021-12-17 DIAGNOSIS — E785 Hyperlipidemia, unspecified: Secondary | ICD-10-CM | POA: Diagnosis not present

## 2021-12-17 DIAGNOSIS — K319 Disease of stomach and duodenum, unspecified: Secondary | ICD-10-CM | POA: Diagnosis not present

## 2021-12-17 DIAGNOSIS — Z8616 Personal history of COVID-19: Secondary | ICD-10-CM | POA: Diagnosis not present

## 2021-12-17 DIAGNOSIS — K7010 Alcoholic hepatitis without ascites: Secondary | ICD-10-CM | POA: Diagnosis not present

## 2021-12-17 DIAGNOSIS — K2980 Duodenitis without bleeding: Secondary | ICD-10-CM | POA: Diagnosis not present

## 2021-12-17 DIAGNOSIS — F10188 Alcohol abuse with other alcohol-induced disorder: Secondary | ICD-10-CM | POA: Diagnosis not present

## 2021-12-18 DIAGNOSIS — K2980 Duodenitis without bleeding: Secondary | ICD-10-CM | POA: Diagnosis not present

## 2021-12-18 DIAGNOSIS — E785 Hyperlipidemia, unspecified: Secondary | ICD-10-CM | POA: Diagnosis not present

## 2021-12-18 DIAGNOSIS — K219 Gastro-esophageal reflux disease without esophagitis: Secondary | ICD-10-CM | POA: Diagnosis not present

## 2021-12-18 DIAGNOSIS — D649 Anemia, unspecified: Secondary | ICD-10-CM | POA: Diagnosis not present

## 2021-12-18 DIAGNOSIS — K319 Disease of stomach and duodenum, unspecified: Secondary | ICD-10-CM | POA: Diagnosis not present

## 2021-12-18 DIAGNOSIS — K767 Hepatorenal syndrome: Secondary | ICD-10-CM | POA: Diagnosis not present

## 2021-12-18 DIAGNOSIS — Z8616 Personal history of COVID-19: Secondary | ICD-10-CM | POA: Diagnosis not present

## 2021-12-18 DIAGNOSIS — K7010 Alcoholic hepatitis without ascites: Secondary | ICD-10-CM | POA: Diagnosis not present

## 2021-12-18 DIAGNOSIS — F10188 Alcohol abuse with other alcohol-induced disorder: Secondary | ICD-10-CM | POA: Diagnosis not present

## 2021-12-18 DIAGNOSIS — R69 Illness, unspecified: Secondary | ICD-10-CM | POA: Diagnosis not present

## 2021-12-18 DIAGNOSIS — D689 Coagulation defect, unspecified: Secondary | ICD-10-CM | POA: Diagnosis not present

## 2021-12-19 DIAGNOSIS — K319 Disease of stomach and duodenum, unspecified: Secondary | ICD-10-CM | POA: Diagnosis not present

## 2021-12-19 DIAGNOSIS — K2980 Duodenitis without bleeding: Secondary | ICD-10-CM | POA: Diagnosis not present

## 2021-12-19 DIAGNOSIS — E785 Hyperlipidemia, unspecified: Secondary | ICD-10-CM | POA: Diagnosis not present

## 2021-12-19 DIAGNOSIS — K767 Hepatorenal syndrome: Secondary | ICD-10-CM | POA: Diagnosis not present

## 2021-12-19 DIAGNOSIS — K7010 Alcoholic hepatitis without ascites: Secondary | ICD-10-CM | POA: Diagnosis not present

## 2021-12-19 DIAGNOSIS — F10188 Alcohol abuse with other alcohol-induced disorder: Secondary | ICD-10-CM | POA: Diagnosis not present

## 2021-12-19 DIAGNOSIS — Z8616 Personal history of COVID-19: Secondary | ICD-10-CM | POA: Diagnosis not present

## 2021-12-19 DIAGNOSIS — R69 Illness, unspecified: Secondary | ICD-10-CM | POA: Diagnosis not present

## 2021-12-19 DIAGNOSIS — D649 Anemia, unspecified: Secondary | ICD-10-CM | POA: Diagnosis not present

## 2021-12-19 DIAGNOSIS — D689 Coagulation defect, unspecified: Secondary | ICD-10-CM | POA: Diagnosis not present

## 2021-12-19 DIAGNOSIS — K219 Gastro-esophageal reflux disease without esophagitis: Secondary | ICD-10-CM | POA: Diagnosis not present

## 2021-12-20 ENCOUNTER — Other Ambulatory Visit: Payer: Self-pay

## 2021-12-20 ENCOUNTER — Telehealth: Payer: Self-pay | Admitting: Family Medicine

## 2021-12-20 ENCOUNTER — Other Ambulatory Visit: Payer: No Typology Code available for payment source

## 2021-12-20 DIAGNOSIS — K319 Disease of stomach and duodenum, unspecified: Secondary | ICD-10-CM | POA: Diagnosis not present

## 2021-12-20 DIAGNOSIS — F10188 Alcohol abuse with other alcohol-induced disorder: Secondary | ICD-10-CM | POA: Diagnosis not present

## 2021-12-20 DIAGNOSIS — R69 Illness, unspecified: Secondary | ICD-10-CM | POA: Diagnosis not present

## 2021-12-20 DIAGNOSIS — K219 Gastro-esophageal reflux disease without esophagitis: Secondary | ICD-10-CM | POA: Diagnosis not present

## 2021-12-20 DIAGNOSIS — Z8616 Personal history of COVID-19: Secondary | ICD-10-CM | POA: Diagnosis not present

## 2021-12-20 DIAGNOSIS — K2980 Duodenitis without bleeding: Secondary | ICD-10-CM | POA: Diagnosis not present

## 2021-12-20 DIAGNOSIS — K7010 Alcoholic hepatitis without ascites: Secondary | ICD-10-CM

## 2021-12-20 DIAGNOSIS — D649 Anemia, unspecified: Secondary | ICD-10-CM | POA: Diagnosis not present

## 2021-12-20 DIAGNOSIS — R17 Unspecified jaundice: Secondary | ICD-10-CM

## 2021-12-20 DIAGNOSIS — K767 Hepatorenal syndrome: Secondary | ICD-10-CM | POA: Diagnosis not present

## 2021-12-20 DIAGNOSIS — E785 Hyperlipidemia, unspecified: Secondary | ICD-10-CM | POA: Diagnosis not present

## 2021-12-20 DIAGNOSIS — K7030 Alcoholic cirrhosis of liver without ascites: Secondary | ICD-10-CM

## 2021-12-20 DIAGNOSIS — D689 Coagulation defect, unspecified: Secondary | ICD-10-CM | POA: Diagnosis not present

## 2021-12-20 NOTE — Telephone Encounter (Signed)
Patient called to make sure his insurance payments are being received and processed. Requesting call back.   Please advise at [phone].

## 2021-12-21 DIAGNOSIS — K219 Gastro-esophageal reflux disease without esophagitis: Secondary | ICD-10-CM | POA: Diagnosis not present

## 2021-12-21 DIAGNOSIS — K767 Hepatorenal syndrome: Secondary | ICD-10-CM | POA: Diagnosis not present

## 2021-12-21 DIAGNOSIS — R69 Illness, unspecified: Secondary | ICD-10-CM | POA: Diagnosis not present

## 2021-12-21 DIAGNOSIS — K319 Disease of stomach and duodenum, unspecified: Secondary | ICD-10-CM | POA: Diagnosis not present

## 2021-12-21 DIAGNOSIS — Z8616 Personal history of COVID-19: Secondary | ICD-10-CM | POA: Diagnosis not present

## 2021-12-21 DIAGNOSIS — K7010 Alcoholic hepatitis without ascites: Secondary | ICD-10-CM | POA: Diagnosis not present

## 2021-12-21 DIAGNOSIS — E785 Hyperlipidemia, unspecified: Secondary | ICD-10-CM | POA: Diagnosis not present

## 2021-12-21 DIAGNOSIS — D689 Coagulation defect, unspecified: Secondary | ICD-10-CM | POA: Diagnosis not present

## 2021-12-21 DIAGNOSIS — K2980 Duodenitis without bleeding: Secondary | ICD-10-CM | POA: Diagnosis not present

## 2021-12-21 DIAGNOSIS — F10188 Alcohol abuse with other alcohol-induced disorder: Secondary | ICD-10-CM | POA: Diagnosis not present

## 2021-12-21 DIAGNOSIS — D649 Anemia, unspecified: Secondary | ICD-10-CM | POA: Diagnosis not present

## 2021-12-21 LAB — HEPATIC FUNCTION PANEL
AG Ratio: 1.3 (calc) (ref 1.0–2.5)
ALT: 12 U/L (ref 9–46)
AST: 28 U/L (ref 10–40)
Albumin: 3.6 g/dL (ref 3.6–5.1)
Alkaline phosphatase (APISO): 122 U/L (ref 36–130)
Bilirubin, Direct: 0.8 mg/dL — ABNORMAL HIGH (ref 0.0–0.2)
Globulin: 2.7 g/dL (calc) (ref 1.9–3.7)
Indirect Bilirubin: 1.9 mg/dL (calc) — ABNORMAL HIGH (ref 0.2–1.2)
Total Bilirubin: 2.7 mg/dL — ABNORMAL HIGH (ref 0.2–1.2)
Total Protein: 6.3 g/dL (ref 6.1–8.1)

## 2021-12-22 DIAGNOSIS — Z8616 Personal history of COVID-19: Secondary | ICD-10-CM | POA: Diagnosis not present

## 2021-12-22 DIAGNOSIS — K2980 Duodenitis without bleeding: Secondary | ICD-10-CM | POA: Diagnosis not present

## 2021-12-22 DIAGNOSIS — D649 Anemia, unspecified: Secondary | ICD-10-CM | POA: Diagnosis not present

## 2021-12-22 DIAGNOSIS — K319 Disease of stomach and duodenum, unspecified: Secondary | ICD-10-CM | POA: Diagnosis not present

## 2021-12-22 DIAGNOSIS — E785 Hyperlipidemia, unspecified: Secondary | ICD-10-CM | POA: Diagnosis not present

## 2021-12-22 DIAGNOSIS — R69 Illness, unspecified: Secondary | ICD-10-CM | POA: Diagnosis not present

## 2021-12-22 DIAGNOSIS — K767 Hepatorenal syndrome: Secondary | ICD-10-CM | POA: Diagnosis not present

## 2021-12-22 DIAGNOSIS — K219 Gastro-esophageal reflux disease without esophagitis: Secondary | ICD-10-CM | POA: Diagnosis not present

## 2021-12-22 DIAGNOSIS — K7010 Alcoholic hepatitis without ascites: Secondary | ICD-10-CM | POA: Diagnosis not present

## 2021-12-22 DIAGNOSIS — D689 Coagulation defect, unspecified: Secondary | ICD-10-CM | POA: Diagnosis not present

## 2021-12-22 DIAGNOSIS — F10188 Alcohol abuse with other alcohol-induced disorder: Secondary | ICD-10-CM | POA: Diagnosis not present

## 2021-12-23 DIAGNOSIS — Z8616 Personal history of COVID-19: Secondary | ICD-10-CM | POA: Diagnosis not present

## 2021-12-23 DIAGNOSIS — K319 Disease of stomach and duodenum, unspecified: Secondary | ICD-10-CM | POA: Diagnosis not present

## 2021-12-23 DIAGNOSIS — R69 Illness, unspecified: Secondary | ICD-10-CM | POA: Diagnosis not present

## 2021-12-23 DIAGNOSIS — K2980 Duodenitis without bleeding: Secondary | ICD-10-CM | POA: Diagnosis not present

## 2021-12-23 DIAGNOSIS — K7010 Alcoholic hepatitis without ascites: Secondary | ICD-10-CM | POA: Diagnosis not present

## 2021-12-23 DIAGNOSIS — D649 Anemia, unspecified: Secondary | ICD-10-CM | POA: Diagnosis not present

## 2021-12-23 DIAGNOSIS — K767 Hepatorenal syndrome: Secondary | ICD-10-CM | POA: Diagnosis not present

## 2021-12-23 DIAGNOSIS — D689 Coagulation defect, unspecified: Secondary | ICD-10-CM | POA: Diagnosis not present

## 2021-12-23 DIAGNOSIS — F10188 Alcohol abuse with other alcohol-induced disorder: Secondary | ICD-10-CM | POA: Diagnosis not present

## 2021-12-23 DIAGNOSIS — E785 Hyperlipidemia, unspecified: Secondary | ICD-10-CM | POA: Diagnosis not present

## 2021-12-23 DIAGNOSIS — K219 Gastro-esophageal reflux disease without esophagitis: Secondary | ICD-10-CM | POA: Diagnosis not present

## 2021-12-24 DIAGNOSIS — K219 Gastro-esophageal reflux disease without esophagitis: Secondary | ICD-10-CM | POA: Diagnosis not present

## 2021-12-24 DIAGNOSIS — D689 Coagulation defect, unspecified: Secondary | ICD-10-CM | POA: Diagnosis not present

## 2021-12-24 DIAGNOSIS — K2980 Duodenitis without bleeding: Secondary | ICD-10-CM | POA: Diagnosis not present

## 2021-12-24 DIAGNOSIS — K319 Disease of stomach and duodenum, unspecified: Secondary | ICD-10-CM | POA: Diagnosis not present

## 2021-12-24 DIAGNOSIS — R69 Illness, unspecified: Secondary | ICD-10-CM | POA: Diagnosis not present

## 2021-12-24 DIAGNOSIS — K767 Hepatorenal syndrome: Secondary | ICD-10-CM | POA: Diagnosis not present

## 2021-12-24 DIAGNOSIS — E785 Hyperlipidemia, unspecified: Secondary | ICD-10-CM | POA: Diagnosis not present

## 2021-12-24 DIAGNOSIS — F10188 Alcohol abuse with other alcohol-induced disorder: Secondary | ICD-10-CM | POA: Diagnosis not present

## 2021-12-24 DIAGNOSIS — D649 Anemia, unspecified: Secondary | ICD-10-CM | POA: Diagnosis not present

## 2021-12-24 DIAGNOSIS — K7010 Alcoholic hepatitis without ascites: Secondary | ICD-10-CM | POA: Diagnosis not present

## 2021-12-24 DIAGNOSIS — Z8616 Personal history of COVID-19: Secondary | ICD-10-CM | POA: Diagnosis not present

## 2021-12-25 DIAGNOSIS — K7010 Alcoholic hepatitis without ascites: Secondary | ICD-10-CM | POA: Diagnosis not present

## 2021-12-25 DIAGNOSIS — R69 Illness, unspecified: Secondary | ICD-10-CM | POA: Diagnosis not present

## 2021-12-25 DIAGNOSIS — F10188 Alcohol abuse with other alcohol-induced disorder: Secondary | ICD-10-CM | POA: Diagnosis not present

## 2021-12-25 DIAGNOSIS — K2980 Duodenitis without bleeding: Secondary | ICD-10-CM | POA: Diagnosis not present

## 2021-12-25 DIAGNOSIS — E785 Hyperlipidemia, unspecified: Secondary | ICD-10-CM | POA: Diagnosis not present

## 2021-12-25 DIAGNOSIS — K319 Disease of stomach and duodenum, unspecified: Secondary | ICD-10-CM | POA: Diagnosis not present

## 2021-12-25 DIAGNOSIS — D689 Coagulation defect, unspecified: Secondary | ICD-10-CM | POA: Diagnosis not present

## 2021-12-25 DIAGNOSIS — K767 Hepatorenal syndrome: Secondary | ICD-10-CM | POA: Diagnosis not present

## 2021-12-25 DIAGNOSIS — D649 Anemia, unspecified: Secondary | ICD-10-CM | POA: Diagnosis not present

## 2021-12-25 DIAGNOSIS — Z8616 Personal history of COVID-19: Secondary | ICD-10-CM | POA: Diagnosis not present

## 2021-12-25 DIAGNOSIS — K219 Gastro-esophageal reflux disease without esophagitis: Secondary | ICD-10-CM | POA: Diagnosis not present

## 2021-12-26 DIAGNOSIS — E785 Hyperlipidemia, unspecified: Secondary | ICD-10-CM | POA: Diagnosis not present

## 2021-12-26 DIAGNOSIS — D689 Coagulation defect, unspecified: Secondary | ICD-10-CM | POA: Diagnosis not present

## 2021-12-26 DIAGNOSIS — F10188 Alcohol abuse with other alcohol-induced disorder: Secondary | ICD-10-CM | POA: Diagnosis not present

## 2021-12-26 DIAGNOSIS — K2980 Duodenitis without bleeding: Secondary | ICD-10-CM | POA: Diagnosis not present

## 2021-12-26 DIAGNOSIS — K7010 Alcoholic hepatitis without ascites: Secondary | ICD-10-CM | POA: Diagnosis not present

## 2021-12-26 DIAGNOSIS — D649 Anemia, unspecified: Secondary | ICD-10-CM | POA: Diagnosis not present

## 2021-12-26 DIAGNOSIS — Z8616 Personal history of COVID-19: Secondary | ICD-10-CM | POA: Diagnosis not present

## 2021-12-26 DIAGNOSIS — K219 Gastro-esophageal reflux disease without esophagitis: Secondary | ICD-10-CM | POA: Diagnosis not present

## 2021-12-26 DIAGNOSIS — R69 Illness, unspecified: Secondary | ICD-10-CM | POA: Diagnosis not present

## 2021-12-26 DIAGNOSIS — K319 Disease of stomach and duodenum, unspecified: Secondary | ICD-10-CM | POA: Diagnosis not present

## 2021-12-26 DIAGNOSIS — K767 Hepatorenal syndrome: Secondary | ICD-10-CM | POA: Diagnosis not present

## 2021-12-27 DIAGNOSIS — K7010 Alcoholic hepatitis without ascites: Secondary | ICD-10-CM | POA: Diagnosis not present

## 2021-12-27 DIAGNOSIS — E785 Hyperlipidemia, unspecified: Secondary | ICD-10-CM | POA: Diagnosis not present

## 2021-12-27 DIAGNOSIS — Z8616 Personal history of COVID-19: Secondary | ICD-10-CM | POA: Diagnosis not present

## 2021-12-27 DIAGNOSIS — F10188 Alcohol abuse with other alcohol-induced disorder: Secondary | ICD-10-CM | POA: Diagnosis not present

## 2021-12-27 DIAGNOSIS — R69 Illness, unspecified: Secondary | ICD-10-CM | POA: Diagnosis not present

## 2021-12-27 DIAGNOSIS — K319 Disease of stomach and duodenum, unspecified: Secondary | ICD-10-CM | POA: Diagnosis not present

## 2021-12-27 DIAGNOSIS — K2980 Duodenitis without bleeding: Secondary | ICD-10-CM | POA: Diagnosis not present

## 2021-12-27 DIAGNOSIS — D649 Anemia, unspecified: Secondary | ICD-10-CM | POA: Diagnosis not present

## 2021-12-27 DIAGNOSIS — K219 Gastro-esophageal reflux disease without esophagitis: Secondary | ICD-10-CM | POA: Diagnosis not present

## 2021-12-27 DIAGNOSIS — K767 Hepatorenal syndrome: Secondary | ICD-10-CM | POA: Diagnosis not present

## 2021-12-27 DIAGNOSIS — D689 Coagulation defect, unspecified: Secondary | ICD-10-CM | POA: Diagnosis not present

## 2021-12-28 DIAGNOSIS — K7010 Alcoholic hepatitis without ascites: Secondary | ICD-10-CM | POA: Diagnosis not present

## 2021-12-28 DIAGNOSIS — E785 Hyperlipidemia, unspecified: Secondary | ICD-10-CM | POA: Diagnosis not present

## 2021-12-28 DIAGNOSIS — D649 Anemia, unspecified: Secondary | ICD-10-CM | POA: Diagnosis not present

## 2021-12-28 DIAGNOSIS — D689 Coagulation defect, unspecified: Secondary | ICD-10-CM | POA: Diagnosis not present

## 2021-12-28 DIAGNOSIS — R69 Illness, unspecified: Secondary | ICD-10-CM | POA: Diagnosis not present

## 2021-12-28 DIAGNOSIS — F10188 Alcohol abuse with other alcohol-induced disorder: Secondary | ICD-10-CM | POA: Diagnosis not present

## 2021-12-28 DIAGNOSIS — K767 Hepatorenal syndrome: Secondary | ICD-10-CM | POA: Diagnosis not present

## 2021-12-28 DIAGNOSIS — K319 Disease of stomach and duodenum, unspecified: Secondary | ICD-10-CM | POA: Diagnosis not present

## 2021-12-28 DIAGNOSIS — K2980 Duodenitis without bleeding: Secondary | ICD-10-CM | POA: Diagnosis not present

## 2021-12-28 DIAGNOSIS — K219 Gastro-esophageal reflux disease without esophagitis: Secondary | ICD-10-CM | POA: Diagnosis not present

## 2021-12-28 DIAGNOSIS — Z8616 Personal history of COVID-19: Secondary | ICD-10-CM | POA: Diagnosis not present

## 2021-12-29 DIAGNOSIS — K319 Disease of stomach and duodenum, unspecified: Secondary | ICD-10-CM | POA: Diagnosis not present

## 2021-12-29 DIAGNOSIS — D649 Anemia, unspecified: Secondary | ICD-10-CM | POA: Diagnosis not present

## 2021-12-29 DIAGNOSIS — K2980 Duodenitis without bleeding: Secondary | ICD-10-CM | POA: Diagnosis not present

## 2021-12-29 DIAGNOSIS — K767 Hepatorenal syndrome: Secondary | ICD-10-CM | POA: Diagnosis not present

## 2021-12-29 DIAGNOSIS — Z8616 Personal history of COVID-19: Secondary | ICD-10-CM | POA: Diagnosis not present

## 2021-12-29 DIAGNOSIS — F10188 Alcohol abuse with other alcohol-induced disorder: Secondary | ICD-10-CM | POA: Diagnosis not present

## 2021-12-29 DIAGNOSIS — K7010 Alcoholic hepatitis without ascites: Secondary | ICD-10-CM | POA: Diagnosis not present

## 2021-12-29 DIAGNOSIS — E785 Hyperlipidemia, unspecified: Secondary | ICD-10-CM | POA: Diagnosis not present

## 2021-12-29 DIAGNOSIS — D689 Coagulation defect, unspecified: Secondary | ICD-10-CM | POA: Diagnosis not present

## 2021-12-29 DIAGNOSIS — K219 Gastro-esophageal reflux disease without esophagitis: Secondary | ICD-10-CM | POA: Diagnosis not present

## 2021-12-29 DIAGNOSIS — R69 Illness, unspecified: Secondary | ICD-10-CM | POA: Diagnosis not present

## 2021-12-30 DIAGNOSIS — K767 Hepatorenal syndrome: Secondary | ICD-10-CM | POA: Diagnosis not present

## 2021-12-30 DIAGNOSIS — R69 Illness, unspecified: Secondary | ICD-10-CM | POA: Diagnosis not present

## 2021-12-30 DIAGNOSIS — K7010 Alcoholic hepatitis without ascites: Secondary | ICD-10-CM | POA: Diagnosis not present

## 2021-12-30 DIAGNOSIS — D649 Anemia, unspecified: Secondary | ICD-10-CM | POA: Diagnosis not present

## 2021-12-30 DIAGNOSIS — E785 Hyperlipidemia, unspecified: Secondary | ICD-10-CM | POA: Diagnosis not present

## 2021-12-30 DIAGNOSIS — F10188 Alcohol abuse with other alcohol-induced disorder: Secondary | ICD-10-CM | POA: Diagnosis not present

## 2021-12-30 DIAGNOSIS — D689 Coagulation defect, unspecified: Secondary | ICD-10-CM | POA: Diagnosis not present

## 2021-12-30 DIAGNOSIS — Z8616 Personal history of COVID-19: Secondary | ICD-10-CM | POA: Diagnosis not present

## 2021-12-30 DIAGNOSIS — K319 Disease of stomach and duodenum, unspecified: Secondary | ICD-10-CM | POA: Diagnosis not present

## 2021-12-30 DIAGNOSIS — K2980 Duodenitis without bleeding: Secondary | ICD-10-CM | POA: Diagnosis not present

## 2021-12-30 DIAGNOSIS — K219 Gastro-esophageal reflux disease without esophagitis: Secondary | ICD-10-CM | POA: Diagnosis not present

## 2021-12-31 DIAGNOSIS — R69 Illness, unspecified: Secondary | ICD-10-CM | POA: Diagnosis not present

## 2021-12-31 DIAGNOSIS — D689 Coagulation defect, unspecified: Secondary | ICD-10-CM | POA: Diagnosis not present

## 2021-12-31 DIAGNOSIS — Z8616 Personal history of COVID-19: Secondary | ICD-10-CM | POA: Diagnosis not present

## 2021-12-31 DIAGNOSIS — F10188 Alcohol abuse with other alcohol-induced disorder: Secondary | ICD-10-CM | POA: Diagnosis not present

## 2021-12-31 DIAGNOSIS — K767 Hepatorenal syndrome: Secondary | ICD-10-CM | POA: Diagnosis not present

## 2021-12-31 DIAGNOSIS — E785 Hyperlipidemia, unspecified: Secondary | ICD-10-CM | POA: Diagnosis not present

## 2021-12-31 DIAGNOSIS — K219 Gastro-esophageal reflux disease without esophagitis: Secondary | ICD-10-CM | POA: Diagnosis not present

## 2021-12-31 DIAGNOSIS — K2980 Duodenitis without bleeding: Secondary | ICD-10-CM | POA: Diagnosis not present

## 2021-12-31 DIAGNOSIS — D649 Anemia, unspecified: Secondary | ICD-10-CM | POA: Diagnosis not present

## 2021-12-31 DIAGNOSIS — K7010 Alcoholic hepatitis without ascites: Secondary | ICD-10-CM | POA: Diagnosis not present

## 2021-12-31 DIAGNOSIS — K319 Disease of stomach and duodenum, unspecified: Secondary | ICD-10-CM | POA: Diagnosis not present

## 2022-01-01 DIAGNOSIS — R69 Illness, unspecified: Secondary | ICD-10-CM | POA: Diagnosis not present

## 2022-01-01 DIAGNOSIS — K319 Disease of stomach and duodenum, unspecified: Secondary | ICD-10-CM | POA: Diagnosis not present

## 2022-01-01 DIAGNOSIS — D689 Coagulation defect, unspecified: Secondary | ICD-10-CM | POA: Diagnosis not present

## 2022-01-01 DIAGNOSIS — F10188 Alcohol abuse with other alcohol-induced disorder: Secondary | ICD-10-CM | POA: Diagnosis not present

## 2022-01-01 DIAGNOSIS — K219 Gastro-esophageal reflux disease without esophagitis: Secondary | ICD-10-CM | POA: Diagnosis not present

## 2022-01-01 DIAGNOSIS — K2980 Duodenitis without bleeding: Secondary | ICD-10-CM | POA: Diagnosis not present

## 2022-01-01 DIAGNOSIS — K767 Hepatorenal syndrome: Secondary | ICD-10-CM | POA: Diagnosis not present

## 2022-01-01 DIAGNOSIS — D649 Anemia, unspecified: Secondary | ICD-10-CM | POA: Diagnosis not present

## 2022-01-01 DIAGNOSIS — K7010 Alcoholic hepatitis without ascites: Secondary | ICD-10-CM | POA: Diagnosis not present

## 2022-01-01 DIAGNOSIS — E785 Hyperlipidemia, unspecified: Secondary | ICD-10-CM | POA: Diagnosis not present

## 2022-01-01 DIAGNOSIS — Z8616 Personal history of COVID-19: Secondary | ICD-10-CM | POA: Diagnosis not present

## 2022-01-02 DIAGNOSIS — Z8616 Personal history of COVID-19: Secondary | ICD-10-CM | POA: Diagnosis not present

## 2022-01-02 DIAGNOSIS — F10188 Alcohol abuse with other alcohol-induced disorder: Secondary | ICD-10-CM | POA: Diagnosis not present

## 2022-01-02 DIAGNOSIS — K7010 Alcoholic hepatitis without ascites: Secondary | ICD-10-CM | POA: Diagnosis not present

## 2022-01-02 DIAGNOSIS — K319 Disease of stomach and duodenum, unspecified: Secondary | ICD-10-CM | POA: Diagnosis not present

## 2022-01-02 DIAGNOSIS — K219 Gastro-esophageal reflux disease without esophagitis: Secondary | ICD-10-CM | POA: Diagnosis not present

## 2022-01-02 DIAGNOSIS — K2980 Duodenitis without bleeding: Secondary | ICD-10-CM | POA: Diagnosis not present

## 2022-01-02 DIAGNOSIS — R69 Illness, unspecified: Secondary | ICD-10-CM | POA: Diagnosis not present

## 2022-01-02 DIAGNOSIS — E785 Hyperlipidemia, unspecified: Secondary | ICD-10-CM | POA: Diagnosis not present

## 2022-01-02 DIAGNOSIS — D649 Anemia, unspecified: Secondary | ICD-10-CM | POA: Diagnosis not present

## 2022-01-02 DIAGNOSIS — K767 Hepatorenal syndrome: Secondary | ICD-10-CM | POA: Diagnosis not present

## 2022-01-02 DIAGNOSIS — D689 Coagulation defect, unspecified: Secondary | ICD-10-CM | POA: Diagnosis not present

## 2022-01-03 DIAGNOSIS — F10188 Alcohol abuse with other alcohol-induced disorder: Secondary | ICD-10-CM | POA: Diagnosis not present

## 2022-01-03 DIAGNOSIS — E785 Hyperlipidemia, unspecified: Secondary | ICD-10-CM | POA: Diagnosis not present

## 2022-01-03 DIAGNOSIS — K7010 Alcoholic hepatitis without ascites: Secondary | ICD-10-CM | POA: Diagnosis not present

## 2022-01-03 DIAGNOSIS — Z8616 Personal history of COVID-19: Secondary | ICD-10-CM | POA: Diagnosis not present

## 2022-01-03 DIAGNOSIS — D689 Coagulation defect, unspecified: Secondary | ICD-10-CM | POA: Diagnosis not present

## 2022-01-03 DIAGNOSIS — K767 Hepatorenal syndrome: Secondary | ICD-10-CM | POA: Diagnosis not present

## 2022-01-03 DIAGNOSIS — D649 Anemia, unspecified: Secondary | ICD-10-CM | POA: Diagnosis not present

## 2022-01-03 DIAGNOSIS — K2980 Duodenitis without bleeding: Secondary | ICD-10-CM | POA: Diagnosis not present

## 2022-01-03 DIAGNOSIS — K219 Gastro-esophageal reflux disease without esophagitis: Secondary | ICD-10-CM | POA: Diagnosis not present

## 2022-01-03 DIAGNOSIS — R69 Illness, unspecified: Secondary | ICD-10-CM | POA: Diagnosis not present

## 2022-01-03 DIAGNOSIS — K319 Disease of stomach and duodenum, unspecified: Secondary | ICD-10-CM | POA: Diagnosis not present

## 2022-01-04 DIAGNOSIS — F10188 Alcohol abuse with other alcohol-induced disorder: Secondary | ICD-10-CM | POA: Diagnosis not present

## 2022-01-04 DIAGNOSIS — K7010 Alcoholic hepatitis without ascites: Secondary | ICD-10-CM | POA: Diagnosis not present

## 2022-01-04 DIAGNOSIS — K319 Disease of stomach and duodenum, unspecified: Secondary | ICD-10-CM | POA: Diagnosis not present

## 2022-01-04 DIAGNOSIS — K767 Hepatorenal syndrome: Secondary | ICD-10-CM | POA: Diagnosis not present

## 2022-01-04 DIAGNOSIS — D689 Coagulation defect, unspecified: Secondary | ICD-10-CM | POA: Diagnosis not present

## 2022-01-04 DIAGNOSIS — Z8616 Personal history of COVID-19: Secondary | ICD-10-CM | POA: Diagnosis not present

## 2022-01-04 DIAGNOSIS — D649 Anemia, unspecified: Secondary | ICD-10-CM | POA: Diagnosis not present

## 2022-01-04 DIAGNOSIS — K2980 Duodenitis without bleeding: Secondary | ICD-10-CM | POA: Diagnosis not present

## 2022-01-04 DIAGNOSIS — K219 Gastro-esophageal reflux disease without esophagitis: Secondary | ICD-10-CM | POA: Diagnosis not present

## 2022-01-04 DIAGNOSIS — R69 Illness, unspecified: Secondary | ICD-10-CM | POA: Diagnosis not present

## 2022-01-04 DIAGNOSIS — E785 Hyperlipidemia, unspecified: Secondary | ICD-10-CM | POA: Diagnosis not present

## 2022-01-05 DIAGNOSIS — K7010 Alcoholic hepatitis without ascites: Secondary | ICD-10-CM | POA: Diagnosis not present

## 2022-01-05 DIAGNOSIS — D689 Coagulation defect, unspecified: Secondary | ICD-10-CM | POA: Diagnosis not present

## 2022-01-05 DIAGNOSIS — E785 Hyperlipidemia, unspecified: Secondary | ICD-10-CM | POA: Diagnosis not present

## 2022-01-05 DIAGNOSIS — F10188 Alcohol abuse with other alcohol-induced disorder: Secondary | ICD-10-CM | POA: Diagnosis not present

## 2022-01-05 DIAGNOSIS — K319 Disease of stomach and duodenum, unspecified: Secondary | ICD-10-CM | POA: Diagnosis not present

## 2022-01-05 DIAGNOSIS — K219 Gastro-esophageal reflux disease without esophagitis: Secondary | ICD-10-CM | POA: Diagnosis not present

## 2022-01-05 DIAGNOSIS — K2980 Duodenitis without bleeding: Secondary | ICD-10-CM | POA: Diagnosis not present

## 2022-01-05 DIAGNOSIS — K767 Hepatorenal syndrome: Secondary | ICD-10-CM | POA: Diagnosis not present

## 2022-01-05 DIAGNOSIS — Z8616 Personal history of COVID-19: Secondary | ICD-10-CM | POA: Diagnosis not present

## 2022-01-05 DIAGNOSIS — R69 Illness, unspecified: Secondary | ICD-10-CM | POA: Diagnosis not present

## 2022-01-05 DIAGNOSIS — D649 Anemia, unspecified: Secondary | ICD-10-CM | POA: Diagnosis not present

## 2022-01-06 DIAGNOSIS — D689 Coagulation defect, unspecified: Secondary | ICD-10-CM | POA: Diagnosis not present

## 2022-01-06 DIAGNOSIS — F10188 Alcohol abuse with other alcohol-induced disorder: Secondary | ICD-10-CM | POA: Diagnosis not present

## 2022-01-06 DIAGNOSIS — E785 Hyperlipidemia, unspecified: Secondary | ICD-10-CM | POA: Diagnosis not present

## 2022-01-06 DIAGNOSIS — K767 Hepatorenal syndrome: Secondary | ICD-10-CM | POA: Diagnosis not present

## 2022-01-06 DIAGNOSIS — K219 Gastro-esophageal reflux disease without esophagitis: Secondary | ICD-10-CM | POA: Diagnosis not present

## 2022-01-06 DIAGNOSIS — K319 Disease of stomach and duodenum, unspecified: Secondary | ICD-10-CM | POA: Diagnosis not present

## 2022-01-06 DIAGNOSIS — K7010 Alcoholic hepatitis without ascites: Secondary | ICD-10-CM | POA: Diagnosis not present

## 2022-01-06 DIAGNOSIS — R69 Illness, unspecified: Secondary | ICD-10-CM | POA: Diagnosis not present

## 2022-01-06 DIAGNOSIS — Z8616 Personal history of COVID-19: Secondary | ICD-10-CM | POA: Diagnosis not present

## 2022-01-06 DIAGNOSIS — K2980 Duodenitis without bleeding: Secondary | ICD-10-CM | POA: Diagnosis not present

## 2022-01-06 DIAGNOSIS — D649 Anemia, unspecified: Secondary | ICD-10-CM | POA: Diagnosis not present

## 2022-01-07 DIAGNOSIS — K2980 Duodenitis without bleeding: Secondary | ICD-10-CM | POA: Diagnosis not present

## 2022-01-07 DIAGNOSIS — K767 Hepatorenal syndrome: Secondary | ICD-10-CM | POA: Diagnosis not present

## 2022-01-07 DIAGNOSIS — D649 Anemia, unspecified: Secondary | ICD-10-CM | POA: Diagnosis not present

## 2022-01-07 DIAGNOSIS — K219 Gastro-esophageal reflux disease without esophagitis: Secondary | ICD-10-CM | POA: Diagnosis not present

## 2022-01-07 DIAGNOSIS — Z8616 Personal history of COVID-19: Secondary | ICD-10-CM | POA: Diagnosis not present

## 2022-01-07 DIAGNOSIS — K319 Disease of stomach and duodenum, unspecified: Secondary | ICD-10-CM | POA: Diagnosis not present

## 2022-01-07 DIAGNOSIS — K7010 Alcoholic hepatitis without ascites: Secondary | ICD-10-CM | POA: Diagnosis not present

## 2022-01-07 DIAGNOSIS — R69 Illness, unspecified: Secondary | ICD-10-CM | POA: Diagnosis not present

## 2022-01-07 DIAGNOSIS — F10188 Alcohol abuse with other alcohol-induced disorder: Secondary | ICD-10-CM | POA: Diagnosis not present

## 2022-01-07 DIAGNOSIS — D689 Coagulation defect, unspecified: Secondary | ICD-10-CM | POA: Diagnosis not present

## 2022-01-07 DIAGNOSIS — E785 Hyperlipidemia, unspecified: Secondary | ICD-10-CM | POA: Diagnosis not present

## 2022-01-08 DIAGNOSIS — K7010 Alcoholic hepatitis without ascites: Secondary | ICD-10-CM | POA: Diagnosis not present

## 2022-01-08 DIAGNOSIS — K767 Hepatorenal syndrome: Secondary | ICD-10-CM | POA: Diagnosis not present

## 2022-01-08 DIAGNOSIS — K219 Gastro-esophageal reflux disease without esophagitis: Secondary | ICD-10-CM | POA: Diagnosis not present

## 2022-01-08 DIAGNOSIS — R69 Illness, unspecified: Secondary | ICD-10-CM | POA: Diagnosis not present

## 2022-01-08 DIAGNOSIS — D689 Coagulation defect, unspecified: Secondary | ICD-10-CM | POA: Diagnosis not present

## 2022-01-08 DIAGNOSIS — K2980 Duodenitis without bleeding: Secondary | ICD-10-CM | POA: Diagnosis not present

## 2022-01-08 DIAGNOSIS — D649 Anemia, unspecified: Secondary | ICD-10-CM | POA: Diagnosis not present

## 2022-01-08 DIAGNOSIS — K319 Disease of stomach and duodenum, unspecified: Secondary | ICD-10-CM | POA: Diagnosis not present

## 2022-01-08 DIAGNOSIS — Z8616 Personal history of COVID-19: Secondary | ICD-10-CM | POA: Diagnosis not present

## 2022-01-08 DIAGNOSIS — F10188 Alcohol abuse with other alcohol-induced disorder: Secondary | ICD-10-CM | POA: Diagnosis not present

## 2022-01-08 DIAGNOSIS — E785 Hyperlipidemia, unspecified: Secondary | ICD-10-CM | POA: Diagnosis not present

## 2022-01-09 DIAGNOSIS — K319 Disease of stomach and duodenum, unspecified: Secondary | ICD-10-CM | POA: Diagnosis not present

## 2022-01-09 DIAGNOSIS — E785 Hyperlipidemia, unspecified: Secondary | ICD-10-CM | POA: Diagnosis not present

## 2022-01-09 DIAGNOSIS — F10188 Alcohol abuse with other alcohol-induced disorder: Secondary | ICD-10-CM | POA: Diagnosis not present

## 2022-01-09 DIAGNOSIS — K219 Gastro-esophageal reflux disease without esophagitis: Secondary | ICD-10-CM | POA: Diagnosis not present

## 2022-01-09 DIAGNOSIS — D649 Anemia, unspecified: Secondary | ICD-10-CM | POA: Diagnosis not present

## 2022-01-09 DIAGNOSIS — K767 Hepatorenal syndrome: Secondary | ICD-10-CM | POA: Diagnosis not present

## 2022-01-09 DIAGNOSIS — K7010 Alcoholic hepatitis without ascites: Secondary | ICD-10-CM | POA: Diagnosis not present

## 2022-01-09 DIAGNOSIS — R69 Illness, unspecified: Secondary | ICD-10-CM | POA: Diagnosis not present

## 2022-01-09 DIAGNOSIS — Z8616 Personal history of COVID-19: Secondary | ICD-10-CM | POA: Diagnosis not present

## 2022-01-09 DIAGNOSIS — K2980 Duodenitis without bleeding: Secondary | ICD-10-CM | POA: Diagnosis not present

## 2022-01-09 DIAGNOSIS — D689 Coagulation defect, unspecified: Secondary | ICD-10-CM | POA: Diagnosis not present

## 2022-01-10 DIAGNOSIS — Z8616 Personal history of COVID-19: Secondary | ICD-10-CM | POA: Diagnosis not present

## 2022-01-10 DIAGNOSIS — K7010 Alcoholic hepatitis without ascites: Secondary | ICD-10-CM | POA: Diagnosis not present

## 2022-01-10 DIAGNOSIS — D649 Anemia, unspecified: Secondary | ICD-10-CM | POA: Diagnosis not present

## 2022-01-10 DIAGNOSIS — E785 Hyperlipidemia, unspecified: Secondary | ICD-10-CM | POA: Diagnosis not present

## 2022-01-10 DIAGNOSIS — K2980 Duodenitis without bleeding: Secondary | ICD-10-CM | POA: Diagnosis not present

## 2022-01-10 DIAGNOSIS — F10188 Alcohol abuse with other alcohol-induced disorder: Secondary | ICD-10-CM | POA: Diagnosis not present

## 2022-01-10 DIAGNOSIS — K219 Gastro-esophageal reflux disease without esophagitis: Secondary | ICD-10-CM | POA: Diagnosis not present

## 2022-01-10 DIAGNOSIS — K767 Hepatorenal syndrome: Secondary | ICD-10-CM | POA: Diagnosis not present

## 2022-01-10 DIAGNOSIS — K319 Disease of stomach and duodenum, unspecified: Secondary | ICD-10-CM | POA: Diagnosis not present

## 2022-01-10 DIAGNOSIS — R69 Illness, unspecified: Secondary | ICD-10-CM | POA: Diagnosis not present

## 2022-01-10 DIAGNOSIS — D689 Coagulation defect, unspecified: Secondary | ICD-10-CM | POA: Diagnosis not present

## 2022-01-11 DIAGNOSIS — K319 Disease of stomach and duodenum, unspecified: Secondary | ICD-10-CM | POA: Diagnosis not present

## 2022-01-11 DIAGNOSIS — F10188 Alcohol abuse with other alcohol-induced disorder: Secondary | ICD-10-CM | POA: Diagnosis not present

## 2022-01-11 DIAGNOSIS — K767 Hepatorenal syndrome: Secondary | ICD-10-CM | POA: Diagnosis not present

## 2022-01-11 DIAGNOSIS — E785 Hyperlipidemia, unspecified: Secondary | ICD-10-CM | POA: Diagnosis not present

## 2022-01-11 DIAGNOSIS — K219 Gastro-esophageal reflux disease without esophagitis: Secondary | ICD-10-CM | POA: Diagnosis not present

## 2022-01-11 DIAGNOSIS — Z8616 Personal history of COVID-19: Secondary | ICD-10-CM | POA: Diagnosis not present

## 2022-01-11 DIAGNOSIS — D689 Coagulation defect, unspecified: Secondary | ICD-10-CM | POA: Diagnosis not present

## 2022-01-11 DIAGNOSIS — K2980 Duodenitis without bleeding: Secondary | ICD-10-CM | POA: Diagnosis not present

## 2022-01-11 DIAGNOSIS — K7010 Alcoholic hepatitis without ascites: Secondary | ICD-10-CM | POA: Diagnosis not present

## 2022-01-11 DIAGNOSIS — D649 Anemia, unspecified: Secondary | ICD-10-CM | POA: Diagnosis not present

## 2022-01-11 DIAGNOSIS — R69 Illness, unspecified: Secondary | ICD-10-CM | POA: Diagnosis not present

## 2022-01-12 DIAGNOSIS — F10188 Alcohol abuse with other alcohol-induced disorder: Secondary | ICD-10-CM | POA: Diagnosis not present

## 2022-01-12 DIAGNOSIS — R69 Illness, unspecified: Secondary | ICD-10-CM | POA: Diagnosis not present

## 2022-01-12 DIAGNOSIS — K7010 Alcoholic hepatitis without ascites: Secondary | ICD-10-CM | POA: Diagnosis not present

## 2022-01-12 DIAGNOSIS — K319 Disease of stomach and duodenum, unspecified: Secondary | ICD-10-CM | POA: Diagnosis not present

## 2022-01-12 DIAGNOSIS — E785 Hyperlipidemia, unspecified: Secondary | ICD-10-CM | POA: Diagnosis not present

## 2022-01-12 DIAGNOSIS — K219 Gastro-esophageal reflux disease without esophagitis: Secondary | ICD-10-CM | POA: Diagnosis not present

## 2022-01-12 DIAGNOSIS — Z8616 Personal history of COVID-19: Secondary | ICD-10-CM | POA: Diagnosis not present

## 2022-01-12 DIAGNOSIS — D649 Anemia, unspecified: Secondary | ICD-10-CM | POA: Diagnosis not present

## 2022-01-12 DIAGNOSIS — D689 Coagulation defect, unspecified: Secondary | ICD-10-CM | POA: Diagnosis not present

## 2022-01-12 DIAGNOSIS — K2980 Duodenitis without bleeding: Secondary | ICD-10-CM | POA: Diagnosis not present

## 2022-01-12 DIAGNOSIS — K767 Hepatorenal syndrome: Secondary | ICD-10-CM | POA: Diagnosis not present

## 2022-01-13 DIAGNOSIS — K219 Gastro-esophageal reflux disease without esophagitis: Secondary | ICD-10-CM | POA: Diagnosis not present

## 2022-01-13 DIAGNOSIS — K319 Disease of stomach and duodenum, unspecified: Secondary | ICD-10-CM | POA: Diagnosis not present

## 2022-01-13 DIAGNOSIS — E785 Hyperlipidemia, unspecified: Secondary | ICD-10-CM | POA: Diagnosis not present

## 2022-01-13 DIAGNOSIS — K2980 Duodenitis without bleeding: Secondary | ICD-10-CM | POA: Diagnosis not present

## 2022-01-13 DIAGNOSIS — R69 Illness, unspecified: Secondary | ICD-10-CM | POA: Diagnosis not present

## 2022-01-13 DIAGNOSIS — K7010 Alcoholic hepatitis without ascites: Secondary | ICD-10-CM | POA: Diagnosis not present

## 2022-01-13 DIAGNOSIS — D649 Anemia, unspecified: Secondary | ICD-10-CM | POA: Diagnosis not present

## 2022-01-13 DIAGNOSIS — F10188 Alcohol abuse with other alcohol-induced disorder: Secondary | ICD-10-CM | POA: Diagnosis not present

## 2022-01-13 DIAGNOSIS — D689 Coagulation defect, unspecified: Secondary | ICD-10-CM | POA: Diagnosis not present

## 2022-01-13 DIAGNOSIS — Z8616 Personal history of COVID-19: Secondary | ICD-10-CM | POA: Diagnosis not present

## 2022-01-13 DIAGNOSIS — K767 Hepatorenal syndrome: Secondary | ICD-10-CM | POA: Diagnosis not present

## 2022-01-14 DIAGNOSIS — E785 Hyperlipidemia, unspecified: Secondary | ICD-10-CM | POA: Diagnosis not present

## 2022-01-14 DIAGNOSIS — K219 Gastro-esophageal reflux disease without esophagitis: Secondary | ICD-10-CM | POA: Diagnosis not present

## 2022-01-14 DIAGNOSIS — K7010 Alcoholic hepatitis without ascites: Secondary | ICD-10-CM | POA: Diagnosis not present

## 2022-01-14 DIAGNOSIS — K767 Hepatorenal syndrome: Secondary | ICD-10-CM | POA: Diagnosis not present

## 2022-01-14 DIAGNOSIS — R69 Illness, unspecified: Secondary | ICD-10-CM | POA: Diagnosis not present

## 2022-01-14 DIAGNOSIS — F10188 Alcohol abuse with other alcohol-induced disorder: Secondary | ICD-10-CM | POA: Diagnosis not present

## 2022-01-14 DIAGNOSIS — K319 Disease of stomach and duodenum, unspecified: Secondary | ICD-10-CM | POA: Diagnosis not present

## 2022-01-14 DIAGNOSIS — D689 Coagulation defect, unspecified: Secondary | ICD-10-CM | POA: Diagnosis not present

## 2022-01-14 DIAGNOSIS — Z8616 Personal history of COVID-19: Secondary | ICD-10-CM | POA: Diagnosis not present

## 2022-01-14 DIAGNOSIS — D649 Anemia, unspecified: Secondary | ICD-10-CM | POA: Diagnosis not present

## 2022-01-14 DIAGNOSIS — K2980 Duodenitis without bleeding: Secondary | ICD-10-CM | POA: Diagnosis not present

## 2022-01-15 DIAGNOSIS — K7010 Alcoholic hepatitis without ascites: Secondary | ICD-10-CM | POA: Diagnosis not present

## 2022-01-15 DIAGNOSIS — Z8616 Personal history of COVID-19: Secondary | ICD-10-CM | POA: Diagnosis not present

## 2022-01-15 DIAGNOSIS — D689 Coagulation defect, unspecified: Secondary | ICD-10-CM | POA: Diagnosis not present

## 2022-01-15 DIAGNOSIS — E785 Hyperlipidemia, unspecified: Secondary | ICD-10-CM | POA: Diagnosis not present

## 2022-01-15 DIAGNOSIS — F10188 Alcohol abuse with other alcohol-induced disorder: Secondary | ICD-10-CM | POA: Diagnosis not present

## 2022-01-15 DIAGNOSIS — K219 Gastro-esophageal reflux disease without esophagitis: Secondary | ICD-10-CM | POA: Diagnosis not present

## 2022-01-15 DIAGNOSIS — R69 Illness, unspecified: Secondary | ICD-10-CM | POA: Diagnosis not present

## 2022-01-15 DIAGNOSIS — D649 Anemia, unspecified: Secondary | ICD-10-CM | POA: Diagnosis not present

## 2022-01-15 DIAGNOSIS — K319 Disease of stomach and duodenum, unspecified: Secondary | ICD-10-CM | POA: Diagnosis not present

## 2022-01-15 DIAGNOSIS — K767 Hepatorenal syndrome: Secondary | ICD-10-CM | POA: Diagnosis not present

## 2022-01-15 DIAGNOSIS — K2980 Duodenitis without bleeding: Secondary | ICD-10-CM | POA: Diagnosis not present

## 2022-01-16 DIAGNOSIS — K319 Disease of stomach and duodenum, unspecified: Secondary | ICD-10-CM | POA: Diagnosis not present

## 2022-01-16 DIAGNOSIS — E785 Hyperlipidemia, unspecified: Secondary | ICD-10-CM | POA: Diagnosis not present

## 2022-01-16 DIAGNOSIS — R69 Illness, unspecified: Secondary | ICD-10-CM | POA: Diagnosis not present

## 2022-01-16 DIAGNOSIS — Z8616 Personal history of COVID-19: Secondary | ICD-10-CM | POA: Diagnosis not present

## 2022-01-16 DIAGNOSIS — F10188 Alcohol abuse with other alcohol-induced disorder: Secondary | ICD-10-CM | POA: Diagnosis not present

## 2022-01-16 DIAGNOSIS — D689 Coagulation defect, unspecified: Secondary | ICD-10-CM | POA: Diagnosis not present

## 2022-01-16 DIAGNOSIS — K7010 Alcoholic hepatitis without ascites: Secondary | ICD-10-CM | POA: Diagnosis not present

## 2022-01-16 DIAGNOSIS — K2980 Duodenitis without bleeding: Secondary | ICD-10-CM | POA: Diagnosis not present

## 2022-01-16 DIAGNOSIS — D649 Anemia, unspecified: Secondary | ICD-10-CM | POA: Diagnosis not present

## 2022-01-16 DIAGNOSIS — K219 Gastro-esophageal reflux disease without esophagitis: Secondary | ICD-10-CM | POA: Diagnosis not present

## 2022-01-16 DIAGNOSIS — K767 Hepatorenal syndrome: Secondary | ICD-10-CM | POA: Diagnosis not present

## 2022-01-17 DIAGNOSIS — Z8616 Personal history of COVID-19: Secondary | ICD-10-CM | POA: Diagnosis not present

## 2022-01-17 DIAGNOSIS — K7010 Alcoholic hepatitis without ascites: Secondary | ICD-10-CM | POA: Diagnosis not present

## 2022-01-17 DIAGNOSIS — E785 Hyperlipidemia, unspecified: Secondary | ICD-10-CM | POA: Diagnosis not present

## 2022-01-17 DIAGNOSIS — K219 Gastro-esophageal reflux disease without esophagitis: Secondary | ICD-10-CM | POA: Diagnosis not present

## 2022-01-17 DIAGNOSIS — D649 Anemia, unspecified: Secondary | ICD-10-CM | POA: Diagnosis not present

## 2022-01-17 DIAGNOSIS — K767 Hepatorenal syndrome: Secondary | ICD-10-CM | POA: Diagnosis not present

## 2022-01-17 DIAGNOSIS — F10188 Alcohol abuse with other alcohol-induced disorder: Secondary | ICD-10-CM | POA: Diagnosis not present

## 2022-01-17 DIAGNOSIS — K319 Disease of stomach and duodenum, unspecified: Secondary | ICD-10-CM | POA: Diagnosis not present

## 2022-01-17 DIAGNOSIS — K2980 Duodenitis without bleeding: Secondary | ICD-10-CM | POA: Diagnosis not present

## 2022-01-17 DIAGNOSIS — R69 Illness, unspecified: Secondary | ICD-10-CM | POA: Diagnosis not present

## 2022-01-17 DIAGNOSIS — D689 Coagulation defect, unspecified: Secondary | ICD-10-CM | POA: Diagnosis not present

## 2022-01-18 DIAGNOSIS — D689 Coagulation defect, unspecified: Secondary | ICD-10-CM | POA: Diagnosis not present

## 2022-01-18 DIAGNOSIS — K2980 Duodenitis without bleeding: Secondary | ICD-10-CM | POA: Diagnosis not present

## 2022-01-18 DIAGNOSIS — E785 Hyperlipidemia, unspecified: Secondary | ICD-10-CM | POA: Diagnosis not present

## 2022-01-18 DIAGNOSIS — Z8616 Personal history of COVID-19: Secondary | ICD-10-CM | POA: Diagnosis not present

## 2022-01-18 DIAGNOSIS — R69 Illness, unspecified: Secondary | ICD-10-CM | POA: Diagnosis not present

## 2022-01-18 DIAGNOSIS — K767 Hepatorenal syndrome: Secondary | ICD-10-CM | POA: Diagnosis not present

## 2022-01-18 DIAGNOSIS — K7010 Alcoholic hepatitis without ascites: Secondary | ICD-10-CM | POA: Diagnosis not present

## 2022-01-18 DIAGNOSIS — K319 Disease of stomach and duodenum, unspecified: Secondary | ICD-10-CM | POA: Diagnosis not present

## 2022-01-18 DIAGNOSIS — K219 Gastro-esophageal reflux disease without esophagitis: Secondary | ICD-10-CM | POA: Diagnosis not present

## 2022-01-18 DIAGNOSIS — F10188 Alcohol abuse with other alcohol-induced disorder: Secondary | ICD-10-CM | POA: Diagnosis not present

## 2022-01-18 DIAGNOSIS — D649 Anemia, unspecified: Secondary | ICD-10-CM | POA: Diagnosis not present

## 2022-01-19 DIAGNOSIS — K219 Gastro-esophageal reflux disease without esophagitis: Secondary | ICD-10-CM | POA: Diagnosis not present

## 2022-01-19 DIAGNOSIS — Z8616 Personal history of COVID-19: Secondary | ICD-10-CM | POA: Diagnosis not present

## 2022-01-19 DIAGNOSIS — E785 Hyperlipidemia, unspecified: Secondary | ICD-10-CM | POA: Diagnosis not present

## 2022-01-19 DIAGNOSIS — D649 Anemia, unspecified: Secondary | ICD-10-CM | POA: Diagnosis not present

## 2022-01-19 DIAGNOSIS — D689 Coagulation defect, unspecified: Secondary | ICD-10-CM | POA: Diagnosis not present

## 2022-01-19 DIAGNOSIS — K319 Disease of stomach and duodenum, unspecified: Secondary | ICD-10-CM | POA: Diagnosis not present

## 2022-01-19 DIAGNOSIS — R69 Illness, unspecified: Secondary | ICD-10-CM | POA: Diagnosis not present

## 2022-01-19 DIAGNOSIS — K7010 Alcoholic hepatitis without ascites: Secondary | ICD-10-CM | POA: Diagnosis not present

## 2022-01-19 DIAGNOSIS — F10188 Alcohol abuse with other alcohol-induced disorder: Secondary | ICD-10-CM | POA: Diagnosis not present

## 2022-01-19 DIAGNOSIS — K767 Hepatorenal syndrome: Secondary | ICD-10-CM | POA: Diagnosis not present

## 2022-01-19 DIAGNOSIS — K2980 Duodenitis without bleeding: Secondary | ICD-10-CM | POA: Diagnosis not present

## 2022-01-20 DIAGNOSIS — F10188 Alcohol abuse with other alcohol-induced disorder: Secondary | ICD-10-CM | POA: Diagnosis not present

## 2022-01-20 DIAGNOSIS — E785 Hyperlipidemia, unspecified: Secondary | ICD-10-CM | POA: Diagnosis not present

## 2022-01-20 DIAGNOSIS — Z8616 Personal history of COVID-19: Secondary | ICD-10-CM | POA: Diagnosis not present

## 2022-01-20 DIAGNOSIS — D649 Anemia, unspecified: Secondary | ICD-10-CM | POA: Diagnosis not present

## 2022-01-20 DIAGNOSIS — K2980 Duodenitis without bleeding: Secondary | ICD-10-CM | POA: Diagnosis not present

## 2022-01-20 DIAGNOSIS — K319 Disease of stomach and duodenum, unspecified: Secondary | ICD-10-CM | POA: Diagnosis not present

## 2022-01-20 DIAGNOSIS — D689 Coagulation defect, unspecified: Secondary | ICD-10-CM | POA: Diagnosis not present

## 2022-01-20 DIAGNOSIS — K219 Gastro-esophageal reflux disease without esophagitis: Secondary | ICD-10-CM | POA: Diagnosis not present

## 2022-01-20 DIAGNOSIS — K767 Hepatorenal syndrome: Secondary | ICD-10-CM | POA: Diagnosis not present

## 2022-01-20 DIAGNOSIS — K7010 Alcoholic hepatitis without ascites: Secondary | ICD-10-CM | POA: Diagnosis not present

## 2022-01-20 DIAGNOSIS — R69 Illness, unspecified: Secondary | ICD-10-CM | POA: Diagnosis not present

## 2022-01-21 DIAGNOSIS — K219 Gastro-esophageal reflux disease without esophagitis: Secondary | ICD-10-CM | POA: Diagnosis not present

## 2022-01-21 DIAGNOSIS — E785 Hyperlipidemia, unspecified: Secondary | ICD-10-CM | POA: Diagnosis not present

## 2022-01-21 DIAGNOSIS — R69 Illness, unspecified: Secondary | ICD-10-CM | POA: Diagnosis not present

## 2022-01-21 DIAGNOSIS — K767 Hepatorenal syndrome: Secondary | ICD-10-CM | POA: Diagnosis not present

## 2022-01-21 DIAGNOSIS — F10188 Alcohol abuse with other alcohol-induced disorder: Secondary | ICD-10-CM | POA: Diagnosis not present

## 2022-01-21 DIAGNOSIS — K2980 Duodenitis without bleeding: Secondary | ICD-10-CM | POA: Diagnosis not present

## 2022-01-21 DIAGNOSIS — K319 Disease of stomach and duodenum, unspecified: Secondary | ICD-10-CM | POA: Diagnosis not present

## 2022-01-21 DIAGNOSIS — K7010 Alcoholic hepatitis without ascites: Secondary | ICD-10-CM | POA: Diagnosis not present

## 2022-01-21 DIAGNOSIS — Z8616 Personal history of COVID-19: Secondary | ICD-10-CM | POA: Diagnosis not present

## 2022-01-21 DIAGNOSIS — D649 Anemia, unspecified: Secondary | ICD-10-CM | POA: Diagnosis not present

## 2022-01-21 DIAGNOSIS — D689 Coagulation defect, unspecified: Secondary | ICD-10-CM | POA: Diagnosis not present

## 2022-01-22 DIAGNOSIS — K319 Disease of stomach and duodenum, unspecified: Secondary | ICD-10-CM | POA: Diagnosis not present

## 2022-01-22 DIAGNOSIS — K767 Hepatorenal syndrome: Secondary | ICD-10-CM | POA: Diagnosis not present

## 2022-01-22 DIAGNOSIS — K219 Gastro-esophageal reflux disease without esophagitis: Secondary | ICD-10-CM | POA: Diagnosis not present

## 2022-01-22 DIAGNOSIS — D649 Anemia, unspecified: Secondary | ICD-10-CM | POA: Diagnosis not present

## 2022-01-22 DIAGNOSIS — R69 Illness, unspecified: Secondary | ICD-10-CM | POA: Diagnosis not present

## 2022-01-22 DIAGNOSIS — Z8616 Personal history of COVID-19: Secondary | ICD-10-CM | POA: Diagnosis not present

## 2022-01-22 DIAGNOSIS — F10188 Alcohol abuse with other alcohol-induced disorder: Secondary | ICD-10-CM | POA: Diagnosis not present

## 2022-01-22 DIAGNOSIS — K2980 Duodenitis without bleeding: Secondary | ICD-10-CM | POA: Diagnosis not present

## 2022-01-22 DIAGNOSIS — K7010 Alcoholic hepatitis without ascites: Secondary | ICD-10-CM | POA: Diagnosis not present

## 2022-01-22 DIAGNOSIS — E785 Hyperlipidemia, unspecified: Secondary | ICD-10-CM | POA: Diagnosis not present

## 2022-01-22 DIAGNOSIS — D689 Coagulation defect, unspecified: Secondary | ICD-10-CM | POA: Diagnosis not present

## 2022-01-23 DIAGNOSIS — D689 Coagulation defect, unspecified: Secondary | ICD-10-CM | POA: Diagnosis not present

## 2022-01-23 DIAGNOSIS — K219 Gastro-esophageal reflux disease without esophagitis: Secondary | ICD-10-CM | POA: Diagnosis not present

## 2022-01-23 DIAGNOSIS — Z8616 Personal history of COVID-19: Secondary | ICD-10-CM | POA: Diagnosis not present

## 2022-01-23 DIAGNOSIS — F10188 Alcohol abuse with other alcohol-induced disorder: Secondary | ICD-10-CM | POA: Diagnosis not present

## 2022-01-23 DIAGNOSIS — D649 Anemia, unspecified: Secondary | ICD-10-CM | POA: Diagnosis not present

## 2022-01-23 DIAGNOSIS — K2980 Duodenitis without bleeding: Secondary | ICD-10-CM | POA: Diagnosis not present

## 2022-01-23 DIAGNOSIS — K319 Disease of stomach and duodenum, unspecified: Secondary | ICD-10-CM | POA: Diagnosis not present

## 2022-01-23 DIAGNOSIS — K767 Hepatorenal syndrome: Secondary | ICD-10-CM | POA: Diagnosis not present

## 2022-01-23 DIAGNOSIS — K7010 Alcoholic hepatitis without ascites: Secondary | ICD-10-CM | POA: Diagnosis not present

## 2022-01-23 DIAGNOSIS — E785 Hyperlipidemia, unspecified: Secondary | ICD-10-CM | POA: Diagnosis not present

## 2022-01-23 DIAGNOSIS — R69 Illness, unspecified: Secondary | ICD-10-CM | POA: Diagnosis not present

## 2022-01-24 DIAGNOSIS — Z8616 Personal history of COVID-19: Secondary | ICD-10-CM | POA: Diagnosis not present

## 2022-01-24 DIAGNOSIS — D689 Coagulation defect, unspecified: Secondary | ICD-10-CM | POA: Diagnosis not present

## 2022-01-24 DIAGNOSIS — K319 Disease of stomach and duodenum, unspecified: Secondary | ICD-10-CM | POA: Diagnosis not present

## 2022-01-24 DIAGNOSIS — K219 Gastro-esophageal reflux disease without esophagitis: Secondary | ICD-10-CM | POA: Diagnosis not present

## 2022-01-24 DIAGNOSIS — K7010 Alcoholic hepatitis without ascites: Secondary | ICD-10-CM | POA: Diagnosis not present

## 2022-01-24 DIAGNOSIS — R69 Illness, unspecified: Secondary | ICD-10-CM | POA: Diagnosis not present

## 2022-01-24 DIAGNOSIS — K767 Hepatorenal syndrome: Secondary | ICD-10-CM | POA: Diagnosis not present

## 2022-01-24 DIAGNOSIS — E785 Hyperlipidemia, unspecified: Secondary | ICD-10-CM | POA: Diagnosis not present

## 2022-01-24 DIAGNOSIS — F10188 Alcohol abuse with other alcohol-induced disorder: Secondary | ICD-10-CM | POA: Diagnosis not present

## 2022-01-24 DIAGNOSIS — K2980 Duodenitis without bleeding: Secondary | ICD-10-CM | POA: Diagnosis not present

## 2022-01-24 DIAGNOSIS — D649 Anemia, unspecified: Secondary | ICD-10-CM | POA: Diagnosis not present

## 2022-01-25 DIAGNOSIS — K7010 Alcoholic hepatitis without ascites: Secondary | ICD-10-CM | POA: Diagnosis not present

## 2022-01-25 DIAGNOSIS — K767 Hepatorenal syndrome: Secondary | ICD-10-CM | POA: Diagnosis not present

## 2022-01-25 DIAGNOSIS — R69 Illness, unspecified: Secondary | ICD-10-CM | POA: Diagnosis not present

## 2022-01-25 DIAGNOSIS — Z8616 Personal history of COVID-19: Secondary | ICD-10-CM | POA: Diagnosis not present

## 2022-01-25 DIAGNOSIS — F10188 Alcohol abuse with other alcohol-induced disorder: Secondary | ICD-10-CM | POA: Diagnosis not present

## 2022-01-25 DIAGNOSIS — D649 Anemia, unspecified: Secondary | ICD-10-CM | POA: Diagnosis not present

## 2022-01-25 DIAGNOSIS — D689 Coagulation defect, unspecified: Secondary | ICD-10-CM | POA: Diagnosis not present

## 2022-01-25 DIAGNOSIS — E785 Hyperlipidemia, unspecified: Secondary | ICD-10-CM | POA: Diagnosis not present

## 2022-01-25 DIAGNOSIS — K2980 Duodenitis without bleeding: Secondary | ICD-10-CM | POA: Diagnosis not present

## 2022-01-25 DIAGNOSIS — K319 Disease of stomach and duodenum, unspecified: Secondary | ICD-10-CM | POA: Diagnosis not present

## 2022-01-25 DIAGNOSIS — K219 Gastro-esophageal reflux disease without esophagitis: Secondary | ICD-10-CM | POA: Diagnosis not present

## 2022-01-26 DIAGNOSIS — D689 Coagulation defect, unspecified: Secondary | ICD-10-CM | POA: Diagnosis not present

## 2022-01-26 DIAGNOSIS — E785 Hyperlipidemia, unspecified: Secondary | ICD-10-CM | POA: Diagnosis not present

## 2022-01-26 DIAGNOSIS — K319 Disease of stomach and duodenum, unspecified: Secondary | ICD-10-CM | POA: Diagnosis not present

## 2022-01-26 DIAGNOSIS — Z8616 Personal history of COVID-19: Secondary | ICD-10-CM | POA: Diagnosis not present

## 2022-01-26 DIAGNOSIS — D649 Anemia, unspecified: Secondary | ICD-10-CM | POA: Diagnosis not present

## 2022-01-26 DIAGNOSIS — K219 Gastro-esophageal reflux disease without esophagitis: Secondary | ICD-10-CM | POA: Diagnosis not present

## 2022-01-26 DIAGNOSIS — R69 Illness, unspecified: Secondary | ICD-10-CM | POA: Diagnosis not present

## 2022-01-26 DIAGNOSIS — F10188 Alcohol abuse with other alcohol-induced disorder: Secondary | ICD-10-CM | POA: Diagnosis not present

## 2022-01-26 DIAGNOSIS — K2980 Duodenitis without bleeding: Secondary | ICD-10-CM | POA: Diagnosis not present

## 2022-01-26 DIAGNOSIS — K7010 Alcoholic hepatitis without ascites: Secondary | ICD-10-CM | POA: Diagnosis not present

## 2022-01-26 DIAGNOSIS — K767 Hepatorenal syndrome: Secondary | ICD-10-CM | POA: Diagnosis not present

## 2022-01-27 DIAGNOSIS — Z8616 Personal history of COVID-19: Secondary | ICD-10-CM | POA: Diagnosis not present

## 2022-01-27 DIAGNOSIS — D649 Anemia, unspecified: Secondary | ICD-10-CM | POA: Diagnosis not present

## 2022-01-27 DIAGNOSIS — K767 Hepatorenal syndrome: Secondary | ICD-10-CM | POA: Diagnosis not present

## 2022-01-27 DIAGNOSIS — E785 Hyperlipidemia, unspecified: Secondary | ICD-10-CM | POA: Diagnosis not present

## 2022-01-27 DIAGNOSIS — K319 Disease of stomach and duodenum, unspecified: Secondary | ICD-10-CM | POA: Diagnosis not present

## 2022-01-27 DIAGNOSIS — K219 Gastro-esophageal reflux disease without esophagitis: Secondary | ICD-10-CM | POA: Diagnosis not present

## 2022-01-27 DIAGNOSIS — F10188 Alcohol abuse with other alcohol-induced disorder: Secondary | ICD-10-CM | POA: Diagnosis not present

## 2022-01-27 DIAGNOSIS — K2980 Duodenitis without bleeding: Secondary | ICD-10-CM | POA: Diagnosis not present

## 2022-01-27 DIAGNOSIS — K7010 Alcoholic hepatitis without ascites: Secondary | ICD-10-CM | POA: Diagnosis not present

## 2022-01-27 DIAGNOSIS — R69 Illness, unspecified: Secondary | ICD-10-CM | POA: Diagnosis not present

## 2022-01-27 DIAGNOSIS — D689 Coagulation defect, unspecified: Secondary | ICD-10-CM | POA: Diagnosis not present

## 2022-01-28 DIAGNOSIS — R69 Illness, unspecified: Secondary | ICD-10-CM | POA: Diagnosis not present

## 2022-01-28 DIAGNOSIS — K767 Hepatorenal syndrome: Secondary | ICD-10-CM | POA: Diagnosis not present

## 2022-01-28 DIAGNOSIS — Z8616 Personal history of COVID-19: Secondary | ICD-10-CM | POA: Diagnosis not present

## 2022-01-28 DIAGNOSIS — K319 Disease of stomach and duodenum, unspecified: Secondary | ICD-10-CM | POA: Diagnosis not present

## 2022-01-28 DIAGNOSIS — K219 Gastro-esophageal reflux disease without esophagitis: Secondary | ICD-10-CM | POA: Diagnosis not present

## 2022-01-28 DIAGNOSIS — D689 Coagulation defect, unspecified: Secondary | ICD-10-CM | POA: Diagnosis not present

## 2022-01-28 DIAGNOSIS — K7010 Alcoholic hepatitis without ascites: Secondary | ICD-10-CM | POA: Diagnosis not present

## 2022-01-28 DIAGNOSIS — F10188 Alcohol abuse with other alcohol-induced disorder: Secondary | ICD-10-CM | POA: Diagnosis not present

## 2022-01-28 DIAGNOSIS — D649 Anemia, unspecified: Secondary | ICD-10-CM | POA: Diagnosis not present

## 2022-01-28 DIAGNOSIS — E785 Hyperlipidemia, unspecified: Secondary | ICD-10-CM | POA: Diagnosis not present

## 2022-01-28 DIAGNOSIS — K2980 Duodenitis without bleeding: Secondary | ICD-10-CM | POA: Diagnosis not present

## 2022-01-29 DIAGNOSIS — D689 Coagulation defect, unspecified: Secondary | ICD-10-CM | POA: Diagnosis not present

## 2022-01-29 DIAGNOSIS — F10188 Alcohol abuse with other alcohol-induced disorder: Secondary | ICD-10-CM | POA: Diagnosis not present

## 2022-01-29 DIAGNOSIS — K219 Gastro-esophageal reflux disease without esophagitis: Secondary | ICD-10-CM | POA: Diagnosis not present

## 2022-01-29 DIAGNOSIS — D649 Anemia, unspecified: Secondary | ICD-10-CM | POA: Diagnosis not present

## 2022-01-29 DIAGNOSIS — K2980 Duodenitis without bleeding: Secondary | ICD-10-CM | POA: Diagnosis not present

## 2022-01-29 DIAGNOSIS — E785 Hyperlipidemia, unspecified: Secondary | ICD-10-CM | POA: Diagnosis not present

## 2022-01-29 DIAGNOSIS — R69 Illness, unspecified: Secondary | ICD-10-CM | POA: Diagnosis not present

## 2022-01-29 DIAGNOSIS — K319 Disease of stomach and duodenum, unspecified: Secondary | ICD-10-CM | POA: Diagnosis not present

## 2022-01-29 DIAGNOSIS — K7010 Alcoholic hepatitis without ascites: Secondary | ICD-10-CM | POA: Diagnosis not present

## 2022-01-29 DIAGNOSIS — Z8616 Personal history of COVID-19: Secondary | ICD-10-CM | POA: Diagnosis not present

## 2022-01-29 DIAGNOSIS — K767 Hepatorenal syndrome: Secondary | ICD-10-CM | POA: Diagnosis not present

## 2022-01-30 DIAGNOSIS — D649 Anemia, unspecified: Secondary | ICD-10-CM | POA: Diagnosis not present

## 2022-01-30 DIAGNOSIS — Z8616 Personal history of COVID-19: Secondary | ICD-10-CM | POA: Diagnosis not present

## 2022-01-30 DIAGNOSIS — K2980 Duodenitis without bleeding: Secondary | ICD-10-CM | POA: Diagnosis not present

## 2022-01-30 DIAGNOSIS — K767 Hepatorenal syndrome: Secondary | ICD-10-CM | POA: Diagnosis not present

## 2022-01-30 DIAGNOSIS — K219 Gastro-esophageal reflux disease without esophagitis: Secondary | ICD-10-CM | POA: Diagnosis not present

## 2022-01-30 DIAGNOSIS — F10188 Alcohol abuse with other alcohol-induced disorder: Secondary | ICD-10-CM | POA: Diagnosis not present

## 2022-01-30 DIAGNOSIS — K319 Disease of stomach and duodenum, unspecified: Secondary | ICD-10-CM | POA: Diagnosis not present

## 2022-01-30 DIAGNOSIS — K7010 Alcoholic hepatitis without ascites: Secondary | ICD-10-CM | POA: Diagnosis not present

## 2022-01-30 DIAGNOSIS — E785 Hyperlipidemia, unspecified: Secondary | ICD-10-CM | POA: Diagnosis not present

## 2022-01-30 DIAGNOSIS — R69 Illness, unspecified: Secondary | ICD-10-CM | POA: Diagnosis not present

## 2022-01-30 DIAGNOSIS — D689 Coagulation defect, unspecified: Secondary | ICD-10-CM | POA: Diagnosis not present

## 2022-01-31 DIAGNOSIS — F10188 Alcohol abuse with other alcohol-induced disorder: Secondary | ICD-10-CM | POA: Diagnosis not present

## 2022-01-31 DIAGNOSIS — K219 Gastro-esophageal reflux disease without esophagitis: Secondary | ICD-10-CM | POA: Diagnosis not present

## 2022-01-31 DIAGNOSIS — R69 Illness, unspecified: Secondary | ICD-10-CM | POA: Diagnosis not present

## 2022-01-31 DIAGNOSIS — K767 Hepatorenal syndrome: Secondary | ICD-10-CM | POA: Diagnosis not present

## 2022-01-31 DIAGNOSIS — D689 Coagulation defect, unspecified: Secondary | ICD-10-CM | POA: Diagnosis not present

## 2022-01-31 DIAGNOSIS — E785 Hyperlipidemia, unspecified: Secondary | ICD-10-CM | POA: Diagnosis not present

## 2022-01-31 DIAGNOSIS — K7010 Alcoholic hepatitis without ascites: Secondary | ICD-10-CM | POA: Diagnosis not present

## 2022-01-31 DIAGNOSIS — D649 Anemia, unspecified: Secondary | ICD-10-CM | POA: Diagnosis not present

## 2022-01-31 DIAGNOSIS — K2980 Duodenitis without bleeding: Secondary | ICD-10-CM | POA: Diagnosis not present

## 2022-01-31 DIAGNOSIS — Z8616 Personal history of COVID-19: Secondary | ICD-10-CM | POA: Diagnosis not present

## 2022-01-31 DIAGNOSIS — K319 Disease of stomach and duodenum, unspecified: Secondary | ICD-10-CM | POA: Diagnosis not present

## 2022-02-01 DIAGNOSIS — K319 Disease of stomach and duodenum, unspecified: Secondary | ICD-10-CM | POA: Diagnosis not present

## 2022-02-01 DIAGNOSIS — R69 Illness, unspecified: Secondary | ICD-10-CM | POA: Diagnosis not present

## 2022-02-01 DIAGNOSIS — D689 Coagulation defect, unspecified: Secondary | ICD-10-CM | POA: Diagnosis not present

## 2022-02-01 DIAGNOSIS — K2980 Duodenitis without bleeding: Secondary | ICD-10-CM | POA: Diagnosis not present

## 2022-02-01 DIAGNOSIS — E785 Hyperlipidemia, unspecified: Secondary | ICD-10-CM | POA: Diagnosis not present

## 2022-02-01 DIAGNOSIS — D649 Anemia, unspecified: Secondary | ICD-10-CM | POA: Diagnosis not present

## 2022-02-01 DIAGNOSIS — F10188 Alcohol abuse with other alcohol-induced disorder: Secondary | ICD-10-CM | POA: Diagnosis not present

## 2022-02-01 DIAGNOSIS — K7010 Alcoholic hepatitis without ascites: Secondary | ICD-10-CM | POA: Diagnosis not present

## 2022-02-01 DIAGNOSIS — K767 Hepatorenal syndrome: Secondary | ICD-10-CM | POA: Diagnosis not present

## 2022-02-01 DIAGNOSIS — Z8616 Personal history of COVID-19: Secondary | ICD-10-CM | POA: Diagnosis not present

## 2022-02-01 DIAGNOSIS — K219 Gastro-esophageal reflux disease without esophagitis: Secondary | ICD-10-CM | POA: Diagnosis not present

## 2022-02-02 DIAGNOSIS — K767 Hepatorenal syndrome: Secondary | ICD-10-CM | POA: Diagnosis not present

## 2022-02-02 DIAGNOSIS — D689 Coagulation defect, unspecified: Secondary | ICD-10-CM | POA: Diagnosis not present

## 2022-02-02 DIAGNOSIS — F10188 Alcohol abuse with other alcohol-induced disorder: Secondary | ICD-10-CM | POA: Diagnosis not present

## 2022-02-02 DIAGNOSIS — K319 Disease of stomach and duodenum, unspecified: Secondary | ICD-10-CM | POA: Diagnosis not present

## 2022-02-02 DIAGNOSIS — K7010 Alcoholic hepatitis without ascites: Secondary | ICD-10-CM | POA: Diagnosis not present

## 2022-02-02 DIAGNOSIS — K219 Gastro-esophageal reflux disease without esophagitis: Secondary | ICD-10-CM | POA: Diagnosis not present

## 2022-02-02 DIAGNOSIS — K2980 Duodenitis without bleeding: Secondary | ICD-10-CM | POA: Diagnosis not present

## 2022-02-02 DIAGNOSIS — E785 Hyperlipidemia, unspecified: Secondary | ICD-10-CM | POA: Diagnosis not present

## 2022-02-02 DIAGNOSIS — D649 Anemia, unspecified: Secondary | ICD-10-CM | POA: Diagnosis not present

## 2022-02-02 DIAGNOSIS — R69 Illness, unspecified: Secondary | ICD-10-CM | POA: Diagnosis not present

## 2022-02-02 DIAGNOSIS — Z8616 Personal history of COVID-19: Secondary | ICD-10-CM | POA: Diagnosis not present

## 2022-02-03 DIAGNOSIS — E785 Hyperlipidemia, unspecified: Secondary | ICD-10-CM | POA: Diagnosis not present

## 2022-02-03 DIAGNOSIS — K7010 Alcoholic hepatitis without ascites: Secondary | ICD-10-CM | POA: Diagnosis not present

## 2022-02-03 DIAGNOSIS — R69 Illness, unspecified: Secondary | ICD-10-CM | POA: Diagnosis not present

## 2022-02-03 DIAGNOSIS — K219 Gastro-esophageal reflux disease without esophagitis: Secondary | ICD-10-CM | POA: Diagnosis not present

## 2022-02-03 DIAGNOSIS — K767 Hepatorenal syndrome: Secondary | ICD-10-CM | POA: Diagnosis not present

## 2022-02-03 DIAGNOSIS — F10188 Alcohol abuse with other alcohol-induced disorder: Secondary | ICD-10-CM | POA: Diagnosis not present

## 2022-02-03 DIAGNOSIS — K2980 Duodenitis without bleeding: Secondary | ICD-10-CM | POA: Diagnosis not present

## 2022-02-03 DIAGNOSIS — D649 Anemia, unspecified: Secondary | ICD-10-CM | POA: Diagnosis not present

## 2022-02-03 DIAGNOSIS — K319 Disease of stomach and duodenum, unspecified: Secondary | ICD-10-CM | POA: Diagnosis not present

## 2022-02-03 DIAGNOSIS — D689 Coagulation defect, unspecified: Secondary | ICD-10-CM | POA: Diagnosis not present

## 2022-02-03 DIAGNOSIS — Z8616 Personal history of COVID-19: Secondary | ICD-10-CM | POA: Diagnosis not present

## 2022-02-04 DIAGNOSIS — E785 Hyperlipidemia, unspecified: Secondary | ICD-10-CM | POA: Diagnosis not present

## 2022-02-04 DIAGNOSIS — F10188 Alcohol abuse with other alcohol-induced disorder: Secondary | ICD-10-CM | POA: Diagnosis not present

## 2022-02-04 DIAGNOSIS — K219 Gastro-esophageal reflux disease without esophagitis: Secondary | ICD-10-CM | POA: Diagnosis not present

## 2022-02-04 DIAGNOSIS — Z8616 Personal history of COVID-19: Secondary | ICD-10-CM | POA: Diagnosis not present

## 2022-02-04 DIAGNOSIS — D649 Anemia, unspecified: Secondary | ICD-10-CM | POA: Diagnosis not present

## 2022-02-04 DIAGNOSIS — K7010 Alcoholic hepatitis without ascites: Secondary | ICD-10-CM | POA: Diagnosis not present

## 2022-02-04 DIAGNOSIS — D689 Coagulation defect, unspecified: Secondary | ICD-10-CM | POA: Diagnosis not present

## 2022-02-04 DIAGNOSIS — R69 Illness, unspecified: Secondary | ICD-10-CM | POA: Diagnosis not present

## 2022-02-04 DIAGNOSIS — K319 Disease of stomach and duodenum, unspecified: Secondary | ICD-10-CM | POA: Diagnosis not present

## 2022-02-04 DIAGNOSIS — K767 Hepatorenal syndrome: Secondary | ICD-10-CM | POA: Diagnosis not present

## 2022-02-04 DIAGNOSIS — K2980 Duodenitis without bleeding: Secondary | ICD-10-CM | POA: Diagnosis not present

## 2022-02-05 ENCOUNTER — Encounter: Payer: Self-pay | Admitting: Family Medicine

## 2022-02-05 ENCOUNTER — Ambulatory Visit: Payer: No Typology Code available for payment source | Admitting: Family Medicine

## 2022-02-05 VITALS — BP 120/72 | HR 102 | Ht 68.0 in | Wt 154.0 lb

## 2022-02-05 DIAGNOSIS — F10188 Alcohol abuse with other alcohol-induced disorder: Secondary | ICD-10-CM | POA: Diagnosis not present

## 2022-02-05 DIAGNOSIS — K7030 Alcoholic cirrhosis of liver without ascites: Secondary | ICD-10-CM | POA: Diagnosis not present

## 2022-02-05 DIAGNOSIS — K319 Disease of stomach and duodenum, unspecified: Secondary | ICD-10-CM | POA: Diagnosis not present

## 2022-02-05 DIAGNOSIS — E785 Hyperlipidemia, unspecified: Secondary | ICD-10-CM | POA: Diagnosis not present

## 2022-02-05 DIAGNOSIS — Z8616 Personal history of COVID-19: Secondary | ICD-10-CM | POA: Diagnosis not present

## 2022-02-05 DIAGNOSIS — K2980 Duodenitis without bleeding: Secondary | ICD-10-CM | POA: Diagnosis not present

## 2022-02-05 DIAGNOSIS — R252 Cramp and spasm: Secondary | ICD-10-CM | POA: Diagnosis not present

## 2022-02-05 DIAGNOSIS — R69 Illness, unspecified: Secondary | ICD-10-CM | POA: Diagnosis not present

## 2022-02-05 DIAGNOSIS — K767 Hepatorenal syndrome: Secondary | ICD-10-CM | POA: Diagnosis not present

## 2022-02-05 DIAGNOSIS — D649 Anemia, unspecified: Secondary | ICD-10-CM | POA: Diagnosis not present

## 2022-02-05 DIAGNOSIS — K7010 Alcoholic hepatitis without ascites: Secondary | ICD-10-CM | POA: Diagnosis not present

## 2022-02-05 DIAGNOSIS — D689 Coagulation defect, unspecified: Secondary | ICD-10-CM | POA: Diagnosis not present

## 2022-02-05 DIAGNOSIS — K219 Gastro-esophageal reflux disease without esophagitis: Secondary | ICD-10-CM | POA: Diagnosis not present

## 2022-02-05 MED ORDER — DRONABINOL 2.5 MG PO CAPS: 2.5000 mg | ORAL_CAPSULE | Freq: Two times a day (BID) | ORAL | 3 refills | Status: DC

## 2022-02-05 MED ORDER — VALACYCLOVIR HCL 1 G PO TABS: 1000.0000 mg | ORAL_TABLET | Freq: Three times a day (TID) | ORAL | 0 refills | Status: DC

## 2022-02-05 MED ORDER — PREDNISONE 20 MG PO TABS: ORAL_TABLET | ORAL | 0 refills | Status: DC

## 2022-02-05 NOTE — Progress Notes (Signed)
Subjective:    Patient ID: Christopher Carrillo, male    DOB: 09-27-1970, 51 y.o.   MRN: [id number]  HPI 10/31/21 In February, he was on hospice due to liver failure from alcoholic cirrhosis.  Patient had remain abstinent from alcohol for 4 months but had a relapse in May where he started drinking beer.  Around the same time his marriage dissolved.  He is separated from his wife and he states that there is no hope for reconciliation.  However the patient states that she was bringing alcohol home for him and that he asked her to stop which triggered her to leave him.  I apologized to the patient.  This seems like a very unhealthy relationship if this was in fact occurring.  He is not receiving any formal alcohol treatment program but he states that he stopped drinking 2 months ago.  However his color has worsened.  He is jaundiced today on exam.  There is scleral icterus.  He is extremely tearful and appears depressed.  However he refuses any medicine for depression as he experienced suicidal thoughts on Lexapro in the past.  He does report trouble sleeping.  He has been trying melatonin states that that is not helping.  At that time, my plan was: No diagnosis found. Patient seems very sad and depressed today however he declines any medication for depression.  He also declines any referral for counseling.  He has been sober now for 2 months.  I am concerned about his overall clinical appearance which has deteriorated since April.  I will check a CBC a CMP and PT and INR.  I have recommended that the patient increase his Lasix.  He states that he is taking 20 mg a day.  I recommended increasing that to 40 mg a day given the swelling in his legs.  I will check his potassium level to determine if he needs additional potassium.  02/05/22 Wt Readings from Last 3 Encounters:  02/05/22 154 lb (69.9 kg)  10/31/21 172 lb (78 kg)  08/08/21 180 lb (81.6 kg)   Patient has been abstinent from alcohol since I last saw  him.  He states that he is doing well and denies any depression.  However he is lost almost 20 pounds since I last saw him.  He is still taking Lasix 40 mg a day as well as spironolactone.  He reports a dull constant headache.  He reports dizziness.  He reports dry mouth.  He reports cramping.  All of his symptoms sound consistent with dehydration.  He denies any vertigo.  He denies any otalgia.  He denies any chest pain palpitations or shortness of breath. Past Medical History:  Diagnosis Date   Abdominal pain    Alcoholic hepatitis without ascites    Anemia    Anxiety    Arthritis    Elevated bilirubin    Essential hypertension    Psoriasis    Transaminitis    Past Surgical History:  Procedure Laterality Date   BIOPSY  07/20/2019   Procedure: BIOPSY;  Surgeon: Daneil Dolin, MD;  [facility];  Service: Endoscopy;;   BIOPSY  08/14/2019   Procedure: BIOPSY;  Surgeon: Thornton Park, MD;  Location: [place];  Service: Gastroenterology;;   COLONOSCOPY     COLONOSCOPY WITH PROPOFOL N/A 07/20/2019   Procedure: COLONOSCOPY WITH PROPOFOL;  Surgeon: Daneil Dolin, MD;  [facility];  Service: Endoscopy;  Laterality: N/A;  10:15am   COLONOSCOPY WITH  PROPOFOL N/A 08/14/2019   Procedure: COLONOSCOPY WITH PROPOFOL;  Surgeon: Thornton Park, MD;  Location: [place];  Service: Gastroenterology;  Laterality: N/A;   IR PARACENTESIS  08/14/2019   IR PARACENTESIS  05/02/2021   POLYPECTOMY  07/20/2019   Procedure: POLYPECTOMY;  Surgeon: Daneil Dolin, MD;  [facility];  Service: Endoscopy;;   SUBMUCOSAL TATTOO INJECTION  08/14/2019   Procedure: SUBMUCOSAL TATTOO INJECTION;  Surgeon: Thornton Park, MD;  Location: [place];  Service: Gastroenterology;;   Current Outpatient Medications on File Prior to Visit  Medication Sig Dispense Refill   feeding supplement (ENSURE ENLIVE / ENSURE PLUS) LIQD Take 237 mLs by mouth 3 (three) times daily  between meals. 237 mL 12   furosemide (LASIX) 40 MG tablet Take 1 tablet (40 mg total) by mouth daily. 30 tablet 3   hydrOXYzine (VISTARIL) 25 MG capsule Take 1 capsule (25 mg total) by mouth at bedtime as needed (sleep). 30 capsule 0   lactulose (CHRONULAC) 10 GM/15ML solution Take 30 mLs (20 g total) by mouth daily. 473 mL 2   potassium chloride SA (KLOR-CON M) 20 MEQ tablet TAKE 1 TABLET BY MOUTH EVERY DAY 30 tablet 0   spironolactone (ALDACTONE) 25 MG tablet Take 1 tablet (25 mg total) by mouth daily. 30 tablet 5   traMADol (ULTRAM) 50 MG tablet TAKE 1 TABLET(50 MG) BY MOUTH EVERY 6 HOURS AS NEEDED 30 tablet 0   No current facility-administered medications on file prior to visit.   Allergies  Allergen Reactions   Oxycodone Itching   Social History   Socioeconomic History   Marital status: Married    Spouse name: Not on file   Number of children: Not on file   Years of education: Not on file   Highest education level: Not on file  Occupational History   Not on file  Tobacco Use   Smoking status: Every Day    Packs/day: 0.33    Types: Cigarettes   Smokeless tobacco: Never  Vaping Use   Vaping Use: Never used  Substance and Sexual Activity   Alcohol use: Yes    Alcohol/week: 1.0 standard drink of alcohol    Types: 1 Cans of beer per week    Comment: occasionally   Drug use: No   Sexual activity: Yes  Other Topics Concern   Not on file  Social History Narrative   Not on file   Social Determinants of Health   Financial Resource Strain: Not on file  Food Insecurity: Not on file  Transportation Needs: Not on file  Physical Activity: Not on file  Stress: Not on file  Social Connections: Not on file  Intimate Partner Violence: Not on file     Review of Systems     Objective:   Physical Exam Vitals reviewed.  Constitutional:      General: He is not in acute distress.    Appearance: Normal appearance. He is normal weight. He is not ill-appearing,  toxic-appearing or diaphoretic.  HENT:     Mouth/Throat:     Mouth: Mucous membranes are dry.  Eyes:     General: No scleral icterus. Cardiovascular:     Rate and Rhythm: Normal rate and regular rhythm.     Heart sounds: Normal heart sounds. No murmur heard.    No friction rub. No gallop.  Pulmonary:     Effort: Pulmonary effort is normal. No respiratory distress.     Breath sounds: Normal breath sounds. No stridor. No wheezing,  rhonchi or rales.  Abdominal:     General: Abdomen is flat. Bowel sounds are normal. There is no distension.     Palpations: Abdomen is soft.     Tenderness: There is no abdominal tenderness. There is no guarding or rebound.  Musculoskeletal:     Right lower leg: No edema.     Left lower leg: No edema.  Skin:    Coloration: Skin is not jaundiced or pale.  Neurological:     General: No focal deficit present.     Mental Status: He is alert and oriented to person, place, and time. Mental status is at baseline.           Assessment & Plan:  Cramps, extremity - Plan: CBC with Differential/Platelet, COMPLETE METABOLIC PANEL WITH GFR  Alcoholic cirrhosis of liver without ascites ([facility]) Looks dehydrated, hold lasix and check bmp.

## 2022-02-06 DIAGNOSIS — F10188 Alcohol abuse with other alcohol-induced disorder: Secondary | ICD-10-CM | POA: Diagnosis not present

## 2022-02-06 DIAGNOSIS — K219 Gastro-esophageal reflux disease without esophagitis: Secondary | ICD-10-CM | POA: Diagnosis not present

## 2022-02-06 DIAGNOSIS — K319 Disease of stomach and duodenum, unspecified: Secondary | ICD-10-CM | POA: Diagnosis not present

## 2022-02-06 DIAGNOSIS — K7010 Alcoholic hepatitis without ascites: Secondary | ICD-10-CM | POA: Diagnosis not present

## 2022-02-06 DIAGNOSIS — K767 Hepatorenal syndrome: Secondary | ICD-10-CM | POA: Diagnosis not present

## 2022-02-06 DIAGNOSIS — K2980 Duodenitis without bleeding: Secondary | ICD-10-CM | POA: Diagnosis not present

## 2022-02-06 DIAGNOSIS — D689 Coagulation defect, unspecified: Secondary | ICD-10-CM | POA: Diagnosis not present

## 2022-02-06 DIAGNOSIS — D649 Anemia, unspecified: Secondary | ICD-10-CM | POA: Diagnosis not present

## 2022-02-06 DIAGNOSIS — R69 Illness, unspecified: Secondary | ICD-10-CM | POA: Diagnosis not present

## 2022-02-06 DIAGNOSIS — E785 Hyperlipidemia, unspecified: Secondary | ICD-10-CM | POA: Diagnosis not present

## 2022-02-06 DIAGNOSIS — Z8616 Personal history of COVID-19: Secondary | ICD-10-CM | POA: Diagnosis not present

## 2022-02-06 LAB — COMPLETE METABOLIC PANEL WITH GFR
AG Ratio: 1.6 (calc) (ref 1.0–2.5)
ALT: 13 U/L (ref 9–46)
AST: 35 U/L (ref 10–40)
Albumin: 3.9 g/dL (ref 3.6–5.1)
Alkaline phosphatase (APISO): 123 U/L (ref 36–130)
BUN/Creatinine Ratio: 9 (calc) (ref 6–22)
BUN: 14 mg/dL (ref 7–25)
CO2: 23 mmol/L (ref 20–32)
Calcium: 9.4 mg/dL (ref 8.6–10.3)
Chloride: 104 mmol/L (ref 98–110)
Creat: 1.53 mg/dL — ABNORMAL HIGH (ref 0.60–1.29)
Globulin: 2.5 g/dL (calc) (ref 1.9–3.7)
Glucose, Bld: 88 mg/dL (ref 65–99)
Potassium: 3.5 mmol/L (ref 3.5–5.3)
Sodium: 140 mmol/L (ref 135–146)
Total Bilirubin: 3.1 mg/dL — ABNORMAL HIGH (ref 0.2–1.2)
Total Protein: 6.4 g/dL (ref 6.1–8.1)
eGFR: 55 mL/min/{1.73_m2} — ABNORMAL LOW (ref >=60)

## 2022-02-06 LAB — CBC WITH DIFFERENTIAL/PLATELET
Absolute Monocytes: 1146 cells/uL — ABNORMAL HIGH (ref 200–950)
Basophils Absolute: 47 cells/uL (ref 0–200)
Basophils Relative: 0.6 %
Eosinophils Absolute: 182 cells/uL (ref 15–500)
Eosinophils Relative: 2.3 %
HCT: 32.2 % — ABNORMAL LOW (ref 38.5–50.0)
Hemoglobin: 11.6 g/dL — ABNORMAL LOW (ref 13.2–17.1)
Lymphs Abs: 1849 cells/uL (ref 850–3900)
MCH: 32 pg (ref 27.0–33.0)
MCHC: 36 g/dL (ref 32.0–36.0)
MCV: 88.7 fL (ref 80.0–100.0)
MPV: 10.7 fL (ref 7.5–12.5)
Monocytes Relative: 14.5 %
Neutro Abs: 4677 cells/uL (ref 1500–7800)
Neutrophils Relative %: 59.2 %
Platelets: 134 10*3/uL — ABNORMAL LOW (ref 140–400)
RBC: 3.63 10*6/uL — ABNORMAL LOW (ref 4.20–5.80)
RDW: 14.8 % (ref 11.0–15.0)
Total Lymphocyte: 23.4 %
WBC: 7.9 10*3/uL (ref 3.8–10.8)

## 2022-02-07 DIAGNOSIS — K2980 Duodenitis without bleeding: Secondary | ICD-10-CM | POA: Diagnosis not present

## 2022-02-07 DIAGNOSIS — E785 Hyperlipidemia, unspecified: Secondary | ICD-10-CM | POA: Diagnosis not present

## 2022-02-07 DIAGNOSIS — R69 Illness, unspecified: Secondary | ICD-10-CM | POA: Diagnosis not present

## 2022-02-07 DIAGNOSIS — K767 Hepatorenal syndrome: Secondary | ICD-10-CM | POA: Diagnosis not present

## 2022-02-07 DIAGNOSIS — K7010 Alcoholic hepatitis without ascites: Secondary | ICD-10-CM | POA: Diagnosis not present

## 2022-02-07 DIAGNOSIS — K219 Gastro-esophageal reflux disease without esophagitis: Secondary | ICD-10-CM | POA: Diagnosis not present

## 2022-02-07 DIAGNOSIS — F10188 Alcohol abuse with other alcohol-induced disorder: Secondary | ICD-10-CM | POA: Diagnosis not present

## 2022-02-07 DIAGNOSIS — K319 Disease of stomach and duodenum, unspecified: Secondary | ICD-10-CM | POA: Diagnosis not present

## 2022-02-07 DIAGNOSIS — D689 Coagulation defect, unspecified: Secondary | ICD-10-CM | POA: Diagnosis not present

## 2022-02-07 DIAGNOSIS — Z8616 Personal history of COVID-19: Secondary | ICD-10-CM | POA: Diagnosis not present

## 2022-02-07 DIAGNOSIS — D649 Anemia, unspecified: Secondary | ICD-10-CM | POA: Diagnosis not present

## 2022-02-07 NOTE — Telephone Encounter (Signed)
Patient has no outstanding balance with [place].

## 2022-02-14 ENCOUNTER — Other Ambulatory Visit: Payer: No Typology Code available for payment source

## 2022-02-14 DIAGNOSIS — E860 Dehydration: Secondary | ICD-10-CM

## 2022-02-14 DIAGNOSIS — R69 Illness, unspecified: Secondary | ICD-10-CM | POA: Diagnosis not present

## 2022-02-14 DIAGNOSIS — K7010 Alcoholic hepatitis without ascites: Secondary | ICD-10-CM

## 2022-02-15 LAB — COMPREHENSIVE METABOLIC PANEL
AG Ratio: 1.4 (calc) (ref 1.0–2.5)
ALT: 14 U/L (ref 9–46)
AST: 29 U/L (ref 10–35)
Albumin: 3.4 g/dL — ABNORMAL LOW (ref 3.6–5.1)
Alkaline phosphatase (APISO): 118 U/L (ref 35–144)
BUN: 11 mg/dL (ref 7–25)
CO2: 20 mmol/L (ref 20–32)
Calcium: 9.1 mg/dL (ref 8.6–10.3)
Chloride: 111 mmol/L — ABNORMAL HIGH (ref 98–110)
Creat: 0.9 mg/dL (ref 0.70–1.30)
Globulin: 2.4 g/dL (calc) (ref 1.9–3.7)
Glucose, Bld: 103 mg/dL — ABNORMAL HIGH (ref 65–99)
Potassium: 3.9 mmol/L (ref 3.5–5.3)
Sodium: 139 mmol/L (ref 135–146)
Total Bilirubin: 3 mg/dL — ABNORMAL HIGH (ref 0.2–1.2)
Total Protein: 5.8 g/dL — ABNORMAL LOW (ref 6.1–8.1)

## 2022-02-19 ENCOUNTER — Telehealth: Payer: Self-pay

## 2022-02-19 NOTE — Telephone Encounter (Signed)
Attempted to contact patient to schedule a Palliative Care consult appointment. No answer left a message to return call.  

## 2022-02-21 ENCOUNTER — Telehealth: Payer: Self-pay

## 2022-02-21 NOTE — Telephone Encounter (Signed)
Spoke with patient and scheduled a Mychart Palliative Consult for 02/28/22 @ 12:30 PM.  Consent obtained; updated [facility], Team List and Epic.

## 2022-02-28 ENCOUNTER — Telehealth: Payer: Self-pay | Admitting: Nurse Practitioner

## 2022-02-28 ENCOUNTER — Encounter: Payer: Self-pay | Admitting: Nurse Practitioner

## 2022-02-28 DIAGNOSIS — Z515 Encounter for palliative care: Secondary | ICD-10-CM

## 2022-02-28 DIAGNOSIS — R630 Anorexia: Secondary | ICD-10-CM

## 2022-02-28 DIAGNOSIS — R634 Abnormal weight loss: Secondary | ICD-10-CM | POA: Diagnosis not present

## 2022-02-28 NOTE — Progress Notes (Signed)
[facility] Consult Note Telephone: [phone]  Fax: [phone]   Date of encounter: 02/28/22 7:40 PM PATIENT NAME: Christopher Carrillo [address] [place] [address]   [phone] (home)  DOB: 1970-11-09 MRN: [id number] PRIMARY CARE PROVIDER:    Susy Frizzle, MD,  [address] [place] [address] [phone]  REFERRING PROVIDER:   Susy Frizzle, MD [address] [place],  [place] [address] [phone]  RESPONSIBLE PARTY:    Contact Information     Name Relation Home Work Mobile   [place] Father [phone]        Due to the COVID-19 crisis, this visit was done via telemedicine from my office and it was initiated and consent by this patient and or family.  I connected with  Christopher Carrillo OR PROXY on 02/28/22 by a video enabled telemedicine application and verified that I am speaking with the correct person using two identifiers.   I discussed the limitations of evaluation and management by telemedicine. The patient expressed understanding and agreed to proceed. Palliative Care was asked to follow this patient by consultation request of  Susy Frizzle, MD to address advance care planning and complex medical decision making. This is the initial visit.                   ASSESSMENT AND PLAN / RECOMMENDATIONS:  Symptom Management/Plan: 1. Advance Care Planning;  Ongoing discussions wishes are to move forward with option of a liver transplant, wishes are to have an initial consult.   PC NP to sign off due to program change. Will have PC RN/PC SW to f/u for another visit for further sorting through goc, optional resources and f/u with primary to ensure initial consult scheduled for transplant team if primary in agreement.   2. Goals of Care: Goals include to maximize quality of life and symptom management. Our advance care planning conversation included a discussion about:     The value and importance of advance care planning  Exploration of personal, cultural or spiritual beliefs that might influence medical decisions  Exploration of goals of care in the event of a sudden injury or illness  Identification and preparation of a healthcare agent   Review and updating or creation of an advance directive document. 3. Anorexia; discussed at length about nutrition, weight loss, education provided.  08/08/2021 weight 180 lbs 10/31/2021 weight 172 lbs 02/05/2022 weight 154 lbs 26 lbs/6 months; 14.44% loss 4. Palliative care encounter; Palliative care encounter; Palliative medicine team will continue to support patient, patient's family, and medical team. Visit consisted of counseling and education dealing with the complex and emotionally intense issues of symptom management and palliative care in the setting of serious and potentially life-threatening illness I spent 41 minutes providing this consultation. More than 50% of the time in this consultation was spent in counseling and care coordination. PPS: 70% Chief Complaint: Initial palliative consult for complex medical decision making, address goals, manage ongoing symptoms  HISTORY OF PRESENT ILLNESS:  Christopher Carrillo is a 51 y.o. year old male  with multiple medical problems including alcoholic cirrhosis, hepatorenal syndrome, hepatic steatosis, h/o portal systemic encephalopathy, h/o GI bleed, GERD, HTN,  psoriatic arthritis, polyarthralgia, h/o hyponatremia, HLD, anemia of chronic disease, anxiety, alcohol abuse, tobacco abuse. I connected with Christopher Carrillo. We talked about past medical history, currently living independently. We talked about chronic disease of cirrhosis with events and last 6 months. Christopher Carrillo endorses he was told 6 months ago his life expectancy is 6 months. Christopher Carrillo endorses he has been alcohol free since May 2023 which is 6 months when his partner left. We talked about family/social history, he has  one adult son. 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 Christopher Carrillo endorses his sister does help him, driving him where he needs to go. We talked about weight loss, ros, appetite, nutrition. We talked about dietician consult though Christopher Carrillo endorses he has talked to several dieticians already. Christopher Carrillo endorses Dr Dennard Schaumann was going to try marinol. We talked about marinol, effects. We talked about disease progression. Medical goals reviewed. Christopher Carrillo wishes to proceed with option of liver transplant. Christopher Carrillo endorses he was told he was removed from "the list" since his life expectancy was less than 6 months, plus active alcoholic at the time. Since he has been abstinent, overall health has seemed to stabilized except weight loss, taken off diuretics. We talked about daily routine, what brings him joy. We talked about f/u pc visit with pc sw/pc rn for further discussions of goc. Christopher Carrillo in agreement. Therapeutic listening, emotional support provided. Questions answered. Contact information provided.   History obtained from review of EMR, discussion with primary team, and interview with family, facility staff/caregiver and/or Christopher. Carrillo.  I reviewed available labs, medications, imaging, studies and related documents from the EMR.  Records reviewed and summarized above.   ROS 10 point system reviewed all negative except +weight loss  Physical Exam: deferred CURRENT PROBLEM LIST:  Patient Active Problem List   Diagnosis Date Noted   Malnutrition of moderate degree 05/03/2021   Ascites    Hepatorenal syndrome ([place]) 05/01/2021   ETOH abuse 05/01/2021   COVID-19    Jaundice 04/04/2021   Overweight (BMI 25.0-29.9) 09/28/2020   Nausea & vomiting 09/23/2020   GI bleed 09/23/2020   Hyponatremia 09/23/2020   Hypokalemia 09/23/2020   Dehydration 09/23/2020   Acute kidney failure ([place]) 09/23/2020   Acute diarrhea 70/92/9574   Alcoholic cirrhosis ([place]) 73/40/3709    Encephalopathy, portal systemic ([place]) 08/09/2020   Polyarthralgia 01/08/2020   History of diverticulosis 10/28/2019   Internal and external bleeding hemorrhoids 08/20/2019   Colon ulcer    Hematochezia 08/12/2019   Leukocytosis 08/12/2019   Coagulopathy ([place])    Lower extremity edema 07/29/2019   Urinary hesitancy 07/29/2019   Anemia of chronic disease 64/38/3818   Alcoholic hepatitis 40/37/5436   Elevated bilirubin    Elevated LFTs 07/14/2019   Hepatic steatosis 02/18/2019   GAD (generalized anxiety disorder) 02/18/2019   GERD (gastroesophageal reflux disease) 04/08/2013   Abdominal pain 04/07/2013   Hyperlipidemia 12/19/2012   Essential hypertension 12/19/2012   Routine general medical examination at a health care facility 08/07/2012   Psoriatic arthritis ([place]) 05/04/2012   Obese 05/04/2012   Tobacco use 05/04/2012   PAST MEDICAL HISTORY:  Active Ambulatory Problems    Diagnosis Date Noted   Psoriatic arthritis ([place]) 05/04/2012   Obese 05/04/2012   Tobacco use 05/04/2012   Routine general medical examination at a health care facility 08/07/2012   Hyperlipidemia 12/19/2012   Essential hypertension 12/19/2012   Abdominal pain 04/07/2013   GERD (gastroesophageal reflux disease) 04/08/2013   Hepatic steatosis 02/18/2019   GAD (generalized anxiety disorder) 02/18/2019   Elevated LFTs 07/14/2019   Elevated bilirubin    Alcoholic hepatitis 06/77/0340   Lower extremity edema 07/29/2019   Urinary hesitancy 07/29/2019   Anemia of chronic disease 07/29/2019  Alcoholic cirrhosis ([place]) 00/17/4944   Coagulopathy ([place])    Hematochezia 08/12/2019   Leukocytosis 08/12/2019   Colon ulcer    Internal and external bleeding hemorrhoids 08/20/2019   History of diverticulosis 10/28/2019   Polyarthralgia 01/08/2020   Acute diarrhea 09/22/2020   Nausea & vomiting 09/23/2020   GI bleed 09/23/2020   Hyponatremia 09/23/2020   Hypokalemia 09/23/2020   Dehydration 09/23/2020   Acute  kidney failure ([place]) 09/23/2020   Overweight (BMI 25.0-29.9) 09/28/2020   Encephalopathy, portal systemic ([place]) 08/09/2020   Jaundice 04/04/2021   COVID-19    Hepatorenal syndrome ([place]) 05/01/2021   ETOH abuse 05/01/2021   Malnutrition of moderate degree 05/03/2021   Ascites    Resolved Ambulatory Problems    Diagnosis Date Noted   Guttate psoriasis 05/04/2012   MDD (major depressive disorder) 05/25/2019   Colonic mass 07/14/2019   Elevated liver enzymes 96/75/9163   Alcoholic hepatitis without ascites    Thrombocytopenia ([place]) 07/29/2019   Acute liver failure without hepatic coma    Past Medical History:  Diagnosis Date   Anemia    Anxiety    Arthritis    Psoriasis    Transaminitis    SOCIAL HX:  Social History   Tobacco Use   Smoking status: Every Day    Packs/day: 0.33    Types: Cigarettes   Smokeless tobacco: Never  Substance Use Topics   Alcohol use: Yes    Alcohol/week: 1.0 standard drink of alcohol    Types: 1 Cans of beer per week    Comment: occasionally   FAMILY HX:  Family History  Problem Relation Age of Onset   Asthma Father    Cancer Sister 59       Pharyngeal   Colon cancer Neg Hx    Liver disease Neg Hx    Esophageal cancer Neg Hx    Rectal cancer Neg Hx    Stomach cancer Neg Hx       ALLERGIES: No Known Allergies   PERTINENT MEDICATIONS:  Outpatient Encounter Medications as of 02/28/2022  Medication Sig   dronabinol (MARINOL) 2.5 MG capsule Take 1 capsule (2.5 mg total) by mouth 2 (two) times daily before a meal.   feeding supplement (ENSURE ENLIVE / ENSURE PLUS) LIQD Take 237 mLs by mouth 3 (three) times daily between meals.   furosemide (LASIX) 40 MG tablet Take 1 tablet (40 mg total) by mouth daily.   hydrOXYzine (VISTARIL) 25 MG capsule Take 1 capsule (25 mg total) by mouth at bedtime as needed (sleep).   lactulose (CHRONULAC) 10 GM/15ML solution Take 30 mLs (20 g total) by mouth daily.   potassium chloride SA (KLOR-CON M) 20 MEQ  tablet TAKE 1 TABLET BY MOUTH EVERY DAY   spironolactone (ALDACTONE) 25 MG tablet Take 1 tablet (25 mg total) by mouth daily.   traMADol (ULTRAM) 50 MG tablet TAKE 1 TABLET(50 MG) BY MOUTH EVERY 6 HOURS AS NEEDED   No facility-administered encounter medications on file as of 02/28/2022.   Thank you for the opportunity to participate in the care of Christopher. Carrillo.  The palliative care team will continue to follow. Please call our office at [phone] if we can be of additional assistance.   Takira Sherrin Z Dmiya Malphrus, NP ,

## 2022-03-13 ENCOUNTER — Telehealth: Payer: Self-pay

## 2022-03-13 NOTE — Telephone Encounter (Signed)
230 pm.  Phone all made to patient to follow up on PC NP's last visit.  No answer.  Message left requesting a call back.

## 2022-03-22 ENCOUNTER — Other Ambulatory Visit: Payer: No Typology Code available for payment source

## 2022-03-22 DIAGNOSIS — K7682 Hepatic encephalopathy: Secondary | ICD-10-CM | POA: Diagnosis not present

## 2022-03-22 DIAGNOSIS — R69 Illness, unspecified: Secondary | ICD-10-CM | POA: Diagnosis not present

## 2022-03-22 DIAGNOSIS — K7010 Alcoholic hepatitis without ascites: Secondary | ICD-10-CM

## 2022-03-23 LAB — COMPREHENSIVE METABOLIC PANEL
AG Ratio: 1.5 (calc) (ref 1.0–2.5)
ALT: 16 U/L (ref 9–46)
AST: 31 U/L (ref 10–35)
Albumin: 3.5 g/dL — ABNORMAL LOW (ref 3.6–5.1)
Alkaline phosphatase (APISO): 119 U/L (ref 35–144)
BUN: 15 mg/dL (ref 7–25)
CO2: 23 mmol/L (ref 20–32)
Calcium: 9.3 mg/dL (ref 8.6–10.3)
Chloride: 109 mmol/L (ref 98–110)
Creat: 1.13 mg/dL (ref 0.70–1.30)
Globulin: 2.4 g/dL (calc) (ref 1.9–3.7)
Glucose, Bld: 94 mg/dL (ref 65–99)
Potassium: 3.9 mmol/L (ref 3.5–5.3)
Sodium: 141 mmol/L (ref 135–146)
Total Bilirubin: 3.3 mg/dL — ABNORMAL HIGH (ref 0.2–1.2)
Total Protein: 5.9 g/dL — ABNORMAL LOW (ref 6.1–8.1)

## 2022-03-23 LAB — CBC WITH DIFFERENTIAL/PLATELET
Absolute Monocytes: 483 cells/uL (ref 200–950)
Basophils Absolute: 49 cells/uL (ref 0–200)
Basophils Relative: 1.4 %
Eosinophils Absolute: 196 cells/uL (ref 15–500)
Eosinophils Relative: 5.6 %
HCT: 30.1 % — ABNORMAL LOW (ref 38.5–50.0)
Hemoglobin: 10.8 g/dL — ABNORMAL LOW (ref 13.2–17.1)
Lymphs Abs: 1117 cells/uL (ref 850–3900)
MCH: 32.4 pg (ref 27.0–33.0)
MCHC: 35.9 g/dL (ref 32.0–36.0)
MCV: 90.4 fL (ref 80.0–100.0)
MPV: 10.7 fL (ref 7.5–12.5)
Monocytes Relative: 13.8 %
Neutro Abs: 1656 cells/uL (ref 1500–7800)
Neutrophils Relative %: 47.3 %
Platelets: 120 10*3/uL — ABNORMAL LOW (ref 140–400)
RBC: 3.33 10*6/uL — ABNORMAL LOW (ref 4.20–5.80)
RDW: 15.9 % — ABNORMAL HIGH (ref 11.0–15.0)
Total Lymphocyte: 31.9 %
WBC: 3.5 10*3/uL — ABNORMAL LOW (ref 3.8–10.8)

## 2022-04-19 ENCOUNTER — Other Ambulatory Visit: Payer: Self-pay

## 2022-04-19 DIAGNOSIS — Z515 Encounter for palliative care: Secondary | ICD-10-CM

## 2022-04-19 NOTE — Progress Notes (Signed)
PATIENT NAME: Christopher Carrillo DOB: 12-Aug-1970 MRN: [id number]  PRIMARY CARE PROVIDER: Susy Frizzle, MD  RESPONSIBLE PARTY:  Acct ID - Guarantor Home Phone Work Phone Relationship Acct Type  [id number] [facility]* [phone]  Self P/F     [place], [address] [place], [place] [address]    I connected with  [name] on 04/19/22 by telephone and verified that I am speaking with the correct person using two identifiers.   I discussed the limitations of evaluation and management by telemedicine. The patient expressed understanding and agreed to proceed.    Liver: Patient discharged from hospice 11/23 as he was seeking more aggressive treatment for cirrhosis. Patient advised he is not currently on a transplant list for his liver.  We discussed following up with PCP to further discuss as referral would likely be needed.  Patient completed blood work last month with PCP office.  Patient has not consumed alcohol in the last 8 months.  Occasional issues with nausea but this has improved.  Goals:  Discussed with patient.  At this time, he is taking time to travel and see/do things that have been on his bucket list.  Patient states he does not know what his life expectancy will be as he outlived the 6 month prognosis last year but will live life to the fullest.  Palliative Care:  Discussed recent restructure of the Palliative Care Program.  At this time, patient desires phone contact.  He is aware he can contact PC for a home visit if needed.  He will continue to follow up with PCP as needed.   CODE STATUS: DNR ADVANCED DIRECTIVES: No MOST FORM: No PPS: 60%   Lorenza Burton, RN

## 2022-04-30 ENCOUNTER — Ambulatory Visit: Payer: No Typology Code available for payment source | Admitting: Family Medicine

## 2022-04-30 ENCOUNTER — Encounter: Payer: Self-pay | Admitting: Family Medicine

## 2022-04-30 VITALS — BP 120/62 | HR 87 | Temp 97.9°F | Ht 68.0 in | Wt 153.4 lb

## 2022-04-30 DIAGNOSIS — R69 Illness, unspecified: Secondary | ICD-10-CM | POA: Diagnosis not present

## 2022-04-30 DIAGNOSIS — K7030 Alcoholic cirrhosis of liver without ascites: Secondary | ICD-10-CM

## 2022-04-30 DIAGNOSIS — D649 Anemia, unspecified: Secondary | ICD-10-CM

## 2022-04-30 DIAGNOSIS — K7460 Unspecified cirrhosis of liver: Secondary | ICD-10-CM

## 2022-04-30 NOTE — Progress Notes (Signed)
Wt Readings from Last 3 Encounters:  04/30/22 153 lb 6.4 oz (69.6 kg)  02/05/22 154 lb (69.9 kg)  10/31/21 172 lb (78 kg)     Subjective:    Patient ID: Christopher Carrillo, male    DOB: 08/08/1970, 52 y.o.   MRN: [id number]  HPI In February, he was on hospice due to liver failure from alcoholic cirrhosis.  Patient had remain abstinent from alcohol for 4 months but had a relapse in May where he started drinking beer.  Around the same time his marriage dissolved.  This was in May.  He has been abstinent from alcohol ever since May.  He has no desire to drink.  His lab work has been stable.  He does have physical stigmata of cirrhosis including spider telangiectasias, gynecomastia, and elevated INR, low albumin, and persistently elevated bilirubin.  He is interested in possibly getting on the transplant list as he is maintaining his abstinence from alcohol.  He has not followed up with his gastroenterologist.  On his most recent blood work in December, he had a mildly suppressed white count of 3.2.  He also had mildly suppressed platelet count of 120 however his hemoglobin was markedly abnormal at 10 point.  While some of this I believe is attributable to his cirrhosis, he has not been evaluated for his anemia. Past Medical History:  Diagnosis Date   Abdominal pain    Alcoholic hepatitis without ascites    Anemia    Anxiety    Arthritis    Elevated bilirubin    Essential hypertension    Psoriasis    Transaminitis    Past Surgical History:  Procedure Laterality Date   BIOPSY  07/20/2019   Procedure: BIOPSY;  Surgeon: Daneil Dolin, MD;  [facility];  Service: Endoscopy;;   BIOPSY  08/14/2019   Procedure: BIOPSY;  Surgeon: Thornton Park, MD;  Location: [facility];  Service: Gastroenterology;;   COLONOSCOPY     COLONOSCOPY WITH PROPOFOL N/A 07/20/2019   Procedure: COLONOSCOPY WITH PROPOFOL;  Surgeon: Daneil Dolin, MD;  [facility];  Service: Endoscopy;   Laterality: N/A;  10:15am   COLONOSCOPY WITH PROPOFOL N/A 08/14/2019   Procedure: COLONOSCOPY WITH PROPOFOL;  Surgeon: Thornton Park, MD;  Location: [facility];  Service: Gastroenterology;  Laterality: N/A;   IR PARACENTESIS  08/14/2019   IR PARACENTESIS  05/02/2021   POLYPECTOMY  07/20/2019   Procedure: POLYPECTOMY;  Surgeon: Daneil Dolin, MD;  [facility];  Service: Endoscopy;;   SUBMUCOSAL TATTOO INJECTION  08/14/2019   Procedure: SUBMUCOSAL TATTOO INJECTION;  Surgeon: Thornton Park, MD;  Location: [facility];  Service: Gastroenterology;;   Current Outpatient Medications on File Prior to Visit  Medication Sig Dispense Refill   dronabinol (MARINOL) 2.5 MG capsule Take 1 capsule (2.5 mg total) by mouth 2 (two) times daily before a meal. 60 capsule 3   feeding supplement (ENSURE ENLIVE / ENSURE PLUS) LIQD Take 237 mLs by mouth 3 (three) times daily between meals. 237 mL 12   furosemide (LASIX) 40 MG tablet Take 1 tablet (40 mg total) by mouth daily. 30 tablet 3   hydrOXYzine (VISTARIL) 25 MG capsule Take 1 capsule (25 mg total) by mouth at bedtime as needed (sleep). 30 capsule 0   lactulose (CHRONULAC) 10 GM/15ML solution Take 30 mLs (20 g total) by mouth daily. 473 mL 2   potassium chloride SA (KLOR-CON M) 20 MEQ tablet TAKE 1 TABLET BY MOUTH EVERY DAY 30 tablet  0   spironolactone (ALDACTONE) 25 MG tablet Take 1 tablet (25 mg total) by mouth daily. 30 tablet 5   traMADol (ULTRAM) 50 MG tablet TAKE 1 TABLET(50 MG) BY MOUTH EVERY 6 HOURS AS NEEDED 30 tablet 0   No current facility-administered medications on file prior to visit.   No Known Allergies  Social History   Socioeconomic History   Marital status: Married    Spouse name: Not on file   Number of children: Not on file   Years of education: Not on file   Highest education level: Not on file  Occupational History   Not on file  Tobacco Use   Smoking status: Every Day    Packs/day: 0.33    Types:  Cigarettes   Smokeless tobacco: Never  Vaping Use   Vaping Use: Never used  Substance and Sexual Activity   Alcohol use: Yes    Alcohol/week: 1.0 standard drink of alcohol    Types: 1 Cans of beer per week    Comment: occasionally   Drug use: No   Sexual activity: Yes  Other Topics Concern   Not on file  Social History Narrative   Not on file   Social Determinants of Health   Financial Resource Strain: Not on file  Food Insecurity: Not on file  Transportation Needs: Not on file  Physical Activity: Not on file  Stress: Not on file  Social Connections: Not on file  Intimate Partner Violence: Not on file     Review of Systems     Objective:   Physical Exam Vitals reviewed.  Constitutional:      General: He is not in acute distress.    Appearance: Normal appearance. He is normal weight. He is not ill-appearing, toxic-appearing or diaphoretic.  Eyes:     General: No scleral icterus. Cardiovascular:     Rate and Rhythm: Normal rate and regular rhythm.     Heart sounds: Normal heart sounds. No murmur heard.    No friction rub. No gallop.  Pulmonary:     Effort: Pulmonary effort is normal. No respiratory distress.     Breath sounds: Normal breath sounds. No stridor. No wheezing, rhonchi or rales.  Abdominal:     General: Abdomen is flat. Bowel sounds are normal. There is no distension.     Palpations: Abdomen is soft.     Tenderness: There is no abdominal tenderness. There is no guarding or rebound.  Musculoskeletal:     Right lower leg: No edema.     Left lower leg: No edema.  Skin:    Coloration: Skin is not jaundiced or pale.  Neurological:     General: No focal deficit present.     Mental Status: He is alert and oriented to person, place, and time. Mental status is at baseline.  Psychiatric:        Attention and Perception: Attention normal.        Mood and Affect: Mood is not depressed. Affect is not tearful.        Speech: Speech normal.        Behavior:  Behavior normal.        Thought Content: Thought content normal.        Cognition and Memory: Cognition and memory normal.           Assessment & Plan:  Cirrhosis of liver without ascites, unspecified hepatic cirrhosis type ([place]) - Plan: Ambulatory referral to Gastroenterology, Fecal Globin By Immunochemistry, Ferritin, Vitamin B12  Anemia, unspecified type - Plan: Fecal Globin By Immunochemistry, Ferritin, Vitamin B12  I believe the patient's weight loss is likely due to the fact he is no longer experiencing edema.  He is abstinent from alcohol.  Therefore I will refer the patient back to his gastroenterologist however I believe his liver function is stable.  I am concerned about his anemia so I will check a an iron level, B12 level, and the stool test for blood to workup his anemia.

## 2022-05-01 LAB — COMPLETE METABOLIC PANEL WITH GFR
AG Ratio: 1.7 (calc) (ref 1.0–2.5)
ALT: 24 U/L (ref 9–46)
AST: 43 U/L — ABNORMAL HIGH (ref 10–35)
Albumin: 3.9 g/dL (ref 3.6–5.1)
Alkaline phosphatase (APISO): 153 U/L — ABNORMAL HIGH (ref 35–144)
BUN: 13 mg/dL (ref 7–25)
CO2: 24 mmol/L (ref 20–32)
Calcium: 9.4 mg/dL (ref 8.6–10.3)
Chloride: 107 mmol/L (ref 98–110)
Creat: 1.16 mg/dL (ref 0.70–1.30)
Globulin: 2.3 g/dL (calc) (ref 1.9–3.7)
Glucose, Bld: 110 mg/dL — ABNORMAL HIGH (ref 65–99)
Potassium: 4 mmol/L (ref 3.5–5.3)
Sodium: 142 mmol/L (ref 135–146)
Total Bilirubin: 3.3 mg/dL — ABNORMAL HIGH (ref 0.2–1.2)
Total Protein: 6.2 g/dL (ref 6.1–8.1)
eGFR: 77 mL/min/{1.73_m2} (ref >=60)

## 2022-05-01 LAB — CBC WITH DIFFERENTIAL/PLATELET
Absolute Monocytes: 600 cells/uL (ref 200–950)
Basophils Absolute: 48 cells/uL (ref 0–200)
Basophils Relative: 1 %
Eosinophils Absolute: 101 cells/uL (ref 15–500)
Eosinophils Relative: 2.1 %
HCT: 30.9 % — ABNORMAL LOW (ref 38.5–50.0)
Hemoglobin: 11.1 g/dL — ABNORMAL LOW (ref 13.2–17.1)
Lymphs Abs: 1488 cells/uL (ref 850–3900)
MCH: 31.8 pg (ref 27.0–33.0)
MCHC: 35.9 g/dL (ref 32.0–36.0)
MCV: 88.5 fL (ref 80.0–100.0)
MPV: 10.4 fL (ref 7.5–12.5)
Monocytes Relative: 12.5 %
Neutro Abs: 2563 cells/uL (ref 1500–7800)
Neutrophils Relative %: 53.4 %
Platelets: 117 10*3/uL — ABNORMAL LOW (ref 140–400)
RBC: 3.49 10*6/uL — ABNORMAL LOW (ref 4.20–5.80)
RDW: 15.3 % — ABNORMAL HIGH (ref 11.0–15.0)
Total Lymphocyte: 31 %
WBC: 4.8 10*3/uL (ref 3.8–10.8)

## 2022-05-01 LAB — FERRITIN: Ferritin: 209 ng/mL (ref 38–380)

## 2022-05-01 LAB — VITAMIN B12: Vitamin B-12: 529 pg/mL (ref 200–1100)

## 2022-06-07 ENCOUNTER — Other Ambulatory Visit: Payer: No Typology Code available for payment source

## 2022-06-07 DIAGNOSIS — D649 Anemia, unspecified: Secondary | ICD-10-CM

## 2022-06-07 DIAGNOSIS — K7460 Unspecified cirrhosis of liver: Secondary | ICD-10-CM

## 2022-06-08 LAB — FECAL GLOBIN BY IMMUNOCHEMISTRY
FECAL GLOBIN RESULT:: NOT DETECTED
MICRO NUMBER:: 14632205
SPECIMEN QUALITY:: ADEQUATE

## 2022-07-24 ENCOUNTER — Telehealth: Payer: Self-pay | Admitting: Family Medicine

## 2022-07-24 NOTE — Telephone Encounter (Signed)
Patient came to office to drop off paperwork needed for insurance company. Requesting to be called when paperwork completed, signed, and ready for pickup.  Please advise at [phone].  Paperwork placed on nurse's desk.

## 2022-07-30 ENCOUNTER — Encounter: Payer: Self-pay | Admitting: Family Medicine

## 2022-07-30 ENCOUNTER — Ambulatory Visit: Payer: 59 | Admitting: Family Medicine

## 2022-07-30 VITALS — BP 120/62 | HR 73 | Temp 97.9°F | Ht 68.0 in | Wt 157.0 lb

## 2022-07-30 DIAGNOSIS — K7460 Unspecified cirrhosis of liver: Secondary | ICD-10-CM

## 2022-07-30 DIAGNOSIS — D649 Anemia, unspecified: Secondary | ICD-10-CM | POA: Diagnosis not present

## 2022-07-30 MED ORDER — LACTULOSE 10 GM/15ML PO SOLN: 20.0000 g | Freq: Every day | ORAL | 2 refills | Status: DC

## 2022-07-30 MED ORDER — SILDENAFIL CITRATE 100 MG PO TABS: 50.0000 mg | ORAL_TABLET | Freq: Every day | ORAL | 11 refills | Status: DC | PRN

## 2022-07-30 NOTE — Progress Notes (Signed)
Subjective:    Patient ID: Christopher Carrillo, male    DOB: 08-04-1970, 52 y.o.   MRN: [id number]  HPI 1/24 In February, he was on hospice due to liver failure from alcoholic cirrhosis.  Patient had remain abstinent from alcohol for 4 months but had a relapse in May where he started drinking beer.  Around the same time his marriage dissolved.  This was in May.  He has been abstinent from alcohol ever since May.  He has no desire to drink.  His lab work has been stable.  He does have physical stigmata of cirrhosis including spider telangiectasias, gynecomastia, and elevated INR, low albumin, and persistently elevated bilirubin.  He is interested in possibly getting on the transplant list as he is maintaining his abstinence from alcohol.  He has not followed up with his gastroenterologist.  On his most recent blood work in December, he had a mildly suppressed white count of 3.2.  He also had mildly suppressed platelet count of 120 however his hemoglobin was markedly abnormal at 10 point.  While some of this I believe is attributable to his cirrhosis, he has not been evaluated for his anemia.  At that time, my plan was:  I believe the patient's weight loss is likely due to the fact he is no longer experiencing edema.  He is abstinent from alcohol.  Therefore I will refer the patient back to his gastroenterologist however I believe his liver function is stable.  I am concerned about his anemia so I will check a an iron level, B12 level, and the stool test for blood to workup his anemia.  07/30/22 Ferritin and b12 were normal.  Stool was negative for blood.  His GI physician has not seen him recently and is leaving the system.  Patient is not taking Lasix, spironolactone, or potassium.  However there is no swelling in his extremities.  There is no evidence of any ascites.  The patient does have a sallow complexion but he is not jaundiced today.  He is maintaining his weight Wt Readings from Last 3  Encounters:  07/30/22 157 lb (71.2 kg)  04/30/22 153 lb 6.4 oz (69.6 kg)  02/05/22 154 lb (69.9 kg)   However he does report bleeding gums.  He also states that he uses a Q-tip he will see blood on the Q-tip.  I believe this is all a sign of his cirrhosis and bleeding dyscrasias secondary to the cirrhosis.  Therefore I would recommend checking a PT/INR as well as his platelet count.  He is not currently on the transplant list however he continues to remain abstinent from alcohol.  Therefore I feel that we need to get him back into see his gastroenterologist Past Medical History:  Diagnosis Date   Abdominal pain    Alcoholic hepatitis without ascites    Anemia    Anxiety    Arthritis    Elevated bilirubin    Essential hypertension    Psoriasis    Transaminitis    Past Surgical History:  Procedure Laterality Date   BIOPSY  07/20/2019   Procedure: BIOPSY;  Surgeon: Corbin Ade, MD;  [facility];  Service: Endoscopy;;   BIOPSY  08/14/2019   Procedure: BIOPSY;  Surgeon: Tressia Danas, MD;  Location: [facility]OSCOPY;  Service: Gastroenterology;;   COLONOSCOPY     COLONOSCOPY WITH PROPOFOL N/A 07/20/2019   Procedure: COLONOSCOPY WITH PROPOFOL;  Surgeon: Corbin Ade, MD;  [facility];  Service:  Endoscopy;  Laterality: N/A;  10:15am   COLONOSCOPY WITH PROPOFOL N/A 08/14/2019   Procedure: COLONOSCOPY WITH PROPOFOL;  Surgeon: Tressia Danas, MD;  Location: [facility] ENDOSCOPY;  Service: Gastroenterology;  Laterality: N/A;   IR PARACENTESIS  08/14/2019   IR PARACENTESIS  05/02/2021   POLYPECTOMY  07/20/2019   Procedure: POLYPECTOMY;  Surgeon: Corbin Ade, MD;  [facility];  Service: Endoscopy;;   SUBMUCOSAL TATTOO INJECTION  08/14/2019   Procedure: SUBMUCOSAL TATTOO INJECTION;  Surgeon: Tressia Danas, MD;  Location: [facility] ENDOSCOPY;  Service: Gastroenterology;;   Current Outpatient Medications on File Prior to Visit  Medication Sig Dispense Refill    dronabinol (MARINOL) 2.5 MG capsule Take 1 capsule (2.5 mg total) by mouth 2 (two) times daily before a meal. 60 capsule 3   feeding supplement (ENSURE ENLIVE / ENSURE PLUS) LIQD Take 237 mLs by mouth 3 (three) times daily between meals. 237 mL 12   furosemide (LASIX) 40 MG tablet Take 1 tablet (40 mg total) by mouth daily. 30 tablet 3   hydrOXYzine (VISTARIL) 25 MG capsule Take 1 capsule (25 mg total) by mouth at bedtime as needed (sleep). 30 capsule 0   lactulose (CHRONULAC) 10 GM/15ML solution Take 30 mLs (20 g total) by mouth daily. 473 mL 2   potassium chloride SA (KLOR-CON M) 20 MEQ tablet TAKE 1 TABLET BY MOUTH EVERY DAY 30 tablet 0   spironolactone (ALDACTONE) 25 MG tablet Take 1 tablet (25 mg total) by mouth daily. 30 tablet 5   traMADol (ULTRAM) 50 MG tablet TAKE 1 TABLET(50 MG) BY MOUTH EVERY 6 HOURS AS NEEDED 30 tablet 0   No current facility-administered medications on file prior to visit.   No Known Allergies  Social History   Socioeconomic History   Marital status: Married    Spouse name: Not on file   Number of children: Not on file   Years of education: Not on file   Highest education level: Not on file  Occupational History   Not on file  Tobacco Use   Smoking status: Every Day    Packs/day: .33    Types: Cigarettes   Smokeless tobacco: Never  Vaping Use   Vaping Use: Never used  Substance and Sexual Activity   Alcohol use: Yes    Alcohol/week: 1.0 standard drink of alcohol    Types: 1 Cans of beer per week    Comment: occasionally   Drug use: No   Sexual activity: Yes  Other Topics Concern   Not on file  Social History Narrative   Not on file   Social Determinants of Health   Financial Resource Strain: Not on file  Food Insecurity: Not on file  Transportation Needs: Not on file  Physical Activity: Not on file  Stress: Not on file  Social Connections: Not on file  Intimate Partner Violence: Not on file     Review of Systems     Objective:    Physical Exam Vitals reviewed.  Constitutional:      General: He is not in acute distress.    Appearance: Normal appearance. He is normal weight. He is not ill-appearing, toxic-appearing or diaphoretic.  Eyes:     General: No scleral icterus. Cardiovascular:     Rate and Rhythm: Normal rate and regular rhythm.     Heart sounds: Normal heart sounds. No murmur heard.    No friction rub. No gallop.  Pulmonary:     Effort: Pulmonary effort is normal. No respiratory distress.  Breath sounds: Normal breath sounds. No stridor. No wheezing, rhonchi or rales.  Abdominal:     General: Abdomen is flat. Bowel sounds are normal. There is no distension.     Palpations: Abdomen is soft.     Tenderness: There is no abdominal tenderness. There is no guarding or rebound.  Musculoskeletal:     Right lower leg: No edema.     Left lower leg: No edema.  Skin:    Coloration: Skin is not jaundiced or pale.  Neurological:     General: No focal deficit present.     Mental Status: He is alert and oriented to person, place, and time. Mental status is at baseline.  Psychiatric:        Attention and Perception: Attention normal.        Mood and Affect: Mood is not depressed. Affect is not tearful.        Speech: Speech normal.        Behavior: Behavior normal.        Thought Content: Thought content normal.        Cognition and Memory: Cognition and memory normal.           Assessment & Plan:  Cirrhosis of liver without ascites, unspecified hepatic cirrhosis type - Plan: CBC with Differential/Platelet, COMPLETE METABOLIC PANEL WITH GFR, Protime-INR, Magnesium, Ambulatory referral to Gastroenterology  Anemia, unspecified type Physically, the patient seems stable.  I will check a CBC a CMP and magnesium level PT and INR.  I will calculate his MELD score.  I want him to get back into see his gastroenterologist.  He has maintained sobriety now for 1 year and he is abstinent from all alcohol.   Therefore I believe the patient consider him for the transplant list.  However physically he is doing fairly well.  He is maintaining his weight.  He is not having any episodes of hepatic encephalopathy.  There is no evidence of ascites or fluid overload.  Therefore I am quite happy with how he is doing.  I congratulated the patient on maintaining abstinence.  I will consult GI

## 2022-07-31 LAB — CBC WITH DIFFERENTIAL/PLATELET
Absolute Monocytes: 524 cells/uL (ref 200–950)
Basophils Absolute: 51 cells/uL (ref 0–200)
Basophils Relative: 1.1 %
Eosinophils Absolute: 212 cells/uL (ref 15–500)
Eosinophils Relative: 4.6 %
HCT: 29.1 % — ABNORMAL LOW (ref 38.5–50.0)
Hemoglobin: 10.4 g/dL — ABNORMAL LOW (ref 13.2–17.1)
Lymphs Abs: 1702 cells/uL (ref 850–3900)
MCH: 32.1 pg (ref 27.0–33.0)
MCHC: 35.7 g/dL (ref 32.0–36.0)
MCV: 89.8 fL (ref 80.0–100.0)
MPV: 10.8 fL (ref 7.5–12.5)
Monocytes Relative: 11.4 %
Neutro Abs: 2111 cells/uL (ref 1500–7800)
Neutrophils Relative %: 45.9 %
Platelets: 103 10*3/uL — ABNORMAL LOW (ref 140–400)
RBC: 3.24 10*6/uL — ABNORMAL LOW (ref 4.20–5.80)
RDW: 14.9 % (ref 11.0–15.0)
Total Lymphocyte: 37 %
WBC: 4.6 10*3/uL (ref 3.8–10.8)

## 2022-07-31 LAB — COMPLETE METABOLIC PANEL WITH GFR
AG Ratio: 1.8 (calc) (ref 1.0–2.5)
ALT: 18 U/L (ref 9–46)
AST: 36 U/L — ABNORMAL HIGH (ref 10–35)
Albumin: 3.8 g/dL (ref 3.6–5.1)
Alkaline phosphatase (APISO): 203 U/L — ABNORMAL HIGH (ref 35–144)
BUN: 18 mg/dL (ref 7–25)
CO2: 24 mmol/L (ref 20–32)
Calcium: 9.4 mg/dL (ref 8.6–10.3)
Chloride: 110 mmol/L (ref 98–110)
Creat: 1.03 mg/dL (ref 0.70–1.30)
Globulin: 2.1 g/dL (calc) (ref 1.9–3.7)
Glucose, Bld: 94 mg/dL (ref 65–99)
Potassium: 4.1 mmol/L (ref 3.5–5.3)
Sodium: 142 mmol/L (ref 135–146)
Total Bilirubin: 2.4 mg/dL — ABNORMAL HIGH (ref 0.2–1.2)
Total Protein: 5.9 g/dL — ABNORMAL LOW (ref 6.1–8.1)
eGFR: 88 mL/min/{1.73_m2} (ref >=60)

## 2022-07-31 LAB — MAGNESIUM: Magnesium: 1.8 mg/dL (ref 1.5–2.5)

## 2022-07-31 LAB — PROTIME-INR
INR: 1.2 — ABNORMAL HIGH
Prothrombin Time: 12.5 s — ABNORMAL HIGH (ref 9.0–11.5)

## 2022-08-06 ENCOUNTER — Other Ambulatory Visit: Payer: Self-pay | Admitting: Family Medicine

## 2022-08-07 NOTE — Telephone Encounter (Signed)
Unable to refill per protocol, request was refilled 07/30/22. Duplicate request.  Requested Prescriptions  Pending Prescriptions Disp Refills   lactulose (CHRONULAC) 10 GM/15ML solution [Pharmacy Med Name: LACTULOSE 10 GM/15 ML SOLUTION]  1    Sig: TAKE 30 MLS (20 G TOTAL) BY MOUTH DAILY.     Gastroenterology:  Laxatives - lactulose Failed - 08/06/2022  2:30 PM      Failed - Valid encounter within last 12 months    Recent Outpatient Visits           1 year ago Alcoholic cirrhosis of liver without ascites (HCC)   [facility] Pickard, Priscille Heidelberg, MD   1 year ago Alcoholic hepatitis without ascites   [facility] Donita Brooks, MD   1 year ago Alcoholic hepatitis without ascites   [facility] Donita Brooks, MD   1 year ago Dark urine   [facility] Pickard, Priscille Heidelberg, MD   1 year ago Alcoholic cirrhosis, unspecified whether ascites present [facility])   [facility] Pickard, Priscille Heidelberg, MD              Passed - Cl in normal range and within 360 days    Chloride  Date Value Ref Range Status  07/30/2022 110 98 - 110 mmol/L Final         Passed - CO2 in normal range and within 360 days    CO2  Date Value Ref Range Status  07/30/2022 24 20 - 32 mmol/L Final         Passed - K in normal range and within 360 days    Potassium  Date Value Ref Range Status  07/30/2022 4.1 3.5 - 5.3 mmol/L Final         Passed - Na in normal range and within 360 days    Sodium  Date Value Ref Range Status  07/30/2022 142 135 - 146 mmol/L Final

## 2022-08-09 NOTE — Progress Notes (Deleted)
Consultation Note   Referring Provider:  ***[facility] PCP: Donita Brooks, MD Primary Gastroenterologist: ***        Reason for consultation: ***  DOA: (Not on file)           Brief Narrative:  Patient is a 52 y.o. year old male with a past medical history of    (see PMH for any additional medical problems)  Chronic anemia. Hgb stable 10.4  Assessment and Plan     Etoh cirrhosis -Two year surveillance EGD due Dec 2024 - Update AFP   History of Present Illness Christopher Carrillo  He was given 6 months to live in January 2023. He was in August until Aug 2023. No Etoh use since March 2023. Hasn't been back to see Annamarie Major, NP with [facility] since before January 2023.   [name] feels pretty good. He has occasionally swelling in legs after a long walk. Takes Lasix and Aldactone but only as needed. Watches salt intake but doesn't strictly calculated grams per day. Eats at home. PCP gave him marinol Rx for weight loss/ poor appetite but he hasn't filled the Rx because insurance wouldn't cover it. His weight has stabilized.    He has bleeding from gums and also ears when using q tips   Cirrhosis History Serologic workup: Ascites:     Diagnostic tap May 2021 and Jan 2023  Varices screening / surveillance EGD:   No history of varices. Last EGD Dec 2022 Hepatic encephalopathy: no history of HE Most recent HCC screening:   [place] Dec 2022  without focal liver lesions.  Last AFP March was normal.      Imaging and Labs No results for input(s): "WBC", "HGB", "HCT", "PLT" in the last 72 hours. No results for input(s): "NA", "K", "CL", "CO2", "GLUCOSE", "BUN", "CREATININE", "CALCIUM" in the last 72 hours. No results for input(s): "PROT", "ALBUMIN", "AST", "ALT", "ALKPHOS", "BILITOT", "BILIDIR", "IBILI" in the last 72 hours. No results for input(s): "HEPBSAG", "HCVAB", "HEPAIGM", "HEPBIGM" in the last 72 hours. No results for  input(s): "LABPROT", "INR" in the last 72 hours.  Previous GI Evaluation:      Active Problems:   * No active hospital problems. *     Past Medical History:  Diagnosis Date   Abdominal pain    Alcoholic hepatitis without ascites    Anemia    Anxiety    Arthritis    Elevated bilirubin    Essential hypertension    Psoriasis    Transaminitis     Past Surgical History:  Procedure Laterality Date   BIOPSY  07/20/2019   Procedure: BIOPSY;  Surgeon: Corbin Ade, MD;  [facility];  Service: Endoscopy;;   BIOPSY  08/14/2019   Procedure: BIOPSY;  Surgeon: Tressia Danas, MD;  Location: [facility] ENDOSCOPY;  Service: Gastroenterology;;   COLONOSCOPY     COLONOSCOPY WITH PROPOFOL N/A 07/20/2019   Procedure: COLONOSCOPY WITH PROPOFOL;  Surgeon: Corbin Ade, MD;  [facility];  Service: Endoscopy;  Laterality: N/A;  10:15am   COLONOSCOPY WITH PROPOFOL N/A 08/14/2019   Procedure: COLONOSCOPY WITH PROPOFOL;  Surgeon: Tressia Danas, MD;  Location: [facility] ENDOSCOPY;  Service: Gastroenterology;  Laterality: N/A;   IR PARACENTESIS  08/14/2019   IR PARACENTESIS  05/02/2021   POLYPECTOMY  07/20/2019   Procedure: POLYPECTOMY;  Surgeon: Corbin Ade, MD;  [facility];  Service: Endoscopy;;   SUBMUCOSAL TATTOO INJECTION  08/14/2019   Procedure: SUBMUCOSAL TATTOO INJECTION;  Surgeon: Tressia Danas, MD;  Location: [facility] ENDOSCOPY;  Service: Gastroenterology;;    Family History  Problem Relation Age of Onset   Asthma Father    Cancer Sister 5       Pharyngeal   Colon cancer Neg Hx    Liver disease Neg Hx    Esophageal cancer Neg Hx    Rectal cancer Neg Hx    Stomach cancer Neg Hx     Prior to Admission medications   Medication Sig Start Date End Date Taking? Authorizing Provider  dronabinol (MARINOL) 2.5 MG capsule Take 1 capsule (2.5 mg total) by mouth 2 (two) times daily before a meal. 02/05/22   Donita Brooks, MD  feeding supplement  (ENSURE ENLIVE / ENSURE PLUS) LIQD Take 237 mLs by mouth 3 (three) times daily between meals. 05/05/21   Kathlen Mody, MD  furosemide (LASIX) 40 MG tablet Take 1 tablet (40 mg total) by mouth daily. Patient not taking: Reported on 07/30/2022 11/02/21   Donita Brooks, MD  hydrOXYzine (VISTARIL) 25 MG capsule Take 1 capsule (25 mg total) by mouth at bedtime as needed (sleep). 10/31/21   Donita Brooks, MD  lactulose (CHRONULAC) 10 GM/15ML solution Take 30 mLs (20 g total) by mouth daily. 07/30/22   Donita Brooks, MD  potassium chloride SA (KLOR-CON M) 20 MEQ tablet TAKE 1 TABLET BY MOUTH EVERY DAY Patient not taking: Reported on 07/30/2022 07/21/21   Donita Brooks, MD  sildenafil (VIAGRA) 100 MG tablet Take 0.5-1 tablets (50-100 mg total) by mouth daily as needed for erectile dysfunction. 07/30/22   Donita Brooks, MD  spironolactone (ALDACTONE) 25 MG tablet Take 1 tablet (25 mg total) by mouth daily. Patient not taking: Reported on 07/30/2022 05/30/21   Tressia Danas, MD  traMADol (ULTRAM) 50 MG tablet TAKE 1 TABLET(50 MG) BY MOUTH EVERY 6 HOURS AS NEEDED 05/25/21   Donita Brooks, MD    Current Outpatient Medications  Medication Sig Dispense Refill   dronabinol (MARINOL) 2.5 MG capsule Take 1 capsule (2.5 mg total) by mouth 2 (two) times daily before a meal. 60 capsule 3   feeding supplement (ENSURE ENLIVE / ENSURE PLUS) LIQD Take 237 mLs by mouth 3 (three) times daily between meals. 237 mL 12   furosemide (LASIX) 40 MG tablet Take 1 tablet (40 mg total) by mouth daily. (Patient not taking: Reported on 07/30/2022) 30 tablet 3   hydrOXYzine (VISTARIL) 25 MG capsule Take 1 capsule (25 mg total) by mouth at bedtime as needed (sleep). 30 capsule 0   lactulose (CHRONULAC) 10 GM/15ML solution Take 30 mLs (20 g total) by mouth daily. 473 mL 2   potassium chloride SA (KLOR-CON M) 20 MEQ tablet TAKE 1 TABLET BY MOUTH EVERY DAY (Patient not taking: Reported on 07/30/2022) 30 tablet 0    sildenafil (VIAGRA) 100 MG tablet Take 0.5-1 tablets (50-100 mg total) by mouth daily as needed for erectile dysfunction. 5 tablet 11   spironolactone (ALDACTONE) 25 MG tablet Take 1 tablet (25 mg total) by mouth daily. (Patient not taking: Reported on 07/30/2022) 30 tablet 5   traMADol (ULTRAM) 50 MG tablet TAKE 1 TABLET(50 MG) BY MOUTH EVERY 6 HOURS AS NEEDED 30 tablet 0  No current facility-administered medications for this visit.    Allergies as of 08/10/2022   (No Known Allergies)    Social History   Socioeconomic History   Marital status: Married    Spouse name: Not on file   Number of children: Not on file   Years of education: Not on file   Highest education level: Not on file  Occupational History   Not on file  Tobacco Use   Smoking status: Every Day    Packs/day: .33    Types: Cigarettes   Smokeless tobacco: Never  Vaping Use   Vaping Use: Never used  Substance and Sexual Activity   Alcohol use: Yes    Alcohol/week: 1.0 standard drink of alcohol    Types: 1 Cans of beer per week    Comment: occasionally   Drug use: No   Sexual activity: Yes  Other Topics Concern   Not on file  Social History Narrative   Not on file   Social Determinants of Health   Financial Resource Strain: Not on file  Food Insecurity: Not on file  Transportation Needs: Not on file  Physical Activity: Not on file  Stress: Not on file  Social Connections: Not on file  Intimate Partner Violence: Not on file     Code Status   Code Status: Prior  Review of Systems: All systems reviewed and negative except where noted in HPI.  Physical Exam: Vital signs in last 24 hours: @VSRANGES @    General:  Pleasant ***male in NAD Psych:  Cooperative. Normal mood and affect Eyes: Pupils equal Ears:  Normal auditory acuity Nose: No deformity, discharge or lesions Neck:  Supple, no masses felt Lungs:  Clear to auscultation.  Heart:  Regular rate, regular rhythm.  Abdomen:  Soft,  nondistended, nontender, active bowel sounds, no masses felt Rectal :  Deferred Msk: Symmetrical without gross deformities.  Neurologic:  Alert, oriented, grossly normal neurologically Extremities : No edema Skin:  Intact without significant lesions.    Intake/Output from previous day: No intake/output data recorded. Intake/Output this shift:  @IOTHISSHIFT @     Willette Cluster, NP-C @  08/09/2022, 5:10 PM

## 2022-08-09 NOTE — Progress Notes (Deleted)
Assessment and Plan   Primary Gi:  Brief Narrative:  52 y.o. yo male with a past medical history consisting of, but not limited to   ***   History of Present Illness   Chief complaint:        Procedure risk assessment:  No history of CHF.  No supplemental 02 use at home.  Not a known difficult airway Anticoagulant:      Previous GI Endoscopies / Labs / Imaging        Latest Ref Rng & Units 07/30/2022    9:16 AM 04/30/2022    5:03 PM 03/22/2022   11:04 AM  Hepatic Function  Total Protein 6.1 - 8.1 g/dL 5.9  6.2  5.9   AST 10 - 35 U/L 36  43  31   ALT 9 - 46 U/L 18  24  16    Total Bilirubin 0.2 - 1.2 mg/dL 2.4  3.3  3.3        Latest Ref Rng & Units 07/30/2022    9:16 AM 04/30/2022    5:03 PM 03/22/2022   11:04 AM  CBC  WBC 3.8 - 10.8 Thousand/uL 4.6  4.8  3.5   Hemoglobin 13.2 - 17.1 g/dL 16.1  09.6  04.5   Hematocrit 38.5 - 50.0 % 29.1  30.9  30.1   Platelets 140 - 400 Thousand/uL 103  117  120         Past Medical History:  Diagnosis Date   Abdominal pain    Alcoholic hepatitis without ascites    Anemia    Anxiety    Arthritis    Elevated bilirubin    Essential hypertension    Psoriasis    Transaminitis     Past Surgical History:  Procedure Laterality Date   BIOPSY  07/20/2019   Procedure: BIOPSY;  Surgeon: Corbin Ade, MD;  [facility];  Service: Endoscopy;;   BIOPSY  08/14/2019   Procedure: BIOPSY;  Surgeon: Tressia Danas, MD;  Location: [facility] ENDOSCOPY;  Service: Gastroenterology;;   COLONOSCOPY     COLONOSCOPY WITH PROPOFOL N/A 07/20/2019   Procedure: COLONOSCOPY WITH PROPOFOL;  Surgeon: Corbin Ade, MD;  [facility];  Service: Endoscopy;  Laterality: N/A;  10:15am   COLONOSCOPY WITH PROPOFOL N/A 08/14/2019   Procedure: COLONOSCOPY WITH PROPOFOL;  Surgeon: Tressia Danas, MD;  Location: [facility] ENDOSCOPY;  Service: Gastroenterology;  Laterality: N/A;   IR PARACENTESIS  08/14/2019   IR  PARACENTESIS  05/02/2021   POLYPECTOMY  07/20/2019   Procedure: POLYPECTOMY;  Surgeon: Corbin Ade, MD;  [facility];  Service: Endoscopy;;   SUBMUCOSAL TATTOO INJECTION  08/14/2019   Procedure: SUBMUCOSAL TATTOO INJECTION;  Surgeon: Tressia Danas, MD;  Location: [facility] ENDOSCOPY;  Service: Gastroenterology;;    Current Medications, Allergies, Family History and Social History were reviewed in [employer] electronic medical record.     Current Outpatient Medications  Medication Sig Dispense Refill   dronabinol (MARINOL) 2.5 MG capsule Take 1 capsule (2.5 mg total) by mouth 2 (two) times daily before a meal. 60 capsule 3   feeding supplement (ENSURE ENLIVE / ENSURE PLUS) LIQD Take 237 mLs by mouth 3 (three) times daily between meals. 237 mL 12   furosemide (LASIX) 40 MG tablet Take 1 tablet (40 mg total) by mouth daily. (Patient not taking: Reported on 07/30/2022) 30 tablet 3   hydrOXYzine (VISTARIL) 25 MG capsule Take 1 capsule (25 mg total) by mouth at bedtime as  needed (sleep). 30 capsule 0   lactulose (CHRONULAC) 10 GM/15ML solution Take 30 mLs (20 g total) by mouth daily. 473 mL 2   potassium chloride SA (KLOR-CON M) 20 MEQ tablet TAKE 1 TABLET BY MOUTH EVERY DAY (Patient not taking: Reported on 07/30/2022) 30 tablet 0   sildenafil (VIAGRA) 100 MG tablet Take 0.5-1 tablets (50-100 mg total) by mouth daily as needed for erectile dysfunction. 5 tablet 11   spironolactone (ALDACTONE) 25 MG tablet Take 1 tablet (25 mg total) by mouth daily. (Patient not taking: Reported on 07/30/2022) 30 tablet 5   traMADol (ULTRAM) 50 MG tablet TAKE 1 TABLET(50 MG) BY MOUTH EVERY 6 HOURS AS NEEDED 30 tablet 0   No current facility-administered medications for this visit.    Review of Systems: No chest pain. No shortness of breath. No urinary complaints.    Physical Exam  Wt Readings from Last 3 Encounters:  07/30/22 157 lb (71.2 kg)  04/30/22 153 lb 6.4 oz (69.6 kg)  02/05/22 154 lb  (69.9 kg)    There were no vitals taken for this visit. Constitutional:  Pleasant, generally well appearing ***male in no acute distress. Psychiatric: Normal mood and affect. Behavior is normal. EENT: Pupils normal.  Conjunctivae are normal. No scleral icterus. Neck supple.  Cardiovascular: Normal rate, regular rhythm.  Pulmonary/chest: Effort normal and breath sounds normal. No wheezing, rales or rhonchi. Abdominal: Soft, nondistended, nontender. Bowel sounds active throughout. There are no masses palpable. No hepatomegaly. Neurological: Alert and oriented to person place and time.  Extremities: *** edema Skin: Skin is warm and dry. No rashes noted.  Willette Cluster, NP  08/09/2022, 4:51 PM  Cc:  Donita Brooks, MD

## 2022-08-10 ENCOUNTER — Telehealth: Payer: Self-pay

## 2022-08-10 ENCOUNTER — Ambulatory Visit: Payer: 59 | Admitting: Nurse Practitioner

## 2022-08-10 ENCOUNTER — Other Ambulatory Visit (INDEPENDENT_AMBULATORY_CARE_PROVIDER_SITE_OTHER): Payer: 59

## 2022-08-10 ENCOUNTER — Encounter: Payer: Self-pay | Admitting: Nurse Practitioner

## 2022-08-10 VITALS — BP 118/68 | HR 71 | Ht 68.0 in | Wt 152.0 lb

## 2022-08-10 DIAGNOSIS — K7469 Other cirrhosis of liver: Secondary | ICD-10-CM | POA: Diagnosis not present

## 2022-08-10 LAB — PROTIME-INR
INR: 1.5 ratio — ABNORMAL HIGH (ref 0.8–1.0)
Prothrombin Time: 15.6 s — ABNORMAL HIGH (ref 9.6–13.1)

## 2022-08-10 NOTE — Patient Instructions (Addendum)
_______________________________________________________  If your blood pressure at your visit was 140/90 or greater, please contact your primary care physician to follow up on this. _______________________________________________________  If you are age 51 or younger, your body mass index should be between 19-25. Your Body mass index is 23.11 kg/m. If this is out of the aformentioned range listed, please consider follow up with your Primary Care Provider.  ________________________________________________________  The [facility] providers would like to encourage you to use [facility] to communicate with providers for non-urgent requests or questions.  Due to long hold times on the telephone, sending your provider a message by [facility] may be a faster and more efficient way to get a response.  Please allow 48 business hours for a response.  Please remember that this is for non-urgent requests.  _______________________________________________________  Your provider has requested that you go to the basement level for lab work before leaving today. Press "B" on the elevator. The lab is located at the first door on the left as you exit the elevator.  Due to recent changes in healthcare laws, you may see the results of your imaging and laboratory studies on MyChart before your provider has had a chance to review them.  We understand that in some cases there may be results that are confusing or concerning to you. Not all laboratory results come back in the same time frame and the provider may be waiting for multiple results in order to interpret others.  Please give [place] 48 hours in order for your provider to thoroughly review all the results before contacting the office for clarification of your results.   You are scheduled to follow up in our office on 11-19-22 at 1:40pm.  Someone from [facility] office ([facility]) should be in contact with you about an appointment.  If you have not heard from them  in 1 to 2 weeks please contact our office.   Thank you for entrusting me with your care and choosing [facility].  Willette Cluster, NP

## 2022-08-10 NOTE — Telephone Encounter (Signed)
[id number] Palliative Care Note  On 07/31/22, volunteer attempted palliative care check in. Call would not go thru.  This is attempt # 1  Barbette Merino, RN

## 2022-08-10 NOTE — Progress Notes (Signed)
Assessment and Plan   Primary Gi: Previously Christopher Danas, MD. Care will now be assumed by Ileene Patrick, MD  Brief Narrative:  52 y.o. yo male with a past medical history consisting of, but not limited to Etoh cirrhosis, hypertension and anxiety  Etoh cirrhosis with history of encephalopathy, ascites, HRS and pancytopenia. MELD-Na: 12 at 07/30/2022  9:16 AM Alcohol in remission (none in a year). He is no longer on Hospice and wishes to reestablish with [facility]. He looks good today. No evidence for ascites or hepatic encephalopathy -No history of varices. Two year surveillance EGD due Dec 2024 -He will continue to monitor for any edema / ascites. Continue low-sodium diet -Continue low-dose spironolactone and furosemide as needed -Continue lactulose -Will arrange for [facility] surveillance after I discuss with Dr. Adela Lank who will be assuming his care.  Could just get an abdominal ultrasound to look for any focal liver lesions as well as to reassess splenomegaly. However, he has not had any cross-sectional imaging in over a year.  -Follow-up here in about 3 months time, sooner if needed. In the interim will refer him back to Annamarie Major, NP with [facility].   History of colon polyps.   Tubular adenomas in 2021.  Surveillance recommended 2028     History of Present Illness   Chief complaint:  cirrhosis follow up  Billey Gosling was last seen in the office 08/08/2021 for follow-up on alcoholic hepatitis.  He had improved with steroids but then unfortunately started drinking again the week prior.  We have not seen him since.  He went into hospice at some point .  He has been sober for a year and is no longer in hospice. Christopher Carrillo feels pretty good. He has occasional swelling in legs after a long walk. Takes Lasix and Aldactone as needed. Watches salt intake but doesn't strictly monitor gram / day.  PCP gave him marinol Rx for weight loss/ poor appetite but he hasn't filled  the Rx because insurance will not cover it. His weight has stabilized.  He would like to get reestablished with [facility].    His only concern today is about bleeding from gums. Also bleeds from ears when using qtips.   Labs by PCP on 07/30/2022 show stable bili at 2.4.  INR 1.5 . His renal function is normal.  White count is normal , hemoglobin stable at 10.4 .  Platelets stable at 103K.      Latest Ref Rng & Units 07/30/2022    9:16 AM 04/30/2022    5:03 PM 03/22/2022   11:04 AM  Hepatic Function  Total Protein 6.1 - 8.1 g/dL 5.9  6.2  5.9   AST 10 - 35 U/L 36  43  31   ALT 9 - 46 U/L 18  24  16    Total Bilirubin 0.2 - 1.2 mg/dL 2.4  3.3  3.3        Latest Ref Rng & Units 07/30/2022    9:16 AM 04/30/2022    5:03 PM 03/22/2022   11:04 AM  CBC  WBC 3.8 - 10.8 Thousand/uL 4.6  4.8  3.5   Hemoglobin 13.2 - 17.1 g/dL 16.1  09.6  04.5   Hematocrit 38.5 - 50.0 % 29.1  30.9  30.1   Platelets 140 - 400 Thousand/uL 103  117  120     Past Medical History:  Diagnosis Date   Abdominal pain    Alcoholic hepatitis without ascites  Anemia    Anxiety    Arthritis    Elevated bilirubin    Essential hypertension    Psoriasis    Transaminitis     Past Surgical History:  Procedure Laterality Date   BIOPSY  07/20/2019   Procedure: BIOPSY;  Surgeon: Corbin Ade, MD;  [facility];  Service: Endoscopy;;   BIOPSY  08/14/2019   Procedure: BIOPSY;  Surgeon: Christopher Danas, MD;  Location: [facility] ENDOSCOPY;  Service: Gastroenterology;;   COLONOSCOPY     COLONOSCOPY WITH PROPOFOL N/A 07/20/2019   Procedure: COLONOSCOPY WITH PROPOFOL;  Surgeon: Corbin Ade, MD;  [facility];  Service: Endoscopy;  Laterality: N/A;  10:15am   COLONOSCOPY WITH PROPOFOL N/A 08/14/2019   Procedure: COLONOSCOPY WITH PROPOFOL;  Surgeon: Christopher Danas, MD;  Location: [facility] ENDOSCOPY;  Service: Gastroenterology;  Laterality: N/A;   IR PARACENTESIS  08/14/2019   IR  PARACENTESIS  05/02/2021   POLYPECTOMY  07/20/2019   Procedure: POLYPECTOMY;  Surgeon: Corbin Ade, MD;  [facility];  Service: Endoscopy;;   SUBMUCOSAL TATTOO INJECTION  08/14/2019   Procedure: SUBMUCOSAL TATTOO INJECTION;  Surgeon: Christopher Danas, MD;  Location: [facility] ENDOSCOPY;  Service: Gastroenterology;;    Current Medications, Allergies, Family History and Social History were reviewed in [employer] electronic medical record.     Current Outpatient Medications  Medication Sig Dispense Refill   dronabinol (MARINOL) 2.5 MG capsule Take 1 capsule (2.5 mg total) by mouth 2 (two) times daily before a meal. 60 capsule 3   feeding supplement (ENSURE ENLIVE / ENSURE PLUS) LIQD Take 237 mLs by mouth 3 (three) times daily between meals. 237 mL 12   lactulose (CHRONULAC) 10 GM/15ML solution Take 30 mLs (20 g total) by mouth daily. 473 mL 2   sildenafil (VIAGRA) 100 MG tablet Take 0.5-1 tablets (50-100 mg total) by mouth daily as needed for erectile dysfunction. 5 tablet 11   furosemide (LASIX) 40 MG tablet Take 1 tablet (40 mg total) by mouth daily. (Patient not taking: Reported on 08/10/2022) 30 tablet 3   hydrOXYzine (VISTARIL) 25 MG capsule Take 1 capsule (25 mg total) by mouth at bedtime as needed (sleep). (Patient not taking: Reported on 08/10/2022) 30 capsule 0   potassium chloride SA (KLOR-CON M) 20 MEQ tablet TAKE 1 TABLET BY MOUTH EVERY DAY (Patient not taking: Reported on 08/10/2022) 30 tablet 0   spironolactone (ALDACTONE) 25 MG tablet Take 1 tablet (25 mg total) by mouth daily. (Patient not taking: Reported on 08/10/2022) 30 tablet 5   traMADol (ULTRAM) 50 MG tablet TAKE 1 TABLET(50 MG) BY MOUTH EVERY 6 HOURS AS NEEDED (Patient not taking: Reported on 08/10/2022) 30 tablet 0   No current facility-administered medications for this visit.    Review of Systems: No chest pain. No shortness of breath. No urinary complaints.    Physical Exam  Wt Readings from Last 3  Encounters:  08/10/22 152 lb (68.9 kg)  07/30/22 157 lb (71.2 kg)  04/30/22 153 lb 6.4 oz (69.6 kg)    BP 118/68   Pulse 71   Ht 5\' 8"  (1.727 m)   Wt 152 lb (68.9 kg)   BMI 23.11 kg/m  Constitutional:  Pleasant, generally well appearing male in no acute distress. Psychiatric: Normal mood and affect. Behavior is normal. EENT: Pupils normal.  Conjunctivae are normal. No scleral icterus. Neck supple.  Cardiovascular: Normal rate, regular rhythm.  Pulmonary/chest: Effort normal and breath sounds normal. No wheezing, rales or rhonchi.  Abdominal: Soft, nondistended, nontender. Bowel sounds active throughout. There are no masses palpable. No hepatomegaly. Neurological: Alert and oriented to person place and time.  No asterixis.  Extremities: no edema Skin: Skin is warm and dry. No rashes noted.  Willette Cluster, NP  08/10/2022, 2:57 PM  Cc:  Donita Brooks, MD

## 2022-08-10 NOTE — Progress Notes (Signed)
Glad to hear he is doing better.  I recommend we start with a right upper quadrant ultrasound and AFP.  If anything concerning on that we can then move onto an MRI of his liver or other cross-sectional imaging.  Thanks

## 2022-08-14 ENCOUNTER — Other Ambulatory Visit: Payer: Self-pay

## 2022-08-14 ENCOUNTER — Telehealth: Payer: Self-pay

## 2022-08-14 DIAGNOSIS — K7469 Other cirrhosis of liver: Secondary | ICD-10-CM

## 2022-08-14 NOTE — Telephone Encounter (Signed)
-----   Message from Christopher Pel, NP sent at 08/13/2022  5:36 PM EDT ----- Christopher Carrillo, would you please call Christopher Carrillo and tell him that I discussed his case with Dr. Adela Lank who is going to be assuming care now that Dr. Orvan Falconer is gone..  I told him the good news that Christopher Carrillo was out of hospice and getting his care back on track.  We are in the process of referring him back to [facility] but in the meantime Christopher Carrillo would you arrange for him to have an RUQ ultrasound for hepatocellular cancer screening and also come in for an AFP for hepatocellular screening.  Thanks

## 2022-08-14 NOTE — Telephone Encounter (Signed)
Called the patient. No answer. Left information on his voicemail. Also sent a message through My Chart.

## 2022-08-16 NOTE — Telephone Encounter (Signed)
Called the patient. No answer. Left message of my call. 

## 2022-08-17 ENCOUNTER — Other Ambulatory Visit: Payer: Self-pay | Admitting: Nurse Practitioner

## 2022-08-17 DIAGNOSIS — K7030 Alcoholic cirrhosis of liver without ascites: Secondary | ICD-10-CM

## 2022-08-17 DIAGNOSIS — F1091 Alcohol use, unspecified, in remission: Secondary | ICD-10-CM | POA: Diagnosis not present

## 2022-08-17 DIAGNOSIS — K766 Portal hypertension: Secondary | ICD-10-CM | POA: Diagnosis not present

## 2022-08-17 DIAGNOSIS — K7682 Hepatic encephalopathy: Secondary | ICD-10-CM | POA: Diagnosis not present

## 2022-08-17 DIAGNOSIS — K3189 Other diseases of stomach and duodenum: Secondary | ICD-10-CM | POA: Diagnosis not present

## 2022-08-17 NOTE — Telephone Encounter (Signed)
Attempted to reach the patient by phone. No answer. Did not leave a message. Review of his chart shows he was seen by Hepatology today. Ultrasound and labs have been ordered by Annamarie Major, CRNP.

## 2022-08-17 NOTE — Telephone Encounter (Signed)
Great, thanks Beth 

## 2022-09-12 ENCOUNTER — Telehealth: Payer: Self-pay

## 2022-09-12 NOTE — Telephone Encounter (Signed)
1508 Palliative Care Note  Volunteer attempted to contact pt. For palliative check in. Automated message received stating "call can not be completed."  Barbette Merino, RN

## 2022-09-24 ENCOUNTER — Ambulatory Visit: Payer: 59 | Admitting: Gastroenterology

## 2022-09-24 ENCOUNTER — Telehealth: Payer: Self-pay

## 2022-09-24 NOTE — Telephone Encounter (Signed)
155 pm.  Phone call made to patient to complete a telephonic check in.  No answer.  Message left requesting a call back.

## 2022-09-28 ENCOUNTER — Ambulatory Visit
Admission: RE | Admit: 2022-09-28 | Discharge: 2022-09-28 | Disposition: A | Discharge status: Home / Self Care | Payer: 59 | Source: Ambulatory Visit | Attending: Nurse Practitioner | Admitting: Nurse Practitioner

## 2022-09-28 DIAGNOSIS — K769 Liver disease, unspecified: Secondary | ICD-10-CM | POA: Diagnosis not present

## 2022-09-28 DIAGNOSIS — K8020 Calculus of gallbladder without cholecystitis without obstruction: Secondary | ICD-10-CM | POA: Diagnosis not present

## 2022-09-28 DIAGNOSIS — K7030 Alcoholic cirrhosis of liver without ascites: Secondary | ICD-10-CM

## 2022-11-13 ENCOUNTER — Ambulatory Visit: Payer: 59 | Admitting: Family Medicine

## 2022-11-13 ENCOUNTER — Encounter: Payer: Self-pay | Admitting: Family Medicine

## 2022-11-13 VITALS — BP 112/52 | HR 71 | Temp 98.7°F | Ht 68.0 in | Wt 156.0 lb

## 2022-11-13 DIAGNOSIS — R509 Fever, unspecified: Secondary | ICD-10-CM | POA: Diagnosis not present

## 2022-11-13 DIAGNOSIS — W57XXXA Bitten or stung by nonvenomous insect and other nonvenomous arthropods, initial encounter: Secondary | ICD-10-CM | POA: Diagnosis not present

## 2022-11-13 LAB — CBC WITH DIFFERENTIAL/PLATELET
Absolute Monocytes: 1411 cells/uL — ABNORMAL HIGH (ref 200–950)
Basophils Absolute: 43 cells/uL (ref 0–200)
Basophils Relative: 0.5 %
Eosinophils Absolute: 196 cells/uL (ref 15–500)
Eosinophils Relative: 2.3 %
HCT: 30 % — ABNORMAL LOW (ref 38.5–50.0)
Hemoglobin: 10.9 g/dL — ABNORMAL LOW (ref 13.2–17.1)
Lymphs Abs: 1913 cells/uL (ref 850–3900)
MCH: 31.1 pg (ref 27.0–33.0)
MCHC: 36.3 g/dL — ABNORMAL HIGH (ref 32.0–36.0)
MCV: 85.7 fL (ref 80.0–100.0)
MPV: 11.6 fL (ref 7.5–12.5)
Monocytes Relative: 16.6 %
Neutro Abs: 4939 cells/uL (ref 1500–7800)
Neutrophils Relative %: 58.1 %
Platelets: 115 10*3/uL — ABNORMAL LOW (ref 140–400)
RBC: 3.5 10*6/uL — ABNORMAL LOW (ref 4.20–5.80)
RDW: 13.8 % (ref 11.0–15.0)
Total Lymphocyte: 22.5 %
WBC: 8.5 10*3/uL (ref 3.8–10.8)

## 2022-11-13 MED ORDER — DOXYCYCLINE HYCLATE 100 MG PO TABS
100.0000 mg | ORAL_TABLET | Freq: Two times a day (BID) | ORAL | 0 refills | Status: AC
Start: 2022-11-13 — End: ?

## 2022-11-13 MED ORDER — LACTULOSE 10 GM/15ML PO SOLN: 20.0000 g | Freq: Every day | ORAL | 2 refills | Status: DC

## 2022-11-13 MED ORDER — CYCLOBENZAPRINE HCL 10 MG PO TABS: 10.0000 mg | ORAL_TABLET | Freq: Three times a day (TID) | ORAL | 0 refills | Status: DC | PRN

## 2022-11-13 MED ORDER — TRIAMCINOLONE ACETONIDE 0.1 % EX CREA: 1.0000 | TOPICAL_CREAM | Freq: Two times a day (BID) | CUTANEOUS | 0 refills | Status: AC

## 2022-11-13 NOTE — Progress Notes (Signed)
Subjective:    Patient ID: Christopher Carrillo, male    DOB: 1970/06/30, 52 y.o.   MRN: [id number]  HPI Patient presents today stating that he just feels sick.  1 week ago, he developed nausea and vomiting.  That lasted about 24 hours and then subsided.  Since that time he reports diffuse bodyaches.  He states that he hurts in his neck, his head, shoulders, his hands, and his knees.  The neck stiffness and the headache began 2 days ago.  He reports subjective fevers.  He denies any cough.  He denies any sore throat.  He denies any sinus pain.  He denies any diarrhea constipation.  He denies any dysuria.  He denies any rash other than psoriasis on his elbows.  He has had tick bites recently Past Medical History:  Diagnosis Date   Abdominal pain    Alcoholic hepatitis without ascites    Anemia    Anxiety    Arthritis    Elevated bilirubin    Essential hypertension    Psoriasis    Transaminitis    Past Surgical History:  Procedure Laterality Date   BIOPSY  07/20/2019   Procedure: BIOPSY;  Surgeon: Corbin Ade, MD;  [facility];  Service: Endoscopy;;   BIOPSY  08/14/2019   Procedure: BIOPSY;  Surgeon: Tressia Danas, MD;  Location: [facility] ENDOSCOPY;  Service: Gastroenterology;;   COLONOSCOPY     COLONOSCOPY WITH PROPOFOL N/A 07/20/2019   Procedure: COLONOSCOPY WITH PROPOFOL;  Surgeon: Corbin Ade, MD;  [facility];  Service: Endoscopy;  Laterality: N/A;  10:15am   COLONOSCOPY WITH PROPOFOL N/A 08/14/2019   Procedure: COLONOSCOPY WITH PROPOFOL;  Surgeon: Tressia Danas, MD;  Location: [facility] ENDOSCOPY;  Service: Gastroenterology;  Laterality: N/A;   IR PARACENTESIS  08/14/2019   IR PARACENTESIS  05/02/2021   POLYPECTOMY  07/20/2019   Procedure: POLYPECTOMY;  Surgeon: Corbin Ade, MD;  [facility];  Service: Endoscopy;;   SUBMUCOSAL TATTOO INJECTION  08/14/2019   Procedure: SUBMUCOSAL TATTOO INJECTION;  Surgeon: Tressia Danas, MD;   Location: [facility] ENDOSCOPY;  Service: Gastroenterology;;   Current Outpatient Medications on File Prior to Visit  Medication Sig Dispense Refill   dronabinol (MARINOL) 2.5 MG capsule Take 1 capsule (2.5 mg total) by mouth 2 (two) times daily before a meal. 60 capsule 3   feeding supplement (ENSURE ENLIVE / ENSURE PLUS) LIQD Take 237 mLs by mouth 3 (three) times daily between meals. 237 mL 12   furosemide (LASIX) 40 MG tablet Take 1 tablet (40 mg total) by mouth daily. (Patient not taking: Reported on 08/10/2022) 30 tablet 3   hydrOXYzine (VISTARIL) 25 MG capsule Take 1 capsule (25 mg total) by mouth at bedtime as needed (sleep). (Patient not taking: Reported on 08/10/2022) 30 capsule 0   lactulose (CHRONULAC) 10 GM/15ML solution Take 30 mLs (20 g total) by mouth daily. 473 mL 2   potassium chloride SA (KLOR-CON M) 20 MEQ tablet TAKE 1 TABLET BY MOUTH EVERY DAY (Patient not taking: Reported on 08/10/2022) 30 tablet 0   sildenafil (VIAGRA) 100 MG tablet Take 0.5-1 tablets (50-100 mg total) by mouth daily as needed for erectile dysfunction. 5 tablet 11   spironolactone (ALDACTONE) 25 MG tablet Take 1 tablet (25 mg total) by mouth daily. (Patient not taking: Reported on 08/10/2022) 30 tablet 5   traMADol (ULTRAM) 50 MG tablet TAKE 1 TABLET(50 MG) BY MOUTH EVERY 6 HOURS AS NEEDED (Patient not taking: Reported on  08/10/2022) 30 tablet 0   No current facility-administered medications on file prior to visit.   No Known Allergies  Social History   Socioeconomic History   Marital status: Married    Spouse name: Not on file   Number of children: Not on file   Years of education: Not on file   Highest education level: GED or equivalent  Occupational History   Not on file  Tobacco Use   Smoking status: Every Day    Current packs/day: 0.33    Types: Cigarettes   Smokeless tobacco: Never  Vaping Use   Vaping status: Never Used  Substance and Sexual Activity   Alcohol use: Yes    Alcohol/week: 1.0 standard  drink of alcohol    Types: 1 Cans of beer per week    Comment: occasionally   Drug use: No   Sexual activity: Yes  Other Topics Concern   Not on file  Social History Narrative   Not on file   Social Determinants of Health   Financial Resource Strain: High Risk (11/12/2022)   Overall Financial Resource Strain (CARDIA)    Difficulty of Paying Living Expenses: Hard  Food Insecurity: No Food Insecurity (11/12/2022)   Hunger Vital Sign    Worried About Running Out of Food in the Last Year: Never true    Ran Out of Food in the Last Year: Never true  Transportation Needs: No Transportation Needs (11/12/2022)   PRAPARE - Administrator, Civil Service (Medical): No    Lack of Transportation (Non-Medical): No  Physical Activity: Insufficiently Active (11/12/2022)   Exercise Vital Sign    Days of Exercise per Week: 4 days    Minutes of Exercise per Session: 30 min  Stress: No Stress Concern Present (11/12/2022)   [employer] of Occupational Health - Occupational Stress Questionnaire    Feeling of Stress : Only a little  Social Connections: Unknown (11/12/2022)   Social Connection and Isolation Panel [NHANES]    Frequency of Communication with Friends and Family: Once a week    Frequency of Social Gatherings with Friends and Family: Not on file    Attends Religious Services: Not on file    Active Member of Clubs or Organizations: No    Attends Banker Meetings: Not on file    Marital Status: Not on file  Intimate Partner Violence: Not on file     Review of Systems     Objective:   Physical Exam Vitals reviewed.  Constitutional:      General: He is not in acute distress.    Appearance: Normal appearance. He is normal weight. He is not ill-appearing, toxic-appearing or diaphoretic.  HENT:     Right Ear: Tympanic membrane and ear canal normal.     Left Ear: Tympanic membrane and ear canal normal.     Nose: No congestion or rhinorrhea.     Mouth/Throat:      Pharynx: No oropharyngeal exudate or posterior oropharyngeal erythema.  Eyes:     General: No scleral icterus. Cardiovascular:     Rate and Rhythm: Normal rate and regular rhythm.     Heart sounds: Normal heart sounds. No murmur heard.    No friction rub. No gallop.  Pulmonary:     Effort: Pulmonary effort is normal. No respiratory distress.     Breath sounds: Normal breath sounds. No stridor. No wheezing, rhonchi or rales.  Abdominal:     General: Abdomen is flat. Bowel sounds are normal.  There is no distension.     Palpations: Abdomen is soft.     Tenderness: There is no abdominal tenderness. There is no guarding or rebound.  Musculoskeletal:     Cervical back: No tenderness.     Right lower leg: No edema.     Left lower leg: No edema.  Lymphadenopathy:     Cervical: No cervical adenopathy.  Skin:    Coloration: Skin is not jaundiced or pale.     Findings: Rash present.  Neurological:     General: No focal deficit present.     Mental Status: He is alert and oriented to person, place, and time. Mental status is at baseline.  Psychiatric:        Attention and Perception: Attention normal.        Mood and Affect: Mood is not depressed. Affect is not tearful.        Speech: Speech normal.        Behavior: Behavior normal.        Thought Content: Thought content normal.        Cognition and Memory: Cognition and memory normal.           Assessment & Plan:  Fever, unspecified fever cause - Plan: CBC with Differential/Platelet, COMPLETE METABOLIC PANEL WITH GFR, B. burgdorfi antibodies by WB, Rocky mtn spotted fvr abs pnl(IgG+IgM)  Tick bite, unspecified site, initial encounter - Plan: CBC with Differential/Platelet, COMPLETE METABOLIC PANEL WITH GFR, B. burgdorfi antibodies by WB, Rocky mtn spotted fvr abs pnl(IgG+IgM) Most likely the patient had a viral gastroenteritis and is now suffering from a postviral syndrome.  I recommended Tylenol as needed for body aches.  Patient  needs to avoid NSAIDs due to his cirrhosis.  Therefore I recommend using a muscle relaxer for the headaches and neck pain.  However given the recent tick bites, I have recommended checking tick titers and starting the patient empirically on doxycycline until the tick titers return.

## 2022-11-15 ENCOUNTER — Telehealth: Payer: Self-pay

## 2022-11-15 ENCOUNTER — Other Ambulatory Visit: Payer: Self-pay | Admitting: Family Medicine

## 2022-11-15 DIAGNOSIS — R0789 Other chest pain: Secondary | ICD-10-CM

## 2022-11-15 NOTE — Telephone Encounter (Signed)
Pt c/o increasing rib cage pain. Pt states it started last night and he was so uncomfortable, he was unable to rest. Pt asks if there is any way he can have a pain medication, mainly for night time. Pt states the muscle relaxer's are not really helping. Thank you.

## 2022-11-19 ENCOUNTER — Ambulatory Visit: Payer: 59 | Admitting: Gastroenterology

## 2022-11-19 ENCOUNTER — Encounter: Payer: Self-pay | Admitting: Gastroenterology

## 2022-11-19 ENCOUNTER — Other Ambulatory Visit (INDEPENDENT_AMBULATORY_CARE_PROVIDER_SITE_OTHER): Payer: 59

## 2022-11-19 VITALS — BP 94/48 | HR 80 | Ht 68.0 in | Wt 154.5 lb

## 2022-11-19 DIAGNOSIS — D649 Anemia, unspecified: Secondary | ICD-10-CM

## 2022-11-19 DIAGNOSIS — K7030 Alcoholic cirrhosis of liver without ascites: Secondary | ICD-10-CM

## 2022-11-19 DIAGNOSIS — K7469 Other cirrhosis of liver: Secondary | ICD-10-CM | POA: Diagnosis not present

## 2022-11-19 DIAGNOSIS — K7682 Hepatic encephalopathy: Secondary | ICD-10-CM

## 2022-11-19 DIAGNOSIS — F1091 Alcohol use, unspecified, in remission: Secondary | ICD-10-CM | POA: Diagnosis not present

## 2022-11-19 LAB — IBC + FERRITIN
Ferritin: 151 ng/mL (ref 22.0–322.0)
Iron: 99 ug/dL (ref 42–165)
Saturation Ratios: 36.8 % (ref 20.0–50.0)
TIBC: 268.8 ug/dL (ref 250.0–450.0)
Transferrin: 192 mg/dL — ABNORMAL LOW (ref 212.0–360.0)

## 2022-11-19 LAB — TSH: TSH: 1.69 u[IU]/mL (ref 0.35–5.50)

## 2022-11-19 LAB — TESTOSTERONE: Testosterone: 611.53 ng/dL (ref 300.00–890.00)

## 2022-11-19 NOTE — Progress Notes (Signed)
HPI :  52 year old male here for a follow-up visit for cirrhosis.  He previously followed with Dr. Orvan Falconer, I assumed his care in her absence.  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  I reviewed his history with him.  He does endorse alcohol use in the past but does not agree that his cirrhosis is due to alcohol.  Recall the patient was admitted to [facility] in April 2021 with acute alcohol associated hepatitis. He underwent percutaneous liver biopsy on 08/03/2019 with pathology showing moderately active steatohepatitis, grade 2 of 3, advanced fibrosis with at least stage 3 of 4 fibrosis, well-formed Mallory bodies, and diffuse hepatocellular/canalicular cholestasis with mild lymphocytic inflammation.   He was admitted to [facility] in January 2023 with hepatorenal syndrome and acute on chronic alcohol associated hepatitis and had made the decision to be discharged on hospice.  He recovered with supportive measures, has been plugged in with the transplant clinic and is undergoing evaluation with [facility].  He has been doing really well since we have last seen him.  He is taking lactulose daily that is controlling hepatic encephalopathy and he denies any issues with that.  He states he actually does not use lactulose every day but he does try to use it most days of the week.  He has been on Lasix and Aldactone in the past for fluid retention, he is no longer needing to use those daily, only uses as needed.  No paracentesis since he is last been seen.  He has not used any alcohol since he last been seen.  Had negative PETH test in May 2024 at Hepatology office  Most recent EGD in December 2022 with no varices but did show portal hypertensive gastropathy.   Of note he has had a normocytic anemia for the past few years.  Hemoglobin ranging in the tens with normal MCV.  Prior workup: EGD  03/09/21: - The examined esophagus was normal. The z-line is located 42 cm from the incisors. No esophageal varices seen. Findings: - Moderate portal hypertensive gastropathy was found in the gastric body. No gastric varices. - Diffuse mild inflammation characterized by erythema, friability and granularity was found in the gastric antrum. Biopsies were taken from the antrum, body, and fundus with a cold forceps for histology. Estimated blood loss was minimal. - Diffuse mildly erythematous mucosa without active bleeding and with no stigmata of bleeding was found in the duodenal bulb and in the first portion of the duodenum. Biopsies were taken with a cold forceps for histology. Estimated blood loss was minimal. - The cardia and gastric fundus were normal on retroflexion. - The exam was otherwise without abnormality.   RUQ [place] 09/29/22: IMPRESSION: 1. Cholelithiasis. The gallbladder is otherwise normal. 2. Diffuse increased echogenicity throughout the liver is nonspecific and may be seen with hepatic steatosis or other underlying intrinsic liver disease. No liver mass identified. 3. Trace perihepatic fluid.   Tubular adenomas in 2021.  Surveillance recommended 2028      Past Medical History:  Diagnosis Date   Abdominal pain    Alcoholic hepatitis without ascites    Anemia    Anxiety    Arthritis    Elevated bilirubin    Essential hypertension    Psoriasis    Transaminitis      Past Surgical History:  Procedure Laterality Date   BIOPSY  07/20/2019   Procedure: BIOPSY;  Surgeon: Corbin Ade, MD;  [facility];  Service: Endoscopy;;   BIOPSY  08/14/2019   Procedure: BIOPSY;  Surgeon: Tressia Danas, MD;  Location: [facility] ENDOSCOPY;  Service: Gastroenterology;;   COLONOSCOPY     COLONOSCOPY WITH PROPOFOL N/A 07/20/2019   Procedure: COLONOSCOPY WITH PROPOFOL;  Surgeon: Corbin Ade, MD;  [facility];  Service: Endoscopy;  Laterality: N/A;  10:15am    COLONOSCOPY WITH PROPOFOL N/A 08/14/2019   Procedure: COLONOSCOPY WITH PROPOFOL;  Surgeon: Tressia Danas, MD;  Location: [facility] ENDOSCOPY;  Service: Gastroenterology;  Laterality: N/A;   IR PARACENTESIS  08/14/2019   IR PARACENTESIS  05/02/2021   POLYPECTOMY  07/20/2019   Procedure: POLYPECTOMY;  Surgeon: Corbin Ade, MD;  [facility];  Service: Endoscopy;;   SUBMUCOSAL TATTOO INJECTION  08/14/2019   Procedure: SUBMUCOSAL TATTOO INJECTION;  Surgeon: Tressia Danas, MD;  Location: [facility] ENDOSCOPY;  Service: Gastroenterology;;   Family History  Problem Relation Age of Onset   Asthma Father    Cancer Sister 65       Pharyngeal   Colon cancer Neg Hx    Liver disease Neg Hx    Esophageal cancer Neg Hx    Rectal cancer Neg Hx    Stomach cancer Neg Hx    Social History   Tobacco Use   Smoking status: Former    Current packs/day: 0.33    Types: Cigarettes   Smokeless tobacco: Never  Vaping Use   Vaping status: Never Used  Substance Use Topics   Alcohol use: Not Currently    Comment: havent had anything since  early 2022   Drug use: No   Current Outpatient Medications  Medication Sig Dispense Refill   cyclobenzaprine (FLEXERIL) 10 MG tablet Take 1 tablet (10 mg total) by mouth 3 (three) times daily as needed for muscle spasms. 30 tablet 0   doxycycline (VIBRA-TABS) 100 MG tablet Take 1 tablet (100 mg total) by mouth 2 (two) times daily. 14 tablet 0   dronabinol (MARINOL) 2.5 MG capsule Take 1 capsule (2.5 mg total) by mouth 2 (two) times daily before a meal. 60 capsule 3   feeding supplement (ENSURE ENLIVE / ENSURE PLUS) LIQD Take 237 mLs by mouth 3 (three) times daily between meals. 237 mL 12   furosemide (LASIX) 40 MG tablet Take 1 tablet (40 mg total) by mouth daily. 30 tablet 3   hydrOXYzine (VISTARIL) 25 MG capsule Take 1 capsule (25 mg total) by mouth at bedtime as needed (sleep). 30 capsule 0   lactulose (CHRONULAC) 10 GM/15ML solution Take 30 mLs (20 g total)  by mouth daily. 473 mL 2   potassium chloride SA (KLOR-CON M) 20 MEQ tablet TAKE 1 TABLET BY MOUTH EVERY DAY 30 tablet 0   sildenafil (VIAGRA) 100 MG tablet Take 0.5-1 tablets (50-100 mg total) by mouth daily as needed for erectile dysfunction. 5 tablet 11   spironolactone (ALDACTONE) 25 MG tablet Take 1 tablet (25 mg total) by mouth daily. 30 tablet 5   traMADol (ULTRAM) 50 MG tablet TAKE 1 TABLET(50 MG) BY MOUTH EVERY 6 HOURS AS NEEDED 30 tablet 0   triamcinolone cream (KENALOG) 0.1 % Apply 1 Application topically 2 (two) times daily. 30 g 0   No current facility-administered medications for this visit.   No Known Allergies   Review of Systems: All systems reviewed and negative except where noted in HPI.   Lab Results  Component Value Date   WBC 8.5 11/13/2022   HGB 10.9 (L) 11/13/2022  HCT 30.0 (L) 11/13/2022   MCV 85.7 11/13/2022   PLT 115 (L) 11/13/2022    Lab Results  Component Value Date   NA 138 11/13/2022   CL 110 11/13/2022   K 4.3 11/13/2022   CO2 20 11/13/2022   BUN 25 11/13/2022   CREATININE 1.18 11/13/2022   EGFR 75 11/13/2022   CALCIUM 8.5 (L) 11/13/2022   PHOS 2.7 09/28/2020   ALBUMIN 3.9 06/22/2021   ALBUMIN 3.9 06/22/2021   GLUCOSE 104 (H) 11/13/2022    Lab Results  Component Value Date   ALT 20 11/13/2022   AST 24 11/13/2022   GGT 236 (H) 07/31/2019   ALKPHOS 288 (H) 06/22/2021   ALKPHOS 288 (H) 06/22/2021   BILITOT 2.6 (H) 11/13/2022    Lab Results  Component Value Date   INR 1.5 (H) 08/10/2022   INR 1.2 (H) 07/30/2022   INR 1.4 (H) 10/31/2021   Computed MELD 3.0 unavailable. One or more values for this score either were not found within the given timeframe or did not fit some other criterion. MELD-Na: 12 at 07/30/2022  9:16 AM Calculated from: Serum Creatinine: 1.03 mg/dL at 2/44/0102  7:25 AM Serum Sodium: 142 mmol/L (Using max of 137 mmol/L) at 07/30/2022  9:16 AM Total Bilirubin: 2.4 mg/dL at 3/66/4403  4:74 AM INR(ratio): 1.2 at  07/30/2022  9:16 AM    Physical Exam: BP (!) 94/48   Pulse 80   Ht 5\' 8"  (1.727 m)   Wt 154 lb 8 oz (70.1 kg)   BMI 23.49 kg/m  Constitutional: Pleasant,well-developed, male in no acute distress. Neurological: Alert and oriented to person place and time. Psychiatric: Normal mood and affect. Behavior is normal.   ASSESSMENT: 52 y.o. male here for assessment of the following  1. Other cirrhosis of liver (HCC)   2. Encephalopathy, hepatic (HCC)   3. Anemia, unspecified type    Biopsy-proven alcohol related liver disease with cirrhosis, course complicated by hepatic encephalopathy and ascites in the past.  He has been off all alcohol.  He has really improved in the past few years, previously was on hospice as previously noted.  He has now plugged in with hepatology clinic and undergoing transplant evaluation should he need it.  He has maintained abstinence from alcohol.  We discussed cirrhosis, risks for decompensation and HCC moving forward.  HCC screening is up-to-date, due for another ultrasound in December.  He is also due for labs in November.  He should continue lactulose every day, continue diuretics if needed.  He is due for an EGD in the [facility] in December for varices screening.  We discussed EGD, the procedure and risks of that and anesthesia, he wants to proceed.  He does endorse taking a long time to wake up from anesthesia in the past and states he was hospitalized post colonoscopy in the past when he was sick with his liver disease.  He tolerated his EGD well from review of the notes the last exam.  Otherwise noted to have a normocytic anemia, we will send to the lab for further evaluation of that.  Also check testosterone level.  He agrees   PLAN: - schedule EGD at the [facility] in December for varices screening - continue lactulose - continue lasix / aldactone PRN - recall RUQ [place] in December - labs in November Dawn Drazek - labs for anemia - TIBC / ferritin panel, TSH, and  testosterone level - f/u every 6 months with either [place] or Hepatology clinic  Brett Canales  Adela Lank, MD [facility] Gastroenterology

## 2022-11-19 NOTE — Patient Instructions (Addendum)
You have been scheduled for an endoscopy. Please follow written instructions given to you at your visit today.  If you use inhalers (even only as needed), please bring them with you on the day of your procedure.  If you take any of the following medications, they will need to be adjusted prior to your procedure:   DO NOT TAKE 7 DAYS PRIOR TO TEST- Trulicity (dulaglutide) Ozempic, Wegovy (semaglutide) Mounjaro (tirzepatide) Bydureon Bcise (exanatide extended release)  DO NOT TAKE 1 DAY PRIOR TO YOUR TEST Rybelsus (semaglutide) Adlyxin (lixisenatide) Victoza (liraglutide) Byetta (exanatide) _____________________________________________________________  Please go to the lab in the basement of our building to have lab work done as you leave today. Hit "B" for basement when you get on the elevator.  When the doors open the lab is on your left.  We will call you with the results. Thank you.  You will be due for a ultrasound of your liver in December.  We will remind you when it is time to schedule.  Thank you for entrusting me with your care and for choosing [facility], Dr. Ileene Patrick    If your blood pressure at your visit was 140/90 or greater, please contact your primary care physician to follow up on this. ______________________________________________________  If you are age 37 or older, your body mass index should be between 23-30. Your Body mass index is 23.49 kg/m. If this is out of the aforementioned range listed, please consider follow up with your Primary Care Provider.  If you are age 88 or younger, your body mass index should be between 19-25. Your Body mass index is 23.49 kg/m. If this is out of the aformentioned range listed, please consider follow up with your Primary Care Provider.  ________________________________________________________  The [facility] providers would like to encourage you to use [facility] to communicate with providers for non-urgent  requests or questions.  Due to long hold times on the telephone, sending your provider a message by [facility] may be a faster and more efficient way to get a response.  Please allow 48 business hours for a response.  Please remember that this is for non-urgent requests.  _______________________________________________________  Due to recent changes in healthcare laws, you may see the results of your imaging and laboratory studies on MyChart before your provider has had a chance to review them.  We understand that in some cases there may be results that are confusing or concerning to you. Not all laboratory results come back in the same time frame and the provider may be waiting for multiple results in order to interpret others.  Please give [place] 48 hours in order for your provider to thoroughly review all the results before contacting the office for clarification of your results.

## 2023-02-02 ENCOUNTER — Other Ambulatory Visit: Payer: Self-pay | Admitting: Family Medicine

## 2023-02-04 NOTE — Telephone Encounter (Signed)
Requested Prescriptions  Pending Prescriptions Disp Refills   lactulose (CHRONULAC) 10 GM/15ML solution [Pharmacy Med Name: LACTULOSE 10 GM/15 ML SOLUTION] 473 mL 1    Sig: TAKE 30 MLS (20 G TOTAL) BY MOUTH DAILY.     Gastroenterology:  Laxatives - lactulose Failed - 02/02/2023 10:30 AM      Failed - Valid encounter within last 12 months    Recent Outpatient Visits           1 year ago Alcoholic cirrhosis of liver without ascites (HCC)   [facility] Pickard, Priscille Heidelberg, MD   1 year ago Alcoholic hepatitis without ascites   [facility] Tanya Nones, Priscille Heidelberg, MD   1 year ago Alcoholic hepatitis without ascites   [facility] Donita Brooks, MD   1 year ago Dark urine   [facility] Pickard, Priscille Heidelberg, MD   1 year ago Alcoholic cirrhosis, unspecified whether ascites present [facility])   [facility] Pickard, Priscille Heidelberg, MD              Passed - Cl in normal range and within 360 days    Chloride  Date Value Ref Range Status  11/13/2022 110 98 - 110 mmol/L Final         Passed - CO2 in normal range and within 360 days    CO2  Date Value Ref Range Status  11/13/2022 20 20 - 32 mmol/L Final         Passed - K in normal range and within 360 days    Potassium  Date Value Ref Range Status  11/13/2022 4.3 3.5 - 5.3 mmol/L Final         Passed - Na in normal range and within 360 days    Sodium  Date Value Ref Range Status  11/13/2022 138 135 - 146 mmol/L Final

## 2023-02-28 ENCOUNTER — Encounter: Payer: Self-pay | Admitting: Gastroenterology

## 2023-03-04 ENCOUNTER — Other Ambulatory Visit: Payer: Self-pay | Admitting: Nurse Practitioner

## 2023-03-04 DIAGNOSIS — K7030 Alcoholic cirrhosis of liver without ascites: Secondary | ICD-10-CM | POA: Diagnosis not present

## 2023-03-04 DIAGNOSIS — K7682 Hepatic encephalopathy: Secondary | ICD-10-CM | POA: Diagnosis not present

## 2023-03-04 DIAGNOSIS — K3189 Other diseases of stomach and duodenum: Secondary | ICD-10-CM

## 2023-03-04 DIAGNOSIS — K766 Portal hypertension: Secondary | ICD-10-CM | POA: Diagnosis not present

## 2023-03-19 ENCOUNTER — Encounter: Payer: 59 | Admitting: Gastroenterology

## 2023-04-08 ENCOUNTER — Ambulatory Visit
Admission: RE | Admit: 2023-04-08 | Discharge: 2023-04-08 | Disposition: A | Discharge status: Home / Self Care | Payer: 59 | Source: Ambulatory Visit | Attending: Nurse Practitioner | Admitting: Nurse Practitioner

## 2023-04-08 DIAGNOSIS — K7460 Unspecified cirrhosis of liver: Secondary | ICD-10-CM | POA: Diagnosis not present

## 2023-04-08 DIAGNOSIS — K7682 Hepatic encephalopathy: Secondary | ICD-10-CM

## 2023-04-08 DIAGNOSIS — K8020 Calculus of gallbladder without cholecystitis without obstruction: Secondary | ICD-10-CM | POA: Diagnosis not present

## 2023-04-08 DIAGNOSIS — K766 Portal hypertension: Secondary | ICD-10-CM

## 2023-04-08 DIAGNOSIS — K7030 Alcoholic cirrhosis of liver without ascites: Secondary | ICD-10-CM

## 2023-04-16 ENCOUNTER — Encounter: Payer: Self-pay | Admitting: Gastroenterology

## 2023-04-21 ENCOUNTER — Encounter: Payer: Self-pay | Admitting: Certified Registered Nurse Anesthetist

## 2023-04-24 ENCOUNTER — Encounter: Payer: 59 | Admitting: Gastroenterology

## 2023-05-21 ENCOUNTER — Telehealth: Payer: Self-pay | Admitting: Family Medicine

## 2023-05-21 NOTE — Telephone Encounter (Signed)
Paperwork received via eFax from [employer] to request additional medical information needed prior to making their decision about his requested disability leave.  Paperwork printed and placed on nurse's desk.   [employer] is requesting a reply by 05/24/23 or to be contacted at [phone].   Completed forms should be faxed back to them at [phone] (number on form)

## 2023-06-05 NOTE — Telephone Encounter (Signed)
 FMLA forms from [employer] placed in green folder. Thank you.

## 2023-06-07 ENCOUNTER — Encounter: Payer: Self-pay | Admitting: Family Medicine

## 2023-08-23 ENCOUNTER — Other Ambulatory Visit: Payer: Self-pay | Admitting: Nurse Practitioner

## 2023-08-23 DIAGNOSIS — K7030 Alcoholic cirrhosis of liver without ascites: Secondary | ICD-10-CM

## 2023-08-23 DIAGNOSIS — K7682 Hepatic encephalopathy: Secondary | ICD-10-CM

## 2023-08-23 DIAGNOSIS — K3189 Other diseases of stomach and duodenum: Secondary | ICD-10-CM

## 2023-08-27 ENCOUNTER — Ambulatory Visit
Admission: RE | Admit: 2023-08-27 | Discharge: 2023-08-27 | Disposition: A | Discharge status: Home / Self Care | Source: Ambulatory Visit | Attending: Nurse Practitioner | Admitting: Nurse Practitioner

## 2023-08-27 DIAGNOSIS — K7682 Hepatic encephalopathy: Secondary | ICD-10-CM

## 2023-08-27 DIAGNOSIS — K7030 Alcoholic cirrhosis of liver without ascites: Secondary | ICD-10-CM | POA: Diagnosis not present

## 2023-08-27 DIAGNOSIS — K3189 Other diseases of stomach and duodenum: Secondary | ICD-10-CM

## 2023-09-09 ENCOUNTER — Telehealth: Payer: Self-pay | Admitting: Family Medicine

## 2023-09-09 NOTE — Telephone Encounter (Signed)
 Copied from CRM [id number]. Topic: Appointments - Scheduling Inquiry for Clinic >> Sep 09, 2023 11:44 AM Zipporah Him wrote: Reason for CRM: [facility], wanted to get them completed in office here. States they are in his chart, was not able to see any outstanding orders for labs. Please call to advise. Requests speaking with Nanette.

## 2023-09-11 ENCOUNTER — Telehealth: Payer: Self-pay | Admitting: Family Medicine

## 2023-09-11 NOTE — Telephone Encounter (Signed)
 Copied from CRM [id number]. Topic: General - Other >> Sep 10, 2023  2:19 PM Tiffany S wrote: Reason for CRM: Patient is following up from message about labs please follow up with patient

## 2023-09-20 ENCOUNTER — Ambulatory Visit (INDEPENDENT_AMBULATORY_CARE_PROVIDER_SITE_OTHER): Admitting: Family Medicine

## 2023-09-20 ENCOUNTER — Encounter: Payer: Self-pay | Admitting: Family Medicine

## 2023-09-20 VITALS — BP 101/58 | HR 73 | Ht 68.0 in | Wt 158.0 lb

## 2023-09-20 DIAGNOSIS — K7030 Alcoholic cirrhosis of liver without ascites: Secondary | ICD-10-CM | POA: Diagnosis not present

## 2023-09-20 MED ORDER — FUROSEMIDE 20 MG PO TABS: 20.0000 mg | ORAL_TABLET | Freq: Every day | ORAL | 3 refills | Status: AC | PRN

## 2023-09-20 MED ORDER — CICLOPIROX 8 % EX SOLN: Freq: Every day | CUTANEOUS | 0 refills | Status: DC

## 2023-09-20 NOTE — Progress Notes (Signed)
 Subjective:    Patient ID: Christopher Carrillo, male    DOB: 1971-04-06, 53 y.o.   MRN: [id number]  HPI Patient is here today to recheck his liver test.  He states that he is doing well.  He continues to abstain from alcohol .  There is no jaundice.  There is no ascites.  He does have numerous varicose and spider veins on his abdomen on his legs and on the but otherwise he is doing well.  He has minimal swelling in both ankles.  He uses Lasix  once or twice a month for swelling.  Otherwise he is back to work and living his life.  He denies any chest pain or shortness of breath.  He denies any confusion or encephalopathy.  He recently got divorced.  Past Medical History:  Diagnosis Date   Abdominal pain    Alcoholic hepatitis without ascites    Anemia    Anxiety    Arthritis    Cirrhosis (HCC)    Elevated bilirubin    Essential hypertension    Psoriasis    Transaminitis    Past Surgical History:  Procedure Laterality Date   BIOPSY  07/20/2019   Procedure: BIOPSY;  Surgeon: Suzette Espy, MD;  [facility];  Service: Endoscopy;;   BIOPSY  08/14/2019   Procedure: BIOPSY;  Surgeon: Lindle Rhea, MD;  Location: [facility] ENDOSCOPY;  Service: Gastroenterology;;   COLONOSCOPY     COLONOSCOPY WITH PROPOFOL  N/A 07/20/2019   Procedure: COLONOSCOPY WITH PROPOFOL ;  Surgeon: Suzette Espy, MD;  [facility];  Service: Endoscopy;  Laterality: N/A;  10:15am   COLONOSCOPY WITH PROPOFOL  N/A 08/14/2019   Procedure: COLONOSCOPY WITH PROPOFOL ;  Surgeon: Lindle Rhea, MD;  Location: [facility] ENDOSCOPY;  Service: Gastroenterology;  Laterality: N/A;   IR PARACENTESIS  08/14/2019   IR PARACENTESIS  05/02/2021   POLYPECTOMY  07/20/2019   Procedure: POLYPECTOMY;  Surgeon: Suzette Espy, MD;  [facility];  Service: Endoscopy;;   SUBMUCOSAL TATTOO INJECTION  08/14/2019   Procedure: SUBMUCOSAL TATTOO INJECTION;  Surgeon: Lindle Rhea, MD;  Location: [facility] ENDOSCOPY;   Service: Gastroenterology;;   Current Outpatient Medications on File Prior to Visit  Medication Sig Dispense Refill   cyclobenzaprine  (FLEXERIL ) 10 MG tablet Take 1 tablet (10 mg total) by mouth 3 (three) times daily as needed for muscle spasms. 30 tablet 0   doxycycline  (VIBRA -TABS) 100 MG tablet Take 1 tablet (100 mg total) by mouth 2 (two) times daily. 14 tablet 0   dronabinol  (MARINOL ) 2.5 MG capsule Take 1 capsule (2.5 mg total) by mouth 2 (two) times daily before a meal. 60 capsule 3   feeding supplement (ENSURE ENLIVE / ENSURE PLUS) LIQD Take 237 mLs by mouth 3 (three) times daily between meals. 237 mL 12   furosemide  (LASIX ) 40 MG tablet Take 1 tablet (40 mg total) by mouth daily. 30 tablet 3   hydrOXYzine  (VISTARIL ) 25 MG capsule Take 1 capsule (25 mg total) by mouth at bedtime as needed (sleep). 30 capsule 0   lactulose  (CHRONULAC ) 10 GM/15ML solution TAKE 30 MLS (20 G TOTAL) BY MOUTH DAILY. 800 mL 2   potassium chloride  SA (KLOR-CON  M) 20 MEQ tablet TAKE 1 TABLET BY MOUTH EVERY DAY 30 tablet 0   sildenafil  (VIAGRA ) 100 MG tablet Take 0.5-1 tablets (50-100 mg total) by mouth daily as needed for erectile dysfunction. 5 tablet 11   spironolactone  (ALDACTONE ) 25 MG tablet Take 1 tablet (25 mg total) by mouth  daily. 30 tablet 5   traMADol  (ULTRAM ) 50 MG tablet TAKE 1 TABLET(50 MG) BY MOUTH EVERY 6 HOURS AS NEEDED 30 tablet 0   triamcinolone  cream (KENALOG ) 0.1 % Apply 1 Application topically 2 (two) times daily. 30 g 0   No current facility-administered medications on file prior to visit.   No Known Allergies  Social History   Socioeconomic History   Marital status: Married    Spouse name: Not on file   Number of children: Not on file   Years of education: Not on file   Highest education level: GED or equivalent  Occupational History   Not on file  Tobacco Use   Smoking status: Former    Current packs/day: 0.33    Types: Cigarettes   Smokeless tobacco: Never  Vaping Use    Vaping status: Never Used  Substance and Sexual Activity   Alcohol  use: Not Currently    Comment: havent had anything since  early 2022   Drug use: No   Sexual activity: Yes  Other Topics Concern   Not on file  Social History Narrative   Not on file   Social Drivers of Health   Financial Resource Strain: High Risk (11/12/2022)   Overall Financial Resource Strain (CARDIA)    Difficulty of Paying Living Expenses: Hard  Food Insecurity: Low Risk  (03/04/2023)   Received from [facility]   Hunger Vital Sign    Within the past 12 months, you worried that your food would run out before you got money to buy more: Never true    Within the past 12 months, the food you bought just didn't last and you didn't have money to get more. : Never true  Transportation Needs: No Transportation Needs (03/04/2023)   Received from [employer]    In the past 12 months, has lack of reliable transportation kept you from medical appointments, meetings, work or from getting things needed for daily living? : No  Physical Activity: Insufficiently Active (11/12/2022)   Exercise Vital Sign    Days of Exercise per Week: 4 days    Minutes of Exercise per Session: 30 min  Stress: No Stress Concern Present (11/12/2022)   [employer] of Occupational Health - Occupational Stress Questionnaire    Feeling of Stress : Only a little  Social Connections: Unknown (11/12/2022)   Social Connection and Isolation Panel    Frequency of Communication with Friends and Family: Once a week    Frequency of Social Gatherings with Friends and Family: Not on file    Attends Religious Services: Not on file    Active Member of Clubs or Organizations: No    Attends Banker Meetings: Not on file    Marital Status: Not on file  Intimate Partner Violence: Not on file     Review of Systems     Objective:   Physical Exam Vitals reviewed.  Constitutional:      General: He is not in acute distress.     Appearance: Normal appearance. He is normal weight. He is not ill-appearing, toxic-appearing or diaphoretic.   Eyes:     General: No scleral icterus.   Cardiovascular:     Rate and Rhythm: Normal rate and regular rhythm.     Heart sounds: Normal heart sounds. No murmur heard.    No friction rub. No gallop.  Pulmonary:     Effort: Pulmonary effort is normal. No respiratory distress.     Breath sounds: Normal  breath sounds. No stridor. No wheezing, rhonchi or rales.  Abdominal:     General: Abdomen is flat. Bowel sounds are normal. There is no distension.     Palpations: Abdomen is soft.     Tenderness: There is no abdominal tenderness. There is no guarding or rebound.   Musculoskeletal:     Right lower leg: No edema.     Left lower leg: No edema.   Skin:    Coloration: Skin is not jaundiced or pale.   Neurological:     General: No focal deficit present.     Mental Status: He is alert and oriented to person, place, and time. Mental status is at baseline.   Psychiatric:        Attention and Perception: Attention normal.        Mood and Affect: Mood is not depressed. Affect is not tearful.        Speech: Speech normal.        Behavior: Behavior normal.        Thought Content: Thought content normal.        Cognition and Memory: Cognition and memory normal.           Assessment & Plan:  Alcoholic cirrhosis of liver without ascites (HCC) - Plan: CBC with Differential/Platelet, Comprehensive metabolic panel with GFR, PT with INR/Fingerstick, Ethanol, Clinical, Protime-INR I congratulated the patient on his continued sobriety and abstinence from alcohol .  I will check a CBC a CMP a PT/INR and ethanol..  Patient is doing well and is otherwise asymptomatic.  Encouraged him to keep up the good work.

## 2023-09-21 LAB — PROTIME-INR
INR: 1.2 — ABNORMAL HIGH
Prothrombin Time: 12.6 s — ABNORMAL HIGH (ref 9.0–11.5)

## 2023-09-23 ENCOUNTER — Ambulatory Visit: Payer: Self-pay | Admitting: Family Medicine

## 2023-09-23 LAB — CBC WITH DIFFERENTIAL/PLATELET
Absolute Lymphocytes: 1791 {cells}/uL (ref 850–3900)
Absolute Monocytes: 536 {cells}/uL (ref 200–950)
Basophils Absolute: 41 {cells}/uL (ref 0–200)
Basophils Relative: 0.9 %
Eosinophils Absolute: 252 {cells}/uL (ref 15–500)
Eosinophils Relative: 5.6 %
HCT: 35.1 % — ABNORMAL LOW (ref 38.5–50.0)
Hemoglobin: 11.7 g/dL — ABNORMAL LOW (ref 13.2–17.1)
MCH: 30.5 pg (ref 27.0–33.0)
MCHC: 33.3 g/dL (ref 32.0–36.0)
MCV: 91.4 fL (ref 80.0–100.0)
MPV: 10.8 fL (ref 7.5–12.5)
Monocytes Relative: 11.9 %
Neutro Abs: 1881 {cells}/uL (ref 1500–7800)
Neutrophils Relative %: 41.8 %
Platelets: 102 10*3/uL — ABNORMAL LOW (ref 140–400)
RBC: 3.84 10*6/uL — ABNORMAL LOW (ref 4.20–5.80)
RDW: 14.1 % (ref 11.0–15.0)
Total Lymphocyte: 39.8 %
WBC: 4.5 10*3/uL (ref 3.8–10.8)

## 2023-09-23 LAB — COMPREHENSIVE METABOLIC PANEL WITH GFR
AG Ratio: 2.3 (calc) (ref 1.0–2.5)
ALT: 21 U/L (ref 9–46)
AST: 36 U/L — ABNORMAL HIGH (ref 10–35)
Albumin: 3.7 g/dL (ref 3.6–5.1)
Alkaline phosphatase (APISO): 132 U/L (ref 35–144)
BUN: 18 mg/dL (ref 7–25)
CO2: 24 mmol/L (ref 20–32)
Calcium: 8.8 mg/dL (ref 8.6–10.3)
Chloride: 110 mmol/L (ref 98–110)
Creat: 0.97 mg/dL (ref 0.70–1.30)
Globulin: 1.6 g/dL — ABNORMAL LOW (ref 1.9–3.7)
Glucose, Bld: 84 mg/dL (ref 65–99)
Potassium: 4.1 mmol/L (ref 3.5–5.3)
Sodium: 141 mmol/L (ref 135–146)
Total Bilirubin: 1.6 mg/dL — ABNORMAL HIGH (ref 0.2–1.2)
Total Protein: 5.3 g/dL — ABNORMAL LOW (ref 6.1–8.1)
eGFR: 95 mL/min/{1.73_m2} (ref >=60)

## 2023-09-23 LAB — ETHANOL, CLINICAL
Alcohol, Ethyl (B): NOT DETECTED g/dL
Alcohol, Ethyl (B): NOT DETECTED mg/dL

## 2023-09-24 ENCOUNTER — Ambulatory Visit: Admitting: Family Medicine

## 2023-10-07 ENCOUNTER — Telehealth: Payer: Self-pay

## 2023-10-07 NOTE — Telephone Encounter (Signed)
 Got it, thanks Jan. He is due for routine office follow up with me if you can help coordinate. Thanks

## 2023-10-07 NOTE — Telephone Encounter (Signed)
-----   Message from CMA Alan HERO sent at 09/30/2023  9:55 AM EDT ----- Regarding: FW: RUQ due in July  ----- Message ----- From: Edmundo Clarita HERO, CMA Sent: 09/30/2023  12:00 AM EDT To: Clarita HERO Edmundo, CMA Subject: RUQ due in July                                Repeat RUQ U/S due in 10-2023 (dx cirrhosis)

## 2023-10-07 NOTE — Telephone Encounter (Signed)
 FYI - Patient had RUQ U/S in May for [facility]. Please advise.

## 2023-10-08 NOTE — Telephone Encounter (Unsigned)
 Copied from CRM [id number]. Topic: General - Other >> Oct 08, 2023  4:19 PM Sophia H wrote: Reason for CRM: Patient is requesting to speak with Ronal Bradley regarding medications he is taking, please reach out # [phone]

## 2023-10-16 ENCOUNTER — Other Ambulatory Visit: Payer: Self-pay | Admitting: Family Medicine

## 2023-12-31 ENCOUNTER — Ambulatory Visit (INDEPENDENT_AMBULATORY_CARE_PROVIDER_SITE_OTHER): Admitting: Family Medicine

## 2023-12-31 ENCOUNTER — Encounter: Payer: Self-pay | Admitting: Family Medicine

## 2023-12-31 VITALS — BP 120/62 | HR 76 | Temp 97.7°F | Ht 68.0 in | Wt 156.0 lb

## 2023-12-31 DIAGNOSIS — R252 Cramp and spasm: Secondary | ICD-10-CM

## 2023-12-31 DIAGNOSIS — Z3009 Encounter for other general counseling and advice on contraception: Secondary | ICD-10-CM

## 2023-12-31 NOTE — Progress Notes (Signed)
 Subjective:    Patient ID: Christopher Carrillo, male    DOB: 09/12/1970, 53 y.o.   MRN: [id number]  HPI   Patient recently started a high protein bar restriction diet similar to [place].  He lost a fair amount of weight.  I suspect that he lost water weight.  He stated that he noticed the swelling in his hands and feet completely went away.  He has stopped taking his fluid pills as result.  He also has started more vigorous exercise due to a new relationship.  Around the same time he started experiencing nocturnal cramps in his legs.  They are primarily in his hamstrings and his calves.  It also occasionally occurs in the daytime.  He is also having some cramp-like pain in his abdomen at times.  Past Medical History:  Diagnosis Date   Abdominal pain    Alcoholic hepatitis without ascites    Anemia    Anxiety    Arthritis    Cirrhosis (HCC)    Elevated bilirubin    Essential hypertension    Psoriasis    Transaminitis    Past Surgical History:  Procedure Laterality Date   BIOPSY  07/20/2019   Procedure: BIOPSY;  Surgeon: Shaaron Lamar HERO, MD;  Location: [facility];  Service: Endoscopy;;   BIOPSY  08/14/2019   Procedure: BIOPSY;  Surgeon: Eda Iha, MD;  Location: [facility] ENDOSCOPY;  Service: Gastroenterology;;   COLONOSCOPY     COLONOSCOPY WITH PROPOFOL  N/A 07/20/2019   Procedure: COLONOSCOPY WITH PROPOFOL ;  Surgeon: Shaaron Lamar HERO, MD;  Location: [facility];  Service: Endoscopy;  Laterality: N/A;  10:15am   COLONOSCOPY WITH PROPOFOL  N/A 08/14/2019   Procedure: COLONOSCOPY WITH PROPOFOL ;  Surgeon: Eda Iha, MD;  Location: [facility] ENDOSCOPY;  Service: Gastroenterology;  Laterality: N/A;   IR PARACENTESIS  08/14/2019   IR PARACENTESIS  05/02/2021   POLYPECTOMY  07/20/2019   Procedure: POLYPECTOMY;  Surgeon: Shaaron Lamar HERO, MD;  Location: [facility];  Service: Endoscopy;;   SUBMUCOSAL TATTOO INJECTION  08/14/2019   Procedure: SUBMUCOSAL TATTOO INJECTION;  Surgeon:  Eda Iha, MD;  Location: [facility] ENDOSCOPY;  Service: Gastroenterology;;   Current Outpatient Medications on File Prior to Visit  Medication Sig Dispense Refill   ciclopirox  (PENLAC ) 8 % solution Apply topically at bedtime. Apply over nail and surrounding skin. Apply daily over previous coat. After seven (7) days, may remove with alcohol  and continue cycle. 6.6 mL 0   cyclobenzaprine  (FLEXERIL ) 10 MG tablet Take 1 tablet (10 mg total) by mouth 3 (three) times daily as needed for muscle spasms. 30 tablet 0   dronabinol  (MARINOL ) 2.5 MG capsule Take 1 capsule (2.5 mg total) by mouth 2 (two) times daily before a meal. 60 capsule 3   feeding supplement (ENSURE ENLIVE / ENSURE PLUS) LIQD Take 237 mLs by mouth 3 (three) times daily between meals. 237 mL 12   furosemide  (LASIX ) 20 MG tablet Take 1 tablet (20 mg total) by mouth daily as needed. 30 tablet 3   hydrOXYzine  (VISTARIL ) 25 MG capsule Take 1 capsule (25 mg total) by mouth at bedtime as needed (sleep). 30 capsule 0   lactulose  (CHRONULAC ) 10 GM/15ML solution TAKE 30 MLS (20 G TOTAL) BY MOUTH DAILY. 800 mL 2   potassium chloride  SA (KLOR-CON  M) 20 MEQ tablet TAKE 1 TABLET BY MOUTH EVERY DAY 30 tablet 0   sildenafil  (VIAGRA ) 100 MG tablet TAKE 0.5-1 TABLETS BY MOUTH DAILY AS NEEDED FOR ERECTILE DYSFUNCTION. 5 tablet  11   spironolactone  (ALDACTONE ) 25 MG tablet Take 1 tablet (25 mg total) by mouth daily. (Patient not taking: Reported on 09/20/2023) 30 tablet 5   traMADol  (ULTRAM ) 50 MG tablet TAKE 1 TABLET(50 MG) BY MOUTH EVERY 6 HOURS AS NEEDED 30 tablet 0   triamcinolone  cream (KENALOG ) 0.1 % Apply 1 Application topically 2 (two) times daily. 30 g 0   No current facility-administered medications on file prior to visit.   No Known Allergies  Social History   Socioeconomic History   Marital status: Married    Spouse name: Not on file   Number of children: Not on file   Years of education: Not on file   Highest education level: GED or  equivalent  Occupational History   Not on file  Tobacco Use   Smoking status: Former    Current packs/day: 0.33    Types: Cigarettes   Smokeless tobacco: Never  Vaping Use   Vaping status: Never Used  Substance and Sexual Activity   Alcohol  use: Not Currently    Comment: havent had anything since  early 2022   Drug use: No   Sexual activity: Yes  Other Topics Concern   Not on file  Social History Narrative   Not on file   Social Drivers of Health   Financial Resource Strain: High Risk (11/12/2022)   Overall Financial Resource Strain (CARDIA)    Difficulty of Paying Living Expenses: Hard  Food Insecurity: Low Risk  (03/04/2023)   Received from [facility]   Hunger Vital Sign    Within the past 12 months, you worried that your food would run out before you got money to buy more: Never true    Within the past 12 months, the food you bought just didn't last and you didn't have money to get more. : Never true  Transportation Needs: No Transportation Needs (03/04/2023)   Received from [facility]    In the past 12 months, has lack of reliable transportation kept you from medical appointments, meetings, work or from getting things needed for daily living? : No  Physical Activity: Insufficiently Active (11/12/2022)   Exercise Vital Sign    Days of Exercise per Week: 4 days    Minutes of Exercise per Session: 30 min  Stress: No Stress Concern Present (11/12/2022)   [employer] of Occupational Health - Occupational Stress Questionnaire    Feeling of Stress : Only a little  Social Connections: Unknown (11/12/2022)   Social Connection and Isolation Panel    Frequency of Communication with Friends and Family: Once a week    Frequency of Social Gatherings with Friends and Family: Not on file    Attends Religious Services: Not on file    Active Member of Clubs or Organizations: No    Attends Banker Meetings: Not on file    Marital Status: Not on file   Intimate Partner Violence: Not on file     Review of Systems     Objective:   Physical Exam Vitals reviewed.  Constitutional:      General: He is not in acute distress.    Appearance: Normal appearance. He is normal weight. He is not ill-appearing, toxic-appearing or diaphoretic.  Eyes:     General: No scleral icterus. Cardiovascular:     Rate and Rhythm: Normal rate and regular rhythm.     Heart sounds: Normal heart sounds. No murmur heard.    No friction rub. No gallop.  Pulmonary:  Effort: Pulmonary effort is normal. No respiratory distress.     Breath sounds: Normal breath sounds. No stridor. No wheezing, rhonchi or rales.  Abdominal:     General: Abdomen is flat. Bowel sounds are normal. There is no distension.     Palpations: Abdomen is soft.     Tenderness: There is no abdominal tenderness. There is no guarding or rebound.  Musculoskeletal:     Right lower leg: No edema.     Left lower leg: No edema.  Skin:    Coloration: Skin is not jaundiced or pale.  Neurological:     General: No focal deficit present.     Mental Status: He is alert and oriented to person, place, and time. Mental status is at baseline.  Psychiatric:        Attention and Perception: Attention normal.        Mood and Affect: Mood is not depressed. Affect is not tearful.        Speech: Speech normal.        Behavior: Behavior normal.        Thought Content: Thought content normal.        Cognition and Memory: Cognition and memory normal.           Assessment & Plan:  Cramps of lower extremity - Plan: Comprehensive metabolic panel with GFR, Magnesium , CBC with Differential/Platelet  Vasectomy evaluation - Plan: Ambulatory referral to Urology Due to cirrhosis, I suspect the patient likely has intravascular volume depletion as a baseline due to low albumin .  Given his low carb diet, I think he has lost water weight and is effectively dehydrated.  Coupled that with the increase of aerobic  exercise recently and I believe he is developing cramps.  Recommended more adequate hydration and also adding back some carbs to his diet to avoid dehydration.  Check baseline electrolytes as well.  The patient would also like a referral to urology for vasectomy

## 2024-01-01 LAB — COMPREHENSIVE METABOLIC PANEL WITH GFR
AG Ratio: 2 (calc) (ref 1.0–2.5)
ALT: 27 U/L (ref 9–46)
AST: 81 U/L — ABNORMAL HIGH (ref 10–35)
Albumin: 3.8 g/dL (ref 3.6–5.1)
Alkaline phosphatase (APISO): 147 U/L — ABNORMAL HIGH (ref 35–144)
BUN: 21 mg/dL (ref 7–25)
CO2: 24 mmol/L (ref 20–32)
Calcium: 8.6 mg/dL (ref 8.6–10.3)
Chloride: 110 mmol/L (ref 98–110)
Creat: 1.14 mg/dL (ref 0.70–1.30)
Globulin: 1.9 g/dL (ref 1.9–3.7)
Glucose, Bld: 99 mg/dL (ref 65–99)
Potassium: 3.8 mmol/L (ref 3.5–5.3)
Sodium: 140 mmol/L (ref 135–146)
Total Bilirubin: 1.3 mg/dL — ABNORMAL HIGH (ref 0.2–1.2)
Total Protein: 5.7 g/dL — ABNORMAL LOW (ref 6.1–8.1)
eGFR: 78 mL/min/1.73m2 (ref >=60)

## 2024-01-01 LAB — CBC WITH DIFFERENTIAL/PLATELET
Absolute Lymphocytes: 1454 {cells}/uL (ref 850–3900)
Absolute Monocytes: 563 {cells}/uL (ref 200–950)
Basophils Absolute: 41 {cells}/uL (ref 0–200)
Basophils Relative: 0.9 %
Eosinophils Absolute: 189 {cells}/uL (ref 15–500)
Eosinophils Relative: 4.2 %
HCT: 35.1 % — ABNORMAL LOW (ref 38.5–50.0)
Hemoglobin: 12.4 g/dL — ABNORMAL LOW (ref 13.2–17.1)
MCH: 31.7 pg (ref 27.0–33.0)
MCHC: 35.3 g/dL (ref 32.0–36.0)
MCV: 89.8 fL (ref 80.0–100.0)
MPV: 10.3 fL (ref 7.5–12.5)
Monocytes Relative: 12.5 %
Neutro Abs: 2255 {cells}/uL (ref 1500–7800)
Neutrophils Relative %: 50.1 %
Platelets: 118 Thousand/uL — ABNORMAL LOW (ref 140–400)
RBC: 3.91 Million/uL — ABNORMAL LOW (ref 4.20–5.80)
RDW: 14.3 % (ref 11.0–15.0)
Total Lymphocyte: 32.3 %
WBC: 4.5 Thousand/uL (ref 3.8–10.8)

## 2024-01-01 LAB — MAGNESIUM: Magnesium: 1.6 mg/dL (ref 1.5–2.5)

## 2024-01-02 ENCOUNTER — Ambulatory Visit: Payer: Self-pay | Admitting: Family Medicine

## 2024-01-31 ENCOUNTER — Ambulatory Visit: Admitting: Urology

## 2024-02-05 ENCOUNTER — Encounter: Payer: Self-pay | Admitting: Urology

## 2024-02-05 ENCOUNTER — Ambulatory Visit: Admitting: Urology

## 2024-02-05 VITALS — BP 134/73 | HR 74 | Ht 68.0 in | Wt 158.0 lb

## 2024-02-05 DIAGNOSIS — Z3009 Encounter for other general counseling and advice on contraception: Secondary | ICD-10-CM

## 2024-02-05 DIAGNOSIS — N529 Male erectile dysfunction, unspecified: Secondary | ICD-10-CM

## 2024-02-05 MED ORDER — TADALAFIL 20 MG PO TABS
20.0000 mg | ORAL_TABLET | Freq: Every day | ORAL | 11 refills | Status: AC | PRN
Start: 2024-02-05 — End: ?

## 2024-02-05 NOTE — Patient Instructions (Signed)
 Vasectomy is a safe, simple, and effective office based procedure that provides men with permanent sterility.  The no scalpel technique has been a refinement but often results in less swelling and pain than the traditional vasectomy method.  Prior to a vasectomy, it is important to make a decision that you are interested in permanent sterility and that you and your partner must be completely sure that you do not want children in the future.  To prepare for the procedure, stop taking any aspirin or blood thinners for 1 week prior to the procedure.  The day of your procedure take a shower and thoroughly clean your scrotum.  It is not necessary to shave prior to the procedure as any shaving that is needed will be performed in the office.  Bring a pair of tight underwear such as briefs, boxer briefs, or athletic shorts with you for the day of the procedure.  You may eat a light meal prior to the procedure.  During the procedure, the scrotal skin will be sterilized completely.  Anesthesia will be provided by injection of a local anesthetic into the scrotum which will provide anesthesia for 2-3 hours after the procedure.  Once the local anesthetic takes effect, a tiny puncture is made in the middle of the front of the scrotum through which both vasa deferens tubes can be partially removed and the ends of the tubes cauterized.  A small absorbable suture is placed in the skin incision.  Antibiotic ointment and sterile gauze dressings are applied and are held in place with the undergarment.  Prescriptions for an antibiotic and pain medication will be sent to your pharmacy.  The antibiotic should be taken to completion.  The pain medication can be taken as needed every 4-6 hours.  If a narcotic pain medication is too strong, over-the-counter analgesics such as Tylenol or ibuprofen may be taken instead.  After the procedure, it is important to take it easy for a couple of days and apply the ice pack to the scrotal area.   The ice pack goes on top of the undergarment and should be used for 10-15 minutes at a time while you are awake.  After the first 2 days, you can gradually increase your activity.  During the first week, it is important to avoid strenuous activity or activity that puts pressure in the scrotal area.  It is also advisable to restrain from heavy lifting or long distance running during this time period.  Often, supportive underwear is helpful to reduce pain during the first week.  You should also avoid sexual activity for 10 days.  You can resume normal activities after 1 week and sexual relations in 10 days.  One of the most important considerations after vasectomy is that you are still fertile after the procedure.  It generally takes at least 20 ejaculations and 3 months time before you are considered sterile.  Even 3 months after the procedure, some men will have a few persistent sperm present.  To be considered sterile, you will need to produce a semen sample that shows no sperm.  You will receive instructions to bring in your first semen specimen to the office 3 months after the procedure.  It is very important that you continue to use your current method of birth control during this time as it is possible to achieve a pregnancy until you become sterile.  Potential complications of vasectomy include bleeding (less than 1% risk of serious bleeding), infection (less than 1%), reconnection of  the vas deferens (04/998 chance), pregnancy (04/1998 chance), shrinkage of the testicle (04/4998 chance), and chronic pain (2-4%).  Other potential consequences of vasectomy include sperm granuloma (a small round area of scar tissue in the area of the procedure is performed), and congestion of the epididymis (a fullness of the tubes where sperm are stored).  Sperm will continue to be produced by the testicles, but the sperm will eventually die and be absorbed by the body.  The amount of semen that is produced will not change.   The main difference is that the semen will not contain sperm after a man has become sterile.  The procedure does not affect your urination, sex drive, or erections.  In summary, vasectomy provides permanent birth control for men.  It is an office-based procedure which is safe, effective, and economical.  After the procedure, it takes time to become sterile so proper precautions must be taken until sterility is achieved.    Taking care of yourself after a VASECTOMY                                              Patient Information Sheet        The following information will reinforce some of the instructions that your doctor has given you.  Day of Procedure: 1) Wear the scrotal supporter and gauze pad 2) Use an ice pack on the scrotum for 15 minutes every hour for 48 hours to help reduce discomfort, swelling and bruising (do NOT place ice directly on your skin, but place on top of the supporter) 3) Expect some clear to pinkish drainage at the surgical site for the first 24-48 hours 4) If needed, use pain medications provided or ibuprofen 800 mg every 8 hours for discomfort 5) Avoid strenuous activities like mowing, lifting, jogging and exercising for 1 week.  Take it easy! 6) If you develop a fever over 101 F or sudden onset of significant swelling within the first 12 hours, please call to report this to your doctor as soon as possible.     Day Two and Three: 1) You may take a shower, but avoid tubs, pools or hot tubs. 2) Continue to wear the scrotal supporter as needed for comfort and change or remove the gauze pad if desired 3) Keep taking it easy!  Avoid strenuous activities like mowing, lifting, jogging and exercising.   4) Continue to watch for signs or symptoms of fever or significant swelling 5) Apply a small amount of antibiotic ointment to incision 1-2 times/day  The rest of the week: 1) Gradually return to normal physical activities after one week.  A return of soreness might  mean you are        "doing too much too soon". 2) Avoid sexual activity for 10 days after the procedure 3) Continue to take a shower, but avoid tubs, pools or hot tubs 4) Wearing the scrotal supporter is optional based on your comfort.     Remember to use an alternate form of contraception for 3 months until you have been checked and CLEARED by your urologist!  62-Month lab appointment:  1) The lab technician will need to look at a semen sample under a microscope  2) Use the specimen cup provided to collect the sample AT HOME 1 hour before the appointment  3) DO NOT refrigerate the specimen, but keep  at room or body temperature  4) Avoid ejaculation for 2-5 days before collecting the specimen  5) Collect the entire specimen by masturbation using NO lubricant  6) Make sure your name, MR number, date and time of collection are on the cup

## 2024-02-05 NOTE — Progress Notes (Signed)
 Assessment: 1. Encounter for vasectomy assessment   2. Erectile dysfunction, unspecified erectile dysfunction type     Plan: Need to discuss his coagulopathy with Dr. Duanne prior to scheduling vasectomy. He has a history of increased bleeding and has had abnormal coag studies as recently as June 2025. Prescription for tadalafil 20 mg as needed provided.  Chief Complaint:  Chief Complaint  Patient presents with   VAS Consult    History of Present Illness:  Christopher Carrillo is a 53 y.o. male who is seen for vasectomy evaluation. He is divorced with 1 child.  He is currently in a relationship with a 52 year old male.  He also reports problems achieving an adequate erection for intercourse.  No pain or discomfort with erection.  He has tried sildenafil  with minimal improvement.  Past Medical History:  Past Medical History:  Diagnosis Date   Abdominal pain    Alcoholic hepatitis without ascites    Anemia    Anxiety    Arthritis    Cirrhosis (HCC)    Elevated bilirubin    Essential hypertension    Psoriasis    Transaminitis     Past Surgical History:  Past Surgical History:  Procedure Laterality Date   BIOPSY  07/20/2019   Procedure: BIOPSY;  Surgeon: Shaaron Lamar HERO, MD;  [facility];  Service: Endoscopy;;   BIOPSY  08/14/2019   Procedure: BIOPSY;  Surgeon: Eda Iha, MD;  Location: [facility] ENDOSCOPY;  Service: Gastroenterology;;   COLONOSCOPY     COLONOSCOPY WITH PROPOFOL  N/A 07/20/2019   Procedure: COLONOSCOPY WITH PROPOFOL ;  Surgeon: Shaaron Lamar HERO, MD;  Location: [facility];  Service: Endoscopy;  Laterality: N/A;  10:15am   COLONOSCOPY WITH PROPOFOL  N/A 08/14/2019   Procedure: COLONOSCOPY WITH PROPOFOL ;  Surgeon: Eda Iha, MD;  Location: [facility] ENDOSCOPY;  Service: Gastroenterology;  Laterality: N/A;   IR PARACENTESIS  08/14/2019   IR PARACENTESIS  05/02/2021   POLYPECTOMY  07/20/2019   Procedure: POLYPECTOMY;  Surgeon: Shaaron Lamar HERO, MD;  Location: [facility];  Service: Endoscopy;;   SUBMUCOSAL TATTOO INJECTION  08/14/2019   Procedure: SUBMUCOSAL TATTOO INJECTION;  Surgeon: Eda Iha, MD;  Location: [facility] ENDOSCOPY;  Service: Gastroenterology;;    Allergies:  No Known Allergies  Family History:  Family History  Problem Relation Age of Onset   Asthma Father    Cancer Sister 61       Pharyngeal   Colon cancer Neg Hx    Liver disease Neg Hx    Esophageal cancer Neg Hx    Rectal cancer Neg Hx    Stomach cancer Neg Hx     Social History:  Social History   Tobacco Use   Smoking status: Former    Current packs/day: 0.33    Types: Cigarettes   Smokeless tobacco: Never  Vaping Use   Vaping status: Never Used  Substance Use Topics   Alcohol  use: Not Currently    Comment: havent had anything since  early 2022   Drug use: No    Review of symptoms:  Constitutional:  Negative for unexplained weight loss, night sweats, fever, chills ENT:  Negative for nose bleeds, sinus pain, painful swallowing CV:  Negative for chest pain, shortness of breath, exercise intolerance, palpitations, loss of consciousness Resp:  Negative for cough, wheezing, shortness of breath GI:  Negative for nausea, vomiting, diarrhea, bloody stools GU:  Positives noted in HPI; otherwise negative for gross hematuria, dysuria, urinary incontinence Neuro:  Negative for seizures, poor  balance, limb weakness, slurred speech Psych:  Negative for lack of energy, depression, anxiety Endocrine:  Negative for polydipsia, polyuria, symptoms of hypoglycemia (dizziness, hunger, sweating) Hematologic:  Negative for anemia, purpura, petechia, prolonged or excessive bleeding, use of anticoagulants  Allergic:  Negative for difficulty breathing or choking as a result of exposure to anything; no shellfish allergy; no allergic response (rash/itch) to materials, foods  Physical exam: BP 134/73   Pulse 74   Ht 5' 8 (1.727 m)   Wt 158 lb (71.7 kg)    BMI 24.02 kg/m  GENERAL APPEARANCE:  Well appearing, well developed, well nourished, NAD HEENT:  Atraumatic, normocephalic, oropharynx clear NECK:  Supple without lymphadenopathy or thyromegaly ABDOMEN:  Soft, non-tender, no masses EXTREMITIES:  Moves all extremities well, without clubbing, cyanosis, or edema NEUROLOGIC:  Alert and oriented x 3, normal gait, CN II-XII grossly intact MENTAL STATUS:  appropriate BACK:  Non-tender to palpation, No CVAT SKIN:  Warm, dry, and intact GU: Penis:  circumcised Meatus: Normal Scrotum: vas palpated bilaterally Testis: normal without masses bilateral  Results: None  VASECTOMY CONSULTATION  Christopher Carrillo presents for vasectomy consultation today.  He is a 53 y.o. male, Divorced with 1 child.  He and his wife have discussed the issues regarding long-term fertility and are comfortable with this decision.  He presents for consideration for vasectomy.  I discussed the issues in detail with him today and he expressed no reservations.  As to the procedure, no scalpel technique vasectomy is explained and reviewed in detail.  Generalized risks including but not limited to bleeding, infection, orchalgia, testicular atrophy, epididymitis, scrotal hematoma, and chronic pain are discussed.   Additionally, he understands that the possibility of vas recanalization following vasectomy is possible although rare.  Most importantly, the patient understands that he is not sterile initially and will need a semen analysis check to confirm sterility such that no sperm are seen.  He is advised to avoid ejaculation for 10 days following the procedure.  The initial semen analysis will be checked in approximately 12 weeks and in some patients, several months may be required for clearance of all sperm.  He reports a clear understanding of the need for continued birth control until sterility is confirmed.  Otherwise, general issues regarding local anesthesia, prep, alprazolam are  discussed and he reports a clear understanding.

## 2024-02-11 ENCOUNTER — Encounter: Payer: Self-pay | Admitting: Urology

## 2024-02-26 ENCOUNTER — Telehealth: Payer: Self-pay | Admitting: Urology

## 2024-02-26 ENCOUNTER — Telehealth: Payer: Self-pay

## 2024-02-26 NOTE — Telephone Encounter (Signed)
 Patient called and lmovm that he wanted to schedule an appointment. Did not say what kind. I called him back and left a message for him to call th eoffice. Christopher Carrillo

## 2024-02-26 NOTE — Telephone Encounter (Signed)
 Pt needed clarification on if Dr. Roseann would perform vasectomy. Advised pt information documented below by Dr. Roseann. Pt verbalized understanding of all. Mjp,lpn  Stoneking, Adine PARAS., MD to [facility]     02/11/24  1:31 PM I contacted Dr. Duanne.  He feels like you should be OK to have the vasectomy procedure. If you would like to schedule this, please contact the office.

## 2024-03-02 ENCOUNTER — Other Ambulatory Visit: Payer: Self-pay | Admitting: Nurse Practitioner

## 2024-03-02 DIAGNOSIS — R748 Abnormal levels of other serum enzymes: Secondary | ICD-10-CM

## 2024-03-02 DIAGNOSIS — K7682 Hepatic encephalopathy: Secondary | ICD-10-CM

## 2024-03-02 DIAGNOSIS — K3189 Other diseases of stomach and duodenum: Secondary | ICD-10-CM

## 2024-03-09 DIAGNOSIS — K7030 Alcoholic cirrhosis of liver without ascites: Secondary | ICD-10-CM | POA: Diagnosis not present

## 2024-03-09 DIAGNOSIS — R252 Cramp and spasm: Secondary | ICD-10-CM | POA: Diagnosis not present

## 2024-03-09 DIAGNOSIS — K7682 Hepatic encephalopathy: Secondary | ICD-10-CM | POA: Diagnosis not present

## 2024-03-09 DIAGNOSIS — K766 Portal hypertension: Secondary | ICD-10-CM | POA: Diagnosis not present

## 2024-03-09 DIAGNOSIS — K3189 Other diseases of stomach and duodenum: Secondary | ICD-10-CM | POA: Diagnosis not present

## 2024-03-09 DIAGNOSIS — R748 Abnormal levels of other serum enzymes: Secondary | ICD-10-CM | POA: Diagnosis not present

## 2024-03-17 ENCOUNTER — Inpatient Hospital Stay: Admission: RE | Admit: 2024-03-17 | Discharge: 2024-03-17 | Attending: Nurse Practitioner

## 2024-03-17 DIAGNOSIS — K7682 Hepatic encephalopathy: Secondary | ICD-10-CM

## 2024-03-17 DIAGNOSIS — K8020 Calculus of gallbladder without cholecystitis without obstruction: Secondary | ICD-10-CM | POA: Diagnosis not present

## 2024-03-17 DIAGNOSIS — R748 Abnormal levels of other serum enzymes: Secondary | ICD-10-CM

## 2024-03-17 DIAGNOSIS — K3189 Other diseases of stomach and duodenum: Secondary | ICD-10-CM

## 2024-04-16 ENCOUNTER — Encounter: Admitting: Urology
